# Patient Record
Sex: Male | Born: 1940 | Race: White | Hispanic: No | Marital: Married | Smoking: Former smoker
Health system: Southern US, Community
[De-identification: ages and names within clinical notes are randomized; demographics above are authoritative.]

## PROBLEM LIST (undated history)

## (undated) DIAGNOSIS — G20 Parkinson's disease: Secondary | ICD-10-CM

## (undated) DIAGNOSIS — F32A Depression, unspecified: Secondary | ICD-10-CM

## (undated) DIAGNOSIS — N179 Acute kidney failure, unspecified: Secondary | ICD-10-CM

## (undated) DIAGNOSIS — F17200 Nicotine dependence, unspecified, uncomplicated: Secondary | ICD-10-CM

## (undated) DIAGNOSIS — E1142 Type 2 diabetes mellitus with diabetic polyneuropathy: Secondary | ICD-10-CM

## (undated) DIAGNOSIS — I2510 Atherosclerotic heart disease of native coronary artery without angina pectoris: Secondary | ICD-10-CM

## (undated) DIAGNOSIS — K5100 Ulcerative (chronic) pancolitis without complications: Secondary | ICD-10-CM

## (undated) DIAGNOSIS — I255 Ischemic cardiomyopathy: Secondary | ICD-10-CM

## (undated) DIAGNOSIS — E559 Vitamin D deficiency, unspecified: Secondary | ICD-10-CM

## (undated) DIAGNOSIS — I10 Essential (primary) hypertension: Secondary | ICD-10-CM

## (undated) DIAGNOSIS — A770 Spotted fever due to Rickettsia rickettsii: Secondary | ICD-10-CM

## (undated) DIAGNOSIS — F0391 Unspecified dementia with behavioral disturbance: Secondary | ICD-10-CM

## (undated) DIAGNOSIS — M109 Gout, unspecified: Secondary | ICD-10-CM

## (undated) DIAGNOSIS — I441 Atrioventricular block, second degree: Secondary | ICD-10-CM

## (undated) DIAGNOSIS — N400 Enlarged prostate without lower urinary tract symptoms: Secondary | ICD-10-CM

## (undated) DIAGNOSIS — E669 Obesity, unspecified: Secondary | ICD-10-CM

## (undated) DIAGNOSIS — G4733 Obstructive sleep apnea (adult) (pediatric): Secondary | ICD-10-CM

## (undated) DIAGNOSIS — J9600 Acute respiratory failure, unspecified whether with hypoxia or hypercapnia: Secondary | ICD-10-CM

## (undated) DIAGNOSIS — R251 Tremor, unspecified: Secondary | ICD-10-CM

## (undated) DIAGNOSIS — E1151 Type 2 diabetes mellitus with diabetic peripheral angiopathy without gangrene: Secondary | ICD-10-CM

## (undated) DIAGNOSIS — F319 Bipolar disorder, unspecified: Secondary | ICD-10-CM

## (undated) DIAGNOSIS — E785 Hyperlipidemia, unspecified: Secondary | ICD-10-CM

## (undated) DIAGNOSIS — Z950 Presence of cardiac pacemaker: Secondary | ICD-10-CM

## (undated) DIAGNOSIS — I639 Cerebral infarction, unspecified: Secondary | ICD-10-CM

## (undated) DIAGNOSIS — I25810 Atherosclerosis of coronary artery bypass graft(s) without angina pectoris: Secondary | ICD-10-CM

## (undated) DIAGNOSIS — G8929 Other chronic pain: Secondary | ICD-10-CM

## (undated) DIAGNOSIS — G459 Transient cerebral ischemic attack, unspecified: Secondary | ICD-10-CM

## (undated) DIAGNOSIS — F1011 Alcohol abuse, in remission: Secondary | ICD-10-CM

## (undated) DIAGNOSIS — D126 Benign neoplasm of colon, unspecified: Secondary | ICD-10-CM

## (undated) DIAGNOSIS — I498 Other specified cardiac arrhythmias: Secondary | ICD-10-CM

## (undated) DIAGNOSIS — G47419 Narcolepsy without cataplexy: Secondary | ICD-10-CM

## (undated) DIAGNOSIS — I110 Hypertensive heart disease with heart failure: Secondary | ICD-10-CM

## (undated) DIAGNOSIS — F329 Major depressive disorder, single episode, unspecified: Secondary | ICD-10-CM

## (undated) DIAGNOSIS — I2119 ST elevation (STEMI) myocardial infarction involving other coronary artery of inferior wall: Secondary | ICD-10-CM

## (undated) DIAGNOSIS — G4700 Insomnia, unspecified: Secondary | ICD-10-CM

## (undated) HISTORY — DX: Ischemic cardiomyopathy: I25.5

## (undated) HISTORY — DX: Major depressive disorder, single episode, unspecified: F32.9

## (undated) HISTORY — DX: Parkinson's disease: G20

## (undated) HISTORY — DX: Vitamin D deficiency, unspecified: E55.9

## (undated) HISTORY — DX: Gout, unspecified: M10.9

## (undated) HISTORY — DX: Obesity, unspecified: E66.9

## (undated) HISTORY — DX: Ulcerative (chronic) pancolitis without complications: K51.00

## (undated) HISTORY — DX: Benign prostatic hyperplasia without lower urinary tract symptoms: N40.0

## (undated) HISTORY — DX: Acute kidney failure, unspecified: N17.9

## (undated) HISTORY — DX: Hyperlipidemia, unspecified: E78.5

## (undated) HISTORY — DX: Benign neoplasm of colon, unspecified: D12.6

## (undated) HISTORY — DX: Atherosclerosis of coronary artery bypass graft(s) without angina pectoris: I25.810

## (undated) HISTORY — DX: Insomnia, unspecified: G47.00

## (undated) HISTORY — PX: CARDIAC CATHETERIZATION: SHX172

## (undated) HISTORY — DX: Transient cerebral ischemic attack, unspecified: G45.9

## (undated) HISTORY — DX: Tremor, unspecified: R25.1

## (undated) HISTORY — DX: Obstructive sleep apnea (adult) (pediatric): G47.33

## (undated) HISTORY — DX: Alcohol abuse, in remission: F10.11

## (undated) HISTORY — DX: Other specified cardiac arrhythmias: I49.8

## (undated) HISTORY — DX: Nicotine dependence, unspecified, uncomplicated: F17.200

## (undated) HISTORY — DX: Bipolar disorder, unspecified: F31.9

## (undated) HISTORY — DX: Other chronic pain: G89.29

## (undated) HISTORY — DX: Atherosclerotic heart disease of native coronary artery without angina pectoris: I25.10

## (undated) HISTORY — DX: Type 2 diabetes mellitus with diabetic polyneuropathy: E11.42

## (undated) HISTORY — DX: Essential (primary) hypertension: I10

## (undated) HISTORY — DX: Cerebral infarction, unspecified: I63.9

## (undated) HISTORY — DX: Acute respiratory failure, unspecified whether with hypoxia or hypercapnia: J96.00

## (undated) HISTORY — DX: Type 2 diabetes mellitus with diabetic peripheral angiopathy without gangrene: E11.51

## (undated) HISTORY — DX: Narcolepsy without cataplexy: G47.419

## (undated) HISTORY — DX: Hypertensive heart disease with heart failure: I11.0

## (undated) HISTORY — DX: Presence of cardiac pacemaker: Z95.0

## (undated) HISTORY — PX: CORONARY ARTERY BYPASS GRAFT: SHX141

## (undated) HISTORY — DX: Depression, unspecified: F32.A

## (undated) HISTORY — DX: ST elevation (STEMI) myocardial infarction involving other coronary artery of inferior wall: I21.19

## (undated) HISTORY — DX: Unspecified dementia with behavioral disturbance: F03.91

---
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
## 2004-07-15 ENCOUNTER — Ambulatory Visit: Payer: Self-pay | Admitting: Family Medicine

## 2007-07-31 ENCOUNTER — Encounter: Payer: Self-pay | Admitting: Gastroenterology

## 2007-07-31 ENCOUNTER — Ambulatory Visit: Payer: Self-pay | Admitting: Gastroenterology

## 2007-07-31 DIAGNOSIS — K5120 Ulcerative (chronic) proctitis without complications: Secondary | ICD-10-CM

## 2007-07-31 DIAGNOSIS — F17200 Nicotine dependence, unspecified, uncomplicated: Secondary | ICD-10-CM

## 2007-07-31 HISTORY — DX: Nicotine dependence, unspecified, uncomplicated: F17.200

## 2008-07-03 ENCOUNTER — Emergency Department (HOSPITAL_COMMUNITY): Admission: EM | Admit: 2008-07-03 | Discharge: 2008-07-03 | Payer: Self-pay | Admitting: Emergency Medicine

## 2008-07-15 ENCOUNTER — Ambulatory Visit: Payer: Self-pay | Admitting: Family Medicine

## 2008-07-15 ENCOUNTER — Inpatient Hospital Stay (HOSPITAL_COMMUNITY): Admission: EM | Admit: 2008-07-15 | Discharge: 2008-07-17 | Payer: Self-pay | Admitting: Emergency Medicine

## 2008-07-29 ENCOUNTER — Encounter: Admission: RE | Admit: 2008-07-29 | Discharge: 2008-10-11 | Payer: Self-pay | Admitting: Family Medicine

## 2008-08-11 ENCOUNTER — Ambulatory Visit: Payer: Self-pay

## 2008-08-11 ENCOUNTER — Encounter: Payer: Self-pay | Admitting: Cardiovascular Disease

## 2008-08-11 ENCOUNTER — Encounter (INDEPENDENT_AMBULATORY_CARE_PROVIDER_SITE_OTHER): Payer: Self-pay | Admitting: Neurology

## 2008-08-14 ENCOUNTER — Telehealth: Payer: Self-pay | Admitting: Internal Medicine

## 2008-08-14 ENCOUNTER — Encounter: Payer: Self-pay | Admitting: Internal Medicine

## 2008-09-01 DIAGNOSIS — E669 Obesity, unspecified: Secondary | ICD-10-CM

## 2008-09-01 DIAGNOSIS — I110 Hypertensive heart disease with heart failure: Secondary | ICD-10-CM

## 2008-09-01 DIAGNOSIS — E1151 Type 2 diabetes mellitus with diabetic peripheral angiopathy without gangrene: Secondary | ICD-10-CM

## 2008-09-01 DIAGNOSIS — G4730 Sleep apnea, unspecified: Secondary | ICD-10-CM | POA: Insufficient documentation

## 2008-09-01 HISTORY — DX: Obesity, unspecified: E66.9

## 2008-09-01 HISTORY — DX: Type 2 diabetes mellitus with diabetic peripheral angiopathy without gangrene: E11.51

## 2008-09-01 HISTORY — DX: Hypertensive heart disease with heart failure: I11.0

## 2008-09-02 ENCOUNTER — Ambulatory Visit: Payer: Self-pay | Admitting: Internal Medicine

## 2008-09-02 DIAGNOSIS — I498 Other specified cardiac arrhythmias: Secondary | ICD-10-CM

## 2008-09-02 HISTORY — DX: Other specified cardiac arrhythmias: I49.8

## 2008-09-09 ENCOUNTER — Telehealth (INDEPENDENT_AMBULATORY_CARE_PROVIDER_SITE_OTHER): Payer: Self-pay

## 2008-09-10 ENCOUNTER — Ambulatory Visit: Payer: Self-pay

## 2008-09-10 ENCOUNTER — Encounter: Payer: Self-pay | Admitting: Cardiovascular Disease

## 2008-09-16 ENCOUNTER — Ambulatory Visit: Payer: Self-pay | Admitting: Pulmonary Disease

## 2008-09-16 DIAGNOSIS — G4733 Obstructive sleep apnea (adult) (pediatric): Secondary | ICD-10-CM

## 2008-09-16 HISTORY — DX: Obstructive sleep apnea (adult) (pediatric): G47.33

## 2008-09-18 ENCOUNTER — Telehealth: Payer: Self-pay | Admitting: Internal Medicine

## 2008-09-19 ENCOUNTER — Telehealth: Payer: Self-pay | Admitting: Cardiovascular Disease

## 2008-09-24 ENCOUNTER — Telehealth (INDEPENDENT_AMBULATORY_CARE_PROVIDER_SITE_OTHER): Payer: Self-pay | Admitting: *Deleted

## 2008-09-24 ENCOUNTER — Telehealth: Payer: Self-pay | Admitting: Internal Medicine

## 2008-09-30 ENCOUNTER — Telehealth (INDEPENDENT_AMBULATORY_CARE_PROVIDER_SITE_OTHER): Payer: Self-pay | Admitting: *Deleted

## 2008-09-30 DIAGNOSIS — I2119 ST elevation (STEMI) myocardial infarction involving other coronary artery of inferior wall: Secondary | ICD-10-CM

## 2008-09-30 HISTORY — DX: ST elevation (STEMI) myocardial infarction involving other coronary artery of inferior wall: I21.19

## 2008-10-01 ENCOUNTER — Encounter: Payer: Self-pay | Admitting: Pulmonary Disease

## 2008-10-01 ENCOUNTER — Ambulatory Visit (HOSPITAL_BASED_OUTPATIENT_CLINIC_OR_DEPARTMENT_OTHER): Admission: RE | Admit: 2008-10-01 | Discharge: 2008-10-01 | Payer: Self-pay | Admitting: Pulmonary Disease

## 2008-10-01 ENCOUNTER — Telehealth: Payer: Self-pay | Admitting: Internal Medicine

## 2008-10-07 ENCOUNTER — Inpatient Hospital Stay (HOSPITAL_COMMUNITY): Admission: EM | Admit: 2008-10-07 | Discharge: 2008-10-10 | Payer: Self-pay | Admitting: Emergency Medicine

## 2008-10-07 ENCOUNTER — Ambulatory Visit: Payer: Self-pay | Admitting: Cardiology

## 2008-10-09 ENCOUNTER — Encounter: Payer: Self-pay | Admitting: Cardiology

## 2008-10-10 ENCOUNTER — Encounter: Payer: Self-pay | Admitting: Surgery

## 2008-10-10 ENCOUNTER — Ambulatory Visit: Payer: Self-pay | Admitting: Surgery

## 2008-10-12 ENCOUNTER — Ambulatory Visit: Payer: Self-pay | Admitting: Pulmonary Disease

## 2008-10-13 ENCOUNTER — Telehealth (INDEPENDENT_AMBULATORY_CARE_PROVIDER_SITE_OTHER): Payer: Self-pay | Admitting: *Deleted

## 2008-10-15 ENCOUNTER — Ambulatory Visit: Payer: Self-pay | Admitting: Internal Medicine

## 2008-10-15 ENCOUNTER — Inpatient Hospital Stay (HOSPITAL_COMMUNITY): Admission: RE | Admit: 2008-10-15 | Discharge: 2008-10-21 | Payer: Self-pay | Admitting: Surgery

## 2008-10-28 ENCOUNTER — Encounter: Payer: Self-pay | Admitting: Cardiovascular Disease

## 2008-10-28 ENCOUNTER — Ambulatory Visit: Payer: Self-pay | Admitting: Surgery

## 2008-10-29 ENCOUNTER — Encounter: Payer: Self-pay | Admitting: Cardiovascular Disease

## 2008-11-11 ENCOUNTER — Encounter: Admission: RE | Admit: 2008-11-11 | Discharge: 2008-11-11 | Payer: Self-pay | Admitting: Surgery

## 2008-11-14 ENCOUNTER — Ambulatory Visit: Payer: Self-pay | Admitting: Surgery

## 2008-11-14 ENCOUNTER — Ambulatory Visit: Payer: Self-pay | Admitting: Pulmonary Disease

## 2008-11-17 ENCOUNTER — Encounter (INDEPENDENT_AMBULATORY_CARE_PROVIDER_SITE_OTHER): Payer: Self-pay | Admitting: *Deleted

## 2008-11-18 ENCOUNTER — Ambulatory Visit: Payer: Self-pay | Admitting: Cardiovascular Disease

## 2008-11-18 DIAGNOSIS — I25810 Atherosclerosis of coronary artery bypass graft(s) without angina pectoris: Secondary | ICD-10-CM

## 2008-11-18 DIAGNOSIS — I25709 Atherosclerosis of coronary artery bypass graft(s), unspecified, with unspecified angina pectoris: Secondary | ICD-10-CM | POA: Insufficient documentation

## 2008-11-18 HISTORY — DX: Atherosclerosis of coronary artery bypass graft(s) without angina pectoris: I25.810

## 2008-12-09 ENCOUNTER — Encounter: Payer: Self-pay | Admitting: Cardiovascular Disease

## 2009-03-16 ENCOUNTER — Inpatient Hospital Stay (HOSPITAL_COMMUNITY): Admission: EM | Admit: 2009-03-16 | Discharge: 2009-03-18 | Payer: Self-pay | Admitting: Emergency Medicine

## 2009-03-16 ENCOUNTER — Ambulatory Visit: Payer: Self-pay | Admitting: Cardiovascular Disease

## 2009-03-18 ENCOUNTER — Encounter (INDEPENDENT_AMBULATORY_CARE_PROVIDER_SITE_OTHER): Payer: Self-pay | Admitting: Internal Medicine

## 2009-04-09 ENCOUNTER — Ambulatory Visit (HOSPITAL_COMMUNITY): Admission: RE | Admit: 2009-04-09 | Discharge: 2009-04-09 | Payer: Self-pay | Admitting: Neurology

## 2009-04-09 ENCOUNTER — Ambulatory Visit: Payer: Self-pay | Admitting: Vascular Surgery

## 2009-04-09 ENCOUNTER — Encounter (INDEPENDENT_AMBULATORY_CARE_PROVIDER_SITE_OTHER): Payer: Self-pay | Admitting: Neurology

## 2009-04-13 ENCOUNTER — Encounter: Admission: RE | Admit: 2009-04-13 | Discharge: 2009-07-12 | Payer: Self-pay | Admitting: Family

## 2009-05-19 ENCOUNTER — Ambulatory Visit: Payer: Self-pay | Admitting: Cardiovascular Disease

## 2009-05-19 DIAGNOSIS — I2589 Other forms of chronic ischemic heart disease: Secondary | ICD-10-CM

## 2009-06-05 ENCOUNTER — Encounter: Payer: Self-pay | Admitting: Cardiovascular Disease

## 2009-06-05 ENCOUNTER — Ambulatory Visit: Payer: Self-pay | Admitting: Cardiology

## 2009-06-05 ENCOUNTER — Ambulatory Visit (HOSPITAL_COMMUNITY): Admission: RE | Admit: 2009-06-05 | Discharge: 2009-06-05 | Payer: Self-pay | Admitting: Cardiovascular Disease

## 2009-06-05 ENCOUNTER — Ambulatory Visit: Payer: Self-pay

## 2009-06-05 ENCOUNTER — Encounter (INDEPENDENT_AMBULATORY_CARE_PROVIDER_SITE_OTHER): Payer: Self-pay | Admitting: *Deleted

## 2009-08-02 ENCOUNTER — Inpatient Hospital Stay (HOSPITAL_COMMUNITY): Admission: EM | Admit: 2009-08-02 | Discharge: 2009-08-04 | Payer: Self-pay | Admitting: Emergency Medicine

## 2009-08-02 ENCOUNTER — Ambulatory Visit: Payer: Self-pay | Admitting: Cardiovascular Disease

## 2009-08-03 ENCOUNTER — Encounter (INDEPENDENT_AMBULATORY_CARE_PROVIDER_SITE_OTHER): Payer: Self-pay | Admitting: Internal Medicine

## 2009-08-17 ENCOUNTER — Telehealth: Payer: Self-pay | Admitting: Cardiovascular Disease

## 2009-08-18 ENCOUNTER — Encounter: Payer: Self-pay | Admitting: Cardiovascular Disease

## 2009-11-24 ENCOUNTER — Ambulatory Visit: Payer: Self-pay | Admitting: Cardiovascular Disease

## 2009-11-24 ENCOUNTER — Encounter: Payer: Self-pay | Admitting: Cardiovascular Disease

## 2009-11-25 ENCOUNTER — Ambulatory Visit: Payer: Self-pay | Admitting: Cardiovascular Disease

## 2009-11-25 DIAGNOSIS — R3919 Other difficulties with micturition: Secondary | ICD-10-CM | POA: Insufficient documentation

## 2009-11-25 LAB — CONVERTED CEMR LAB
ALT: 39 units/L (ref 0–53)
CO2: 24 meq/L (ref 19–32)
Calcium: 10.5 mg/dL (ref 8.4–10.5)
Direct LDL: 66.1 mg/dL
HDL: 35.5 mg/dL — ABNORMAL LOW (ref >39.00)
Hemoglobin, Urine: NEGATIVE
Nitrite: NEGATIVE
Potassium: 4.2 meq/L (ref 3.5–5.1)
Sodium: 139 meq/L (ref 135–145)
Specific Gravity, Urine: >=1.03 (ref 1.000–1.030)
Total Bilirubin: 0.7 mg/dL (ref 0.3–1.2)
Total Protein: 6.6 g/dL (ref 6.0–8.3)
Triglycerides: 408 mg/dL — ABNORMAL HIGH (ref 0.0–149.0)
VLDL: 81.6 mg/dL — ABNORMAL HIGH (ref 0.0–40.0)
pH: 5 (ref 5.0–8.0)

## 2009-12-29 ENCOUNTER — Emergency Department (HOSPITAL_COMMUNITY)
Admission: EM | Admit: 2009-12-29 | Discharge: 2009-12-29 | Payer: Self-pay | Source: Home / Self Care | Admitting: Emergency Medicine

## 2010-02-16 NOTE — Progress Notes (Signed)
Summary: Need laetter tp have teeth pulled and to stop Plavix   Phone Note Call from Patient Call back at Home Phone [phone]   Caller: Patient Summary of Call: Pt need a letter stating he can have teeth pulled and need to know anout stop his Plavix Initial call taken by: Judie Grieve,  August 17, 2009 10:06 AM  Follow-up for Phone Call        Spoke with pt's wife. [name] is going to [facility] to have 11+ teeth pulled.  It is a discount clinic.  They don't take appoinments, you get there at 6am and it's a first come first serve basis.  He is trying to go ASAP and needs a letter stating that he is able to have teeth extracted and if he can hold ASA.  He takes 81mg  ASA daily.  Dr Sandria Manly is addressing the Plavix.  Pt aware Dr Clifton James not in office until 08/18/09. Will foward to him for review.  Dennis Bast, RN, BSN  August 17, 2009 10:21 AM  Additional Follow-up for Phone Call Additional follow up Details #1::        Letter completed and in EMR. cdm Additional Follow-up by: Verne Carrow, MD,  August 18, 2009 8:38 AM    Additional Follow-up for Phone Call Additional follow up Details #2::    left message to call back. Dossie Arbour, RN, BSN  August 18, 2009 2:10 PM Wife notified that Dr.McAlhany has written a letter stating that pt may hold aspirin and plavix for dental procedure. She will come to office to pick up letter. Letter left at front desk Follow-up by: Dossie Arbour, RN, BSN,  August 18, 2009 3:43 PM

## 2010-02-16 NOTE — Assessment & Plan Note (Signed)
Summary: per check out/sf  Medications Added CARVEDILOL 3.125 MG TABS (CARVEDILOL) 1 tab two times a day CARVEDILOL 6.25 MG TABS (CARVEDILOL) Take one tablet by mouth twice a day BENICAR 20 MG TABS (OLMESARTAN MEDOXOMIL) 1 tab once daily TOPAMAX 100 MG TABS (TOPIRAMATE) 1 tab once daily      Allergies Added: NKDA  Visit Type:  Follow-up Referring Provider:  Avie Echevaria Primary Provider:  Feliciana Rossetti  CC:  dizziness ....RUQ CP.  History of Present Illness: 70 yo WM with history of CAD s/p 4V CABG 10/15/08, HTN, DM, BPH with  admission to [facility] 10/08/08 with chest pain.  He underwent 4 V CABG on 10/15/08. He had 1 single episode of chest pain in August 2010 that was mild.  He underwent cardiac catheterization, which showed occlusion of the proximal LAD with faint filling of distal vessel by left-to-left collaterals.  There was about 70% stenosis of the diagonal, 70%-80% left circumflex, and no significant stenosis in the right coronary artery, but it was somewhat ectatic and diffusely diseased.  Ejection fraction was 50% with mild anteroapical  hypokinesis.  He then underwent a cardiac MRI to assess viability of his anterior wall, which showed normal left ventricular size with mildly decreased systolic function and an ejection fraction of 53% with mild anteroseptal hypokinesis. 4V CABG went well.   He is here today for follow up. He has had occasional sharp mild  chest pains in the right chest. These are at rest. No severe chest pain. No change in in his breathing since last visit. Mild dizziness but no near  syncope or syncope. He has not been walking or exercising.   He has overall poor energy level. His main complaint is of pain in both feet secondary to neuropathy. He has had prior normal arterial dopplers in lower ext. He also has foul smelling urine. No dysuria.   Current Medications (verified): 1)  Flomax 0.4 Mg  Cp24 (Tamsulosin Hcl) .... Take 1 At Bedtime 2)  Glyburide 5 Mg  Tabs  (Glyburide) .[name] Kitchen.. 1 Tab By Mouth Three Times A Day 3)  Metformin Hcl 850 Mg Tabs (Metformin Hcl) .... Three Times A Day 4)  Cyanocobalamin 1000 Mcg/ml Soln (Cyanocobalamin) .[name] Kitchen.. 1cc Each Week 5)  Finasteride 5 Mg Tabs (Finasteride) .... Once Daily 6)  Plavix 75 Mg Tabs (Clopidogrel Bisulfate) .... Once Daily 7)  Carvedilol 3.125 Mg Tabs (Carvedilol) .[name] Kitchen.. 1 Tab Two Times A Day 8)  Aspirin Ec Low Dose 81 Mg Tbec (Aspirin) .... Take 1 Tablet By Mouth Once A Day 9)  Simvastatin 40 Mg Tabs (Simvastatin) .... Take 1 Tablet By Mouth Once A Day 10)  B 12 Shot .[name] Kitchen.. 1cc Wkly 11)  Benicar 20 Mg Tabs (Olmesartan Medoxomil) .[name] Kitchen.. 1 Tab Once Daily 12)  Topamax 100 Mg Tabs (Topiramate) .[name] Kitchen.. 1 Tab Once Daily  Allergies (verified): No Known Drug Allergies  Past History:  Past Medical History: CAD-s/p 4V CABG 10/15/08 HYPERTENSION (ICD-401.9) OBESITY (ICD-278.00) DM (ICD-250.00) TOBACCO ABUSE (ICD-305.1)  ULCERATIVE COLITIS (ICD-556.6) Obstructive sleep apnea Syncope BPH  Social History: Reviewed history from 11/18/2008 and no changes required. Married, 2 children Occupation: retired-ran Systems analyst Patient smoked 2ppd for 50 years but stopped in 2010 Alcohol Use - no Daily Caffeine Use 2 per day Illicit Drug Use - no  Review of Systems       The patient complains of chest pain and dizziness.  The patient denies fatigue, malaise, fever, weight gain/loss, vision loss, decreased hearing, hoarseness, palpitations, shortness of  breath, prolonged cough, wheezing, sleep apnea, coughing up blood, abdominal pain, blood in stool, nausea, vomiting, diarrhea, heartburn, incontinence, blood in urine, muscle weakness, joint pain, leg swelling, rash, skin lesions, headache, fainting, depression, anxiety, enlarged lymph nodes, easy bruising or bleeding, and environmental allergies.    Vital Signs:  Patient profile:   70 year old male Height:      77 inches Weight:      245 pounds BMI:     29.16 Pulse rate:    84 / minute Pulse rhythm:   irregular BP sitting:   110 / 76  (left arm) Cuff size:   large  Vitals Entered By: Danielle Rankin, CMA (November 24, 2009 11:43 AM)  Physical Exam  General:  General: Well developed, well nourished, NAD HEENT: OP clear, mucus membranes moist Musculoskeletal: Muscle strength 5/5 all ext Psychiatric: Mood and affect normal Neck: No JVD, no carotid bruits, no thyromegaly, no lymphadenopathy. Lungs:Clear bilaterally, no wheezes, rhonci, crackles Chest wall with well healing sternal wound CV: RRR no murmurs, gallops rubs Abdomen: soft, NT, ND, BS present Extremities:Trace to 1+  edema right leg, Trace left leg, pulses difficult to palpate. Decreased sensation both feet.      Echocardiogram  Procedure date:  06/05/2009  Findings:      Left ventricle: Technically very difficult study. There is       hypokinesis of the inferior wall. I believe the overall EF is in       the 40% range. The cavity size was normal. Wall thickness was       increased in a pattern of mild LVH. Doppler parameters are       consistent with abnormal left ventricular relaxation (grade 1       diastolic dysfunction).     - Right ventricle: The cavity size was normal. Systolic function was       normal.     - Right atrium: The atrium was mildly dilated.     - Tricuspid valve: No regurgitation. RV pressure can not be       estimated.  Impression & Recommendations:  Problem # 1:  CAD, ARTERY BYPASS GRAFT (ICD-414.04) Stable angina. Will increase Coreg to 6.25mg  by mouth two times a day. Continue ASA/Plavix, ARB and statin. Will check fasting lipids/BMET/LFTs and U/A in am.    The following medications were removed from the medication list:    Benazepril Hcl 10 Mg Tabs (Benazepril hcl) .[name] Kitchen... 1/2 tab by mouth two times a day His updated medication list for this problem includes:    Plavix 75 Mg Tabs (Clopidogrel bisulfate) ..... Once daily    Carvedilol 6.25 Mg Tabs (Carvedilol)  .[name] Kitchen... Take one tablet by mouth twice a day    Aspirin Ec Low Dose 81 Mg Tbec (Aspirin) .[name] Kitchen... Take 1 tablet by mouth once a day  Orders: EKG w/ Interpretation (93000)  Problem # 2:  CARDIOMYOPATHY, ISCHEMIC (ICD-414.8) Volume ok. Continue current meds.   The following medications were removed from the medication list:    Benazepril Hcl 10 Mg Tabs (Benazepril hcl) .[name] Kitchen... 1/2 tab by mouth two times a day His updated medication list for this problem includes:    Plavix 75 Mg Tabs (Clopidogrel bisulfate) ..... Once daily    Carvedilol 6.25 Mg Tabs (Carvedilol) .[name] Kitchen... Take one tablet by mouth twice a day    Aspirin Ec Low Dose 81 Mg Tbec (Aspirin) .[name] Kitchen... Take 1 tablet by mouth once a day    Benicar 20 Mg Tabs (  Olmesartan medoxomil) .[name] Kitchen... 1 tab once daily  The following medications were removed from the medication list:    Benazepril Hcl 10 Mg Tabs (Benazepril hcl) .[name] Kitchen... 1/2 tab by mouth two times a day His updated medication list for this problem includes:    Plavix 75 Mg Tabs (Clopidogrel bisulfate) ..... Once daily    Carvedilol 6.25 Mg Tabs (Carvedilol) .[name] Kitchen... Take one tablet by mouth twice a day    Aspirin Ec Low Dose 81 Mg Tbec (Aspirin) .[name] Kitchen... Take 1 tablet by mouth once a day    Benicar 20 Mg Tabs (Olmesartan medoxomil) .[name] Kitchen... 1 tab once daily  Problem # 3:  HYPERTENSION (ICD-401.9) BP well controlled. Continue curent meds.   The following medications were removed from the medication list:    Benazepril Hcl 10 Mg Tabs (Benazepril hcl) .[name] Kitchen... 1/2 tab by mouth two times a day His updated medication list for this problem includes:    Carvedilol 6.25 Mg Tabs (Carvedilol) .[name] Kitchen... Take one tablet by mouth twice a day    Aspirin Ec Low Dose 81 Mg Tbec (Aspirin) .[name] Kitchen... Take 1 tablet by mouth once a day    Benicar 20 Mg Tabs (Olmesartan medoxomil) .[name] Kitchen... 1 tab once daily  Patient Instructions: 1)  Your physician recommends that you schedule a follow-up appointment in 6 months. 2)  Your physician  has recommended you make the following change in your medication: INCREASE your Carvedilol to 6.25 mg by mouth two times a day. 3)  Your physician recommends that you return for a FASTING lipid profile, liver function test, BMP, and urinalysis.  Prescriptions: CARVEDILOL 6.25 MG TABS (CARVEDILOL) Take one tablet by mouth twice a day  #30 x 8   Entered by:   Whitney Maeola Sarah RN   Authorized by:   Verne Carrow, MD   Signed by:   Ellender Hose RN on 11/24/2009   Method used:   [facility]ronically to        [facility]       [address]       [place], [place]  [address]       Ph: [phone]       Fax: [phone]   RxID:   [id number]

## 2010-02-16 NOTE — Letter (Signed)
Summary: Outpatient Coinsurance Notice  Outpatient Coinsurance Notice   Imported By: Marylou Mccoy 06/08/2009 16:52:20  _____________________________________________________________________  External Attachment:    Type:   Image     Comment:   External Document

## 2010-02-16 NOTE — Assessment & Plan Note (Signed)
Summary: per chec out/sf  Medications Added CARVEDILOL 6.25 MG TABS (CARVEDILOL) Take one tablet by mouth twice a day TESTOSTERONE CYPIONATE 100 MG/ML OIL (TESTOSTERONE CYPIONATE) 2cc wkly * B 12 SHOT 1cc wkly      Allergies Added: NKDA  Visit Type:  Follow-up Referring Provider:  Avie Echevaria Primary Provider:  Feliciana Rossetti  CC:  Foot pain.  History of Present Illness: 70 yo WM with history of CAD s/p 4V CABG 10/15/08, HTN, DM, BPH with  admission to [facility] 10/08/08 with chest pain.  He underwent 4 V CABG on 10/15/08. He had 1 single episode of chest pain in August 2010 that was mild.  He underwent cardiac catheterization, which showed occlusion of the proximal LAD with faint filling of distal vessel by left-to-left collaterals.  There was about 70% stenosis of the diagonal, 70%-80% left circumflex, and no significant stenosis in the right coronary artery, but it was somewhat ectatic and diffusely diseased.  Ejection fraction was 50% with mild anteroapical  hypokinesis.  He then underwent a cardiac MRI to assess viability of his anterior wall, which showed normal left ventricular size with mildly decreased systolic function and an ejection fraction of 53% with mild anteroseptal hypokinesis. 4V CABG went well.   He is here today for follow up. He has had no chest pain, SOB or palpitations. He has overall poor energly level. His main complaint is of pain in both feet secondary to neuropathy. He has had prior normal arterial dopplers in lower ext. He was taken to the hospital 3 months ago with generalized weakness and slurred speech and was told that he had a TIA.   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 Take 1 At Bedtime 2)  Glyburide 5 Mg  Tabs (Glyburide) .[name] Kitchen.. 1 Tab By Mouth Three Times A Day 3)  Metformin Hcl 850 Mg Tabs (Metformin Hcl) .... Three Times A Day 4)  Cyanocobalamin 1000 Mcg/ml Soln (Cyanocobalamin) .[name] Kitchen.. 1cc Each Week 5)  Depo-Testosterone 100 Mg/ml Oil (Testosterone Cypionate) .... 2 Cc Weekly 6)  Finasteride 5 Mg Tabs (Finasteride) .... Once Daily 7)  Divalproex Sodium 125 Mg Tbec (Divalproex Sodium) .... Two Times A Day 8)  Plavix 75 Mg Tabs (Clopidogrel Bisulfate) .... Once Daily 9)  Coreg 3.125 Mg Tabs (Carvedilol) .... Take One Tablet Twice Daily. 10)  Benazepril Hcl 10 Mg Tabs (Benazepril Hcl) .... 1/2 Tab By Mouth Two Times A Day 11)  Aspirin Ec Low Dose 81 Mg Tbec (Aspirin) .... Take 1 Tablet By Mouth Once A Day 12)  Simvastatin 40 Mg Tabs (Simvastatin) .... Take 1 Tablet By Mouth Once A Day 13)  Testosterone Cypionate 100 Mg/ml Oil (Testosterone Cypionate) .... 2cc Wkly 14)  B 12 Shot .[name] Kitchen.. 1cc Wkly  Allergies (verified): No Known Drug Allergies  Past History:  Past Medical History: Reviewed history from 11/18/2008 and no changes required. CAD-s/p 4V CABG  HYPERTENSION (ICD-401.9) OBESITY (ICD-278.00) DM (ICD-250.00) TOBACCO ABUSE (ICD-305.1)  ULCERATIVE COLITIS (ICD-556.6) Obstructive sleep apnea  Syncope BPH  Social History: Reviewed history from 11/18/2008 and no changes required. 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  The patient denies malaise, fever, weight gain/loss, vision loss, decreased hearing,  hoarseness, chest pain, palpitations, shortness of breath, prolonged cough, wheezing, sleep apnea, coughing up blood, abdominal pain, blood in stool, nausea, vomiting, diarrhea, heartburn, blood in urine, muscle weakness, leg swelling, rash, skin lesions, headache, fainting, dizziness, depression, anxiety, enlarged lymph nodes, easy bruising or bleeding, and environmental allergies.    Vital Signs:  Patient profile:   70 year old male Height:      77 inches Weight:      252 pounds BMI:     29.99 Pulse rate:   81 / minute BP sitting:   155 / 96  (left arm) Cuff size:   large  Vitals Entered By: Oswald Hillock (May 19, 2009 8:48 AM)  Physical Exam  General:  General: Well developed, well nourished, NAD HEENT: OP clear, mucus membranes moist Musculoskeletal: Muscle strength 5/5 all ext Psychiatric: Mood and affect normal Neck: No JVD, no carotid bruits, no thyromegaly, no lymphadenopathy. Lungs:Clear bilaterally, no wheezes, rhonci, crackles Chest wall with well healing sternal wound CV: RRR no murmurs, gallops rubs Abdomen: soft, NT, ND, BS present Extremities:Trace to 1+  edema right leg, Trace left leg, pulses difficult to palpate. Decreased sensation both feet.    Impression & Recommendations:  Problem # 1:  CAD, ARTERY BYPASS GRAFT (ICD-414.04) Stable. Will increase Coreg to 6.25 mg by mouth two times a day. Continue ASA and Plavix for now.  Repeat echo in next several weeks.  His updated medication list for this problem includes:    Plavix 75 Mg Tabs (Clopidogrel bisulfate) ..... Once daily    Carvedilol 6.25 Mg Tabs (Carvedilol) .[name] Kitchen... Take one tablet by mouth twice a day    Benazepril Hcl 10 Mg Tabs (Benazepril hcl) .[name] Kitchen... 1/2 tab by mouth two times a day    Aspirin Ec Low Dose 81 Mg Tbec (Aspirin) .[name] Kitchen... Take 1 tablet by mouth once a day  Orders: Echocardiogram (Echo)  Problem # 2:  HYPERTENSION (ICD-401.9) Increase Coreg to 6.25 mg by mouth two times a day.   The  following medications were removed from the medication list:    Furosemide 40 Mg Tabs (Furosemide) .[name] Kitchen... 1 tab by mouth once daily His updated medication list for this problem includes:    Carvedilol 6.25 Mg Tabs (Carvedilol) .[name] Kitchen... Take one tablet by mouth twice a day    Benazepril Hcl 10 Mg Tabs (Benazepril hcl) .[name] Kitchen... 1/2 tab by mouth two times a day    Aspirin Ec Low Dose 81 Mg Tbec (Aspirin) .[name] Kitchen... Take 1 tablet by mouth once a day  Orders: Echocardiogram (Echo)  Problem # 3:  CARDIOMYOPATHY, ISCHEMIC (ICD-414.8) Volume status ok. On low dose Lasix. Continue Ace-inhibitor, beta blocker, statin, ASA.   The following medications were removed from the medication list:    Furosemide 40 Mg Tabs (Furosemide) .[name] Kitchen... 1 tab by mouth once daily His updated medication list for this problem includes:    Plavix 75 Mg Tabs (Clopidogrel bisulfate) ..... Once daily    Carvedilol 6.25 Mg Tabs (Carvedilol) .[name] Kitchen... Take one tablet by mouth twice a day  Benazepril Hcl 10 Mg Tabs (Benazepril hcl) .[name] Kitchen... 1/2 tab by mouth two times a day    Aspirin Ec Low Dose 81 Mg Tbec (Aspirin) .[name] Kitchen... Take 1 tablet by mouth once a day  Patient Instructions: 1)  Your physician recommends that you schedule a follow-up appointment in: 6 months 2)  Your physician has recommended you make the following change in your medication: Increase carvedilol to 6.25 mg by mouth two times a day 3)  Your physician has requested that you have an echocardiogram.  Echocardiography is a painless test that uses sound waves to create images of your heart. It provides your doctor with information about the size and shape of your heart and how well your heart's chambers and valves are working.  This procedure takes approximately one hour. There are no restrictions for this procedure. Prescriptions: CARVEDILOL 6.25 MG TABS (CARVEDILOL) Take one tablet by mouth twice a day  #60 x 11   Entered by:   Dossie Arbour, RN, BSN   Authorized by:    Verne Carrow, MD   Signed by:   Dossie Arbour, RN, BSN on 05/19/2009   Method used:   Electronically to        [facility] (retail)       [address]       [place], [place]  [address]       Ph: [phone]       Fax: [phone]   RxID:   [id number]

## 2010-02-16 NOTE — Letter (Signed)
Summary: Generic Letter  [facility], Main Office  [address]   [place], [place] [address]   Phone: [phone]  Fax: [phone]    08/18/2009  [facility] [address] [place], [place]  [address]  Dear Mr. Heberlein,   To whom it may concern,  Mr. Marcella Dubs may hold his Aspirin and Plavix for the planned dental procedure.    Sincerely,   Verne Carrow, MD

## 2010-04-03 LAB — BASIC METABOLIC PANEL
BUN: 17 mg/dL (ref 6–23)
CO2: 22 mEq/L (ref 19–32)
Calcium: 8 mg/dL — ABNORMAL LOW (ref 8.4–10.5)
Calcium: 9 mg/dL (ref 8.4–10.5)
Creatinine, Ser: 0.96 mg/dL (ref 0.4–1.5)
GFR calc Af Amer: >60 mL/min (ref >60)
Glucose, Bld: 251 mg/dL — ABNORMAL HIGH (ref 70–99)
Potassium: 3.2 mEq/L — ABNORMAL LOW (ref 3.5–5.1)
Sodium: 137 mEq/L (ref 135–145)

## 2010-04-03 LAB — URINE CULTURE: Colony Count: NO GROWTH

## 2010-04-03 LAB — GLUCOSE, CAPILLARY
Glucose-Capillary: 215 mg/dL — ABNORMAL HIGH (ref 70–99)
Glucose-Capillary: 243 mg/dL — ABNORMAL HIGH (ref 70–99)
Glucose-Capillary: 248 mg/dL — ABNORMAL HIGH (ref 70–99)
Glucose-Capillary: 304 mg/dL — ABNORMAL HIGH (ref 70–99)

## 2010-04-03 LAB — POCT CARDIAC MARKERS
CKMB, poc: 32.6 ng/mL (ref 1.0–8.0)
Troponin i, poc: <0.05 ng/mL (ref 0.00–0.09)

## 2010-04-03 LAB — CBC
HCT: 39.9 % (ref 39.0–52.0)
Hemoglobin: 13.8 g/dL (ref 13.0–17.0)
MCH: 32.6 pg (ref 26.0–34.0)
MCHC: 34.6 g/dL (ref 30.0–36.0)
MCHC: 34.7 g/dL (ref 30.0–36.0)
MCV: 94.7 fL (ref 78.0–100.0)
Platelets: 188 10*3/uL (ref 150–400)
RDW: 14.2 % (ref 11.5–15.5)
WBC: 8 10*3/uL (ref 4.0–10.5)

## 2010-04-03 LAB — COMPREHENSIVE METABOLIC PANEL
Albumin: 3.3 g/dL — ABNORMAL LOW (ref 3.5–5.2)
Alkaline Phosphatase: 37 U/L — ABNORMAL LOW (ref 39–117)
BUN: 18 mg/dL (ref 6–23)
Calcium: 8.9 mg/dL (ref 8.4–10.5)
Creatinine, Ser: 0.99 mg/dL (ref 0.4–1.5)
Potassium: 3.9 mEq/L (ref 3.5–5.1)
Total Protein: 6.5 g/dL (ref 6.0–8.3)

## 2010-04-03 LAB — POCT I-STAT 3, VENOUS BLOOD GAS (G3P V)
Acid-base deficit: 5 mmol/L — ABNORMAL HIGH (ref 0.0–2.0)
Bicarbonate: 19.2 mEq/L — ABNORMAL LOW (ref 20.0–24.0)
Patient temperature: 98.2

## 2010-04-03 LAB — URINALYSIS, ROUTINE W REFLEX MICROSCOPIC
Glucose, UA: 250 mg/dL — AB
Ketones, ur: 15 mg/dL — AB
Leukocytes, UA: NEGATIVE
pH: 6.5 (ref 5.0–8.0)

## 2010-04-03 LAB — LIPID PANEL
HDL: 43 mg/dL (ref >39)
LDL Cholesterol: 49 mg/dL (ref 0–99)
Triglycerides: 167 mg/dL — ABNORMAL HIGH (ref <150)
VLDL: 33 mg/dL (ref 0–40)

## 2010-04-03 LAB — DIFFERENTIAL
Basophils Relative: 1 % (ref 0–1)
Eosinophils Absolute: 0 10*3/uL (ref 0.0–0.7)
Monocytes Absolute: 0.9 10*3/uL (ref 0.1–1.0)
Monocytes Relative: 9 % (ref 3–12)

## 2010-04-03 LAB — RAPID URINE DRUG SCREEN, HOSP PERFORMED
Barbiturates: NOT DETECTED
Opiates: POSITIVE — AB
Tetrahydrocannabinol: NOT DETECTED

## 2010-04-03 LAB — CARDIAC PANEL(CRET KIN+CKTOT+MB+TROPI)
Total CK: 1033 U/L — ABNORMAL HIGH (ref 7–232)
Total CK: 2870 U/L — ABNORMAL HIGH (ref 7–232)
Troponin I: 0.01 ng/mL (ref 0.00–0.06)
Troponin I: 0.02 ng/mL (ref 0.00–0.06)
Troponin I: 0.03 ng/mL (ref 0.00–0.06)

## 2010-04-03 LAB — CK TOTAL AND CKMB (NOT AT ARMC)
CK, MB: 20.6 ng/mL (ref 0.3–4.0)
Relative Index: 1.3 (ref 0.0–2.5)

## 2010-04-03 LAB — TSH: TSH: 0.634 u[IU]/mL (ref 0.350–4.500)

## 2010-04-03 LAB — VALPROIC ACID LEVEL: Valproic Acid Lvl: <10 ug/mL — ABNORMAL LOW (ref 50.0–100.0)

## 2010-04-03 LAB — URIC ACID: Uric Acid, Serum: 6 mg/dL (ref 4.0–7.8)

## 2010-04-03 LAB — URINE MICROSCOPIC-ADD ON

## 2010-04-07 LAB — CBC
HCT: 41.3 % (ref 39.0–52.0)
Hemoglobin: 14.2 g/dL (ref 13.0–17.0)
RBC: 4.51 MIL/uL (ref 4.22–5.81)
RDW: 16.4 % — ABNORMAL HIGH (ref 11.5–15.5)

## 2010-04-07 LAB — DIFFERENTIAL
Basophils Absolute: 0 10*3/uL (ref 0.0–0.1)
Basophils Relative: 0 % (ref 0–1)
Eosinophils Absolute: 0.3 10*3/uL (ref 0.0–0.7)
Eosinophils Relative: 5 % (ref 0–5)
Monocytes Absolute: 0.6 10*3/uL (ref 0.1–1.0)
Monocytes Relative: 9 % (ref 3–12)

## 2010-04-07 LAB — GLUCOSE, CAPILLARY: Glucose-Capillary: 172 mg/dL — ABNORMAL HIGH (ref 70–99)

## 2010-04-07 LAB — URINALYSIS, ROUTINE W REFLEX MICROSCOPIC
Bilirubin Urine: NEGATIVE
Glucose, UA: 100 mg/dL — AB
Hgb urine dipstick: NEGATIVE
Specific Gravity, Urine: 1.026 (ref 1.005–1.030)
Urobilinogen, UA: 0.2 mg/dL (ref 0.0–1.0)
pH: 5.5 (ref 5.0–8.0)

## 2010-04-07 LAB — COMPREHENSIVE METABOLIC PANEL
ALT: 31 U/L (ref 0–53)
AST: 32 U/L (ref 0–37)
Albumin: 3.5 g/dL (ref 3.5–5.2)
Alkaline Phosphatase: 34 U/L — ABNORMAL LOW (ref 39–117)
BUN: 24 mg/dL — ABNORMAL HIGH (ref 6–23)
Chloride: 108 mEq/L (ref 96–112)
Potassium: 4.2 mEq/L (ref 3.5–5.1)
Sodium: 138 mEq/L (ref 135–145)
Total Bilirubin: 0.9 mg/dL (ref 0.3–1.2)
Total Protein: 6.9 g/dL (ref 6.0–8.3)

## 2010-04-07 LAB — CK TOTAL AND CKMB (NOT AT ARMC)
CK, MB: 6.7 ng/mL (ref 0.3–4.0)
CK, MB: 6.9 ng/mL (ref 0.3–4.0)
Relative Index: 2.8 — ABNORMAL HIGH (ref 0.0–2.5)
Relative Index: 3.4 — ABNORMAL HIGH (ref 0.0–2.5)

## 2010-04-07 LAB — URINE MICROSCOPIC-ADD ON

## 2010-04-07 LAB — APTT: aPTT: 25 seconds (ref 24–37)

## 2010-04-07 LAB — TSH: TSH: 1.886 u[IU]/mL (ref 0.350–4.500)

## 2010-04-12 LAB — COMPREHENSIVE METABOLIC PANEL
ALT: 31 U/L (ref 0–53)
BUN: 20 mg/dL (ref 6–23)
Calcium: 8.5 mg/dL (ref 8.4–10.5)
Creatinine, Ser: 0.97 mg/dL (ref 0.4–1.5)
Glucose, Bld: 136 mg/dL — ABNORMAL HIGH (ref 70–99)
Sodium: 134 mEq/L — ABNORMAL LOW (ref 135–145)
Total Protein: 6.4 g/dL (ref 6.0–8.3)

## 2010-04-12 LAB — GLUCOSE, CAPILLARY
Glucose-Capillary: 163 mg/dL — ABNORMAL HIGH (ref 70–99)
Glucose-Capillary: 208 mg/dL — ABNORMAL HIGH (ref 70–99)
Glucose-Capillary: 217 mg/dL — ABNORMAL HIGH (ref 70–99)
Glucose-Capillary: 258 mg/dL — ABNORMAL HIGH (ref 70–99)

## 2010-04-12 LAB — LIPID PANEL
LDL Cholesterol: 37 mg/dL (ref 0–99)
VLDL: 66 mg/dL — ABNORMAL HIGH (ref 0–40)

## 2010-04-12 LAB — CBC
Hemoglobin: 13.6 g/dL (ref 13.0–17.0)
MCHC: 34.8 g/dL (ref 30.0–36.0)
MCV: 91.1 fL (ref 78.0–100.0)
RDW: 15.4 % (ref 11.5–15.5)

## 2010-04-12 LAB — CK TOTAL AND CKMB (NOT AT ARMC)
CK, MB: 5.5 ng/mL — ABNORMAL HIGH (ref 0.3–4.0)
Total CK: 227 U/L (ref 7–232)
Total CK: 249 U/L — ABNORMAL HIGH (ref 7–232)

## 2010-04-22 LAB — GLUCOSE, CAPILLARY
Glucose-Capillary: 126 mg/dL — ABNORMAL HIGH (ref 70–99)
Glucose-Capillary: 135 mg/dL — ABNORMAL HIGH (ref 70–99)
Glucose-Capillary: 145 mg/dL — ABNORMAL HIGH (ref 70–99)
Glucose-Capillary: 149 mg/dL — ABNORMAL HIGH (ref 70–99)
Glucose-Capillary: 53 mg/dL — ABNORMAL LOW (ref 70–99)
Glucose-Capillary: 60 mg/dL — ABNORMAL LOW (ref 70–99)
Glucose-Capillary: 82 mg/dL (ref 70–99)
Glucose-Capillary: 94 mg/dL (ref 70–99)

## 2010-04-22 LAB — CBC
MCHC: 34.5 g/dL (ref 30.0–36.0)
MCV: 96 fL (ref 78.0–100.0)
Platelets: 206 10*3/uL (ref 150–400)

## 2010-04-22 LAB — BASIC METABOLIC PANEL
BUN: 12 mg/dL (ref 6–23)
CO2: 25 mEq/L (ref 19–32)
CO2: 26 mEq/L (ref 19–32)
Chloride: 102 mEq/L (ref 96–112)
Creatinine, Ser: 1.12 mg/dL (ref 0.4–1.5)
GFR calc non Af Amer: >60 mL/min (ref >60)
Glucose, Bld: 131 mg/dL — ABNORMAL HIGH (ref 70–99)
Glucose, Bld: 167 mg/dL — ABNORMAL HIGH (ref 70–99)
Potassium: 3.7 mEq/L (ref 3.5–5.1)
Sodium: 138 mEq/L (ref 135–145)

## 2010-04-23 LAB — POCT I-STAT 3, ART BLOOD GAS (G3+)
Acid-base deficit: 2 mmol/L (ref 0.0–2.0)
Acid-base deficit: 3 mmol/L — ABNORMAL HIGH (ref 0.0–2.0)
Bicarbonate: 24.9 mEq/L — ABNORMAL HIGH (ref 20.0–24.0)
Bicarbonate: 25.2 mEq/L — ABNORMAL HIGH (ref 20.0–24.0)
O2 Saturation: 100 %
O2 Saturation: 91 %
Patient temperature: 37.7
TCO2: 27 mmol/L (ref 0–100)
pCO2 arterial: 47.9 mmHg — ABNORMAL HIGH (ref 35.0–45.0)
pH, Arterial: 7.329 — ABNORMAL LOW (ref 7.350–7.450)
pO2, Arterial: 252 mmHg — ABNORMAL HIGH (ref 80.0–100.0)
pO2, Arterial: 64 mmHg — ABNORMAL LOW (ref 80.0–100.0)

## 2010-04-23 LAB — COMPREHENSIVE METABOLIC PANEL
AST: 19 U/L (ref 0–37)
Albumin: 3.3 g/dL — ABNORMAL LOW (ref 3.5–5.2)
Alkaline Phosphatase: 33 U/L — ABNORMAL LOW (ref 39–117)
BUN: 15 mg/dL (ref 6–23)
BUN: 19 mg/dL (ref 6–23)
Calcium: 9.9 mg/dL (ref 8.4–10.5)
Creatinine, Ser: 1.07 mg/dL (ref 0.4–1.5)
GFR calc Af Amer: >60 mL/min (ref >60)
Glucose, Bld: 188 mg/dL — ABNORMAL HIGH (ref 70–99)
Potassium: 3.5 mEq/L (ref 3.5–5.1)
Total Bilirubin: 0.6 mg/dL (ref 0.3–1.2)
Total Bilirubin: 0.7 mg/dL (ref 0.3–1.2)
Total Protein: 6.8 g/dL (ref 6.0–8.3)
Total Protein: 6.8 g/dL (ref 6.0–8.3)

## 2010-04-23 LAB — DIFFERENTIAL
Basophils Absolute: 0 10*3/uL (ref 0.0–0.1)
Basophils Relative: 1 % (ref 0–1)
Neutro Abs: 4.4 10*3/uL (ref 1.7–7.7)
Neutrophils Relative %: 60 % (ref 43–77)

## 2010-04-23 LAB — CBC
HCT: 26.4 % — ABNORMAL LOW (ref 39.0–52.0)
HCT: 27.3 % — ABNORMAL LOW (ref 39.0–52.0)
HCT: 35.8 % — ABNORMAL LOW (ref 39.0–52.0)
HCT: 36.1 % — ABNORMAL LOW (ref 39.0–52.0)
HCT: 37.5 % — ABNORMAL LOW (ref 39.0–52.0)
HCT: 38.4 % — ABNORMAL LOW (ref 39.0–52.0)
Hemoglobin: 12.5 g/dL — ABNORMAL LOW (ref 13.0–17.0)
Hemoglobin: 12.9 g/dL — ABNORMAL LOW (ref 13.0–17.0)
Hemoglobin: 9.4 g/dL — ABNORMAL LOW (ref 13.0–17.0)
MCHC: 34.4 g/dL (ref 30.0–36.0)
MCHC: 34.4 g/dL (ref 30.0–36.0)
MCHC: 34.6 g/dL (ref 30.0–36.0)
MCV: 94.8 fL (ref 78.0–100.0)
MCV: 95.3 fL (ref 78.0–100.0)
MCV: 95.4 fL (ref 78.0–100.0)
MCV: 95.4 fL (ref 78.0–100.0)
MCV: 95.4 fL (ref 78.0–100.0)
MCV: 95.9 fL (ref 78.0–100.0)
Platelets: 195 10*3/uL (ref 150–400)
Platelets: 198 10*3/uL (ref 150–400)
Platelets: 208 10*3/uL (ref 150–400)
Platelets: 305 10*3/uL (ref 150–400)
Platelets: 308 10*3/uL (ref 150–400)
RBC: 2.78 MIL/uL — ABNORMAL LOW (ref 4.22–5.81)
RBC: 3.75 MIL/uL — ABNORMAL LOW (ref 4.22–5.81)
RBC: 3.8 MIL/uL — ABNORMAL LOW (ref 4.22–5.81)
RDW: 14.1 % (ref 11.5–15.5)
RDW: 14.2 % (ref 11.5–15.5)
RDW: 14.2 % (ref 11.5–15.5)
RDW: 14.2 % (ref 11.5–15.5)
WBC: 8.1 10*3/uL (ref 4.0–10.5)
WBC: 8.2 10*3/uL (ref 4.0–10.5)
WBC: 8.4 10*3/uL (ref 4.0–10.5)
WBC: 8.9 10*3/uL (ref 4.0–10.5)
WBC: 8.9 10*3/uL (ref 4.0–10.5)
WBC: 9.2 10*3/uL (ref 4.0–10.5)

## 2010-04-23 LAB — PROTIME-INR
INR: 0.9 (ref 0.00–1.49)
Prothrombin Time: 12.1 seconds (ref 11.6–15.2)

## 2010-04-23 LAB — GLUCOSE, CAPILLARY
Glucose-Capillary: 109 mg/dL — ABNORMAL HIGH (ref 70–99)
Glucose-Capillary: 114 mg/dL — ABNORMAL HIGH (ref 70–99)
Glucose-Capillary: 122 mg/dL — ABNORMAL HIGH (ref 70–99)
Glucose-Capillary: 123 mg/dL — ABNORMAL HIGH (ref 70–99)
Glucose-Capillary: 130 mg/dL — ABNORMAL HIGH (ref 70–99)
Glucose-Capillary: 137 mg/dL — ABNORMAL HIGH (ref 70–99)
Glucose-Capillary: 151 mg/dL — ABNORMAL HIGH (ref 70–99)
Glucose-Capillary: 154 mg/dL — ABNORMAL HIGH (ref 70–99)
Glucose-Capillary: 155 mg/dL — ABNORMAL HIGH (ref 70–99)
Glucose-Capillary: 166 mg/dL — ABNORMAL HIGH (ref 70–99)
Glucose-Capillary: 167 mg/dL — ABNORMAL HIGH (ref 70–99)

## 2010-04-23 LAB — HEMOGLOBIN A1C
Hgb A1c MFr Bld: 7.1 % — ABNORMAL HIGH (ref 4.6–6.1)
Mean Plasma Glucose: 140 mg/dL
Mean Plasma Glucose: 157 mg/dL

## 2010-04-23 LAB — TYPE AND SCREEN
ABO/RH(D): A POS
Antibody Screen: NEGATIVE

## 2010-04-23 LAB — BLOOD GAS, ARTERIAL
Acid-Base Excess: 0.2 mmol/L (ref 0.0–2.0)
Drawn by: 181601
FIO2: 0.21 %
O2 Saturation: 93.1 %
pCO2 arterial: 42.1 mmHg (ref 35.0–45.0)

## 2010-04-23 LAB — POCT I-STAT 4, (NA,K, GLUC, HGB,HCT)
Glucose, Bld: 153 mg/dL — ABNORMAL HIGH (ref 70–99)
Glucose, Bld: 167 mg/dL — ABNORMAL HIGH (ref 70–99)
HCT: 27 % — ABNORMAL LOW (ref 39.0–52.0)
HCT: 32 % — ABNORMAL LOW (ref 39.0–52.0)
Hemoglobin: 10.9 g/dL — ABNORMAL LOW (ref 13.0–17.0)
Hemoglobin: 12.6 g/dL — ABNORMAL LOW (ref 13.0–17.0)
Hemoglobin: 8.8 g/dL — ABNORMAL LOW (ref 13.0–17.0)
Potassium: 3.5 mEq/L (ref 3.5–5.1)
Potassium: 4.2 mEq/L (ref 3.5–5.1)
Potassium: 5.1 mEq/L (ref 3.5–5.1)
Sodium: 134 mEq/L — ABNORMAL LOW (ref 135–145)
Sodium: 135 mEq/L (ref 135–145)
Sodium: 137 mEq/L (ref 135–145)

## 2010-04-23 LAB — HEPARIN LEVEL (UNFRACTIONATED)
Heparin Unfractionated: 0.37 IU/mL (ref 0.30–0.70)
Heparin Unfractionated: <0.1 IU/mL — ABNORMAL LOW (ref 0.30–0.70)
Heparin Unfractionated: <0.1 IU/mL — ABNORMAL LOW (ref 0.30–0.70)

## 2010-04-23 LAB — CARDIAC PANEL(CRET KIN+CKTOT+MB+TROPI)
CK, MB: 4.6 ng/mL — ABNORMAL HIGH (ref 0.3–4.0)
Relative Index: 3 — ABNORMAL HIGH (ref 0.0–2.5)
Relative Index: 3.3 — ABNORMAL HIGH (ref 0.0–2.5)
Total CK: 150 U/L (ref 7–232)
Total CK: 152 U/L (ref 7–232)
Troponin I: 0.01 ng/mL (ref 0.00–0.06)

## 2010-04-23 LAB — BASIC METABOLIC PANEL
BUN: 12 mg/dL (ref 6–23)
BUN: 12 mg/dL (ref 6–23)
CO2: 25 mEq/L (ref 19–32)
Chloride: 104 mEq/L (ref 96–112)
Chloride: 104 mEq/L (ref 96–112)
Chloride: 105 mEq/L (ref 96–112)
Creatinine, Ser: 0.87 mg/dL (ref 0.4–1.5)
GFR calc Af Amer: >60 mL/min (ref >60)
GFR calc non Af Amer: >60 mL/min (ref >60)
Glucose, Bld: 132 mg/dL — ABNORMAL HIGH (ref 70–99)
Glucose, Bld: 133 mg/dL — ABNORMAL HIGH (ref 70–99)
Potassium: 3.5 mEq/L (ref 3.5–5.1)
Potassium: 3.9 mEq/L (ref 3.5–5.1)
Sodium: 135 mEq/L (ref 135–145)

## 2010-04-23 LAB — POCT I-STAT GLUCOSE: Operator id: 3406

## 2010-04-23 LAB — POCT I-STAT, CHEM 8
Calcium, Ion: 1.13 mmol/L (ref 1.12–1.32)
HCT: 28 % — ABNORMAL LOW (ref 39.0–52.0)
TCO2: 21 mmol/L (ref 0–100)

## 2010-04-23 LAB — LIPID PANEL
Cholesterol: 159 mg/dL (ref 0–200)
Total CHOL/HDL Ratio: 4.2 RATIO

## 2010-04-23 LAB — POCT CARDIAC MARKERS
CKMB, poc: 3.7 ng/mL (ref 1.0–8.0)
Myoglobin, poc: 157 ng/mL (ref 12–200)

## 2010-04-23 LAB — ABO/RH: ABO/RH(D): A POS

## 2010-04-23 LAB — URINALYSIS, ROUTINE W REFLEX MICROSCOPIC
Glucose, UA: NEGATIVE mg/dL
Ketones, ur: NEGATIVE mg/dL
Leukocytes, UA: NEGATIVE
Protein, ur: 100 mg/dL — AB
Urobilinogen, UA: 0.2 mg/dL (ref 0.0–1.0)

## 2010-04-23 LAB — CK TOTAL AND CKMB (NOT AT ARMC)
CK, MB: 4.8 ng/mL — ABNORMAL HIGH (ref 0.3–4.0)
Relative Index: 3.1 — ABNORMAL HIGH (ref 0.0–2.5)

## 2010-04-23 LAB — HEMOGLOBIN AND HEMATOCRIT, BLOOD: HCT: 22.5 % — ABNORMAL LOW (ref 39.0–52.0)

## 2010-04-23 LAB — CREATININE, SERUM
GFR calc Af Amer: >60 mL/min (ref >60)
GFR calc non Af Amer: >60 mL/min (ref >60)

## 2010-04-23 LAB — MAGNESIUM: Magnesium: 2.2 mg/dL (ref 1.5–2.5)

## 2010-04-23 LAB — URINE MICROSCOPIC-ADD ON

## 2010-04-23 LAB — PLATELET COUNT: Platelets: 208 10*3/uL (ref 150–400)

## 2010-04-23 LAB — APTT: aPTT: 28 seconds (ref 24–37)

## 2010-04-25 LAB — GLUCOSE, CAPILLARY: Glucose-Capillary: 168 mg/dL — ABNORMAL HIGH (ref 70–99)

## 2010-04-26 LAB — URINE CULTURE
Colony Count: NO GROWTH
Culture: NO GROWTH

## 2010-04-26 LAB — POCT CARDIAC MARKERS
CKMB, poc: 5.3 ng/mL (ref 1.0–8.0)
CKMB, poc: 6.6 ng/mL (ref 1.0–8.0)
Myoglobin, poc: 244 ng/mL (ref 12–200)
Myoglobin, poc: 281 ng/mL (ref 12–200)
Myoglobin, poc: 449 ng/mL (ref 12–200)
Troponin i, poc: <0.05 ng/mL (ref 0.00–0.09)
Troponin i, poc: <0.05 ng/mL (ref 0.00–0.09)

## 2010-04-26 LAB — URINALYSIS, ROUTINE W REFLEX MICROSCOPIC
Bilirubin Urine: NEGATIVE
Leukocytes, UA: NEGATIVE
Leukocytes, UA: NEGATIVE
Nitrite: NEGATIVE
Nitrite: NEGATIVE
Specific Gravity, Urine: 1.016 (ref 1.005–1.030)
Specific Gravity, Urine: 1.026 (ref 1.005–1.030)
Urobilinogen, UA: 0.2 mg/dL (ref 0.0–1.0)
Urobilinogen, UA: 1 mg/dL (ref 0.0–1.0)
pH: 5.5 (ref 5.0–8.0)
pH: 6 (ref 5.0–8.0)

## 2010-04-26 LAB — CBC
HCT: 47.4 % (ref 39.0–52.0)
Hemoglobin: 16.4 g/dL (ref 13.0–17.0)
MCV: 94.2 fL (ref 78.0–100.0)
Platelets: 235 10*3/uL (ref 150–400)
Platelets: 261 10*3/uL (ref 150–400)
RBC: 5.03 MIL/uL (ref 4.22–5.81)
RDW: 15.1 % (ref 11.5–15.5)
WBC: 8.7 10*3/uL (ref 4.0–10.5)
WBC: 9.5 10*3/uL (ref 4.0–10.5)

## 2010-04-26 LAB — DIFFERENTIAL
Basophils Absolute: 0 10*3/uL (ref 0.0–0.1)
Eosinophils Absolute: 0.3 10*3/uL (ref 0.0–0.7)
Eosinophils Absolute: 0.3 10*3/uL (ref 0.0–0.7)
Eosinophils Relative: 3 % (ref 0–5)
Eosinophils Relative: 3 % (ref 0–5)
Lymphocytes Relative: 22 % (ref 12–46)
Lymphocytes Relative: 23 % (ref 12–46)
Lymphs Abs: 1.9 10*3/uL (ref 0.7–4.0)
Lymphs Abs: 2.1 10*3/uL (ref 0.7–4.0)
Monocytes Relative: 7 % (ref 3–12)
Neutrophils Relative %: 67 % (ref 43–77)

## 2010-04-26 LAB — POCT I-STAT, CHEM 8
Chloride: 104 mEq/L (ref 96–112)
Creatinine, Ser: 1 mg/dL (ref 0.4–1.5)
Glucose, Bld: 237 mg/dL — ABNORMAL HIGH (ref 70–99)
HCT: 49 % (ref 39.0–52.0)
Hemoglobin: 16.7 g/dL (ref 13.0–17.0)
Potassium: 3.7 mEq/L (ref 3.5–5.1)
Sodium: 138 mEq/L (ref 135–145)

## 2010-04-26 LAB — COMPREHENSIVE METABOLIC PANEL
ALT: 40 U/L (ref 0–53)
AST: 39 U/L — ABNORMAL HIGH (ref 0–37)
Alkaline Phosphatase: 38 U/L — ABNORMAL LOW (ref 39–117)
Calcium: 9.4 mg/dL (ref 8.4–10.5)
GFR calc Af Amer: >60 mL/min (ref >60)
Potassium: 3.8 mEq/L (ref 3.5–5.1)
Sodium: 138 mEq/L (ref 135–145)
Total Protein: 7 g/dL (ref 6.0–8.3)

## 2010-04-26 LAB — URINE MICROSCOPIC-ADD ON

## 2010-04-26 LAB — LIPID PANEL
Cholesterol: 167 mg/dL (ref 0–200)
HDL: 32 mg/dL — ABNORMAL LOW (ref >39)
LDL Cholesterol: 83 mg/dL (ref 0–99)
Total CHOL/HDL Ratio: 5.2 RATIO
Triglycerides: 258 mg/dL — ABNORMAL HIGH (ref <150)

## 2010-04-26 LAB — CK TOTAL AND CKMB (NOT AT ARMC)
CK, MB: 9 ng/mL — ABNORMAL HIGH (ref 0.3–4.0)
Total CK: 376 U/L — ABNORMAL HIGH (ref 7–232)

## 2010-04-26 LAB — PROTIME-INR: Prothrombin Time: 12.6 seconds (ref 11.6–15.2)

## 2010-04-26 LAB — BASIC METABOLIC PANEL
BUN: 18 mg/dL (ref 6–23)
CO2: 27 mEq/L (ref 19–32)
Chloride: 100 mEq/L (ref 96–112)
Creatinine, Ser: 0.96 mg/dL (ref 0.4–1.5)
Creatinine, Ser: 0.97 mg/dL (ref 0.4–1.5)
GFR calc Af Amer: >60 mL/min (ref >60)
GFR calc non Af Amer: >60 mL/min (ref >60)
Glucose, Bld: 188 mg/dL — ABNORMAL HIGH (ref 70–99)
Potassium: 3.4 mEq/L — ABNORMAL LOW (ref 3.5–5.1)
Sodium: 135 mEq/L (ref 135–145)

## 2010-04-26 LAB — GLUCOSE, CAPILLARY
Glucose-Capillary: 135 mg/dL — ABNORMAL HIGH (ref 70–99)
Glucose-Capillary: 140 mg/dL — ABNORMAL HIGH (ref 70–99)
Glucose-Capillary: 269 mg/dL — ABNORMAL HIGH (ref 70–99)

## 2010-04-26 LAB — RAPID URINE DRUG SCREEN, HOSP PERFORMED
Amphetamines: NOT DETECTED
Barbiturates: NOT DETECTED
Opiates: NOT DETECTED

## 2010-04-26 LAB — AMMONIA: Ammonia: 27 umol/L (ref 11–35)

## 2010-04-26 LAB — ETHANOL: Alcohol, Ethyl (B): <5 mg/dL (ref 0–10)

## 2010-05-15 ENCOUNTER — Encounter: Payer: Self-pay | Admitting: *Deleted

## 2010-05-15 ENCOUNTER — Encounter: Payer: Self-pay | Admitting: Cardiovascular Disease

## 2010-05-18 ENCOUNTER — Ambulatory Visit (INDEPENDENT_AMBULATORY_CARE_PROVIDER_SITE_OTHER): Payer: PRIVATE HEALTH INSURANCE | Admitting: Cardiovascular Disease

## 2010-05-18 ENCOUNTER — Encounter: Payer: Self-pay | Admitting: Cardiovascular Disease

## 2010-05-18 VITALS — BP 138/95 | HR 74 | Resp 18 | Ht 68.0 in | Wt 225.0 lb

## 2010-05-18 DIAGNOSIS — I10 Essential (primary) hypertension: Secondary | ICD-10-CM

## 2010-05-18 DIAGNOSIS — I2510 Atherosclerotic heart disease of native coronary artery without angina pectoris: Secondary | ICD-10-CM

## 2010-05-18 DIAGNOSIS — E785 Hyperlipidemia, unspecified: Secondary | ICD-10-CM

## 2010-05-18 NOTE — Assessment & Plan Note (Signed)
BP is borderline elevated. He has not been taking his Coreg. He will resume.

## 2010-05-18 NOTE — Patient Instructions (Signed)
Your physician recommends that you schedule a follow-up appointment in: 6 months with Dr. McAlhany.  

## 2010-05-18 NOTE — Progress Notes (Signed)
History of Present Illness:69 yo WM with history of CAD s/p 4V CABG 10/15/08, HTN, DM, BPH with  admission to [facility] 10/08/08 with chest pain.  He underwent 4 V CABG on 10/15/08. He had 1 single episode of chest pain in August 2010 that was mild.  He underwent cardiac catheterization, which showed occlusion of the proximal LAD with faint filling of distal vessel by left-to-left collaterals.  There was about 70% stenosis of the diagonal, 70%-80% left circumflex, and no significant stenosis in the right coronary artery, but it was somewhat ectatic and diffusely diseased.  Ejection fraction was 50% with mild anteroapical  hypokinesis.  He then underwent a cardiac MRI to assess viability of his anterior wall, which showed normal left ventricular size with mildly decreased systolic function and an ejection fraction of 53% with mild anteroseptal hypokinesis. 4V CABG went well.   He is here today for follow up. He tells me that he is feeling well. He has not stopped taking his Plavix, AM dose of Coreg and am dose of Metformin.   Past Medical History  Diagnosis Date  . CAD (coronary artery disease)     s/p 4V CABG 10/15/08  . HTN (hypertension)   . Obesity   . DM (diabetes mellitus)   . Ulcerative colitis   . Sleep apnea   . Syncope   . BPH (benign prostatic hypertrophy)     Past Surgical History  Procedure Date  . Coronary artery bypass graft     Median sternotomy, extracorporeal circulation,  coronary artery bypass graft surgery x4 using a sequential left internal   mammary artery graft to the mid and distal left anterior descending, a   saphenous vein graft to diagonal branch of the left anterior descending,   and a saphenous vein graft to the obtuse marginal branch of left   circumflex coronary artery.    . Cardiac catheterization     Triple-vessel coronary artery disease. Low-normal left ventricular systolic function with mild   anteroapical wall motion abnormality.     Current Outpatient  Prescriptions  Medication Sig Dispense Refill  . allopurinol (ZYLOPRIM) 300 MG tablet Take 300 mg by mouth daily.        [name] Kitchen aspirin 81 MG tablet Take 81 mg by mouth daily.        . carvedilol (COREG) 6.25 MG tablet Take 3.125 mg by mouth 2 (two) times daily with a meal.       . citalopram (CELEXA) 20 MG tablet Take 20 mg by mouth daily.        . clopidogrel (PLAVIX) 75 MG tablet Take 75 mg by mouth daily.        . Cyanocobalamin (VITAMIN B-12 IJ) Inject as directed once a week.        . finasteride (PROSCAR) 5 MG tablet Take 5 mg by mouth daily.        [name] Kitchen glyBURIDE (DIABETA) 5 MG tablet Take 5 mg by mouth daily with breakfast.        . HYDROcodone-acetaminophen (LORTAB) 10-500 MG per tablet Take 1 tablet by mouth every 6 (six) hours as needed.        . indomethacin (INDOCIN) 50 MG capsule Take 50 mg by mouth 2 (two) times daily with a meal.        . metFORMIN (GLUCOPHAGE) 850 MG tablet Take 850 mg by mouth 3 (three) times daily.        . simvastatin (ZOCOR) 40 MG tablet Take 40 mg by mouth  at bedtime.        . Tamsulosin HCl (FLOMAX) 0.4 MG CAPS Take by mouth.        . topiramate (TOPAMAX) 100 MG tablet Take 100 mg by mouth 2 (two) times daily.       [name] Kitchen DISCONTD: olmesartan (BENICAR) 20 MG tablet Take 20 mg by mouth daily.          Allergies not on file  History   Social History  . Marital Status: Married    Spouse Name: N/A    Number of Children: N/A  . Years of Education: N/A   Occupational History  . Not on file.   Social History Main Topics  . Smoking status: Not on file  . Smokeless tobacco: Not on file  . Alcohol Use: Not on file  . Drug Use: Not on file  . Sexually Active: Not on file   Other Topics Concern  . Not on file   Social History Narrative  . No narrative on file    Family History  Problem Relation Age of Onset  . Diabetes Father   . Heart disease Father   . Heart attack Mother   . Aortic aneurysm Mother     Review of Systems:  As stated in the HPI  and otherwise negative.   BP 138/95  Pulse 74  Resp 18  Ht 5\' 8"  (1.727 m)  Wt 225 lb (102.059 kg)  BMI 34.21 kg/m2  Physical Examination: General: Well developed, well nourished, NAD HEENT: OP clear, mucus membranes moist SKIN: warm, dry. No rashes. Neuro: No focal deficits Musculoskeletal: Muscle strength 5/5 all ext Psychiatric: Mood and affect normal Neck: No JVD, no carotid bruits, no thyromegaly, no lymphadenopathy. Lungs:Clear bilaterally, no wheezes, rhonci, crackles Cardiovascular: Regular rate and rhythm. No murmurs, gallops or rubs. Abdomen:Soft. Bowel sounds present. Non-tender.  Extremities: No lower extremity edema. Pulses are 2 + in the bilateral DP/PT.  EKG:NSR, rate 74bpm. LVH. Lateral TWI.

## 2010-05-18 NOTE — Assessment & Plan Note (Signed)
Well controlled.  -Continue statin

## 2010-05-18 NOTE — Assessment & Plan Note (Signed)
Stable. He is asked ot resume his Plavix and Coreg.

## 2010-06-01 NOTE — Discharge Summary (Signed)
NAME:  [name], [name] NO.:  [id number]   MEDICAL RECORD NO.:  [id number]          PATIENT TYPE:  INP   LOCATION:  3714                         FACILITY:  [facility]   PHYSICIAN:  Pearlean Brownie, M.D.DATE OF BIRTH:  Sep 29, 1940   DATE OF ADMISSION:  07/15/2008  DATE OF DISCHARGE:  07/17/2008                               DISCHARGE SUMMARY   PRIMARY CARE PHYSICIAN:  Quentin Mulling, MD, [place].   CONSULTANTS DURING HOSPITALIZATION:  Michael L. Thad Ranger, MD, Neurology.   PROCEDURE:  EEG, which showed mild diffuse slowing suggestive of diffuse  wide spread cerebral dysfunction and MRI, which was negative.   REASON FOR ADMISSION:  Altered mental status.   Admit laboratory values within normal limits.   PRIMARY DISCHARGE DIAGNOSES:  1. Altered mental status.  2. Type 2 diabetes.   SECONDARY DISCHARGE DIAGNOSES:  1. Narcolepsy.  2. Benign prostatic hypertrophy.   NEW MEDICATION ON DISCHARGE:  Depakote 125 mg p.o. b.i.d.   HOME MEDICATIONS CONTINUED ON DISCHARGE:  1. Finasteride 5 mg by mouth daily.  2. Flomax 0.4 mg by mouth daily.  3. Glyburide 5 mg by mouth 3 times daily.  4. Metformin 850 mg by mouth 3 times daily.  5. Phentermine 37.5 mg by mouth daily for narcolepsy.  6. Depakote 125 mg by mouth 2 times daily.  The Depakote is the only      new addition to this home med list.   BRIEF GENERAL HOSPITAL COURSE:  The patient is a 70 year old male with  the history of narcolepsy, has taken phentermine for many years.  He  presents to the emergency department with confusion that is intermittent  off and on during the past year.  Episodes have occurred more frequently  during the past few weeks.  During the episodes, the patient has  confusion, sometimes unsteady gait or leaning backwards.  Usually after  the episode, symptoms stop spontaneously and pt. returns to baseline  (per pt. and wife report.)  On July 03, 2008, he had a fall with an  episode of  confusion and unsteady gait.  There was no loss of  consciousness.  On day of arrival to emergency department, the patient  had had an episode of confusion while in the kitchen with his wife.  She  was concerned and felt like thus should be addressed emergently.  After  being evaluated in the emergency department, the patient was admitted to  the [facility].   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  These episodes have been going on for 1 year or greater.  Neurology was  consulted, they came and performed an EEG.  They also looked at the  findings of the MRI.  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  They recommended, that the patient have  further workup as an outpatient for these episodes.  The patient has an  appointment with Dr. Sandria Manly, neurologist on August 05, 2008.  The patient  was also given Depakote 125  mg by mouth 2 times a day to take at home.  This was given since wife reports the patient has agitation and mood  swings in conjunction with these episodes.   Conditions at discharge stable.  No pending test at this time.  The  patient discharged home with wife.   FOLLOWUP APPOINTMENT:  The patient has appointment with Dr. Quintella Reichert on  Saturday, appointment with Dr. Sandria Manly, neurologist on August 05, 2008.  PCP  to ensure the patient has scheduled an outpatient physical therapy  appointment and followup appointment with PCP or neurologist need to  reassess that Depakote 125 mg by mouth b.i.d. is helping mood swings and  agitation during the episode.  Neurology also suggested that the patient  have C-spine films to look for compression as an outpatient.  May also  want to consider starting Aricept if further workups continue to suggest  dementia.      Ellin Mayhew, MD  Electronically Signed      Pearlean Brownie, M.D.   Electronically Signed    DC/MEDQ  D:  07/17/2008  T:  07/18/2008  Job:  [id number]   cc:   Quentin Mulling, MD  Genene Churn. Love, M.D.

## 2010-06-01 NOTE — Consult Note (Signed)
NAME:  [name], [name]              ACCOUNT NO.:  [id number]   MEDICAL RECORD NO.:  [id number]          PATIENT TYPE:  INP   LOCATION:  3714                         FACILITY:  [facility]   PHYSICIAN:  Michael L. Reynolds, M.D.DATE OF BIRTH:  1940-11-09   DATE OF CONSULTATION:  07/16/2008  DATE OF DISCHARGE:                                 CONSULTATION   CHIEF COMPLAINT:  Decreased memory.   HISTORY OF PRESENT ILLNESS:  This is a pleasant 70 year old Caucasian  male with history of BPH and narcolepsy which has been controlled with  phentermine for multiple years.  According to wife and the patient, the  patient has noticed a declining memory for approximately 1 year.  The  wife first noticed his declining memory as forgetting dates,  appointments, and forgetting to take his medications on time.  This  escalated for few months.  Approximately 6 months ago, wife noted the  patient started to shuffle this feet; he has severe peripheral  neuropathy; difficulty getting up from a seated position without using  his upper extremities.  In addition, she also noticed a resting tremor  in his right hand greater than his left.  This January, the patient  became lost while he was driving home which may look like very nervous  and the patient stopped driving.  Over the last 2 months, the patient  has had two episodes where his wife has had noticed him standing up  subsequently and almost falling backwards.  Over the last week, the  patient has fallen secondary to loss of balance and has stated that he  has had multiple occurrences where his feet feel as if they are glued  to the floor.  Although, the patient has an appointment with Dr. Sandria Manly  in the office on August 05, 2008, wife was very concerned with his  frequent falls and declining memory and asked for a neurological  consult.  On exam, the patient is alert and oriented.  He scored 4/4 on  clock drawing.  He scored 26/30 on the mini-mental status  exam.   PAST MEDICAL HISTORY:  1. Type 2 diabetes.  2. Narcolepsy.  3. BPH.   MEDICATIONS:  1. Finasteride 5 mg daily.  2. Flomax 0.4 mg daily.  3. Glyburide 5 mg b.i.d.  4. Metformin 850 mg b.i.d.  5. Phentermine 31.5 mg daily.   ALLERGIES:  DEXEDRINE which caused the patient to become agitated.   FAMILY HISTORY:  Family history includes father with diabetes, now  deceased.  Mother CAD and abdominal aneurysm, now deceased.  Brother had  rheumatic fever as a child and now suffers from heart disease.  The  patient does not know exact heart disease at this time, however, brother  is still alive.   REVIEW OF SYSTEMS:  Negative with exception of above.   SOCIAL HISTORY:  The patient lives with his wife.  Has a remote history  of THC use.  Smoked 3 packs per day for 30 years, quit 2 months ago.  Denies alcohol use.   PHYSICAL EXAMINATION:  VITAL SIGNS:  Blood pressure is 128/78, pulse  68,  respiratory rate 18, and temperature 98.1.  PULMONARY:  Clear to auscultation bilaterally.  No rhonchi or wheezing.  CARDIOVASCULAR:  S1-S2 is audible.  EKG reads normal sinus rhythm.  No  murmurs were noted.  NECK:  Negative for bruits in subclavian and carotid regions and is  supple.  NEUROLOGIC:  Cranial nerves:  Pupils are equal, round, and react  accommodating to light.  Conjugate gaze.  Extraocular muscles are  intact.  Visual fields are grossly intact.  The patient does not have an  upward or vertical gaze palsy.  Face is symmetrical.  Tongue is midline.  Uvula is midline.  The patient does not have any dysarthria, aphasia, or  slurred speech.  Sensation V1-V3 is bilaterally intact.  Shoulder shrug  and head turn is grossly intact.  Coordination:  Finger-to-nose is  smooth.  Heel-to-shin is limited secondary to the patient's size but is  smooth with no dysmetria.  Fine motor movements are equal bilaterally.   Gait and motor:  The patient has decreased arm swing with short steps   which are shuffled.  Per motor other than his left shoulder which is  weak secondary to pain with possible rotator cuff tear, he has 5/5  strength throughout.  He is able to get up from a seated position  without using his upper extremities.  I did note some positive  cogwheeling in the left wrist.   Deep tendon reflexes are 2+ throughout with downgoing toes.  The patient  has negative drift upper and lower extremities.  Sensation:  The patient  is full to pinprick, light touch, vibration bilaterally in the upper  extremities, however, he has significant peripheral neuropathy  bilaterally in lower extremities from toes to just below the knee cap.  He does have intact proprioception throughout.   LABORATORY DATA:  CK is 376, CK-MB 9.0, and troponin 0.05.  B12 is  normal.  TSH is within normal limits.  RPR is negative.  Sodium 135,  potassium 3.4, chloride 100, CO2 is 27, BUN 16, creatinine 0.96, and  glucose 200.  White blood cell count is 9.5, platelets 261,  hemoglobin/hematocrit 16.7 and 49.0.  Triglycerides high at 258,  cholesterol 157, HDL 32, and LDL is 83.   IMAGING AND TESTS:  MRI shows small vessel changes in the brainstem  along with old left occipital infarct.  MRA shows no significant  occlusion.   ASSESSMENT:  This is a white 70 year old male with subacute gait  disorder along with memory issues, most likely represents a  neurodegenerative disease which can be worked up outpatient.  I will  discuss these findings with Dr. Thad Ranger.  As stated, the patient can  followup with his original appointment with Dr. Sandria Manly on August 05, 2008  and at that time if Dr. Sandria Manly feels medication should be initiated, it  can be done at that time.     ______________________________  Felicie Morn, PA-C      Marolyn Hammock. Thad Ranger, M.D.  Electronically Signed    DS/MEDQ  D:  07/16/2008  T:  07/17/2008  Job:  [id number]   cc:   Casimiro Needle L. Thad Ranger, M.D.

## 2010-06-01 NOTE — H&P (Signed)
NAME:  [name], [name] NO.:  [id number]   MEDICAL RECORD NO.:  [id number]          PATIENT TYPE:  INP   LOCATION:  3714                         FACILITY:  [facility]   PHYSICIAN:  Pearlean Brownie, M.D.DATE OF BIRTH:  Apr 16, 1940   DATE OF ADMISSION:  07/15/2008  DATE OF DISCHARGE:                              HISTORY & PHYSICAL   REASON FOR HOSPITALIZATION:  Altered mental status.   HISTORY OF PRESENT ILLNESS:  The patient is a 70 year old male with a  history of narcolepsy that has been controlled on phentermine for many  years who presents from home at the behest of his wife for progressive  confusion since June 28, 2008.  Prior to this the wife reports that the  patient has been completely at baseline.  The patient reports that his  confusion is intermittent and seems to return to baseline between  episodes.  The patient's wife describes initial episode is occurring  when the patient got out of the car and experienced acute onset of  confusion, slurred speech, and difficulty walking.  The patient's wife  describes the patient is having trouble walking with an unsteady gait  and leaning backwards.  The patient improved after this episode  spontaneously and was at baseline for several days.  On July 03, 2008,  the patient experienced unsteady gait again and had a fall in which he  struck his left shoulder.  The patient was denying any loss of  consciousness or syncope.  The patient was seen in the ED at that time.  His left shoulder films were negative for any fracture and he was sent  home.  Today, the patient's wife notes that he woke up and went to the  kitchen, turned on the stove, opened the microwave as well, poured a  bowl of soup without warming it and sat down to eat it.  The patient  also seemed very confused during this episode.  The patient seems to  minimize most of these symptoms that his wife reports but he does agree  that something is not right.   The patient reports intermittent  confusion over this time, especially since the fall on July 03, 2008.  She does report that he seems to return to baseline in between these  episodes.   ALLERGIES:  No known drug allergies.   MEDICATIONS:  1. Finasteride 5 mg p.o. daily.  2. Flomax 0.4 mg p.o. daily.  3. Glyburide 5 mg which the patient and wife reports that he takes 3      times daily.  4. Metformin 850 mg p.o. t.i.d.  5. Phentermine 37.5 mg daily for narcolepsy.   PAST MEDICAL HISTORY:  1. Narcolepsy.  2. Type 2 diabetes.  3. BPH.  The patient denies any history of UTIs.   SURGERIES:  None.   SOCIAL HISTORY:  The patient lives with his wife Linward Headland, phone number is  [phone].  The patient has a significant smoking history consisting of 3  packs per day for approximately 50 years.  The patient quit  approximately 2 months ago.  The patient reports remote and  occasional  use of marijuana.  He denies alcohol use.  He was a former user but  denies any alcohol dependence or abuse.   FAMILY HISTORY:  Diabetes in the father.   REVIEW OF SYSTEMS:  The patient does report occasional headache.  He  denies any vision changes.  He does report urinary frequency and  hesitancy consistent with his BPH.  The patient denies any chest pain or  shortness of breath.  He denies nausea, vomiting, or diarrhea.  The  patient denies any bright red blood per rectum or melanotic stools.  The  patient does endorse gait unsteadiness as per HPI.  He denies any  dizziness specifically.  Wife reports that the patient has somewhat  increased lability of mood over the last couple of weeks.   PHYSICAL EXAMINATION:  VITAL SIGNS:  Temperature 97.2, blood pressure  154/95, heart rate 100, respiratory rate 22, and O2 saturation 94% on  room air.  Orthostatic vital signs are as follows:  Supine blood  pressure 99/64 and heart rate 63.  Sitting blood pressure 131/94 and  heart rate 111.  Standing blood pressure  113/77 and heart rate 112.  GENERAL:  The patient is obese.  He is alert.  He is cooperative.  HEENT:  Extraocular muscles are intact.  Pupils are equal, round, and  reactive to light.  Oropharynx is pink and moist.  NECK:  There are no carotid bruits.  No JVD is observed.  CARDIOVASCULAR:  Regular rate and rhythm.  No murmurs.  LUNGS:  Work of breathing is unlabored.  Clear to auscultation  bilaterally.  The patient exhibits somewhat decreased breath sounds  throughout.  ABDOMEN:  Positive bowel sounds, obese, soft, nontender, and  nondistended.  EXTREMITIES:  2+ dorsalis pedis and radial pulses.  The patient has 1-2+  bilateral lower extremity pitting edema with chronic changes with stasis  dermatitis.  NEUROLOGIC:  The patient is alert and oriented to year.  It takes him a  while to get this correct.  He is oriented to day of the week but not  the month.  The patient reports the month is July.  Cranial nerves II-  XII are intact.  He has some mild dysmetria with finger-to-nose testing.  Speech is articulate.  There is no facial droop.  Immediate recall is  3/3 and delayed recall is 1/3.  Romberg is negative.  The patient does  have is unsteady when standing up on his feet and does require bracing  on the bed to maintain standing posture.   SIGNIFICANT LABORATORY DATA AND WORKUP:  1. Head CT shows no acute pathology.  There is evidence of a remote      infarct.  On July 03, 2008, he had a head CT which showed extensive      chronic ischemic changes.  2. Chest x-ray shows COPD but no acute process.  3. UDS and alcohol screen are negative.  Ammonia level is normal.  4. CBC shows white blood cell count 9.5, hemoglobin 16.4, platelet      count 261, and 67% neutrophils.  Coag studies were normal.  5. Point-of-care cardiac enzymes x2 were negative except for myoglobin      increased to 289.  6. Urinalysis shows specific gravity 1.026, glucose 500, greater than      300 protein,  otherwise normal.  7. I-STAT chemistries:  Sodium 138, potassium 3.7, chloride 104, CO2      28, BUN 22, creatinine 1.0, and glucose 237.  BNP is 40.   ASSESSMENT AND PLAN:  The patient is a 70 year old male with increasing  confusion.  1. Altered mental status, there is a broadly differential diagnosis      for this issue.  Given the abrupt onset, this is concerning for      vascular dementia, vertebrobasilar insufficiency, recurrent      transient ischemic attack, or seizure disorder.  We will check MRI      to evaluate this further.  We will obtain an EEG as well.  We will      consider neurology consultation possibly tomorrow.  We will check a      fasting lipid panel.  We will check EKG in the morning.  We will      also check an EKG now as well as it has not been done yet in the      emergency department.  The patient has a significant smoking      history and also diabetes.  Also, we will keep postural orthostatic      tachycardia syndrome in the differential diagnosis given the      patient's postural tachycardia on orthostatic vital signs      assessment.  2. History of narcolepsy.  We will continue the patient's phentermine      as this medication can cause withdrawal symptoms.  The patient      seems to be stable on this medication.  3. Diabetes.  We will change glyburide to daily as this is typically a      daily medicine.  We will continue his metformin at t.i.d. dosing      and follow his creatinine and blood sugars.  Sugar was in 200s on i-      STAT chemistry assessment.  4. Benign prostatic hypertrophy.  We will continue finasteride and      Flomax.  5. Fluids, electrolytes, nutrition, and gastrointestinal.      Carbohydrate modified diet and saline lock IV.  6. Prophylaxis.  Heparin t.i.d.  7. Disposition.  Pending MRI, we will consider neurology consultation.      We will check an EEG.  We will also obtain physical therapy and      occupational therapy for  evaluation and treatment of the patient's      gait abnormality and functional limitations.      Myrtie Soman, MD  Electronically Signed      Pearlean Brownie, M.D.  Electronically Signed    TE/MEDQ  D:  07/15/2008  T:  07/16/2008  Job:  [id number]

## 2010-06-01 NOTE — Assessment & Plan Note (Signed)
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  I had him come back a little  early because he was having some drainage from his sternal incision.  He  had small amount of drainage at the lower third of the incision prior to  discharge, but this had essentially stopped.  After discharge, the  patient's wife calls that he had some more drainage that appeared to be  clear fluid.  I started him on Keflex.  He also had some swelling in his  legs and we started him on Lasix 40 per day and potassium 20 per day.  The patient now says that his drainage has completely stopped.  His only  complaint is of some swelling in the right leg and pain around the knee  incision.   PHYSICAL EXAMINATION:  Vital Signs:  Today, his blood pressure is  110/70, pulse 59 and regular, respiratory rate 18 and unlabored.  Oxygen  saturation 92% on room air.  Temperature is 97.1.  Cardiac:  A regular  rate and rhythm with normal heart sounds.  Lung:  Clear.  Chest:  Chest  incision is healing well.  There is no erythema or tenderness.  There is  no drainage.  Extremities:  The right leg saphenous vein harvest  incision at the knee is intact.  There is very mild swelling underneath  this incision.  There is moderate edema in the right lower leg to the  knee level.  He has some chronic venous stasis changes in both lower  legs.  There is no significant edema in the left leg.   IMPRESSION:  The patient is making a fairly good recovery following his  surgery.  The drainage from his chest incision has ceased.  He does have  a small amount of swelling underneath his right leg vein harvest  incision which I think is probably a little bit of serous fluid or  hematoma.  There is no sign of active infection  at this time.  He will  continue his antibiotics which are due to be completed on Thursday of  this week.  His wife will continue to monitor the  incisions and will call me if there is any change.  Otherwise, I will  plan to see him back in about 2 weeks for follow up.   Evelene Croon, M.D.  Electronically Signed   BB/MEDQ  D:  10/28/2008  T:  10/29/2008  Job:  [id number]

## 2010-06-01 NOTE — Procedures (Signed)
EEG NUMBER:  [id number].   CLINICAL HISTORY:  A 70 year old man with memory problems.  EEG is for  evaluation.  The patient is described as awake and alert.  This is a  routine EEG done with photic stimulation, but not hyperventilation.   DESCRIPTION:  The dominant rhythm is tracing, is a moderate amplitude,  alpha rhythm of 8-9 Hz, which predominates posteriorly, appears without  abnormal asymmetry, and attenuates with eye opening and closing.  Low  amplitude fast activity is seen frontally and centrally and appears  without abnormal asymmetry.  No focal slowing is noted and no  epileptiform discharges are seen.  The patient seemed to remain in the  awake state throughout the recording.  Photic stimulation produced  symmetric driving responses best seen in 9 Hz.  Hyperventilation was not  performed.  Single channel devoted EKG revealed irregular rhythm  throughout, suggestive of atrial fibrillation, with ventricular response  rate of approximately 96 beats minute.   CONCLUSION:  Abnormal study due to the presence of mild diffuse slowing  in the background rhythms, findings suggestive of diffuse widespread  cerebral dysfunction and consistent with a drowsy or mildly  encephalopathic or demented state.  No focal slowing is noted and no  epileptiform discharges are seen.  Note is made of atrial fibrillation  in EKG tracing with appropriate ventricular response.      Michael L. Thad Ranger, M.D.  Electronically Signed     [id number]  D:  07/16/2008 19:54:11  T:  07/17/2008 07:29:12  Job #:  [id number]

## 2010-06-01 NOTE — Assessment & Plan Note (Signed)
OFFICE VISIT   [name], [name]  DOB:  [birth year]                                        November 14, 2008  CHART #:  [id number]   The patient returned office today for followup status post coronary  artery bypass graft surgery x4 on October 15, 2008.  He has been doing  well since discharge and walking short distances without chest pain or  shortness of breath.  He saw Dr. Shelle Iron in followup and is being set up  for CPAP for his sleep apnea.  He has continued to abstain from smoking.   PHYSICAL EXAMINATION:  Vital Signs:  Blood pressure 130/74, pulse is 50  and regular.  Respiratory rate is 18, unlabored.  Oxygen saturation on  room air is 95%.  General:  He looks well.  Cardiac:  A regular rate and  rhythm with normal heart sounds.  Lungs:  Clear.  Chest:  The chest  incision is healing well and the sternum is stable.  Extremities:  His  right leg incision is healing well and there is moderate chronic right  lower extremity edema with venous stasis changes present.  There is mild  left lower extremity edema with some venous stasis changes present.   A followup chest x-ray today shows improved aeration bilaterally.  There  is a very small residual left pleural effusion.   MEDICATIONS:  1. Coreg 3.125 mg b.i.d.  2. Aspirin 81 mg daily.  3. Benazepril 5 mg daily.  4. Divalproex 125 mg daily.  5. Finasteride 5 mg daily.  6. Flomax 0.4 mg at bedtime.  7. Metformin 850 mg t.i.d.  8. Glyburide 5 mg t.i.d.  9. Multivitamin daily.  10.Plavix 75 mg daily.  11.Zocor 40 mg at bedtime.  12.Ultram p.r.n. for pain.   IMPRESSION:  Overall, the patient is recovering well following his  surgery.  I encouraged him to continue walking as much as possible,  asked him not to lift anything heavier than 10 pounds for a total of 3  months from date of surgery.  He does not drive due to severe peripheral  neuropathy.  He has followup appointment with Dr. Clifton James on  November 18, 2008.  He can decide whether Mr. Reuel Boom should be placed on chronic  diuretic therapy with some lower extremity edema and chronic venous  stasis changes.  He will return to see me if he has any problems with  his incisions.   Evelene Croon, M.D.  Electronically Signed   BB/MEDQ  D:  11/14/2008  T:  11/15/2008  Job:  [id number]

## 2010-08-04 ENCOUNTER — Other Ambulatory Visit: Payer: Self-pay | Admitting: *Deleted

## 2010-08-04 MED ORDER — CARVEDILOL 6.25 MG PO TABS: 3.1250 mg | ORAL_TABLET | Freq: Two times a day (BID) | ORAL | Status: DC

## 2010-12-31 ENCOUNTER — Telehealth: Payer: Self-pay | Admitting: Cardiovascular Disease

## 2010-12-31 NOTE — Telephone Encounter (Signed)
Spoke with pt's wife. She reports pt was seen today by primary MD--Dr. Quintella Reichert in [place]. Pt was diagnosed with diabetes and started on insulin and told he should follow up with Dr. Clifton James for CHF.  Wife reports pt has had swelling of feet and ankles that has gradually gotten worse over last 2-3 weeks.  No shortness of breath or chest pain.  Appt made for pt to see Norma Fredrickson, NP on January 03, 2011 at 10:30. Wife aware that pt should go to ED to be evaluated if he has shortness of breath or chest pain prior to appt or symptoms worsen.

## 2010-12-31 NOTE — Telephone Encounter (Signed)
New Msg: pt wife calling stating that pt needs to see Dr. Clifton James ASAP. Pt was recently told he has CHF in the right side of his heart. Soonest appt w/ Dr. Clifton James isn't until Jan. 2013.  Please return pt call to discuss further.

## 2011-01-03 ENCOUNTER — Ambulatory Visit (INDEPENDENT_AMBULATORY_CARE_PROVIDER_SITE_OTHER): Payer: PRIVATE HEALTH INSURANCE | Admitting: Nurse Practitioner

## 2011-01-03 ENCOUNTER — Encounter: Payer: Self-pay | Admitting: Nurse Practitioner

## 2011-01-03 VITALS — BP 102/68 | HR 60 | Ht 68.5 in | Wt 243.0 lb

## 2011-01-03 DIAGNOSIS — E119 Type 2 diabetes mellitus without complications: Secondary | ICD-10-CM

## 2011-01-03 DIAGNOSIS — I2510 Atherosclerotic heart disease of native coronary artery without angina pectoris: Secondary | ICD-10-CM

## 2011-01-03 DIAGNOSIS — R609 Edema, unspecified: Secondary | ICD-10-CM

## 2011-01-03 LAB — BASIC METABOLIC PANEL
BUN: 40 mg/dL — ABNORMAL HIGH (ref 6–23)
CO2: 20 mEq/L (ref 19–32)
Calcium: 9.5 mg/dL (ref 8.4–10.5)
Chloride: 106 mEq/L (ref 96–112)
Creatinine, Ser: 1.2 mg/dL (ref 0.4–1.5)
GFR: 65.76 mL/min (ref >60.00)
Glucose, Bld: 237 mg/dL — ABNORMAL HIGH (ref 70–99)
Potassium: 3.9 mEq/L (ref 3.5–5.1)
Sodium: 137 mEq/L (ref 135–145)

## 2011-01-03 MED ORDER — FUROSEMIDE 40 MG PO TABS: 40.0000 mg | ORAL_TABLET | Freq: Every day | ORAL | Status: DC

## 2011-01-03 NOTE — Patient Instructions (Addendum)
You need to back off on the salt use.  We are going to get an ultrasound of your heart.  We are going to add Lasix 40 mg to your medicines. Take each morning.  We are going to check some labs today and again in one week.  I will see you in about 2 1/2 weeks   Call the [facility] office at [phone] if you have any questions, problems or concerns.

## 2011-01-03 NOTE — Assessment & Plan Note (Signed)
We will be checking labs today. We will update his echo. I suspect he has some degree of diastolic dysfunction. I have started him on Lasix 40 mg daily. Recheck BMET in one week. I will see him again in about 2 1/2 weeks. He is strongly encouraged to cut back the salt, but I am not sure he can do this. Patient is agreeable to this plan and will call if any problems develop in the interim.

## 2011-01-03 NOTE — Assessment & Plan Note (Signed)
He has had prior CABG in 2010. Currently with no chest pain.

## 2011-01-03 NOTE — Assessment & Plan Note (Signed)
He reports his sugars are running high. Has been recently started on insulin.

## 2011-01-03 NOTE — Progress Notes (Addendum)
[name] Date of Birth: September 29, 1940 Medical Record #[id number]  History of Present Illness: [name] is seen back today for a work in visit. He is seen for Dr. Clifton James. He has a known history of CAD with prior CABG in 2010, diabetes that is uncontrolled, HTN and obesity.  He is here with his daughter today. He is complaining of a one month history of swelling. Not getting any better. Not really elevating his legs. Sits most of the day. Has excessive salt use. Does have DOE that his daughter notes is getting worse. No chest pain. Blood sugars have been high. He has just started on insulin. No NSAID use recently.   Current Outpatient Prescriptions on File Prior to Visit  Medication Sig Dispense Refill  . allopurinol (ZYLOPRIM) 300 MG tablet Take 300 mg by mouth daily.        [name] Kitchen aspirin 81 MG tablet Take 81 mg by mouth daily.        . benazepril (LOTENSIN) 20 MG tablet Take 20 mg by mouth 2 (two) times daily.        . carvedilol (COREG) 6.25 MG tablet Take 0.5 tablets (3.125 mg total) by mouth 2 (two) times daily with a meal.  60 tablet  6  . citalopram (CELEXA) 20 MG tablet Take 20 mg by mouth daily.        . finasteride (PROSCAR) 5 MG tablet Take 5 mg by mouth daily.        [name] Kitchen glimepiride (AMARYL) 4 MG tablet Take 4 mg by mouth daily before breakfast.        . HYDROcodone-acetaminophen (LORTAB) 10-500 MG per tablet Take 1 tablet by mouth every 6 (six) hours as needed.        . indomethacin (INDOCIN) 50 MG capsule Take 50 mg by mouth as needed.       . insulin glargine (LANTUS) 100 UNIT/ML injection Inject 10 Units into the skin 2 (two) times daily.        . simvastatin (ZOCOR) 40 MG tablet Take 40 mg by mouth at bedtime.        . Tamsulosin HCl (FLOMAX) 0.4 MG CAPS Take by mouth.        . DISCONTD: topiramate (TOPAMAX) 100 MG tablet Take 100 mg by mouth 2 (two) times daily.         No Known Allergies  Past Medical History  Diagnosis Date  . CAD (coronary artery disease)    s/p 4V CABG 10/15/08; EF is 50%  . HTN (hypertension)   . Obesity   . DM (diabetes mellitus)   . Ulcerative colitis   . Sleep apnea   . Syncope   . BPH (benign prostatic hypertrophy)   . Edema     Past Surgical History  Procedure Date  . Coronary artery bypass graft     Median sternotomy, extracorporeal circulation,  coronary artery bypass graft surgery x4 using a sequential left internal   mammary artery graft to the mid and distal left anterior descending, a   saphenous vein graft to diagonal branch of the left anterior descending,   and a saphenous vein graft to the obtuse marginal branch of left   circumflex coronary artery.    . Cardiac catheterization     Triple-vessel coronary artery disease. Low-normal left ventricular systolic function with mild   anteroapical wall motion abnormality.     History  Smoking status  . Former Smoker  . Quit date: 01/18/2008  Smokeless tobacco  .  Not on file    History  Alcohol Use No    Family History  Problem Relation Age of Onset  . Diabetes Father   . Heart disease Father   . Heart attack Mother   . Aortic aneurysm Mother     Review of Systems: The review of systems is positive for edema. No chest pain.  Weight is up since his last visit. All other systems were reviewed and are negative.  Physical Exam: BP 102/68  Pulse 60  Ht 5' 8.5" (1.74 m)  Wt 243 lb (110.224 kg)  BMI 36.41 kg/m2 Patient is an obese white male and in no acute distress. Skin is warm and dry. Color is normal.  HEENT is unremarkable except for very poor dentition. Several broken teeth. Normocephalic/atraumatic. PERRL. Sclera are nonicteric. Neck is supple. No masses. No JVD. Lungs are clear. Cardiac exam shows a regular rate and rhythm. Has an occasional ectopic. Abdomen is obese but soft. Extremities are with 2 to 3+ edema up past his knees. Gait and ROM are intact. No gross neurologic deficits noted.   LABORATORY DATA:   Assessment / Plan:

## 2011-01-05 ENCOUNTER — Ambulatory Visit (HOSPITAL_COMMUNITY): Payer: PRIVATE HEALTH INSURANCE | Attending: Nurse Practitioner | Admitting: Radiology

## 2011-01-05 DIAGNOSIS — I2510 Atherosclerotic heart disease of native coronary artery without angina pectoris: Secondary | ICD-10-CM

## 2011-01-05 DIAGNOSIS — I10 Essential (primary) hypertension: Secondary | ICD-10-CM | POA: Insufficient documentation

## 2011-01-05 DIAGNOSIS — R609 Edema, unspecified: Secondary | ICD-10-CM | POA: Insufficient documentation

## 2011-01-05 DIAGNOSIS — Z951 Presence of aortocoronary bypass graft: Secondary | ICD-10-CM | POA: Insufficient documentation

## 2011-01-05 DIAGNOSIS — E119 Type 2 diabetes mellitus without complications: Secondary | ICD-10-CM | POA: Insufficient documentation

## 2011-01-10 ENCOUNTER — Other Ambulatory Visit (INDEPENDENT_AMBULATORY_CARE_PROVIDER_SITE_OTHER): Payer: PRIVATE HEALTH INSURANCE | Admitting: *Deleted

## 2011-01-10 DIAGNOSIS — R609 Edema, unspecified: Secondary | ICD-10-CM

## 2011-01-10 LAB — BASIC METABOLIC PANEL
BUN: 45 mg/dL — ABNORMAL HIGH (ref 6–23)
CO2: 25 mEq/L (ref 19–32)
Calcium: 9.4 mg/dL (ref 8.4–10.5)
Chloride: 101 mEq/L (ref 96–112)
Creatinine, Ser: 1.3 mg/dL (ref 0.4–1.5)
GFR: 57.15 mL/min — ABNORMAL LOW (ref >60.00)
Glucose, Bld: 405 mg/dL — ABNORMAL HIGH (ref 70–99)
Potassium: 3.7 mEq/L (ref 3.5–5.1)
Sodium: 135 mEq/L (ref 135–145)

## 2011-01-26 ENCOUNTER — Ambulatory Visit (INDEPENDENT_AMBULATORY_CARE_PROVIDER_SITE_OTHER): Payer: PRIVATE HEALTH INSURANCE | Admitting: Nurse Practitioner

## 2011-01-26 ENCOUNTER — Encounter: Payer: Self-pay | Admitting: Nurse Practitioner

## 2011-01-26 VITALS — BP 100/62 | HR 72 | Ht 68.5 in | Wt 238.0 lb

## 2011-01-26 DIAGNOSIS — R609 Edema, unspecified: Secondary | ICD-10-CM

## 2011-01-26 DIAGNOSIS — I2510 Atherosclerotic heart disease of native coronary artery without angina pectoris: Secondary | ICD-10-CM

## 2011-01-26 DIAGNOSIS — E119 Type 2 diabetes mellitus without complications: Secondary | ICD-10-CM

## 2011-01-26 MED ORDER — FUROSEMIDE 40 MG PO TABS: 40.0000 mg | ORAL_TABLET | Freq: Every day | ORAL | Status: DC | PRN

## 2011-01-26 NOTE — Assessment & Plan Note (Signed)
Sugars have been running high at home. The patient's wife has just looked on his insulin prescription. She has only been giving 10 u BID and not 40 u BID by mistake. She will start titrating up based on blood sugars and continue to see his diabetic doctor.

## 2011-01-26 NOTE — Assessment & Plan Note (Signed)
He is doing better. We will use the Lasix only as needed. EF is 40%. He is on ACE and beta blocker. I do not think his blood pressure will support more medicine. He is cutting back on the salt. We will plan on seeing him back in about 3 months. Patient is agreeable to this plan and will call if any problems develop in the interim.

## 2011-01-26 NOTE — Progress Notes (Signed)
[name] Date of Birth: 02/05/1940 Medical Record #[id number]  History of Present Illness: [name] is seen back today for a 3 week check. He is seen for Dr. Clifton James. At his last visit, he was having significant edema. Lasix was started. He has considerable salt use. Echo was updated. EF is still in the 40% range.  He comes in today. He is doing better. Denies chest pain or shortness of breath. Swelling has resolved. Actually has his boots on today. Only took the Lasix for a few days. Now not taking any. He is cutting back on salt use. He is happy with how he feels.   Current Outpatient Prescriptions on File Prior to Visit  Medication Sig Dispense Refill  . allopurinol (ZYLOPRIM) 300 MG tablet Take 300 mg by mouth daily.        [name] Kitchen aspirin 81 MG tablet Take 81 mg by mouth daily.        . benazepril (LOTENSIN) 20 MG tablet Take 20 mg by mouth 2 (two) times daily.        . carvedilol (COREG) 6.25 MG tablet Take 0.5 tablets (3.125 mg total) by mouth 2 (two) times daily with a meal.  60 tablet  6  . citalopram (CELEXA) 20 MG tablet Take 20 mg by mouth daily.        . finasteride (PROSCAR) 5 MG tablet Take 5 mg by mouth daily.        [name] Kitchen glimepiride (AMARYL) 4 MG tablet Take 4 mg by mouth daily before breakfast.        . HYDROcodone-acetaminophen (LORTAB) 10-500 MG per tablet Take 1 tablet by mouth every 6 (six) hours as needed.        . indomethacin (INDOCIN) 50 MG capsule Take 50 mg by mouth as needed.       . simvastatin (ZOCOR) 40 MG tablet Take 40 mg by mouth at bedtime.        . Tamsulosin HCl (FLOMAX) 0.4 MG CAPS Take by mouth.        . topiramate (TOPAMAX) 200 MG tablet Take 200 mg by mouth 2 (two) times daily.        . insulin detemir (LEVEMIR) 100 UNIT/ML injection Inject into the skin 2 (two) times daily.        No Known Allergies  Past Medical History  Diagnosis Date  . CAD (coronary artery disease)     s/p 4V CABG 10/15/08; EF is 50%  . HTN (hypertension)   .  Obesity   . DM (diabetes mellitus)   . Ulcerative colitis   . Sleep apnea   . Syncope   . BPH (benign prostatic hypertrophy)   . Edema   . Left ventricular systolic dysfunction     EF is 40% per echo December 2012    Past Surgical History  Procedure Date  . Coronary artery bypass graft     Median sternotomy, extracorporeal circulation,  coronary artery bypass graft surgery x4 using a sequential left internal   mammary artery graft to the mid and distal left anterior descending, a   saphenous vein graft to diagonal branch of the left anterior descending,   and a saphenous vein graft to the obtuse marginal branch of left   circumflex coronary artery.    . Cardiac catheterization     Triple-vessel coronary artery disease. Low-normal left ventricular systolic function with mild   anteroapical wall motion abnormality.     History  Smoking status  .  Former Smoker  . Quit date: 01/18/2008  Smokeless tobacco  . Not on file    History  Alcohol Use No    Family History  Problem Relation Age of Onset  . Diabetes Father   . Heart disease Father   . Heart attack Mother   . Aortic aneurysm Mother     Review of Systems: The review of systems is per the HPI.  All other systems were reviewed and are negative.  Physical Exam: BP 100/62  Pulse 72  Ht 5' 8.5" (1.74 m)  Wt 238 lb (107.956 kg)  BMI 35.66 kg/m2 Patient is very pleasant and in no acute distress. He is obese. Skin is warm and dry. Color is normal.  HEENT is unremarkable except for very poor dentition. Normocephalic/atraumatic. PERRL. Sclera are nonicteric. Neck is supple. No masses. No JVD. Lungs are clear. Cardiac exam shows a regular rate and rhythm. Abdomen is obese and soft. Extremities are without significant edema. He does have his cowboy boots on. Gait and ROM are intact. No gross neurologic deficits noted.   LABORATORY DATA:   Assessment / Plan:

## 2011-01-26 NOTE — Patient Instructions (Addendum)
I think you are doing better.   Stay on your current medicines.  Stay away from salt.  Use the fluid pill as needed.   Call the [facility] office at [phone] if you have any questions, problems or concerns.   We will see you in 3 months.

## 2011-01-26 NOTE — Assessment & Plan Note (Signed)
Prior CABG in 2010. No symptoms at the present time.

## 2011-05-05 ENCOUNTER — Encounter: Payer: Self-pay | Admitting: Cardiovascular Disease

## 2011-05-05 ENCOUNTER — Ambulatory Visit (INDEPENDENT_AMBULATORY_CARE_PROVIDER_SITE_OTHER): Payer: PRIVATE HEALTH INSURANCE | Admitting: Cardiovascular Disease

## 2011-05-05 VITALS — BP 114/66 | HR 90 | Ht 68.0 in | Wt 236.0 lb

## 2011-05-05 DIAGNOSIS — I5022 Chronic systolic (congestive) heart failure: Secondary | ICD-10-CM | POA: Insufficient documentation

## 2011-05-05 DIAGNOSIS — E785 Hyperlipidemia, unspecified: Secondary | ICD-10-CM

## 2011-05-05 DIAGNOSIS — I2589 Other forms of chronic ischemic heart disease: Secondary | ICD-10-CM

## 2011-05-05 DIAGNOSIS — I2510 Atherosclerotic heart disease of native coronary artery without angina pectoris: Secondary | ICD-10-CM

## 2011-05-05 DIAGNOSIS — I255 Ischemic cardiomyopathy: Secondary | ICD-10-CM

## 2011-05-05 DIAGNOSIS — I10 Essential (primary) hypertension: Secondary | ICD-10-CM

## 2011-05-05 NOTE — Assessment & Plan Note (Signed)
His LV function is mildly reduced. Continue medical therapy.

## 2011-05-05 NOTE — Assessment & Plan Note (Signed)
He is on a statin. Needs lipids updated. Will try to enroll in ACCELERATE trial. Last LDL was 49. HDL 35

## 2011-05-05 NOTE — Progress Notes (Signed)
History of Present Illness: 71 yo WM with history of CAD s/p 4V CABG 10/15/08, HTN, DM, BPH with admission to [facility] 10/08/08 with chest pain. He underwent 4 V CABG on 10/15/08. He had 1 single episode of chest pain in August 2010 that was mild. He underwent cardiac catheterization, which showed occlusion of the proximal LAD with faint filling of distal vessel by left-to-left collaterals. There was about 70% stenosis of the diagonal, 70%-80% left circumflex, and no significant stenosis in the right coronary artery, but it was somewhat ectatic and diffusely diseased. Ejection fraction was 50% with mild anteroapical hypokinesis. He then underwent a cardiac MRI to assess viability of his anterior wall, which showed normal left ventricular size with mildly decreased systolic function and an ejection fraction of 53% with mild anteroseptal hypokinesis. 4V CABG went well. He has done well over the last 2 years. He was seen several times by Norma Fredrickson, NP for lower extremity swelling in January 2013 and was started on Lasix 40 mg po Qdaily. Echo showed LVEF of 40%.   He is here today for follow up. He tells me that he is feeling well. His lower ext swelling has mostly resolved. He has no chest pain or SOB. He does feel fatigued all day long. He has 22 broken teeth at the gumline. He is asking if he can see someone to address this. He has no dental insurance. He also notes issues with his prostate with inability to control his flow of urine. He has been seen by a urologist.   Primary Care Physician: Feliciana Rossetti  Last Lipid Profile: July 2011 Total chol:  125  HDL:  43   LDL:  49  Past Medical History  Diagnosis Date  . CAD (coronary artery disease)     s/p 4V CABG 10/15/08; EF is 50%  . HTN (hypertension)   . Obesity   . DM (diabetes mellitus)   . Ulcerative colitis   . Sleep apnea   . Syncope   . BPH (benign prostatic hypertrophy)   . Edema   . Left ventricular systolic dysfunction     EF is 40% per  echo December 2012    Past Surgical History  Procedure Date  . Coronary artery bypass graft     Median sternotomy, extracorporeal circulation,  coronary artery bypass graft surgery x4 using a sequential left internal   mammary artery graft to the mid and distal left anterior descending, a   saphenous vein graft to diagonal branch of the left anterior descending,   and a saphenous vein graft to the obtuse marginal branch of left   circumflex coronary artery.    . Cardiac catheterization     Triple-vessel coronary artery disease. Low-normal left ventricular systolic function with mild   anteroapical wall motion abnormality.     Current Outpatient Prescriptions  Medication Sig Dispense Refill  . allopurinol (ZYLOPRIM) 300 MG tablet Take 300 mg by mouth as needed.       [name] Kitchen aspirin 81 MG tablet Take 81 mg by mouth daily.        . benazepril (LOTENSIN) 20 MG tablet Take 20 mg by mouth 2 (two) times daily.        . carvedilol (COREG) 6.25 MG tablet Take 0.5 tablets (3.125 mg total) by mouth 2 (two) times daily with a meal.  60 tablet  6  . citalopram (CELEXA) 20 MG tablet Take 20 mg by mouth daily.        [name] Kitchen  finasteride (PROSCAR) 5 MG tablet Take 5 mg by mouth daily.        . furosemide (LASIX) 40 MG tablet Take 1 tablet (40 mg total) by mouth daily as needed.  30 tablet  11  . glimepiride (AMARYL) 4 MG tablet Take 4 mg by mouth daily before breakfast.        . HYDROcodone-acetaminophen (LORTAB) 10-500 MG per tablet Take 1 tablet by mouth every 6 (six) hours as needed.        . indomethacin (INDOCIN) 50 MG capsule Take 50 mg by mouth as needed.       . insulin detemir (LEVEMIR) 100 UNIT/ML injection Inject into the skin 2 (two) times daily.      . simvastatin (ZOCOR) 40 MG tablet Take 40 mg by mouth at bedtime.        . Tamsulosin HCl (FLOMAX) 0.4 MG CAPS Take by mouth.        . topiramate (TOPAMAX) 200 MG tablet Take 200 mg by mouth 2 (two) times daily.          No Known Allergies  History    Social History  . Marital Status: Married    Spouse Name: N/A    Number of Children: N/A  . Years of Education: N/A   Occupational History  . Not on file.   Social History Main Topics  . Smoking status: Former Smoker    Quit date: 01/18/2008  . Smokeless tobacco: Not on file  . Alcohol Use: No  . Drug Use: No  . Sexually Active: No   Other Topics Concern  . Not on file   Social History Narrative  . No narrative on file    Family History  Problem Relation Age of Onset  . Diabetes Father   . Heart disease Father   . Heart attack Mother   . Aortic aneurysm Mother     Review of Systems:  As stated in the HPI and otherwise negative.   BP 114/66  Pulse 90  Ht 5\' 8"  (1.727 m)  Wt 236 lb (107.049 kg)  BMI 35.88 kg/m2  Physical Examination: General: Well developed, well nourished, NAD HEENT: OP clear, mucus membranes moist SKIN: warm, dry. No rashes. Neuro: No focal deficits Musculoskeletal: Muscle strength 5/5 all ext Psychiatric: Mood and affect normal Neck: No JVD, no carotid bruits, no thyromegaly, no lymphadenopathy. Lungs:Clear bilaterally, no wheezes, rhonci, crackles Cardiovascular: Regular rate and rhythm. No murmurs, gallops or rubs. Abdomen:Soft. Bowel sounds present. Non-tender.  Extremities: Chronic venous stasis changes. Trace bilateral lower extremity edema. Pulses are difficult to palpate in the bilateral DP/PT.

## 2011-05-05 NOTE — Assessment & Plan Note (Signed)
BP well controlled.

## 2011-05-05 NOTE — Patient Instructions (Signed)
Your physician wants you to follow-up in: 6 months with Dr. McAlhany.  You will receive a reminder letter in the mail two months in advance. If you don't receive a letter, please call our office to schedule the follow-up appointment.  

## 2011-05-05 NOTE — Assessment & Plan Note (Signed)
Stable. He is on good medical therapy. NO changes today.

## 2011-09-14 ENCOUNTER — Other Ambulatory Visit: Payer: Self-pay | Admitting: Cardiovascular Disease

## 2011-09-15 NOTE — Telephone Encounter (Signed)
Refilled carvedilol.

## 2011-09-23 ENCOUNTER — Encounter: Payer: Self-pay | Admitting: Internal Medicine

## 2011-09-23 DIAGNOSIS — G459 Transient cerebral ischemic attack, unspecified: Secondary | ICD-10-CM | POA: Insufficient documentation

## 2011-09-23 DIAGNOSIS — G8929 Other chronic pain: Secondary | ICD-10-CM | POA: Insufficient documentation

## 2011-09-23 DIAGNOSIS — Z66 Do not resuscitate: Secondary | ICD-10-CM | POA: Insufficient documentation

## 2011-09-23 DIAGNOSIS — M109 Gout, unspecified: Secondary | ICD-10-CM | POA: Insufficient documentation

## 2011-09-23 DIAGNOSIS — Z0000 Encounter for general adult medical examination without abnormal findings: Secondary | ICD-10-CM | POA: Insufficient documentation

## 2011-09-23 DIAGNOSIS — I639 Cerebral infarction, unspecified: Secondary | ICD-10-CM | POA: Insufficient documentation

## 2011-09-23 DIAGNOSIS — N400 Enlarged prostate without lower urinary tract symptoms: Secondary | ICD-10-CM | POA: Insufficient documentation

## 2011-09-23 DIAGNOSIS — G47419 Narcolepsy without cataplexy: Secondary | ICD-10-CM | POA: Insufficient documentation

## 2011-09-23 DIAGNOSIS — I519 Heart disease, unspecified: Secondary | ICD-10-CM | POA: Insufficient documentation

## 2011-09-23 DIAGNOSIS — F319 Bipolar disorder, unspecified: Secondary | ICD-10-CM | POA: Insufficient documentation

## 2011-09-23 DIAGNOSIS — F1011 Alcohol abuse, in remission: Secondary | ICD-10-CM | POA: Insufficient documentation

## 2011-09-28 ENCOUNTER — Encounter: Payer: Self-pay | Admitting: Internal Medicine

## 2011-09-28 ENCOUNTER — Ambulatory Visit (INDEPENDENT_AMBULATORY_CARE_PROVIDER_SITE_OTHER): Payer: PRIVATE HEALTH INSURANCE | Admitting: Internal Medicine

## 2011-09-28 ENCOUNTER — Other Ambulatory Visit (INDEPENDENT_AMBULATORY_CARE_PROVIDER_SITE_OTHER): Payer: PRIVATE HEALTH INSURANCE

## 2011-09-28 VITALS — BP 102/70 | HR 75 | Temp 98.4°F | Ht 68.5 in | Wt 226.5 lb

## 2011-09-28 DIAGNOSIS — Z0000 Encounter for general adult medical examination without abnormal findings: Secondary | ICD-10-CM

## 2011-09-28 DIAGNOSIS — E119 Type 2 diabetes mellitus without complications: Secondary | ICD-10-CM

## 2011-09-28 DIAGNOSIS — N400 Enlarged prostate without lower urinary tract symptoms: Secondary | ICD-10-CM

## 2011-09-28 DIAGNOSIS — Z125 Encounter for screening for malignant neoplasm of prostate: Secondary | ICD-10-CM

## 2011-09-28 DIAGNOSIS — E1142 Type 2 diabetes mellitus with diabetic polyneuropathy: Secondary | ICD-10-CM

## 2011-09-28 DIAGNOSIS — R32 Unspecified urinary incontinence: Secondary | ICD-10-CM | POA: Insufficient documentation

## 2011-09-28 DIAGNOSIS — Z79899 Other long term (current) drug therapy: Secondary | ICD-10-CM

## 2011-09-28 DIAGNOSIS — I10 Essential (primary) hypertension: Secondary | ICD-10-CM

## 2011-09-28 HISTORY — DX: Type 2 diabetes mellitus with diabetic polyneuropathy: E11.42

## 2011-09-28 LAB — URINALYSIS, ROUTINE W REFLEX MICROSCOPIC
Hgb urine dipstick: NEGATIVE
Leukocytes, UA: NEGATIVE
Nitrite: NEGATIVE
Total Protein, Urine: NEGATIVE
pH: 6 (ref 5.0–8.0)

## 2011-09-28 LAB — CBC WITH DIFFERENTIAL/PLATELET
Basophils Absolute: 0.1 10*3/uL (ref 0.0–0.1)
Hemoglobin: 14.4 g/dL (ref 13.0–17.0)
Lymphocytes Relative: 30.9 % (ref 12.0–46.0)
Monocytes Relative: 7.1 % (ref 3.0–12.0)
Neutro Abs: 4.6 10*3/uL (ref 1.4–7.7)
RDW: 13 % (ref 11.5–14.6)
WBC: 8.1 10*3/uL (ref 4.5–10.5)

## 2011-09-28 LAB — TSH: TSH: 1.42 u[IU]/mL (ref 0.35–5.50)

## 2011-09-28 LAB — HEMOGLOBIN A1C: Hgb A1c MFr Bld: 15.5 % — ABNORMAL HIGH (ref 4.6–6.5)

## 2011-09-28 LAB — PSA: PSA: 1 ng/mL (ref 0.10–4.00)

## 2011-09-28 MED ORDER — INSULIN GLARGINE 100 UNIT/ML ~~LOC~~ SOLN: 40.0000 [IU] | Freq: Every day | SUBCUTANEOUS | Status: DC

## 2011-09-28 MED ORDER — FINASTERIDE 5 MG PO TABS: 5.0000 mg | ORAL_TABLET | Freq: Every day | ORAL | Status: DC

## 2011-09-28 MED ORDER — CITALOPRAM HYDROBROMIDE 20 MG PO TABS: 20.0000 mg | ORAL_TABLET | Freq: Every day | ORAL | Status: DC

## 2011-09-28 MED ORDER — GLIMEPIRIDE 4 MG PO TABS: 4.0000 mg | ORAL_TABLET | Freq: Every day | ORAL | Status: DC

## 2011-09-28 NOTE — Patient Instructions (Addendum)
Please return if you change your mind about having the flu shot Continue all other medications as before, including the lantus at 40 units Your medications were refilled today that you requested Please have the pharmacy call with any other refills you may need. Please go to LAB in the Basement for the blood and/or urine tests to be done today You will be contacted by phone if any changes need to be made immediately.  Otherwise, you will receive a letter about your results with an explanation. You will be contacted regarding the referral for: urology Please keep your appointments with your specialists as you have planned - Dr Shelle Iron, Dr Clifton James Please call if you wish to be referred back to Dr Love/neurology as well Please return in 3 mo with Lab testing done 3-5 days before

## 2011-09-28 NOTE — Progress Notes (Signed)
Subjective:    Patient ID: [name], male    DOB: [birth year], 71 y.o.   MRN: [id number]  HPI  Here for wellness and to etablish as new pt with wife present;  Overall doing ok;  Pt denies CP, worsening SOB, DOE, wheezing, orthopnea, PND, worsening LE edema, palpitations, dizziness or syncope.  Pt denies neurological change such as new Headache, facial or extremity weakness.  Pt denies polydipsia, polyuria, or low sugar symptoms. Pt states overall good compliance with treatment and medications, good tolerability, and trying to follow lower cholesterol diet.  Pt denies worsening depressive symptoms, suicidal ideation or panic. No fever, wt loss, night sweats, loss of appetite, or other constitutional symptoms.  Pt states good ability with ADL's, low fall risk, home safety reviewed and adequate, no significant changes in hearing or vision, no significant exercise, walks with cane.  Sees mult MD - Dr Oliver Hum, Dr McAlhany/card, and Dr Michael Boston several yrs ago.  Does have worsening incontinence.  Not taking his lantus but not clear why for several months.  Has long hx of bipolar disease, not on divalproax as in past. Past Medical History  Diagnosis Date  . CAD (coronary artery disease)     s/p 4V CABG 10/15/08; EF is 50%  . Obesity   . BPH (benign prostatic hypertrophy)   . Edema   . Left ventricular systolic dysfunction     EF is 40% per echo December 2012  . AMI, INFERIOR WALL 09/30/2008    Qualifier: Diagnosis of  By: Sherral Hammers, RN, BSN, Melanie    . CAD, ARTERY BYPASS GRAFT 11/18/2008    Qualifier: Diagnosis of  By: Clifton , MD, Cristal Deer    . 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 UNIVERSAL ULCERATIVE COLITIS 07/31/2007    Qualifier:  Diagnosis of  By: Arlyce Dice MD, Barbette Hair   . Ischemic cardiomyopathy 05/05/2011  . DM 09/01/2008    Qualifier: Diagnosis of  By: Kem Parkinson    . OBSTRUCTIVE SLEEP APNEA 09/16/2008    Qualifier: Diagnosis of  By: Shelle Iron MD, Maree Krabbe   . HYPERTENSION 09/01/2008    Qualifier: Diagnosis of  By: Kem Parkinson    . Hyperlipidemia 05/18/2010  . Gout 09/23/2011  . Bipolar affective disorder 09/23/2011  . TIA (transient ischemic attack) 09/23/2011  . Chronic pain 09/23/2011  . DNR (do not resuscitate) 09/23/2011    Per July 2011 [place] admission  . CVA (cerebral infarction) 09/23/2011    Small left occipital  . 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 History of alcohol abuse 09/23/2011  . Blood in stool   . Depression   . Urine incontinence    Past Surgical History  Procedure Date  . Coronary artery bypass graft     Median sternotomy, extracorporeal circulation,  coronary artery bypass graft surgery x4 using a sequential left internal   mammary artery graft to the mid and distal left anterior descending, a   saphenous vein graft to diagonal branch of the left anterior descending,   and a saphenous vein graft to the obtuse marginal branch of left   circumflex coronary artery.    . Cardiac catheterization     Triple-vessel coronary artery disease. Low-normal left ventricular systolic function with mild   anteroapical wall motion abnormality.   [name] Kitchen  Tonsilectomy, adenoidectomy, bilateral myringotomy and tubes 1946    reports that he quit smoking about 3 years ago. He has never used smokeless tobacco. He reports that he does not drink alcohol or use illicit drugs. family history includes Alcohol abuse in his others; Aortic aneurysm in his mother; Arthritis in his others; Diabetes in his father and others; Heart attack in his mother; Heart disease in his father and others; Hyperlipidemia in his other; Hypertension in his other; Mental illness in his others; and Stroke in his other. Allergies  Allergen Reactions  . Dexedrine  (Dextroamphetamine Sulfate Er)     agitation   Current Outpatient Prescriptions on File Prior to Visit  Medication Sig Dispense Refill  . allopurinol (ZYLOPRIM) 300 MG tablet Take 300 mg by mouth as needed.       [name] Kitchen aspirin 81 MG tablet Take 81 mg by mouth daily.        . benazepril (LOTENSIN) 20 MG tablet Take 20 mg by mouth 2 (two) times daily.        . carvedilol (COREG) 6.25 MG tablet TAKE ONE-HALF TABLET BY MOUTH TWICE DAILY WITH A MEAL  60 tablet  11  . citalopram (CELEXA) 20 MG tablet Take 1 tablet (20 mg total) by mouth daily.  90 tablet  3  . furosemide (LASIX) 40 MG tablet Take 1 tablet (40 mg total) by mouth daily as needed.  30 tablet  11  . HYDROcodone-acetaminophen (LORTAB) 10-500 MG per tablet Take 1 tablet by mouth every 6 (six) hours as needed.        . indomethacin (INDOCIN) 50 MG capsule Take 50 mg by mouth as needed.       . simvastatin (ZOCOR) 40 MG tablet Take 40 mg by mouth at bedtime.        . Tamsulosin HCl (FLOMAX) 0.4 MG CAPS Take by mouth.        . topiramate (TOPAMAX) 200 MG tablet Take 200 mg by mouth 2 (two) times daily.        . fenofibrate 160 MG tablet Take 1 tablet (160 mg total) by mouth daily.  90 tablet  3  . finasteride (PROSCAR) 5 MG tablet Take 1 tablet (5 mg total) by mouth daily.  90 tablet  3  . glimepiride (AMARYL) 4 MG tablet Take 1 tablet (4 mg total) by mouth daily before breakfast.  90 tablet  3   Review of Systems Review of Systems  Constitutional: Negative for diaphoresis, activity change, appetite change and unexpected weight change.  HENT: Negative for hearing loss, ear pain, facial swelling, mouth sores and neck stiffness.   Eyes: Negative for pain, redness and visual disturbance.  Respiratory: Negative for shortness of breath and wheezing.   Cardiovascular: Negative for chest pain and palpitations.  Gastrointestinal: Negative for diarrhea, blood in stool, abdominal distention and rectal pain.  Genitourinary: Negative for hematuria,  flank pain and decreased urine volume.  Musculoskeletal: Negative for myalgias and joint swelling.  Skin: Negative for color change and wound.  Neurological: Negative for syncope and numbness.  Hematological: Negative for adenopathy.  Psychiatric/Behavioral: Negative for hallucinations, self-injury, decreased concentration    Objective:   Physical Exam BP 102/70  Pulse 75  Temp 98.4 F (36.9 C) (Oral)  Ht 5' 8.5" (1.74 m)  Wt 226 lb 8 oz (102.74 kg)  BMI 33.94 kg/m2  SpO2 92% Physical Exam  VS noted, not acutely ill appearing Constitutional: Pt is oriented to person, place, and time. Appears well-developed and  well-nourished.  Head: Normocephalic and atraumatic.  Right Ear: External ear normal.  Left Ear: External ear normal.  Nose: Nose normal.  Mouth/Throat: Oropharynx is clear and moist.  Eyes: Conjunctivae and EOM are normal. Pupils are equal, round, and reactive to light.  Neck: Normal range of motion. Neck supple. No JVD present. No tracheal deviation present.  Cardiovascular: Normal rate, regular rhythm, normal heart sounds and intact distal pulses.   Pulmonary/Chest: Effort normal and breath sounds normal.  Abdominal: Soft. Bowel sounds are normal. There is no tenderness.  Musculoskeletal: Normal range of motion. Exhibits no edema.  Lymphadenopathy:  Has no cervical adenopathy.  Neurological: Pt is alert and oriented to person, place, and time. O/w not done in detail.  Skin: Skin is warm and dry. No rash noted.  Psychiatric:  Has  normal mood and affect. Behavior is normal.     Assessment & Plan:

## 2011-09-29 LAB — HEPATIC FUNCTION PANEL
Albumin: 4 g/dL (ref 3.5–5.2)
Alkaline Phosphatase: 51 U/L (ref 39–117)
Total Protein: 7.7 g/dL (ref 6.0–8.3)

## 2011-09-29 LAB — LIPID PANEL
Cholesterol: 260 mg/dL — ABNORMAL HIGH (ref 0–200)
Triglycerides: 927 mg/dL — ABNORMAL HIGH (ref 0.0–149.0)

## 2011-09-29 LAB — BASIC METABOLIC PANEL
BUN: 29 mg/dL — ABNORMAL HIGH (ref 6–23)
CO2: 23 mEq/L (ref 19–32)
Calcium: 10.1 mg/dL (ref 8.4–10.5)
Creatinine, Ser: 1 mg/dL (ref 0.4–1.5)
Glucose, Bld: 471 mg/dL — ABNORMAL HIGH (ref 70–99)
Sodium: 134 mEq/L — ABNORMAL LOW (ref 135–145)

## 2011-09-30 ENCOUNTER — Encounter: Payer: Self-pay | Admitting: Internal Medicine

## 2011-09-30 ENCOUNTER — Other Ambulatory Visit: Payer: Self-pay | Admitting: Internal Medicine

## 2011-09-30 LAB — LDL CHOLESTEROL, DIRECT: Direct LDL: 85.6 mg/dL

## 2011-09-30 MED ORDER — FENOFIBRATE 160 MG PO TABS: 160.0000 mg | ORAL_TABLET | Freq: Every day | ORAL | Status: DC

## 2011-10-01 ENCOUNTER — Encounter: Payer: Self-pay | Admitting: Internal Medicine

## 2011-10-01 NOTE — Assessment & Plan Note (Signed)

## 2011-10-01 NOTE — Assessment & Plan Note (Signed)
Likely poor control, d/w pt - advised needs to re-start lantus as oral med simply not able to control,  to f/u any worsening symptoms or concerns

## 2011-10-01 NOTE — Assessment & Plan Note (Signed)
Unclear etiology, for uA, for urology referral

## 2011-10-03 ENCOUNTER — Encounter: Payer: Self-pay | Admitting: Internal Medicine

## 2011-10-03 ENCOUNTER — Ambulatory Visit (INDEPENDENT_AMBULATORY_CARE_PROVIDER_SITE_OTHER): Payer: PRIVATE HEALTH INSURANCE | Admitting: Internal Medicine

## 2011-10-03 VITALS — BP 108/70 | HR 72 | Temp 98.5°F | Ht 68.5 in | Wt 229.0 lb

## 2011-10-03 DIAGNOSIS — E119 Type 2 diabetes mellitus without complications: Secondary | ICD-10-CM

## 2011-10-03 DIAGNOSIS — E785 Hyperlipidemia, unspecified: Secondary | ICD-10-CM

## 2011-10-03 DIAGNOSIS — L0291 Cutaneous abscess, unspecified: Secondary | ICD-10-CM

## 2011-10-03 MED ORDER — DOXYCYCLINE HYCLATE 100 MG PO TABS: 100.0000 mg | ORAL_TABLET | Freq: Two times a day (BID) | ORAL | Status: DC

## 2011-10-03 MED ORDER — INSULIN GLARGINE 100 UNIT/ML ~~LOC~~ SOLN: 50.0000 [IU] | Freq: Every day | SUBCUTANEOUS | Status: DC

## 2011-10-03 NOTE — Assessment & Plan Note (Signed)
Lab Results  Component Value Date   CHOL 260* 09/28/2011   HDL 43.20 09/28/2011   LDLCALC  Value: 49        Total Cholesterol/HDL:CHD Risk Coronary Heart Disease Risk Table                     Men   Women  1/2 Average Risk   3.4   3.3  Average Risk       5.0   4.4  2 X Average Risk   9.6   7.1  3 X Average Risk  23.4   11.0        Use the calculated Patient Ratio above and the CHD Risk Table to determine the patient's CHD Risk.        ATP III CLASSIFICATION (LDL):  <100     mg/dL   Optimal  409-811  mg/dL   Near or Above                    Optimal  130-159  mg/dL   Borderline  914-782  mg/dL   High  >956     mg/dL   Very High 03/02/863   LDLDIRECT 85.6 09/28/2011   TRIG 927.0* 09/28/2011   CHOLHDL 6 09/28/2011   To cont simvastatin, low chol diet,   to f/u any worsening symptoms or concerns

## 2011-10-03 NOTE — Assessment & Plan Note (Signed)
Left lower abd/pannus near left inguinal area, Mild to mod, for antibx course,  Refused gen surguryt at this time, to f/u any worsening symptoms or concerns or call in 1-2 days if not improving

## 2011-10-03 NOTE — Assessment & Plan Note (Addendum)
Remains severe uncontrolled,  D/w pt and wife regarding lantus titration,  to f/u any worsening symptoms or concerns Lab Results  Component Value Date   HGBA1C 15.5* 09/28/2011

## 2011-10-03 NOTE — Progress Notes (Signed)
Subjective:    Patient ID: [name], male    DOB: 01-17-1941, 71 y.o.   MRN: [id number]  HPI  Here with 4-5 days onset left lower abd/pannus red/tender/swelling area that drained pus 2 days ago with improved but persistent pain/red/swelling, has hx of remote abscess yrs ago but none recent;  Does have recent finding severe DM uncontrolled, re-started lantus but per wife though compliant with med, sugars still in the 400's so far;  Pt denies chest pain, increased sob or doe, wheezing, orthopnea, PND, increased LE swelling, palpitations, dizziness or syncope.   Pt denies polydipsia, polyuria, or low sugar symptoms such as weakness or confusion improved with po intake, but is drinking up to 1 gallon 2% milk per day, which he does not find unusual.Pt denies new neurological symptoms such as new headache, or facial or extremity weakness or numbness  Tolerating the statin as well Past Medical History  Diagnosis Date  . CAD (coronary artery disease)     s/p 4V CABG 10/15/08; EF is 50%  . Obesity   . BPH (benign prostatic hypertrophy)   . Edema   . Left ventricular systolic dysfunction     EF is 40% per echo December 2012  . AMI, INFERIOR WALL 09/30/2008    Qualifier: Diagnosis of  By: Sherral Hammers, RN, BSN, Melanie    . CAD, ARTERY BYPASS GRAFT 11/18/2008    Qualifier: Diagnosis of  By: Clifton , MD, Cristal Deer    . 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 UNIVERSAL ULCERATIVE COLITIS 07/31/2007    Qualifier: Diagnosis of  By: Arlyce Dice MD, Barbette Hair   . Ischemic cardiomyopathy 05/05/2011  . DM 09/01/2008    Qualifier: Diagnosis of  By: Kem Parkinson    . OBSTRUCTIVE SLEEP APNEA 09/16/2008    Qualifier: Diagnosis of  By: Shelle Iron MD, Maree Krabbe   .  HYPERTENSION 09/01/2008    Qualifier: Diagnosis of  By: Kem Parkinson    . Hyperlipidemia 05/18/2010  . Gout 09/23/2011  . Bipolar affective disorder 09/23/2011  . TIA (transient ischemic attack) 09/23/2011  . Chronic pain 09/23/2011  . DNR (do not resuscitate) 09/23/2011    Per July 2011 [place] admission  . CVA (cerebral infarction) 09/23/2011    Small left occipital  . 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 History of alcohol abuse 09/23/2011  . Blood in stool   . Depression   . Urine incontinence    Past Surgical History  Procedure Date  . Coronary artery bypass graft     Median sternotomy, extracorporeal circulation,  coronary artery bypass graft surgery x4 using a sequential left internal   mammary artery graft to the mid and distal left anterior descending, a   saphenous vein graft to diagonal branch of the left anterior descending,   and a saphenous vein graft to the obtuse marginal branch of left   circumflex coronary artery.    . Cardiac catheterization     Triple-vessel coronary artery disease. Low-normal left ventricular systolic function with mild   anteroapical wall motion abnormality.   . Tonsilectomy, adenoidectomy, bilateral myringotomy and tubes 1946    reports that he quit smoking about 3 years ago. He has never used smokeless tobacco. He reports that he does not drink alcohol or use illicit drugs. family  history includes Alcohol abuse in his others; Aortic aneurysm in his mother; Arthritis in his others; Diabetes in his father and others; Heart attack in his mother; Heart disease in his father and others; Hyperlipidemia in his other; Hypertension in his other; Mental illness in his others; and Stroke in his other. Allergies  Allergen Reactions  . Dexedrine (Dextroamphetamine Sulfate Er)     agitation   Current Outpatient Prescriptions on File Prior to Visit  Medication Sig Dispense Refill  . allopurinol (ZYLOPRIM) 300 MG tablet Take 300 mg by mouth as needed.       [name] Kitchen aspirin 81 MG tablet  Take 81 mg by mouth daily.        . benazepril (LOTENSIN) 20 MG tablet Take 20 mg by mouth 2 (two) times daily.        . carvedilol (COREG) 6.25 MG tablet TAKE ONE-HALF TABLET BY MOUTH TWICE DAILY WITH A MEAL  60 tablet  11  . citalopram (CELEXA) 20 MG tablet Take 1 tablet (20 mg total) by mouth daily.  90 tablet  3  . fenofibrate 160 MG tablet Take 1 tablet (160 mg total) by mouth daily.  90 tablet  3  . finasteride (PROSCAR) 5 MG tablet Take 1 tablet (5 mg total) by mouth daily.  90 tablet  3  . furosemide (LASIX) 40 MG tablet Take 1 tablet (40 mg total) by mouth daily as needed.  30 tablet  11  . glimepiride (AMARYL) 4 MG tablet Take 1 tablet (4 mg total) by mouth daily before breakfast.  90 tablet  3  . HYDROcodone-acetaminophen (LORTAB) 10-500 MG per tablet Take 1 tablet by mouth every 6 (six) hours as needed.        . indomethacin (INDOCIN) 50 MG capsule Take 50 mg by mouth as needed.       . simvastatin (ZOCOR) 40 MG tablet Take 40 mg by mouth at bedtime.        . Tamsulosin HCl (FLOMAX) 0.4 MG CAPS Take by mouth.        . topiramate (TOPAMAX) 200 MG tablet Take 200 mg by mouth 2 (two) times daily.         Review of Systems  Constitutional: Negative for diaphoresis and unexpected weight change.  HENT: Negative for tinnitus.   Eyes: Negative for photophobia and visual disturbance.  Respiratory: Negative for choking and stridor.   Gastrointestinal: Negative for vomiting and blood in stool.  Genitourinary: Negative for hematuria and decreased urine volume.  Musculoskeletal: Negative for gait problem.  Skin: Negative for color change and wound. except for the above Neurological: Negative for tremors and numbness.  Psychiatric/Behavioral: Negative for decreased concentration. The patient is not hyperactive.      Objective:   Physical Exam BP 108/70  Pulse 72  Temp 98.5 F (36.9 C) (Oral)  Ht 5' 8.5" (1.74 m)  Wt 229 lb (103.874 kg)  BMI 34.31 kg/m2  SpO2 94% Physical Exam  VS  noted, not ill appearing Constitutional: Pt appears well-developed and well-nourished.  HENT: Head: Normocephalic.  Right Ear: External ear normal.  Left Ear: External ear normal.  Eyes: Conjunctivae and EOM are normal. Pupils are equal, round, and reactive to light.  Neck: Normal range of motion. Neck supple.  Cardiovascular: Normal rate and regular rhythm.   Pulmonary/Chest: Effort normal and breath sounds normal.  Abd:  Soft, NT, non-distended, + BS, but with egg sized subq abscess with tender, nonfluctuant/nondraining at this time Neurological: Pt is alert. Not  confused  Skin: Skin is warm. No erythema.  Psychiatric: Pt behavior is normal. Thought content normal.     Assessment & Plan:

## 2011-10-03 NOTE — Patient Instructions (Addendum)
Take all new medications as prescribed  - the antibiotic Ok to increase the Lantus now to 50 units Then, increase the Lantus by 3 units every 3 days if the sugars are still over 175;  You will want to stop increasing the lantus when the sugar eventually get to about 150 Continue all other medications as before Please call in 2 days if not improved, for general surgury referral

## 2011-10-05 ENCOUNTER — Telehealth: Payer: Self-pay | Admitting: Internal Medicine

## 2011-10-05 DIAGNOSIS — L02211 Cutaneous abscess of abdominal wall: Secondary | ICD-10-CM

## 2011-10-05 NOTE — Telephone Encounter (Signed)
Called the patients wife informed of urgent referral

## 2011-10-05 NOTE — Telephone Encounter (Signed)
Order placed for urgent referral, should be contacted by [facility] soon (today or early tomorrow)

## 2011-10-05 NOTE — Telephone Encounter (Signed)
Per wife pt is not better and was told by Dr Jonny Ruiz  If not better call back and he would refer pt to a surgeon wife ph# [phone]

## 2011-10-05 NOTE — Telephone Encounter (Signed)
Caller: [name]/Patient; Phone: [phone]; Reason for Call: Wife calling to speak with Dr.  Jonny Ruiz regarding patients abscess lower stomach.  States not getting any better and wants to get a referral to see a Careers adviser.  Please call her back.  Thanks

## 2011-10-06 ENCOUNTER — Ambulatory Visit (INDEPENDENT_AMBULATORY_CARE_PROVIDER_SITE_OTHER): Payer: PRIVATE HEALTH INSURANCE | Admitting: General Surgery

## 2011-10-06 ENCOUNTER — Encounter (INDEPENDENT_AMBULATORY_CARE_PROVIDER_SITE_OTHER): Payer: Self-pay | Admitting: General Surgery

## 2011-10-06 VITALS — BP 120/72 | HR 64 | Temp 97.0°F | Resp 16 | Ht 68.5 in | Wt 225.6 lb

## 2011-10-06 DIAGNOSIS — L0291 Cutaneous abscess, unspecified: Secondary | ICD-10-CM

## 2011-10-06 DIAGNOSIS — L0390 Cellulitis, unspecified: Secondary | ICD-10-CM

## 2011-10-06 NOTE — Patient Instructions (Signed)
Use soap and water to clean area daily. Cover with dry gauze and change at least daily. Work on your blood sugar levels. I would recommend calling Dr Jonny Ruiz about your sugars. Call if you get a temperature above 101, area becomes more red. Expect some drainage

## 2011-10-07 NOTE — Progress Notes (Signed)
Patient ID: [name], male   DOB: 05/24/1940, 71 y.o.   MRN: [id number]  Chief Complaint  Patient presents with  . Pre-op Exam    eval abd wall abscess    HPI [name] is a 71 y.o. male.   HPI 71 year old morbidly obese Caucasian male referred by Dr. Jonny Ruiz for evaluation of a left pannus soft tissue infection. The patient states that it's been there almost about a week. It has spontaneously drained some over the past several days. He saw his primary care physician who placed on some antibiotics. He has had a prior infection in that area several years ago. He is a poorly uncontrolled diabetic. His blood sugars are still in the 300s to 400s. Several days ago there were as high as 500. He denies any fevers chills. He states the area is somewhat tender. Past Medical History  Diagnosis Date  . CAD (coronary artery disease)     s/p 4V CABG 10/15/08; EF is 50%  . Obesity   . BPH (benign prostatic hypertrophy)   . Edema   . Left ventricular systolic dysfunction     EF is 40% per echo December 2012  . AMI, INFERIOR WALL 09/30/2008    Qualifier: Diagnosis of  By: Sherral Hammers, RN, BSN, Melanie    . CAD, ARTERY BYPASS GRAFT 11/18/2008    Qualifier: Diagnosis of  By: Clifton James, MD, Cristal Deer    . 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 UNIVERSAL ULCERATIVE COLITIS 07/31/2007    Qualifier: Diagnosis of  By: Arlyce Dice MD, Barbette Hair   . Ischemic cardiomyopathy 05/05/2011  . DM 09/01/2008    Qualifier: Diagnosis of  By: Kem Parkinson    . OBSTRUCTIVE SLEEP APNEA 09/16/2008    Qualifier: Diagnosis of  By: Shelle Iron MD, Maree Krabbe   . HYPERTENSION 09/01/2008    Qualifier: Diagnosis of  By: Kem Parkinson    . Hyperlipidemia 05/18/2010  . Gout 09/23/2011    . Bipolar affective disorder 09/23/2011  . TIA (transient ischemic attack) 09/23/2011  . Chronic pain 09/23/2011  . DNR (do not resuscitate) 09/23/2011    Per July 2011 [place] admission  . CVA (cerebral infarction) 09/23/2011    Small left occipital  . 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 History of alcohol abuse 09/23/2011  . Blood in stool   . Depression   . Urine incontinence     Past Surgical History  Procedure Date  . Coronary artery bypass graft     Median sternotomy, extracorporeal circulation,  coronary artery bypass graft surgery x4 using a sequential left internal   mammary artery graft to the mid and distal left anterior descending, a   saphenous vein graft to diagonal branch of the left anterior descending,   and a saphenous vein graft to the obtuse marginal branch of left   circumflex coronary artery.    . Cardiac catheterization     Triple-vessel coronary artery disease. Low-normal left ventricular systolic function with mild   anteroapical wall motion abnormality.   . Tonsilectomy, adenoidectomy, bilateral myringotomy and tubes 1946    Family History  Problem Relation Age of Onset  . Diabetes Father   . Heart disease Father   . Heart attack Mother   . Aortic aneurysm Mother   .  Alcohol abuse Other   . Arthritis Other   . Hyperlipidemia Other   . Heart disease Other   . Stroke Other   . Hypertension Other   . Diabetes Other   . Mental illness Other   . Alcohol abuse Other   . Arthritis Other   . Heart disease Other   . Mental illness Other   . Diabetes Other     Social History History  Substance Use Topics  . Smoking status: Former Smoker    Quit date: 01/18/2008  . Smokeless tobacco: Never Used  . Alcohol Use: No    Allergies  Allergen Reactions  . Dexedrine (Dextroamphetamine Sulfate Er)     agitation    Current Outpatient Prescriptions  Medication Sig Dispense Refill  . allopurinol (ZYLOPRIM) 300 MG tablet Take 300 mg by mouth as needed.       [name] Kitchen aspirin 81  MG tablet Take 81 mg by mouth daily.        . benazepril (LOTENSIN) 20 MG tablet Take 20 mg by mouth 2 (two) times daily.        . carvedilol (COREG) 6.25 MG tablet TAKE ONE-HALF TABLET BY MOUTH TWICE DAILY WITH A MEAL  60 tablet  11  . citalopram (CELEXA) 20 MG tablet Take 1 tablet (20 mg total) by mouth daily.  90 tablet  3  . doxycycline (VIBRA-TABS) 100 MG tablet Take 1 tablet (100 mg total) by mouth 2 (two) times daily.  20 tablet  0  . fenofibrate 160 MG tablet Take 1 tablet (160 mg total) by mouth daily.  90 tablet  3  . finasteride (PROSCAR) 5 MG tablet Take 1 tablet (5 mg total) by mouth daily.  90 tablet  3  . furosemide (LASIX) 40 MG tablet Take 1 tablet (40 mg total) by mouth daily as needed.  30 tablet  11  . glimepiride (AMARYL) 4 MG tablet Take 1 tablet (4 mg total) by mouth daily before breakfast.  90 tablet  3  . HYDROcodone-acetaminophen (LORTAB) 10-500 MG per tablet Take 1 tablet by mouth every 6 (six) hours as needed.        . indomethacin (INDOCIN) 50 MG capsule Take 50 mg by mouth as needed.       . insulin glargine (LANTUS) 100 UNIT/ML injection Inject 50 Units into the skin daily.  10 mL  11  . simvastatin (ZOCOR) 40 MG tablet Take 40 mg by mouth at bedtime.        . Tamsulosin HCl (FLOMAX) 0.4 MG CAPS Take by mouth.        . topiramate (TOPAMAX) 200 MG tablet Take 200 mg by mouth 2 (two) times daily.          Review of Systems Review of Systems  Constitutional: Negative for fever, chills and unexpected weight change.  Respiratory: Negative for shortness of breath.   Gastrointestinal: Negative for vomiting and abdominal pain.  Genitourinary: Positive for frequency.  Skin: Positive for wound.  Neurological: Negative for seizures.    Blood pressure 120/72, pulse 64, temperature 97 F (36.1 C), temperature source Temporal, resp. rate 16, height 5' 8.5" (1.74 m), weight 225 lb 9.6 oz (102.331 kg).  Physical Exam Physical Exam  Vitals reviewed. Constitutional: He  is oriented to person, place, and time. He appears well-developed. No distress.       Obese WM  HENT:  Head: Normocephalic and atraumatic.  Right Ear: External ear normal.  Left Ear: External ear normal.  Eyes: Conjunctivae normal are normal. No scleral icterus.  Pulmonary/Chest: Effort normal. No stridor. No respiratory distress.  Abdominal: Soft. He exhibits no distension.       Obese with pannus; over left pubic area/pannus - there is 3 cm x 2 cm oblong wound with a scab. No surrounding cellulitis, induration, or fluctuance. Some TTP in area  Musculoskeletal:       Moves slowly around exam room  Neurological: He is alert and oriented to person, place, and time.  Skin: Skin is warm and dry. He is not diaphoretic.  Psychiatric: He has a normal mood and affect. His behavior is normal. Judgment and thought content normal.    Data Reviewed Dr Raphael Gibney note  Assessment    Suprapubic abscess    Plan    Fortunately there is no surrounding cellulitis or signs of extensive deep infection. I disrupted the skin scab with a cotton tip applicator and probed the cavity. It was about 2 cm deep and tracked 1-2 cm in all directions. A gauze was placed over the area. I instructed the patient and his wife on local wound care. I encouraged them to watch the area with soap and water on a daily basis and adequately dry the area. We discussed local wound care. He is instructed to finish his antibiotic course. I explained to them that as long as his blood sugars remained in the high 300s to 400s this would delay wound healing. I encouraged them to follow up with his primary care physician to further address his blood sugar control. Followup 2 weeks for recheck  Mary Sella. Andrey Campanile, MD, FACS General, Bariatric, & Minimally Invasive Surgery [facility], [place]        [facility] M 10/07/2011, 8:02 AM

## 2011-10-10 ENCOUNTER — Telehealth: Payer: Self-pay | Admitting: Internal Medicine

## 2011-10-10 NOTE — Telephone Encounter (Signed)
Caller: [name]/Spouse; Patient Name: [name]; PCP: Oliver Barre (Adults only); Best Callback Phone Number: [phone]. Call regarding a refill for Hydrocodone 10/500mg .  Pt has 2 Tabs left as of 10/10/11.  Per wife wound site is improving, drainage is minimal, and area is tender.   All emergent symptoms ruled out per Postoperative Wound Care with exception to 'All other situations'.  Home care. Pharmacy verified in Epic.  PLEASE FOLLOW UP WITH PT REGARDING SCRIPT.  Thank you!

## 2011-10-11 MED ORDER — HYDROCODONE-ACETAMINOPHEN 10-500 MG PO TABS: 1.0000 | ORAL_TABLET | Freq: Four times a day (QID) | ORAL | Status: DC | PRN

## 2011-10-11 NOTE — Telephone Encounter (Signed)
Faxed hardcopy to pharmacy and informed the wife.

## 2011-10-11 NOTE — Telephone Encounter (Signed)
Done hardcopy to robin  

## 2011-10-25 ENCOUNTER — Encounter (INDEPENDENT_AMBULATORY_CARE_PROVIDER_SITE_OTHER): Payer: Self-pay | Admitting: General Surgery

## 2011-10-25 ENCOUNTER — Ambulatory Visit (INDEPENDENT_AMBULATORY_CARE_PROVIDER_SITE_OTHER): Payer: PRIVATE HEALTH INSURANCE | Admitting: General Surgery

## 2011-10-25 VITALS — BP 136/76 | HR 62 | Temp 98.0°F | Resp 18 | Ht 68.0 in | Wt 227.0 lb

## 2011-10-25 DIAGNOSIS — L0291 Cutaneous abscess, unspecified: Secondary | ICD-10-CM

## 2011-10-25 NOTE — Progress Notes (Signed)
Subjective:     Patient ID: [name], male   DOB: December 25, 1940, 71 y.o.   MRN: [id number]  HPI 71 year old Caucasian male comes in for followup after being evaluated in the urgent office on September 19 for a left pannus/suprapubic soft tissue infection. At that time it was adequately drained. I continued him on his antibiotics. We encouraged him and his wife to get his blood sugars under better control. He comes in for followup. He states the area is completely healed. He denies any fevers or chills. He states that his sugars have improved. They have been running mainly between 200-300.  Review of Systems     Objective:   Physical Exam BP 136/76  Pulse 62  Temp 98 F (36.7 C) (Temporal)  Resp 18  Ht 5\' 8"  (1.727 m)  Wt 227 lb (102.967 kg)  BMI 34.52 kg/m2 Alert, no apparent distress Left suprapubic area demonstrates almost completely healed wound. There is no surrounding cellulitis, induration or fluctuance.    Assessment:     Resolved suprapubic soft tissue infection    Plan:     The area appears to be free of infection. I advised him and his wife try to keep his blood sugars in check. followup prn

## 2011-11-02 ENCOUNTER — Other Ambulatory Visit: Payer: Self-pay

## 2011-11-02 MED ORDER — INSULIN GLARGINE 100 UNIT/ML ~~LOC~~ SOLN: 80.0000 [IU] | Freq: Every day | SUBCUTANEOUS | Status: DC

## 2011-11-03 ENCOUNTER — Encounter: Payer: Self-pay | Admitting: Cardiovascular Disease

## 2011-11-03 ENCOUNTER — Ambulatory Visit (INDEPENDENT_AMBULATORY_CARE_PROVIDER_SITE_OTHER): Payer: PRIVATE HEALTH INSURANCE | Admitting: Cardiovascular Disease

## 2011-11-03 VITALS — BP 134/71 | HR 80 | Ht 68.0 in | Wt 226.0 lb

## 2011-11-03 DIAGNOSIS — E785 Hyperlipidemia, unspecified: Secondary | ICD-10-CM

## 2011-11-03 DIAGNOSIS — I25810 Atherosclerosis of coronary artery bypass graft(s) without angina pectoris: Secondary | ICD-10-CM

## 2011-11-03 DIAGNOSIS — I10 Essential (primary) hypertension: Secondary | ICD-10-CM

## 2011-11-03 NOTE — Progress Notes (Signed)
History of Present Illness: 71 yo WM with history of CAD s/p 4V CABG 10/15/08, HTN, DM, BPH with admission to [facility] 10/08/08 with chest pain. He underwent 4 V CABG on 10/15/08. He had 1 single episode of chest pain in August 2010 that was mild. He underwent cardiac catheterization, which showed occlusion of the proximal LAD with faint filling of distal vessel by left-to-left collaterals. There was about 70% stenosis of the diagonal, 70%-80% left circumflex, and no significant stenosis in the right coronary artery, but it was somewhat ectatic and diffusely diseased. Ejection fraction was 50% with mild anteroapical hypokinesis. He then underwent a cardiac MRI to assess viability of his anterior wall, which showed normal left ventricular size with mildly decreased systolic function and an ejection fraction of 53% with mild anteroseptal hypokinesis. 4V CABG went well. He has done well over the last 3 years. He was seen several times by Norma Fredrickson, NP for lower extremity swelling in January 2013 and was started on Lasix 40 mg po Qdaily. Echo showed LVEF of 40%.   He is here today for follow up. He tells me that he is feeling well. He has no chest pain or SOB.  Primary Care Physician: Oliver Barre  Last Lipid Profile:Lipid Panel     Component Value Date/Time   CHOL 260* 09/28/2011 1036   TRIG 927.0* 09/28/2011 1036   HDL 43.20 09/28/2011 1036   CHOLHDL 6 09/28/2011 1036   VLDL 185.4* 09/28/2011 1036   LDLCALC  Value: 49        Total Cholesterol/HDL:CHD Risk Coronary Heart Disease Risk Table                     Men   Women  1/2 Average Risk   3.4   3.3  Average Risk       5.0   4.4  2 X Average Risk   9.6   7.1  3 X Average Risk  23.4   11.0        Use the calculated Patient Ratio above and the CHD Risk Table to determine the patient's CHD Risk.        ATP III CLASSIFICATION (LDL):  <100     mg/dL   Optimal  161-096  mg/dL   Near or Above                    Optimal  130-159  mg/dL   Borderline  045-409  mg/dL    High  >811     mg/dL   Very High 10/01/7827 5621     Past Medical History  Diagnosis Date  . CAD (coronary artery disease)     s/p 4V CABG 10/15/08; EF is 50%  . Obesity   . BPH (benign prostatic hypertrophy)   . Edema   . Left ventricular systolic dysfunction     EF is 40% per echo December 2012  . AMI, INFERIOR WALL 09/30/2008    Qualifier: Diagnosis of  By: Sherral Hammers, RN, BSN, Melanie    . CAD, ARTERY BYPASS GRAFT 11/18/2008    Qualifier: Diagnosis of  By: Clifton James, MD, Cristal Deer    . 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 UNIVERSAL ULCERATIVE COLITIS 07/31/2007    Qualifier: Diagnosis  of  By: Arlyce Dice MD, Barbette Hair   . Ischemic cardiomyopathy 05/05/2011  . DM 09/01/2008    Qualifier: Diagnosis of  By: Kem Parkinson    . OBSTRUCTIVE SLEEP APNEA 09/16/2008    Qualifier: Diagnosis of  By: Shelle Iron MD, Maree Krabbe   . HYPERTENSION 09/01/2008    Qualifier: Diagnosis of  By: Kem Parkinson    . Hyperlipidemia 05/18/2010  . Gout 09/23/2011  . Bipolar affective disorder 09/23/2011  . TIA (transient ischemic attack) 09/23/2011  . Chronic pain 09/23/2011  . DNR (do not resuscitate) 09/23/2011    Per July 2011 [place] admission  . CVA (cerebral infarction) 09/23/2011    Small left occipital  . 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 History of alcohol abuse 09/23/2011  . Blood in stool   . Depression   . Urine incontinence     Past Surgical History  Procedure Date  . Coronary artery bypass graft     Median sternotomy, extracorporeal circulation,  coronary artery bypass graft surgery x4 using a sequential left internal   mammary artery graft to the mid and distal left anterior descending, a   saphenous vein graft to diagonal branch of the left anterior descending,    and a saphenous vein graft to the obtuse marginal branch of left   circumflex coronary artery.    . Cardiac catheterization     Triple-vessel coronary artery disease. Low-normal left ventricular systolic function with mild   anteroapical wall motion abnormality.   . Tonsilectomy, adenoidectomy, bilateral myringotomy and tubes 1946    Current Outpatient Prescriptions  Medication Sig Dispense Refill  . allopurinol (ZYLOPRIM) 300 MG tablet Take 300 mg by mouth as needed.       [name] Kitchen aspirin 81 MG tablet Take 81 mg by mouth daily.        . benazepril (LOTENSIN) 20 MG tablet Take 20 mg by mouth 2 (two) times daily.        . carvedilol (COREG) 6.25 MG tablet TAKE ONE-HALF TABLET BY MOUTH TWICE DAILY WITH A MEAL  60 tablet  11  . citalopram (CELEXA) 20 MG tablet Take 1 tablet (20 mg total) by mouth daily.  90 tablet  3  . doxycycline (VIBRA-TABS) 100 MG tablet Take 1 tablet (100 mg total) by mouth 2 (two) times daily.  20 tablet  0  . fenofibrate 160 MG tablet Take 1 tablet (160 mg total) by mouth daily.  90 tablet  3  . finasteride (PROSCAR) 5 MG tablet Take 1 tablet (5 mg total) by mouth daily.  90 tablet  3  . furosemide (LASIX) 40 MG tablet Take 1 tablet (40 mg total) by mouth daily as needed.  30 tablet  11  . glimepiride (AMARYL) 4 MG tablet Take 1 tablet (4 mg total) by mouth daily before breakfast.  90 tablet  3  . HYDROcodone-acetaminophen (LORTAB) 10-500 MG per tablet Take 1 tablet by mouth every 6 (six) hours as needed.  60 tablet  1  . indomethacin (INDOCIN) 50 MG capsule Take 50 mg by mouth as needed.       . insulin glargine (LANTUS) 100 UNIT/ML injection Inject 80 Units into the skin daily.  20 mL  10  . simvastatin (ZOCOR) 40 MG tablet Take 40 mg by mouth at bedtime.        . Tamsulosin HCl (FLOMAX) 0.4 MG CAPS Take by mouth.        . topiramate (TOPAMAX) 200 MG tablet Take 200 mg by mouth 2 (  two) times daily.          Allergies  Allergen Reactions  . Dexedrine (Dextroamphetamine  Sulfate Er)     agitation    History   Social History  . Marital Status: Married    Spouse Name: N/A    Number of Children: N/A  . Years of Education: 12   Occupational History  . retired    Social History Main Topics  . Smoking status: Former Smoker    Quit date: 01/18/2008  . Smokeless tobacco: Never Used  . Alcohol Use: No  . Drug Use: No  . Sexually Active: No   Other Topics Concern  . Not on file   Social History Narrative  . No narrative on file    Family History  Problem Relation Age of Onset  . Diabetes Father   . Heart disease Father   . Heart attack Mother   . Aortic aneurysm Mother   . Alcohol abuse Other   . Arthritis Other   . Hyperlipidemia Other   . Heart disease Other   . Stroke Other   . Hypertension Other   . Diabetes Other   . Mental illness Other   . Alcohol abuse Other   . Arthritis Other   . Heart disease Other   . Mental illness Other   . Diabetes Other     Review of Systems:  As stated in the HPI and otherwise negative.   BP 134/71  Pulse 80  Ht 5\' 8"  (1.727 m)  Wt 226 lb (102.513 kg)  BMI 34.36 kg/m2  Physical Examination: General: Well developed, well nourished, NAD HEENT: OP clear, mucus membranes moist SKIN: warm, dry. No rashes. Neuro: No focal deficits Musculoskeletal: Muscle strength 5/5 all ext Psychiatric: Mood and affect normal Neck: No JVD, no carotid bruits, no thyromegaly, no lymphadenopathy. Lungs:Clear bilaterally, no wheezes, rhonci, crackles Cardiovascular: Regular rate and rhythm. No murmurs, gallops or rubs. Abdomen:Soft. Bowel sounds present. Non-tender.  Extremities: No lower extremity edema. Pulses are 2 + in the bilateral DP/PT.  EKG: NSR, rate 82 bpm. LAFB. LVH.   Assessment and Plan:   1. CAD: He is on good medical therapy. No chest pain or SOB. No changes today.   2. HYPERTENSION: BP well controlled.   3. Ischemic cardiomyopathy: His LV function is mildly reduced. Continue medical  therapy.   4. Hyperlipidemia: He is on a statin. LDL 49.

## 2011-11-03 NOTE — Patient Instructions (Addendum)
Your physician wants you to follow-up in:  6 months. You will receive a reminder letter in the mail two months in advance. If you don't receive a letter, please call our office to schedule the follow-up appointment.   

## 2011-11-21 ENCOUNTER — Telehealth: Payer: Self-pay | Admitting: Cardiovascular Disease

## 2011-11-21 NOTE — Telephone Encounter (Signed)
Spoke to pts wife. I gave her the number for Dr. Lanier Ensign office to have evaluation for dental extraction. cdm

## 2011-11-23 ENCOUNTER — Telehealth: Payer: Self-pay | Admitting: Cardiovascular Disease

## 2011-11-23 NOTE — Telephone Encounter (Signed)
New Problem:    Patient's wife called in because Dr. Lanier Ensign office refused to schedule her husband or even speak with her until Dr. Clifton James called to speak with them.  Please call back.

## 2011-11-23 NOTE — Telephone Encounter (Addendum)
**Note De-Identified  Obfuscation** Pt's wife, [name], states that she was advised by Dr. Clifton James to contact Dr. Lanier Ensign office concerning evaluation for dental extraction for [name] (the pt.). When [name] called the office the receptionist told her that a MD has to call to refer pt. Please advise.

## 2011-11-24 NOTE — Telephone Encounter (Signed)
Victor Bible, Can you call and see if we can make referral? I spoke to Dr. Ileene Hutchinson in person and he told me to have them call the office. If I need to speak with someone I can. [name]

## 2011-11-24 NOTE — Telephone Encounter (Signed)
Spoke with Dr.Kalinski's office and referral made.  They will contact pt with appt time.

## 2011-11-25 ENCOUNTER — Encounter (HOSPITAL_COMMUNITY): Payer: Self-pay | Admitting: Dentistry

## 2011-11-25 ENCOUNTER — Encounter (HOSPITAL_COMMUNITY): Payer: Self-pay | Admitting: Respiratory Therapy

## 2011-11-25 ENCOUNTER — Ambulatory Visit (HOSPITAL_COMMUNITY): Payer: Self-pay | Admitting: Dentistry

## 2011-11-25 ENCOUNTER — Other Ambulatory Visit (INDEPENDENT_AMBULATORY_CARE_PROVIDER_SITE_OTHER): Payer: PRIVATE HEALTH INSURANCE

## 2011-11-25 ENCOUNTER — Telehealth: Payer: Self-pay | Admitting: *Deleted

## 2011-11-25 VITALS — BP 104/65 | HR 76 | Temp 98.3°F

## 2011-11-25 DIAGNOSIS — M264 Malocclusion, unspecified: Secondary | ICD-10-CM

## 2011-11-25 DIAGNOSIS — K08109 Complete loss of teeth, unspecified cause, unspecified class: Secondary | ICD-10-CM

## 2011-11-25 DIAGNOSIS — K0530 Chronic periodontitis, unspecified: Secondary | ICD-10-CM

## 2011-11-25 DIAGNOSIS — J392 Other diseases of pharynx: Secondary | ICD-10-CM

## 2011-11-25 DIAGNOSIS — K0889 Other specified disorders of teeth and supporting structures: Secondary | ICD-10-CM

## 2011-11-25 DIAGNOSIS — K036 Deposits [accretions] on teeth: Secondary | ICD-10-CM

## 2011-11-25 DIAGNOSIS — I2510 Atherosclerotic heart disease of native coronary artery without angina pectoris: Secondary | ICD-10-CM

## 2011-11-25 DIAGNOSIS — K029 Dental caries, unspecified: Secondary | ICD-10-CM

## 2011-11-25 DIAGNOSIS — K083 Retained dental root: Secondary | ICD-10-CM

## 2011-11-25 DIAGNOSIS — E119 Type 2 diabetes mellitus without complications: Secondary | ICD-10-CM

## 2011-11-25 DIAGNOSIS — M270 Developmental disorders of jaws: Secondary | ICD-10-CM

## 2011-11-25 DIAGNOSIS — K045 Chronic apical periodontitis: Secondary | ICD-10-CM | POA: Insufficient documentation

## 2011-11-25 LAB — LDL CHOLESTEROL, DIRECT: Direct LDL: 79 mg/dL

## 2011-11-25 LAB — LIPID PANEL
HDL: 39.7 mg/dL (ref >39.00)
Triglycerides: 218 mg/dL — ABNORMAL HIGH (ref 0.0–149.0)
VLDL: 43.6 mg/dL — ABNORMAL HIGH (ref 0.0–40.0)

## 2011-11-25 LAB — BASIC METABOLIC PANEL
Calcium: 9.5 mg/dL (ref 8.4–10.5)
GFR: 43.99 mL/min — ABNORMAL LOW (ref >60.00)
Glucose, Bld: 113 mg/dL — ABNORMAL HIGH (ref 70–99)
Potassium: 4.3 mEq/L (ref 3.5–5.1)
Sodium: 137 mEq/L (ref 135–145)

## 2011-11-25 NOTE — H&P (Signed)
11/25/2011  Patient:            [name] Date of Birth:  Apr 08, 1940 MRN:                [id number]  See Dr. Gibson Ramp note of 11/03/11 below to use as H and P for dental procedures. Dr. Kristin Bruins      History of Present Illness: 71 yo WM with history of CAD s/p 4V CABG 10/15/08, HTN, DM, BPH with admission to [facility] 10/08/08 with chest pain. He underwent 4 V CABG on 10/15/08. He had 1 single episode of chest pain in August 2010 that was mild. He underwent cardiac catheterization, which showed occlusion of the proximal LAD with faint filling of distal vessel by left-to-left collaterals. There was about 70% stenosis of the diagonal, 70%-80% left circumflex, and no significant stenosis in the right coronary artery, but it was somewhat ectatic and diffusely diseased. Ejection fraction was 50% with mild anteroapical hypokinesis. He then underwent a cardiac MRI to assess viability of his anterior wall, which showed normal left ventricular size with mildly decreased systolic function and an ejection fraction of 53% with mild anteroseptal hypokinesis. 4V CABG went well. He has done well over the last 3 years. He was seen several times by Norma Fredrickson, NP for lower extremity swelling in January 2013 and was started on Lasix 40 mg po Qdaily. Echo showed LVEF of 40%.    He is here today for follow up. He tells me that he is feeling well. He has no chest pain or SOB.   Primary Care Physician: Oliver Barre   Last Lipid Profile:Lipid Panel       Component  Value  Date/Time     CHOL  260*  09/28/2011 1036     TRIG  927.0*  09/28/2011 1036     HDL  43.20  09/28/2011 1036     CHOLHDL  6  09/28/2011 1036     VLDL  185.4*  09/28/2011 1036     LDLCALC   Value: 49        Total Cholesterol/HDL:CHD Risk Coronary Heart Disease Risk Table                     Men   Women  1/2 Average Risk   3.4   3.3  Average Risk       5.0   4.4  2 X Average Risk   9.6   7.1  3 X Average Risk  23.4   11.0        Use the calculated  Patient Ratio above and the CHD Risk Table to determine the patient's CHD Risk.        ATP III CLASSIFICATION (LDL):  <100     mg/dL  Optimal  308-657  mg/dL   Near or Above                    Optimal  130-159  mg/dL   Borderline  846-962  mg/dL   High  >952     mg/dL   Very High  8/41/3244 0350           Past Medical History   Diagnosis  Date   .  CAD (coronary artery disease)         s/p 4V CABG 10/15/08; EF is 50%   .  Obesity     .  BPH (benign prostatic hypertrophy)     .  Edema     .  Left ventricular systolic dysfunction         EF is 40% per echo December 2012   .  AMI, INFERIOR WALL  09/30/2008       Qualifier: Diagnosis of  By: Sherral Hammers, RN, BSN, Melanie     .  CAD, ARTERY BYPASS GRAFT  11/18/2008       Qualifier: Diagnosis of  By: Clifton James, MD, Cristal Deer     .  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  UNIVERSAL ULCERATIVE COLITIS  07/31/2007       Qualifier: Diagnosis of  By: Arlyce Dice MD, Barbette Hair    .  Ischemic cardiomyopathy  05/05/2011   .  DM  09/01/2008       Qualifier: Diagnosis of  By: Kem Parkinson     .  OBSTRUCTIVE SLEEP APNEA  09/16/2008       Qualifier: Diagnosis of  By: Shelle Iron MD, Maree Krabbe    .  HYPERTENSION  09/01/2008       Qualifier: Diagnosis of  By: Kem Parkinson     .  Hyperlipidemia  05/18/2010   .  Gout  09/23/2011   .  Bipolar affective disorder  09/23/2011   .  TIA (transient ischemic attack)  09/23/2011   .  Chronic pain  09/23/2011   .  DNR (do not resuscitate)  09/23/2011       Per July 2011 [place] admission   .  CVA (cerebral infarction)  09/23/2011       Small left occipital   .  Narcolepsy  09/23/2011   .  History of alcohol abuse  09/23/2011   .  Blood in stool     .  Depression     .  Urine  incontinence           Past Surgical History   Procedure  Date   .  Coronary artery bypass graft         Median sternotomy, extracorporeal circulation,  coronary artery bypass graft surgery x4 using a sequential left internal   mammary artery graft to the mid and distal left anterior descending, a   saphenous vein graft to diagonal branch of the left anterior descending,   and a saphenous vein graft to the obtuse marginal branch of left   circumflex coronary artery.     .  Cardiac catheterization         Triple-vessel coronary artery disease. Low-normal left ventricular systolic function with mild   anteroapical wall motion abnormality.    .  Tonsilectomy, adenoidectomy, bilateral myringotomy and tubes  1946         Current Outpatient Prescriptions   Medication  Sig  Dispense  Refill   .  allopurinol (ZYLOPRIM) 300 MG tablet  Take 300 mg by mouth as needed.          [name] Kitchen  aspirin 81 MG tablet  Take 81 mg by mouth daily.           .  benazepril (LOTENSIN) 20 MG tablet  Take 20 mg by mouth 2 (two) times daily.           .  carvedilol (COREG) 6.25 MG tablet  TAKE ONE-HALF TABLET BY MOUTH TWICE DAILY WITH  A MEAL   60 tablet   11   .  citalopram (CELEXA) 20 MG tablet  Take 1 tablet (20 mg total) by mouth daily.   90 tablet   3   .  doxycycline (VIBRA-TABS) 100 MG tablet  Take 1 tablet (100 mg total) by mouth 2 (two) times daily.   20 tablet   0   .  fenofibrate 160 MG tablet  Take 1 tablet (160 mg total) by mouth daily.   90 tablet   3   .  finasteride (PROSCAR) 5 MG tablet  Take 1 tablet (5 mg total) by mouth daily.   90 tablet   3   .  furosemide (LASIX) 40 MG tablet  Take 1 tablet (40 mg total) by mouth daily as needed.   30 tablet   11   .  glimepiride (AMARYL) 4 MG tablet  Take 1 tablet (4 mg total) by mouth daily before breakfast.   90 tablet   3   .  HYDROcodone-acetaminophen (LORTAB) 10-500 MG per tablet  Take 1 tablet by mouth every 6 (six) hours as needed.   60 tablet   1   .   indomethacin (INDOCIN) 50 MG capsule  Take 50 mg by mouth as needed.          .  insulin glargine (LANTUS) 100 UNIT/ML injection  Inject 80 Units into the skin daily.   20 mL   10   .  simvastatin (ZOCOR) 40 MG tablet  Take 40 mg by mouth at bedtime.           .  Tamsulosin HCl (FLOMAX) 0.4 MG CAPS  Take by mouth.           .  topiramate (TOPAMAX) 200 MG tablet  Take 200 mg by mouth 2 (two) times daily.                 Allergies   Allergen  Reactions   .  Dexedrine (Dextroamphetamine Sulfate Er)         agitation         History       Social History   .  Marital Status:  Married       Spouse Name:  N/A       Number of Children:  N/A   .  Years of Education:  12       Occupational History   .  retired         Social History Main Topics   .  Smoking status:  Former Smoker       Quit date:  01/18/2008   .  Smokeless tobacco:  Never Used   .  Alcohol Use:  No   .  Drug Use:  No   .  Sexually Active:  No       Other Topics  Concern   .  Not on file       Social History Narrative   .  No narrative on file         Family History   Problem  Relation  Age of Onset   .  Diabetes  Father     .  Heart disease  Father     .  Heart attack  Mother     .  Aortic aneurysm  Mother     .  Alcohol abuse  Other     .  Arthritis  Other     .  Hyperlipidemia  Other     .  Heart disease  Other     .  Stroke  Other     .  Hypertension  Other     .  Diabetes  Other     .  Mental illness  Other     .  Alcohol abuse  Other     .  Arthritis  Other     .  Heart disease  Other     .  Mental illness  Other     .  Diabetes  Other          Review of Systems:  As stated in the HPI and otherwise negative.    BP 134/71  Pulse 80  Ht 5\' 8"  (1.727 m)  Wt 226 lb (102.513 kg)  BMI 34.36 kg/m2   Physical Examination: General: Well developed, well nourished, NAD  HEENT: OP clear, mucus membranes moist  SKIN: warm, dry. No rashes. Neuro: No focal deficits    Musculoskeletal: Muscle strength 5/5 all ext  Psychiatric: Mood and affect normal  Neck: No JVD, no carotid bruits, no thyromegaly, no lymphadenopathy.  Lungs:Clear bilaterally, no wheezes, rhonci, crackles Cardiovascular: Regular rate and rhythm. No murmurs, gallops or rubs. Abdomen:Soft. Bowel sounds present. Non-tender.   Extremities: No lower extremity edema. Pulses are 2 + in the bilateral DP/PT.   EKG: NSR, rate 82 bpm. LAFB. LVH.    Assessment and Plan:    1. CAD: He is on good medical therapy. No chest pain or SOB. No changes today.    2. HYPERTENSION: BP well controlled.    3. Ischemic cardiomyopathy: His LV function is mildly reduced. Continue medical therapy.    4. Hyperlipidemia: He is on a statin. LDL 49.

## 2011-11-25 NOTE — Progress Notes (Signed)
DENTAL CONSULTATION  Date of Consultation:  11/25/2011 Patient Name:   [name] Date of Birth:   08-21-1940 Medical Record Number: [id number]  VITALS: BP 104/65  Pulse 76  Temp 98.3 F (36.8 C) (Oral)   CHIEF COMPLAINT: Patient referred for dental consultation to rule out dental infection that may affect the patient's systemic health.  HPI: [name] is a 71 -year-old male followed by Dr. Earney Hamburg.  Patient with history of coronary disease and is status post 4 vessel coronary artery bypass graft procedure.  Dental consultation requested to rule out dental infection that may affect the patient's systemic health.  Patient with a history of intermittent toothache symptoms. Patient describes the pain as being sharp and intermittent in nature. Patient does have a history of spontaneous pain. Patient indicates that multiple teeth have hurt over the past 6-12 months. Patient was last seen by dentist approximately 7 years ago to have a tooth pulled. Patient denies any complications from that dental extraction. Patient has no partial dentures. Patient is interested in having all remaining teeth extracted at this time    PMH: Past Medical History  Diagnosis Date  . CAD (coronary artery disease)     s/p 4V CABG 10/15/08; EF is 50%  . Obesity   . BPH (benign prostatic hypertrophy)   . Edema   . Left ventricular systolic dysfunction     EF is 40% per echo December 2012  . AMI, INFERIOR WALL 09/30/2008    Qualifier: Diagnosis of  By: Sherral Hammers, RN, BSN, Melanie    . CAD, ARTERY BYPASS GRAFT 11/18/2008    Qualifier: Diagnosis of  By: Clifton James, MD, Cristal Deer    . 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 UNIVERSAL ULCERATIVE COLITIS  07/31/2007    Qualifier: Diagnosis of  By: Arlyce Dice MD, Barbette Hair   . Ischemic cardiomyopathy 05/05/2011  . DM 09/01/2008    Qualifier: Diagnosis of  By: Kem Parkinson    . OBSTRUCTIVE SLEEP APNEA 09/16/2008    Qualifier: Diagnosis of  By: Shelle Iron MD, Maree Krabbe   . HYPERTENSION 09/01/2008    Qualifier: Diagnosis of  By: Kem Parkinson    . Hyperlipidemia 05/18/2010  . Gout 09/23/2011  . Bipolar affective disorder 09/23/2011  . TIA (transient ischemic attack) 09/23/2011  . Chronic pain 09/23/2011  . DNR (do not resuscitate) 09/23/2011    Per July 2011 [place] admission  . CVA (cerebral infarction) 09/23/2011    Small left occipital  . 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 History of alcohol abuse 09/23/2011  . Blood in stool   . Depression   . Urine incontinence     PSH: Past Surgical History  Procedure Date  . Coronary artery bypass graft     Median sternotomy, extracorporeal circulation,  coronary artery bypass graft surgery x4 using a sequential left internal   mammary artery graft to the mid and distal left anterior descending, a   saphenous vein graft to diagonal branch of the left anterior descending,   and a saphenous vein graft to the obtuse marginal branch of left   circumflex coronary artery.    . Cardiac catheterization     Triple-vessel coronary artery disease. Low-normal left ventricular systolic function with mild   anteroapical wall motion abnormality.   [name] Kitchen  Tonsilectomy, adenoidectomy, bilateral myringotomy and tubes 1946    ALLERGIES: Allergies  Allergen Reactions  . Dexedrine (Dextroamphetamine Sulfate Er)     agitation    MEDICATIONS: Current Outpatient Prescriptions  Medication Sig Dispense Refill  . allopurinol (ZYLOPRIM) 300 MG tablet Take 300 mg by mouth as needed.       [name] Kitchen aspirin 81 MG tablet Take 81 mg by mouth daily.        . benazepril (LOTENSIN) 20 MG tablet Take 20 mg by mouth 2 (two) times daily.        . carvedilol (COREG) 6.25 MG tablet TAKE ONE-HALF TABLET BY MOUTH TWICE  DAILY WITH A MEAL  60 tablet  11  . citalopram (CELEXA) 20 MG tablet Take 1 tablet (20 mg total) by mouth daily.  90 tablet  3  . doxycycline (VIBRA-TABS) 100 MG tablet Take 1 tablet (100 mg total) by mouth 2 (two) times daily.  20 tablet  0  . fenofibrate 160 MG tablet Take 1 tablet (160 mg total) by mouth daily.  90 tablet  3  . finasteride (PROSCAR) 5 MG tablet Take 1 tablet (5 mg total) by mouth daily.  90 tablet  3  . furosemide (LASIX) 40 MG tablet Take 1 tablet (40 mg total) by mouth daily as needed.  30 tablet  11  . glimepiride (AMARYL) 4 MG tablet Take 1 tablet (4 mg total) by mouth daily before breakfast.  90 tablet  3  . HYDROcodone-acetaminophen (LORTAB) 10-500 MG per tablet Take 1 tablet by mouth every 6 (six) hours as needed.  60 tablet  1  . indomethacin (INDOCIN) 50 MG capsule Take 50 mg by mouth as needed.       . insulin glargine (LANTUS) 100 UNIT/ML injection Inject 80 Units into the skin daily.  20 mL  10  . simvastatin (ZOCOR) 40 MG tablet Take 40 mg by mouth at bedtime.        . Tamsulosin HCl (FLOMAX) 0.4 MG CAPS Take by mouth.        . topiramate (TOPAMAX) 200 MG tablet Take 200 mg by mouth 2 (two) times daily.          LABS: Lab Results  Component Value Date   WBC 8.1 09/28/2011   HGB 14.4 09/28/2011   HCT 42.7 09/28/2011   MCV 94.8 09/28/2011   PLT 208.0 09/28/2011      Component Value Date/Time   NA 134* 09/28/2011 1036   K 4.6 09/28/2011 1036   CL 101 09/28/2011 1036   CO2 23 09/28/2011 1036   GLUCOSE 471* 09/28/2011 1036   BUN 29* 09/28/2011 1036   CREATININE 1.0 09/28/2011 1036   CALCIUM 10.1 09/28/2011 1036   GFRNONAA 68.69 11/25/2009 0811   GFRAA  Value: >60        The eGFR has been calculated using the MDRD equation. This calculation has not been validated in all clinical situations. eGFR's persistently <60 mL/min signify possible Chronic Kidney Disease. 08/04/2009 0350   Lab Results  Component Value Date   INR 0.99 08/02/2009   INR 0.93 03/16/2009   INR 1.2  10/15/2008   No results found for this basename: PTT    SOCIAL HISTORY: History   Social History  . Marital Status: Married    Spouse Name: N/A    Number of Children: N/A  . Years of Education: 12   Occupational History  . retired    Social History Main Topics  . Smoking status: Former  Smoker    Quit date: 01/18/2008  . Smokeless tobacco: Never Used  . Alcohol Use: No  . Drug Use: No  . Sexually Active: No   Other Topics Concern  . Not on file   Social History Narrative   Patient has been married for 51 years.Patient presents with his wife today. Patient has 2 grown children and lives in [place] and [place] areas respectively.Patient is a nonsmoker, nondrinker at this time.    FAMILY HISTORY: Family History  Problem Relation Age of Onset  . Diabetes Father   . Heart disease Father   . Heart attack Mother   . Aortic aneurysm Mother   . Alcohol abuse Other   . Arthritis Other   . Hyperlipidemia Other   . Heart disease Other   . Stroke Other   . Hypertension Other   . Diabetes Other   . Mental illness Other   . Alcohol abuse Other   . Arthritis Other   . Heart disease Other   . Mental illness Other   . Diabetes Other      REVIEW OF SYSTEMS: Reviewed with patient and included in dental chart .  DENTAL HISTORY: CHIEF COMPLAINT: Patient referred for dental consultation to rule out dental infection that may affect the patient's systemic health.  HPI: [name] is a 63 -year-old male followed by Dr. Earney Hamburg.  Patient with history of coronary disease and is status post 4 vessel coronary artery bypass graft procedure.  Dental consultation requested to rule out dental infection that may affect the patient's systemic health.  Patient with a history of intermittent toothache symptoms. Patient describes the pain as being sharp and intermittent in nature. Patient does have a history of spontaneous pain. Patient indicates that multiple teeth have hurt  over the past 6-12 months. Patient was last seen by dentist approximately 7 years ago to have a tooth pulled. Patient denies any complications from that dental extraction. Patient has no partial dentures. Patient is interested in having all remaining teeth extracted at this time    DENTAL EXAMINATION:  GENERAL:Patient is a well-developed, well-nourished male in no acute distress. HEAD AND NECK:There is no palpable submandibular lymphadenopathy. The patient denies acute TMJ symptoms.  INTRAORAL EXAM:The patient has normal saliva. I do not see any intraoral abscesses. The patient has a small palatal torus. Patient has bilateral mandibular lingual tori. The patient has a fissured tongue. Patient also has what appears to be geographic tongue and is consistent with moving around by patient report. DENTITION:Patient is missing tooth numbers 1, 2, 18, 19.  There are multiple retained root segments as per dental charting form. PERIODONTAL:Patient with chronic periodontitis with plaque and calculus accumulations, gingival recession, and tooth mobility. DENTAL CARIES/SUBOPTIMAL RESTORATIONS:There multiple dental caries noted to be affecting all remaining teeth and retained root segments. ENDODONTIC:Patient with a history of acute pulpitis symptoms. There are multiple areas of periapical pathology and radiolucency. CROWN AND BRIDGE:There are no crown restorations noted. PROSTHODONTIC:There are no partial dentures, but the patient is interested in complete dentures. Overall prognosis for successful denture wearing will be limited due to significant gag reflex. OCCLUSION:Patient with a poor occlusal scheme secondary to multiple missing teeth, multiple retained root segments, and lack of replacement of missing teeth with dental prostheses.  RADIOGRAPHIC INTERPRETATION: A panoramic radiograph was obtained. A full series of dental radiographs was attempted but unable to be obtained secondary to significant gag  reflex. There multiple missing teeth. There multiple retained root segments. There multiple  areas of periapical pathology and radiolucency. Dental caries are noted. Moderate to severe bone loss is noted.  ASSESSMENTS: 1. Chronic apical periodontitis 2. History of acute pulpitis 3. Multiple retained root segments 4. Bilateral mandibular lingual tori 5. Small palatal torus 6. Rampant dental caries 7. Chronic periodontitis of bone loss 8. Plaque and calculus accumulations 9. Gingival recession 10. Tooth mobility 11. Poor occlusal scheme and malocclusion 12. 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 Significant medical co-morbidities with risk for complications up to and including death and possible stroke.  PLAN/RECOMMENDATIONS: 1. I discussed the risks, benefits, and complications of various treatment options with the patient in relationship to his medical and dental conditions. We discussed various treatment options to include no treatment, multiple extractions with alveoloplasty, pre-prosthetic surgery as indicated, periodontal therapy, dental restorations, root canal therapy, crown and bridge therapy, implant therapy, and replacement of missing teeth as indicated. The patient currently wishes to proceed with the extraction of all remaining teeth with alveoloplasty and pre-prosthetic surgery as indicated in the operating room with general anesthesia. This has tentatively been scheduled for Thursday, November 14 at 7:30 AM at [facility] operating room. After adequate healing, the patient will then proceed with upper and lower complete denture fabrication by the dentist of his choice.   2. Discussion of findings with medical team and coordination of future medical and dental care as indicated.   Charlynne Pander, DDS

## 2011-11-25 NOTE — Telephone Encounter (Signed)
Received call from Dr.Kulinski. He saw pt today and called to discuss surgical clearance for pt to have teeth removed.  Surgery is being planned for December 01, 2011.  I told Dr. Kristin Bruins I would send message to Dr. Clifton James. Dr. Kristin Bruins also will send note to Dr. Clifton James.

## 2011-11-25 NOTE — Patient Instructions (Addendum)
Proceed with multiple extractions with alveoloplasty and pre-prosthetic surgery as indicated in the operating room after further discussion with Dr. Earney Hamburg (cardiologist) to  obtain cardiac clearance as indicated. Operating room procedure is tentatively scheduled for 12/01/2011 at 7:30 AM at [facility] Patient to then followup for dentist of his choice for fabrication of upper and lower complete dentures after adequate healing.  Dr. Kristin Bruins

## 2011-11-28 ENCOUNTER — Encounter: Payer: Self-pay | Admitting: Cardiovascular Disease

## 2011-11-28 NOTE — Telephone Encounter (Signed)
I will write a letter. cdm

## 2011-11-30 ENCOUNTER — Encounter (HOSPITAL_COMMUNITY)
Admission: RE | Admit: 2011-11-30 | Discharge: 2011-11-30 | Disposition: A | Discharge status: Home / Self Care | Payer: PRIVATE HEALTH INSURANCE | Source: Ambulatory Visit | Attending: Anesthesiology | Admitting: Anesthesiology

## 2011-11-30 ENCOUNTER — Encounter (HOSPITAL_COMMUNITY)
Admission: RE | Admit: 2011-11-30 | Discharge: 2011-11-30 | Disposition: A | Discharge status: Home / Self Care | Payer: PRIVATE HEALTH INSURANCE | Source: Ambulatory Visit | Attending: Dentistry | Admitting: Dentistry

## 2011-11-30 ENCOUNTER — Encounter (HOSPITAL_COMMUNITY): Payer: Self-pay

## 2011-11-30 HISTORY — DX: Spotted fever due to Rickettsia rickettsii: A77.0

## 2011-11-30 LAB — BASIC METABOLIC PANEL
BUN: 29 mg/dL — ABNORMAL HIGH (ref 6–23)
Calcium: 9.9 mg/dL (ref 8.4–10.5)
Creatinine, Ser: 1.13 mg/dL (ref 0.50–1.35)
GFR calc non Af Amer: 63 mL/min — ABNORMAL LOW (ref >90)
Glucose, Bld: 270 mg/dL — ABNORMAL HIGH (ref 70–99)
Sodium: 138 mEq/L (ref 135–145)

## 2011-11-30 LAB — SURGICAL PCR SCREEN: MRSA, PCR: NEGATIVE

## 2011-11-30 LAB — CBC
MCH: 32.3 pg (ref 26.0–34.0)
MCHC: 34.5 g/dL (ref 30.0–36.0)
Platelets: 250 10*3/uL (ref 150–400)

## 2011-11-30 MED ORDER — CEFAZOLIN SODIUM-DEXTROSE 2-3 GM-% IV SOLR
2.0000 g | Freq: Once | INTRAVENOUS | Status: AC
  Administered 2011-12-01: 2 g via INTRAVENOUS
  Filled 2011-11-30: qty 50

## 2011-11-30 NOTE — Pre-Procedure Instructions (Signed)
[address] Tevita Gomer [name]  11/30/2011   Your procedure is scheduled on:  Thursday December 11, 2011  Report to [facility] Stay Center at 5:30 AM.  Call this number if you have problems the morning of surgery: [phone]   Remember:   Do not eat food or drink:After Midnight.      Take these medicines the morning of surgery with A SIP OF WATER: allopurinol, carvedilol, celexa, finasteride, hydrocodone, flomax, topomax   Do not wear jewelry, make-up or nail polish.  Do not wear lotions, powders, or perfumes.  Do not shave 48 hours prior to surgery. Men may shave face and neck.  Do not bring valuables to the hospital.  Contacts, dentures or bridgework may not be worn into surgery.  Leave suitcase in the car. After surgery it may be brought to your room.  For patients admitted to the hospital, checkout time is 11:00 AM the day of discharge.   Patients discharged the day of surgery will not be allowed to drive home.  Name and phone number of your driver: family / friend  Special Instructions: Shower using CHG 2 nights before surgery and the night before surgery.  If you shower the day of surgery use CHG.  Use special wash - you have one bottle of CHG for all showers.  You should use approximately 1/3 of the bottle for each shower.   Please read over the following fact sheets that you were given: Pain Booklet, Coughing and Deep Breathing, MRSA Information and Surgical Site Infection Prevention

## 2011-11-30 NOTE — Progress Notes (Signed)
Contacted Dr. Gypsy Balsam regarding abnormal glucose level 270.  Order given by Dr. Gypsy Balsam to recheck blood sugar day of surgery.

## 2011-11-30 NOTE — Progress Notes (Signed)
11/30/11 1339  OBSTRUCTIVE SLEEP APNEA  Score 4 or greater  Results sent to PCP

## 2011-11-30 NOTE — Consult Note (Signed)
Anesthesia chart review: Patient is a 71 year old male scheduled for multiple teeth extraction with alveoloplasty by Dr. Kristin Bruins on 12/01/2011. History reviewed and includes CAD/MI s/p CABG X 05/15/08 (sequential LIMA to mid and distal LAD, SVG to DIAG, SVG to OM), ischemic cardiomyopathy with EF 40% 12/2010, hypertension, CVA, DM2, OSA (Dr. Shelle Iron) without CPAP use (due to anxiety), former smoker, obesity.  He has also done chelation therapy in the past  PCP is Dr. Oliver Barre. Cardiologist is Dr. Clifton James.  Dr. Clifton James was notified of the above procedure and did not feel that further cardiac testing was needed pre-operatively.  (See letter under Letters tab.)  EKG at [facility] on 11/03/11 showed NSR, LAFB, LVH with QRS widening and repolarization abnormality, possible lateral infarct (age undetermined).  Echo on 01/05/11 showed: - Left ventricle: Technically limited study. Septal dyssynergy. There is hypokinesis of the inferior wall. The EF is in the 40% range. The cavity size was normal. Wall thickness was increased in a pattern of moderate LVH. - Aortic valve: Sclerosis without stenosis. - Left atrium: The atrium was moderately dilated. - Right ventricle: The cavity size was mildly dilated. Systolic function was mildly reduced.  His last cardiac cath and cardiac MRI were done in September 2010 prior to his CABG.  Last stress test was in August 2010.  Chest x-ray on 11/30/2011 showed stable cardiomegaly. No active lung disease.   Labs noted.  K 5.0.  Cr 1.13.  Glucose 270.  CBC WNL.  CBG on arrival.  Anticipate he can proceed if no significant change in his status.  Shonna Chock, PA-C

## 2011-11-30 NOTE — Progress Notes (Signed)
Discussed cardiac history with Revonda Standard, clearance not in epic from Dr. Clifton James.  Ok to proceed with surgery.

## 2011-12-01 ENCOUNTER — Encounter (HOSPITAL_COMMUNITY): Payer: Self-pay | Admitting: Vascular Surgery

## 2011-12-01 ENCOUNTER — Encounter (HOSPITAL_COMMUNITY): Payer: Self-pay | Admitting: *Deleted

## 2011-12-01 ENCOUNTER — Other Ambulatory Visit: Payer: Self-pay | Admitting: Internal Medicine

## 2011-12-01 ENCOUNTER — Encounter (HOSPITAL_COMMUNITY)
Admission: RE | Disposition: A | Discharge status: Home / Self Care | Payer: Self-pay | Source: Ambulatory Visit | Attending: Dentistry

## 2011-12-01 ENCOUNTER — Ambulatory Visit (HOSPITAL_COMMUNITY)
Admission: RE | Admit: 2011-12-01 | Discharge: 2011-12-01 | Disposition: A | Discharge status: Home / Self Care | Payer: PRIVATE HEALTH INSURANCE | Source: Ambulatory Visit | Attending: Dentistry | Admitting: Dentistry

## 2011-12-01 ENCOUNTER — Ambulatory Visit (HOSPITAL_COMMUNITY): Payer: PRIVATE HEALTH INSURANCE | Admitting: Vascular Surgery

## 2011-12-01 DIAGNOSIS — K029 Dental caries, unspecified: Secondary | ICD-10-CM | POA: Diagnosis present

## 2011-12-01 DIAGNOSIS — I252 Old myocardial infarction: Secondary | ICD-10-CM | POA: Insufficient documentation

## 2011-12-01 DIAGNOSIS — K083 Retained dental root: Secondary | ICD-10-CM | POA: Insufficient documentation

## 2011-12-01 DIAGNOSIS — I519 Heart disease, unspecified: Secondary | ICD-10-CM

## 2011-12-01 DIAGNOSIS — Z01812 Encounter for preprocedural laboratory examination: Secondary | ICD-10-CM | POA: Insufficient documentation

## 2011-12-01 DIAGNOSIS — K0530 Chronic periodontitis, unspecified: Secondary | ICD-10-CM | POA: Diagnosis present

## 2011-12-01 DIAGNOSIS — K045 Chronic apical periodontitis: Secondary | ICD-10-CM | POA: Insufficient documentation

## 2011-12-01 DIAGNOSIS — Z951 Presence of aortocoronary bypass graft: Secondary | ICD-10-CM | POA: Insufficient documentation

## 2011-12-01 DIAGNOSIS — Z01818 Encounter for other preprocedural examination: Secondary | ICD-10-CM | POA: Insufficient documentation

## 2011-12-01 DIAGNOSIS — K044 Acute apical periodontitis of pulpal origin: Secondary | ICD-10-CM

## 2011-12-01 DIAGNOSIS — G479 Sleep disorder, unspecified: Secondary | ICD-10-CM

## 2011-12-01 DIAGNOSIS — M278 Other specified diseases of jaws: Secondary | ICD-10-CM

## 2011-12-01 DIAGNOSIS — I10 Essential (primary) hypertension: Secondary | ICD-10-CM | POA: Insufficient documentation

## 2011-12-01 DIAGNOSIS — I2510 Atherosclerotic heart disease of native coronary artery without angina pectoris: Secondary | ICD-10-CM | POA: Insufficient documentation

## 2011-12-01 DIAGNOSIS — Z794 Long term (current) use of insulin: Secondary | ICD-10-CM | POA: Insufficient documentation

## 2011-12-01 DIAGNOSIS — E119 Type 2 diabetes mellitus without complications: Secondary | ICD-10-CM | POA: Insufficient documentation

## 2011-12-01 HISTORY — PX: MULTIPLE EXTRACTIONS WITH ALVEOLOPLASTY: SHX5342

## 2011-12-01 LAB — GLUCOSE, CAPILLARY: Glucose-Capillary: 160 mg/dL — ABNORMAL HIGH (ref 70–99)

## 2011-12-01 SURGERY — MULTIPLE EXTRACTION WITH ALVEOLOPLASTY
Anesthesia: General | Site: Mouth | Wound class: Clean Contaminated

## 2011-12-01 MED ORDER — BUPIVACAINE-EPINEPHRINE 0.5% -1:200000 IJ SOLN
INTRAMUSCULAR | Status: DC | PRN
  Administered 2011-12-01: 3.6 mL

## 2011-12-01 MED ORDER — BUPIVACAINE-EPINEPHRINE (PF) 0.5% -1:200000 IJ SOLN
INTRAMUSCULAR | Status: AC
  Filled 2011-12-01: qty 10.8

## 2011-12-01 MED ORDER — MIDAZOLAM HCL 5 MG/5ML IJ SOLN
INTRAMUSCULAR | Status: DC | PRN
  Administered 2011-12-01: 2 mg via INTRAVENOUS

## 2011-12-01 MED ORDER — OXYMETAZOLINE HCL 0.05 % NA SOLN
NASAL | Status: DC | PRN
  Administered 2011-12-01: 1 via NASAL

## 2011-12-01 MED ORDER — ONDANSETRON HCL 4 MG/2ML IJ SOLN
INTRAMUSCULAR | Status: DC | PRN
  Administered 2011-12-01: 4 mg via INTRAVENOUS

## 2011-12-01 MED ORDER — OXYCODONE HCL 5 MG/5ML PO SOLN: 5.0000 mg | Freq: Once | ORAL | Status: DC | PRN

## 2011-12-01 MED ORDER — ONDANSETRON HCL 4 MG/2ML IJ SOLN: 4.0000 mg | Freq: Four times a day (QID) | INTRAMUSCULAR | Status: DC | PRN

## 2011-12-01 MED ORDER — NEOSTIGMINE METHYLSULFATE 1 MG/ML IJ SOLN
INTRAMUSCULAR | Status: DC | PRN
  Administered 2011-12-01: 4 mg via INTRAVENOUS

## 2011-12-01 MED ORDER — CARVEDILOL 3.125 MG PO TABS
ORAL_TABLET | ORAL | Status: AC
  Administered 2011-12-01: 6.25 mg
  Filled 2011-12-01: qty 2

## 2011-12-01 MED ORDER — LIDOCAINE HCL (CARDIAC) 20 MG/ML IV SOLN
INTRAVENOUS | Status: DC | PRN
  Administered 2011-12-01: 80 mg via INTRAVENOUS

## 2011-12-01 MED ORDER — GLYCOPYRROLATE 0.2 MG/ML IJ SOLN
INTRAMUSCULAR | Status: DC | PRN
  Administered 2011-12-01: .6 mg via INTRAVENOUS

## 2011-12-01 MED ORDER — PHENYLEPHRINE HCL 10 MG/ML IJ SOLN
10.0000 mg | INTRAVENOUS | Status: DC | PRN
  Administered 2011-12-01: 20 ug/min via INTRAVENOUS

## 2011-12-01 MED ORDER — CARVEDILOL 6.25 MG PO TABS
6.2500 mg | ORAL_TABLET | Freq: Two times a day (BID) | ORAL | Status: DC
  Filled 2011-12-01 (×2): qty 1

## 2011-12-01 MED ORDER — OXYCODONE HCL 5 MG PO TABS: 5.0000 mg | ORAL_TABLET | Freq: Once | ORAL | Status: DC | PRN

## 2011-12-01 MED ORDER — FENTANYL CITRATE 0.05 MG/ML IJ SOLN: 25.0000 ug | INTRAMUSCULAR | Status: DC | PRN

## 2011-12-01 MED ORDER — FENTANYL CITRATE 0.05 MG/ML IJ SOLN
INTRAMUSCULAR | Status: DC | PRN
  Administered 2011-12-01: 100 ug via INTRAVENOUS
  Administered 2011-12-01: 50 ug via INTRAVENOUS

## 2011-12-01 MED ORDER — LIDOCAINE-EPINEPHRINE 2 %-1:100000 IJ SOLN
INTRAMUSCULAR | Status: AC
  Filled 2011-12-01: qty 13.6

## 2011-12-01 MED ORDER — OXYMETAZOLINE HCL 0.05 % NA SOLN
NASAL | Status: AC
  Filled 2011-12-01: qty 15

## 2011-12-01 MED ORDER — LIDOCAINE-EPINEPHRINE 2 %-1:100000 IJ SOLN
INTRAMUSCULAR | Status: DC | PRN
  Administered 2011-12-01: 10.2 mL

## 2011-12-01 MED ORDER — ROCURONIUM BROMIDE 100 MG/10ML IV SOLN
INTRAVENOUS | Status: DC | PRN
  Administered 2011-12-01: 50 mg via INTRAVENOUS

## 2011-12-01 MED ORDER — PROPOFOL 10 MG/ML IV BOLUS
INTRAVENOUS | Status: DC | PRN
  Administered 2011-12-01: 200 mg via INTRAVENOUS
  Administered 2011-12-01: 50 mg via INTRAVENOUS

## 2011-12-01 MED ORDER — LACTATED RINGERS IV SOLN
INTRAVENOUS | Status: DC | PRN
  Administered 2011-12-01 (×2): via INTRAVENOUS

## 2011-12-01 MED ORDER — OXYCODONE-ACETAMINOPHEN 5-325 MG PO TABS: ORAL_TABLET | ORAL | Status: DC

## 2011-12-01 MED ORDER — OXYMETAZOLINE HCL 0.05 % NA SOLN
NASAL | Status: DC | PRN
  Administered 2011-12-01: 3 via NASAL
  Administered 2011-12-01: 2 via NASAL

## 2011-12-01 SURGICAL SUPPLY — 34 items
ALCOHOL 70% 16 OZ (MISCELLANEOUS) ×2 IMPLANT
ATTRACTOMAT 16X20 MAGNETIC DRP (DRAPES) ×2 IMPLANT
BLADE SURG 15 STRL LF DISP TIS (BLADE) ×2 IMPLANT
BLADE SURG 15 STRL SS (BLADE) ×2
CLOTH BEACON ORANGE TIMEOUT ST (SAFETY) ×2 IMPLANT
COVER SURGICAL LIGHT HANDLE (MISCELLANEOUS) ×2 IMPLANT
CRADLE DONUT ADULT HEAD (MISCELLANEOUS) ×2 IMPLANT
GAUZE PACKING FOLDED 2  STR (GAUZE/BANDAGES/DRESSINGS) ×1
GAUZE PACKING FOLDED 2 STR (GAUZE/BANDAGES/DRESSINGS) ×1 IMPLANT
GAUZE SPONGE 4X4 16PLY XRAY LF (GAUZE/BANDAGES/DRESSINGS) ×2 IMPLANT
GLOVE SURG ORTHO 8.0 STRL STRW (GLOVE) ×2 IMPLANT
GLOVE SURG SS PI 6.5 STRL IVOR (GLOVE) ×2 IMPLANT
GOWN STRL REIN 3XL LVL4 (GOWN DISPOSABLE) ×2 IMPLANT
HEMOSTAT SURGICEL .5X2 ABSORB (HEMOSTASIS) IMPLANT
KIT BASIN OR (CUSTOM PROCEDURE TRAY) ×2 IMPLANT
KIT ROOM TURNOVER OR (KITS) ×2 IMPLANT
MANIFOLD NEPTUNE WASTE (CANNULA) ×2 IMPLANT
NEEDLE BLUNT 16X1.5 OR ONLY (NEEDLE) ×2 IMPLANT
NEEDLE DENTAL 27 LONG (NEEDLE) IMPLANT
NS IRRIG 1000ML POUR BTL (IV SOLUTION) ×2 IMPLANT
PACK EENT II TURBAN DRAPE (CUSTOM PROCEDURE TRAY) ×2 IMPLANT
PAD ARMBOARD 7.5X6 YLW CONV (MISCELLANEOUS) ×4 IMPLANT
SPONGE SURGIFOAM ABS GEL 100 (HEMOSTASIS) IMPLANT
SPONGE SURGIFOAM ABS GEL 12-7 (HEMOSTASIS) IMPLANT
SPONGE SURGIFOAM ABS GEL SZ50 (HEMOSTASIS) IMPLANT
SUCTION FRAZIER TIP 10 FR DISP (SUCTIONS) ×2 IMPLANT
SUT CHROMIC 3 0 PS 2 (SUTURE) ×4 IMPLANT
SUT CHROMIC 4 0 P 3 18 (SUTURE) IMPLANT
SYR 50ML SLIP (SYRINGE) ×2 IMPLANT
TOWEL OR 17X24 6PK STRL BLUE (TOWEL DISPOSABLE) ×2 IMPLANT
TOWEL OR 17X26 10 PK STRL BLUE (TOWEL DISPOSABLE) ×2 IMPLANT
TUBE CONNECTING 12X1/4 (SUCTIONS) ×2 IMPLANT
WATER STERILE IRR 1000ML POUR (IV SOLUTION) ×2 IMPLANT
YANKAUER SUCT BULB TIP NO VENT (SUCTIONS) ×2 IMPLANT

## 2011-12-01 NOTE — Anesthesia Preprocedure Evaluation (Addendum)
Anesthesia Evaluation  Patient identified by MRN, date of birth, ID band Patient awake    Reviewed: Allergy & Precautions, H&P , NPO status , Patient's Chart, lab work & pertinent test results  History of Anesthesia Complications Negative for: history of anesthetic complications  Airway Mallampati: II TM Distance: >3 FB Neck ROM: full    Dental  (+) Poor Dentition, Dental Advisory Given, Missing and Loose   Pulmonary sleep apnea ,          Cardiovascular hypertension, + CAD, + Past MI and + CABG + dysrhythmias     Neuro/Psych PSYCHIATRIC DISORDERS Depression TIA Neuromuscular disease CVA, No Residual Symptoms    GI/Hepatic PUD,   Endo/Other  diabetes, Type 2, Insulin Dependent and Oral Hypoglycemic Agents  Renal/GU      Musculoskeletal   Abdominal   Peds  Hematology   Anesthesia Other Findings   Reproductive/Obstetrics                         Anesthesia Physical Anesthesia Plan  ASA: III  Anesthesia Plan: General   Post-op Pain Management:    Induction: Intravenous  Airway Management Planned: Oral ETT  Additional Equipment:   Intra-op Plan:   Post-operative Plan: Extubation in OR  Informed Consent: I have reviewed the patients History and Physical, chart, labs and discussed the procedure including the risks, benefits and alternatives for the proposed anesthesia with the patient or authorized representative who has indicated his/her understanding and acceptance.     Plan Discussed with: CRNA and Surgeon  Anesthesia Plan Comments:         Anesthesia Quick Evaluation

## 2011-12-01 NOTE — Telephone Encounter (Signed)
Ok to refer to pulm for ? osa after recent hosp screening

## 2011-12-01 NOTE — Anesthesia Postprocedure Evaluation (Signed)
  Anesthesia Post-op Note  Patient: [name]  Procedure(s) Performed: Procedure(s) (LRB) with comments: MULTIPLE EXTRACION WITH ALVEOLOPLASTY (N/A) - Extraction of tooth #'s 3,4,5,6,7,8,9,10,11,12,13,14,15,16,17,20,21,22,23,24,25,26,27,28,29,30,31,32 with alveoloplasty and bilateral mandibular lingual tori  Patient Location: PACU and Short Stay  Anesthesia Type:General  Level of Consciousness: awake, oriented, sedated and patient cooperative  Airway and Oxygen Therapy: Patient Spontanous Breathing  Post-op Pain: mild  Post-op Assessment: Post-op Vital signs reviewed, Patient's Cardiovascular Status Stable, Respiratory Function Stable, Patent Airway, No signs of Nausea or vomiting and Pain level controlled  Post-op Vital Signs: stable  Complications: No apparent anesthesia complications

## 2011-12-01 NOTE — Preoperative (Signed)
Beta Blockers   Reason not to administer Beta Blockers:Not Applicable 

## 2011-12-01 NOTE — Transfer of Care (Signed)
Immediate Anesthesia Transfer of Care Note  Patient: [name]  Procedure(s) Performed: Procedure(s) (LRB) with comments: MULTIPLE EXTRACION WITH ALVEOLOPLASTY (N/A) - Extraction of tooth #'s 3,4,5,6,7,8,9,10,11,12,13,14,15,16,17,20,21,22,23,24,25,26,27,28,29,30,31,32 with alveoloplasty and bilateral mandibular lingual tori  Patient Location: PACU  Anesthesia Type:General  Level of Consciousness: awake, alert  and oriented  Airway & Oxygen Therapy: Patient Spontanous Breathing and Patient connected to face mask oxygen  Post-op Assessment: Report given to PACU RN and Post -op Vital signs reviewed and stable  Post vital signs: Reviewed and stable  Complications: No apparent anesthesia complications

## 2011-12-01 NOTE — Progress Notes (Signed)
PRE-OPERATIVE NOTE:  12/01/2011 [name] [id number]  VITALS: BP 131/80  Pulse 69  Temp 97.9 F (36.6 C) (Oral)  Resp 18  SpO2 94%  Lab Results  Component Value Date   WBC 8.0 11/30/2011   HGB 14.3 11/30/2011   HCT 41.5 11/30/2011   MCV 93.7 11/30/2011   PLT 250 11/30/2011   BMET    Component Value Date/Time   NA 138 11/30/2011 1319   K 5.0 11/30/2011 1319   CL 104 11/30/2011 1319   CO2 22 11/30/2011 1319   GLUCOSE 270* 11/30/2011 1319   BUN 29* 11/30/2011 1319   CREATININE 1.13 11/30/2011 1319   CALCIUM 9.9 11/30/2011 1319   GFRNONAA 63* 11/30/2011 1319   GFRAA 73* 11/30/2011 1319    Lab Results  Component Value Date   INR 0.99 08/02/2009   INR 0.93 03/16/2009   INR 1.2 10/15/2008   No results found for this basename: PTT     [name]  presents for dental procedures in the operating room.   SUBJECTIVE: The patient denies any acute medical or dental changes and agrees to proceed with treatment as planned.  EXAM: No sign of acute dental changes.  ASSESSMENT: Patient is affected by multiple retained root segments, apical periodontitis, chronic periodontitis, mandibular tori, and dental caries.  PLAN: Patient agrees to proceed with treatment as planned in the operating room as previously discussed and accepts the risks, benefits, complications of the proposed treatment.   Charlynne Pander, DDS

## 2011-12-01 NOTE — Op Note (Signed)
Patient:            [name] Date of Birth:  1940-11-17 MRN:                [id number]   DATE OF PROCEDURE:  12/01/2011               OPERATIVE REPORT   PREOPERATIVE DIAGNOSES: 1. Coronary artery disease-status post coronary artery bypass graft procedure 2. Chronic apical periodontitis 3. Multiple retained root segments 4. Rampant dental caries 5. Chronic periodontitis 6. Bilateral mandibular lingual tori  POSTOPERATIVE DIAGNOSES: 1. Coronary artery disease-status post coronary artery bypass graft procedure 2. Chronic apical periodontitis 3. Multiple retained root segments 4. Rampant dental caries 5. Chronic periodontitis 6. Bilateral mandibular lingual tori  OPERATIONS: 1. Multiple extraction of tooth numbers 3, 4, 5, 6, 7, 8, 9, 10, 11, 12, 13, 14, 15, 16, 17, 20, 21, 22, 23, 24, 25, 26, 27, 20, 29, 30, 31, and 32. 2. 4 Quadrants of alveoloplasty 3. Bilateral mandibular lingual tori reductions   SURGEON: Charlynne Pander, DDS  ASSISTANT: Zettie Pho, (dental assistant)  ANESTHESIA: General anesthesia via nasoendotracheal tube.  MEDICATIONS: 1. Ancef 2 g IV prior to invasive dental procedures. 2. Local anesthesia with a total utilization of 6 carpules each containing 34 mg of lidocaine with 0.017 mg of epinephrine as well as 2 carpules each containing 9 mg of bupivacaine with 0.009 mg of epinephrine.  SPECIMENS: There are 28 teeth that were discarded.  DRAINS: None  CULTURES: None  COMPLICATIONS: None   ESTIMATED BLOOD LOSS: 100 mLs.  INTRAVENOUS FLUIDS: 1400 mLs of Lactated ringers solution.  INDICATIONS: The patient was previously diagnosed with coronary artery disease and is status post coronary artery bypass procedure.  A dental consultation was then requested to rule out dental infection that may affect the patient's systemic health and overall cardiovascular health.  The patient was examined and treatment planned for extraction of  remaining teeth with alveoloplasty and pre-prosthetic surgery as indicated.  OPERATIVE FINDINGS: Patient was examined operating room number 7.  The teeth were identified for extraction. The patient was noted be affected by chronic periodontitis, chronic apical periodontitis, multiple retained root segments, rampant dental caries, and bilateral mandibular lingual tori.   DESCRIPTION OF PROCEDURE: Patient was brought to the main operating room number 7. Patient was then placed in the supine position on the operating table. General Anesthesia was then induced per the anesthesia team. The patient was then prepped and draped in the usual manner for dental medicine procedure. A timeout was performed. The patient was identified and procedures were verified. A throat pack was placed at this time. The oral cavity was then thoroughly examined with the findings noted above. The patient was then ready for dental medicine procedure as follows:  Local anesthesia was then administered sequentially with a total utilization of 6 carpules each containing 34 mg of lidocaine with 0.017 mg of epinephrine as well as 2 carpules  each containing 9 mg bupivacaine with 0.009 mg of epinephrine.  The Maxillary left and right quadrants first approached. Anesthesia was then delivered utilizing infiltration with lidocaine with epinephrine. A #15 blade incision was then made from the distal of #2 and extended to the maxillary left tuberosity.  A  surgical flap was then carefully reflected. Appropriate amounts of buccal and interseptal bone were then removed utilizing a surgical handpiece and bur and copious amounts of sterile water.  The teeth were then subluxated with a series of straight elevators.  Tooth numbers 3, 4, 5, 6, 7, 8, 9, 10, 11, 12, 13, 14, 15, and 16 were then removed with a 150 forceps without complications. Alveoloplasty was then performed utilizing a ronguers and bone file. The surgical site was then irrigated with  copious amounts of sterile saline. The tissues were approximated and trimmed appropriately. The surgical site was then closed from the maxillary left tuberosity and extended the mesial #9 utilizing 3-0 chromic gut suture in a continuous interrupted suture technique x1. The maxillary right surgical site was then closed from the distal of #2 and extended to the mesial of #8 utilizing 3-0 chromic gut suture in a continuous interrupted suture technique x1.  At this point time, the mandibular quadrants were approached. The patient was given bilateral inferior alveolar nerve blocks and long buccal nerve blocks utilizing the bupivacaine with epinephrine. Further infiltration was then achieved utilizing the lidocaine with epinephrine. A 15 blade incision was then made from the distal of number of #17 and extended to the distal of #32 .  A surgical flap was then carefully reflected. Appropriate amounts of buccal and interseptal bone were then removed appropriately. Tooth numbers 17, 20, 21, 22, 23, 24, 25, 26, 27, 28, 29, 30, 31, and 32 were then removed utilizing a 151 forceps. Alveoloplasty was then performed utilizing a rongeurs and bone file. At this point time, the flaps were reflected to expose the bilateral mandibular lingual tori. The mandibular left and mandibular right tori were then reduced utilizing a surgical handpiece and bur and copious amounts sterile water. Further alveoloplasty was performed with a rongeur and bone file as indicated. The tissues were approximated and trimmed appropriately. The surgical sites were then irrigated with copious amounts of sterile saline x4. The mandibular left surgical site was then closed from the distal of 17 and extended the mesial #24 utilizing 3-0 chromic gut suture in a continuous interrupted suture technique x1. The mandibular right surgical site was then closed from the distal of #32 and extended to the mesial of #25 utilizing 3-0 chromic gut suture in a continuous  interrupted suture technique x1.   At this point time, the entire mouth was irrigated with copious amounts of sterile saline. The patient was exam for complications, seeing none, the dental medicine procedure was deemed to be complete. The throat pack was removed at this time. A series of 4 x 4 gauze were placed in the mouth to aid hemostasis. The patient was then handed over to the anesthesia team for final disposition. After an appropriate amount of time, the patient was extubated and taken to the postanesthsia care unit with stable vital signs and a good condition. All counts were correct for the dental medicine procedure. The patient is planning on being discharged to home. If postoperative complications occur, the patient will be admitted by [facility] cardiology team. Patient was given a prescription for the Percocet 5/325. Patient is to take one to 2 tablets every 6 hours as needed for pain. Patient is not to take the Percocet at the same time as the Lortab or hydrocodone with acetaminophen that he has at home. Patient is to return to clinic in 7-10 days for evaluation for suture removal. Dentures to be fabricated by the dentist of his choice after adequate healing.   Charlynne Pander, DDS.

## 2011-12-02 ENCOUNTER — Encounter (HOSPITAL_COMMUNITY): Payer: Self-pay | Admitting: Dentistry

## 2011-12-05 ENCOUNTER — Encounter: Payer: Self-pay | Admitting: Internal Medicine

## 2011-12-05 ENCOUNTER — Ambulatory Visit (INDEPENDENT_AMBULATORY_CARE_PROVIDER_SITE_OTHER): Payer: PRIVATE HEALTH INSURANCE | Admitting: Internal Medicine

## 2011-12-05 VITALS — BP 102/52 | HR 67 | Temp 98.4°F | Ht 68.5 in | Wt 230.5 lb

## 2011-12-05 DIAGNOSIS — I10 Essential (primary) hypertension: Secondary | ICD-10-CM

## 2011-12-05 DIAGNOSIS — G8929 Other chronic pain: Secondary | ICD-10-CM

## 2011-12-05 DIAGNOSIS — E119 Type 2 diabetes mellitus without complications: Secondary | ICD-10-CM

## 2011-12-05 MED ORDER — HYDROCODONE-ACETAMINOPHEN 10-500 MG PO TABS: 1.0000 | ORAL_TABLET | Freq: Four times a day (QID) | ORAL | Status: DC | PRN

## 2011-12-05 NOTE — Assessment & Plan Note (Signed)
stable overall by hx and exam, most recent data reviewed with pt, and pt to continue medical treatment as before BP Readings from Last 3 Encounters:  12/05/11 102/52  12/01/11 158/91  12/01/11 158/91

## 2011-12-05 NOTE — Assessment & Plan Note (Signed)
At max dose lantus for one injection, to change to lantus 60am/50 pm bid dosing, still may need uptitration of am sugars, to continue to monitor closely for low sugars

## 2011-12-05 NOTE — Patient Instructions (Addendum)
Please increase the lantus to 60 units in the AM, and 50 units in the PM Continue all other medications as before Thank you for enrolling in MyChart. Please follow the instructions below to securely access your online medical record. MyChart allows you to send messages to your doctor, view your test results, renew your prescriptions, schedule appointments, and more. To Log into MyChart, please go to [email or web address], and your Username is: [email or web address] Your pain medication is refilled today hardcopy Please return in 3 months, or sooner if needed

## 2011-12-05 NOTE — Assessment & Plan Note (Signed)
stable overall by hx and exam, most recent data reviewed with pt, and pt to continue medical treatment as before . For pain med refil

## 2011-12-05 NOTE — Progress Notes (Signed)
Subjective:    Patient ID: [name], male    DOB: [birth year], 71 y.o.   MRN: [id number]  HPI  Here to f/u; overall pain ok but needs pain med refill.  Wife remains supportive, has been uptitrating lantus with some improvement in recent a1c from 15 to most recently approx 10% and lantus now at 100 units per day with cbg's still at 250-307.  Pt denies chest pain, increased sob or doe, wheezing, orthopnea, PND, increased LE swelling, palpitations, dizziness or syncope.  Pt denies new neurological symptoms such as new headache, or facial or extremity weakness or numbness   Pt denies polydipsia, polyuria, or low sugar symptoms such as weakness or confusion improved with po intake.  Pt states overall good compliance with meds, trying to follow diabetic diet, wt overall stable.    Recent percodet made him "loopy" so did not take Past Medical History  Diagnosis Date  . CAD (coronary artery disease)     s/p 4V CABG 10/15/08; EF is 50%  . Obesity   . BPH (benign prostatic hypertrophy)   . Edema   . Left ventricular systolic dysfunction     EF is 40% per echo December 2012  . AMI, INFERIOR WALL 09/30/2008    Qualifier: Diagnosis of  By: Sherral Hammers, RN, BSN, Melanie    . CAD, ARTERY BYPASS GRAFT 11/18/2008    Qualifier: Diagnosis of  By: Clifton James, MD, Cristal Deer    . 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 UNIVERSAL ULCERATIVE COLITIS 07/31/2007    Qualifier: Diagnosis of  By: Arlyce Dice MD, Barbette Hair   . Ischemic cardiomyopathy 05/05/2011  . DM 09/01/2008    Qualifier: Diagnosis of  By: Kem Parkinson    . OBSTRUCTIVE SLEEP APNEA 09/16/2008    Qualifier: Diagnosis of  By: Shelle Iron MD, Maree Krabbe   . HYPERTENSION 09/01/2008    Qualifier: Diagnosis of  By:  Kem Parkinson    . Hyperlipidemia 05/18/2010  . Gout 09/23/2011  . Bipolar affective disorder 09/23/2011  . TIA (transient ischemic attack) 09/23/2011  . Chronic pain 09/23/2011  . DNR (do not resuscitate) 09/23/2011    Per July 2011 [facility]  . CVA (cerebral infarction) 09/23/2011    Small left occipital  . 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 History of alcohol abuse 09/23/2011  . Blood in stool   . Depression   . Urine incontinence   . Stroke     "mini strokes"  . Incontinence of urine   . Neuromuscular disorder     Diabetic neuropathy  . [place] spotted fever    Past Surgical History  Procedure Date  . Coronary artery bypass graft     Median sternotomy, extracorporeal circulation,  coronary artery bypass graft surgery x4 using a sequential left internal   mammary artery graft to the mid and distal left anterior descending, a   saphenous vein graft to diagonal branch of the left anterior descending,   and a saphenous vein graft to the obtuse marginal branch of left   circumflex coronary artery.    . Cardiac catheterization     Triple-vessel coronary artery disease. Low-normal left ventricular systolic function with mild   anteroapical wall motion abnormality.   . Tonsilectomy, adenoidectomy, bilateral myringotomy and tubes 1946  .  Multiple extractions with alveoloplasty 12/01/2011    Procedure: MULTIPLE EXTRACION WITH ALVEOLOPLASTY;  Surgeon: Charlynne Pander, DDS;  Location: [facility] OR;  Service: Oral Surgery;  Laterality: N/A;  Extraction of tooth #'s 3,4,5,6,7,8,9,10,11,12,13,14,15,16,17,20,21,22,23,24,25,26,27,28,29,30,31,32 with alveoloplasty and bilateral mandibular lingual tori    reports that he quit smoking about 3 years ago. He has never used smokeless tobacco. He reports that he does not drink alcohol or use illicit drugs. family history includes Alcohol abuse in his others; Aortic aneurysm in his mother; Arthritis in his others; Diabetes in his father and others; Heart attack in  his mother; Heart disease in his father and others; Hyperlipidemia in his other; Hypertension in his other; Mental illness in his others; and Stroke in his other. Allergies  Allergen Reactions  . Dexedrine (Dextroamphetamine Sulfate Er)     agitation  . Oxycodone     nausea   Current Outpatient Prescriptions on File Prior to Visit  Medication Sig Dispense Refill  . allopurinol (ZYLOPRIM) 300 MG tablet Take 300 mg by mouth daily.       [name] Kitchen aspirin 81 MG tablet Take 81 mg by mouth daily.        . carvedilol (COREG) 6.25 MG tablet Take 6.25 mg by mouth 2 (two) times daily with a meal.      . Cholecalciferol (VITAMIN D3) 2000 UNITS TABS Take 2,000 Units by mouth daily.      . citalopram (CELEXA) 20 MG tablet Take 1 tablet (20 mg total) by mouth daily.  90 tablet  3  . fenofibrate 160 MG tablet Take 1 tablet (160 mg total) by mouth daily.  90 tablet  3  . finasteride (PROSCAR) 5 MG tablet Take 1 tablet (5 mg total) by mouth daily.  90 tablet  3  . furosemide (LASIX) 40 MG tablet Take 1 tablet (40 mg total) by mouth daily as needed.  30 tablet  11  . glimepiride (AMARYL) 4 MG tablet Take 1 tablet (4 mg total) by mouth daily before breakfast.  90 tablet  3  . insulin glargine (LANTUS) 100 UNIT/ML injection Inject 100 Units into the skin at bedtime.      . simvastatin (ZOCOR) 40 MG tablet Take 40 mg by mouth at bedtime.        . solifenacin (VESICARE) 10 MG tablet Take 10 mg by mouth daily.      . Tamsulosin HCl (FLOMAX) 0.4 MG CAPS Take 0.4 mg by mouth daily after supper.       . topiramate (TOPAMAX) 200 MG tablet Take 200 mg by mouth 2 (two) times daily.        . vitamin B-12 (CYANOCOBALAMIN) 1000 MCG tablet Take 1,000 mcg by mouth daily.      . [DISCONTINUED] HYDROcodone-acetaminophen (LORTAB) 10-500 MG per tablet Take 1 tablet by mouth every 6 (six) hours as needed.  60 tablet  1   Review of Systems  Constitutional: Negative for diaphoresis and unexpected weight change.  HENT: Negative for  tinnitus.   Eyes: Negative for photophobia and visual disturbance.  Respiratory: Negative for choking and stridor.   Gastrointestinal: Negative for vomiting and blood in stool.  Genitourinary: Negative for hematuria and decreased urine volume.  Musculoskeletal: Negative for acute joint swelling Skin: Negative for color change and wound.  Neurological: Negative for tremors and numbness.  Psychiatric/Behavioral: Negative for decreased concentration. The patient is not hyperactive.       Objective:   Physical Exam BP 102/52  Pulse 67  Temp 98.4 F (  36.9 C) (Oral)  Ht 5' 8.5" (1.74 m)  Wt 230 lb 8 oz (104.554 kg)  BMI 34.54 kg/m2  SpO2 94% Physical Exam  VS noted, not ill appearing Constitutional: Pt appears well-developed and well-nourished.  HENT: Head: Normocephalic.  Right Ear: External ear normal.  Left Ear: External ear normal.  Eyes: Conjunctivae and EOM are normal. Pupils are equal, round, and reactive to light.  Neck: Normal range of motion. Neck supple.  Cardiovascular: Normal rate and regular rhythm.   Pulmonary/Chest: Effort normal and breath sounds normal.  Neurological: Pt is alert Skin: Skin is warm. No erythema.  Psychiatric: Pt behavior is normal.    Assessment & Plan:

## 2011-12-12 ENCOUNTER — Ambulatory Visit (HOSPITAL_COMMUNITY): Payer: Self-pay | Admitting: Dentistry

## 2011-12-12 ENCOUNTER — Encounter (HOSPITAL_COMMUNITY): Payer: Self-pay | Admitting: Dentistry

## 2011-12-12 ENCOUNTER — Other Ambulatory Visit: Payer: Self-pay

## 2011-12-12 VITALS — BP 157/83 | HR 66 | Temp 98.1°F

## 2011-12-12 DIAGNOSIS — K08199 Complete loss of teeth due to other specified cause, unspecified class: Secondary | ICD-10-CM

## 2011-12-12 DIAGNOSIS — K08109 Complete loss of teeth, unspecified cause, unspecified class: Secondary | ICD-10-CM

## 2011-12-12 DIAGNOSIS — K0820 Unspecified atrophy of edentulous alveolar ridge: Secondary | ICD-10-CM

## 2011-12-12 MED ORDER — ALLOPURINOL 300 MG PO TABS: 300.0000 mg | ORAL_TABLET | Freq: Every day | ORAL | Status: DC

## 2011-12-12 NOTE — Patient Instructions (Signed)
PLAN: 1. Continue salt water rinses as needed to aid healing. 2. After a thorough discussion of the risks, benefits, costs, and complications of proceeding with upper and lower complete denture fabrication, the patient wished to proceed with denture fabrication here in dental medicine. Patient is to return to clinic in approximately 3 weeks or the initial impressions. Final impressions will be made in January of 2014 after adequate healing. 3. Patient to call problems arise with healing before then.   Charlynne Pander, DDS

## 2011-12-12 NOTE — Progress Notes (Addendum)
POST OPERATIVE NOTE:  12/12/2011 [name] [id number]  VITALS: BP 157/83  Pulse 66  Temp 98.1 F (36.7 C) (Oral)  [name] is status post multiple extractions with alveoloplasty and pre-prosthetic surgery as indicated in the operating room on 12/01/2011.  SUBJECTIVE: Patient denies any active pain from that dental extraction sites. Some stitches remain by report. He did have some problems with the oxycodone which caused him some nausea and vomiting. The patient was able take hydrocodone with acetaminophen without problems.  EXAM: No sign of infection, heme, or ooze. Sutures are loosely intact. Patient is now edentulous. There is atrophy of the edentulous alveolar ridges. Procedure:  30 second chlorhexidine rinse. Sutures were removed without complications.  ASSESSMENT: Post operative course is consistent with dental procedures performed in the operating room.  PLAN: 1. Continue salt water rinses as needed to aid healing. 2. After a thorough discussion of the risks, benefits, costs, and complications of proceeding with upper and lower complete denture fabrication, the patient wished to proceed with denture fabrication here in dental medicine. Patient is to return to clinic in approximately 3 weeks or the initial impressions. Final impressions will be made in January of 2014 after adequate healing. 3. Patient to call problems arise with healing before then.   Charlynne Pander, DDS

## 2011-12-22 ENCOUNTER — Institutional Professional Consult (permissible substitution): Payer: Self-pay | Admitting: Pulmonary Disease

## 2011-12-28 ENCOUNTER — Ambulatory Visit: Payer: PRIVATE HEALTH INSURANCE | Admitting: Internal Medicine

## 2012-01-02 ENCOUNTER — Ambulatory Visit (HOSPITAL_COMMUNITY): Payer: Self-pay | Admitting: Dentistry

## 2012-01-02 ENCOUNTER — Encounter (HOSPITAL_COMMUNITY): Payer: Self-pay | Admitting: Dentistry

## 2012-01-02 VITALS — BP 139/83 | HR 73 | Temp 98.1°F

## 2012-01-02 DIAGNOSIS — K0820 Unspecified atrophy of edentulous alveolar ridge: Secondary | ICD-10-CM

## 2012-01-02 DIAGNOSIS — J392 Other diseases of pharynx: Secondary | ICD-10-CM

## 2012-01-02 DIAGNOSIS — K08109 Complete loss of teeth, unspecified cause, unspecified class: Secondary | ICD-10-CM

## 2012-01-02 DIAGNOSIS — R292 Abnormal reflex: Secondary | ICD-10-CM

## 2012-01-02 NOTE — Patient Instructions (Addendum)
Return to clinic as scheduled for continued upper and lower complete denture fabrication. Dr. Rayli Wiederhold 

## 2012-01-02 NOTE — Progress Notes (Addendum)
01/02/2012  Patient:            [name] Date of Birth:  05-28-1940 MRN:                [id number]  BP 139/83  Pulse 73  Temp 98.1 F (36.7 C)   [name] presents for start of upper and lower denture fabrication. Exam: Patient is edentulous. Patient has atrophy of the edentulous alveolar ridges.  Patient has significant gag reflex. Discussed procedures involved in upper and lower denture fabrication and the relatively poor prognosis for successful ability to wear dentures. We again discussed ideal use of implants to increase retention of lower denture. Patient refuses implant therapy at this time. Price for dentures confirmed.  Patient agrees to proceed with upper and lower denture fabrication. Procedure:  Upper and lower denture primary impressions in alginate. Difficult secondary to excessive gag reflex. Lab pour. To [facility] for upper and lower denture custom tray fabrication. Return to clinic for upper and lower denture final impressions.   Charlynne Pander, DDS

## 2012-01-15 ENCOUNTER — Other Ambulatory Visit: Payer: Self-pay

## 2012-01-15 ENCOUNTER — Inpatient Hospital Stay (HOSPITAL_COMMUNITY)
Admission: EM | Admit: 2012-01-15 | Discharge: 2012-01-19 | DRG: 690 | Disposition: A | Discharge status: Skilled Nursing Facility | Payer: PRIVATE HEALTH INSURANCE | Attending: Family Medicine | Admitting: Family Medicine

## 2012-01-15 ENCOUNTER — Encounter (HOSPITAL_COMMUNITY): Payer: Self-pay | Admitting: Emergency Medicine

## 2012-01-15 DIAGNOSIS — E1149 Type 2 diabetes mellitus with other diabetic neurological complication: Secondary | ICD-10-CM | POA: Diagnosis present

## 2012-01-15 DIAGNOSIS — I2510 Atherosclerotic heart disease of native coronary artery without angina pectoris: Secondary | ICD-10-CM

## 2012-01-15 DIAGNOSIS — F1011 Alcohol abuse, in remission: Secondary | ICD-10-CM

## 2012-01-15 DIAGNOSIS — I255 Ischemic cardiomyopathy: Secondary | ICD-10-CM

## 2012-01-15 DIAGNOSIS — G629 Polyneuropathy, unspecified: Secondary | ICD-10-CM

## 2012-01-15 DIAGNOSIS — K7689 Other specified diseases of liver: Secondary | ICD-10-CM | POA: Diagnosis present

## 2012-01-15 DIAGNOSIS — G4733 Obstructive sleep apnea (adult) (pediatric): Secondary | ICD-10-CM

## 2012-01-15 DIAGNOSIS — N400 Enlarged prostate without lower urinary tract symptoms: Secondary | ICD-10-CM

## 2012-01-15 DIAGNOSIS — Z7982 Long term (current) use of aspirin: Secondary | ICD-10-CM

## 2012-01-15 DIAGNOSIS — I10 Essential (primary) hypertension: Secondary | ICD-10-CM

## 2012-01-15 DIAGNOSIS — I252 Old myocardial infarction: Secondary | ICD-10-CM

## 2012-01-15 DIAGNOSIS — Z87891 Personal history of nicotine dependence: Secondary | ICD-10-CM

## 2012-01-15 DIAGNOSIS — I959 Hypotension, unspecified: Secondary | ICD-10-CM | POA: Diagnosis present

## 2012-01-15 DIAGNOSIS — E860 Dehydration: Secondary | ICD-10-CM

## 2012-01-15 DIAGNOSIS — Z9089 Acquired absence of other organs: Secondary | ICD-10-CM

## 2012-01-15 DIAGNOSIS — I2589 Other forms of chronic ischemic heart disease: Secondary | ICD-10-CM

## 2012-01-15 DIAGNOSIS — F17200 Nicotine dependence, unspecified, uncomplicated: Secondary | ICD-10-CM

## 2012-01-15 DIAGNOSIS — R609 Edema, unspecified: Secondary | ICD-10-CM

## 2012-01-15 DIAGNOSIS — I519 Heart disease, unspecified: Secondary | ICD-10-CM

## 2012-01-15 DIAGNOSIS — E785 Hyperlipidemia, unspecified: Secondary | ICD-10-CM

## 2012-01-15 DIAGNOSIS — G459 Transient cerebral ischemic attack, unspecified: Secondary | ICD-10-CM

## 2012-01-15 DIAGNOSIS — Z0000 Encounter for general adult medical examination without abnormal findings: Secondary | ICD-10-CM

## 2012-01-15 DIAGNOSIS — Z8249 Family history of ischemic heart disease and other diseases of the circulatory system: Secondary | ICD-10-CM

## 2012-01-15 DIAGNOSIS — I639 Cerebral infarction, unspecified: Secondary | ICD-10-CM

## 2012-01-15 DIAGNOSIS — Z79899 Other long term (current) drug therapy: Secondary | ICD-10-CM

## 2012-01-15 DIAGNOSIS — I2119 ST elevation (STEMI) myocardial infarction involving other coronary artery of inferior wall: Secondary | ICD-10-CM

## 2012-01-15 DIAGNOSIS — M109 Gout, unspecified: Secondary | ICD-10-CM

## 2012-01-15 DIAGNOSIS — K5100 Ulcerative (chronic) pancolitis without complications: Secondary | ICD-10-CM

## 2012-01-15 DIAGNOSIS — R32 Unspecified urinary incontinence: Secondary | ICD-10-CM

## 2012-01-15 DIAGNOSIS — E669 Obesity, unspecified: Secondary | ICD-10-CM

## 2012-01-15 DIAGNOSIS — N390 Urinary tract infection, site not specified: Secondary | ICD-10-CM

## 2012-01-15 DIAGNOSIS — I25810 Atherosclerosis of coronary artery bypass graft(s) without angina pectoris: Secondary | ICD-10-CM

## 2012-01-15 DIAGNOSIS — F319 Bipolar disorder, unspecified: Secondary | ICD-10-CM

## 2012-01-15 DIAGNOSIS — E876 Hypokalemia: Secondary | ICD-10-CM

## 2012-01-15 DIAGNOSIS — Z951 Presence of aortocoronary bypass graft: Secondary | ICD-10-CM

## 2012-01-15 DIAGNOSIS — A4901 Methicillin susceptible Staphylococcus aureus infection, unspecified site: Secondary | ICD-10-CM | POA: Diagnosis present

## 2012-01-15 DIAGNOSIS — R197 Diarrhea, unspecified: Secondary | ICD-10-CM

## 2012-01-15 DIAGNOSIS — Z888 Allergy status to other drugs, medicaments and biological substances status: Secondary | ICD-10-CM

## 2012-01-15 DIAGNOSIS — F3130 Bipolar disorder, current episode depressed, mild or moderate severity, unspecified: Secondary | ICD-10-CM | POA: Diagnosis present

## 2012-01-15 DIAGNOSIS — I498 Other specified cardiac arrhythmias: Secondary | ICD-10-CM

## 2012-01-15 DIAGNOSIS — E119 Type 2 diabetes mellitus without complications: Secondary | ICD-10-CM

## 2012-01-15 DIAGNOSIS — K5289 Other specified noninfective gastroenteritis and colitis: Secondary | ICD-10-CM | POA: Diagnosis present

## 2012-01-15 DIAGNOSIS — R4182 Altered mental status, unspecified: Secondary | ICD-10-CM | POA: Diagnosis present

## 2012-01-15 DIAGNOSIS — N12 Tubulo-interstitial nephritis, not specified as acute or chronic: Principal | ICD-10-CM

## 2012-01-15 DIAGNOSIS — Z66 Do not resuscitate: Secondary | ICD-10-CM

## 2012-01-15 DIAGNOSIS — E1142 Type 2 diabetes mellitus with diabetic polyneuropathy: Secondary | ICD-10-CM | POA: Diagnosis present

## 2012-01-15 DIAGNOSIS — Z885 Allergy status to narcotic agent status: Secondary | ICD-10-CM

## 2012-01-15 DIAGNOSIS — G47419 Narcolepsy without cataplexy: Secondary | ICD-10-CM

## 2012-01-15 DIAGNOSIS — Z6834 Body mass index (BMI) 34.0-34.9, adult: Secondary | ICD-10-CM

## 2012-01-15 DIAGNOSIS — Z794 Long term (current) use of insulin: Secondary | ICD-10-CM

## 2012-01-15 DIAGNOSIS — Z8673 Personal history of transient ischemic attack (TIA), and cerebral infarction without residual deficits: Secondary | ICD-10-CM

## 2012-01-15 DIAGNOSIS — G8929 Other chronic pain: Secondary | ICD-10-CM

## 2012-01-15 DIAGNOSIS — K529 Noninfective gastroenteritis and colitis, unspecified: Secondary | ICD-10-CM

## 2012-01-15 DIAGNOSIS — I5189 Other ill-defined heart diseases: Secondary | ICD-10-CM

## 2012-01-15 DIAGNOSIS — R3919 Other difficulties with micturition: Secondary | ICD-10-CM

## 2012-01-15 DIAGNOSIS — K5130 Ulcerative (chronic) rectosigmoiditis without complications: Secondary | ICD-10-CM

## 2012-01-15 LAB — COMPREHENSIVE METABOLIC PANEL
Alkaline Phosphatase: 38 U/L — ABNORMAL LOW (ref 39–117)
BUN: 29 mg/dL — ABNORMAL HIGH (ref 6–23)
GFR calc Af Amer: 83 mL/min — ABNORMAL LOW (ref >90)
Glucose, Bld: 279 mg/dL — ABNORMAL HIGH (ref 70–99)
Potassium: 3 mEq/L — ABNORMAL LOW (ref 3.5–5.1)
Total Bilirubin: 0.5 mg/dL (ref 0.3–1.2)
Total Protein: 6.8 g/dL (ref 6.0–8.3)

## 2012-01-15 LAB — CBC
HCT: 39.6 % (ref 39.0–52.0)
Hemoglobin: 13.8 g/dL (ref 13.0–17.0)
MCH: 31.7 pg (ref 26.0–34.0)
MCHC: 34.8 g/dL (ref 30.0–36.0)
MCV: 91 fL (ref 78.0–100.0)

## 2012-01-15 LAB — LACTIC ACID, PLASMA: Lactic Acid, Venous: 2.4 mmol/L — ABNORMAL HIGH (ref 0.5–2.2)

## 2012-01-15 LAB — URINALYSIS, ROUTINE W REFLEX MICROSCOPIC
Ketones, ur: NEGATIVE mg/dL
Nitrite: NEGATIVE
Specific Gravity, Urine: 1.026 (ref 1.005–1.030)
Urobilinogen, UA: 0.2 mg/dL (ref 0.0–1.0)

## 2012-01-15 LAB — LIPASE, BLOOD: Lipase: 21 U/L (ref 11–59)

## 2012-01-15 LAB — URINE MICROSCOPIC-ADD ON

## 2012-01-15 LAB — GLUCOSE, CAPILLARY: Glucose-Capillary: 259 mg/dL — ABNORMAL HIGH (ref 70–99)

## 2012-01-15 MED ORDER — INSULIN ASPART 100 UNIT/ML ~~LOC~~ SOLN
0.0000 [IU] | Freq: Three times a day (TID) | SUBCUTANEOUS | Status: DC
  Administered 2012-01-16: 2 [IU] via SUBCUTANEOUS
  Administered 2012-01-16: 3 [IU] via SUBCUTANEOUS

## 2012-01-15 MED ORDER — SIMVASTATIN 40 MG PO TABS
40.0000 mg | ORAL_TABLET | Freq: Every day | ORAL | Status: DC
  Administered 2012-01-15 – 2012-01-18 (×4): 40 mg via ORAL
  Filled 2012-01-15 (×5): qty 1

## 2012-01-15 MED ORDER — DARIFENACIN HYDROBROMIDE ER 7.5 MG PO TB24
7.5000 mg | ORAL_TABLET | Freq: Every day | ORAL | Status: DC
  Administered 2012-01-16 – 2012-01-19 (×4): 7.5 mg via ORAL
  Filled 2012-01-15 (×4): qty 1

## 2012-01-15 MED ORDER — ZOLPIDEM TARTRATE 5 MG PO TABS
5.0000 mg | ORAL_TABLET | Freq: Every evening | ORAL | Status: DC | PRN
  Administered 2012-01-17 – 2012-01-18 (×3): 5 mg via ORAL
  Filled 2012-01-15 (×3): qty 1

## 2012-01-15 MED ORDER — FENOFIBRATE 160 MG PO TABS
160.0000 mg | ORAL_TABLET | Freq: Every day | ORAL | Status: DC
  Administered 2012-01-16 – 2012-01-19 (×4): 160 mg via ORAL
  Filled 2012-01-15 (×4): qty 1

## 2012-01-15 MED ORDER — SODIUM CHLORIDE 0.9 % IV BOLUS (SEPSIS)
1000.0000 mL | Freq: Once | INTRAVENOUS | Status: AC
  Administered 2012-01-15: 1000 mL via INTRAVENOUS

## 2012-01-15 MED ORDER — ENOXAPARIN SODIUM 40 MG/0.4ML ~~LOC~~ SOLN
40.0000 mg | Freq: Every day | SUBCUTANEOUS | Status: DC
  Administered 2012-01-15 – 2012-01-17 (×3): 40 mg via SUBCUTANEOUS
  Filled 2012-01-15 (×4): qty 0.4

## 2012-01-15 MED ORDER — MORPHINE SULFATE 2 MG/ML IJ SOLN: 1.0000 mg | INTRAMUSCULAR | Status: DC | PRN

## 2012-01-15 MED ORDER — ACETAMINOPHEN 325 MG PO TABS
650.0000 mg | ORAL_TABLET | Freq: Four times a day (QID) | ORAL | Status: DC | PRN
  Administered 2012-01-19: 650 mg via ORAL
  Filled 2012-01-15: qty 2

## 2012-01-15 MED ORDER — TOPIRAMATE 100 MG PO TABS
200.0000 mg | ORAL_TABLET | Freq: Two times a day (BID) | ORAL | Status: DC
  Administered 2012-01-15 – 2012-01-19 (×8): 200 mg via ORAL
  Filled 2012-01-15 (×5): qty 2
  Filled 2012-01-15: qty 8
  Filled 2012-01-15: qty 4
  Filled 2012-01-15 (×3): qty 2

## 2012-01-15 MED ORDER — POTASSIUM CHLORIDE CRYS ER 20 MEQ PO TBCR
40.0000 meq | EXTENDED_RELEASE_TABLET | Freq: Once | ORAL | Status: AC
  Administered 2012-01-15: 40 meq via ORAL
  Filled 2012-01-15: qty 2

## 2012-01-15 MED ORDER — FINASTERIDE 5 MG PO TABS
5.0000 mg | ORAL_TABLET | Freq: Every morning | ORAL | Status: DC
  Administered 2012-01-16 – 2012-01-19 (×4): 5 mg via ORAL
  Filled 2012-01-15 (×4): qty 1

## 2012-01-15 MED ORDER — POTASSIUM CHLORIDE IN NACL 20-0.9 MEQ/L-% IV SOLN
INTRAVENOUS | Status: DC
  Administered 2012-01-15: 23:00:00 via INTRAVENOUS
  Administered 2012-01-16: 100 mL/h via INTRAVENOUS
  Administered 2012-01-17 – 2012-01-18 (×2): via INTRAVENOUS
  Filled 2012-01-15 (×8): qty 1000

## 2012-01-15 MED ORDER — PANTOPRAZOLE SODIUM 40 MG IV SOLR
40.0000 mg | Freq: Two times a day (BID) | INTRAVENOUS | Status: DC
  Administered 2012-01-15 – 2012-01-19 (×8): 40 mg via INTRAVENOUS
  Filled 2012-01-15 (×9): qty 40

## 2012-01-15 MED ORDER — HYDROCODONE-ACETAMINOPHEN 5-325 MG PO TABS
1.0000 | ORAL_TABLET | ORAL | Status: DC | PRN
  Administered 2012-01-18: 1 via ORAL
  Filled 2012-01-15: qty 1

## 2012-01-15 MED ORDER — ALLOPURINOL 300 MG PO TABS
300.0000 mg | ORAL_TABLET | Freq: Every day | ORAL | Status: DC
  Administered 2012-01-16 – 2012-01-19 (×4): 300 mg via ORAL
  Filled 2012-01-15 (×4): qty 1

## 2012-01-15 MED ORDER — ONDANSETRON HCL 4 MG PO TABS: 4.0000 mg | ORAL_TABLET | Freq: Four times a day (QID) | ORAL | Status: DC | PRN

## 2012-01-15 MED ORDER — ACETAMINOPHEN 650 MG RE SUPP: 650.0000 mg | Freq: Four times a day (QID) | RECTAL | Status: DC | PRN

## 2012-01-15 MED ORDER — SODIUM CHLORIDE 0.9 % IV BOLUS (SEPSIS)
500.0000 mL | Freq: Four times a day (QID) | INTRAVENOUS | Status: DC | PRN
  Administered 2012-01-15: 500 mL via INTRAVENOUS

## 2012-01-15 MED ORDER — CIPROFLOXACIN IN D5W 400 MG/200ML IV SOLN
400.0000 mg | Freq: Once | INTRAVENOUS | Status: AC
Start: 2012-01-15 — End: 2012-01-15
  Administered 2012-01-15: 400 mg via INTRAVENOUS
  Filled 2012-01-15: qty 200

## 2012-01-15 MED ORDER — TAMSULOSIN HCL 0.4 MG PO CAPS
0.4000 mg | ORAL_CAPSULE | Freq: Every day | ORAL | Status: DC
  Administered 2012-01-16 – 2012-01-18 (×3): 0.4 mg via ORAL
  Filled 2012-01-15 (×4): qty 1

## 2012-01-15 MED ORDER — METRONIDAZOLE IN NACL 5-0.79 MG/ML-% IV SOLN
500.0000 mg | Freq: Three times a day (TID) | INTRAVENOUS | Status: DC
  Administered 2012-01-15 – 2012-01-19 (×12): 500 mg via INTRAVENOUS
  Filled 2012-01-15 (×13): qty 100

## 2012-01-15 MED ORDER — ACETAMINOPHEN 325 MG PO TABS
650.0000 mg | ORAL_TABLET | Freq: Once | ORAL | Status: AC
  Administered 2012-01-15: 650 mg via ORAL
  Filled 2012-01-15: qty 2

## 2012-01-15 MED ORDER — DEXTROSE 5 % IV SOLN
1.0000 g | Freq: Once | INTRAVENOUS | Status: AC
  Administered 2012-01-15: 1 g via INTRAVENOUS
  Filled 2012-01-15: qty 10

## 2012-01-15 MED ORDER — ASPIRIN EC 81 MG PO TBEC
81.0000 mg | DELAYED_RELEASE_TABLET | Freq: Every day | ORAL | Status: DC
  Administered 2012-01-16 – 2012-01-17 (×2): 81 mg via ORAL
  Filled 2012-01-15 (×3): qty 1

## 2012-01-15 MED ORDER — ONDANSETRON HCL 4 MG/2ML IJ SOLN: 4.0000 mg | Freq: Four times a day (QID) | INTRAMUSCULAR | Status: DC | PRN

## 2012-01-15 NOTE — ED Notes (Addendum)
Pt from home.  Began having n/v/d last night.  Per wife pt threw up 3x last night, and has had constant diarrhea.  Pt took imodium like medication at 1pm today.  Pt denies having difficulty swallowing.  Pt also has fever of 101.7 F.  Per EMS pt has hx of dementia, but is able answer questions.

## 2012-01-15 NOTE — ED Notes (Signed)
Antonietta Jewel- wife- [phone] (h)   [phone]-

## 2012-01-15 NOTE — H&P (Addendum)
[facility]                                                                                    Patient Demographics  [name], is a 71 y.o. male  CSN: [id number]  MRN: [id number]  DOB - 1940-03-02  Admit Date - 01/15/2012  Outpatient Primary MD for the patient is Oliver Barre, MD   With History of -  Past Medical History  Diagnosis Date  . CAD (coronary artery disease)     s/p 4V CABG 10/15/08; EF is 50%  . Obesity   . BPH (benign prostatic hypertrophy)   . Edema   . Left ventricular systolic dysfunction     EF is 40% per echo December 2012  . AMI, INFERIOR WALL 09/30/2008    Qualifier: Diagnosis of  By: Sherral Hammers, RN, BSN, Melanie    . CAD, ARTERY BYPASS GRAFT 11/18/2008    Qualifier: Diagnosis of  By: Clifton James, MD, Cristal Deer    . 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 UNIVERSAL ULCERATIVE COLITIS 07/31/2007    Qualifier: Diagnosis of  By: Arlyce Dice MD, Barbette Hair   . Ischemic cardiomyopathy 05/05/2011  . DM 09/01/2008    Qualifier: Diagnosis of  By: Kem Parkinson    . OBSTRUCTIVE SLEEP APNEA 09/16/2008    Qualifier: Diagnosis of  By: Shelle Iron MD, Maree Krabbe   . HYPERTENSION 09/01/2008    Qualifier: Diagnosis of  By: Kem Parkinson    . Hyperlipidemia 05/18/2010  . Gout 09/23/2011  . Bipolar affective disorder 09/23/2011  . TIA (transient ischemic attack) 09/23/2011  . Chronic pain 09/23/2011  . DNR (do not resuscitate) 09/23/2011    Per July 2011 [place] admission  . CVA (cerebral infarction) 09/23/2011    Small left occipital  . 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 History of alcohol abuse 09/23/2011  . Blood in stool   . Depression   . Urine incontinence   . Stroke     "mini strokes"  . Incontinence of urine   .  Neuromuscular disorder     Diabetic neuropathy  . [place] spotted fever       Past Surgical History  Procedure Date  . Coronary artery bypass graft     Median sternotomy, extracorporeal circulation,  coronary artery bypass graft surgery x4 using a sequential left internal   mammary artery graft to the mid and distal left anterior descending, a   saphenous vein graft to diagonal branch of the left anterior descending,   and a saphenous vein graft to the obtuse marginal branch of left   circumflex coronary artery.    . Cardiac catheterization     Triple-vessel coronary artery disease. Low-normal left ventricular systolic function with mild   anteroapical wall motion abnormality.   . Tonsilectomy, adenoidectomy, bilateral myringotomy and tubes 1946  . Multiple extractions with alveoloplasty 12/01/2011    Procedure: MULTIPLE EXTRACION WITH ALVEOLOPLASTY;  Surgeon: Charlynne Pander, DDS;  Location: [facility] OR;  Service: Oral Surgery;  Laterality: N/A;  Extraction of tooth #'s 3,4,5,6,7,8,9,10,11,12,13,14,15,16,17,20,21,22,23,24,25,26,27,28,29,30,31,32 with alveoloplasty and bilateral mandibular lingual tori    in for   Chief Complaint  Patient presents with  . Emesis  . Diarrhea  . Fatigue     HPI  [name]  is a 71 y.o. male, with past medical history significant for coronary artery disease and diabetes mellitus presenting today with 2 days history of nausea vomiting and diarrhea. The stool  was described as yellow, and the patient had more than 8 episodes since yesterday, no nausea and vomiting were basically clear. The patient received antibiotics for a short period of time around 6 weeks ago for teeth extraction. Today the family noted altered mental status with fever.    Review of Systems    In addition to the HPI above, positive for Fever-chills, No Headache, No changes with Vision or hearing, No problems swallowing food or Liquids, No Chest pain, Cough or Shortness of  Breath, No Abdominal pain, positive for Nausea or Vommitting, and diarrhea No Blood in stool or Urine, No dysuria, No new skin rashes or bruises, No new joints pains-aches,  No new weakness, tingling, numbness in any extremity, No recent weight gain or loss, No polyuria, polydypsia or polyphagia, No significant Mental Stressors.  A full 10 point Review of Systems was done, except as stated above, all other Review of Systems were negative.   Social History History  Substance Use Topics  . Smoking status: Former Smoker    Quit date: 01/18/2008  . Smokeless tobacco: Never Used  . Alcohol Use: No     Family History Family History  Problem Relation Age of Onset  . Diabetes Father   . Heart disease Father   . Heart attack Mother   . Aortic aneurysm Mother   . Alcohol abuse Other   . Arthritis Other   . Hyperlipidemia Other   . Heart disease Other   . Stroke Other   . Hypertension Other   . Diabetes Other   . Mental illness Other   . Alcohol abuse Other   . Arthritis Other   . Heart disease Other   . Mental illness Other   . Diabetes Other      Prior to Admission medications   Medication Sig Start Date End Date Taking? Authorizing Provider  allopurinol (ZYLOPRIM) 300 MG tablet Take 1 tablet (300 mg total) by mouth daily. 12/12/11  Yes Corwin Levins, MD  aspirin 81 MG tablet Take 81 mg by mouth daily.     Yes Historical Provider, MD  carvedilol (COREG) 6.25 MG tablet Take 6.25 mg by mouth 2 (two) times daily with a meal.   Yes Historical Provider, MD  citalopram (CELEXA) 20 MG tablet Take 20 mg by mouth every morning. 09/28/11  Yes Corwin Levins, MD  fenofibrate 160 MG tablet Take 1 tablet (160 mg total) by mouth daily. 09/30/11 09/29/12 Yes Corwin Levins, MD  finasteride (PROSCAR) 5 MG tablet Take 5 mg by mouth every morning. 09/28/11  Yes Corwin Levins, MD  glimepiride (AMARYL) 4 MG tablet Take 1 tablet (4 mg total) by mouth daily before breakfast. 09/28/11  Yes Corwin Levins,  MD  HYDROcodone-acetaminophen (LORTAB) 10-500 MG per tablet Take 1 tablet by mouth every 6 (six) hours as needed. pain 12/05/11  Yes Corwin Levins, MD  insulin glargine (LANTUS) 100 UNIT/ML injection Inject 50-60 Units into the skin 2 (two) times daily. Takes  60 units in the morning and 50 units at night   Yes Historical Provider, MD  simvastatin (ZOCOR) 40 MG tablet Take 40 mg by mouth at bedtime.     Yes Historical Provider, MD  solifenacin (VESICARE) 10 MG tablet Take 10 mg by mouth daily.   Yes Historical Provider, MD  Tamsulosin HCl (FLOMAX) 0.4 MG CAPS Take 0.4 mg by mouth daily after supper.    Yes Historical Provider, MD  topiramate (TOPAMAX) 200 MG tablet Take 200 mg by mouth 2 (two) times daily.     Yes Historical Provider, MD    Allergies  Allergen Reactions  . Dexedrine (Dextroamphetamine Sulfate Er)     agitation  . Oxycodone     nausea    Physical Exam  Vitals  Blood pressure 118/63, pulse 92, temperature 98.4 F (36.9 C), temperature source Oral, resp. rate 23, SpO2 93.00%.   1. General elderly white gentleman very pleasant looks tired.  2. Normal affect and insight, Not Suicidal or Homicidal, Awake Alert, Oriented X 3.  3. No F.N deficits, ALL C.Nerves Intact, Strength 5/5 all 4 extremities, Sensation intact all 4 extremities, Plantars down going.  4. Ears and Eyes appear Normal, Conjunctivae clear, PERRLA. Moist Oral Mucosa, geographic tongue.  5. Supple Neck, No JVD, No cervical lymphadenopathy appriciated, No Carotid Bruits.  6. Symmetrical Chest wall movement, Good air movement bilaterally, CTAB.  7. RRR, No Gallops, Rubs or Murmurs, No Parasternal Heave.  8. Positive Bowel Sounds, Abdomen Soft, Non tender, No organomegaly appriciated,No rebound -guarding or rigidity.  9.  No Cyanosis, Normal Skin Turgor, No Skin Rash or Bruise.  10. Good muscle tone,  joints appear normal , no effusions, Normal ROM.  11. No Palpable Lymph Nodes in Neck or  Axillae    Data Review  CBC  Lab 01/15/12 1636  WBC 10.4  HGB 13.8  HCT 39.6  PLT 201  MCV 91.0  MCH 31.7  MCHC 34.8  RDW 13.7  LYMPHSABS --  MONOABS --  EOSABS --  BASOSABS --  BANDABS --   ------------------------------------------------------------------------------------------------------------------  Chemistries   Lab 01/15/12 1636  NA 137  K 3.0*  CL 105  CO2 17*  GLUCOSE 279*  BUN 29*  CREATININE 1.01  CALCIUM 8.4  MG --  AST 19  ALT 25  ALKPHOS 38*  BILITOT 0.5   ------------------------------------------------------------------------------------------------------------------ -------------------------------------------------------------------------------------------------------------------    ---------------------------------------------------------------------------------------------------------------  Urinalysis    Component Value Date/Time   COLORURINE YELLOW 01/15/2012 1846   APPEARANCEUR TURBID* 01/15/2012 1846   LABSPEC 1.026 01/15/2012 1846   PHURINE 5.0 01/15/2012 1846   GLUCOSEU 500* 01/15/2012 1846   HGBUR MODERATE* 01/15/2012 1846   BILIRUBINUR SMALL* 01/15/2012 1846   KETONESUR NEGATIVE 01/15/2012 1846   PROTEINUR 30* 01/15/2012 1846   UROBILINOGEN 0.2 01/15/2012 1846   NITRITE NEGATIVE 01/15/2012 1846   LEUKOCYTESUR LARGE* 01/15/2012 1846    ----------------------------------------------------------------------------------------------------------------  ABG     Imaging results:   No results found.      Assessment & Plan  1. Pyelonephritis 2. Nausea vomiting diarrhea with dehydration, C. difficile is a consideration. 3. Diabetes mellitus with hyperglycemia 4. History of coronary artery disease stable with history of ischemic cardiomyopathy ejection fraction 40% reported from this admission 5. History of hypertension among the patient was hypotensive in the emergency room 6. History of CVA 7. History of  obstructive sleep apnea 8. History of BPH on Flomax, Foley placed in the emergency room  Plan  IV fluids IV Cipro/Flagyl Urine and stool cultures  with stool for C. Difficile Insulin sliding scale   DVT Prophylaxis Lovenox  AM Labs Ordered, also please review Full Orders  Family Communication: Admission, patients condition and plan of care including tests being ordered have been discussed with the patient and wife who indicate understanding and agree with the plan and Code Status.  Code Status full  Disposition Plan: Admit to MedSurg and discharged home in stable  Time spent in minutes : 35  Condition fair   Please follow patient clinically because of history of congestive heart failure. Will change his admission to telemetry.

## 2012-01-15 NOTE — ED Notes (Signed)
AVW:[id number]<XB> Expected date:01/15/12<BR> Expected time: 2:59 PM<BR> Means of arrival:Ambulance<BR> Comments:<BR> Diarrhea

## 2012-01-15 NOTE — ED Provider Notes (Signed)
History     CSN: [id number]  Arrival date & time 01/15/12  1505   First MD Initiated Contact with Patient 01/15/12 1522      Chief Complaint  Patient presents with  . Emesis  . Diarrhea  . Fatigue    (Consider location/radiation/quality/duration/timing/severity/associated sxs/prior treatment) HPI Pt presents with c/o nausea, vomiting and diarrhea.  Per wife he had 3 episodes of emesis last night- nonbloody/nonbiliuous, starting last night has had copious watery diarrhea.  No abdominal pain.  Fever noted via EMS.  Has felt very weak and light headed.  Was on antibiotics recently approx 1 month ago after having teeth pulled- he is unsure which antibiotics.  No recent hospitalizations.  Denies chest pain or shortness of breath.  Pt has dementia but per family he is at his baseline- but weaker than usual.  There are no other associated systemic symptoms, there are no other alleviating or modifying factors.   Past Medical History  Diagnosis Date  . CAD (coronary artery disease)     s/p 4V CABG 10/15/08; EF is 50%  . Obesity   . BPH (benign prostatic hypertrophy)   . Edema   . Left ventricular systolic dysfunction     EF is 40% per echo December 2012  . AMI, INFERIOR WALL 09/30/2008    Qualifier: Diagnosis of  By: Sherral Hammers, RN, BSN, Melanie    . CAD, ARTERY BYPASS GRAFT 11/18/2008    Qualifier: Diagnosis of  By: Clifton James, MD, Cristal Deer    . 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 UNIVERSAL ULCERATIVE COLITIS 07/31/2007    Qualifier: Diagnosis of  By: Arlyce Dice MD, Barbette Hair   . Ischemic cardiomyopathy 05/05/2011  . DM 09/01/2008    Qualifier: Diagnosis of  By: Kem Parkinson    . OBSTRUCTIVE SLEEP APNEA 09/16/2008    Qualifier: Diagnosis  of  By: Shelle Iron MD, Maree Krabbe   . HYPERTENSION 09/01/2008    Qualifier: Diagnosis of  By: Kem Parkinson    . Hyperlipidemia 05/18/2010  . Gout 09/23/2011  . Bipolar affective disorder 09/23/2011  . TIA (transient ischemic attack) 09/23/2011  . Chronic pain 09/23/2011  . DNR (do not resuscitate) 09/23/2011    Per July 2011 [place] admission  . CVA (cerebral infarction) 09/23/2011    Small left occipital  . 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 History of alcohol abuse 09/23/2011  . Blood in stool   . Depression   . Urine incontinence   . Stroke     "mini strokes"  . Incontinence of urine   . Neuromuscular disorder     Diabetic neuropathy  . [place] spotted fever     Past Surgical History  Procedure Date  . Coronary artery bypass graft     Median sternotomy, extracorporeal circulation,  coronary artery bypass graft surgery x4 using a sequential left internal   mammary artery graft to the mid and distal left anterior descending, a   saphenous vein graft to diagonal branch of the left anterior descending,   and a saphenous vein graft to the obtuse marginal branch of left   circumflex coronary artery.    . Cardiac catheterization     Triple-vessel coronary artery disease. Low-normal left ventricular systolic function with mild   anteroapical wall motion abnormality.   [name] Kitchen  Tonsilectomy, adenoidectomy, bilateral myringotomy and tubes 1946  . Multiple extractions with alveoloplasty 12/01/2011    Procedure: MULTIPLE EXTRACION WITH ALVEOLOPLASTY;  Surgeon: Charlynne Pander, DDS;  Location: [facility] OR;  Service: Oral Surgery;  Laterality: N/A;  Extraction of tooth #'s 3,4,5,6,7,8,9,10,11,12,13,14,15,16,17,20,21,22,23,24,25,26,27,28,29,30,31,32 with alveoloplasty and bilateral mandibular lingual tori    Family History  Problem Relation Age of Onset  . Diabetes Father   . Heart disease Father   . Heart attack Mother   . Aortic aneurysm Mother   . Alcohol abuse Other   . Arthritis Other   . Hyperlipidemia Other     . Heart disease Other   . Stroke Other   . Hypertension Other   . Diabetes Other   . Mental illness Other   . Alcohol abuse Other   . Arthritis Other   . Heart disease Other   . Mental illness Other   . Diabetes Other     History  Substance Use Topics  . Smoking status: Former Smoker    Quit date: 01/18/2008  . Smokeless tobacco: Never Used  . Alcohol Use: No      Review of Systems ROS reviewed and all otherwise negative except for mentioned in HPI  Allergies  Dexedrine and Oxycodone  Home Medications   Current Outpatient Rx  Name  Route  Sig  Dispense  Refill  . ALLOPURINOL 300 MG PO TABS   Oral   Take 1 tablet (300 mg total) by mouth daily.   30 tablet   11   . ASPIRIN 81 MG PO TABS   Oral   Take 81 mg by mouth daily.           [name] Kitchen CARVEDILOL 6.25 MG PO TABS   Oral   Take 6.25 mg by mouth 2 (two) times daily with a meal.         . CITALOPRAM HYDROBROMIDE 20 MG PO TABS   Oral   Take 20 mg by mouth every morning.         . FENOFIBRATE 160 MG PO TABS   Oral   Take 1 tablet (160 mg total) by mouth daily.   90 tablet   3   . FINASTERIDE 5 MG PO TABS   Oral   Take 5 mg by mouth every morning.         [name] Kitchen GLIMEPIRIDE 4 MG PO TABS   Oral   Take 1 tablet (4 mg total) by mouth daily before breakfast.   90 tablet   3   . HYDROCODONE-ACETAMINOPHEN 10-500 MG PO TABS   Oral   Take 1 tablet by mouth every 6 (six) hours as needed. pain         . INSULIN GLARGINE 100 UNIT/ML [place] SOLN   Subcutaneous   Inject 50-60 Units into the skin 2 (two) times daily. Takes 60 units in the morning and 50 units at night         . SIMVASTATIN 40 MG PO TABS   Oral   Take 40 mg by mouth at bedtime.           . SOLIFENACIN SUCCINATE 10 MG PO TABS   Oral   Take 10 mg by mouth daily.         [name] Kitchen TAMSULOSIN HCL 0.4 MG PO CAPS   Oral   Take 0.4 mg by mouth daily after supper.          . TOPIRAMATE 200 MG PO TABS   Oral  Take 200 mg by mouth 2 (two) times  daily.             BP 118/63  Pulse 92  Temp 98.4 F (36.9 C) (Oral)  Resp 23  SpO2 93% Vitals reviewed Physical Exam Physical Examination: General appearance - alert, ill appearing, and in no distress Mental status - alert, oriented to person, place, and time Eyes - pupils equal and reactive, no conjunctival injection, no scleral icterus Mouth - mucous membranes dry, OP without lesions Chest - clear to auscultation, no wheezes, rales or rhonchi, symmetric air entry Heart - normal rate, regular rhythm, normal S1, S2, no murmurs, rubs, clicks or gallops Abdomen - soft, nontender, nondistended, no masses or organomegaly, nabs Extremities - peripheral pulses normal, no pedal edema, no clubbing or cyanosis Skin - normal coloration and turgor, no rashes, cap refill approx 3 seconds  ED Course  Procedures (including critical care time)   Date: 01/15/2012  Rate: 112  Rhythm: sinus tachycardia  QRS Axis: left  Intervals: IVCD  ST/T Wave abnormalities: nonspecific ST/T changes  Conduction Disutrbances:nonspecific intraventricular conduction delay  Narrative Interpretation:   Old EKG Reviewed: morphology without significant changes since 03/17/09, but rate has increased  8:35 PM  D/w [facility] for admission, he will see patient in the ED.  Also requests cipro instead of rocephin due to crosscover of possible GI source.  cdif negative.      Labs Reviewed  COMPREHENSIVE METABOLIC PANEL - Abnormal; Notable for the following:    Potassium 3.0 (*)     CO2 17 (*)     Glucose, Bld 279 (*)     BUN 29 (*)     Albumin 3.2 (*)     Alkaline Phosphatase 38 (*)     GFR calc non Af Amer 72 (*)     GFR calc Af Amer 83 (*)     All other components within normal limits  URINALYSIS, ROUTINE W REFLEX MICROSCOPIC - Abnormal; Notable for the following:    APPearance TURBID (*)     Glucose, UA 500 (*)     Hgb urine dipstick MODERATE (*)     Bilirubin Urine SMALL (*)     Protein, ur 30 (*)      Leukocytes, UA LARGE (*)     All other components within normal limits  LACTIC ACID, PLASMA - Abnormal; Notable for the following:    Lactic Acid, Venous 2.4 (*)     All other components within normal limits  URINE MICROSCOPIC-ADD ON - Abnormal; Notable for the following:    Squamous Epithelial / LPF FEW (*)     Bacteria, UA FEW (*)     All other components within normal limits  GLUCOSE, CAPILLARY - Abnormal; Notable for the following:    Glucose-Capillary 259 (*)     All other components within normal limits  CBC  LIPASE, BLOOD  CLOSTRIDIUM DIFFICILE BY PCR  STOOL CULTURE  CULTURE, BLOOD (ROUTINE X 2)  CULTURE, BLOOD (ROUTINE X 2)  URINE CULTURE  CBC  CREATININE, SERUM  CLOSTRIDIUM DIFFICILE CULTURE-FECAL   No results found.   1. Gastroenteritis   2. UTI (lower urinary tract infection)   3. Hypokalemia   4. Dehydration   5. BPH (benign prostatic hypertrophy)   6. Pyelonephritis       MDM  Pt presenting with c/o vomiting and diarrhea associated with generalized fatigue and weakness.  No abdominal pain.  Pt appear dehydrated on exam, treated with IV hydration and antiemetics.  He has mild hypokalemia, and is found to have UTI.  Pt started on antibiotics.  Pt admitted to [facility] for further management.          Ethelda Chick, MD 01/15/12 [phone]

## 2012-01-15 NOTE — ED Notes (Addendum)
MD at bedside. Family at bedside with MD

## 2012-01-16 ENCOUNTER — Encounter (HOSPITAL_COMMUNITY): Payer: Self-pay | Admitting: *Deleted

## 2012-01-16 DIAGNOSIS — N400 Enlarged prostate without lower urinary tract symptoms: Secondary | ICD-10-CM

## 2012-01-16 LAB — BASIC METABOLIC PANEL
BUN: 18 mg/dL (ref 6–23)
Calcium: 8.1 mg/dL — ABNORMAL LOW (ref 8.4–10.5)
Creatinine, Ser: 0.94 mg/dL (ref 0.50–1.35)
GFR calc Af Amer: >90 mL/min (ref >90)
GFR calc non Af Amer: 81 mL/min — ABNORMAL LOW (ref >90)
Glucose, Bld: 209 mg/dL — ABNORMAL HIGH (ref 70–99)
Potassium: 3.4 mEq/L — ABNORMAL LOW (ref 3.5–5.1)

## 2012-01-16 LAB — GLUCOSE, CAPILLARY
Glucose-Capillary: 185 mg/dL — ABNORMAL HIGH (ref 70–99)
Glucose-Capillary: 189 mg/dL — ABNORMAL HIGH (ref 70–99)

## 2012-01-16 LAB — HEMOGLOBIN A1C: Hgb A1c MFr Bld: 7.9 % — ABNORMAL HIGH (ref <5.7)

## 2012-01-16 LAB — OCCULT BLOOD X 1 CARD TO LAB, STOOL: Fecal Occult Bld: POSITIVE — AB

## 2012-01-16 MED ORDER — INSULIN ASPART 100 UNIT/ML ~~LOC~~ SOLN
0.0000 [IU] | Freq: Every day | SUBCUTANEOUS | Status: DC
  Administered 2012-01-18: 3 [IU] via SUBCUTANEOUS

## 2012-01-16 MED ORDER — CIPROFLOXACIN IN D5W 400 MG/200ML IV SOLN
400.0000 mg | Freq: Two times a day (BID) | INTRAVENOUS | Status: DC
  Administered 2012-01-16 – 2012-01-19 (×7): 400 mg via INTRAVENOUS
  Filled 2012-01-16 (×7): qty 200

## 2012-01-16 MED ORDER — POTASSIUM CHLORIDE CRYS ER 20 MEQ PO TBCR
40.0000 meq | EXTENDED_RELEASE_TABLET | Freq: Two times a day (BID) | ORAL | Status: AC
  Administered 2012-01-16 (×2): 40 meq via ORAL
  Filled 2012-01-16 (×2): qty 2

## 2012-01-16 MED ORDER — INSULIN ASPART 100 UNIT/ML ~~LOC~~ SOLN
0.0000 [IU] | Freq: Three times a day (TID) | SUBCUTANEOUS | Status: DC
  Administered 2012-01-16 – 2012-01-17 (×2): 3 [IU] via SUBCUTANEOUS
  Administered 2012-01-17: 5 [IU] via SUBCUTANEOUS
  Administered 2012-01-17 – 2012-01-18 (×2): 3 [IU] via SUBCUTANEOUS
  Administered 2012-01-18 – 2012-01-19 (×4): 5 [IU] via SUBCUTANEOUS

## 2012-01-16 MED ORDER — MAGNESIUM SULFATE 40 MG/ML IJ SOLN
2.0000 g | Freq: Once | INTRAMUSCULAR | Status: AC
  Administered 2012-01-16: 2 g via INTRAVENOUS
  Filled 2012-01-16: qty 50

## 2012-01-16 NOTE — Evaluation (Signed)
Physical Therapy Evaluation Patient Details Name: [name] MRN: [id number] DOB: [birth year] Today's Date: 01/16/2012 Time: 4098-1191    PT Assessment / Plan / Recommendation Clinical Impression  pt adm with N/V/D; now with generalized weakness,  fatigues quickly with mobility and  will benefit from PT to maximize independence for return home with family support    PT Assessment  Patient needs continued PT services    Follow Up Recommendations  Home health PT    Does the patient have the potential to tolerate intense rehabilitation      Barriers to Discharge None      Equipment Recommendations  None recommended by PT    Recommendations for Other Services     Frequency Min 3X/week    Precautions / Restrictions Precautions Precautions: Other (comment) (stool incontinence/urgency) Restrictions Weight Bearing Restrictions: No   Pertinent Vitals/Pain Denies pain      Mobility  Bed Mobility Bed Mobility: Not assessed Details for Bed Mobility Assistance: getting up with nsg upon PT arival; stool on floor, pt trying to get to BR Transfers Transfers: Sit to Stand;Stand to Sit Sit to Stand: 4: Min guard;From toilet Stand to Sit: 4: Min guard;To chair/3-in-1 Details for Transfer Assistance: cues for hand placement Ambulation/Gait Ambulation/Gait Assistance: 4: Min guard Ambulation Distance (Feet): 120 Feet Assistive device: Rolling walker Ambulation/Gait Assistance Details: cues for RW safety; pt fatigued with amb distance, min DOE Gait Pattern: Step-to pattern;Step-through pattern;Trunk flexed    Shoulder Instructions     Exercises     PT Diagnosis: Difficulty walking;Generalized weakness  PT Problem List: Decreased activity tolerance;Decreased balance;Decreased mobility PT Treatment Interventions: DME instruction;Gait training;Stair training;Functional mobility training;Therapeutic activities;Therapeutic exercise;Patient/family education   PT Goals Acute  Rehab PT Goals PT Goal Formulation: With patient Time For Goal Achievement: 01/30/12 Potential to Achieve Goals: Good Pt will go Supine/Side to Sit: with modified independence PT Goal: Supine/Side to Sit - Progress: Goal set today Pt will go Sit to Stand: with modified independence PT Goal: Sit to Stand - Progress: Goal set today Pt will go Stand to Sit: with modified independence PT Goal: Stand to Sit - Progress: Goal set today Pt will Ambulate: with supervision;51 - 150 feet;with least restrictive assistive device PT Goal: Ambulate - Progress: Goal set today Pt will Go Up / Down Stairs: 3-5 stairs;with min assist;with least restrictive assistive device PT Goal: Up/Down Stairs - Progress: Goal set today  Visit Information  Last PT Received On: 01/16/12 Assistance Needed: +1    Subjective Data  Subjective: this is not contagious Patient Stated Goal: home, does not want to go to SNF   Prior Functioning  Home Living Lives With: Spouse;Daughter Available Help at Discharge: Family;Available PRN/intermittently Type of Home: House Home Access: Stairs to enter Entrance Stairs-Number of Steps: 3 Home Layout: One level;Laundry or work area in [employer] Equipment: Straight cane;Walker - rolling;Bedside commode/3-in-1 Additional Comments: uses cane occasionally Prior Function Level of Independence: Independent;Independent with assistive device(s) Able to Take Stairs?: Yes Vocation: Retired Musician: No difficulties    Cognition  Overall Cognitive Status: Appears within functional limits for tasks assessed/performed Arousal/Alertness: Awake/alert Orientation Level: Appears intact for tasks assessed Behavior During Session: [facility] for tasks performed    Extremity/Trunk Assessment Right Upper Extremity Assessment RUE ROM/Strength/Tone: [facility] for tasks assessed Left Upper Extremity Assessment LUE ROM/Strength/Tone: [facility] for tasks assessed Right Lower  Extremity Assessment RLE ROM/Strength/Tone: [facility] for tasks assessed Left Lower Extremity Assessment LLE ROM/Strength/Tone: [facility] for tasks assessed   Balance Static  Standing Balance Static Standing - Balance Support: During functional activity;Left upper extremity supported Static Standing - Level of Assistance: 5: Stand by assistance  End of Session PT - End of Session Equipment Utilized During Treatment: Gait belt Activity Tolerance: Patient tolerated treatment well Patient left: in chair;with call bell/phone within reach Nurse Communication: Mobility status  GP     [facility] 01/16/2012, 1:01 PM

## 2012-01-16 NOTE — Progress Notes (Signed)
Clinical Social Work Department BRIEF PSYCHOSOCIAL ASSESSMENT 01/16/2012  Patient:  [name], [name]     Account Number:  [id number]     Admit date:  01/15/2012  Clinical Social Worker:  Orpah Greek  Date/Time:  01/16/2012 11:20 AM  Referred by:  Physician  Date Referred:  01/16/2012 Referred for  SNF Placement   Other Referral:   Interview type:  Family Other interview type:   patient's wife    PSYCHOSOCIAL DATA Living Status:  WIFE Admitted from facility:   Level of care:   Primary support name:  [name] (wife) h#: [phone] c#: [id number] Primary support relationship to patient:  SPOUSE Degree of support available:   good    CURRENT CONCERNS Current Concerns  Post-Acute Placement   Other Concerns:    SOCIAL WORK ASSESSMENT / PLAN CSW received referral from RN, that patient's wife states that she feels she can no longer care for him at home. CSW spoke with patient's wife and agrees with plan for SNF.   Assessment/plan status:  Information/Referral to [facility] Other assessment/ plan:   Information/referral to community resources:   CSW completed FL2 and faxed information out to [facility] - will provide bed offers when available.    PATIENT'S/FAMILY'S RESPONSE TO PLAN OF CARE: Patient's wife expressed interest in [facility]. CSW encouraged wife to call or tour facility.        Unice Bailey, LCSW [facility] Clinical Social Worker cell #: [phone]

## 2012-01-16 NOTE — Progress Notes (Signed)
Clinical Social Work Department CLINICAL SOCIAL WORK PLACEMENT NOTE 01/16/2012  Patient:  [name], [name]  Account Number:  [id number] Admit date:  01/15/2012  Clinical Social Worker:  Orpah Greek  Date/time:  01/16/2012 11:23 AM  Clinical Social Work is seeking post-discharge placement for this patient at the following level of care:   SKILLED NURSING   (*CSW will update this form in Epic as items are completed)   01/16/2012  Patient/family provided with [facility] Department of Clinical Social Work's list of facilities offering this level of care within the geographic area requested by the patient (or if unable, by the patient's family).  01/16/2012  Patient/family informed of their freedom to choose among providers that offer the needed level of care, that participate in Medicare, Medicaid or managed care program needed by the patient, have an available bed and are willing to accept the patient.  01/16/2012  Patient/family informed of [facility]' ownership interest in [facility], as well as of the fact that they are under no obligation to receive care at this facility.  PASARR submitted to EDS on 01/16/2012 PASARR number received from EDS on 01/16/2012  FL2 transmitted to all facilities in geographic area requested by pt/family on  01/16/2012 FL2 transmitted to all facilities within larger geographic area on   Patient informed that his/her managed care company has contracts with or will negotiate with  certain facilities, including the following:     Patient/family informed of bed offers received:   Patient chooses bed at  Physician recommends and patient chooses bed at    Patient to be transferred to  on   Patient to be transferred to facility by   The following physician request were entered in Epic:   Additional Comments:  Unice Bailey, LCSW [facility] Clinical Social Worker cell #: [phone]

## 2012-01-16 NOTE — Progress Notes (Signed)
[facility] PROGRESS NOTE  [name] [name] UXL:[id number] DOB: 1940-04-05 DOA: 01/15/2012 PCP: Oliver Barre, MD  Assessment/Plan: 1. Pyelonephritis : on IV  Cipro. Cultures are pending.  2. Nausea vomiting diarrhea with dehydration, C. difficile pcr is negative.  Probably gastroenteritis. 3. Diabetes mellitus with hyperglycemia : on SSI 4. History of coronary artery disease stable with history of ischemic cardiomyopathy ejection fraction 40% reported from this admission : GENTLE Hydration.  5. History of hypertension among the patient was hypotensive in the emergency room  6. History of CVA  7. History of obstructive sleep apnea  8. History of BPH on Flomax, Foley placed in the emergency room   Code Status: full code Family Communication: wife at bedside and very anxious Disposition Plan: possibly in 1 to 2 days.    Consultants: None.   Antibiotics:  cipro 12/29  Flagyl 12/29  Rocephin one dose 12/29  HPI/Subjective: Comfortable. Wants to go home as soon as possible.   Objective: Filed Vitals:   01/15/12 1953 01/15/12 2322 01/16/12 0009 01/16/12 0406  BP: 118/63 138/72 106/59 116/85  Pulse: 92 82 81 70  Temp: 98.4 F (36.9 C)  97.7 F (36.5 C) 99.1 F (37.3 C)  TempSrc: Oral  Oral Oral  Resp: 23 17  18   Height:   5\' 8"  (1.727 m)   Weight:   104 kg (229 lb 4.5 oz)   SpO2: 93% 94% 93% 94%    Intake/Output Summary (Last 24 hours) at 01/16/12 1356 Last data filed at 01/16/12 0646  Gross per 24 hour  Intake    990 ml  Output    101 ml  Net    889 ml   Filed Weights   01/16/12 0009  Weight: 104 kg (229 lb 4.5 oz)    Exam:   General:  Alert afebrile comfortable  Cardiovascular: s1s2 RRR  Respiratory: CTAB, no wheezing or rhonchi.  Abdomen: soft NT ND BS+  Data Reviewed: Basic Metabolic Panel:  Lab 01/16/12 5366 01/15/12 1636  NA 140 137  K 3.4* 3.0*  CL 110 105  CO2 19 17*  GLUCOSE 209* 279*  BUN 18 29*  CREATININE 0.94 1.01  CALCIUM  8.1* 8.4  MG 1.3* --  PHOS -- --   Liver Function Tests:  Lab 01/15/12 1636  AST 19  ALT 25  ALKPHOS 38*  BILITOT 0.5  PROT 6.8  ALBUMIN 3.2*    Lab 01/15/12 1636  LIPASE 21  AMYLASE --   No results found for this basename: AMMONIA:5 in the last 168 hours CBC:  Lab 01/15/12 1636  WBC 10.4  NEUTROABS --  HGB 13.8  HCT 39.6  MCV 91.0  PLT 201   Cardiac Enzymes: No results found for this basename: CKTOTAL:5,CKMB:5,CKMBINDEX:5,TROPONINI:5 in the last 168 hours BNP (last 3 results) No results found for this basename: PROBNP:3 in the last 8760 hours CBG:  Lab 01/15/12 2024  GLUCAP 259*    Recent Results (from the past 240 hour(s))  CULTURE, BLOOD (ROUTINE X 2)     Status: Normal (Preliminary result)   Collection Time   01/15/12  4:40 PM      Component Value Range Status Comment   Specimen Description BLOOD RIGHT ARM   Final    Special Requests BOTTLES DRAWN AEROBIC AND ANAEROBIC 5CC   Final    Culture  Setup Time 01/15/2012 23:00   Final    Culture     Final    Value:  BLOOD CULTURE RECEIVED NO GROWTH TO DATE CULTURE WILL BE HELD FOR 5 DAYS BEFORE ISSUING A FINAL NEGATIVE REPORT   Report Status PENDING   Incomplete   CULTURE, BLOOD (ROUTINE X 2)     Status: Normal (Preliminary result)   Collection Time   01/15/12  4:50 PM      Component Value Range Status Comment   Specimen Description BLOOD RIGHT ARM   Final    Special Requests BOTTLES DRAWN AEROBIC AND ANAEROBIC 9CC   Final    Culture  Setup Time 01/15/2012 23:00   Final    Culture     Final    Value:        BLOOD CULTURE RECEIVED NO GROWTH TO DATE CULTURE WILL BE HELD FOR 5 DAYS BEFORE ISSUING A FINAL NEGATIVE REPORT   Report Status PENDING   Incomplete   CLOSTRIDIUM DIFFICILE BY PCR     Status: Normal   Collection Time   01/15/12  5:29 PM      Component Value Range Status Comment   C difficile by pcr NEGATIVE  NEGATIVE Final      Studies: No results found.  Scheduled Meds:   . allopurinol   300 mg Oral Daily  . aspirin EC  81 mg Oral Daily  . ciprofloxacin  400 mg Intravenous Q12H  . darifenacin  7.5 mg Oral Daily  . enoxaparin (LOVENOX) injection  40 mg Subcutaneous QHS  . fenofibrate  160 mg Oral Daily  . finasteride  5 mg Oral q morning - 10a  . insulin aspart  0-15 Units Subcutaneous TID WC  . insulin aspart  0-5 Units Subcutaneous QHS  . magnesium sulfate 1 - 4 g bolus IVPB  2 g Intravenous Once  . metronidazole  500 mg Intravenous Q8H  . pantoprazole (PROTONIX) IV  40 mg Intravenous Q12H  . potassium chloride  40 mEq Oral BID  . simvastatin  40 mg Oral QHS  . Tamsulosin HCl  0.4 mg Oral QPC supper  . topiramate  200 mg Oral BID   Continuous Infusions:   . 0.9 % NaCl with KCl 20 mEq / L 100 mL/hr (01/16/12 1331)    Principal Problem:  *Pyelonephritis Active Problems:  Gastroenteritis        [name]  [facility] Pager [phone] If 8PM-8AM, please contact night-coverage at [email or web address], password [facility] 01/16/2012, 1:57 PM  LOS: 1 day

## 2012-01-16 NOTE — Progress Notes (Signed)
   CARE MANAGEMENT NOTE 01/16/2012  Patient:  [name], [name]   Account Number:  [id number]  Date Initiated:  01/16/2012  Documentation initiated by:  Jiles Crocker  Subjective/Objective Assessment:   ADMITTED WITH NAUSEA/ VOMITING, DIARRHEA, PYELONEPHRITIS     Action/Plan:   PCP IS DR Normajean Baxter  LIVES AT HOME WITH SPOUSE; POSSIBLY NEED HHC AT DISCHARGE; CM WILL CONTINUE TO FOLLOW FOR DCP   Anticipated DC Date:  01/23/2012   Anticipated DC Plan:  HOME/SELF CARE      DC Planning Services  CM consult           Status of service:  In process, will continue to follow Medicare Important Message given?  NA - LOS <3 / Initial given by admissions (If response is "NO", the following Medicare IM given date fields will be blank)  Per UR Regulation:  Reviewed for med. necessity/level of care/duration of stay  Comments:  01/16/2012- B Kosisochukwu Goldberg RN,BSN, MHA

## 2012-01-16 NOTE — Progress Notes (Signed)
Admitted with pyelonephritis.  PCP is Dr. Oliver Barre.    A1c currently in process.  Awaiting results.  Per records, patient takes large doses of Lantus at home: Lantus 60 units in the AM and 50 units in the PM along with Amaryl 4 mg daily.  Noted Novolog Moderate SSI started today.  If patient's CBGs become elevated, may want to initiate a portion of patient's home Lantus dose.  Would recommend only starting with 1/3 his home dose with titration as needed (Lantus 20 units in the AM and 16 units in the PM).  Will follow while inpatient. Ambrose Finland RN, MSN, CDE Diabetes Coordinator Inpatient Diabetes Program [phone]

## 2012-01-17 ENCOUNTER — Encounter (HOSPITAL_COMMUNITY)
Admission: EM | Disposition: A | Discharge status: Skilled Nursing Facility | Payer: Self-pay | Source: Home / Self Care | Attending: Internal Medicine

## 2012-01-17 ENCOUNTER — Encounter (HOSPITAL_COMMUNITY): Payer: Self-pay | Admitting: *Deleted

## 2012-01-17 DIAGNOSIS — K5289 Other specified noninfective gastroenteritis and colitis: Secondary | ICD-10-CM

## 2012-01-17 DIAGNOSIS — R197 Diarrhea, unspecified: Secondary | ICD-10-CM

## 2012-01-17 DIAGNOSIS — E860 Dehydration: Secondary | ICD-10-CM

## 2012-01-17 DIAGNOSIS — K5130 Ulcerative (chronic) rectosigmoiditis without complications: Secondary | ICD-10-CM

## 2012-01-17 DIAGNOSIS — N390 Urinary tract infection, site not specified: Secondary | ICD-10-CM

## 2012-01-17 DIAGNOSIS — F319 Bipolar disorder, unspecified: Secondary | ICD-10-CM

## 2012-01-17 HISTORY — PX: FLEXIBLE SIGMOIDOSCOPY: SHX5431

## 2012-01-17 LAB — BASIC METABOLIC PANEL
CO2: 17 mEq/L — ABNORMAL LOW (ref 19–32)
Calcium: 8.2 mg/dL — ABNORMAL LOW (ref 8.4–10.5)
Chloride: 111 mEq/L (ref 96–112)
Creatinine, Ser: 0.82 mg/dL (ref 0.50–1.35)
Glucose, Bld: 167 mg/dL — ABNORMAL HIGH (ref 70–99)

## 2012-01-17 LAB — GLUCOSE, CAPILLARY: Glucose-Capillary: 199 mg/dL — ABNORMAL HIGH (ref 70–99)

## 2012-01-17 LAB — CBC
HCT: 37.5 % — ABNORMAL LOW (ref 39.0–52.0)
Platelets: 212 10*3/uL (ref 150–400)
RBC: 4.08 MIL/uL — ABNORMAL LOW (ref 4.22–5.81)
RDW: 14.3 % (ref 11.5–15.5)
WBC: 9.2 10*3/uL (ref 4.0–10.5)

## 2012-01-17 SURGERY — SIGMOIDOSCOPY, FLEXIBLE
Anesthesia: Moderate Sedation

## 2012-01-17 MED ORDER — POTASSIUM CHLORIDE CRYS ER 20 MEQ PO TBCR
40.0000 meq | EXTENDED_RELEASE_TABLET | Freq: Two times a day (BID) | ORAL | Status: AC
  Administered 2012-01-17 (×2): 40 meq via ORAL
  Filled 2012-01-17 (×2): qty 2

## 2012-01-17 MED ORDER — VANCOMYCIN HCL IN DEXTROSE 1-5 GM/200ML-% IV SOLN
1000.0000 mg | Freq: Two times a day (BID) | INTRAVENOUS | Status: DC
  Administered 2012-01-18 – 2012-01-19 (×3): 1000 mg via INTRAVENOUS
  Filled 2012-01-17 (×5): qty 200

## 2012-01-17 MED ORDER — FENTANYL CITRATE 0.05 MG/ML IJ SOLN
INTRAMUSCULAR | Status: AC
  Filled 2012-01-17: qty 2

## 2012-01-17 MED ORDER — MIDAZOLAM HCL 10 MG/2ML IJ SOLN
INTRAMUSCULAR | Status: AC
  Filled 2012-01-17: qty 2

## 2012-01-17 MED ORDER — VANCOMYCIN HCL 10 G IV SOLR
2000.0000 mg | Freq: Once | INTRAVENOUS | Status: AC
  Administered 2012-01-17: 2000 mg via INTRAVENOUS
  Filled 2012-01-17: qty 2000

## 2012-01-17 MED ORDER — CITALOPRAM HYDROBROMIDE 20 MG PO TABS
20.0000 mg | ORAL_TABLET | Freq: Every morning | ORAL | Status: DC
  Administered 2012-01-18 – 2012-01-19 (×2): 20 mg via ORAL
  Filled 2012-01-17 (×2): qty 1

## 2012-01-17 MED ORDER — MIDAZOLAM HCL 10 MG/2ML IJ SOLN
INTRAMUSCULAR | Status: DC | PRN
  Administered 2012-01-17 (×2): 2.5 mg via INTRAVENOUS

## 2012-01-17 MED ORDER — FENTANYL CITRATE 0.05 MG/ML IJ SOLN
INTRAMUSCULAR | Status: DC | PRN
  Administered 2012-01-17 (×2): 25 ug via INTRAVENOUS

## 2012-01-17 MED ORDER — HYDROCORTISONE 100 MG/60ML RE ENEM
100.0000 mg | ENEMA | Freq: Every day | RECTAL | Status: DC
  Administered 2012-01-17 – 2012-01-18 (×2): 100 mg via RECTAL
  Filled 2012-01-17 (×4): qty 1

## 2012-01-17 MED ORDER — MESALAMINE 1.2 G PO TBEC
1.2000 g | DELAYED_RELEASE_TABLET | Freq: Three times a day (TID) | ORAL | Status: DC
  Administered 2012-01-17 – 2012-01-19 (×5): 1.2 g via ORAL
  Filled 2012-01-17 (×8): qty 1

## 2012-01-17 MED ORDER — SODIUM CHLORIDE 0.9 % IV SOLN: INTRAVENOUS | Status: DC

## 2012-01-17 MED ORDER — LOPERAMIDE HCL 2 MG PO CAPS
2.0000 mg | ORAL_CAPSULE | ORAL | Status: DC | PRN
  Administered 2012-01-17: 2 mg via ORAL
  Filled 2012-01-17: qty 1

## 2012-01-17 MED ORDER — MESALAMINE 1000 MG RE SUPP
1000.0000 mg | Freq: Every morning | RECTAL | Status: DC
  Administered 2012-01-18 – 2012-01-19 (×2): 1000 mg via RECTAL
  Filled 2012-01-17 (×2): qty 1

## 2012-01-17 NOTE — Consult Note (Signed)
Referring Provider: No ref. provider found Primary Care Physician:  Oliver Barre, MD Primary Gastroenterologist:  Dr. Victorino Dike  Reason for Consultation:  Rectal bleeding; history of UC  HPI: [name] is a 71 y.o. male with past medical history significant for coronary artery disease and diabetes mellitus presenting today with 2 days history of nausea, vomiting, and diarrhea.  He presented to the ED due to worsening of the diarrhea on the day of the visit (has been going every one to one and a half hours).  He has been passing blood in his stool as well.  Says that he has been seeing blood for at least the last two weeks.  The patient received antibiotics for a short period of time around 6 weeks ago for teeth extraction.  On the day of admission the family noted altered mental status with fever.   He actually had staph aureus grow in his urine and is being treated for pyelonephritis.  He continued passing blood in his stool.  Hgb is stable in the 13 gram range.  He says that he has a history of UC and was followed by Dr. Victorino Dike, but has not been seen or treated in several years.  Was initially diagnosed 28 years ago at which time he was hospitalized and on steroids for "the worst case of ulcerative colitis that Dr. Corinda Gubler had seen".  He had "flares" of his disease with some diarrhea and blood over the years but did fairly well without treatment.  He smoked on and off, which helped his disease as well; has not smoke for 3 years now.  No repeat colonoscopy since 1985.  Has some lower abdominal "soreness".  Past Medical History  Diagnosis Date  . CAD (coronary artery disease)     s/p 4V CABG 10/15/08; EF is 50%  . Obesity   . BPH (benign prostatic hypertrophy)   . Edema   . Left ventricular systolic dysfunction     EF is 40% per echo December 2012  . AMI, INFERIOR WALL 09/30/2008    Qualifier: Diagnosis of  By: Sherral Hammers, RN, BSN, Melanie    . CAD, ARTERY BYPASS GRAFT 11/18/2008   Qualifier: Diagnosis of  By: Clifton James, MD, Cristal Deer    . 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 UNIVERSAL ULCERATIVE COLITIS 07/31/2007    Qualifier: Diagnosis of  By: Arlyce Dice MD, Barbette Hair   . Ischemic cardiomyopathy 05/05/2011  . DM 09/01/2008    Qualifier: Diagnosis of  By: Kem Parkinson    . OBSTRUCTIVE SLEEP APNEA 09/16/2008    Qualifier: Diagnosis of  By: Shelle Iron MD, Maree Krabbe   . HYPERTENSION 09/01/2008    Qualifier: Diagnosis of  By: Kem Parkinson    . Hyperlipidemia 05/18/2010  . Gout 09/23/2011  . Bipolar affective disorder 09/23/2011  . TIA (transient ischemic attack) 09/23/2011  . Chronic pain 09/23/2011  . DNR (do not resuscitate) 09/23/2011    Per July 2011 [facility] admission  . CVA (cerebral infarction) 09/23/2011    Small left occipital  . 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 History of alcohol abuse 09/23/2011  . Blood in stool   . Depression   . Urine incontinence   . Stroke     "mini strokes"  . Incontinence of urine   .  Neuromuscular disorder     Diabetic neuropathy  . [place] spotted fever     Past Surgical History  Procedure Date  . Coronary artery bypass graft     Median sternotomy, extracorporeal circulation,  coronary artery bypass graft surgery x4 using a sequential left internal   mammary artery graft to the mid and distal left anterior descending, a   saphenous vein graft to diagonal branch of the left anterior descending,   and a saphenous vein graft to the obtuse marginal branch of left   circumflex coronary artery.    . Cardiac catheterization     Triple-vessel coronary artery disease. Low-normal left ventricular systolic function with mild   anteroapical wall motion abnormality.   . Tonsilectomy, adenoidectomy,  bilateral myringotomy and tubes 1946  . Multiple extractions with alveoloplasty 12/01/2011    Procedure: MULTIPLE EXTRACION WITH ALVEOLOPLASTY;  Surgeon: Charlynne Pander, DDS;  Location: [facility] OR;  Service: Oral Surgery;  Laterality: N/A;  Extraction of tooth #'s 3,4,5,6,7,8,9,10,11,12,13,14,15,16,17,20,21,22,23,24,25,26,27,28,29,30,31,32 with alveoloplasty and bilateral mandibular lingual tori    Prior to Admission medications   Medication Sig Start Date End Date Taking? Authorizing Provider  allopurinol (ZYLOPRIM) 300 MG tablet Take 1 tablet (300 mg total) by mouth daily. 12/12/11  Yes Corwin Levins, MD  aspirin 81 MG tablet Take 81 mg by mouth daily.     Yes Historical Provider, MD  carvedilol (COREG) 6.25 MG tablet Take 6.25 mg by mouth 2 (two) times daily with a meal.   Yes Historical Provider, MD  citalopram (CELEXA) 20 MG tablet Take 20 mg by mouth every morning. 09/28/11  Yes Corwin Levins, MD  fenofibrate 160 MG tablet Take 1 tablet (160 mg total) by mouth daily. 09/30/11 09/29/12 Yes Corwin Levins, MD  finasteride (PROSCAR) 5 MG tablet Take 5 mg by mouth every morning. 09/28/11  Yes Corwin Levins, MD  glimepiride (AMARYL) 4 MG tablet Take 1 tablet (4 mg total) by mouth daily before breakfast. 09/28/11  Yes Corwin Levins, MD  HYDROcodone-acetaminophen (LORTAB) 10-500 MG per tablet Take 1 tablet by mouth every 6 (six) hours as needed. pain 12/05/11  Yes Corwin Levins, MD  insulin glargine (LANTUS) 100 UNIT/ML injection Inject 50-60 Units into the skin 2 (two) times daily. Takes 60 units in the morning and 50 units at night   Yes Historical Provider, MD  simvastatin (ZOCOR) 40 MG tablet Take 40 mg by mouth at bedtime.     Yes Historical Provider, MD  solifenacin (VESICARE) 10 MG tablet Take 10 mg by mouth daily.   Yes Historical Provider, MD  Tamsulosin HCl (FLOMAX) 0.4 MG CAPS Take 0.4 mg by mouth daily after supper.    Yes Historical Provider, MD  topiramate (TOPAMAX) 200 MG tablet Take 200 mg by  mouth 2 (two) times daily.     Yes Historical Provider, MD    Current Facility-Administered Medications  Medication Dose Route Frequency Provider Last Rate Last Dose  . 0.9 % NaCl with KCl 20 mEq/ L  infusion   Intravenous Continuous Carron Curie, MD 100 mL/hr at 01/17/12 0216    . acetaminophen (TYLENOL) tablet 650 mg  650 mg Oral Q6H PRN Carron Curie, MD       Or  . acetaminophen (TYLENOL) suppository 650 mg  650 mg Rectal Q6H PRN Carron Curie, MD      . allopurinol (ZYLOPRIM) tablet 300 mg  300 mg Oral Daily Carron Curie, MD   300 mg at 01/17/12 1029  .  aspirin EC tablet 81 mg  81 mg Oral Daily Carron Curie, MD   81 mg at 01/17/12 1029  . ciprofloxacin (CIPRO) IVPB 400 mg  400 mg Intravenous Q12H Kathlen Mody, MD   400 mg at 01/17/12 0216  . darifenacin (ENABLEX) 24 hr tablet 7.5 mg  7.5 mg Oral Daily Carron Curie, MD   7.5 mg at 01/17/12 1029  . enoxaparin (LOVENOX) injection 40 mg  40 mg Subcutaneous QHS Carron Curie, MD   40 mg at 01/16/12 2210  . fenofibrate tablet 160 mg  160 mg Oral Daily Carron Curie, MD   160 mg at 01/17/12 1029  . finasteride (PROSCAR) tablet 5 mg  5 mg Oral q morning - 10a Carron Curie, MD   5 mg at 01/17/12 1030  . HYDROcodone-acetaminophen (NORCO/VICODIN) 5-325 MG per tablet 1 tablet  1 tablet Oral Q4H PRN Carron Curie, MD      . insulin aspart (novoLOG) injection 0-15 Units  0-15 Units Subcutaneous TID WC Kathlen Mody, MD   3 Units at 01/17/12 0836  . insulin aspart (novoLOG) injection 0-5 Units  0-5 Units Subcutaneous QHS Kathlen Mody, MD      . loperamide (IMODIUM) capsule 2 mg  2 mg Oral PRN Kathlen Mody, MD      . metroNIDAZOLE (FLAGYL) IVPB 500 mg  500 mg Intravenous Q8H Carron Curie, MD 100 mL/hr at 01/17/12 0717 500 mg at 01/17/12 0717  . morphine 2 MG/ML injection 1 mg  1 mg Intravenous Q4H PRN Carron Curie, MD      . ondansetron [facility]) tablet 4 mg  4 mg Oral Q6H PRN Carron Curie, MD       Or  . ondansetron (ZOFRAN) injection 4 mg  4 mg Intravenous Q6H PRN Carron Curie, MD        . pantoprazole (PROTONIX) injection 40 mg  40 mg Intravenous Q12H Carron Curie, MD   40 mg at 01/17/12 1029  . potassium chloride SA (K-DUR,KLOR-CON) CR tablet 40 mEq  40 mEq Oral BID Kathlen Mody, MD      . simvastatin (ZOCOR) tablet 40 mg  40 mg Oral QHS Carron Curie, MD   40 mg at 01/16/12 2211  . sodium chloride 0.9 % bolus 500 mL  500 mL Intravenous Q6H PRN Carron Curie, MD   500 mL at 01/15/12 2210  . Tamsulosin HCl (FLOMAX) capsule 0.4 mg  0.4 mg Oral QPC supper Carron Curie, MD   0.4 mg at 01/16/12 1707  . topiramate (TOPAMAX) tablet 200 mg  200 mg Oral BID Carron Curie, MD   200 mg at 01/17/12 1029  . zolpidem (AMBIEN) tablet 5 mg  5 mg Oral QHS PRN Carron Curie, MD   5 mg at 01/17/12 0254    Allergies as of 01/15/2012 - Review Complete 01/15/2012  Allergen Reaction Noted  . Dexedrine (dextroamphetamine sulfate er)  09/23/2011  . Oxycodone  12/05/2011    Family History  Problem Relation Age of Onset  . Diabetes Father   . Heart disease Father   . Heart attack Mother   . Aortic aneurysm Mother   . Alcohol abuse Other   . Arthritis Other   . Hyperlipidemia Other   . Heart disease Other   . Stroke Other   . Hypertension Other   . Diabetes Other   . Mental illness Other   . Alcohol abuse Other   . Arthritis Other   . Heart disease Other   . Mental illness Other   .  Diabetes Other     History   Social History  . Marital Status: Married    Spouse Name: N/A    Number of Children: N/A  . Years of Education: 12   Occupational History  . retired    Social History Main Topics  . Smoking status: Former Smoker    Quit date: 01/18/2008  . Smokeless tobacco: Never Used  . Alcohol Use: No  . Drug Use: No  . Sexually Active: No   Other Topics Concern  . Not on file   Social History Narrative   Patient has been married for 51 years.Patient presents with his wife today. Patient has 2 grown children and lives in [place] and [place] areas respectively.Patient is a nonsmoker,  nondrinker at this time.    Review of Systems: Ten point ROS is O/W negative except as mentioned in HPI.    Physical Exam: Vital signs in last 24 hours: Temp:  [98 F (36.7 C)-98.6 F (37 C)] 98.6 F (37 C) (12/31 0517) Pulse Rate:  [75-86] 86  (12/31 0517) Resp:  [16-18] 18  (12/31 0517) BP: (121-151)/(70-80) 136/70 mmHg (12/31 0517) SpO2:  [95 %-96 %] 95 % (12/31 0517) Last BM Date: 01/16/12 General:   Alert, Well-developed, well-nourished, pleasant and cooperative in NAD Head:  Normocephalic and atraumatic. Eyes:  Sclera clear, no icterus.  Conjunctiva pink. Ears:  Normal auditory acuity. Mouth:  No deformity or lesions.   Lungs:  Clear throughout to auscultation.  No wheezes, crackles, or rhonchi.  Heart:  Regular rate and rhythm; no murmurs, clicks, rubs, or gallops. Abdomen:  Soft, obese, non-distended, BS active, minimal lower abdominal TTP. Rectal:  Deferred.  Will be performed at the time of flex sig.  Msk:  Symmetrical without gross deformities. Pulses:  Normal pulses noted. Extremities:  Without clubbing or edema. Neurologic:  Alert and  oriented x4;  grossly normal neurologically. Skin:  Intact without significant lesions or rashes. Psych:  Alert and cooperative. Normal mood and affect.  Intake/Output from previous day: 12/30 0701 - 12/31 0700 In: 3660 [P.O.:660; I.V.:2400; IV Piggyback:600] Out: 550 [Urine:550] Intake/Output this shift: Total I/O In: 100 [IV Piggyback:100] Out: -   Lab Results:  Basename 01/17/12 0451 01/15/12 1636  WBC 9.2 10.4  HGB 13.1 13.8  HCT 37.5* 39.6  PLT 212 201   BMET  Basename 01/17/12 0451 01/16/12 1308 01/15/12 1636  NA 138 140 137  K 3.2* 3.4* 3.0*  CL 111 110 105  CO2 17* 19 17*  GLUCOSE 167* 209* 279*  BUN 11 18 29*  CREATININE 0.82 0.94 1.01  CALCIUM 8.2* 8.1* 8.4   LFT  Basename 01/15/12 1636  PROT 6.8  ALBUMIN 3.2*  AST 19  ALT 25  ALKPHOS 38*  BILITOT 0.5  BILIDIR --  IBILI --   IMPRESSION:    -Diarrhea and rectal bleeding with history of UC that has been untreated for several years.  Suspect that he has a relapse of his disease. -Pyelonephritis:  On abx. -Hypokalemia:  Repletion per primary service. -Insulin dependent DM   PLAN: -Flex sig today.  If it shows evidence of active UC then he will obviously need to be started on treatment for that. -Will need eventual full colonoscopy as an outpatient.   ZEHR, JESSICA D.  01/17/2012, 12:27 PM  Pager number [phone]  I have reviewed the above note, examined the patient and agree with plan of treatment. Suspect exacerbation of IBD,, r/o infectious etiology, doubt ischemic. Will proceed with flex.  Sigmoidoscopy to assess IBD.  Willa [facility] Pager # [phone]

## 2012-01-17 NOTE — Progress Notes (Signed)
[facility] PROGRESS NOTE  [name] WUJ:[id number] DOB: November 27, 1940 DOA: 01/15/2012 PCP: Oliver Barre, MD  Assessment/Plan: 1. Pyelonephritis :Cultures show staph aureus coagulase negative. Will start patient on IV vancomycin.  2. Nausea vomiting diarrhea with dehydration, C. difficile pcr is negative.  Probably gastroenteritis. He is currently on IV cipro and IV flagyl. He has a h/o Ulcerative colitis and his stool for occult blood is positive. Requested GI consult for possible sigmoidoscopy. Plan today.  3. Diabetes mellitus with hyperglycemia : on SSI 4. History of coronary artery disease stable with history of ischemic cardiomyopathy ejection fraction 40% reported from this admission : GENTLE Hydration.  5. History of hypertension among the patient was hypotensive in the emergency room  6. History of CVA  7. History of obstructive sleep apnea  8. History of BPH on Flomax, Foley placed in the emergency room   Code Status: full code Family Communication: wife at bedside and very anxious Disposition Plan: possibly in 1 to 2 days.    Consultants: GI   Antibiotics:  cipro 12/29  Flagyl 12/29  Rocephin one dose 12/29  Vancomycin 12/31  HPI/Subjective: Comfortable. Wants to go home as soon as possible.   Objective: Filed Vitals:   01/16/12 0406 01/16/12 1458 01/16/12 2107 01/17/12 0517  BP: 116/85 121/80 151/78 136/70  Pulse: 70 85 75 86  Temp: 99.1 F (37.3 C) 98.3 F (36.8 C) 98 F (36.7 C) 98.6 F (37 C)  TempSrc: Oral Oral Oral Oral  Resp: 18 16 18 18   Height:      Weight:      SpO2: 94% 96% 95% 95%    Intake/Output Summary (Last 24 hours) at 01/17/12 1132 Last data filed at 01/17/12 0717  Gross per 24 hour  Intake   3520 ml  Output    550 ml  Net   2970 ml   Filed Weights   01/16/12 0009  Weight: 104 kg (229 lb 4.5 oz)    Exam:   General:  Alert afebrile comfortable  Cardiovascular: s1s2 RRR  Respiratory: CTAB, no wheezing or  rhonchi.  Abdomen: soft NT ND BS+  Data Reviewed: Basic Metabolic Panel:  Lab 01/17/12 9562 01/16/12 1308 01/15/12 1636  NA 138 140 137  K 3.2* 3.4* 3.0*  CL 111 110 105  CO2 17* 19 17*  GLUCOSE 167* 209* 279*  BUN 11 18 29*  CREATININE 0.82 0.94 1.01  CALCIUM 8.2* 8.1* 8.4  MG -- 1.3* --  PHOS -- -- --   Liver Function Tests:  Lab 01/15/12 1636  AST 19  ALT 25  ALKPHOS 38*  BILITOT 0.5  PROT 6.8  ALBUMIN 3.2*    Lab 01/15/12 1636  LIPASE 21  AMYLASE --   No results found for this basename: AMMONIA:5 in the last 168 hours CBC:  Lab 01/17/12 0451 01/15/12 1636  WBC 9.2 10.4  NEUTROABS -- --  HGB 13.1 13.8  HCT 37.5* 39.6  MCV 91.9 91.0  PLT 212 201   Cardiac Enzymes: No results found for this basename: CKTOTAL:5,CKMB:5,CKMBINDEX:5,TROPONINI:5 in the last 168 hours BNP (last 3 results) No results found for this basename: PROBNP:3 in the last 8760 hours CBG:  Lab 01/17/12 0733 01/16/12 2105 01/16/12 1744 01/16/12 1207 01/16/12 0750  GLUCAP 199* 142* 189* 185* 147*    Recent Results (from the past 240 hour(s))  CULTURE, BLOOD (ROUTINE X 2)     Status: Normal (Preliminary result)   Collection Time   01/15/12  4:40 PM  Component Value Range Status Comment   Specimen Description BLOOD RIGHT ARM   Final    Special Requests BOTTLES DRAWN AEROBIC AND ANAEROBIC 5CC   Final    Culture  Setup Time 01/15/2012 23:00   Final    Culture     Final    Value:        BLOOD CULTURE RECEIVED NO GROWTH TO DATE CULTURE WILL BE HELD FOR 5 DAYS BEFORE ISSUING A FINAL NEGATIVE REPORT   Report Status PENDING   Incomplete   CULTURE, BLOOD (ROUTINE X 2)     Status: Normal (Preliminary result)   Collection Time   01/15/12  4:50 PM      Component Value Range Status Comment   Specimen Description BLOOD RIGHT ARM   Final    Special Requests BOTTLES DRAWN AEROBIC AND ANAEROBIC 9CC   Final    Culture  Setup Time 01/15/2012 23:00   Final    Culture     Final    Value:         BLOOD CULTURE RECEIVED NO GROWTH TO DATE CULTURE WILL BE HELD FOR 5 DAYS BEFORE ISSUING A FINAL NEGATIVE REPORT   Report Status PENDING   Incomplete   CLOSTRIDIUM DIFFICILE BY PCR     Status: Normal   Collection Time   01/15/12  5:29 PM      Component Value Range Status Comment   C difficile by pcr NEGATIVE  NEGATIVE Final   URINE CULTURE     Status: Normal (Preliminary result)   Collection Time   01/15/12  6:46 PM      Component Value Range Status Comment   Specimen Description URINE, CLEAN CATCH   Final    Special Requests NONE   Final    Culture  Setup Time 01/16/2012 01:37   Final    Colony Count >=100,000 COLONIES/ML   Final    Culture     Final    Value: STAPHYLOCOCCUS SPECIES (COAGULASE NEGATIVE)     Note: RIFAMPIN AND GENTAMICIN SHOULD NOT BE USED AS SINGLE DRUGS FOR TREATMENT OF STAPH INFECTIONS.   Report Status PENDING   Incomplete      Studies: No results found.  Scheduled Meds:    . allopurinol  300 mg Oral Daily  . aspirin EC  81 mg Oral Daily  . ciprofloxacin  400 mg Intravenous Q12H  . darifenacin  7.5 mg Oral Daily  . enoxaparin (LOVENOX) injection  40 mg Subcutaneous QHS  . fenofibrate  160 mg Oral Daily  . finasteride  5 mg Oral q morning - 10a  . insulin aspart  0-15 Units Subcutaneous TID WC  . insulin aspart  0-5 Units Subcutaneous QHS  . metronidazole  500 mg Intravenous Q8H  . pantoprazole (PROTONIX) IV  40 mg Intravenous Q12H  . potassium chloride  40 mEq Oral BID  . simvastatin  40 mg Oral QHS  . Tamsulosin HCl  0.4 mg Oral QPC supper  . topiramate  200 mg Oral BID   Continuous Infusions:    . 0.9 % NaCl with KCl 20 mEq / L 100 mL/hr at 01/17/12 0216    Principal Problem:  *Pyelonephritis Active Problems:  Gastroenteritis        [name]  [facility] Pager [phone] If 8PM-8AM, please contact night-coverage at [email or web address], password [facility] 01/17/2012, 11:32 AM  LOS: 2 days

## 2012-01-17 NOTE — Interval H&P Note (Signed)
History and Physical Interval Note:  01/17/2012 4:30 PM  [name]  has presented today for surgery, with the diagnosis of Rectal bleeding; history of UC  The various methods of treatment have been discussed with the patient and family. After consideration of risks, benefits and other options for treatment, the patient has consented to  Procedure(s) (LRB) with comments: FLEXIBLE SIGMOIDOSCOPY (N/A) as a surgical intervention .  The patient's history has been reviewed, patient examined, no change in status, stable for surgery.  I have reviewed the patient's chart and labs.  Questions were answered to the patient's satisfaction.     Lina Sar

## 2012-01-17 NOTE — Progress Notes (Signed)
ANTIBIOTIC CONSULT NOTE - INITIAL  Pharmacy Consult for Vancomycin Indication: Coag negative Staph UTI  Allergies  Allergen Reactions  . Dexedrine (Dextroamphetamine Sulfate Er)     agitation  . Oxycodone     nausea    Patient Measurements: Height: 5\' 8"  (172.7 cm) Weight: 229 lb 4.5 oz (104 kg) IBW/kg (Calculated) : 68.4    Vital Signs: Temp: 98.7 F (37.1 C) (12/31 1451) Temp src: Oral (12/31 1451) BP: 106/76 mmHg (12/31 1715) Pulse Rate: 84  (12/31 1451) Intake/Output from previous day: 12/30 0701 - 12/31 0700 In: 3660 [P.O.:660; I.V.:2400; IV Piggyback:600] Out: 550 [Urine:550] Intake/Output from this shift: Total I/O In: 460 [P.O.:360; IV Piggyback:100] Out: -   Labs:  Basename 01/17/12 0451 01/16/12 1308 01/15/12 1636  WBC 9.2 -- 10.4  HGB 13.1 -- 13.8  PLT 212 -- 201  LABCREA -- -- --  CREATININE 0.82 0.94 1.01   Estimated Creatinine Clearance: 93.7 ml/min (by C-G formula based on Cr of 0.82). No results found for this basename: VANCOTROUGH:2,VANCOPEAK:2,VANCORANDOM:2,GENTTROUGH:2,GENTPEAK:2,GENTRANDOM:2,TOBRATROUGH:2,TOBRAPEAK:2,TOBRARND:2,AMIKACINPEAK:2,AMIKACINTROU:2,AMIKACIN:2, in the last 72 hours   Microbiology: Recent Results (from the past 720 hour(s))  CULTURE, BLOOD (ROUTINE X 2)     Status: Normal (Preliminary result)   Collection Time   01/15/12  4:40 PM      Component Value Range Status Comment   Specimen Description BLOOD RIGHT ARM   Final    Special Requests BOTTLES DRAWN AEROBIC AND ANAEROBIC 5CC   Final    Culture  Setup Time 01/15/2012 23:00   Final    Culture     Final    Value:        BLOOD CULTURE RECEIVED NO GROWTH TO DATE CULTURE WILL BE HELD FOR 5 DAYS BEFORE ISSUING A FINAL NEGATIVE REPORT   Report Status PENDING   Incomplete   CULTURE, BLOOD (ROUTINE X 2)     Status: Normal (Preliminary result)   Collection Time   01/15/12  4:50 PM      Component Value Range Status Comment   Specimen Description BLOOD RIGHT ARM   Final    Special Requests BOTTLES DRAWN AEROBIC AND ANAEROBIC 9CC   Final    Culture  Setup Time 01/15/2012 23:00   Final    Culture     Final    Value:        BLOOD CULTURE RECEIVED NO GROWTH TO DATE CULTURE WILL BE HELD FOR 5 DAYS BEFORE ISSUING A FINAL NEGATIVE REPORT   Report Status PENDING   Incomplete   STOOL CULTURE     Status: Normal (Preliminary result)   Collection Time   01/15/12  5:29 PM      Component Value Range Status Comment   Specimen Description STOOL   Final    Special Requests NONE   Final    Culture NO SUSPICIOUS COLONIES, CONTINUING TO HOLD   Final    Report Status PENDING   Incomplete   CLOSTRIDIUM DIFFICILE BY PCR     Status: Normal   Collection Time   01/15/12  5:29 PM      Component Value Range Status Comment   C difficile by pcr NEGATIVE  NEGATIVE Final   URINE CULTURE     Status: Normal (Preliminary result)   Collection Time   01/15/12  6:46 PM      Component Value Range Status Comment   Specimen Description URINE, CLEAN CATCH   Final    Special Requests NONE   Final    Culture  Setup Time  01/16/2012 01:37   Final    Colony Count >=100,000 COLONIES/ML   Final    Culture     Final    Value: STAPHYLOCOCCUS SPECIES (COAGULASE NEGATIVE)     Note: RIFAMPIN AND GENTAMICIN SHOULD NOT BE USED AS SINGLE DRUGS FOR TREATMENT OF STAPH INFECTIONS.   Report Status PENDING   Incomplete     Medical History: Past Medical History  Diagnosis Date  . CAD (coronary artery disease)     s/p 4V CABG 10/15/08; EF is 50%  . Obesity   . BPH (benign prostatic hypertrophy)   . Edema   . Left ventricular systolic dysfunction     EF is 40% per echo December 2012  . AMI, INFERIOR WALL 09/30/2008    Qualifier: Diagnosis of  By: Sherral Hammers, RN, BSN, Melanie    . CAD, ARTERY BYPASS GRAFT 11/18/2008    Qualifier: Diagnosis of  By: Clifton James, MD, Cristal Deer    . 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 UNIVERSAL ULCERATIVE COLITIS 07/31/2007    Qualifier: Diagnosis of  By: Arlyce Dice MD, Barbette Hair   . Ischemic cardiomyopathy 05/05/2011  . DM 09/01/2008    Qualifier: Diagnosis of  By: Kem Parkinson    . OBSTRUCTIVE SLEEP APNEA 09/16/2008    Qualifier: Diagnosis of  By: Shelle Iron MD, Maree Krabbe   . HYPERTENSION 09/01/2008    Qualifier: Diagnosis of  By: Kem Parkinson    . Hyperlipidemia 05/18/2010  . Gout 09/23/2011  . Bipolar affective disorder 09/23/2011  . TIA (transient ischemic attack) 09/23/2011  . Chronic pain 09/23/2011  . DNR (do not resuscitate) 09/23/2011    Per July 2011 [place] admission  . CVA (cerebral infarction) 09/23/2011    Small left occipital  . 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 History of alcohol abuse 09/23/2011  . Blood in stool   . Depression   . Urine incontinence   . Stroke     "mini strokes"  . Incontinence of urine   . Neuromuscular disorder     Diabetic neuropathy  . [place] spotted fever     Medications:  Scheduled:    . allopurinol  300 mg Oral Daily  . aspirin EC  81 mg Oral Daily  . ciprofloxacin  400 mg Intravenous Q12H  . darifenacin  7.5 mg Oral Daily  . enoxaparin (LOVENOX) injection  40 mg Subcutaneous QHS  . fenofibrate  160 mg Oral Daily  . finasteride  5 mg Oral q morning - 10a  . hydrocortisone  100 mg Rectal QHS  . insulin aspart  0-15 Units Subcutaneous TID WC  . insulin aspart  0-5 Units Subcutaneous QHS  . mesalamine  1,000 mg Rectal q morning - 10a  . mesalamine  1.2 g Oral TID  . metronidazole  500 mg Intravenous Q8H  . pantoprazole (PROTONIX) IV  40 mg Intravenous Q12H  . [COMPLETED] potassium chloride  40 mEq Oral BID  . potassium chloride  40 mEq Oral BID  . simvastatin  40 mg Oral QHS  . Tamsulosin HCl  0.4 mg Oral QPC supper  . topiramate  200 mg Oral BID   Infusions:     . sodium chloride    . 0.9 % NaCl with KCl 20 mEq / L 100 mL/hr at  01/17/12 0216   Assessment:  71 yr old male presented on 12/29 with complaint of nausea/vomiting/diarrhea. Noted history of ulcerative colitis.  Admitted with pyelonephritis and suspected gastroenteritis with Cipro + Flagyl started  GI consulted and patient s/p sigmoidscopy with biopsy 12/31  Urine culture = + Coag negative Staph  IV Vancomycin to begin  CrCl (n) = 81 ml/min. Weight = 104 kg  Goal of Therapy:  Vancomycin trough level 10-15 mcg/ml  Plan:  Measure antibiotic drug levels at steady state Follow up culture results Vancomycin 2000mg  IV x 1 load then 1000mg  IV q12h   [name], Joselyn Glassman, PharmD 01/17/2012,5:19 PM

## 2012-01-17 NOTE — Evaluation (Signed)
Occupational Therapy Evaluation Patient Details Name: [name] MRN: [id number] DOB: 1940-04-22 Today's Date: 01/17/2012 Time: 0865-7846 OT Time Calculation (min): 26 min  OT Assessment / Plan / Recommendation Clinical Impression  71 y.o. male with past medical history significant for CAD and DM presenting today with 2 days history of N/V/D. Skilled OT indicated to address the below mentioned deficits in prep for d/c to next venue of care.    OT Assessment  Patient needs continued OT Services    Follow Up Recommendations  SNF;Supervision/Assistance - 24 hour    Barriers to Discharge Decreased caregiver support Son states his mother can no longer care for pt due to his chronic diarrhea and frequent falls.  Equipment Recommendations  None recommended by OT    Recommendations for Other Services    Frequency  Min 2X/week    Precautions / Restrictions Precautions Precautions: Fall Precaution Comments: stool incontinence/urgency   Pertinent Vitals/Pain Denied pain    ADL  Grooming: Performed;Wash/dry hands;Min guard Where Assessed - Grooming: Supported standing Upper Body Bathing: Set up Where Assessed - Upper Body Bathing: Unsupported sitting Lower Body Bathing: Moderate assistance Where Assessed - Lower Body Bathing: Supported sit to stand Upper Body Dressing: Set up Where Assessed - Upper Body Dressing: Unsupported sitting Where Assessed - Lower Body Dressing: Supported sit to stand Toilet Transfer: Minimal assistance Toilet Transfer Method: Sit to Barista: Comfort height toilet;Grab bars Toileting - Clothing Manipulation and Hygiene: Moderate assistance (for thoroughness with hygeine.) Where Assessed - Toileting Clothing Manipulation and Hygiene: Sit to stand from 3-in-1 or toilet Equipment Used: Rolling walker Transfers/Ambulation Related to ADLs: Pt ambulated to the bathroom with minguard A and VCs for safe manipulation of RW. ADL  Comments: Fatigues quickly.    OT Diagnosis: Generalized weakness  OT Problem List: Decreased activity tolerance;Decreased safety awareness;Decreased knowledge of use of DME or AE OT Treatment Interventions: Self-care/ADL training;Therapeutic activities;DME and/or AE instruction;Patient/family education   OT Goals Acute Rehab OT Goals OT Goal Formulation: With patient/family Time For Goal Achievement: 01/31/12 Potential to Achieve Goals: Good ADL Goals Pt Will Perform Grooming: with supervision;Standing at sink ADL Goal: Grooming - Progress: Goal set today Pt Will Perform Lower Body Bathing: with min assist;Sit to stand from chair;Sit to stand from bed ADL Goal: Lower Body Bathing - Progress: Goal set today Pt Will Transfer to Toilet: with min assist;with DME;Ambulation ADL Goal: Toilet Transfer - Progress: Goal set today Pt Will Perform Toileting - Clothing Manipulation: with min assist;Sitting on 3-in-1 or toilet;Standing ADL Goal: Toileting - Clothing Manipulation - Progress: Goal set today Pt Will Perform Toileting - Hygiene: with min assist;Sit to stand from 3-in-1/toilet ADL Goal: Toileting - Hygiene - Progress: Goal set today  Visit Information  Last OT Received On: 01/17/12 Assistance Needed: +1    Subjective Data  Subjective: I thought I was going to [place]... Patient Stated Goal: I wouldn't mind getting stronger...   Prior Functioning     Home Living Lives With: Spouse;Family Available Help at Discharge: Family Type of Home: House Home Access: Stairs to enter [employer] of Steps: 3 Entrance Stairs-Rails: None Home Layout: One level;Laundry or work area in [employer] Shower/Tub: Engineer, manufacturing systems: Handicapped height Home Adaptive Equipment: Hand-held shower hose;Grab bars around toilet;Grab bars in shower;Bedside commode/3-in-1;Walker - rolling;Straight cane Prior Function Level of Independence: Needs assistance Needs  Assistance: Bathing;Dressing;Light Housekeeping;Meal Prep Bath: Moderate Dressing: Moderate Meal Prep: Total Light Housekeeping: Total Driving: No Vocation: Retired Musician: No difficulties  Dominant Hand: Right         Vision/Perception     Cognition  Overall Cognitive Status: Appears within functional limits for tasks assessed/performed Arousal/Alertness: Awake/alert Orientation Level: Appears intact for tasks assessed Behavior During Session: [facility] for tasks performed    Extremity/Trunk Assessment Right Upper Extremity Assessment RUE ROM/Strength/Tone: [facility] for tasks assessed Left Upper Extremity Assessment LUE ROM/Strength/Tone: WFL for tasks assessed     Mobility Transfers Sit to Stand: 4: Min guard;4: Min assist;With upper extremity assist;From chair/3-in-1;From toilet;With armrests Stand to Sit: 4: Min guard;4: Min assist;With upper extremity assist;To toilet Details for Transfer Assistance: Pt required more assist to attempt to stand from toilet than chair. Cues for safety and hand placement.     Shoulder Instructions     Exercise     Balance Static Standing Balance Static Standing - Balance Support: No upper extremity supported;During functional activity Static Standing - Level of Assistance: 5: Stand by assistance   End of Session OT - End of Session Activity Tolerance: Patient tolerated treatment well Patient left: Other (comment) (ambulating with PT.)  GO     Bethenny Losee A OTR/L [phone] 01/17/2012, 3:11 PM

## 2012-01-17 NOTE — Progress Notes (Signed)
Physical Therapy Treatment Patient Details Name: [name] MRN: [id number] DOB: 02-01-1940 Today's Date: 01/17/2012 Time: 9562-1308 PT Time Calculation (min): 11 min  PT Assessment / Plan / Recommendation Comments on Treatment Session  Pt ambulated in hallway and then IV team came so unable to perform more therapy.  Family and pt report d/c plan now for SNF.    Follow Up Recommendations  SNF     Does the patient have the potential to tolerate intense rehabilitation     Barriers to Discharge        Equipment Recommendations  None recommended by PT    Recommendations for Other Services    Frequency     Plan Discharge plan needs to be updated    Precautions / Restrictions Precautions Precautions: Fall Precaution Comments: stool incontinence/urgency   Pertinent Vitals/Pain No pain reported.    Mobility  Bed Mobility Bed Mobility: Sit to Supine Sit to Supine: 3: Mod assist;HOB flat Details for Bed Mobility Assistance: assist for LEs onto bed Transfers Transfers: Stand to Sit Sit to Stand: 4: Min guard;4: Min assist;With upper extremity assist;From chair/3-in-1;From toilet;With armrests Stand to Sit: 4: Min guard;With upper extremity assist;To bed Details for Transfer Assistance: Pt ambulating out of bathroom with OT on arrival.  verbal cues for safe technique upon sitting on bed Ambulation/Gait Ambulation/Gait Assistance: 4: Min guard Ambulation Distance (Feet): 140 Feet Ambulation/Gait Assistance Details: verbal cues for RW safety Gait Pattern: Step-through pattern;Decreased stride length;Trunk flexed;Step-to pattern General Gait Details: pt step to leading with left foot however once made aware he ambulated step through    Exercises     PT Diagnosis:    PT Problem List:   PT Treatment Interventions:     PT Goals Acute Rehab PT Goals PT Goal: Supine/Side to Sit - Progress: Progressing toward goal PT Goal: Sit to Stand - Progress: Progressing toward  goal PT Goal: Stand to Sit - Progress: Progressing toward goal PT Goal: Ambulate - Progress: Progressing toward goal  Visit Information  Last PT Received On: 01/17/12 Assistance Needed: +1    Subjective Data  Subjective: You all have been real nice.   Cognition  Overall Cognitive Status: Appears within functional limits for tasks assessed/performed Arousal/Alertness: Awake/alert Orientation Level: Appears intact for tasks assessed Behavior During Session: [facility] for tasks performed    Balance  Static Standing Balance Static Standing - Balance Support: No upper extremity supported;During functional activity Static Standing - Level of Assistance: 5: Stand by assistance  End of Session PT - End of Session Activity Tolerance: Patient tolerated treatment well Patient left: in bed;with call bell/phone within reach;Other (comment) (with IV team )   GP     [name],KATHrine E 01/17/2012, 3:29 PM Pager: [phone]

## 2012-01-17 NOTE — Op Note (Signed)
[facility] [address] [place], [address]   FLEX SIGMOIDOSCOPY PROCEDURE REPORT  PATIENT: [name], [name]  MR#: [id number] BIRTHDATE: August 27, 1940 , 71  yrs. old GENDER: Male ENDOSCOPIST: Hart Carwin, MD REFERRED BY:Dr Akula PROCEDURE DATE:  01/17/2012 PROCEDURE:   Sigmoidoscopy with biopsy INDICATIONS:unexplained diarrhea and hematochezia. N&V,fever 101F, hx of IBD 28 years ago, treated with steroids MEDICATIONS: These medications were titrated to patient response per physician's verbal order, Versed-Detailed 5 mg IV, and Fentanyl 50 mcg IV  DESCRIPTION OF PROCEDURE:    Physical exam was performed.  Informed consent was obtained from the patient after explaining the benefits, risks, and alternatives to procedure.  The patient was connected to monitor and placed in left lateral position. Continuous oxygen was provided by nasal cannula and IV medicine administered through an indwelling cannula.  After administration of sedation and rectal exam, the patients rectum was intubated and the [id number] ([id number])  colonoscope was advanced under direct visualization to the cecum.  The scope was removed slowly by carefully examining the color, texture, anatomy, and integrity mucosa on the way out.  The patient was recovered in endoscopy and discharged home in satisfactory condition.    Multiple pseudopolypa from rectum to 20cm, normal mucosa from 40-60cm, acute colitis from 0-20cm with fiable bleeding mucosa, exudate, contact bleeding, biopsies taken from 0-20cm, 20-40 cm and 40-60cm, few scattered diverticuli   ,  PREP QUALITY: good CECAL W/D TIME:  COMPLICATIONS: None  ENDOSCOPIC IMPRESSION:   Acute moderately severe proctitisi0-20 cm c/w IBD, biopsies taken Extensive pseudopolyps in the sigmoid colon without evidence of active colitis 20-40cm, normal appearing colon 40cm to splenic flexure, s/p biopsies minimal sigmoid  diverticulosis  RECOMMENDATIONS: await biopsy results Cort enemas hs Canasa supp 1000 mg am Mesalamine 400mg , 3 po tid CT scan of the abdomen to look for TI disease ( Crohn's?), r/o partial SBO as a cause of acute N&V bowl rest consider IV steroids pending results of above      _______________________________ eSigned:  Hart Carwin, MD 01/17/2012 5:08 PM     PATIENT NAME:  [name], [name] MR#: [id number]

## 2012-01-18 ENCOUNTER — Inpatient Hospital Stay (HOSPITAL_COMMUNITY): Payer: PRIVATE HEALTH INSURANCE

## 2012-01-18 ENCOUNTER — Other Ambulatory Visit (HOSPITAL_COMMUNITY): Payer: Self-pay

## 2012-01-18 LAB — URINE CULTURE: Colony Count: >=100000

## 2012-01-18 LAB — GLUCOSE, CAPILLARY
Glucose-Capillary: 186 mg/dL — ABNORMAL HIGH (ref 70–99)
Glucose-Capillary: 223 mg/dL — ABNORMAL HIGH (ref 70–99)

## 2012-01-18 LAB — CBC
HCT: 38.3 % — ABNORMAL LOW (ref 39.0–52.0)
Hemoglobin: 12.8 g/dL — ABNORMAL LOW (ref 13.0–17.0)
MCV: 90.5 fL (ref 78.0–100.0)
RBC: 4.23 MIL/uL (ref 4.22–5.81)
WBC: 6.6 10*3/uL (ref 4.0–10.5)

## 2012-01-18 LAB — BASIC METABOLIC PANEL
BUN: 9 mg/dL (ref 6–23)
Creatinine, Ser: 0.81 mg/dL (ref 0.50–1.35)
GFR calc Af Amer: >90 mL/min (ref >90)
GFR calc non Af Amer: 86 mL/min — ABNORMAL LOW (ref >90)

## 2012-01-18 MED ORDER — IOHEXOL 300 MG/ML  SOLN
100.0000 mL | Freq: Once | INTRAMUSCULAR | Status: AC | PRN
  Administered 2012-01-18: 100 mL via INTRAVENOUS

## 2012-01-18 MED ORDER — SODIUM CHLORIDE 0.45 % IV SOLN
INTRAVENOUS | Status: DC
  Administered 2012-01-18: 75 mL/h via INTRAVENOUS

## 2012-01-18 NOTE — Progress Notes (Signed)
Subjective: Hematochezia.  No abdominal pain.  Objective: Vital signs in last 24 hours: Temp:  [98.3 F (36.8 C)-98.8 F (37.1 C)] 98.8 F (37.1 C) (01/01 0604) Pulse Rate:  [74-93] 74  (01/01 0604) Resp:  [16-26] 18  (01/01 0604) BP: (104-149)/(68-94) 149/84 mmHg (01/01 0604) SpO2:  [90 %-99 %] 95 % (01/01 0604) Last BM Date: 01/17/12  Intake/Output from previous day: 12/31 0701 - 01/01 0700 In: 2826.7 [P.O.:360; I.V.:1666.7; IV Piggyback:800] Out: 400 [Urine:400] Intake/Output this shift:    General appearance: alert and no distress GI: soft, non-tender; bowel sounds normal; no masses,  no organomegaly  Lab Results:  [facility] 01/17/12 0451 01/15/12 1636  WBC 9.2 10.4  HGB 13.1 13.8  HCT 37.5* 39.6  PLT 212 201   BMET  Basename 01/18/12 0459 01/17/12 0451 01/16/12 1308  NA 136 138 140  K 3.6 3.2* 3.4*  CL 110 111 110  CO2 15* 17* 19  GLUCOSE 246* 167* 209*  BUN 9 11 18   CREATININE 0.81 0.82 0.94  CALCIUM 9.0 8.2* 8.1*   LFT  Basename 01/15/12 1636  PROT 6.8  ALBUMIN 3.2*  AST 19  ALT 25  ALKPHOS 38*  BILITOT 0.5  BILIDIR --  IBILI --   PT/INR No results found for this basename: LABPROT:2,INR:2 in the last 72 hours Hepatitis Panel No results found for this basename: HEPBSAG,HCVAB,HEPAIGM,HEPBIGM in the last 72 hours C-Diff No results found for this basename: CDIFFTOX:3 in the last 72 hours Fecal Lactopherrin No results found for this basename: FECLLACTOFRN in the last 72 hours  Studies/Results: No results found.  Medications:  Scheduled:   . allopurinol  300 mg Oral Daily  . aspirin EC  81 mg Oral Daily  . ciprofloxacin  400 mg Intravenous Q12H  . citalopram  20 mg Oral q morning - 10a  . darifenacin  7.5 mg Oral Daily  . enoxaparin (LOVENOX) injection  40 mg Subcutaneous QHS  . fenofibrate  160 mg Oral Daily  . finasteride  5 mg Oral q morning - 10a  . hydrocortisone  100 mg Rectal QHS  . insulin aspart  0-15 Units Subcutaneous TID WC    . insulin aspart  0-5 Units Subcutaneous QHS  . mesalamine  1,000 mg Rectal q morning - 10a  . mesalamine  1.2 g Oral TID  . metronidazole  500 mg Intravenous Q8H  . pantoprazole (PROTONIX) IV  40 mg Intravenous Q12H  . simvastatin  40 mg Oral QHS  . Tamsulosin HCl  0.4 mg Oral QPC supper  . topiramate  200 mg Oral BID  . vancomycin  1,000 mg Intravenous Q12H   Continuous:   . 0.9 % NaCl with KCl 20 mEq / L 100 mL/hr at 01/17/12 0216    Assessment/Plan: 1) Proctitis and colitis.   No significant change in his symptoms.  He reports having this issue in the distant past - 1985.  No further issues until this time.    Plan: 1) Continue with Canasa enemas. 2) CT scan of the ABM/Pelvis as ordered to Dr. Juanda Chance to evaluate for TI involvement.  LOS: 3 days   Berlie Persky D 01/18/2012, 8:21 AM

## 2012-01-18 NOTE — Progress Notes (Signed)
Patient incontinent of bowel this AM noted large bright red sool with clots on the pad and also when patient was helped to the [facility] more bloody stool and clots noted, Dr Elnoria Howard came in to assess patient was informd and also saw the bloody stool with clots ordered CBC, Dr. Sharl Ma notified as well of the bloody stool/clot, D/C's Asprin and Lovenox. Will continue to asses patient.

## 2012-01-18 NOTE — Progress Notes (Signed)
Patient still incontinent of bowel but stool is not bloody and no clot anymore but loose, less frequent BM compare to yesterday (01/17/12). Will continue to assess patient.

## 2012-01-18 NOTE — Progress Notes (Signed)
Subjective: Patient seen and examined. Denies any nausea vomiting today. He had a CT scan of the abdomen which showed mild pericholecystic fluid around the gallbladder.  Objective: Vital signs in last 24 hours: Temp:  [97.4 F (36.3 C)-98.8 F (37.1 C)] 97.4 F (36.3 C) (01/01 1358) Pulse Rate:  [74-93] 78  (01/01 1358) Resp:  [16-25] 18  (01/01 0604) BP: (104-149)/(68-86) 147/73 mmHg (01/01 1358) SpO2:  [90 %-97 %] 96 % (01/01 1358) Weight change:  Last BM Date: 01/18/12  Consults: GI  Procedures: None  Intake/Output from previous day: 12/31 0701 - 01/01 0700 In: 2926.7 [P.O.:360; I.V.:1766.7; IV Piggyback:800] Out: 400 [Urine:400] Total I/O In: 1001.7 [I.V.:701.7; IV Piggyback:300] Out: -    Physical Exam: Head: Normocephalic, atraumatic.  Eyes: No signs of jaundice, EOMI Nose: Mucous membranes dry.  Neck: supple,No deformities, masses, or tenderness noted. Lungs: Normal respiratory effort. B/L Clear to auscultation, no crackles or wheezes.  Heart: Regular RR. S1 and S2 normal  Abdomen: BS normoactive. Soft, Nondistended, non-tender.  Extremities: No pretibial edema, no erythema   Lab Results: Basic Metabolic Panel:  Basename 01/18/12 0459 01/17/12 0451 01/16/12 1308  NA 136 138 --  K 3.6 3.2* --  CL 110 111 --  CO2 15* 17* --  GLUCOSE 246* 167* --  BUN 9 11 --  CREATININE 0.81 0.82 --  CALCIUM 9.0 8.2* --  MG -- -- 1.3*  PHOS -- -- --   Liver Function Tests: No results found for this basename: AST:2,ALT:2,ALKPHOS:2,BILITOT:2,PROT:2,ALBUMIN:2 in the last 72 hours No results found for this basename: LIPASE:2,AMYLASE:2 in the last 72 hours No results found for this basename: AMMONIA:2 in the last 72 hours CBC:  Basename 01/18/12 0844 01/17/12 0451  WBC 6.6 9.2  NEUTROABS -- --  HGB 12.8* 13.1  HCT 38.3* 37.5*  MCV 90.5 91.9  PLT 209 212   Cardiac Enzymes: No results found for this basename: CKTOTAL:3,CKMB:3,CKMBINDEX:3,TROPONINI:3 in the last 72  hours BNP: No results found for this basename: PROBNP:3 in the last 72 hours D-Dimer: No results found for this basename: DDIMER:2 in the last 72 hours CBG:  Basename 01/18/12 1232 01/18/12 0802 01/17/12 2145 01/17/12 1744 01/17/12 1155 01/17/12 0733  GLUCAP 186* 234* 194* 176* 213* 199*   Hemoglobin A1C:  Basename 01/16/12 1308  HGBA1C 7.9*  Urine Drug Screen: Drugs of Abuse     Component Value Date/Time   LABOPIA POSITIVE* 08/03/2009 0019   COCAINSCRNUR NONE DETECTED 08/03/2009 0019   LABBENZ NONE DETECTED 08/03/2009 0019   AMPHETMU NONE DETECTED 08/03/2009 0019   THCU NONE DETECTED 08/03/2009 0019   LABBARB  Value: NONE DETECTED        DRUG SCREEN FOR MEDICAL PURPOSES ONLY.  IF CONFIRMATION IS NEEDED FOR ANY PURPOSE, NOTIFY LAB WITHIN 5 DAYS.        LOWEST DETECTABLE LIMITS FOR URINE DRUG SCREEN Drug Class       Cutoff (ng/mL) Amphetamine      1000 Barbiturate      200 Benzodiazepine   200 Tricyclics       300 Opiates          300 Cocaine          300 THC              50 08/03/2009 0019    Alcohol Level: No results found for this basename: ETH:2 in the last 72 hours Urinalysis:  Basename 01/15/12 1846  COLORURINE YELLOW  LABSPEC 1.026  PHURINE 5.0  GLUCOSEU 500*  HGBUR MODERATE*  BILIRUBINUR SMALL*  KETONESUR NEGATIVE  PROTEINUR 30*  UROBILINOGEN 0.2  NITRITE NEGATIVE  LEUKOCYTESUR LARGE*   Recent Results (from the past 240 hour(s))  CULTURE, BLOOD (ROUTINE X 2)     Status: Normal (Preliminary result)   Collection Time   01/15/12  4:40 PM      Component Value Range Status Comment   Specimen Description BLOOD RIGHT ARM   Final    Special Requests BOTTLES DRAWN AEROBIC AND ANAEROBIC 5CC   Final    Culture  Setup Time 01/15/2012 23:00   Final    Culture     Final    Value:        BLOOD CULTURE RECEIVED NO GROWTH TO DATE CULTURE WILL BE HELD FOR 5 DAYS BEFORE ISSUING A FINAL NEGATIVE REPORT   Report Status PENDING   Incomplete   CULTURE, BLOOD (ROUTINE X 2)     Status:  Normal (Preliminary result)   Collection Time   01/15/12  4:50 PM      Component Value Range Status Comment   Specimen Description BLOOD RIGHT ARM   Final    Special Requests BOTTLES DRAWN AEROBIC AND ANAEROBIC 9CC   Final    Culture  Setup Time 01/15/2012 23:00   Final    Culture     Final    Value:        BLOOD CULTURE RECEIVED NO GROWTH TO DATE CULTURE WILL BE HELD FOR 5 DAYS BEFORE ISSUING A FINAL NEGATIVE REPORT   Report Status PENDING   Incomplete   STOOL CULTURE     Status: Normal (Preliminary result)   Collection Time   01/15/12  5:29 PM      Component Value Range Status Comment   Specimen Description STOOL   Final    Special Requests NONE   Final    Culture NO SUSPICIOUS COLONIES, CONTINUING TO HOLD   Final    Report Status PENDING   Incomplete   CLOSTRIDIUM DIFFICILE BY PCR     Status: Normal   Collection Time   01/15/12  5:29 PM      Component Value Range Status Comment   C difficile by pcr NEGATIVE  NEGATIVE Final   URINE CULTURE     Status: Normal   Collection Time   01/15/12  6:46 PM      Component Value Range Status Comment   Specimen Description URINE, CLEAN CATCH   Final    Special Requests NONE   Final    Culture  Setup Time 01/16/2012 01:37   Final    Colony Count >=100,000 COLONIES/ML   Final    Culture     Final    Value: STAPHYLOCOCCUS SPECIES (COAGULASE NEGATIVE)     Note: RIFAMPIN AND GENTAMICIN SHOULD NOT BE USED AS SINGLE DRUGS FOR TREATMENT OF STAPH INFECTIONS.   Report Status 01/18/2012 FINAL   Final    Organism ID, Bacteria STAPHYLOCOCCUS SPECIES (COAGULASE NEGATIVE)   Final     Studies/Results: Ct Abdomen Pelvis W Contrast  01/18/2012  *RADIOLOGY REPORT*  Clinical Data: Severe rectal bleeding.  Nausea, vomiting, and diarrhea.  Status post sigmoidoscopy.  History of ulcerative colitis.  Rule out small bowel obstruction.  CT ABDOMEN AND PELVIS WITH CONTRAST  Technique:  Multidetector CT imaging of the abdomen and pelvis was performed following the  standard protocol during bolus administration of intravenous contrast.  Contrast: OMNIPAQUE IOHEXOL 300 MG/ML  SOLN  Comparison: None.  Findings: Lung bases:  Mild  motion degradation at the lung bases. Right base subsegmental atelectasis.  Cardiomegaly with prior median sternotomy.  Lipomatous hypertrophy of the interatrial septum.  Small right greater than left bilateral pleural effusions.  Abdomen/pelvis:  Moderate hepatic steatosis.  Hepatomegaly, 18.9 cm cranial caudal. No focal liver lesion.  Normal spleen, stomach. Descending duodenal diverticulum.  Normal pancreas.  The gallbladder is distended, at 12.4 cm.  No calcified stones.  Cannot exclude minimal pericholecystic edema.  No biliary ductal dilatation.  Normal adrenal glands with too small to characterize lesions bilateral kidneys.  Aortic and branch vessel atherosclerosis which is advanced.  Small retroperitoneal nodes, without adenopathy.  Wall thickening involves the rectum, including on image 79/series 2.  Minimal surrounding edema with small nodes in the mesorectum.  Scattered diverticula throughout remainder of the colon.  Motion degradation continuing into the abdomen. Normal terminal ileum and appendix.  Small bowel loops are normal in caliber, without evidence of small bowel obstruction. No pneumatosis or free intraperitoneal air.  No ascites.  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  1.9 cm on the right and 1.7 cm on the left. No pelvic adenopathy.  There is mild edema adjacent to and wall thickening of the left side of the urinary bladder, including image 78/series 2. No significant free fluid.  No acute osseous abnormality.  Bones/Musculoskeletal:  No acute osseous abnormality.  IMPRESSION:  1.  No evidence of bowel obstruction. 2.  Rectal wall thickening which is most consistent with proctitis. Presumably related to ulcerative colitis. 3.  Hepatomegaly and hepatic steatosis. 4.  Gallbladder distention.  Cannot exclude minimal  adjacent pericholecystic edema.  Correlate with right upper quadrant symptoms and consider ultrasound. 5.  Bilateral common iliac artery aneurysms. 6.  Suspicion of mild left sided bladder wall thickening and adjacent edema.  This could represent cystitis or a component of bladder outlet obstruction. 7.  Motion degradation. 8.  Small bilateral pleural effusions with mild right base atelectasis.   Original Report Authenticated By: Jeronimo Greaves, M.D.     Medications: Scheduled Meds:   . allopurinol  300 mg Oral Daily  . ciprofloxacin  400 mg Intravenous Q12H  . citalopram  20 mg Oral q morning - 10a  . darifenacin  7.5 mg Oral Daily  . fenofibrate  160 mg Oral Daily  . finasteride  5 mg Oral q morning - 10a  . hydrocortisone  100 mg Rectal QHS  . insulin aspart  0-15 Units Subcutaneous TID WC  . insulin aspart  0-5 Units Subcutaneous QHS  . mesalamine  1,000 mg Rectal q morning - 10a  . mesalamine  1.2 g Oral TID  . metronidazole  500 mg Intravenous Q8H  . pantoprazole (PROTONIX) IV  40 mg Intravenous Q12H  . simvastatin  40 mg Oral QHS  . Tamsulosin HCl  0.4 mg Oral QPC supper  . topiramate  200 mg Oral BID  . vancomycin  1,000 mg Intravenous Q12H   Continuous Infusions:   . 0.9 % NaCl with KCl 20 mEq / L 100 mL/hr at 01/18/12 1346   PRN Meds:.acetaminophen, acetaminophen, HYDROcodone-acetaminophen, loperamide, morphine injection, ondansetron (ZOFRAN) IV, ondansetron, sodium chloride, zolpidem    Principal Problem:  *Pyelonephritis Active Problems:  Gastroenteritis  Ulcerative (chronic) proctosigmoiditis  Diarrhea  1. Pyelonephritis :Cultures show staph aureus coagulase negative. Will start patient on IV vancomycin.  2. Nausea vomiting diarrhea with dehydration, C. difficile pcr is negative. Probably gastroenteritis. He is currently on IV cipro and IV flagyl. He has a h/o Ulcerative  colitis and his stool for occult blood is positive. Will get abdominal ultrasound. 3.Proctitis/  colitis: Canasa enemas 4. Diabetes mellitus with hyperglycemia : on SSI  5. History of coronary artery disease stable with history of ischemic cardiomyopathy ejection fraction 40% reported from this admission : GENTLE Hydration.  6. History of hypertension : stable 7. History of CVA  8. History of obstructive sleep apnea  9 History of BPH on Flomax, Foley placed in the emergency room    LOS: 3 days  Assessment/Plan: [facility] Pager: [phone] 01/18/2012, 4:54 PM

## 2012-01-19 ENCOUNTER — Inpatient Hospital Stay (HOSPITAL_COMMUNITY): Payer: PRIVATE HEALTH INSURANCE

## 2012-01-19 ENCOUNTER — Encounter (HOSPITAL_COMMUNITY): Payer: Self-pay | Admitting: Dentistry

## 2012-01-19 ENCOUNTER — Encounter: Payer: Self-pay | Admitting: Internal Medicine

## 2012-01-19 ENCOUNTER — Encounter (HOSPITAL_COMMUNITY): Payer: Self-pay | Admitting: Internal Medicine

## 2012-01-19 DIAGNOSIS — N12 Tubulo-interstitial nephritis, not specified as acute or chronic: Principal | ICD-10-CM

## 2012-01-19 DIAGNOSIS — I2510 Atherosclerotic heart disease of native coronary artery without angina pectoris: Secondary | ICD-10-CM

## 2012-01-19 LAB — CBC
HCT: 38.6 % — ABNORMAL LOW (ref 39.0–52.0)
Hemoglobin: 13.6 g/dL (ref 13.0–17.0)
MCH: 31.4 pg (ref 26.0–34.0)
MCHC: 35.2 g/dL (ref 30.0–36.0)
RBC: 4.33 MIL/uL (ref 4.22–5.81)

## 2012-01-19 LAB — BASIC METABOLIC PANEL
BUN: 7 mg/dL (ref 6–23)
CO2: 17 mEq/L — ABNORMAL LOW (ref 19–32)
Chloride: 106 mEq/L (ref 96–112)
Creatinine, Ser: 0.85 mg/dL (ref 0.50–1.35)
GFR calc Af Amer: >90 mL/min (ref >90)
Potassium: 3.1 mEq/L — ABNORMAL LOW (ref 3.5–5.1)

## 2012-01-19 LAB — GLUCOSE, CAPILLARY: Glucose-Capillary: 236 mg/dL — ABNORMAL HIGH (ref 70–99)

## 2012-01-19 MED ORDER — POTASSIUM CHLORIDE CRYS ER 20 MEQ PO TBCR
20.0000 meq | EXTENDED_RELEASE_TABLET | Freq: Two times a day (BID) | ORAL | Status: DC
  Administered 2012-01-19: 20 meq via ORAL
  Filled 2012-01-19 (×2): qty 1

## 2012-01-19 MED ORDER — HYDROCORTISONE 100 MG/60ML RE ENEM: 100.0000 mg | ENEMA | Freq: Every day | RECTAL | Status: DC

## 2012-01-19 MED ORDER — LOPERAMIDE HCL 2 MG PO CAPS: 2.0000 mg | ORAL_CAPSULE | ORAL | Status: DC | PRN

## 2012-01-19 MED ORDER — HYDROCODONE-ACETAMINOPHEN 10-500 MG PO TABS: 1.0000 | ORAL_TABLET | Freq: Four times a day (QID) | ORAL | Status: DC | PRN

## 2012-01-19 MED ORDER — MESALAMINE 1.2 G PO TBEC: 1.2000 g | DELAYED_RELEASE_TABLET | Freq: Three times a day (TID) | ORAL | Status: DC

## 2012-01-19 MED ORDER — MESALAMINE 1000 MG RE SUPP: 1000.0000 mg | Freq: Every morning | RECTAL | Status: DC

## 2012-01-19 MED ORDER — DOXYCYCLINE HYCLATE 100 MG PO TABS: 100.0000 mg | ORAL_TABLET | Freq: Two times a day (BID) | ORAL | Status: AC

## 2012-01-19 MED ORDER — POTASSIUM CHLORIDE CRYS ER 20 MEQ PO TBCR: 20.0000 meq | EXTENDED_RELEASE_TABLET | Freq: Two times a day (BID) | ORAL | Status: DC

## 2012-01-19 NOTE — Progress Notes (Signed)
Patient is set to discharge to [facility] today. Patient & wife are aware & agreeable with plan. [facility] called for 3:30 pickup to transport to facility.  Clinical Social Work Department CLINICAL SOCIAL WORK PLACEMENT NOTE 01/19/2012  Patient:  [name], [name]  Account Number:  [id number] Admit date:  01/15/2012  Clinical Social Worker:  Orpah Greek  Date/time:  01/16/2012 11:23 AM  Clinical Social Work is seeking post-discharge placement for this patient at the following level of care:   SKILLED NURSING   (*CSW will update this form in Epic as items are completed)   01/16/2012  Patient/family provided with [facility] Department of Clinical Social Work's list of facilities offering this level of care within the geographic area requested by the patient (or if unable, by the patient's family).  01/16/2012  Patient/family informed of their freedom to choose among providers that offer the needed level of care, that participate in Medicare, Medicaid or managed care program needed by the patient, have an available bed and are willing to accept the patient.  01/16/2012  Patient/family informed of [facility]' ownership interest in [facility], as well as of the fact that they are under no obligation to receive care at this facility.  PASARR submitted to EDS on 01/16/2012 PASARR number received from [employer] on 01/16/2012  FL2 transmitted to all facilities in geographic area requested by pt/family on  01/16/2012 FL2 transmitted to all facilities within larger geographic area on   Patient informed that his/her managed care company has contracts with or will negotiate with  certain facilities, including the following:     Patient/family informed of bed offers received:  01/19/2012 Patient chooses bed at [facility] Physician recommends and patient chooses bed at    Patient to be transferred to [facility] on  01/19/2012 Patient to be transferred  to facility by [facility]  The following physician request were entered in Epic:   Additional Comments:  Unice Bailey, LCSW [facility] Clinical Social Worker cell #: [phone]

## 2012-01-19 NOTE — Progress Notes (Signed)
Inpatient Diabetes Program Recommendations  AACE/ADA: New Consensus Statement on Inpatient Glycemic Control (2013)  Target Ranges:  Prepandial:   less than 140 mg/dL      Peak postprandial:   less than 180 mg/dL (1-2 hours)      Critically ill patients:  140 - 180 mg/dL   Reason for Visit: CBGs 01/18/12  [phone] mg/dl        01/21/76  295-621 mg/dl  Inpatient Diabetes Program Recommendations Insulin - Basal: Start Lantus 20 units in the AM and 16 units in the evening.  This is about 1/3 the dosage taken at home.  Note: continue Novolog MODERATE correction scale AC & HS.

## 2012-01-19 NOTE — Discharge Summary (Addendum)
Physician Discharge Summary  [name] WUJ:[id number] DOB: [birth year] DOA: 01/15/2012  PCP: Oliver Barre, MD  Admit date: 01/15/2012 Discharge date: 01/19/2012  Time spent:  50  minutes  Recommendations for Outpatient Follow-up:  1. Will need to check BMP on Monday 01/24/12 2. Followup Amy [facility] on 01/30/2012 at 2:30 PM  [facility]  Discharge Diagnoses:  Principal Problem:  *Pyelonephritis Active Problems:  Gastroenteritis  Ulcerative (chronic) proctosigmoiditis  Diarrhea   Discharge Condition: Stable  Diet recommendation: Low residue diet  Filed Weights   01/16/12 0009  Weight: 104 kg (229 lb 4.5 oz)    History of present illness:  72 y.o. male with past medical history significant for coronary artery disease and diabetes mellitus presented with  2 days history of nausea, vomiting, and diarrhea. He presented to the ED due to worsening of the diarrhea on the day of the visit. He has been passing blood in his stool as well. Says that he has been seeing blood for at least the last two weeks. The patient received antibiotics for a short period of time around 6 weeks ago for teeth extraction. On the day of admission the family noted altered mental status with fever.  Patient was admitted for pyelonephritis and started on Vancomycin for coagulase negative staph aureus.   Hospital Course:   Altered mental status And patient was noted with pyelonephritis, and urine culture grew one is negative staph so he was started on vancomycin. Patient has received 5 days of antibiotics. At this time his mental status has cleared. The urine culture sensitivities shows coagulase-negative staph sensitive to tetracycline so he'll be sent to nursing facility with 7 more days of by mouth doxycycline 100 mg by mouth twice a day.  Hypokalemia The patient's potassium level is 3.1, he has been getting by mouth potassium. We'll discharge him on 20 mEq by mouth twice a day potassium 45 more days and he  will need a repeat basic metabolic panel done at the nursing facility on Monday 01/24/12  Rectal bleeding Patient was seen by  GI for the rectal bleeding, he underwent sigmoidoscopy which showed proctitis. Patient was started on corticosteroid enemas, Canasa suppositories Q. a.m. and Lialda 1.2 g 3 times a day. At this time the bleeding has stopped, he does not have any more diarrhea. He'll continue on these above medications and will follow Dr. Julio Alm in 2 weeks. Stool for C. difficile PCR was negative. Patient had a CT abdomen pelvis as well as abdominal ultrasound which were negative for any bowel pathology. Ultrasound abdomen was ordered as there was question pericholecystic fluid on the CT abdomen. But the ultrasound did not show any gallbladder pathology.  Diabetes mellitus Patient was started on sliding-scale insulin, will switch him back to Lantus and glimepride.  Nausea vomiting Patient was started on gentle hydration for possible gastroenteritis. Also was given Cipro and Flagyl which has been discontinued as per GI recommendation. Nausea vomiting has resolved at this time.  Procedures:  Sigmoidoscopy  Consultations:  GI  Discharge Exam: Filed Vitals:   01/18/12 1010 01/18/12 1358 01/18/12 2143 01/19/12 0623  BP: 131/86 147/73 152/77 138/69  Pulse:  78 88 84  Temp:  97.4 F (36.3 C) 98.3 F (36.8 C) 98 F (36.7 C)  TempSrc:  Oral Oral Oral  Resp:   20 20  Height:      Weight:      SpO2:  96% 93% 96%    General: Appearing no acute distress Cardiovascular: S1-S2  regular Respiratory: Clear bilaterally Extremities: No edema Abdomen: Soft nontender no organomegaly  Discharge Instructions  Discharge Orders    Future Appointments: Provider: Department: Dept Phone: Center:   01/30/2012 2:30 PM Amy Oswald Hillock, PA [facility] [phone] [facility]   03/07/2012 1:30 PM Corwin Levins, MD [facility] [phone] [facility]      Future Orders Please Complete By Expires   Diet - low sodium heart healthy      Increase activity slowly      Discharge instructions      Comments:   Will need to check potassium level on Monday 02/23/11       Medication List     As of 01/19/2012  1:46 PM    TAKE these medications         allopurinol 300 MG tablet   Commonly known as: ZYLOPRIM   Take 1 tablet (300 mg total) by mouth daily.      aspirin 81 MG tablet   Take 81 mg by mouth daily.      carvedilol 6.25 MG tablet   Commonly known as: COREG   Take 6.25 mg by mouth 2 (two) times daily with a meal.      citalopram 20 MG tablet   Commonly known as: CELEXA   Take 20 mg by mouth every morning.      doxycycline 100 MG tablet   Commonly known as: VIBRA-TABS   Take 1 tablet (100 mg total) by mouth 2 (two) times daily.      fenofibrate 160 MG tablet   Take 1 tablet (160 mg total) by mouth daily.      finasteride 5 MG tablet   Commonly known as: PROSCAR   Take 5 mg by mouth every morning.      FLOMAX 0.4 MG Caps   Generic drug: Tamsulosin HCl   Take 0.4 mg by mouth daily after supper.      glimepiride 4 MG tablet   Commonly known as: AMARYL   Take 1 tablet (4 mg total) by mouth daily before breakfast.      HYDROcodone-acetaminophen 10-500 MG per tablet   Commonly known as: LORTAB   Take 1 tablet by mouth every 6 (six) hours as needed. pain      hydrocortisone 100 MG/60ML enema   Commonly known as: CORTENEMA   Place 1 enema (100 mg total) rectally at bedtime.      insulin glargine 100 UNIT/ML injection   Commonly known as: LANTUS   Inject 50-60 Units into the skin 2 (two) times daily. Takes 60 units in the morning and 50 units at night      loperamide 2 MG capsule   Commonly known as: IMODIUM   Take 1 capsule (2 mg total) by mouth as needed for diarrhea or loose stools.      mesalamine 1000 MG suppository   Commonly known as: CANASA   Place 1 suppository (1,000 mg total) rectally every morning.        mesalamine 1.2 G EC tablet   Commonly known as: LIALDA   Take 1 tablet (1.2 g total) by mouth 3 (three) times daily.      potassium chloride SA 20 MEQ tablet   Commonly known as: K-DUR,KLOR-CON   Take 1 tablet (20 mEq total) by mouth 2 (two) times daily.      simvastatin 40 MG tablet   Commonly known as: ZOCOR   Take 40 mg by mouth at bedtime.  solifenacin 10 MG tablet   Commonly known as: VESICARE   Take 10 mg by mouth daily.      topiramate 200 MG tablet   Commonly known as: TOPAMAX   Take 200 mg by mouth 2 (two) times daily.           Follow-up Information    Follow up with Mike Gip, PA. On 01/30/2012. (at 2:30pm)    Contact information:   [address] [place] [address] [phone]           The results of significant diagnostics from this hospitalization (including imaging, microbiology, ancillary and laboratory) are listed below for reference.    Significant Diagnostic Studies: US Abdomen Complete  01/19/2012  *RADIOLOGY REPORT*  Clinical Data:  Abdominal pain.  Nausea and vomiting.  Distended gallbladder seen on recent CT peri  ABDOMINAL ULTRASOUND COMPLETE  Comparison:  CT on 01/18/2012  Findings: Technically difficult scan due to large patient habitus.  Gallbladder:  No gallstones, gallbladder wall thickening, or pericholecystic fluid.  Common Bile Duct:  Within normal limits in caliber. Measures 5 mm in diameter.  Liver: Diffusely increased parenchymal echogenicity, consistent with hepatic steatosis.  No focal liver mass identified.  IVC:  Appears normal.  Pancreas:  No abnormality identified.  Spleen:  Within normal limits in size and echotexture.  Right kidney:  Normal in size and parenchymal echogenicity.  No evidence of mass or hydronephrosis.  Left kidney:  Normal in size and parenchymal echogenicity.  No evidence of mass or hydronephrosis.  Abdominal Aorta:  No aneurysm identified.  IMPRESSION:  1.  No evidence of  gallstones, biliary dilatation, or other acute findings. 2.  Hepatic steatosis.   Original Report Authenticated By: Myles Rosenthal, M.D.    Ct Abdomen Pelvis W Contrast  01/18/2012  *RADIOLOGY REPORT*  Clinical Data: Severe rectal bleeding.  Nausea, vomiting, and diarrhea.  Status post sigmoidoscopy.  History of ulcerative colitis.  Rule out small bowel obstruction.  CT ABDOMEN AND PELVIS WITH CONTRAST  Technique:  Multidetector CT imaging of the abdomen and pelvis was performed following the standard protocol during bolus administration of intravenous contrast.  Contrast: OMNIPAQUE IOHEXOL 300 MG/ML  SOLN  Comparison: None.  Findings: Lung bases:  Mild motion degradation at the lung bases. Right base subsegmental atelectasis.  Cardiomegaly with prior median sternotomy.  Lipomatous hypertrophy of the interatrial septum.  Small right greater than left bilateral pleural effusions.  Abdomen/pelvis:  Moderate hepatic steatosis.  Hepatomegaly, 18.9 cm cranial caudal. No focal liver lesion.  Normal spleen, stomach. Descending duodenal diverticulum.  Normal pancreas.  The gallbladder is distended, at 12.4 cm.  No calcified stones.  Cannot exclude minimal pericholecystic edema.  No biliary ductal dilatation.  Normal adrenal glands with too small to characterize lesions bilateral kidneys.  Aortic and branch vessel atherosclerosis which is advanced.  Small retroperitoneal nodes, without adenopathy.  Wall thickening involves the rectum, including on image 79/series 2.  Minimal surrounding edema with small nodes in the mesorectum.  Scattered diverticula throughout remainder of the colon.  Motion degradation continuing into the abdomen. Normal terminal ileum and appendix.  Small bowel loops are normal in caliber, without evidence of small bowel obstruction. No pneumatosis or free intraperitoneal air.  No ascites.  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  1.9 cm on the right and 1.7 cm on the left. No pelvic  adenopathy.  There is mild edema adjacent to and wall thickening of the left side of  the urinary bladder, including image 78/series 2. No significant free fluid.  No acute osseous abnormality.  Bones/Musculoskeletal:  No acute osseous abnormality.  IMPRESSION:  1.  No evidence of bowel obstruction. 2.  Rectal wall thickening which is most consistent with proctitis. Presumably related to ulcerative colitis. 3.  Hepatomegaly and hepatic steatosis. 4.  Gallbladder distention.  Cannot exclude minimal adjacent pericholecystic edema.  Correlate with right upper quadrant symptoms and consider ultrasound. 5.  Bilateral common iliac artery aneurysms. 6.  Suspicion of mild left sided bladder wall thickening and adjacent edema.  This could represent cystitis or a component of bladder outlet obstruction. 7.  Motion degradation. 8.  Small bilateral pleural effusions with mild right base atelectasis.   Original Report Authenticated By: Jeronimo Greaves, M.D.     Microbiology: Recent Results (from the past 240 hour(s))  CULTURE, BLOOD (ROUTINE X 2)     Status: Normal (Preliminary result)   Collection Time   01/15/12  4:40 PM      Component Value Range Status Comment   Specimen Description BLOOD RIGHT ARM   Final    Special Requests BOTTLES DRAWN AEROBIC AND ANAEROBIC 5CC   Final    Culture  Setup Time 01/15/2012 23:00   Final    Culture     Final    Value:        BLOOD CULTURE RECEIVED NO GROWTH TO DATE CULTURE WILL BE HELD FOR 5 DAYS BEFORE ISSUING A FINAL NEGATIVE REPORT   Report Status PENDING   Incomplete   CULTURE, BLOOD (ROUTINE X 2)     Status: Normal (Preliminary result)   Collection Time   01/15/12  4:50 PM      Component Value Range Status Comment   Specimen Description BLOOD RIGHT ARM   Final    Special Requests BOTTLES DRAWN AEROBIC AND ANAEROBIC 9CC   Final    Culture  Setup Time 01/15/2012 23:00   Final    Culture     Final    Value:        BLOOD CULTURE RECEIVED NO GROWTH TO DATE CULTURE WILL BE  HELD FOR 5 DAYS BEFORE ISSUING A FINAL NEGATIVE REPORT   Report Status PENDING   Incomplete   STOOL CULTURE     Status: Normal   Collection Time   01/15/12  5:29 PM      Component Value Range Status Comment   Specimen Description STOOL   Final    Special Requests NONE   Final    Culture     Final    Value: NO SALMONELLA, SHIGELLA, CAMPYLOBACTER, YERSINIA, OR E.COLI 0157:H7 ISOLATED   Report Status 01/19/2012 FINAL   Final   CLOSTRIDIUM DIFFICILE BY PCR     Status: Normal   Collection Time   01/15/12  5:29 PM      Component Value Range Status Comment   C difficile by pcr NEGATIVE  NEGATIVE Final   URINE CULTURE     Status: Normal   Collection Time   01/15/12  6:46 PM      Component Value Range Status Comment   Specimen Description URINE, CLEAN CATCH   Final    Special Requests NONE   Final    Culture  Setup Time 01/16/2012 01:37   Final    Colony Count >=100,000 COLONIES/ML   Final    Culture     Final    Value: STAPHYLOCOCCUS SPECIES (COAGULASE NEGATIVE)     Note: RIFAMPIN AND GENTAMICIN SHOULD NOT  BE USED AS SINGLE DRUGS FOR TREATMENT OF STAPH INFECTIONS.   Report Status 01/18/2012 FINAL   Final    Organism ID, Bacteria STAPHYLOCOCCUS SPECIES (COAGULASE NEGATIVE)   Final      Labs: Basic Metabolic Panel:  Lab 01/19/12 1191 01/18/12 0459 01/17/12 0451 01/16/12 1308 01/15/12 1636  NA 135 136 138 140 137  K 3.1* 3.6 3.2* 3.4* 3.0*  CL 106 110 111 110 105  CO2 17* 15* 17* 19 17*  GLUCOSE 219* 246* 167* 209* 279*  BUN 7 9 11 18  29*  CREATININE 0.85 0.81 0.82 0.94 1.01  CALCIUM 9.1 9.0 8.2* 8.1* 8.4  MG -- -- -- 1.3* --  PHOS -- -- -- -- --   Liver Function Tests:  Lab 01/15/12 1636  AST 19  ALT 25  ALKPHOS 38*  BILITOT 0.5  PROT 6.8  ALBUMIN 3.2*    Lab 01/15/12 1636  LIPASE 21  AMYLASE --   No results found for this basename: AMMONIA:5 in the last 168 hours CBC:  Lab 01/19/12 0448 01/18/12 0844 01/17/12 0451 01/15/12 1636  WBC 8.0 6.6 9.2 10.4  NEUTROABS  -- -- -- --  HGB 13.6 12.8* 13.1 13.8  HCT 38.6* 38.3* 37.5* 39.6  MCV 89.1 90.5 91.9 91.0  PLT 230 209 212 201   Cardiac Enzymes: No results found for this basename: CKTOTAL:5,CKMB:5,CKMBINDEX:5,TROPONINI:5 in the last 168 hours BNP: BNP (last 3 results) No results found for this basename: PROBNP:3 in the last 8760 hours CBG:  Lab 01/19/12 1223 01/19/12 0743 01/18/12 2145 01/18/12 1710 01/18/12 1232  GLUCAP 230* 236* 270* 223* 186*       Signed:  Skarlette Lattner S  [facility] 01/19/2012, 1:46 PM

## 2012-01-19 NOTE — Progress Notes (Signed)
Physical Therapy Treatment Patient Details Name: [name] MRN: [id number] DOB: 09-21-1940 Today's Date: 01/19/2012 Time: 5638-7564 PT Time Calculation (min): 24 min  PT Assessment / Plan / Recommendation Comments on Treatment Session  Pt progressing well and plans to D/C to SNF for ST Rehab.    Follow Up Recommendations  SNF     Does the patient have the potential to tolerate intense rehabilitation     Barriers to Discharge        Equipment Recommendations  None recommended by PT    Recommendations for Other Services    Frequency Min 3X/week   Plan Discharge plan remains appropriate    Precautions / Restrictions Precautions Precautions: Fall Restrictions Weight Bearing Restrictions: No   Pertinent Vitals/Pain No c/o pain    Mobility  Bed Mobility Bed Mobility: Not assessed Details for Bed Mobility Assistance: Pt OOB in recliner Transfers Transfers: Sit to Stand;Stand to Sit Sit to Stand: 5: Supervision;4: Min guard;From chair/3-in-1 Stand to Sit: 5: Supervision;4: Min guard Details for Transfer Assistance: <25% VC's on safety with turns and hand placement esp with stand to sit. Ambulation/Gait Ambulation/Gait Assistance: 4: Min guard Ambulation Distance (Feet): 160 Feet (80'x2) Assistive device: Rolling walker Ambulation/Gait Assistance Details: <25% Vc's on proper walker to self distance and safety with turns Gait Pattern: Step-to pattern;Step-through pattern;Trunk flexed Gait velocity: decreased     PT Goals                                                       progressing    Visit Information  Last PT Received On: 01/19/12 Assistance Needed: +1    Subjective Data      Cognition       Balance     End of Session PT - End of Session Equipment Utilized During Treatment: Gait belt Activity Tolerance: Patient tolerated treatment well Patient left: in chair;with call bell/phone within reach   Felecia Shelling  PTA [facility]  Acute  Rehab Pager      [phone]

## 2012-01-19 NOTE — Progress Notes (Signed)
[facility] Progress Note  SUBJECTIVE: Hungry.  OBJECTIVE:  Vital signs in last 24 hours: Temp:  [97.4 F (36.3 C)-98.3 F (36.8 C)] 98 F (36.7 C) (01/02 0623) Pulse Rate:  [78-88] 84  (01/02 0623) Resp:  [20] 20  (01/02 0623) BP: (138-152)/(69-77) 138/69 mmHg (01/02 0623) SpO2:  [93 %-96 %] 96 % (01/02 0623) Last BM Date: 01/18/12 General:    white male in NAD Abdomen:  Soft, obese, mild diffuse lower tenderness. Normal bowel sounds. Neurologic:  Alert and oriented,  grossly normal neurologically. Psych:  Cooperative. Normal mood and affect.   Lab Results:  Basename 01/19/12 0448 01/18/12 0844 01/17/12 0451  WBC 8.0 6.6 9.2  HGB 13.6 12.8* 13.1  HCT 38.6* 38.3* 37.5*  PLT 230 209 212   BMET  Basename 01/19/12 0448 01/18/12 0459 01/17/12 0451  NA 135 136 138  K 3.1* 3.6 3.2*  CL 106 110 111  CO2 17* 15* 17*  GLUCOSE 219* 246* 167*  BUN 7 9 11   CREATININE 0.85 0.81 0.82  CALCIUM 9.1 9.0 8.2*   Studies/Results: US Abdomen Complete  01/19/2012  *RADIOLOGY REPORT*  Clinical Data:  Abdominal pain.  Nausea and vomiting.  Distended gallbladder seen on recent CT peri  ABDOMINAL ULTRASOUND COMPLETE  Comparison:  CT on 01/18/2012  Findings: Technically difficult scan due to large patient habitus.  Gallbladder:  No gallstones, gallbladder wall thickening, or pericholecystic fluid.  Common Bile Duct:  Within normal limits in caliber. Measures 5 mm in diameter.  Liver: Diffusely increased parenchymal echogenicity, consistent with hepatic steatosis.  No focal liver mass identified.  IVC:  Appears normal.  Pancreas:  No abnormality identified.  Spleen:  Within normal limits in size and echotexture.  Right kidney:  Normal in size and parenchymal echogenicity.  No evidence of mass or hydronephrosis.  Left kidney:  Normal in size and parenchymal echogenicity.  No evidence of mass or hydronephrosis.  Abdominal Aorta:  No aneurysm identified.  IMPRESSION:  1.  No evidence of  gallstones, biliary dilatation, or other acute findings. 2.  Hepatic steatosis.   Original Report Authenticated By: Myles Rosenthal, M.D.    Ct Abdomen Pelvis W Contrast  01/18/2012  *RADIOLOGY REPORT*  Clinical Data: Severe rectal bleeding.  Nausea, vomiting, and diarrhea.  Status post sigmoidoscopy.  History of ulcerative colitis.  Rule out small bowel obstruction.  CT ABDOMEN AND PELVIS WITH CONTRAST  Technique:  Multidetector CT imaging of the abdomen and pelvis was performed following the standard protocol during bolus administration of intravenous contrast.  Contrast: OMNIPAQUE IOHEXOL 300 MG/ML  SOLN  Comparison: None.  Findings: Lung bases:  Mild motion degradation at the lung bases. Right base subsegmental atelectasis.  Cardiomegaly with prior median sternotomy.  Lipomatous hypertrophy of the interatrial septum.  Small right greater than left bilateral pleural effusions.  Abdomen/pelvis:  Moderate hepatic steatosis.  Hepatomegaly, 18.9 cm cranial caudal. No focal liver lesion.  Normal spleen, stomach. Descending duodenal diverticulum.  Normal pancreas.  The gallbladder is distended, at 12.4 cm.  No calcified stones.  Cannot exclude minimal pericholecystic edema.  No biliary ductal dilatation.  Normal adrenal glands with too small to characterize lesions bilateral kidneys.  Aortic and branch vessel atherosclerosis which is advanced.  Small retroperitoneal nodes, without adenopathy.  Wall thickening involves the rectum, including on image 79/series 2.  Minimal surrounding edema with small nodes in the mesorectum.  Scattered diverticula throughout remainder of the colon.  Motion degradation continuing into the abdomen. Normal terminal ileum and  appendix.  Small bowel loops are normal in caliber, without evidence of small bowel obstruction. No pneumatosis or free intraperitoneal air.  No ascites.  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  1.9 cm on the right and 1.7 cm on the left. No pelvic  adenopathy.  There is mild edema adjacent to and wall thickening of the left side of the urinary bladder, including image 78/series 2. No significant free fluid.  No acute osseous abnormality.  Bones/Musculoskeletal:  No acute osseous abnormality.  IMPRESSION:  1.  No evidence of bowel obstruction. 2.  Rectal wall thickening which is most consistent with proctitis. Presumably related to ulcerative colitis. 3.  Hepatomegaly and hepatic steatosis. 4.  Gallbladder distention.  Cannot exclude minimal adjacent pericholecystic edema.  Correlate with right upper quadrant symptoms and consider ultrasound. 5.  Bilateral common iliac artery aneurysms. 6.  Suspicion of mild left sided bladder wall thickening and adjacent edema.  This could represent cystitis or a component of bladder outlet obstruction. 7.  Motion degradation. 8.  Small bilateral pleural effusions with mild right base atelectasis.   Original Report Authenticated By: Jeronimo Greaves, M.D.      ASSESSMENT / PLAN:  1. Proctitis, moderately severe. Biopsies pending. CTscan with contrast biopsy results. CTscan with contrast c/w proctitis, no TI involvement, no bowel obstruction. Hematochezia resolved. Until outpatient follow up appointment 01/30/12 patient should continue Canasa suppositories Qam, Cortenemas QHS, and Lialda 1.2 gms TID.    2. Steatosis, hepatomegaly. Probably fatty liver disease. LFTs ar normal, no evidence for cholestasis.     LOS: 4 days   Willette Cluster  01/19/2012, 11:40 AM I have reviewed the above note, examined the patient and agree with plan of treatment.May be discharged today. No diarrhea since admission, no vomiting. Will go to Rehab. Continue Cort enema qhs x 2 weeks then 2x/week Continue Canasa supp 1000mg  q am Mesalamine ( Lialda 1.2 gm) 3 po qd. Follow up un the office  [facility] Pager # [phone]

## 2012-01-19 NOTE — Progress Notes (Signed)
Report called to RN at [facility].

## 2012-01-21 LAB — CULTURE, BLOOD (ROUTINE X 2): Culture: NO GROWTH

## 2012-01-30 ENCOUNTER — Encounter: Payer: Self-pay | Admitting: *Deleted

## 2012-01-30 ENCOUNTER — Ambulatory Visit (INDEPENDENT_AMBULATORY_CARE_PROVIDER_SITE_OTHER): Payer: PRIVATE HEALTH INSURANCE | Admitting: Physician Assistant

## 2012-01-30 VITALS — BP 102/80 | HR 76 | Ht 68.0 in | Wt 228.0 lb

## 2012-01-30 DIAGNOSIS — K5120 Ulcerative (chronic) proctitis without complications: Secondary | ICD-10-CM

## 2012-01-30 NOTE — Patient Instructions (Addendum)
Stay on Lialda 1.2, take 2 tablets twice daily. Follow up with Dr. Juanda Chance in 1 year or as needed.

## 2012-01-31 ENCOUNTER — Encounter: Payer: Self-pay | Admitting: Physician Assistant

## 2012-01-31 NOTE — Progress Notes (Signed)
Subjective:    Patient ID: [name], male    DOB: [birth year], 72 y.o.   MRN: [id number]  HPI [name] is a very nice 72 year old white male known to Dr. Lina Sar who had a recent hospital admission 01/15/2012 with symptoms of a gastroenteritis with 2 day history of nausea vomiting and diarrhea. This was followed by onset of rectal bleeding area In retrospect patient had been seen smaller amounts of blood for a couple of weeks prior to that which then worsened. Patient was found to have pyelonephritis, was treated with vancomycin and then also had GI workup during his admission or diarrhea and rectal bleeding. He was found on flexible sigmoidoscopy to have acute moderately to severe proctitis to 20 cm. The remainder of the colon appeared normal. Biopsies were taken and showed a chronic active colitis from 0-20 cm;the remaining  colon showed normal mucosa. He was treated with Lialda  ,Cortenemas and Canasa suppositories . He was discharged to an extended care facility for rehabilitation/[facility] and comes back in today for followup. Patient states he has not been receiving the Canasa suppositories or the cortenemas but nevertheless is feeling much better. He says he has not had any rectal bleeding over the past week and in fact has been having very normal formed stools with one bowel movement per day. He denies any abdominal pain or cramping. He is in good spirits and says his appetite is good as well On further questioning and review of the orders, it sounds like the suppositories and enemas were ordered but the patient declined them because he is been feeling well. He does have a very remote history of ulcerative colitis which had been quite acid for many years    Review of Systems  HENT: Negative.   Eyes: Negative.   Respiratory: Negative.   Cardiovascular: Negative.   Gastrointestinal: Negative.   Genitourinary: Negative.   Musculoskeletal: Positive for gait problem.  Neurological:  Positive for weakness.  Hematological: Negative.   Psychiatric/Behavioral: Negative.    Outpatient Encounter Prescriptions as of 01/30/2012  Medication Sig Dispense Refill  . allopurinol (ZYLOPRIM) 300 MG tablet Take 1 tablet (300 mg total) by mouth daily.  30 tablet  11  . aspirin 81 MG tablet Take 81 mg by mouth daily.        . carvedilol (COREG) 6.25 MG tablet Take 6.25 mg by mouth 2 (two) times daily with a meal.      . citalopram (CELEXA) 20 MG tablet Take 20 mg by mouth every morning.      [name] Kitchen doxycycline (VIBRAMYCIN) 100 MG capsule Take 100 mg by mouth 2 (two) times daily.      . fenofibrate 160 MG tablet Take 1 tablet (160 mg total) by mouth daily.  90 tablet  3  . finasteride (PROSCAR) 5 MG tablet Take 5 mg by mouth every morning.      [name] Kitchen glimepiride (AMARYL) 4 MG tablet Take 1 tablet (4 mg total) by mouth daily before breakfast.  90 tablet  3  . HYDROcodone-acetaminophen (LORTAB) 10-500 MG per tablet Take 1 tablet by mouth every 6 (six) hours as needed. pain  30 tablet  0  . hydrocortisone (CORTENEMA) 100 MG/60ML enema Place 1 enema (100 mg total) rectally at bedtime.  60 mL  3  . insulin glargine (LANTUS) 100 UNIT/ML injection Inject 50-60 Units into the skin 2 (two) times daily. Takes 60 units in the morning and 50 units at night      .  loperamide (IMODIUM) 2 MG capsule Take 1 capsule (2 mg total) by mouth as needed for diarrhea or loose stools.  30 capsule  0  . mesalamine (CANASA) 1000 MG suppository Place 1 suppository (1,000 mg total) rectally every morning.  30 suppository  2  . mesalamine (LIALDA) 1.2 G EC tablet Take 1 tablet (1.2 g total) by mouth 3 (three) times daily.  90 tablet  2  . simvastatin (ZOCOR) 40 MG tablet Take 40 mg by mouth at bedtime.        . solifenacin (VESICARE) 10 MG tablet Take 10 mg by mouth daily.      . Tamsulosin HCl (FLOMAX) 0.4 MG CAPS Take 0.4 mg by mouth daily after supper.       . topiramate (TOPAMAX) 200 MG tablet Take 200 mg by mouth 2 (two)  times daily.        . potassium chloride SA (K-DUR,KLOR-CON) 20 MEQ tablet Take 1 tablet (20 mEq total) by mouth 2 (two) times daily.  10 tablet  0   Allergies  Allergen Reactions  . Dexedrine (Dextroamphetamine Sulfate Er)     agitation  . Oxycodone     nausea   Patient Active Problem List  Diagnosis  . DM  . OBESITY  . TOBACCO ABUSE  . OBSTRUCTIVE SLEEP APNEA  . HYPERTENSION  . AMI, INFERIOR WALL  . CAD, ARTERY BYPASS GRAFT  . 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 SINUS TACHYCARDIA  . Chronic ulcerative proctitis  . Other abnormality of urination  . CAD (coronary artery disease)  . Hyperlipidemia  . Edema  . Ischemic cardiomyopathy  . Preventative health care  . BPH (benign prostatic hypertrophy)  . Left ventricular systolic dysfunction  . Gout  . Bipolar affective disorder  . TIA (transient ischemic attack)  . Chronic pain  . DNR (do not resuscitate)  . CVA (cerebral infarction)  . Narcolepsy  . History of alcohol abuse  . Peripheral neuropathy  . Urinary incontinence  . Pyelonephritis  . Gastroenteritis  . Ulcerative (chronic) proctosigmoiditis  . Diarrhea   History  Substance Use Topics  . Smoking status: Former Smoker    Quit date: 01/18/2008  . Smokeless tobacco: Never Used  . Alcohol Use: No       Objective:   Physical Exam well-developed obese older white male in no acute distress, accompanied by his wife. Blood pressure 102/80 pulse 76 height 5 foot 8 weight 228. ENT; nontraumatic normocephalic EOMI PERRLA sclera anicteric,Neck; Supple no JVD, Cardiovascular; regular rate and rhythm with S1-S2 no murmur or gallop he does have a midline incisional scar, Pulmonary; clear bilaterally,  Abdomen; obese soft, nontender nondistended no palpable mass or hepatosplenomegaly bowel sounds are active, Rectal; exam not done, Extremities; no clubbing cyanosis or edema skin warm and dry, Psych; mood and affect normal and appropriate          Assessment & Plan:  #83  72 year old white male with history of ulcerative colitis in remission for many years now with active proctitis. Symptomatically he is much improved on Lialda.  Plan; Will plan to continue Lialda 1.2 g-2 tablets by mouth twice a day as maintenance therapy. Stop Cortenemas and Rowasa suppositories or now as he is not using them anyway and will reserve for exacerbation of symptoms. Followup with Dr. Lina Sar in one year or sooner when necessary

## 2012-01-31 NOTE — Progress Notes (Signed)
Reviewed, I would lean toward him using a maintenance Hydrocortisone supp 25 mh, use 2x/week. He has chronic low grade pproctitis

## 2012-02-10 ENCOUNTER — Other Ambulatory Visit: Payer: Self-pay

## 2012-02-10 MED ORDER — TOPIRAMATE 200 MG PO TABS: 200.0000 mg | ORAL_TABLET | Freq: Two times a day (BID) | ORAL | Status: DC

## 2012-02-15 ENCOUNTER — Other Ambulatory Visit: Payer: Self-pay

## 2012-02-15 ENCOUNTER — Other Ambulatory Visit: Payer: Self-pay | Admitting: *Deleted

## 2012-02-15 MED ORDER — SIMVASTATIN 40 MG PO TABS: 40.0000 mg | ORAL_TABLET | Freq: Every day | ORAL | Status: DC

## 2012-02-15 MED ORDER — MESALAMINE 1.2 G PO TBEC: 1.2000 g | DELAYED_RELEASE_TABLET | Freq: Three times a day (TID) | ORAL | Status: DC

## 2012-02-15 MED ORDER — POTASSIUM CHLORIDE CRYS ER 20 MEQ PO TBCR: 40.0000 meq | EXTENDED_RELEASE_TABLET | Freq: Two times a day (BID) | ORAL | Status: DC

## 2012-02-15 NOTE — Telephone Encounter (Signed)
Pt needs refills of Potassium and Lialda sent to [facility] pharmacy, rx sent, pt's wife informed.

## 2012-02-20 ENCOUNTER — Encounter (HOSPITAL_COMMUNITY): Payer: Self-pay | Admitting: Dentistry

## 2012-02-29 ENCOUNTER — Telehealth: Payer: Self-pay

## 2012-02-29 ENCOUNTER — Encounter (HOSPITAL_COMMUNITY): Payer: Self-pay | Admitting: Dentistry

## 2012-02-29 MED ORDER — "INSULIN SYRINGE 30G X 1/2"" 0.5 ML MISC": Status: DC

## 2012-02-29 NOTE — Telephone Encounter (Signed)
refill 

## 2012-03-03 ENCOUNTER — Other Ambulatory Visit: Payer: Self-pay

## 2012-03-05 ENCOUNTER — Ambulatory Visit (HOSPITAL_COMMUNITY): Payer: Self-pay | Admitting: Dentistry

## 2012-03-05 ENCOUNTER — Encounter (HOSPITAL_COMMUNITY): Payer: Self-pay | Admitting: Dentistry

## 2012-03-05 VITALS — BP 159/92 | HR 67 | Temp 99.2°F

## 2012-03-05 DIAGNOSIS — J392 Other diseases of pharynx: Secondary | ICD-10-CM

## 2012-03-05 DIAGNOSIS — K08109 Complete loss of teeth, unspecified cause, unspecified class: Secondary | ICD-10-CM

## 2012-03-05 DIAGNOSIS — K0820 Unspecified atrophy of edentulous alveolar ridge: Secondary | ICD-10-CM

## 2012-03-05 DIAGNOSIS — Z449 Encounter for fitting and adjustment of unspecified external prosthetic device: Secondary | ICD-10-CM

## 2012-03-05 NOTE — Patient Instructions (Signed)
Return to clinic for continued denture fabrication as scheduled. 

## 2012-03-05 NOTE — Progress Notes (Signed)
03/05/2012  Patient:            [name] Date of Birth:  [birth year] MRN:                [id number]  BP 159/92  Pulse 67  Temp(Src) 99.2 F (37.3 C) (Oral)  [name] presents for continued upper and lower complete denture fabrication. Patient had been previously hospitalized for severe colitis and underwent rehabilitation to help with his deconditioning. Patient now presents for continued denture fabrication of upper lower complete dentures.  Procedure: Upper and lower border molding and final impressions in [place]. Difficult secondary to poor patient cooperation and gag reflex. Patient tolerated procedure well however. To [employer] for custom baseplates with rims. Return to clinic for upper and lower complete denture jaw relations.  Charlynne Pander, DDS

## 2012-03-07 ENCOUNTER — Encounter: Payer: Self-pay | Admitting: Internal Medicine

## 2012-03-07 ENCOUNTER — Ambulatory Visit (INDEPENDENT_AMBULATORY_CARE_PROVIDER_SITE_OTHER): Payer: PRIVATE HEALTH INSURANCE | Admitting: Internal Medicine

## 2012-03-07 VITALS — BP 102/62 | HR 73 | Temp 97.1°F | Ht 68.5 in | Wt 229.5 lb

## 2012-03-07 DIAGNOSIS — I10 Essential (primary) hypertension: Secondary | ICD-10-CM

## 2012-03-07 DIAGNOSIS — R5383 Other fatigue: Secondary | ICD-10-CM

## 2012-03-07 DIAGNOSIS — R1031 Right lower quadrant pain: Secondary | ICD-10-CM

## 2012-03-07 DIAGNOSIS — E119 Type 2 diabetes mellitus without complications: Secondary | ICD-10-CM

## 2012-03-07 DIAGNOSIS — R531 Weakness: Secondary | ICD-10-CM

## 2012-03-07 MED ORDER — HYDROCODONE-ACETAMINOPHEN 10-325 MG PO TABS: 1.0000 | ORAL_TABLET | Freq: Four times a day (QID) | ORAL | Status: DC | PRN

## 2012-03-07 NOTE — Progress Notes (Signed)
  Subjective:    Patient ID: [name], male    DOB: 01-14-1941, 72 y.o.   MRN: [id number]  HPI  Here with mild to mod intermittent right groin area pain, sometimes severe where it is very difficult to even flex the hip a small amount while sitting. Walks with cane either hand, did have an episode of feet tangled up last night but did not fall in the kitchen.  No radiation. Lying down to "strectch out" seems to help, but not especially worse to stand or walk.  No swelling, lumps, fever. Not worse to lie on right side at night. No new GU or GI problems today.  Has known right iliac anuerysm 2 cm. Hydrocodone at 10 mg seems to help.  Pt denies chest pain, increased sob or doe, wheezing, orthopnea, PND, increased LE swelling, palpitations, dizziness or syncope, despite BP on lower side today. Wt overall stable from nov 2013.  Was hospd with pyelonephritis dec 2013, has f/U with Dr Manning/urology planned.  Did have PT at [facility] rehab after his hospn, home now since jan 22, lives with wife. No recent lower back pain. Has gen'd weakness and requires assist to get up on exam table today Review of Systems  Constitutional: Negative for unexpected weight change, or unusual diaphoresis  HENT: Negative for tinnitus.   Eyes: Negative for photophobia and visual disturbance.  Respiratory: Negative for choking and stridor.   Gastrointestinal: Negative for vomiting and blood in stool.  Genitourinary: Negative for hematuria and decreased urine volume.  Musculoskeletal: Negative for acute joint swelling Skin: Negative for color change and wound.  Neurological: Negative for tremors and numbness other than noted  Psychiatric/Behavioral: Negative for decreased concentration or  hyperactivity.       Objective:   Physical Exam BP 102/62  Pulse 73  Temp(Src) 97.1 F (36.2 C) (Oral)  Ht 5' 8.5" (1.74 m)  Wt 229 lb 8 oz (104.101 kg)  BMI 34.38 kg/m2  SpO2 94% VS noted,  Constitutional: Pt appears  well-developed and well-nourished.  HENT: Head: NCAT.  Right Ear: External ear normal.  Left Ear: External ear normal.  Eyes: Conjunctivae and EOM are normal. Pupils are equal, round, and reactive to light.  Neck: Normal range of motion. Neck supple.  Cardiovascular: Normal rate and regular rhythm.   Pulmonary/Chest: Effort normal and breath sounds normal.  Abd:  Soft, NT, non-distended, + BS GU: no hernia., d/c, scrotal tender Neurological: Pt is alert. Not confused  Skin: Skin is warm. No erythema.  Psychiatric: Pt behavior is normal. Thought content normal.  While lying, passive ROM of right hip elicits no pain    Assessment & Plan:

## 2012-03-07 NOTE — Patient Instructions (Addendum)
Please continue all other medications as before, and refills have been done if requested. Please be more active with doing your Physical Therapy excercises, as if you get weaker, you will fall and possible have to go back to the hospital Your pain medication is refilled today Thank you for enrolling in MyChart. Please follow the instructions below to securely access your online medical record. MyChart allows you to send messages to your doctor, view your test results, renew your prescriptions, schedule appointments, and more. To Log into MyChart, please go to [email or web address], and your Username is: [email or web address] Please return in 3 months, or sooner if needed

## 2012-03-07 NOTE — Assessment & Plan Note (Signed)
Recent a1c much improved at 7.9 in dec 2013, cont current med, diet and wt loss efforts

## 2012-03-07 NOTE — Assessment & Plan Note (Signed)
Exam benign, ok for pain med for prob soft tissue issue, hold on further labs or imaging at this time

## 2012-03-07 NOTE — Assessment & Plan Note (Signed)
stable overall by history and exam, recent data reviewed with pt, and pt to continue medical treatment as before,  to f/u any worsening symptoms or concerns BP Readings from Last 3 Encounters:  03/07/12 102/62  03/05/12 159/92  01/30/12 102/80

## 2012-03-07 NOTE — Assessment & Plan Note (Signed)
Has not been doing his home PT exercises, needs to start doing this

## 2012-03-14 ENCOUNTER — Ambulatory Visit (HOSPITAL_COMMUNITY): Payer: Self-pay | Admitting: Dentistry

## 2012-03-14 ENCOUNTER — Encounter (HOSPITAL_COMMUNITY): Payer: Self-pay | Admitting: Dentistry

## 2012-03-14 VITALS — BP 132/84 | HR 74 | Temp 98.9°F

## 2012-03-14 DIAGNOSIS — Z449 Encounter for fitting and adjustment of unspecified external prosthetic device: Secondary | ICD-10-CM

## 2012-03-14 DIAGNOSIS — K0820 Unspecified atrophy of edentulous alveolar ridge: Secondary | ICD-10-CM

## 2012-03-14 NOTE — Progress Notes (Signed)
03/14/2012  Patient:            [name] Date of Birth:  1940-04-30 MRN:                [id number]  BP 132/84  Pulse 74  Temp(Src) 98.9 F (37.2 C) (Oral)  [name] presents for continued denture fabrication. Procedure:  Upper and lower denture Jaw relations with aluwax bite registration. Patient agrees to tooth selection of 21X, P, 10 degree posteriors to match with Portrait A2 shade. Patient tolerated procedure well. RTC for denture wax try in.   Charlynne Pander, DDS

## 2012-03-14 NOTE — Patient Instructions (Signed)
Return to clinic as scheduled for continued upper and lower complete denture fabrication. Dr. Javan Gonzaga 

## 2012-03-22 ENCOUNTER — Encounter (HOSPITAL_COMMUNITY): Payer: Self-pay | Admitting: Dentistry

## 2012-03-22 ENCOUNTER — Ambulatory Visit (HOSPITAL_COMMUNITY): Payer: Self-pay | Admitting: Dentistry

## 2012-03-22 VITALS — BP 149/81 | HR 71 | Temp 98.9°F

## 2012-03-22 DIAGNOSIS — Z449 Encounter for fitting and adjustment of unspecified external prosthetic device: Secondary | ICD-10-CM

## 2012-03-22 NOTE — Patient Instructions (Signed)
Return to clinic for continued denture fabrication as scheduled. Charlynne Pander, DDS

## 2012-03-22 NOTE — Progress Notes (Signed)
03/22/2012  Patient:            [name] Date of Birth:  05-Sep-1940 MRN:                [id number]  BP 149/81  Pulse 71  Temp(Src) 98.9 F (37.2 C) (Oral)  Reuel Boom [name] presents for continued upper and lower denture fabrication. Procedure:  Upper and lower denture wax tryin. Posterior crossbite was not necessary, however, a class II malocclusion in bite registration was noted. Lower premolar was removed from mandibular set up and explained to the patient. Patient accepts esthetics, phonetics, fit and function. Patient agrees to process "as is" in [place] [address]. Patient to RTC for  upper and lower denture insertion.  Charlynne Pander, DDS

## 2012-04-04 ENCOUNTER — Encounter (HOSPITAL_COMMUNITY): Payer: Self-pay | Admitting: Dentistry

## 2012-04-09 ENCOUNTER — Encounter (HOSPITAL_COMMUNITY): Payer: Self-pay | Admitting: Dentistry

## 2012-04-09 ENCOUNTER — Ambulatory Visit (HOSPITAL_COMMUNITY): Payer: Self-pay | Admitting: Dentistry

## 2012-04-09 VITALS — BP 123/83 | HR 76 | Temp 98.4°F

## 2012-04-09 DIAGNOSIS — K0820 Unspecified atrophy of edentulous alveolar ridge: Secondary | ICD-10-CM

## 2012-04-09 DIAGNOSIS — K08109 Complete loss of teeth, unspecified cause, unspecified class: Secondary | ICD-10-CM

## 2012-04-09 DIAGNOSIS — Z463 Encounter for fitting and adjustment of dental prosthetic device: Secondary | ICD-10-CM

## 2012-04-09 DIAGNOSIS — Z449 Encounter for fitting and adjustment of unspecified external prosthetic device: Secondary | ICD-10-CM

## 2012-04-09 NOTE — Progress Notes (Signed)
04/09/2012  Patient:            [name] Date of Birth:  [birth year] MRN:                [id number]  BP 123/83  Pulse 76  Temp(Src) 98.4 F (36.9 C) (Oral)  [name] presents for insertion of upper and lower complete dentures. Procedure: Pressure indicating paste applied to dentures. Adjustments made as needed. [place]. Occlusion evaluated and adjustments made as needed for Centric Relation and protrusive strokes. Patient with tendency to protrude to end to end position. Patient provided instructions on finding centric relation/maximum intercuspation position for chewing. Good esthetics, phonetics, fit, and function noted. Patient accepts results. Post op instructions provided in written and verbal formats on use and care of dentures. Gave patient denture brush and cup. Patient to keep dentures out if sore spots develop. Use salt water rinses as needed to aid healing. Return to clinic as scheduled for denture adjustment.  Call if problems arise before then. Patient dismissed in stable condition.   Charlynne Pander, DDS

## 2012-04-09 NOTE — Patient Instructions (Signed)
Instructions for Denture Use and Care  Congratulations, you are on the way to oral rehabilitation!  You have just received a new set of complete or partial dentures.  These prostheses will help to improve both your appearance and chewing ability.  These instructions will help you get adjusted to your dentures as well as care for them properly.  Please read these instructions carefully and completely as soon as you get home.  If you or your caregiver have any questions please notify the [facility] at [phone].  HOW YOUR DENTURES LOOK AND FEEL Soon after you begin wearing your dentures, you may feel that your dentures are too large or even loose.  As our mouth and facial muscles become accustomed to the dentures, these feelings will go away.  You also may feel that you are salivating more than you normally do.  This feeling should go away as you get used to having the dentures in your mouth.  You may bite your cheek or your tongue; this will eventually resolve itself as you wear your dentures.  Some soreness is to be expected, but you should not hurt.  If your mouth hurts, call your dentist.  A denture adhesive may occasionally be necessary to hold your dentures in place more securely.  The dentist will let you know when one is recommended for you.  SPEAKING Wearing dentures will change the sound of your voice initially.  This will be noticed by you more than anyone else.  Bite and swallow before you speak, in order to place your dentures in position so that you may speak more clearly.  Practice speaking by reading aloud or counting from 1 to 100 very slowly and distinctly.  After some practice your mouth will become accustomed to your dentures and you will speak more clearly.  EATING Chewing will definitely be different after you receive your dentures.  With a little practice and patience you should be able to eat just about any kind of food.  Begin by eating small quantities of food  that are cut into small pieces.  Star with soft foods such as eggs, cooked vegetables, or puddings.  As you gain confidence advance  Your diet to whatever texture foods you can tolerate.  DENTURE CARE Dentures can collect plaque and calculus much the same as natural teeth can.  If not removed on a regular basis, your dentures will not look or feel clean, and you will experience denture odor.  It is very important that you remove your dentures at bedtime and clean them thoroughly.  You should: 1. Clean your dentures over a sink full of water so if dropped, breakage will be prevented. 2. Rinse your dentures with cool water to remove any large food particles. 3. Use soap and water or a denture cleanser or paste to clean the dentures.  Do not use regular toothpaste as it may abrade the denture base or teeth. 4. Use a moistened denture brush to clean all surfaces (inside and outside). 5. Rinse thoroughly to remove any remaining soap or denture cleanser. 6. Use a soft bristle toothbrush to gently brush any natural teeth, gums, tongue, and palate at bedtime and before reinserting your dentures. 7. Do not sleep with your dentures in your mouth at night.  Remove your dentures and soak them overnight in a denture cup filled with water or denture solution as recommended by your dentist.  This routine will become second nature and will increase the life and comfort   of your dentures.  Please do not try to adjust these dentures yourself; you could damage them.  FOLLOW-UP You should call or make an appointment with your dentist.  Your dentist would like to see you at least once a year for a check-up and examination. 

## 2012-04-16 ENCOUNTER — Ambulatory Visit (HOSPITAL_COMMUNITY): Payer: Self-pay | Admitting: Dentistry

## 2012-04-16 ENCOUNTER — Encounter (HOSPITAL_COMMUNITY): Payer: Self-pay | Admitting: Dentistry

## 2012-04-16 VITALS — BP 141/94 | HR 81 | Temp 98.6°F

## 2012-04-16 DIAGNOSIS — Z449 Encounter for fitting and adjustment of unspecified external prosthetic device: Secondary | ICD-10-CM

## 2012-04-16 DIAGNOSIS — Z972 Presence of dental prosthetic device (complete) (partial): Secondary | ICD-10-CM

## 2012-04-16 DIAGNOSIS — K062 Gingival and edentulous alveolar ridge lesions associated with trauma: Secondary | ICD-10-CM

## 2012-04-16 NOTE — Progress Notes (Signed)
04/16/2012  Patient:            [name] Date of Birth:  [birth year] MRN:                [id number]  BP 141/94  Pulse 81  Temp(Src) 98.6 F (37 C) (Oral)  Kristof Nadeem [name] presents for evaluation of recently inserted upper and lower complete dentures. SUBJECTIVE: Patient is complaining of lower left and lower right denture irritation. OBJECTIVE: There is no sign of denture ulceration. There is some slight erythema to the lower left and lower right buccal extensions. Procedure: Pressure indicating paste applied to dentures. Adjustments made as needed. [place]. Occlusion evaluated and adjustments made as needed for Centric Relation and protrusive strokes. Patient accepts results. Patient to keep dentures out if sore spots remain. Use salt water rinses as needed to aid healing. Return to clinic as scheduled for denture adjustment.  Call if problems arise before then. Patient dismissed in stable condition.   Charlynne Pander, DDS

## 2012-04-16 NOTE — Patient Instructions (Signed)
Patient to keep dentures out if sore spots remain. Use salt water rinses as needed to aid healing. Return to clinic as scheduled for denture adjustment.  Call if problems arise before then. Dr. Kristin Bruins

## 2012-04-19 ENCOUNTER — Encounter: Payer: Self-pay | Admitting: Internal Medicine

## 2012-04-19 ENCOUNTER — Ambulatory Visit (INDEPENDENT_AMBULATORY_CARE_PROVIDER_SITE_OTHER): Payer: PRIVATE HEALTH INSURANCE | Admitting: Internal Medicine

## 2012-04-19 VITALS — BP 120/72 | HR 80 | Temp 97.0°F | Ht 68.0 in | Wt 234.2 lb

## 2012-04-19 DIAGNOSIS — E119 Type 2 diabetes mellitus without complications: Secondary | ICD-10-CM

## 2012-04-19 DIAGNOSIS — E785 Hyperlipidemia, unspecified: Secondary | ICD-10-CM

## 2012-04-19 DIAGNOSIS — I10 Essential (primary) hypertension: Secondary | ICD-10-CM

## 2012-04-19 DIAGNOSIS — G4700 Insomnia, unspecified: Secondary | ICD-10-CM

## 2012-04-19 HISTORY — DX: Insomnia, unspecified: G47.00

## 2012-04-19 MED ORDER — CLONAZEPAM 1 MG PO TABS: 1.0000 mg | ORAL_TABLET | Freq: Two times a day (BID) | ORAL | Status: DC | PRN

## 2012-04-19 NOTE — Assessment & Plan Note (Signed)
For ambien prn, hopefully to get more sleep at night to help re-start better daytime med compliance

## 2012-04-19 NOTE — Assessment & Plan Note (Signed)
stable overall by history and exam, recent data reviewed with pt, and pt to continue medical treatment as before,  to f/u any worsening symptoms or concerns Lab Results  Component Value Date   [facility]  Value: 24        Total Cholesterol/HDL:CHD Risk Coronary Heart Disease Risk Table                     Men   Women  1/2 Average Risk   3.4   3.3  Average Risk       5.0   4.4  2 X Average Risk   9.6   7.1  3 X Average Risk  23.4   11.0        Use the calculated Patient Ratio above and the CHD Risk Table to determine the patient's CHD Risk.        ATP III CLASSIFICATION (LDL):  <100     mg/dL   Optimal  161-096  mg/dL   Near or Above                    Optimal  130-159  mg/dL   Borderline  045-409  mg/dL   High  >811     mg/dL   Very High 10/01/7827

## 2012-04-19 NOTE — Assessment & Plan Note (Signed)
Control unclear but likely less than optimal with recent missed med dosing, to re-start insulin as prescribed, cont diet, wt loss efforts

## 2012-04-19 NOTE — Assessment & Plan Note (Signed)
stable overall by history and exam, recent data reviewed with pt, and pt to continue medical treatment as before,  to f/u any worsening symptoms or concerns BP Readings from Last 3 Encounters:  04/19/12 120/72  04/16/12 141/94  04/09/12 123/83

## 2012-04-19 NOTE — Patient Instructions (Addendum)
Please take all new medication as prescribed - the klonopin for sleep at bedtime Please continue all other medications as before, with all medications at the right time every day Please keep your appointments with your specialists as you have planned Thank you for enrolling in MyChart. Please follow the instructions below to securely access your online medical record. MyChart allows you to send messages to your doctor, view your test results, renew your prescriptions, schedule appointments, and more. To Log into My Chart online, please go by [employer] or [employer] to [email or web address], or download the MyChart App from the [employer] .  Your Username is: [name] (pass savanna) Please return in 3 months, after taking your medication more regularly, to check the sugar again

## 2012-04-19 NOTE — Progress Notes (Signed)
Subjective:    Patient ID: [name], male    DOB: 1940-05-29, 72 y.o.   MRN: [id number]  HPI  Here with wife, pt admits to not sleeping as well recently, often staying up late, then sleeping in late to noon the next day, often missing AM insulin doseing and sometimes oral meds as well. Pt denies chest pain, increased sob or doe, wheezing, orthopnea, PND, increased LE swelling, palpitations, dizziness or syncope.  Pt denies polydipsia, polyuria, or low sugar symptoms such as weakness or confusion improved with po intake.  Pt denies new neurological symptoms such as new headache, or facial or extremity weakness or numbness, has not really been been trying to follow lower cholesterol, diabetic diet, but wt overall stable,  but little exercise however.  Pt denies fever, wt loss, night sweats, loss of appetite, or other constitutional symptoms Past Medical History  Diagnosis Date  . CAD (coronary artery disease)     s/p 4V CABG 10/15/08; EF is 50%  . Obesity   . BPH (benign prostatic hypertrophy)   . Edema   . Left ventricular systolic dysfunction     EF is 40% per echo December 2012  . AMI, INFERIOR WALL 09/30/2008    Qualifier: Diagnosis of  By: Sherral Hammers, RN, BSN, Melanie    . CAD, ARTERY BYPASS GRAFT 11/18/2008    Qualifier: Diagnosis of  By: Clifton James, MD, Cristal Deer    . 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 UNIVERSAL ULCERATIVE COLITIS 07/31/2007    Qualifier: Diagnosis of  By: Arlyce Dice MD, Barbette Hair   . Ischemic cardiomyopathy 05/05/2011  . DM 09/01/2008    Qualifier: Diagnosis of  By: Kem Parkinson    . OBSTRUCTIVE SLEEP APNEA 09/16/2008    Qualifier: Diagnosis of  By: Shelle Iron MD, Maree Krabbe   . HYPERTENSION 09/01/2008    Qualifier:  Diagnosis of  By: Kem Parkinson    . Hyperlipidemia 05/18/2010  . Gout 09/23/2011  . Bipolar affective disorder 09/23/2011  . TIA (transient ischemic attack) 09/23/2011  . Chronic pain 09/23/2011  . DNR (do not resuscitate) 09/23/2011    Per July 2011 [place] admission  . CVA (cerebral infarction) 09/23/2011    Small left occipital  . 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 History of alcohol abuse 09/23/2011  . Blood in stool   . Depression   . Urine incontinence   . Stroke     "mini strokes"  . Incontinence of urine   . Neuromuscular disorder     Diabetic neuropathy  . [facility] spotted fever   . Benign neoplasm of colon 03/2011    Adenomatous polyps   Past Surgical History  Procedure Laterality Date  . Coronary artery bypass graft      Median sternotomy, extracorporeal circulation,  coronary artery bypass graft surgery x4 using a sequential left internal   mammary artery graft to the mid and distal left anterior descending, a   saphenous vein graft to diagonal branch of the left anterior descending,   and a saphenous vein graft to the obtuse marginal branch of left   circumflex coronary artery.    . Cardiac catheterization      Triple-vessel coronary artery disease. Low-normal left ventricular systolic function with mild   anteroapical wall motion abnormality.   [name] Kitchen  Tonsilectomy, adenoidectomy, bilateral myringotomy and tubes  1946  . Multiple extractions with alveoloplasty  12/01/2011    Procedure: MULTIPLE EXTRACION WITH ALVEOLOPLASTY;  Surgeon: Charlynne Pander, DDS;  Location: [facility] OR;  Service: Oral Surgery;  Laterality: N/A;  Extraction of tooth #'s 3,4,5,6,7,8,9,10,11,12,13,14,15,16,17,20,21,22,23,24,25,26,27,28,29,30,31,32 with alveoloplasty and bilateral mandibular lingual tori  . Flexible sigmoidoscopy  01/17/2012    Procedure: FLEXIBLE SIGMOIDOSCOPY;  Surgeon: Hart Carwin, MD;  Location: [facility];  Service: Endoscopy;  Laterality: N/A;    reports that he quit smoking about 4 years  ago. He has never used smokeless tobacco. He reports that he does not drink alcohol or use illicit drugs. family history includes Alcohol abuse in his others; Aortic aneurysm in his mother; Arthritis in his others; Diabetes in his father and others; Heart attack in his mother; Heart disease in his father and others; Hyperlipidemia in his other; Hypertension in his other; Mental illness in his others; and Stroke in his other. Allergies  Allergen Reactions  . Dexedrine (Dextroamphetamine Sulfate Er)     agitation  . Oxycodone     nausea   Current Outpatient Prescriptions on File Prior to Visit  Medication Sig Dispense Refill  . allopurinol (ZYLOPRIM) 300 MG tablet Take 1 tablet (300 mg total) by mouth daily.  30 tablet  11  . aspirin 81 MG tablet Take 81 mg by mouth daily.        . carvedilol (COREG) 6.25 MG tablet Take 6.25 mg by mouth 2 (two) times daily with a meal.      . citalopram (CELEXA) 20 MG tablet Take 20 mg by mouth every morning.      . fenofibrate 160 MG tablet Take 1 tablet (160 mg total) by mouth daily.  90 tablet  3  . finasteride (PROSCAR) 5 MG tablet Take 5 mg by mouth every morning.      [name] Kitchen glimepiride (AMARYL) 4 MG tablet Take 1 tablet (4 mg total) by mouth daily before breakfast.  90 tablet  3  . HYDROcodone-acetaminophen (NORCO) 10-325 MG per tablet Take 1 tablet by mouth every 6 (six) hours as needed for pain.  120 tablet  2  . insulin glargine (LANTUS) 100 UNIT/ML injection Inject 50-60 Units into the skin 2 (two) times daily. Takes 60 units in the morning and 50 units at night      . Insulin Syringe-Needle U-100 (INSULIN SYRINGE .5CC/30GX1/2") 30G X 1/2" 0.5 ML MISC Use as directed for insulin.  Diagnosis code 250.00  100 each  11  . loperamide (IMODIUM) 2 MG capsule Take 1 capsule (2 mg total) by mouth as needed for diarrhea or loose stools.  30 capsule  0  . mesalamine (LIALDA) 1.2 G EC tablet Take 1 tablet (1.2 g total) by mouth 3 (three) times daily.  90 tablet  3  .  simvastatin (ZOCOR) 40 MG tablet Take 1 tablet (40 mg total) by mouth at bedtime.  30 tablet  11  . solifenacin (VESICARE) 10 MG tablet Take 10 mg by mouth daily.      . Tamsulosin HCl (FLOMAX) 0.4 MG CAPS Take 0.4 mg by mouth daily after supper.       . topiramate (TOPAMAX) 200 MG tablet Take 1 tablet (200 mg total) by mouth 2 (two) times daily.  60 tablet  6  . potassium chloride SA (K-DUR,KLOR-CON) 20 MEQ tablet Take 2 tablets (40 mEq total) by mouth 2 (two) times daily.  120 tablet  3   No current facility-administered  medications on file prior to visit.   Review of Systems  Constitutional: Negative for unexpected weight change, or unusual diaphoresis  HENT: Negative for tinnitus.   Eyes: Negative for photophobia and visual disturbance.  Respiratory: Negative for choking and stridor.   Gastrointestinal: Negative for vomiting and blood in stool.  Genitourinary: Negative for hematuria and decreased urine volume.  Musculoskeletal: Negative for acute joint swelling Skin: Negative for color change and wound.  Neurological: Negative for tremors and numbness Psychiatric/Behavioral: Negative for decreased concentration or  hyperactivity.       Objective:   Physical Exam BP 120/72  Pulse 80  Temp(Src) 97 F (36.1 C) (Oral)  Ht 5\' 8"  (1.727 m)  Wt 234 lb 4 oz (106.255 kg)  BMI 35.63 kg/m2  SpO2 93% VS noted,  Constitutional: Pt appears well-developed and well-nourished/obese.  HENT: Head: NCAT.  Right Ear: External ear normal.  Left Ear: External ear normal.  Eyes: Conjunctivae and EOM are normal. Pupils are equal, round, and reactive to light.  Neck: Normal range of motion. Neck supple.  Cardiovascular: Normal rate and regular rhythm.   Pulmonary/Chest: Effort normal and breath sounds normal.  Abd:  Soft, NT, non-distended, + BS Neurological: Pt is alert. Not confused  Skin: Skin is warm. No erythema. trace edema bilat Psychiatric: Pt behavior is normal. Thought content  normal.1+ nervous     Assessment & Plan:

## 2012-05-04 ENCOUNTER — Emergency Department (HOSPITAL_COMMUNITY)
Admission: EM | Admit: 2012-05-04 | Discharge: 2012-05-04 | Disposition: A | Discharge status: Home / Self Care | Payer: PRIVATE HEALTH INSURANCE | Attending: Emergency Medicine | Admitting: Emergency Medicine

## 2012-05-04 ENCOUNTER — Other Ambulatory Visit: Payer: Self-pay | Admitting: Internal Medicine

## 2012-05-04 DIAGNOSIS — R300 Dysuria: Secondary | ICD-10-CM | POA: Insufficient documentation

## 2012-05-04 DIAGNOSIS — Z8679 Personal history of other diseases of the circulatory system: Secondary | ICD-10-CM | POA: Insufficient documentation

## 2012-05-04 DIAGNOSIS — Z7982 Long term (current) use of aspirin: Secondary | ICD-10-CM | POA: Insufficient documentation

## 2012-05-04 DIAGNOSIS — Z860100 Personal history of colon polyps, unspecified: Secondary | ICD-10-CM | POA: Insufficient documentation

## 2012-05-04 DIAGNOSIS — E1169 Type 2 diabetes mellitus with other specified complication: Secondary | ICD-10-CM | POA: Insufficient documentation

## 2012-05-04 DIAGNOSIS — N390 Urinary tract infection, site not specified: Secondary | ICD-10-CM

## 2012-05-04 DIAGNOSIS — E669 Obesity, unspecified: Secondary | ICD-10-CM | POA: Insufficient documentation

## 2012-05-04 DIAGNOSIS — R350 Frequency of micturition: Secondary | ICD-10-CM | POA: Insufficient documentation

## 2012-05-04 DIAGNOSIS — Z79899 Other long term (current) drug therapy: Secondary | ICD-10-CM | POA: Insufficient documentation

## 2012-05-04 DIAGNOSIS — F319 Bipolar disorder, unspecified: Secondary | ICD-10-CM | POA: Insufficient documentation

## 2012-05-04 DIAGNOSIS — R739 Hyperglycemia, unspecified: Secondary | ICD-10-CM

## 2012-05-04 DIAGNOSIS — R21 Rash and other nonspecific skin eruption: Secondary | ICD-10-CM | POA: Insufficient documentation

## 2012-05-04 DIAGNOSIS — Z794 Long term (current) use of insulin: Secondary | ICD-10-CM | POA: Insufficient documentation

## 2012-05-04 DIAGNOSIS — I252 Old myocardial infarction: Secondary | ICD-10-CM | POA: Insufficient documentation

## 2012-05-04 DIAGNOSIS — I10 Essential (primary) hypertension: Secondary | ICD-10-CM | POA: Insufficient documentation

## 2012-05-04 DIAGNOSIS — Z8669 Personal history of other diseases of the nervous system and sense organs: Secondary | ICD-10-CM | POA: Insufficient documentation

## 2012-05-04 DIAGNOSIS — I2510 Atherosclerotic heart disease of native coronary artery without angina pectoris: Secondary | ICD-10-CM | POA: Insufficient documentation

## 2012-05-04 DIAGNOSIS — Z8673 Personal history of transient ischemic attack (TIA), and cerebral infarction without residual deficits: Secondary | ICD-10-CM | POA: Insufficient documentation

## 2012-05-04 DIAGNOSIS — E785 Hyperlipidemia, unspecified: Secondary | ICD-10-CM | POA: Insufficient documentation

## 2012-05-04 DIAGNOSIS — B372 Candidiasis of skin and nail: Secondary | ICD-10-CM | POA: Insufficient documentation

## 2012-05-04 DIAGNOSIS — G8929 Other chronic pain: Secondary | ICD-10-CM | POA: Insufficient documentation

## 2012-05-04 DIAGNOSIS — Z8619 Personal history of other infectious and parasitic diseases: Secondary | ICD-10-CM | POA: Insufficient documentation

## 2012-05-04 DIAGNOSIS — N400 Enlarged prostate without lower urinary tract symptoms: Secondary | ICD-10-CM | POA: Insufficient documentation

## 2012-05-04 DIAGNOSIS — Z87891 Personal history of nicotine dependence: Secondary | ICD-10-CM | POA: Insufficient documentation

## 2012-05-04 DIAGNOSIS — M109 Gout, unspecified: Secondary | ICD-10-CM | POA: Insufficient documentation

## 2012-05-04 DIAGNOSIS — B379 Candidiasis, unspecified: Secondary | ICD-10-CM

## 2012-05-04 DIAGNOSIS — Z8719 Personal history of other diseases of the digestive system: Secondary | ICD-10-CM | POA: Insufficient documentation

## 2012-05-04 DIAGNOSIS — Z951 Presence of aortocoronary bypass graft: Secondary | ICD-10-CM | POA: Insufficient documentation

## 2012-05-04 LAB — URINALYSIS, ROUTINE W REFLEX MICROSCOPIC
Bilirubin Urine: NEGATIVE
Protein, ur: 30 mg/dL — AB
Urobilinogen, UA: 0.2 mg/dL (ref 0.0–1.0)

## 2012-05-04 LAB — URINE MICROSCOPIC-ADD ON

## 2012-05-04 MED ORDER — CEPHALEXIN 500 MG PO CAPS: 500.0000 mg | ORAL_CAPSULE | Freq: Four times a day (QID) | ORAL | Status: DC

## 2012-05-04 MED ORDER — NYSTATIN 100000 UNIT/GM EX CREA: TOPICAL_CREAM | CUTANEOUS | Status: DC

## 2012-05-04 MED ORDER — CEPHALEXIN 250 MG PO CAPS
500.0000 mg | ORAL_CAPSULE | Freq: Once | ORAL | Status: AC
  Administered 2012-05-04: 500 mg via ORAL
  Filled 2012-05-04: qty 2

## 2012-05-04 NOTE — ED Notes (Signed)
Pt st's he check his blood sugar this am and it read high over 600.  After insulin only came down to 563.  Pt also st's he has felt short of breath today.

## 2012-05-04 NOTE — Telephone Encounter (Signed)
Called the patients wife informed of next available refill date.

## 2012-05-04 NOTE — Telephone Encounter (Signed)
Too soon on the hydrocodone refills, should be ok to about may 17

## 2012-05-04 NOTE — ED Provider Notes (Signed)
History     CSN: [id number]  Arrival date & time 05/04/12  1548   First MD Initiated Contact with Patient 05/04/12 1708      Chief Complaint  Patient presents with  . Hyperglycemia    (Consider location/radiation/quality/duration/timing/severity/associated sxs/prior treatment) HPI Comments: 72 year old male with history of diabetes who presents with hyperglycemia. The patient states that his blood sugar was unmeasurable he high according to his home monitor, this was treated with Lantus insulin at home which has gradually improved his blood sugar but because it was so high then decided to come get evaluated. The patient denies any chest pain or shortness of breath, denies blurred vision or headache and denies generalized weakness. He has been having dysuria for the last 3-4 days which is persistent every time he urinates he has pain at the beginning of urination and then is able to finish urinating. He has also had frequent urination.  Blood sugar elevation is persistent, gradually improving, associated with chest pain fevers nausea or vomiting  The history is provided by the patient and the spouse.    Past Medical History  Diagnosis Date  . CAD (coronary artery disease)     s/p 4V CABG 10/15/08; EF is 50%  . Obesity   . BPH (benign prostatic hypertrophy)   . Edema   . Left ventricular systolic dysfunction     EF is 40% per echo December 2012  . AMI, INFERIOR WALL 09/30/2008    Qualifier: Diagnosis of  By: Sherral Hammers, RN, BSN, Melanie    . CAD, ARTERY BYPASS GRAFT 11/18/2008    Qualifier: Diagnosis of  By: Clifton James, MD, Cristal Deer    . 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 UNIVERSAL ULCERATIVE COLITIS 07/31/2007    Qualifier: Diagnosis  of  By: Arlyce Dice MD, Barbette Hair   . Ischemic cardiomyopathy 05/05/2011  . DM 09/01/2008    Qualifier: Diagnosis of  By: Kem Parkinson    . OBSTRUCTIVE SLEEP APNEA 09/16/2008    Qualifier: Diagnosis of  By: Shelle Iron MD, Maree Krabbe   . HYPERTENSION 09/01/2008    Qualifier: Diagnosis of  By: Kem Parkinson    . Hyperlipidemia 05/18/2010  . Gout 09/23/2011  . Bipolar affective disorder 09/23/2011  . TIA (transient ischemic attack) 09/23/2011  . Chronic pain 09/23/2011  . DNR (do not resuscitate) 09/23/2011    Per July 2011 [place] admission  . CVA (cerebral infarction) 09/23/2011    Small left occipital  . 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 History of alcohol abuse 09/23/2011  . Blood in stool   . Depression   . Urine incontinence   . Stroke     "mini strokes"  . Incontinence of urine   . Neuromuscular disorder     Diabetic neuropathy  . [facility] spotted fever   . Benign neoplasm of colon 03/2011    Adenomatous polyps    Past Surgical History  Procedure Laterality Date  . Coronary artery bypass graft      Median sternotomy, extracorporeal circulation,  coronary artery bypass graft surgery x4 using a sequential left internal   mammary artery graft to the mid and distal left anterior descending, a   saphenous vein graft to diagonal branch of the left anterior descending,   and a saphenous vein graft  to the obtuse marginal branch of left   circumflex coronary artery.    . Cardiac catheterization      Triple-vessel coronary artery disease. Low-normal left ventricular systolic function with mild   anteroapical wall motion abnormality.   . Tonsilectomy, adenoidectomy, bilateral myringotomy and tubes  1946  . Multiple extractions with alveoloplasty  12/01/2011    Procedure: MULTIPLE EXTRACION WITH ALVEOLOPLASTY;  Surgeon: Charlynne Pander, DDS;  Location: [facility] OR;  Service: Oral Surgery;  Laterality: N/A;  Extraction of tooth #'s 3,4,5,6,7,8,9,10,11,12,13,14,15,16,17,20,21,22,23,24,25,26,27,28,29,30,31,32 with  alveoloplasty and bilateral mandibular lingual tori  . Flexible sigmoidoscopy  01/17/2012    Procedure: FLEXIBLE SIGMOIDOSCOPY;  Surgeon: Hart Carwin, MD;  Location: [facility];  Service: Endoscopy;  Laterality: N/A;    Family History  Problem Relation Age of Onset  . Diabetes Father   . Heart disease Father   . Heart attack Mother   . Aortic aneurysm Mother   . Alcohol abuse Other   . Arthritis Other   . Hyperlipidemia Other   . Heart disease Other   . Stroke Other   . Hypertension Other   . Diabetes Other   . Mental illness Other   . Alcohol abuse Other   . Arthritis Other   . Heart disease Other   . Mental illness Other   . Diabetes Other     History  Substance Use Topics  . Smoking status: Former Smoker    Quit date: 01/18/2008  . Smokeless tobacco: Never Used  . Alcohol Use: No      Review of Systems  All other systems reviewed and are negative.    Allergies  Dexedrine and Oxycodone  Home Medications   Current Outpatient Rx  Name  Route  Sig  Dispense  Refill  . allopurinol (ZYLOPRIM) 300 MG tablet   Oral   Take 1 tablet (300 mg total) by mouth daily.   30 tablet   11   . aspirin 81 MG tablet   Oral   Take 81 mg by mouth daily.           . carvedilol (COREG) 6.25 MG tablet   Oral   Take 6.25 mg by mouth 2 (two) times daily with a meal.         . citalopram (CELEXA) 20 MG tablet   Oral   Take 20 mg by mouth every morning.         . clonazePAM (KLONOPIN) 1 MG tablet   Oral   Take 1 mg by mouth 2 (two) times daily as needed for anxiety.         . fenofibrate 160 MG tablet   Oral   Take 1 tablet (160 mg total) by mouth daily.   90 tablet   3   . finasteride (PROSCAR) 5 MG tablet   Oral   Take 5 mg by mouth every morning.         [name] Kitchen glimepiride (AMARYL) 4 MG tablet   Oral   Take 1 tablet (4 mg total) by mouth daily before breakfast.   90 tablet   3   . HYDROcodone-acetaminophen (NORCO) 10-325 MG per tablet   Oral    Take 1 tablet by mouth every 6 (six) hours as needed for pain.         [name] Kitchen insulin glargine (LANTUS) 100 UNIT/ML injection   Subcutaneous   Inject 50-60 Units into the skin 2 (two) times daily. Takes 60 units in the morning and 50 units at  night         . loperamide (IMODIUM) 2 MG capsule   Oral   Take 2 mg by mouth as needed for diarrhea or loose stools.         . mesalamine (LIALDA) 1.2 G EC tablet   Oral   Take 1.2 g by mouth 3 (three) times daily.         . potassium chloride SA (K-DUR,KLOR-CON) 20 MEQ tablet   Oral   Take 40 mEq by mouth 2 (two) times daily.         . simvastatin (ZOCOR) 40 MG tablet   Oral   Take 40 mg by mouth at bedtime.         . solifenacin (VESICARE) 10 MG tablet   Oral   Take 10 mg by mouth daily.         . Tamsulosin HCl (FLOMAX) 0.4 MG CAPS   Oral   Take 0.4 mg by mouth daily after supper.          . topiramate (TOPAMAX) 200 MG tablet   Oral   Take 200 mg by mouth 2 (two) times daily.         . cephALEXin (KEFLEX) 500 MG capsule   Oral   Take 1 capsule (500 mg total) by mouth 4 (four) times daily.   28 capsule   0   . nystatin cream (MYCOSTATIN)      Apply to affected area 2 times daily   30 g   0     BP 144/80  Pulse 81  Temp(Src) 97.7 F (36.5 C) (Oral)  Resp 20  SpO2 94%  Physical Exam  Nursing note and vitals reviewed. Constitutional: He appears well-developed and well-nourished. No distress.  HENT:  Head: Normocephalic and atraumatic.  Mouth/Throat: Oropharynx is clear and moist. No oropharyngeal exudate.  Eyes: Conjunctivae and EOM are normal. Pupils are equal, round, and reactive to light. Right eye exhibits no discharge. Left eye exhibits no discharge. No scleral icterus.  Neck: Normal range of motion. Neck supple. No JVD present. No thyromegaly present.  Cardiovascular: Normal rate, regular rhythm, normal heart sounds and intact distal pulses.  Exam reveals no gallop and no friction rub.   No  murmur heard. Pulmonary/Chest: Effort normal and breath sounds normal. No respiratory distress. He has no wheezes. He has no rales.  Abdominal: Soft. Bowel sounds are normal. He exhibits no distension and no mass. There is no tenderness.  Genitourinary:  Perineum has erythematous skin, very moist, satellite lesions, no tenderness or open wounds. The normal-appearing penis and scrotum  Musculoskeletal: Normal range of motion. He exhibits no edema and no tenderness.  Lymphadenopathy:    He has no cervical adenopathy.  Neurological: He is alert. Coordination normal.  Skin: Skin is warm and dry. Rash ( 2 perineum) noted. No erythema.  Psychiatric: He has a normal mood and affect. His behavior is normal.    ED Course  Procedures (including critical care time)  Labs Reviewed  URINALYSIS, ROUTINE W REFLEX MICROSCOPIC - Abnormal; Notable for the following:    APPearance CLOUDY (*)    Glucose, UA >1000 (*)    Hgb urine dipstick SMALL (*)    Protein, ur 30 (*)    Leukocytes, UA LARGE (*)    All other components within normal limits  GLUCOSE, CAPILLARY - Abnormal; Notable for the following:    Glucose-Capillary 281 (*)    All other components within normal limits  URINE MICROSCOPIC-ADD ON -  Abnormal; Notable for the following:    Bacteria, UA FEW (*)    All other components within normal limits  URINE CULTURE   No results found.   1. Hyperglycemia   2. UTI (lower urinary tract infection)   3. Yeast infection       MDM  We did check a urinalysis to rule out urinary infection as source of hyperglycemia, patient has a yeast infection of the skin which will require topical applications, blood sugar has improved to less than 300 spontaneously after home medications, no indication for further insulin treatment at this time. Otherwise the patient appears well, vital signs are unremarkable and he does not appear in any acute distress.  Urinalysis reveals urinary tract infection with too  numerous to count white blood cells and the presence of bacteria. Keflex given, nystatin cream for yeast infection on skin, vital signs remain unremarkable the patient is stable for discharge   Discussed findings with patient and family member, agreeable to treatment plan, stable for discharge  Meds given in ED:  Medications  cephALEXin (KEFLEX) capsule 500 mg (not administered)    New Prescriptions   CEPHALEXIN (KEFLEX) 500 MG CAPSULE    Take 1 capsule (500 mg total) by mouth 4 (four) times daily.   NYSTATIN CREAM (MYCOSTATIN)    Apply to affected area 2 times daily      Vida Roller, MD 05/04/12 [phone]

## 2012-05-06 LAB — URINE CULTURE

## 2012-05-07 ENCOUNTER — Telehealth: Payer: Self-pay | Admitting: Internal Medicine

## 2012-05-07 ENCOUNTER — Ambulatory Visit (HOSPITAL_COMMUNITY): Payer: Self-pay | Admitting: Dentistry

## 2012-05-07 ENCOUNTER — Encounter (HOSPITAL_COMMUNITY): Payer: Self-pay | Admitting: Dentistry

## 2012-05-07 VITALS — BP 146/85 | HR 75 | Temp 98.9°F

## 2012-05-07 DIAGNOSIS — Z449 Encounter for fitting and adjustment of unspecified external prosthetic device: Secondary | ICD-10-CM

## 2012-05-07 DIAGNOSIS — K08109 Complete loss of teeth, unspecified cause, unspecified class: Secondary | ICD-10-CM

## 2012-05-07 DIAGNOSIS — K0820 Unspecified atrophy of edentulous alveolar ridge: Secondary | ICD-10-CM

## 2012-05-07 DIAGNOSIS — Z972 Presence of dental prosthetic device (complete) (partial): Secondary | ICD-10-CM

## 2012-05-07 LAB — GLUCOSE, CAPILLARY: Glucose-Capillary: 254 mg/dL — ABNORMAL HIGH (ref 70–99)

## 2012-05-07 NOTE — Patient Instructions (Signed)
Patient to keep dentures out if sore spots arise. Use salt water rinses as needed to aid healing. Return to clinic as scheduled for denture adjustment.  Call if problems arise before then. Dr. Kristin Bruins

## 2012-05-07 NOTE — Telephone Encounter (Signed)
[facility] Triage Call Report Triage Record Num: [id number] Operator: Laren Boom Patient Name: Landan Fedie Call Date & Time: 05/04/2012 2:26:55PM Patient Phone: [phone] PCP: Oliver Barre Patient Gender: Male PCP Fax : [phone] Patient DOB: 1940-03-14 Practice Name: [facility] Reason for Call: Caller: [name]/Spouse; PCP: Oliver Barre (Adults only); CB#: [phone]. 05/04/12 - Blood Sugar was taken at 12 pm (lunch) and it was 597 (after morning medication @ 11:30 am). It was taken again at 2:30 pm and it was 566. Patient states that he does not feel well. Afebrile. Emergent Sign/Symptom of "signs/symptoms of ketoacidosis AND blood sugar more than 300 mg/dl" per Diabetes: Control Problems Protocol (disposition-see in emergency room immediately). Wife Advised to take Patient to the ER for Evaluation. Wife Agreed - will take him to [facility]. Care Advice Given. Protocol(s) Used: Diabetes: Control Problems Recommended Outcome per Protocol: See ED Immediately Reason for Outcome: Signs and symptoms of ketoacidosis AND blood sugar more than 300 mg/dl Care Advice: ~ Another adult should drive. Dehydration can affect blood sugar levels. Drink water during transport and while waiting to see a provider. If vomiting, take sips of water or suck on ice chips. ~Write down provider's name. List or place the following in a bag for transport with the patient: current prescription and/or nonprescription medications; alternative treatments, therapies and medications; and street drugs.

## 2012-05-07 NOTE — Progress Notes (Signed)
05/07/2012  Patient:            [name] Date of Birth:  09/23/1940 MRN:                [id number]  BP 146/85  Pulse 75  Temp(Src) 98.9 F (37.2 C) (Oral)  [name] presents for evaluation of recently inserted upper and lower complete dentures. SUBJECTIVE: Patient is not complaining of any denture irritation. Patient is having difficulty in using the lower denture.   OBJECTIVE: There is no sign of denture ulceration or erythema noted. Lower denture with plaque accumulations and oral hygiene of the dentures was discussed thoroughly.  Procedure: Pressure indicating paste applied to dentures. Adjustments made as needed. [place]. Occlusion evaluated and adjustments made as needed for Centric Relation and protrusive strokes. Patient accepts results. Patient to keep dentures out if sore spots arise. Use salt water rinses as needed to aid healing. Return to clinic as scheduled for denture adjustment.  Call if problems arise before then. Patient dismissed in stable condition.   Charlynne Pander, DDS

## 2012-05-09 ENCOUNTER — Encounter: Payer: Self-pay | Admitting: Internal Medicine

## 2012-05-09 ENCOUNTER — Ambulatory Visit (INDEPENDENT_AMBULATORY_CARE_PROVIDER_SITE_OTHER): Payer: PRIVATE HEALTH INSURANCE | Admitting: Internal Medicine

## 2012-05-09 VITALS — BP 112/70 | HR 69 | Temp 97.0°F | Ht 68.5 in | Wt 237.1 lb

## 2012-05-09 DIAGNOSIS — I10 Essential (primary) hypertension: Secondary | ICD-10-CM

## 2012-05-09 DIAGNOSIS — G8929 Other chronic pain: Secondary | ICD-10-CM

## 2012-05-09 DIAGNOSIS — L304 Erythema intertrigo: Secondary | ICD-10-CM

## 2012-05-09 DIAGNOSIS — L538 Other specified erythematous conditions: Secondary | ICD-10-CM

## 2012-05-09 DIAGNOSIS — E119 Type 2 diabetes mellitus without complications: Secondary | ICD-10-CM

## 2012-05-09 DIAGNOSIS — N12 Tubulo-interstitial nephritis, not specified as acute or chronic: Secondary | ICD-10-CM

## 2012-05-09 MED ORDER — NYSTATIN 100000 UNIT/GM EX POWD: CUTANEOUS | Status: DC

## 2012-05-09 MED ORDER — HYDROCODONE-ACETAMINOPHEN 10-325 MG PO TABS: 1.0000 | ORAL_TABLET | Freq: Four times a day (QID) | ORAL | Status: DC | PRN

## 2012-05-09 NOTE — Progress Notes (Signed)
Subjective:    Patient ID: [name], male    DOB: 12-05-1940, 72 y.o.   MRN: [id number]  HPI  Here to f/u recent hospn with UTI finishing antibx, and Denies urinary symptoms such as dysuria, frequency, urgency, flank pain, hematuria or n/v, fever, chills.  Sugars have been elevated during hospn, at home wife reports often 300 or more, still on lantus 60 am/50pm.  Pt denies chest pain, increased sob or doe, wheezing, orthopnea, PND, increased LE swelling, palpitations, dizziness or syncope.   Pt denies polydipsia, polyuria, or low sugar symptoms such as weakness or confusion improved with po intake.  Pt states overall good compliance with meds post hospn.  Overall pain controlled, for refill today.  Has some ongoing intertrigo not well improving with antifungal cream.   Pt denies fever, wt loss, night sweats, loss of appetite, or other constitutional symptoms Past Medical History  Diagnosis Date  . CAD (coronary artery disease)     s/p 4V CABG 10/15/08; EF is 50%  . Obesity   . BPH (benign prostatic hypertrophy)   . Edema   . Left ventricular systolic dysfunction     EF is 40% per echo December 2012  . AMI, INFERIOR WALL 09/30/2008    Qualifier: Diagnosis of  By: Sherral Hammers, RN, BSN, Melanie    . CAD, ARTERY BYPASS GRAFT 11/18/2008    Qualifier: Diagnosis of  By: Clifton James, MD, Cristal Deer    . 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 UNIVERSAL ULCERATIVE COLITIS 07/31/2007    Qualifier: Diagnosis of  By: Arlyce Dice MD, Barbette Hair   . Ischemic cardiomyopathy 05/05/2011  . DM 09/01/2008    Qualifier: Diagnosis of  By: Kem Parkinson    . OBSTRUCTIVE SLEEP APNEA 09/16/2008    Qualifier: Diagnosis of  By: Shelle Iron MD, Maree Krabbe   . HYPERTENSION 09/01/2008     Qualifier: Diagnosis of  By: Kem Parkinson    . Hyperlipidemia 05/18/2010  . Gout 09/23/2011  . Bipolar affective disorder 09/23/2011  . TIA (transient ischemic attack) 09/23/2011  . Chronic pain 09/23/2011  . DNR (do not resuscitate) 09/23/2011    Per July 2011 [place] admission  . CVA (cerebral infarction) 09/23/2011    Small left occipital  . 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 History of alcohol abuse 09/23/2011  . Blood in stool   . Depression   . Urine incontinence   . Stroke     "mini strokes"  . Incontinence of urine   . Neuromuscular disorder     Diabetic neuropathy  . [facility] spotted fever   . Benign neoplasm of colon 03/2011    Adenomatous polyps   Past Surgical History  Procedure Laterality Date  . Coronary artery bypass graft      Median sternotomy, extracorporeal circulation,  coronary artery bypass graft surgery x4 using a sequential left internal   mammary artery graft to the mid and distal left anterior descending, a   saphenous vein graft to diagonal branch of the left anterior descending,   and a saphenous vein graft to the obtuse marginal branch of left   circumflex coronary artery.    . Cardiac catheterization      Triple-vessel coronary artery disease. Low-normal left ventricular systolic function with mild   anteroapical wall  motion abnormality.   . Tonsilectomy, adenoidectomy, bilateral myringotomy and tubes  1946  . Multiple extractions with alveoloplasty  12/01/2011    Procedure: MULTIPLE EXTRACION WITH ALVEOLOPLASTY;  Surgeon: Charlynne Pander, DDS;  Location: [facility] OR;  Service: Oral Surgery;  Laterality: N/A;  Extraction of tooth #'s 3,4,5,6,7,8,9,10,11,12,13,14,15,16,17,20,21,22,23,24,25,26,27,28,29,30,31,32 with alveoloplasty and bilateral mandibular lingual tori  . Flexible sigmoidoscopy  01/17/2012    Procedure: FLEXIBLE SIGMOIDOSCOPY;  Surgeon: Hart Carwin, MD;  Location: [facility]COPY;  Service: Endoscopy;  Laterality: N/A;    reports that he quit smoking  about 4 years ago. He has never used smokeless tobacco. He reports that he does not drink alcohol or use illicit drugs. family history includes Alcohol abuse in his others; Aortic aneurysm in his mother; Arthritis in his others; Diabetes in his father and others; Heart attack in his mother; Heart disease in his father and others; Hyperlipidemia in his other; Hypertension in his other; Mental illness in his others; and Stroke in his other. Allergies  Allergen Reactions  . Dexedrine (Dextroamphetamine Sulfate Er)     agitation  . Oxycodone     nausea   Current Outpatient Prescriptions on File Prior to Visit  Medication Sig Dispense Refill  . allopurinol (ZYLOPRIM) 300 MG tablet Take 1 tablet (300 mg total) by mouth daily.  30 tablet  11  . aspirin 81 MG tablet Take 81 mg by mouth daily.        . carvedilol (COREG) 6.25 MG tablet Take 6.25 mg by mouth 2 (two) times daily with a meal.      . cephALEXin (KEFLEX) 500 MG capsule Take 1 capsule (500 mg total) by mouth 4 (four) times daily.  28 capsule  0  . citalopram (CELEXA) 20 MG tablet Take 20 mg by mouth every morning.      . clonazePAM (KLONOPIN) 1 MG tablet Take 1 mg by mouth 2 (two) times daily as needed for anxiety.      . fenofibrate 160 MG tablet Take 1 tablet (160 mg total) by mouth daily.  90 tablet  3  . finasteride (PROSCAR) 5 MG tablet Take 5 mg by mouth every morning.      [name] Kitchen glimepiride (AMARYL) 4 MG tablet Take 1 tablet (4 mg total) by mouth daily before breakfast.  90 tablet  3  . insulin glargine (LANTUS) 100 UNIT/ML injection Inject 50-60 Units into the skin 2 (two) times daily. Takes 60 units in the morning and 50 units at night      . loperamide (IMODIUM) 2 MG capsule Take 2 mg by mouth as needed for diarrhea or loose stools.      . mesalamine (LIALDA) 1.2 G EC tablet Take 1.2 g by mouth 3 (three) times daily.      . potassium chloride SA (K-DUR,KLOR-CON) 20 MEQ tablet Take 40 mEq by mouth 2 (two) times daily.      .  simvastatin (ZOCOR) 40 MG tablet Take 40 mg by mouth at bedtime.      . solifenacin (VESICARE) 10 MG tablet Take 10 mg by mouth daily.      . Tamsulosin HCl (FLOMAX) 0.4 MG CAPS Take 0.4 mg by mouth daily after supper.       . topiramate (TOPAMAX) 200 MG tablet Take 200 mg by mouth 2 (two) times daily.       No current facility-administered medications on file prior to visit.   Review of Systems  Constitutional: Negative for unexpected weight change, or unusual diaphoresis  HENT: Negative for tinnitus.   Eyes: Negative for photophobia and visual disturbance.  Respiratory: Negative for choking and stridor.   Gastrointestinal: Negative for vomiting and blood in stool.  Genitourinary: Negative for hematuria and decreased urine volume.  Musculoskeletal: Negative for acute joint swelling Skin: Negative for color change and wound.  Neurological: Negative for tremors and numbness other than noted  Psychiatric/Behavioral: Negative for decreased concentration or  hyperactivity.       Objective:   Physical Exam BP 112/70  Pulse 69  Temp(Src) 97 F (36.1 C) (Oral)  Ht 5' 8.5" (1.74 m)  Wt 237 lb 2 oz (107.559 kg)  BMI 35.53 kg/m2  SpO2 94% VS noted, not ill appearing Constitutional: Pt appears well-developed and well-nourished./obese  HENT: Head: NCAT.  Right Ear: External ear normal.  Left Ear: External ear normal.  Eyes: Conjunctivae and EOM are normal. Pupils are equal, round, and reactive to light.  Neck: Normal range of motion. Neck supple.  Cardiovascular: Normal rate and regular rhythm.   Pulmonary/Chest: Effort normal and breath sounds normal.  Abd:  Soft, NT, non-distended, + BS, no flank tender Neurological: Pt is alert. Not confused  Skin: Skin is warm. Still some red rash noted bilat inguinal areas under pannus Psychiatric: Pt behavior is normal. Thought content normal.     Assessment & Plan:

## 2012-05-09 NOTE — Assessment & Plan Note (Signed)
Stable, for pain med refill,  to f/u any worsening symptoms or concerns  

## 2012-05-09 NOTE — Assessment & Plan Note (Signed)
With mild increase cbg due to illness, ok to increase the lantus to 70am, 60pm for 1 wk, then back to baseline, call for persisent cbg > 200

## 2012-05-09 NOTE — Patient Instructions (Signed)
Ok to increase the lantus to 70 units in the am and 60 units in the PM for 1 week, then resume the prior 60 AM, and 50 PM Please finish your antibiotic Please re-schedule your appointment with urology as you have planned Please continue all other medications as before, and refills have been done if requested - the pain medication Please take all new medication as prescribed - the nystatin powder instead of the cream

## 2012-05-09 NOTE — Assessment & Plan Note (Signed)
To change the cream to nystatin powder asd,  to f/u any worsening symptoms or concerns

## 2012-05-09 NOTE — Assessment & Plan Note (Signed)
stable overall by history and exam, recent data reviewed with pt, and pt to continue medical treatment as before,  to f/u any worsening symptoms or concerns BP Readings from Last 3 Encounters:  05/09/12 112/70  05/07/12 146/85  05/04/12 147/75

## 2012-05-09 NOTE — Assessment & Plan Note (Signed)
Improved, to finish antibx, f/u urology as planned

## 2012-05-24 ENCOUNTER — Telehealth: Payer: Self-pay | Admitting: *Deleted

## 2012-05-24 DIAGNOSIS — R300 Dysuria: Secondary | ICD-10-CM

## 2012-05-24 NOTE — Telephone Encounter (Signed)
Please come at your convenience asap for UA and culture - I will order

## 2012-05-24 NOTE — Telephone Encounter (Signed)
Pt is having UTI sxs again, requesting refill of Keflex.

## 2012-05-25 NOTE — Telephone Encounter (Signed)
Patient's wife informed

## 2012-06-05 ENCOUNTER — Other Ambulatory Visit: Payer: Self-pay | Admitting: Internal Medicine

## 2012-06-05 ENCOUNTER — Ambulatory Visit: Payer: Self-pay | Admitting: Internal Medicine

## 2012-06-05 NOTE — Telephone Encounter (Signed)
Done hardcopy to robin  

## 2012-06-05 NOTE — Telephone Encounter (Signed)
Faxed hardcopy to [facility] [place] 

## 2012-06-07 ENCOUNTER — Emergency Department (HOSPITAL_COMMUNITY): Payer: PRIVATE HEALTH INSURANCE

## 2012-06-07 ENCOUNTER — Telehealth: Payer: Self-pay

## 2012-06-07 ENCOUNTER — Emergency Department (HOSPITAL_COMMUNITY)
Admission: EM | Admit: 2012-06-07 | Discharge: 2012-06-07 | Disposition: A | Discharge status: Home / Self Care | Payer: PRIVATE HEALTH INSURANCE | Attending: Emergency Medicine | Admitting: Emergency Medicine

## 2012-06-07 DIAGNOSIS — Z794 Long term (current) use of insulin: Secondary | ICD-10-CM | POA: Insufficient documentation

## 2012-06-07 DIAGNOSIS — Z7982 Long term (current) use of aspirin: Secondary | ICD-10-CM | POA: Insufficient documentation

## 2012-06-07 DIAGNOSIS — E119 Type 2 diabetes mellitus without complications: Secondary | ICD-10-CM | POA: Insufficient documentation

## 2012-06-07 DIAGNOSIS — I2510 Atherosclerotic heart disease of native coronary artery without angina pectoris: Secondary | ICD-10-CM | POA: Insufficient documentation

## 2012-06-07 DIAGNOSIS — F1010 Alcohol abuse, uncomplicated: Secondary | ICD-10-CM | POA: Insufficient documentation

## 2012-06-07 DIAGNOSIS — F329 Major depressive disorder, single episode, unspecified: Secondary | ICD-10-CM | POA: Insufficient documentation

## 2012-06-07 DIAGNOSIS — R609 Edema, unspecified: Secondary | ICD-10-CM | POA: Insufficient documentation

## 2012-06-07 DIAGNOSIS — I252 Old myocardial infarction: Secondary | ICD-10-CM | POA: Insufficient documentation

## 2012-06-07 DIAGNOSIS — E785 Hyperlipidemia, unspecified: Secondary | ICD-10-CM | POA: Insufficient documentation

## 2012-06-07 DIAGNOSIS — G8929 Other chronic pain: Secondary | ICD-10-CM | POA: Insufficient documentation

## 2012-06-07 DIAGNOSIS — F3289 Other specified depressive episodes: Secondary | ICD-10-CM | POA: Insufficient documentation

## 2012-06-07 DIAGNOSIS — I10 Essential (primary) hypertension: Secondary | ICD-10-CM | POA: Insufficient documentation

## 2012-06-07 DIAGNOSIS — R739 Hyperglycemia, unspecified: Secondary | ICD-10-CM

## 2012-06-07 DIAGNOSIS — Z87891 Personal history of nicotine dependence: Secondary | ICD-10-CM | POA: Insufficient documentation

## 2012-06-07 DIAGNOSIS — Z8719 Personal history of other diseases of the digestive system: Secondary | ICD-10-CM | POA: Insufficient documentation

## 2012-06-07 DIAGNOSIS — Z951 Presence of aortocoronary bypass graft: Secondary | ICD-10-CM | POA: Insufficient documentation

## 2012-06-07 DIAGNOSIS — Z79899 Other long term (current) drug therapy: Secondary | ICD-10-CM | POA: Insufficient documentation

## 2012-06-07 DIAGNOSIS — Z8669 Personal history of other diseases of the nervous system and sense organs: Secondary | ICD-10-CM | POA: Insufficient documentation

## 2012-06-07 DIAGNOSIS — Z8679 Personal history of other diseases of the circulatory system: Secondary | ICD-10-CM | POA: Insufficient documentation

## 2012-06-07 DIAGNOSIS — Z8673 Personal history of transient ischemic attack (TIA), and cerebral infarction without residual deficits: Secondary | ICD-10-CM | POA: Insufficient documentation

## 2012-06-07 DIAGNOSIS — Z8639 Personal history of other endocrine, nutritional and metabolic disease: Secondary | ICD-10-CM | POA: Insufficient documentation

## 2012-06-07 DIAGNOSIS — Z862 Personal history of diseases of the blood and blood-forming organs and certain disorders involving the immune mechanism: Secondary | ICD-10-CM | POA: Insufficient documentation

## 2012-06-07 DIAGNOSIS — Z87448 Personal history of other diseases of urinary system: Secondary | ICD-10-CM | POA: Insufficient documentation

## 2012-06-07 LAB — URINE MICROSCOPIC-ADD ON

## 2012-06-07 LAB — CBC WITH DIFFERENTIAL/PLATELET
Basophils Relative: 0 % (ref 0–1)
Eosinophils Absolute: 0.3 10*3/uL (ref 0.0–0.7)
Eosinophils Relative: 4 % (ref 0–5)
Lymphs Abs: 1.9 10*3/uL (ref 0.7–4.0)
MCH: 32.5 pg (ref 26.0–34.0)
MCHC: 35 g/dL (ref 30.0–36.0)
MCV: 92.8 fL (ref 78.0–100.0)
Neutrophils Relative %: 62 % (ref 43–77)
Platelets: 175 10*3/uL (ref 150–400)

## 2012-06-07 LAB — URINALYSIS, ROUTINE W REFLEX MICROSCOPIC
Bilirubin Urine: NEGATIVE
Hgb urine dipstick: NEGATIVE
Nitrite: NEGATIVE
Protein, ur: NEGATIVE mg/dL
Specific Gravity, Urine: 1.025 (ref 1.005–1.030)
Urobilinogen, UA: 0.2 mg/dL (ref 0.0–1.0)

## 2012-06-07 LAB — BASIC METABOLIC PANEL
BUN: 22 mg/dL (ref 6–23)
Calcium: 9.3 mg/dL (ref 8.4–10.5)
GFR calc Af Amer: >90 mL/min (ref >90)
GFR calc non Af Amer: 83 mL/min — ABNORMAL LOW (ref >90)
Glucose, Bld: 406 mg/dL — ABNORMAL HIGH (ref 70–99)
Potassium: 3.8 mEq/L (ref 3.5–5.1)
Sodium: 135 mEq/L (ref 135–145)

## 2012-06-07 LAB — GLUCOSE, CAPILLARY

## 2012-06-07 LAB — POCT I-STAT TROPONIN I: Troponin i, poc: 0.07 ng/mL (ref 0.00–0.08)

## 2012-06-07 MED ORDER — SODIUM CHLORIDE 0.9 % IV BOLUS (SEPSIS)
1000.0000 mL | Freq: Once | INTRAVENOUS | Status: AC
Start: 2012-06-07 — End: 2012-06-07
  Administered 2012-06-07: 1000 mL via INTRAVENOUS

## 2012-06-07 MED ORDER — SODIUM CHLORIDE 0.9 % IV BOLUS (SEPSIS)
1000.0000 mL | Freq: Once | INTRAVENOUS | Status: AC
  Administered 2012-06-07: 1000 mL via INTRAVENOUS

## 2012-06-07 NOTE — ED Notes (Signed)
Fall bracelet placed on pt.  Pt also placed on 2L [place]

## 2012-06-07 NOTE — ED Notes (Signed)
CBG 289 

## 2012-06-07 NOTE — ED Notes (Signed)
Pt reports hyperglycemia.  Reports that his CBG read over 400.  Pt reports that he has had some trouble ambulating recently and things that he may have a UTI due to dysuria.
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
## 2012-06-07 NOTE — ED Provider Notes (Signed)
See prior note   Ward Givens, MD 06/07/12 2255

## 2012-06-07 NOTE — Telephone Encounter (Signed)
Patient's wife informed

## 2012-06-07 NOTE — ED Notes (Signed)
Pt dc to home.  Pt sts understanding to dc instructions.  Pt taken to car via w/c.

## 2012-06-07 NOTE — Telephone Encounter (Signed)
If unsafe to walk or be helped to the ER, he should go to ER by EMS, ok to call 911 due to elevated BS, possible dehydration, and high risk for fall and injury

## 2012-06-07 NOTE — Telephone Encounter (Signed)
The wife called needing advise on how to get the patient admitted into the hospital to get the patient placed in long term care.  States he is getting too much for her to care for, has a UTI, stumbling a lot and BS uncontrolled at no less than 350 for the past 2 to 3 weeks, is a risk for falling.  Last night was 595.  Please advise on what to do

## 2012-06-08 ENCOUNTER — Telehealth: Payer: Self-pay

## 2012-06-08 DIAGNOSIS — R269 Unspecified abnormalities of gait and mobility: Secondary | ICD-10-CM

## 2012-06-08 NOTE — Telephone Encounter (Signed)
Referral done

## 2012-06-08 NOTE — Telephone Encounter (Signed)
Patients wife informed.  She would like to know if you could refer the patient to rehab.  She stated she will not be able to openly discuss this situation at his appointment on Tuesday as the  Patient is not aware of the need for him to be in rehab.

## 2012-06-08 NOTE — Telephone Encounter (Signed)
Message copied by Pincus Sanes on Fri Jun 08, 2012  1:58 PM ------      Message from: Corwin Levins      Created: Fri Jun 08, 2012  1:05 PM       I think I can put in for a [employer] referral to start the process now, but she would have to understand this can take several wks or more...      ----- Message -----         From: Vladimir Crofts Ewing         Sent: 06/08/2012   9:34 AM           To: Corwin Levins, MD            The patients wife did take the patient to the hospital last night.  They did not keep him.  The patient does have a hospital followup on Tuesday and did request that it not be mentioned in front of the patient regarding him going into a nursing home as he is not aware.       ------

## 2012-06-08 NOTE — Addendum Note (Signed)
Addended by: Corwin Levins on: 06/08/2012 05:29 PM   Modules accepted: Orders

## 2012-06-12 ENCOUNTER — Encounter: Payer: Self-pay | Admitting: Internal Medicine

## 2012-06-12 ENCOUNTER — Ambulatory Visit (INDEPENDENT_AMBULATORY_CARE_PROVIDER_SITE_OTHER): Payer: PRIVATE HEALTH INSURANCE | Admitting: Internal Medicine

## 2012-06-12 VITALS — BP 118/80 | HR 87 | Temp 97.7°F | Wt 241.0 lb

## 2012-06-12 DIAGNOSIS — M545 Low back pain: Secondary | ICD-10-CM

## 2012-06-12 DIAGNOSIS — E119 Type 2 diabetes mellitus without complications: Secondary | ICD-10-CM

## 2012-06-12 DIAGNOSIS — M25551 Pain in right hip: Secondary | ICD-10-CM

## 2012-06-12 DIAGNOSIS — I10 Essential (primary) hypertension: Secondary | ICD-10-CM

## 2012-06-12 DIAGNOSIS — M25559 Pain in unspecified hip: Secondary | ICD-10-CM

## 2012-06-12 NOTE — Assessment & Plan Note (Signed)
For pelvic/hip films - r/o avn, djd, but no recent etoh or steroid use

## 2012-06-12 NOTE — Patient Instructions (Addendum)
Please continue all other medications as before, including the pain medication Please go to the XRAY Department in the Basement (go straight as you get off the elevator) for the x-ray testing Please go to the LAB in the Basement (turn left off the elevator) for the tests to be done today You will be contacted by phone if any changes need to be made immediately.  Otherwise, you will receive a letter about your results with an explanation, but please check with MyChart first.  Thank you for enrolling in MyChart. Please follow the instructions below to securely access your online medical record. MyChart allows you to send messages to your doctor, view your test results, renew your prescriptions, schedule appointments, and more.  Please return in 6 months, or sooner if needed

## 2012-06-12 NOTE — Progress Notes (Signed)
Subjective:    Patient ID: [name], male    DOB: 1940/08/20, 72 y.o.   MRN: [id number]  HPI  Here to f/u with wife, needs FL2 filled out for placement, now with c/o 2-3 mo increased right groin pain with radiation to the lower leg below the knee, has some achy low sacral pain as well but does not seem to be associated temporally.  Denies falls, but very unsteady, unable to get up on exam table due to pain and weakness today, walks with cane.  No fever, hx of hip DJD, but pain about 7/10 in spite of his current pain meds.  Denies urinary symptoms such as dysuria, frequency, urgency, flank pain, hematuria or n/v, fever, chills.Denies worsening reflux, abd pain, dysphagia, n/v, bowel change or blood.  Unfortunately CBG's have been labile recently as well, this am 452 at home, but more often in lower 100's.   Pt denies fever, wt loss, night sweats, loss of appetite. Overall good compliance with treatment, and good medicine tolerability. Pt denies chest pain, increased sob or doe, wheezing, orthopnea, PND, increased LE swelling, palpitations, dizziness or syncope. Pt denies new neurological symptoms such as new headache, or facial or extremity weakness or numbness  Past Medical History  Diagnosis Date  . CAD (coronary artery disease)     s/p 4V CABG 10/15/08; EF is 50%  . Obesity   . BPH (benign prostatic hypertrophy)   . Edema   . Left ventricular systolic dysfunction     EF is 40% per echo December 2012  . AMI, INFERIOR WALL 09/30/2008    Qualifier: Diagnosis of  By: Sherral Hammers, RN, BSN, Melanie    . CAD, ARTERY BYPASS GRAFT 11/18/2008    Qualifier: Diagnosis of  By: Clifton , MD, Cristal Deer    . 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 UNIVERSAL  ULCERATIVE COLITIS 07/31/2007    Qualifier: Diagnosis of  By: Arlyce Dice MD, Barbette Hair   . Ischemic cardiomyopathy 05/05/2011  . DM 09/01/2008    Qualifier: Diagnosis of  By: Kem Parkinson    . OBSTRUCTIVE SLEEP APNEA 09/16/2008    Qualifier: Diagnosis of  By: Shelle Iron MD, Maree Krabbe   . HYPERTENSION 09/01/2008    Qualifier: Diagnosis of  By: Kem Parkinson    . Hyperlipidemia 05/18/2010  . Gout 09/23/2011  . Bipolar affective disorder 09/23/2011  . TIA (transient ischemic attack) 09/23/2011  . Chronic pain 09/23/2011  . DNR (do not resuscitate) 09/23/2011    Per July 2011 [facility] admission  . CVA (cerebral infarction) 09/23/2011    Small left occipital  . 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 History of alcohol abuse 09/23/2011  . Blood in stool   . Depression   . Urine incontinence   . Stroke     "mini strokes"  . Incontinence of urine   . Neuromuscular disorder     Diabetic neuropathy  . [facility] spotted fever   . Benign neoplasm of colon 03/2011    Adenomatous polyps   Past Surgical History  Procedure Laterality Date  . Coronary artery bypass graft      Median sternotomy, extracorporeal circulation,  coronary artery bypass graft surgery x4 using a sequential left internal   mammary artery graft to the mid and distal left anterior  descending, a   saphenous vein graft to diagonal branch of the left anterior descending,   and a saphenous vein graft to the obtuse marginal branch of left   circumflex coronary artery.    . Cardiac catheterization      Triple-vessel coronary artery disease. Low-normal left ventricular systolic function with mild   anteroapical wall motion abnormality.   . Tonsilectomy, adenoidectomy, bilateral myringotomy and tubes  1946  . Multiple extractions with alveoloplasty  12/01/2011    Procedure: MULTIPLE EXTRACION WITH ALVEOLOPLASTY;  Surgeon: Charlynne Pander, DDS;  Location: [facility] OR;  Service: Oral Surgery;  Laterality: N/A;  Extraction of tooth #'s  3,4,5,6,7,8,9,10,11,12,13,14,15,16,17,20,21,22,23,24,25,26,27,28,29,30,31,32 with alveoloplasty and bilateral mandibular lingual tori  . Flexible sigmoidoscopy  01/17/2012    Procedure: FLEXIBLE SIGMOIDOSCOPY;  Surgeon: Hart Carwin, MD;  Location: [facility] ENDOSCOPY;  Service: Endoscopy;  Laterality: N/A;    reports that he quit smoking about 4 years ago. He has never used smokeless tobacco. He reports that he does not drink alcohol or use illicit drugs. family history includes Alcohol abuse in his others; Aortic aneurysm in his mother; Arthritis in his others; Diabetes in his father and others; Heart attack in his mother; Heart disease in his father and others; Hyperlipidemia in his other; Hypertension in his other; Mental illness in his others; and Stroke in his other. Allergies  Allergen Reactions  . Dexedrine (Dextroamphetamine Sulfate Er)     agitation  . Oxycodone     nausea   Current Outpatient Prescriptions on File Prior to Visit  Medication Sig Dispense Refill  . allopurinol (ZYLOPRIM) 300 MG tablet Take 1 tablet (300 mg total) by mouth daily.  30 tablet  11  . aspirin 81 MG tablet Take 81 mg by mouth daily.        . carvedilol (COREG) 6.25 MG tablet Take 6.25 mg by mouth 2 (two) times daily with a meal.      . citalopram (CELEXA) 20 MG tablet Take 20 mg by mouth every morning.      . clonazePAM (KLONOPIN) 1 MG tablet Take 1 mg by mouth 2 (two) times daily as needed for anxiety.      . fenofibrate 160 MG tablet Take 1 tablet (160 mg total) by mouth daily.  90 tablet  3  . finasteride (PROSCAR) 5 MG tablet Take 5 mg by mouth every morning.      [name] Kitchen glimepiride (AMARYL) 4 MG tablet Take 1 tablet (4 mg total) by mouth daily before breakfast.  90 tablet  3  . HYDROcodone-acetaminophen (NORCO) 10-325 MG per tablet Take 1 tablet by mouth every 6 (six) hours as needed for pain. To fill May 09, 2012  120 tablet  2  . insulin glargine (LANTUS) 100 UNIT/ML injection Inject 50-60 Units into the skin  2 (two) times daily. Takes 60 units in the morning and 50 units at night      . loperamide (IMODIUM) 2 MG capsule Take 2 mg by mouth as needed for diarrhea or loose stools.      . mesalamine (LIALDA) 1.2 G EC tablet Take 1.2 g by mouth 3 (three) times daily.      [name] Kitchen nystatin cream (MYCOSTATIN) Apply 1 application topically 2 (two) times daily as needed for dry skin.      . potassium chloride SA (K-DUR,KLOR-CON) 20 MEQ tablet Take 40 mEq by mouth 2 (two) times daily.      . simvastatin (ZOCOR) 40 MG tablet Take 40 mg by mouth at  bedtime.      . solifenacin (VESICARE) 10 MG tablet Take 10 mg by mouth daily.      . Tamsulosin HCl (FLOMAX) 0.4 MG CAPS Take 0.4 mg by mouth daily after supper.       . topiramate (TOPAMAX) 200 MG tablet Take 200 mg by mouth 2 (two) times daily.       No current facility-administered medications on file prior to visit.   Review of Systems Pt declines today    Objective:   Physical Exam BP 118/80  Pulse 87  Temp(Src) 97.7 F (36.5 C) (Oral)  Wt 241 lb (109.317 kg)  BMI 36.11 kg/m2  SpO2 93% VS noted,  Constitutional: Pt appears well-developed and well-nourished.  HENT: Head: NCAT.  Right Ear: External ear normal.  Left Ear: External ear normal.  Eyes: Conjunctivae and EOM are normal. Pupils are equal, round, and reactive to light.  Neck: Normal range of motion. Neck supple.  Cardiovascular: Normal rate and regular rhythm.   Pulmonary/Chest: Effort normal and breath sounds normal.  Abd:  Soft, NT, non-distended, + BS Spine NT Neurological: Pt is alert. Antalgic gait using cane favoring the RLE Skin: Skin is warm. No erythema Right groin NT, no mass or hernia but pain near inguinal ligament with active hip flexion while seated.  Psychiatric: Pt behavior is normal. Thought content normal. 1+ nervous, somewhat agitated    Assessment & Plan:

## 2012-06-12 NOTE — Assessment & Plan Note (Signed)
Unclear relation, consider MRI if hip/pelvis neg

## 2012-06-12 NOTE — Assessment & Plan Note (Signed)
stable overall by history and exam, recent data reviewed with pt, and pt to continue medical treatment as before,  to f/u any worsening symptoms or concerns Lab Results  Component Value Date   HGBA1C 7.9* 01/16/2012

## 2012-06-12 NOTE — Assessment & Plan Note (Signed)
stable overall by history and exam, recent data reviewed with pt, and pt to continue medical treatment as before,  to f/u any worsening symptoms or concerns BP Readings from Last 3 Encounters:  06/12/12 118/80  06/07/12 131/71  05/09/12 112/70

## 2012-06-13 ENCOUNTER — Ambulatory Visit (INDEPENDENT_AMBULATORY_CARE_PROVIDER_SITE_OTHER)
Admission: RE | Admit: 2012-06-13 | Discharge: 2012-06-13 | Disposition: A | Discharge status: Home / Self Care | Payer: PRIVATE HEALTH INSURANCE | Source: Ambulatory Visit | Attending: Internal Medicine | Admitting: Internal Medicine

## 2012-06-13 ENCOUNTER — Other Ambulatory Visit (INDEPENDENT_AMBULATORY_CARE_PROVIDER_SITE_OTHER): Payer: PRIVATE HEALTH INSURANCE

## 2012-06-13 ENCOUNTER — Telehealth: Payer: Self-pay

## 2012-06-13 ENCOUNTER — Other Ambulatory Visit: Payer: Self-pay | Admitting: Internal Medicine

## 2012-06-13 ENCOUNTER — Encounter (HOSPITAL_COMMUNITY): Payer: Self-pay | Admitting: Dentistry

## 2012-06-13 DIAGNOSIS — E119 Type 2 diabetes mellitus without complications: Secondary | ICD-10-CM

## 2012-06-13 DIAGNOSIS — M545 Low back pain, unspecified: Secondary | ICD-10-CM

## 2012-06-13 DIAGNOSIS — M25551 Pain in right hip: Secondary | ICD-10-CM

## 2012-06-13 DIAGNOSIS — M25559 Pain in unspecified hip: Secondary | ICD-10-CM

## 2012-06-13 LAB — BASIC METABOLIC PANEL
CO2: 22 mEq/L (ref 19–32)
Calcium: 9.6 mg/dL (ref 8.4–10.5)
Chloride: 104 mEq/L (ref 96–112)
Glucose, Bld: 434 mg/dL — ABNORMAL HIGH (ref 70–99)
Potassium: 3.7 mEq/L (ref 3.5–5.1)
Sodium: 134 mEq/L — ABNORMAL LOW (ref 135–145)

## 2012-06-13 LAB — HEPATIC FUNCTION PANEL
ALT: 49 U/L (ref 0–53)
AST: 30 U/L (ref 0–37)
Albumin: 3.5 g/dL (ref 3.5–5.2)
Alkaline Phosphatase: 49 U/L (ref 39–117)
Bilirubin, Direct: 0 mg/dL (ref 0.0–0.3)
Total Protein: 7.2 g/dL (ref 6.0–8.3)

## 2012-06-13 LAB — LIPID PANEL: Total CHOL/HDL Ratio: 7

## 2012-06-13 MED ORDER — FUROSEMIDE 40 MG PO TABS: 40.0000 mg | ORAL_TABLET | Freq: Every day | ORAL | Status: DC

## 2012-06-13 NOTE — Telephone Encounter (Signed)
The wife called to inform they did not received the RX for Diabetic shoes as requested yesterday at appt. And did not get a RX for lasix.  Also she stated on his notes it states he is ambulatory and the wife would like that removed as she states he is not.  She has to help him stand and walk.  Please advise as they are returning today to do xrays and labs from appt. And would like to pickup RX's

## 2012-06-13 NOTE — Telephone Encounter (Signed)
Lasix sent to [facility] 

## 2012-06-13 NOTE — Telephone Encounter (Signed)
Patients wife dropped form off by the office and MD did change to [facility] and did rx for diabetic shoes.  Please advise on lasix to sent in as do not see in notes or med. list

## 2012-06-14 ENCOUNTER — Encounter: Payer: Self-pay | Admitting: Internal Medicine

## 2012-06-18 ENCOUNTER — Ambulatory Visit: Payer: PRIVATE HEALTH INSURANCE | Admitting: Physical Therapy

## 2012-06-19 ENCOUNTER — Encounter: Payer: Self-pay | Admitting: Internal Medicine

## 2012-06-19 ENCOUNTER — Ambulatory Visit (INDEPENDENT_AMBULATORY_CARE_PROVIDER_SITE_OTHER): Payer: PRIVATE HEALTH INSURANCE | Admitting: Internal Medicine

## 2012-06-19 VITALS — BP 122/78 | HR 81 | Temp 97.6°F | Resp 12 | Ht 67.5 in | Wt 236.0 lb

## 2012-06-19 DIAGNOSIS — E1159 Type 2 diabetes mellitus with other circulatory complications: Secondary | ICD-10-CM

## 2012-06-19 MED ORDER — MAGIC MOUTHWASH: 10.0000 mL | Freq: Two times a day (BID) | ORAL | Status: DC

## 2012-06-19 MED ORDER — "SYRINGE/NEEDLE (DISP) 30G X 1/2"" 1 ML MISC": Status: DC

## 2012-06-19 MED ORDER — INSULIN NPH ISOPHANE & REGULAR (70-30) 100 UNIT/ML ~~LOC~~ SUSP: SUBCUTANEOUS | Status: DC

## 2012-06-19 NOTE — Addendum Note (Signed)
Addended by: Carlus Pavlov on: 06/19/2012 05:54 PM   Modules accepted: Orders

## 2012-06-19 NOTE — Progress Notes (Addendum)
Patient ID: [name], male   DOB: [birth year], 72 y.o.   MRN: [id number]  HPI: [name] is a 72 y.o.-year-old male, referred by his PCP, Dr.John, for management of DM2, insulin-dependent, uncontrolled, with complications (CAD, ICMP - s/p 4v CABG in 09/2008, EF 40% in 12/2010, h/o inf MI; h/o CVA). He is here with his wife, who offers most of the history.   Patient has been diagnosed with diabetes in 1998; he started insulin ~1 year ago. Last hemoglobin A1c was: Lab Results  Component Value Date   HGBA1C 11.2* 06/13/2012  Prev. 7.9% in 12/2011, prev. 10.3%  Pt is on a regimen of: - Lantus 60 in am and 50 in hsunits qhs (vial) - Amaryl 4 mg in am  - but he does not want to take him am meds (per wife) - pt does not comment on this His wife is giving him the injections.  Pt checks his sugars 2x a day (does not keep a log) and they are: - am: 400-480  - mid-afternoon: 370  He is not checking his sugars, his wife does. If wife not home, his daughter. No lows. Lowest sugar was 370 in the last 4 months; he was in rehab for 20 days up to 4 months ago, his sugars were in the 150s then; he has hypoglycemia awareness at 70. Highest sugar was >600 (HI).   Pt's meals are: - Breakfast: pancakes + sugar free syrup; eggs; egg sandwich - Lunch: soup or chilli beans, or sandwich or ;leftover - Dinner: spaghetti, fried meats with white potatoes or corn or beans; hamburgers - Snacks: grazes all day, per wife Drinks 1 gallon a milk a day! He sleeps all day and grazes at night. He gained 21 lbs in last 4 months.  Pt does not have chronic kidney disease, last BUN/creatinine was:  Lab Results  Component Value Date   BUN 21 06/13/2012   CREATININE 0.9 06/13/2012   Last set of lipids: Lab Results  Component Value Date   CHOL 230* 06/13/2012   HDL 33.50* 06/13/2012   LDLCALC  Value: 49       08/04/2009   LDLDIRECT 85.2 06/13/2012   TRIG 813.0 Triglyceride is over 400; calculations on Lipids are  invalid.* 06/13/2012   CHOLHDL 7 06/13/2012   Pt's last eye exam was in 2 years. No DR. Denies having feeling in his in his legs.  PMH: I reviewed his chart and he also has a history of HTN, HL, gout, UC, urine incontinence, BPH, OSA, narcolepsy, history of alcohol abuse, depression  Pt has FH of DM in father and PGM.  ROS: Constitutional: weight gain, increased appetite, no fatigue, + subjective hypothermia, + nocturia Eyes: no blurry vision, no xerophthalmia ENT: no sore throat, no nodules palpated in throat, no dysphagia/odynophagia, no hoarseness, decreased hearing Cardiovascular: no CP/SOB/palpitations/+ leg swelling Respiratory: no cough/SOB Gastrointestinal: no N/V/D/C Musculoskeletal: + muscle/+ joint aches Skin: no rashes Neurological: no tremors/numbness/tingling/dizziness Psychiatric: no depression/anxiety  Past Surgical History  Procedure Laterality Date  . Coronary artery bypass graft      Median sternotomy, extracorporeal circulation,  coronary artery bypass graft surgery x4 using a sequential left internal   mammary artery graft to the mid and distal left anterior descending, a   saphenous vein graft to diagonal branch of the left anterior descending,   and a saphenous vein graft to the obtuse marginal branch of left   circumflex coronary artery.    . Cardiac catheterization  Triple-vessel coronary artery disease. Low-normal left ventricular systolic function with mild   anteroapical wall motion abnormality.   . Tonsilectomy, adenoidectomy, bilateral myringotomy and tubes  1946  . Multiple extractions with alveoloplasty  12/01/2011    Procedure: MULTIPLE EXTRACION WITH ALVEOLOPLASTY;  Surgeon: Charlynne Pander, DDS;  Location: [facility] OR;  Service: Oral Surgery;  Laterality: N/A;  Extraction of tooth #'s 3,4,5,6,7,8,9,10,11,12,13,14,15,16,17,20,21,22,23,24,25,26,27,28,29,30,31,32 with alveoloplasty and bilateral mandibular lingual tori  . Flexible sigmoidoscopy  01/17/2012     Procedure: FLEXIBLE SIGMOIDOSCOPY;  Surgeon: Hart Carwin, MD;  Location: [facility];  Service: Endoscopy;  Laterality: N/A;    History   Social History  . Marital Status: Married    Spouse Name: N/A    Number of Children: 2  . Years of Education: 12   Occupational History  . retired    Social History Main Topics  . Smoking status: Former Smoker    Quit date: 01/18/2008  . Smokeless tobacco: Never Used  . Alcohol Use: No  . Drug Use: No  . Sexually Active: No   Other Topics Concern  . Not on file   Social History Narrative   Patient has been married for 51 years.   Patient presents with his wife today.    Patient has 2 grown children and lives in [place] and [place] areas respectively.   Patient is a nonsmoker, nondrinker at this time.   Current Outpatient Prescriptions on File Prior to Visit  Medication Sig Dispense Refill  . allopurinol (ZYLOPRIM) 300 MG tablet Take 1 tablet (300 mg total) by mouth daily.  30 tablet  11  . aspirin 81 MG tablet Take 81 mg by mouth daily.        . carvedilol (COREG) 6.25 MG tablet Take 6.25 mg by mouth 2 (two) times daily with a meal.      . citalopram (CELEXA) 20 MG tablet Take 20 mg by mouth every morning.      . clonazePAM (KLONOPIN) 1 MG tablet Take 1 mg by mouth 2 (two) times daily as needed for anxiety.      . fenofibrate 160 MG tablet Take 1 tablet (160 mg total) by mouth daily.  90 tablet  3  . finasteride (PROSCAR) 5 MG tablet Take 5 mg by mouth every morning.      . furosemide (LASIX) 40 MG tablet Take 1 tablet (40 mg total) by mouth daily.  90 tablet  3  . HYDROcodone-acetaminophen (NORCO) 10-325 MG per tablet Take 1 tablet by mouth every 6 (six) hours as needed for pain. To fill May 09, 2012  120 tablet  2  . loperamide (IMODIUM) 2 MG capsule Take 2 mg by mouth as needed for diarrhea or loose stools.      . mesalamine (LIALDA) 1.2 G EC tablet Take 1.2 g by mouth 3 (three) times daily.      [name] Kitchen nystatin cream  (MYCOSTATIN) Apply 1 application topically 2 (two) times daily as needed for dry skin.      . potassium chloride SA (K-DUR,KLOR-CON) 20 MEQ tablet Take 40 mEq by mouth 2 (two) times daily.      . simvastatin (ZOCOR) 40 MG tablet Take 40 mg by mouth at bedtime.      . solifenacin (VESICARE) 10 MG tablet Take 10 mg by mouth daily.      . Tamsulosin HCl (FLOMAX) 0.4 MG CAPS Take 0.4 mg by mouth daily after supper.       . topiramate (TOPAMAX) 200 MG  tablet Take 200 mg by mouth 2 (two) times daily.       No current facility-administered medications on file prior to visit.   Allergies  Allergen Reactions  . Dexedrine (Dextroamphetamine Sulfate Er)     agitation  . Oxycodone     nausea   Family History  Problem Relation Age of Onset  . Diabetes Father   . Heart disease Father   . Heart attack Mother   . Aortic aneurysm Mother   . Alcohol abuse Other   . Arthritis Other   . Hyperlipidemia Other   . Heart disease Other   . Stroke Other   . Hypertension Other   . Diabetes Other   . Mental illness Other   . Alcohol abuse Other   . Arthritis Other   . Heart disease Other   . Mental illness Other   . Diabetes Other    PE: BP 122/78  Pulse 81  Temp(Src) 97.6 F (36.4 C) (Oral)  Resp 12  Ht 5' 7.5" (1.715 m)  Wt 236 lb (107.049 kg)  BMI 36.4 kg/m2  SpO2 95% Wt Readings from Last 3 Encounters:  06/19/12 236 lb (107.049 kg)  06/12/12 241 lb (109.317 kg)  05/09/12 237 lb 2 oz (107.559 kg)   Constitutional: Obese, in wheelchair, falling asleep during the exam, otherwise in NAD; flat affect Eyes: PERRLA, EOMI, no exophthalmos ENT: moist mucous membranes, + thrush, no thyromegaly, no cervical lymphadenopathy Cardiovascular: RRR, No MRG; + lower extremity edema bilaterally Respiratory: CTA B Gastrointestinal: abdomen soft, NT, ND, BS+ Musculoskeletal: no deformities, strength intact in all 4 Skin: moist, warm, stasis dermatitis in bilateral legs Neurological: no tremor with  outstretched hands, DTR normal in all 4  ASSESSMENT: 1. DM2, insulin-dependent, uncontrolled, with complications - CAD, ICMP - s/p 4v CABG in 09/2008, EF 40% in 12/2010, h/o inf MI - cerebro-vascular ds. - h/o occipital CVA - diabetic neuropathy - poor understanding of disease concepts - medication noncompliance  2. Thrush - had this in the past, too  PLAN:  1. Patient with long-standing diabetes, on basal insulin only, with a very poor understanding of disease concepts and without the desire to change anything to improve it. We discussed about improving his diet, starting with eating less, and for example drinking one glass of milk a day rather than 1 gallon (which has ~2000 cal). He has absolutely no desire to change this as he says that he likes milk and he has been drinking it his entire life. He also grazes the entire day, and has large portions of food. He does not give himself the injections and also does not check his sugars (this is all done by wife). Unfortunately, I do not foresee great progress in his diabetes care he due to his negative attitude towards suggestions for improvement. - since having sugars in the 400s or 500s is immediately detrimental for him, I suggesting switching to Humulin insulin 70/30, so he can get meal coverage from the 2 injections that he gets in the day. We'll start with 60 units twice a day. I suggested regular rather than rapid acting insulin on purpose, to hopefully better cover his grazing. - given sugar log and advised how to fill it and to bring it at next appt - check 3 times a day - given foot care handout and explained the principles - given instructions for hypoglycemia management "15-15 rule" - I will see him back in 3 weeks to adjust his insulin dose. We might  add metformin too, but not at this visit to keep his regimen more simple  2. Ginette Pitman - will send magic mouthwash to pharmacy

## 2012-06-19 NOTE — Patient Instructions (Addendum)
Please check sugars 3x a day. Stop Lantus and Amaryl. Start Insulin 70/30 60 units 30 minutes before breakfast and dinner. Please return in 3 weeks with your sugar log.

## 2012-07-12 ENCOUNTER — Ambulatory Visit (INDEPENDENT_AMBULATORY_CARE_PROVIDER_SITE_OTHER): Payer: PRIVATE HEALTH INSURANCE | Admitting: Internal Medicine

## 2012-07-12 ENCOUNTER — Encounter: Payer: Self-pay | Admitting: Internal Medicine

## 2012-07-12 VITALS — BP 122/74 | HR 90 | Temp 98.2°F | Resp 12 | Ht 67.5 in | Wt 235.0 lb

## 2012-07-12 DIAGNOSIS — E1159 Type 2 diabetes mellitus with other circulatory complications: Secondary | ICD-10-CM

## 2012-07-12 MED ORDER — INSULIN NPH ISOPHANE & REGULAR (70-30) 100 UNIT/ML ~~LOC~~ SUSP: SUBCUTANEOUS | Status: DC

## 2012-07-12 MED ORDER — GLUCOSE BLOOD VI STRP: ORAL_STRIP | Status: DC

## 2012-07-12 NOTE — Progress Notes (Signed)
Patient ID: [name], male   DOB: [birth year], 72 y.o.   MRN: [id number]  HPI: [name] is a 72 y.o.-year-old male, returning for f/u for DM2, dx 1998, insulin-dependent, uncontrolled, with complications (CAD, ICMP - s/p 4v CABG in 09/2008, EF 40% in 12/2010, h/o inf MI; h/o CVA). He is here with his wife, who offers most of the history. Last visit 3 weeks ago.  Last hemoglobin A1c was: Lab Results  Component Value Date   HGBA1C 11.2* 06/13/2012  Prev. 7.9% in 12/2011, prev. 10.3%.   Pt is on a regimen of: - Humulin 70/30 60 units bid we switched from Lantus at last visit due to cost - the 70/30 is acceptable for them in term of cost. His wife gives it to him after meals, instead of before! We stopped Amaryl 4 mg in am at last visit.  Pt checks his sugars 2x a day (does not keep a log) and they are: - am: 400-480 >> now 300-450s - mid-afternoon: 370 >> now 300-450s Sugar today in the office: 414 He is not checking his sugars, his wife does. If wife not home, his daughter. No lows. Lowest sugar was 287 in the last month; he has hypoglycemia awareness at 70. Highest sugar was >600 (HI).   His poor diet and total lack of activity are likely the culprits for his high sugars. His wife admits to cooking most food with butter... Of note, he was in rehab for 20 days up to 5 months ago, his sugars were in the 150s! Per wife, he grazes all day and drinks a little less than 1 gallon a milk a day! He adamantly refuses to change this habit. He mostly sleeps throughout the day.  Pt does not have chronic kidney disease, last BUN/creatinine was:  Lab Results  Component Value Date   BUN 21 06/13/2012   CREATININE 0.9 06/13/2012   Last set of lipids: Lab Results  Component Value Date   CHOL 230* 06/13/2012   HDL 33.50* 06/13/2012   LDLCALC  Value: 49       08/04/2009   LDLDIRECT 85.2 06/13/2012   TRIG 813.0  06/13/2012   CHOLHDL 7 06/13/2012   Pt's last eye exam was in 2 years. No DR.  Denies having feeling in his in his legs.  PMH: I reviewed his chart and he also has a history of HTN, HL, gout, UC, urine incontinence, BPH, OSA, narcolepsy, history of alcohol abuse, depression  I reviewed pt's medications, allergies, PMH, social hx, family hx and no changes required, except as mentioned above.  ROS: Constitutional: weight gain, increased appetite, + fatigue, + subjective hypothermia, + nocturia Eyes: + blurry vision, no xerophthalmia ENT: no sore throat, no nodules palpated in throat, no dysphagia/odynophagia, no hoarseness, decreased hearing Cardiovascular: no CP/SOB/palpitations/+ leg swelling Respiratory: no cough/SOB Gastrointestinal: no N/V/D/C Musculoskeletal: + muscle/+ joint aches Skin: no rashes Neurological: no tremors/numbness/tingling/dizziness Psychiatric: no depression/anxiety  PE: BP 122/74  Pulse 90  Temp(Src) 98.2 F (36.8 C) (Oral)  Resp 12  Ht 5' 7.5" (1.715 m)  Wt 235 lb (106.595 kg)  BMI 36.24 kg/m2  SpO2 95% Wt Readings from Last 3 Encounters:  07/12/12 235 lb (106.595 kg)  06/19/12 236 lb (107.049 kg)  06/12/12 241 lb (109.317 kg)   Constitutional: Obese, in NAD; flat affect Eyes: PERRLA, EOMI, no exophthalmos ENT: moist mucous membranes,  no thyromegaly, no cervical lymphadenopathy Cardiovascular: RRR, No MRG; + lower extremity edema bilaterally Respiratory: CTA B Gastrointestinal:  abdomen soft, NT, ND, BS+ Musculoskeletal: no deformities, strength intact in all 4 Skin: moist, warm, stasis dermatitis in bilateral legs Neurological: no tremor with outstretched hands, DTR normal in all 4  ASSESSMENT: 1. DM2, insulin-dependent, uncontrolled, with complications - CAD, ICMP - s/p 4v CABG in 09/2008, EF 40% in 12/2010, h/o inf MI - cerebro-vascular ds. - h/o occipital CVA - diabetic neuropathy - poor understanding of disease concepts - h/o medication noncompliance  PLAN:  1. Patient with long-standing diabetes, on basal  insulin only, with a very poor understanding of disease concepts and without the desire to change anything to improve it. We discussed about improving his diet, starting with eating less, and for example drinking one glass of milk a day rather than 1 gallon (which has ~2000 cal). He has absolutely no desire to change this as he says that he likes milk and he has been drinking it his entire life. He also grazes the entire day, and has large portions of food. He does not give himself the injections and also does not check his sugars (this is all done by wife).  - I will refer him to nutrition to hopefully help with his diet as a major culprit for his high sugars - i advised his wife to maybe not buy milk anymore or give him just a glass a day - I advised him to try to be a little more active - since sugars are still mostly in the 400s, will increase Humulin insulin 70/30 to 80 units bid.  - I advised his wife to give the insulin to him 30 min before meals rather than after meals. I suggested regular rather than rapid acting insulin on purpose, to hopefully better cover his grazing. - given new sugar logs and advised how to fill it and to bring it at next appt - check 3 times a day - I will see him back in 1 mo to adjust his insulin dose. We might add metformin too, but not at this visit to keep his regimen more simple

## 2012-07-12 NOTE — Patient Instructions (Addendum)
Please increase the 70/30 insulin 80 units 30 minutes before breakfast and dinner. Please try to walk a little more for exercise. Drink no more than 1 glass of milk a day.

## 2012-07-16 ENCOUNTER — Other Ambulatory Visit: Payer: Self-pay | Admitting: *Deleted

## 2012-07-16 ENCOUNTER — Ambulatory Visit: Payer: Self-pay | Admitting: Cardiovascular Disease

## 2012-07-16 MED ORDER — ONETOUCH ULTRA SYSTEM W/DEVICE KIT: 1.0000 | PACK | Freq: Once | Status: DC

## 2012-07-16 MED ORDER — GLUCOSE BLOOD VI STRP: ORAL_STRIP | Status: DC

## 2012-07-16 NOTE — Telephone Encounter (Signed)
Pt's wife called and stated pt needs a new meter and strips. Dr Elvera Lennox ok'd to order both.

## 2012-07-19 ENCOUNTER — Ambulatory Visit: Payer: Self-pay | Admitting: Internal Medicine

## 2012-07-22 ENCOUNTER — Emergency Department (HOSPITAL_COMMUNITY): Payer: PRIVATE HEALTH INSURANCE

## 2012-07-22 ENCOUNTER — Emergency Department (HOSPITAL_COMMUNITY)
Admission: EM | Admit: 2012-07-22 | Discharge: 2012-07-22 | Disposition: A | Discharge status: Home / Self Care | Payer: PRIVATE HEALTH INSURANCE | Attending: Emergency Medicine | Admitting: Emergency Medicine

## 2012-07-22 ENCOUNTER — Other Ambulatory Visit: Payer: Self-pay

## 2012-07-22 ENCOUNTER — Encounter (HOSPITAL_COMMUNITY): Payer: Self-pay | Admitting: *Deleted

## 2012-07-22 DIAGNOSIS — Z8669 Personal history of other diseases of the nervous system and sense organs: Secondary | ICD-10-CM | POA: Insufficient documentation

## 2012-07-22 DIAGNOSIS — R5381 Other malaise: Secondary | ICD-10-CM | POA: Insufficient documentation

## 2012-07-22 DIAGNOSIS — Z7982 Long term (current) use of aspirin: Secondary | ICD-10-CM | POA: Insufficient documentation

## 2012-07-22 DIAGNOSIS — Z87448 Personal history of other diseases of urinary system: Secondary | ICD-10-CM | POA: Insufficient documentation

## 2012-07-22 DIAGNOSIS — Z794 Long term (current) use of insulin: Secondary | ICD-10-CM | POA: Insufficient documentation

## 2012-07-22 DIAGNOSIS — Z8639 Personal history of other endocrine, nutritional and metabolic disease: Secondary | ICD-10-CM | POA: Insufficient documentation

## 2012-07-22 DIAGNOSIS — Z8673 Personal history of transient ischemic attack (TIA), and cerebral infarction without residual deficits: Secondary | ICD-10-CM | POA: Insufficient documentation

## 2012-07-22 DIAGNOSIS — Z79899 Other long term (current) drug therapy: Secondary | ICD-10-CM | POA: Insufficient documentation

## 2012-07-22 DIAGNOSIS — G8929 Other chronic pain: Secondary | ICD-10-CM | POA: Insufficient documentation

## 2012-07-22 DIAGNOSIS — I10 Essential (primary) hypertension: Secondary | ICD-10-CM | POA: Insufficient documentation

## 2012-07-22 DIAGNOSIS — Z951 Presence of aortocoronary bypass graft: Secondary | ICD-10-CM | POA: Insufficient documentation

## 2012-07-22 DIAGNOSIS — E669 Obesity, unspecified: Secondary | ICD-10-CM | POA: Insufficient documentation

## 2012-07-22 DIAGNOSIS — R531 Weakness: Secondary | ICD-10-CM

## 2012-07-22 DIAGNOSIS — I2510 Atherosclerotic heart disease of native coronary artery without angina pectoris: Secondary | ICD-10-CM | POA: Insufficient documentation

## 2012-07-22 DIAGNOSIS — Z862 Personal history of diseases of the blood and blood-forming organs and certain disorders involving the immune mechanism: Secondary | ICD-10-CM | POA: Insufficient documentation

## 2012-07-22 DIAGNOSIS — F3289 Other specified depressive episodes: Secondary | ICD-10-CM | POA: Insufficient documentation

## 2012-07-22 DIAGNOSIS — F329 Major depressive disorder, single episode, unspecified: Secondary | ICD-10-CM | POA: Insufficient documentation

## 2012-07-22 DIAGNOSIS — Z8719 Personal history of other diseases of the digestive system: Secondary | ICD-10-CM | POA: Insufficient documentation

## 2012-07-22 DIAGNOSIS — F319 Bipolar disorder, unspecified: Secondary | ICD-10-CM | POA: Insufficient documentation

## 2012-07-22 DIAGNOSIS — M109 Gout, unspecified: Secondary | ICD-10-CM | POA: Insufficient documentation

## 2012-07-22 DIAGNOSIS — Z8679 Personal history of other diseases of the circulatory system: Secondary | ICD-10-CM | POA: Insufficient documentation

## 2012-07-22 DIAGNOSIS — I252 Old myocardial infarction: Secondary | ICD-10-CM | POA: Insufficient documentation

## 2012-07-22 DIAGNOSIS — Z66 Do not resuscitate: Secondary | ICD-10-CM | POA: Insufficient documentation

## 2012-07-22 DIAGNOSIS — Z87891 Personal history of nicotine dependence: Secondary | ICD-10-CM | POA: Insufficient documentation

## 2012-07-22 DIAGNOSIS — Z9861 Coronary angioplasty status: Secondary | ICD-10-CM | POA: Insufficient documentation

## 2012-07-22 DIAGNOSIS — E119 Type 2 diabetes mellitus without complications: Secondary | ICD-10-CM | POA: Insufficient documentation

## 2012-07-22 LAB — URINE MICROSCOPIC-ADD ON

## 2012-07-22 LAB — CBC WITH DIFFERENTIAL/PLATELET
Basophils Absolute: 0 10*3/uL (ref 0.0–0.1)
Basophils Relative: 0 % (ref 0–1)
Eosinophils Absolute: 0.3 10*3/uL (ref 0.0–0.7)
Eosinophils Relative: 5 % (ref 0–5)
HCT: 38.1 % — ABNORMAL LOW (ref 39.0–52.0)
Hemoglobin: 13.2 g/dL (ref 13.0–17.0)
MCH: 31.7 pg (ref 26.0–34.0)
MCHC: 34.6 g/dL (ref 30.0–36.0)
MCV: 91.4 fL (ref 78.0–100.0)
Monocytes Absolute: 0.5 10*3/uL (ref 0.1–1.0)
Monocytes Relative: 7 % (ref 3–12)
Neutro Abs: 4.3 10*3/uL (ref 1.7–7.7)
RDW: 13.2 % (ref 11.5–15.5)

## 2012-07-22 LAB — COMPREHENSIVE METABOLIC PANEL
Albumin: 2.9 g/dL — ABNORMAL LOW (ref 3.5–5.2)
Alkaline Phosphatase: 45 U/L (ref 39–117)
BUN: 25 mg/dL — ABNORMAL HIGH (ref 6–23)
Creatinine, Ser: 0.85 mg/dL (ref 0.50–1.35)
GFR calc Af Amer: >90 mL/min (ref >90)
Glucose, Bld: 225 mg/dL — ABNORMAL HIGH (ref 70–99)
Potassium: 3.6 mEq/L (ref 3.5–5.1)
Total Protein: 6.3 g/dL (ref 6.0–8.3)

## 2012-07-22 LAB — URINALYSIS, ROUTINE W REFLEX MICROSCOPIC
Bilirubin Urine: NEGATIVE
Hgb urine dipstick: NEGATIVE
Ketones, ur: NEGATIVE mg/dL
Nitrite: NEGATIVE
Protein, ur: 100 mg/dL — AB
Specific Gravity, Urine: 1.024 (ref 1.005–1.030)
Urobilinogen, UA: 0.2 mg/dL (ref 0.0–1.0)

## 2012-07-22 LAB — TROPONIN I: Troponin I: <0.3 ng/mL (ref <0.30)

## 2012-07-22 MED ORDER — SODIUM CHLORIDE 0.9 % IV BOLUS (SEPSIS)
1000.0000 mL | Freq: Once | INTRAVENOUS | Status: AC
  Administered 2012-07-22: 1000 mL via INTRAVENOUS

## 2012-07-22 NOTE — ED Notes (Signed)
Pt is not c/o any pain at present.  Wife at the bedside

## 2012-07-22 NOTE — ED Provider Notes (Addendum)
History    CSN: [id number] Arrival date & time 07/22/12  1478  First MD Initiated Contact with Patient 07/22/12 0410     Chief Complaint  Patient presents with  . Weakness   (Consider location/radiation/quality/duration/timing/severity/associated sxs/prior Treatment) Patient is a 72 y.o. male presenting with weakness.  Weakness   Pt with numerous medical problems including CAD, previous stroke and long standing poorly controlled DM brought to the ED via EMS after he was unable to get up off the commode earlier this evening due to leg weakness. Wife states he has had some diarrhea recently. He got up to use the bathroom and was unable to get off the commode. She helped him back to his walker, but his legs gave out and so she helped him to the ground. He has not had any CP, SOB, N/V or dysuria. No fever. She reports he had a similar episode earlier in the week and at that time was slumped to the right. He has not had a residual neuro deficit per wife.   Past Medical History  Diagnosis Date  . CAD (coronary artery disease)     s/p 4V CABG 10/15/08; EF is 50%  . Obesity   . BPH (benign prostatic hypertrophy)   . Edema   . Left ventricular systolic dysfunction     EF is 40% per echo December 2012  . AMI, INFERIOR WALL 09/30/2008    Qualifier: Diagnosis of  By: Sherral Hammers, RN, BSN, Melanie    . CAD, ARTERY BYPASS GRAFT 11/18/2008    Qualifier: Diagnosis of  By: Clifton James, MD, Cristal Deer    . 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 UNIVERSAL ULCERATIVE COLITIS 07/31/2007    Qualifier: Diagnosis of  By: Arlyce Dice MD, Barbette Hair   . Ischemic cardiomyopathy 05/05/2011  . DM 09/01/2008    Qualifier: Diagnosis of  By: Kem Parkinson    . OBSTRUCTIVE  SLEEP APNEA 09/16/2008    Qualifier: Diagnosis of  By: Shelle Iron MD, Maree Krabbe   . HYPERTENSION 09/01/2008    Qualifier: Diagnosis of  By: Kem Parkinson    . Hyperlipidemia 05/18/2010  . Gout 09/23/2011  . Bipolar affective disorder 09/23/2011  . TIA (transient ischemic attack) 09/23/2011  . Chronic pain 09/23/2011  . DNR (do not resuscitate) 09/23/2011    Per July 2011 [facility] admission  . CVA (cerebral infarction) 09/23/2011    Small left occipital  . 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 History of alcohol abuse 09/23/2011  . Blood in stool   . Depression   . Urine incontinence   . Stroke     "mini strokes"  . Incontinence of urine   . Neuromuscular disorder     Diabetic neuropathy  . [facility] spotted fever   . Benign neoplasm of colon 03/2011    Adenomatous polyps   Past Surgical History  Procedure Laterality Date  . Coronary artery bypass graft      Median sternotomy, extracorporeal circulation,  coronary artery bypass graft surgery x4 using a sequential left internal   mammary artery graft to the mid and distal left anterior descending, a   saphenous vein graft to diagonal branch of the left anterior descending,   and a saphenous vein graft to the obtuse marginal branch of left  circumflex coronary artery.    . Cardiac catheterization      Triple-vessel coronary artery disease. Low-normal left ventricular systolic function with mild   anteroapical wall motion abnormality.   . Tonsilectomy, adenoidectomy, bilateral myringotomy and tubes  1946  . Multiple extractions with alveoloplasty  12/01/2011    Procedure: MULTIPLE EXTRACION WITH ALVEOLOPLASTY;  Surgeon: Charlynne Pander, DDS;  Location: [facility] OR;  Service: Oral Surgery;  Laterality: N/A;  Extraction of tooth #'s 3,4,5,6,7,8,9,10,11,12,13,14,15,16,17,20,21,22,23,24,25,26,27,28,29,30,31,32 with alveoloplasty and bilateral mandibular lingual tori  . Flexible sigmoidoscopy  01/17/2012    Procedure: FLEXIBLE SIGMOIDOSCOPY;  Surgeon: Hart Carwin,  MD;  Location: [facility] ENDOSCOPY;  Service: Endoscopy;  Laterality: N/A;   Family History  Problem Relation Age of Onset  . Diabetes Father   . Heart disease Father   . Heart attack Mother   . Aortic aneurysm Mother   . Alcohol abuse Other   . Arthritis Other   . Hyperlipidemia Other   . Heart disease Other   . Stroke Other   . Hypertension Other   . Diabetes Other   . Mental illness Other   . Alcohol abuse Other   . Arthritis Other   . Heart disease Other   . Mental illness Other   . Diabetes Other    History  Substance Use Topics  . Smoking status: Former Smoker    Quit date: 01/18/2008  . Smokeless tobacco: Never Used  . Alcohol Use: No    Review of Systems  Neurological: Positive for weakness.   All other systems reviewed and are negative except as noted in HPI.   Allergies  Dexedrine and Oxycodone  Home Medications   Current Outpatient Rx  Name  Route  Sig  Dispense  Refill  . allopurinol (ZYLOPRIM) 300 MG tablet   Oral   Take 1 tablet (300 mg total) by mouth daily.   30 tablet   11   . Alum & Mag Hydroxide-Simeth (MAGIC MOUTHWASH) SOLN   Oral   Take 10 mLs by mouth 2 (two) times daily.   150 mL   0   . aspirin 81 MG tablet   Oral   Take 81 mg by mouth daily.           . Blood Glucose Monitoring Suppl (ONE TOUCH ULTRA SYSTEM KIT) W/DEVICE KIT   Does not apply   1 kit by Does not apply route once.   1 each   0     ONE TOUCH VERIO IQ   . carvedilol (COREG) 6.25 MG tablet   Oral   Take 6.25 mg by mouth 2 (two) times daily with a meal.         . citalopram (CELEXA) 20 MG tablet   Oral   Take 20 mg by mouth every morning.         . clonazePAM (KLONOPIN) 1 MG tablet   Oral   Take 1 mg by mouth 2 (two) times daily as needed for anxiety.         . fenofibrate 160 MG tablet   Oral   Take 1 tablet (160 mg total) by mouth daily.   90 tablet   3   . finasteride (PROSCAR) 5 MG tablet   Oral   Take 5 mg by mouth every morning.          . furosemide (LASIX) 40 MG tablet   Oral   Take 1 tablet (40 mg total) by mouth daily.   90 tablet  3   . glucose blood test strip      Use as instructed   200 each   12     Dispense as written.   [name] Kitchen glucose blood test strip      Use as instructed   200 each   12     ONE TOUCH VERIO IQ TEST STRIPS   . HYDROcodone-acetaminophen (NORCO) 10-325 MG per tablet   Oral   Take 1 tablet by mouth every 6 (six) hours as needed for pain. To fill May 09, 2012   120 tablet   2   . insulin NPH-regular (HUMULIN 70/30) (70-30) 100 UNIT/ML injection      Inject 80 units 30 minutes before breakfast and dinner   30 mL   3   . Insulin Syringe-Needle U-100 (INSULIN SYRINGE .5CC/30GX5/16") 30G X 5/16" 0.5 ML MISC               . loperamide (IMODIUM) 2 MG capsule   Oral   Take 2 mg by mouth as needed for diarrhea or loose stools.         . mesalamine (LIALDA) 1.2 G EC tablet   Oral   Take 1.2 g by mouth 3 (three) times daily.         [name] Kitchen nystatin cream (MYCOSTATIN)   Topical   Apply 1 application topically 2 (two) times daily as needed for dry skin.         [name] Kitchen simvastatin (ZOCOR) 40 MG tablet   Oral   Take 40 mg by mouth at bedtime.         . solifenacin (VESICARE) 10 MG tablet   Oral   Take 10 mg by mouth daily.         . Syringe/Needle, Disp, (B-D ECLIPSE SYRINGE) 30G X 1/2" 1 ML MISC      Use twice a day as advised   100 each   3   . Tamsulosin HCl (FLOMAX) 0.4 MG CAPS   Oral   Take 0.4 mg by mouth daily after supper.          . topiramate (TOPAMAX) 200 MG tablet   Oral   Take 200 mg by mouth 2 (two) times daily.          BP 113/66  Pulse 76  Temp(Src) 97.8 F (36.6 C) (Oral)  Resp 20  SpO2 95% Physical Exam  Nursing note and vitals reviewed. Constitutional: He is oriented to person, place, and time. He appears well-developed and well-nourished.  HENT:  Head: Normocephalic and atraumatic.  Eyes: EOM are normal. Pupils are equal, round, and  reactive to light.  Neck: Normal range of motion. Neck supple.  Cardiovascular: Normal rate, normal heart sounds and intact distal pulses.   Pulmonary/Chest: Effort normal and breath sounds normal.  Abdominal: Bowel sounds are normal. He exhibits no distension. There is no tenderness.  Musculoskeletal: Normal range of motion. He exhibits edema. He exhibits no tenderness.  Neurological: He is alert and oriented to person, place, and time. He displays normal reflexes. No cranial nerve deficit or sensory deficit. Coordination normal.  Strength 4/5 in BUE, 5/5 in LLE and 3/5 in RLE  Skin: Skin is warm and dry. No rash noted.  Psychiatric: He has a normal mood and affect.    ED Course  Procedures (including critical care time) Labs Reviewed  CBC WITH DIFFERENTIAL - Abnormal; Notable for the following:    RBC 4.17 (*)    HCT 38.1 (*)  All other components within normal limits  URINALYSIS, ROUTINE W REFLEX MICROSCOPIC - Abnormal; Notable for the following:    Glucose, UA >1000 (*)    Protein, ur 100 (*)    All other components within normal limits  COMPREHENSIVE METABOLIC PANEL - Abnormal; Notable for the following:    Glucose, Bld 225 (*)    BUN 25 (*)    Albumin 2.9 (*)    GFR calc non Af Amer 84 (*)    All other components within normal limits  TROPONIN I  URINE MICROSCOPIC-ADD ON   Dg Chest 2 View  07/22/2012   *RADIOLOGY REPORT*  Clinical Data: Weakness.  Extensive cardiac history.  CHEST - 2 VIEW  Comparison: 06/07/2012  Findings: Stable postoperative changes in the mediastinum.  Cardiac enlargement with borderline pulmonary vascularity.  No focal consolidation or airspace disease in the lungs.  No blunting of costophrenic angles.  No pneumothorax.  Mediastinal contours appear intact.  Degenerative changes in the spine.  Calcified and tortuous aorta.  No significant change since previous study.  IMPRESSION: Cardiac enlargement.  No evidence of active pulmonary disease.   Original  Report Authenticated By: Burman Nieves, M.D.   Ct Head Wo Contrast  07/22/2012   *RADIOLOGY REPORT*  Clinical Data: Injury after fall.  Weakness in the legs.  Diarrhea and weakness for 2 weeks.  CT HEAD WITHOUT CONTRAST  Technique:  Contiguous axial images were obtained from the base of the skull through the vertex without contrast.  Comparison: CT head 08/02/2009.  MRI brain 08/03/2009  Findings: Diffuse cerebral atrophy.  Mild ventricular dilatation consistent with central atrophy.  Low attenuation changes in the deep white matter consistent with small vessel ischemic change. Low attenuation change consistent with small vessel ischemia also suggested in the pons.  Old area of encephalomalacia in the left posterior occipital region suggesting old infarct.  No significant change in the appearance of the intracranial contents since the previous study.  No mass effect or midline shift.  No abnormal extra-axial fluid collections.  Gray-white matter junctions are distinct.  Basal cisterns are not effaced.  No evidence of acute intracranial hemorrhage.  No depressed skull fractures.  Visualized paranasal sinuses and mastoid air cells are not opacified. Vascular calcifications.  IMPRESSION: No acute intracranial abnormalities.  Diffuse cerebral atrophy. Old encephalomalacia in the left occipital region.   Original Report Authenticated By: Burman Nieves, M.D.   1. Generalized weakness     MDM   Date: 07/22/2012  Rate: 77  Rhythm: ? atrial fibrillation vs sinus arhythmia, baseline wander and artifact obscure P-waves  QRS Axis: left  Intervals: normal  ST/T Wave abnormalities: nonspecific ST/T changes  Conduction Disutrbances:nonspecific intraventricular conduction delay  Narrative Interpretation:   Old EKG Reviewed: if afib this is new  Pt feeling better, ambulating around the nurses station with minimal assistance. Wife reports his R leg weakness has been ongoing for several months. He had previously  been at [facility] for rehab and doing well with better diet control, medication administration and PT, however since he's been home the last 6 months he has not been active, lying in bed most of the time, poor diet and poor glycemic control. The wife states he would not participate in PT if they came to the house and he is not interested in making any lifestyle changes. I suspect his episode of weakness tonight was related to deconditioning, doubt this is ACS/MI, he has been in NSR on monitor, ?afib seen on initial EKG likely  artifact instead. Doubt this is acute or subacute stroke given chronicity and waxing/waning symptoms. No evidence of UTI, elyte abnormality or anemia as a cause either. No signs of occult infection or sepsis. Wife is comfortable with the patient returning home. Patient wants to go home as well. PCP followup.     Charles B. Bernette Mayers, MD 07/22/12 [phone]

## 2012-07-22 NOTE — ED Notes (Signed)
To x-ray

## 2012-07-22 NOTE — ED Notes (Signed)
The pt arrived by ptar from home.  He fell on th way to the br.  His legs gave way.  No loc no new pain he has chronic pain.  Diarrhea and weakness for 2 weeks.  Alert wife at the bedside

## 2012-07-22 NOTE — ED Notes (Signed)
Pt is aware of urine specimen. Pt was advised to use the call light to call for tech, if able to use restroom for urine specimen. The tech reported to the nurse.

## 2012-07-22 NOTE — ED Notes (Signed)
The pt has been sleeping since he arrived.  He arrouses with no difficulty

## 2012-08-14 ENCOUNTER — Other Ambulatory Visit: Payer: Self-pay | Admitting: Internal Medicine

## 2012-08-15 NOTE — Telephone Encounter (Signed)
Done hardcopy to robin  

## 2012-08-15 NOTE — Telephone Encounter (Signed)
Faxed hardcopy to [facility] [place]  

## 2012-08-16 ENCOUNTER — Ambulatory Visit: Payer: Self-pay | Admitting: Internal Medicine

## 2012-08-17 ENCOUNTER — Ambulatory Visit: Payer: Self-pay | Admitting: Dietician

## 2012-08-22 ENCOUNTER — Other Ambulatory Visit: Payer: Self-pay

## 2012-09-20 ENCOUNTER — Ambulatory Visit (INDEPENDENT_AMBULATORY_CARE_PROVIDER_SITE_OTHER): Payer: PRIVATE HEALTH INSURANCE | Admitting: Cardiovascular Disease

## 2012-09-20 ENCOUNTER — Encounter: Payer: Self-pay | Admitting: Cardiovascular Disease

## 2012-09-20 VITALS — BP 125/79 | HR 78 | Ht 68.0 in | Wt 224.0 lb

## 2012-09-20 DIAGNOSIS — E785 Hyperlipidemia, unspecified: Secondary | ICD-10-CM

## 2012-09-20 DIAGNOSIS — I255 Ischemic cardiomyopathy: Secondary | ICD-10-CM

## 2012-09-20 DIAGNOSIS — I25810 Atherosclerosis of coronary artery bypass graft(s) without angina pectoris: Secondary | ICD-10-CM

## 2012-09-20 DIAGNOSIS — I2589 Other forms of chronic ischemic heart disease: Secondary | ICD-10-CM

## 2012-09-20 DIAGNOSIS — I10 Essential (primary) hypertension: Secondary | ICD-10-CM

## 2012-09-20 NOTE — Patient Instructions (Addendum)
Your physician wants you to follow-up in:  6 months. You will receive a reminder letter in the mail two months in advance. If you don't receive a letter, please call our office to schedule the follow-up appointment.   

## 2012-09-20 NOTE — Progress Notes (Signed)
History of Present Illness: 72 yo WM with history of CAD s/p 4V CABG 10/15/08, HTN, DM, BPH, Parkinsons disease, dementia with admission to [facility] 10/08/08 with chest pain. He underwent 4 V CABG on 10/15/08. He had 1 single episode of chest pain in August 2010 that was mild. He underwent cardiac catheterization, which showed occlusion of the proximal LAD with faint filling of distal vessel by left-to-left collaterals. There was about 70% stenosis of the diagonal, 70%-80% left circumflex, and no significant stenosis in the right coronary artery, but it was somewhat ectatic and diffusely diseased. Ejection fraction was 50% with mild anteroapical hypokinesis. He then underwent a cardiac MRI to assess viability of his anterior wall, which showed normal left ventricular size with mildly decreased systolic function and an ejection fraction of 53% with mild anteroseptal hypokinesis. 4V CABG went well. He has had some issues with LE edema over the years with good response to Lasix.  LVEF 40% by echo December 2012.   He is here today for follow up. He tells me that he is feeling well. He has no chest pain or SOB. LE edema resolved.   Primary Care Physician: Oliver Barre  Last Lipid Profile:Lipid Panel     Component Value Date/Time   CHOL 230* 06/13/2012 1121   TRIG 813.0 Triglyceride is over 400; calculations on Lipids are invalid.* 06/13/2012 1121   HDL 33.50* 06/13/2012 1121   CHOLHDL 7 06/13/2012 1121   VLDL 162.6* 06/13/2012 1121   LDLCALC  Value: 49        Total Cholesterol/HDL:CHD Risk Coronary Heart Disease Risk Table                     Men   Women  1/2 Average Risk   3.4   3.3  Average Risk       5.0   4.4  2 X Average Risk   9.6   7.1  3 X Average Risk  23.4   11.0        Use the calculated Patient Ratio above and the CHD Risk Table to determine the patient's CHD Risk.        ATP III CLASSIFICATION (LDL):  <100     mg/dL   Optimal  960-454  mg/dL   Near or Above                    Optimal  130-159  mg/dL    Borderline  098-119  mg/dL   High  >147     mg/dL   Very High 09/15/5619 3086     Past Medical History  Diagnosis Date  . CAD (coronary artery disease)     s/p 4V CABG 10/15/08; EF is 50%  . Obesity   . BPH (benign prostatic hypertrophy)   . Edema   . Left ventricular systolic dysfunction     EF is 40% per echo December 2012  . AMI, INFERIOR WALL 09/30/2008    Qualifier: Diagnosis of  By: Sherral Hammers, RN, BSN, Melanie    . CAD, ARTERY BYPASS GRAFT 11/18/2008    Qualifier: Diagnosis of  By: Clifton James, MD, Cristal Deer    . 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 UNIVERSAL ULCERATIVE COLITIS 07/31/2007    Qualifier:  Diagnosis of  By: Arlyce Dice MD, Barbette Hair   . Ischemic cardiomyopathy 05/05/2011  . DM 09/01/2008    Qualifier: Diagnosis of  By: Kem Parkinson    . OBSTRUCTIVE SLEEP APNEA 09/16/2008    Qualifier: Diagnosis of  By: Shelle Iron MD, Maree Krabbe   . HYPERTENSION 09/01/2008    Qualifier: Diagnosis of  By: Kem Parkinson    . Hyperlipidemia 05/18/2010  . Gout 09/23/2011  . Bipolar affective disorder 09/23/2011  . TIA (transient ischemic attack) 09/23/2011  . Chronic pain 09/23/2011  . DNR (do not resuscitate) 09/23/2011    Per July 2011  admission  . CVA (cerebral infarction) 09/23/2011    Small left occipital  . 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 History of alcohol abuse 09/23/2011  . Blood in stool   . Depression   . Urine incontinence   . Stroke     "mini strokes"  . Incontinence of urine   . Neuromuscular disorder     Diabetic neuropathy  . [facility] spotted fever   . Benign neoplasm of colon 03/2011    Adenomatous polyps    Past Surgical History  Procedure Laterality Date  . Coronary artery bypass graft      Median sternotomy,  extracorporeal circulation,  coronary artery bypass graft surgery x4 using a sequential left internal   mammary artery graft to the mid and distal left anterior descending, a   saphenous vein graft to diagonal branch of the left anterior descending,   and a saphenous vein graft to the obtuse marginal branch of left   circumflex coronary artery.    . Cardiac catheterization      Triple-vessel coronary artery disease. Low-normal left ventricular systolic function with mild   anteroapical wall motion abnormality.   . Tonsilectomy, adenoidectomy, bilateral myringotomy and tubes  1946  . Multiple extractions with alveoloplasty  12/01/2011    Procedure: MULTIPLE EXTRACION WITH ALVEOLOPLASTY;  Surgeon: Charlynne Pander, DDS;  Location: [facility] OR;  Service: Oral Surgery;  Laterality: N/A;  Extraction of tooth #'s 3,4,5,6,7,8,9,10,11,12,13,14,15,16,17,20,21,22,23,24,25,26,27,28,29,30,31,32 with alveoloplasty and bilateral mandibular lingual tori  . Flexible sigmoidoscopy  01/17/2012    Procedure: FLEXIBLE SIGMOIDOSCOPY;  Surgeon: Hart Carwin, MD;  Location: [facility];  Service: Endoscopy;  Laterality: N/A;    Current Outpatient Prescriptions  Medication Sig Dispense Refill  . allopurinol (ZYLOPRIM) 300 MG tablet Take 1 tablet (300 mg total) by mouth daily.  30 tablet  11  . Alum & Mag Hydroxide-Simeth (MAGIC MOUTHWASH) SOLN Take 10 mLs by mouth 2 (two) times daily.  150 mL  0  . aspirin 81 MG tablet Take 81 mg by mouth daily.        . Blood Glucose Monitoring Suppl (ONE TOUCH ULTRA SYSTEM KIT) W/DEVICE KIT 1 kit by Does not apply route once.  1 each  0  . carvedilol (COREG) 6.25 MG tablet Take 6.25 mg by mouth 2 (two) times daily with a meal.      . citalopram (CELEXA) 20 MG tablet Take 20 mg by mouth every morning.      . clonazePAM (KLONOPIN) 1 MG tablet Take 1 mg by mouth 2 (two) times daily as needed for anxiety.      . fenofibrate 160 MG tablet Take 1 tablet (160 mg total) by mouth daily.  90 tablet   3  . finasteride (PROSCAR) 5 MG tablet Take 5 mg by mouth every morning.      . furosemide (LASIX) 40 MG tablet Take 1 tablet (40  mg total) by mouth daily.  90 tablet  3  . glucose blood test strip Use as instructed  200 each  12  . glucose blood test strip Use as instructed  200 each  12  . HYDROcodone-acetaminophen (NORCO) 10-325 MG per tablet TAKE ONE TABLET BY MOUTH EVERY 6 HOURS AS NEEDED FOR PAIN  120 tablet  2  . insulin NPH-regular (HUMULIN 70/30) (70-30) 100 UNIT/ML injection Inject 80 units 30 minutes before breakfast and dinner  30 mL  3  . Insulin Syringe-Needle U-100 (INSULIN SYRINGE .5CC/30GX5/16") 30G X 5/16" 0.5 ML MISC       . loperamide (IMODIUM) 2 MG capsule Take 2 mg by mouth as needed for diarrhea or loose stools.      . mesalamine (LIALDA) 1.2 G EC tablet Take 1.2 g by mouth 3 (three) times daily.      [name] Kitchen nystatin cream (MYCOSTATIN) Apply 1 application topically 2 (two) times daily as needed for dry skin.      [name] Kitchen simvastatin (ZOCOR) 40 MG tablet Take 40 mg by mouth at bedtime.      . solifenacin (VESICARE) 10 MG tablet Take 10 mg by mouth daily.      . Syringe/Needle, Disp, (B-D ECLIPSE SYRINGE) 30G X 1/2" 1 ML MISC Use twice a day as advised  100 each  3  . tamsulosin (FLOMAX) 0.4 MG CAPS TAKE 1 CAPSULE BY MOUTH EVERY DAY  30 capsule  11  . topiramate (TOPAMAX) 200 MG tablet Take 200 mg by mouth 2 (two) times daily.       No current facility-administered medications for this visit.    Allergies  Allergen Reactions  . Dexedrine [Dextroamphetamine Sulfate Er]     agitation  . Oxycodone     nausea    History   Social History  . Marital Status: Married    Spouse Name: N/A    Number of Children: 2  . Years of Education: 12   Occupational History  . retired    Social History Main Topics  . Smoking status: Former Smoker    Quit date: 01/18/2008  . Smokeless tobacco: Never Used  . Alcohol Use: No  . Drug Use: No  . Sexual Activity: No   Other Topics  Concern  . Not on file   Social History Narrative   Patient has been married for 51 years.   Patient presents with his wife today.    Patient has 2 grown children and lives in [place] and [place] areas respectively.   Patient is a nonsmoker, nondrinker at this time.    Family History  Problem Relation Age of Onset  . Diabetes Father   . Heart disease Father   . Heart attack Mother   . Aortic aneurysm Mother   . Alcohol abuse Other   . Arthritis Other   . Hyperlipidemia Other   . Heart disease Other   . Stroke Other   . Hypertension Other   . Diabetes Other   . Mental illness Other   . Alcohol abuse Other   . Arthritis Other   . Heart disease Other   . Mental illness Other   . Diabetes Other     Review of Systems:  As stated in the HPI and otherwise negative.   BP 125/79  Pulse 78  Ht 5\' 8"  (1.727 m)  Wt 224 lb (101.606 kg)  BMI 34.07 kg/m2  Physical Examination: General: Well developed, well nourished, NAD HEENT: OP clear, mucus membranes  moist SKIN: warm, dry. No rashes. Neuro: No focal deficits Musculoskeletal: Muscle strength 5/5 all ext Psychiatric: Mood and affect normal Neck: No JVD, no carotid bruits, no thyromegaly, no lymphadenopathy. Lungs:Clear bilaterally, no wheezes, rhonci, crackles Cardiovascular: Regular rate and rhythm. No murmurs, gallops or rubs. Abdomen:Soft. Bowel sounds present. Non-tender.  Extremities: No lower extremity edema. Pulses are 2 + in the bilateral DP/PT.  Assessment and Plan:   1. CAD: He is on good medical therapy. No chest pain or SOB. No changes today.   2. HYPERTENSION: BP well controlled.   3. Ischemic cardiomyopathy: His LV function is mildly reduced. Continue medical therapy.   4. Hyperlipidemia: He is on a statin.

## 2012-11-22 ENCOUNTER — Other Ambulatory Visit: Payer: Self-pay

## 2013-04-15 ENCOUNTER — Ambulatory Visit: Payer: Self-pay | Admitting: Cardiovascular Disease

## 2013-05-17 ENCOUNTER — Encounter: Payer: Self-pay | Admitting: Cardiovascular Disease

## 2013-05-17 ENCOUNTER — Ambulatory Visit (INDEPENDENT_AMBULATORY_CARE_PROVIDER_SITE_OTHER): Payer: PRIVATE HEALTH INSURANCE | Admitting: Cardiovascular Disease

## 2013-05-17 VITALS — BP 102/55 | HR 77 | Ht 68.0 in | Wt 181.0 lb

## 2013-05-17 DIAGNOSIS — I25810 Atherosclerosis of coronary artery bypass graft(s) without angina pectoris: Secondary | ICD-10-CM

## 2013-05-17 DIAGNOSIS — E785 Hyperlipidemia, unspecified: Secondary | ICD-10-CM

## 2013-05-17 DIAGNOSIS — I2510 Atherosclerotic heart disease of native coronary artery without angina pectoris: Secondary | ICD-10-CM

## 2013-05-17 DIAGNOSIS — I10 Essential (primary) hypertension: Secondary | ICD-10-CM

## 2013-05-17 DIAGNOSIS — I2589 Other forms of chronic ischemic heart disease: Secondary | ICD-10-CM

## 2013-05-17 DIAGNOSIS — I255 Ischemic cardiomyopathy: Secondary | ICD-10-CM

## 2013-05-17 NOTE — Progress Notes (Signed)
   History of Present Illness: 72 yo WM with history of CAD s/p 4V CABG 10/15/08, HTN, DM, BPH, Parkinsons disease, dementia with admission to [facility] 10/08/08 with chest pain. He underwent 4 V CABG on 10/15/08. He had 1 single episode of chest pain in August 2010 that was mild. He underwent cardiac catheterization, which showed occlusion of the proximal LAD with faint filling of distal vessel by left-to-left collaterals. There was about 70% stenosis of the diagonal, 70%-80% left circumflex, and no significant stenosis in the right coronary artery, but it was somewhat ectatic and diffusely diseased. Ejection fraction was 50% with mild anteroapical hypokinesis. He then underwent a cardiac MRI to assess viability of his anterior wall, which showed normal left ventricular size with mildly decreased systolic function and an ejection fraction of 53% with mild anteroseptal hypokinesis. 4V CABG went well. He has had some issues with LE edema over the years with good response to Lasix.  LVEF 40% by echo December 2012. He had low blood pressure September 2014 and was admitted to [place].   He is here today for follow up. He tells me that he is feeling well. He has no chest pain or SOB. LE edema resolved. He is inactive and confined to a wheelchair most of the day. He is here today with his family who states his memory issues are more progressive.   Primary Care Physician: James John   Past Medical History  Diagnosis Date  . CAD (coronary artery disease)     s/p 4V CABG 10/15/08; EF is 50%  . Obesity   . BPH (benign prostatic hypertrophy)   . Edema   . Left ventricular systolic dysfunction     EF is 40% per echo December 2012  . AMI, INFERIOR WALL 09/30/2008    Qualifier: Diagnosis of  By: Robbins, RN, BSN, Melanie    . CAD, ARTERY BYPASS GRAFT 11/18/2008    Qualifier: Diagnosis of  By: , MD,     . Other specified forms of chronic ischemic heart disease 05/19/2009    Centricity Description:  CARDIOMYOPATHY, ISCHEMIC Qualifier: Diagnosis of  By: , MD,    Centricity Description: OTHER SPEC FORMS CHRONIC ISCHEMIC HEART DISEASE Qualifier: Diagnosis of  By: Graham, Lela    . UNIVERSAL ULCERATIVE COLITIS 07/31/2007    Qualifier: Diagnosis of  By: Kaplan MD, Robert D   . Ischemic cardiomyopathy 05/05/2011  . DM 09/01/2008    Qualifier: Diagnosis of  By: Barnes, Kimalexis    . OBSTRUCTIVE SLEEP APNEA 09/16/2008    Qualifier: Diagnosis of  By: Clance MD, Keith M   . HYPERTENSION 09/01/2008    Qualifier: Diagnosis of  By: Barnes, Kimalexis    . Hyperlipidemia 05/18/2010  . Gout 09/23/2011  . Bipolar affective disorder 09/23/2011  . TIA (transient ischemic attack) 09/23/2011  . Chronic pain 09/23/2011  . DNR (do not resuscitate) 09/23/2011    Per July 2011 [place] admission  . CVA (cerebral infarction) 09/23/2011    Small left occipital  . 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 History of alcohol abuse 09/23/2011  . Blood in stool   . Depression   . Urine incontinence   . Stroke     "mini strokes"  . Incontinence of urine   . Neuromuscular disorder     Diabetic neuropathy  . [place] spotted fever   . Benign neoplasm of colon 03/2011    Adenomatous polyps    Past Surgical History  Procedure Laterality Date  . Coronary artery   bypass graft      Median sternotomy, extracorporeal circulation,  coronary artery bypass graft surgery x4 using a sequential left internal   mammary artery graft to the mid and distal left anterior descending, a   saphenous vein graft to diagonal branch of the left anterior descending,   and a saphenous vein graft to the obtuse marginal branch of left   circumflex coronary artery.    . Cardiac catheterization      Triple-vessel coronary artery disease. Low-normal left ventricular systolic function with mild   anteroapical wall motion abnormality.   . Tonsilectomy, adenoidectomy, bilateral myringotomy and tubes  1946  . Multiple extractions with alveoloplasty   12/01/2011    Procedure: MULTIPLE EXTRACION WITH ALVEOLOPLASTY;  Surgeon: Lenn Cal, DDS;  Location: [place];  Service: Oral Surgery;  Laterality: N/A;  Extraction of tooth #'s 3,4,5,6,7,8,9,10,11,12,13,14,15,16,17,20,21,22,23,24,25,26,27,28,29,30,31,32 with alveoloplasty and bilateral mandibular lingual tori  . Flexible sigmoidoscopy  01/17/2012    Procedure: FLEXIBLE SIGMOIDOSCOPY;  Surgeon: Lafayette Dragon, MD;  Location: [facility];  Service: Endoscopy;  Laterality: N/A;    Current Outpatient Prescriptions  Medication Sig Dispense Refill  . allopurinol (ZYLOPRIM) 300 MG tablet Take 1 tablet (300 mg total) by mouth daily.  30 tablet  11  . aspirin 81 MG tablet Take 81 mg by mouth daily.        [name] Kitchen atorvastatin (LIPITOR) 20 MG tablet Take 20 mg by mouth daily.      . Blood Glucose Monitoring Suppl (ONE TOUCH ULTRA SYSTEM KIT) W/DEVICE KIT 1 kit by Does not apply route once.  1 each  0  . carvedilol (COREG) 3.125 MG tablet Take by mouth daily.      . citalopram (CELEXA) 20 MG tablet Take 20 mg by mouth every morning.      . clonazePAM (KLONOPIN) 1 MG tablet Take 1 mg by mouth 2 (two) times daily as needed for anxiety.      . fenofibrate 160 MG tablet Take 160 mg by mouth daily.      . finasteride (PROSCAR) 5 MG tablet Take 5 mg by mouth every morning.      . furosemide (LASIX) 20 MG tablet Take 20 mg by mouth.      [name] Kitchen glucose blood test strip Use as instructed  200 each  12  . glucose blood test strip Use as instructed  200 each  12  . Insulin NPH Isophane & Regular (HUMULIN 70/30 [place]) Inject into the skin. As directed      . LamoTRIgine (LAMICTAL PO) Take by mouth.      . loperamide (IMODIUM) 2 MG capsule Take 2 mg by mouth as needed for diarrhea or loose stools.      . mesalamine (LIALDA) 1.2 G EC tablet Take 1.2 g by mouth 3 (three) times daily.      . nitroGLYCERIN (NITROSTAT) 0.4 MG SL tablet Place 0.4 mg under the tongue every 5 (five) minutes as needed for chest pain.      [name] Kitchen  nystatin cream (MYCOSTATIN) Apply 1 application topically 2 (two) times daily as needed for dry skin.      [name] Kitchen oxybutynin (DITROPAN-XL) 10 MG 24 hr tablet Take 10 mg by mouth at bedtime.      [name] Kitchen oxycodone (OXY-IR) 5 MG capsule Take 5 mg by mouth every 4 (four) hours as needed.      . tamsulosin (FLOMAX) 0.4 MG CAPS TAKE 1 CAPSULE BY MOUTH EVERY DAY  30 capsule  11  .  topiramate (TOPAMAX) 200 MG tablet Take 200 mg by mouth 2 (two) times daily.      . furosemide (LASIX) 40 MG tablet Take 1 tablet (40 mg total) by mouth daily.  90 tablet  3   No current facility-administered medications for this visit.    Allergies  Allergen Reactions  . Dexedrine [Dextroamphetamine Sulfate Er]     agitation  . Oxycodone     nausea    History   Social History  . Marital Status: Married    Spouse Name: N/A    Number of Children: 2  . Years of Education: 12   Occupational History  . retired    Social History Main Topics  . Smoking status: Former Smoker    Quit date: 01/18/2008  . Smokeless tobacco: Never Used  . Alcohol Use: No  . Drug Use: No  . Sexual Activity: No   Other Topics Concern  . Not on file   Social History Narrative   Patient has been married for 51 years.   Patient presents with his wife today.    Patient has 2 grown children and lives in [place] and [place] areas respectively.   Patient is a nonsmoker, nondrinker at this time.    Family History  Problem Relation Age of Onset  . Diabetes Father   . Heart disease Father   . Heart attack Mother   . Aortic aneurysm Mother   . Alcohol abuse Other   . Arthritis Other   . Hyperlipidemia Other   . Heart disease Other   . Stroke Other   . Hypertension Other   . Diabetes Other   . Mental illness Other   . Alcohol abuse Other   . Arthritis Other   . Heart disease Other   . Mental illness Other   . Diabetes Other     Review of Systems:  As stated in the HPI and otherwise negative.   BP 102/55  Pulse 77  Ht 5' 8"  (1.727 m)  Wt 181 lb (82.101 kg)  BMI 27.53 kg/m2  Physical Examination: General: Well developed, well nourished, NAD HEENT: OP clear, mucus membranes moist SKIN: warm, dry. No rashes. Neuro: No focal deficits Musculoskeletal: Muscle strength 5/5 all ext Psychiatric: Mood and affect normal Neck: No JVD, no carotid bruits, no thyromegaly, no lymphadenopathy. Lungs:Clear bilaterally, no wheezes, rhonci, crackles Cardiovascular: Regular rate and rhythm. No murmurs, gallops or rubs. Abdomen:Soft. Bowel sounds present. Non-tender.  Extremities: No lower extremity edema. Pulses are 2 + in the bilateral DP/PT.  EKG: Sinus, rate 84 bpm. LAD, LVH. Old inferior infarct.   Assessment and Plan:   1. CAD: He is on good medical therapy. No chest pain or SOB. No changes today.   2. HYPERTENSION: BP well controlled.   3. Ischemic cardiomyopathy: His LV function is mildly reduced. Continue medical therapy.   4. Hyperlipidemia: He is on a statin.

## 2013-05-17 NOTE — Patient Instructions (Signed)
Your physician wants you to follow-up in:  12 months.  You will receive a reminder letter in the mail two months in advance. If you don't receive a letter, please call our office to schedule the follow-up appointment.   

## 2014-09-03 ENCOUNTER — Non-Acute Institutional Stay (SKILLED_NURSING_FACILITY): Payer: Medicare Other | Admitting: Internal Medicine

## 2014-09-03 ENCOUNTER — Encounter: Payer: Self-pay | Admitting: Internal Medicine

## 2014-09-03 DIAGNOSIS — I509 Heart failure, unspecified: Secondary | ICD-10-CM

## 2014-09-03 DIAGNOSIS — G629 Polyneuropathy, unspecified: Secondary | ICD-10-CM | POA: Diagnosis not present

## 2014-09-03 DIAGNOSIS — I255 Ischemic cardiomyopathy: Secondary | ICD-10-CM

## 2014-09-03 DIAGNOSIS — I110 Hypertensive heart disease with heart failure: Secondary | ICD-10-CM

## 2014-09-03 DIAGNOSIS — G8929 Other chronic pain: Secondary | ICD-10-CM

## 2014-09-03 DIAGNOSIS — F3130 Bipolar disorder, current episode depressed, mild or moderate severity, unspecified: Secondary | ICD-10-CM | POA: Diagnosis not present

## 2014-09-03 DIAGNOSIS — E1159 Type 2 diabetes mellitus with other circulatory complications: Secondary | ICD-10-CM | POA: Diagnosis not present

## 2014-09-03 DIAGNOSIS — I25708 Atherosclerosis of coronary artery bypass graft(s), unspecified, with other forms of angina pectoris: Secondary | ICD-10-CM | POA: Diagnosis not present

## 2014-09-03 DIAGNOSIS — E1151 Type 2 diabetes mellitus with diabetic peripheral angiopathy without gangrene: Secondary | ICD-10-CM

## 2014-09-03 NOTE — Progress Notes (Signed)
MRN: [id number] Name: [name]  Sex: male Age: 74 y.o. DOB: 1940-04-21  [place] #: [facility] Facility/Room:308 Level Of Care: SNF Provider: Inocencio Homes D Emergency Contacts: Extended Emergency Contact Information Primary Emergency Contact: Ramer,[name] Address: [address]          [place], [place] [address] [name] of [place] Phone: [phone] Mobile Phone: [phone] Relation: Spouse  Code Status:   Allergies: Dexedrine and Oxycodone  Chief Complaint  Patient presents with  . New Admit To SNF    HPI: Patient is 74 y.o. male with history of CAD s/p 4V CABG 10/15/08, HTN, DM, BPH, Parkinsons disease, dementia  who is admitted to SNF for residential care. Last hospitalization was in 09/2012 to [facility] for hypotension. Pt has no c/o today except he takes too many pills. While at SNF pt will be followed for bipolar affective dx tx with risperdal and trazodone, for CAD with ASA and prn NTG and DM2 with insulin.  Past Medical History  Diagnosis Date  . CAD (coronary artery disease)     s/p 4V CABG 10/15/08; EF is 50%  . Obesity   . BPH (benign prostatic hypertrophy)   . Edema   . Left ventricular systolic dysfunction     EF is 40% per echo December 2012  . AMI, INFERIOR WALL 09/30/2008    Qualifier: Diagnosis of  By: Unk Lightning, RN, BSN, Melanie    . CAD, ARTERY BYPASS GRAFT 11/18/2008    Qualifier: Diagnosis of  By: Angelena Form, MD, Harrell Gave    . Other specified forms of chronic ischemic heart disease 05/19/2009    Centricity Description: CARDIOMYOPATHY, ISCHEMIC Qualifier: Diagnosis of  By: Angelena Form, MD, Christopher   Centricity Description: OTHER SPEC FORMS CHRONIC ISCHEMIC HEART DISEASE Qualifier: Diagnosis of  By: Shelda Pal    . UNIVERSAL ULCERATIVE COLITIS 07/31/2007    Qualifier: Diagnosis of  By: [name] MD, [name]   . Ischemic cardiomyopathy 05/05/2011  . DM 09/01/2008    Qualifier: Diagnosis of  By: Burnett Kanaris    . OBSTRUCTIVE SLEEP  APNEA 09/16/2008    Qualifier: Diagnosis of  By: [name] MD, [name]   . HYPERTENSION 09/01/2008    Qualifier: Diagnosis of  By: Burnett Kanaris    . Hyperlipidemia 05/18/2010  . Gout 09/23/2011  . Bipolar affective disorder 09/23/2011  . TIA (transient ischemic attack) 09/23/2011  . Chronic pain 09/23/2011  . DNR (do not resuscitate) 09/23/2011    Per July 2011 [facility] admission  . CVA (cerebral infarction) 09/23/2011    Small left occipital  . 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 History of alcohol abuse 09/23/2011  . Blood in stool   . Depression   . Urine incontinence   . Stroke     "mini strokes"  . Incontinence of urine   . Neuromuscular disorder     Diabetic neuropathy  . [facility] spotted fever   . Benign neoplasm of colon 03/2011    Adenomatous polyps    Past Surgical History  Procedure Laterality Date  . Coronary artery bypass graft      Median sternotomy, extracorporeal circulation,  coronary artery bypass graft surgery x4 using a sequential left internal   mammary artery graft to the mid and distal left anterior descending, a   saphenous vein graft to diagonal branch of the left anterior descending,   and a saphenous vein graft to the obtuse marginal branch of left   circumflex coronary artery.    . Cardiac catheterization  Triple-vessel coronary artery disease. Low-normal left ventricular systolic function with mild   anteroapical wall motion abnormality.   . Tonsilectomy, adenoidectomy, bilateral myringotomy and tubes  1946  . Multiple extractions with alveoloplasty  12/01/2011    Procedure: MULTIPLE EXTRACION WITH ALVEOLOPLASTY;  Surgeon: Lenn Cal, DDS;  Location: [facility];  Service: Oral Surgery;  Laterality: N/A;  Extraction of tooth #'s 3,4,5,6,7,8,9,10,11,12,13,14,15,16,17,20,21,22,23,24,25,26,27,28,29,30,31,32 with alveoloplasty and bilateral mandibular lingual tori  . Flexible sigmoidoscopy  01/17/2012    Procedure: FLEXIBLE SIGMOIDOSCOPY;  Surgeon: [place] Dragon, MD;   Location: [facility];  Service: Endoscopy;  Laterality: N/A;      Medication List       This list is accurate as of: 09/03/14 11:59 PM.  Always use your most recent med list.               aspirin 81 MG tablet  Take 81 mg by mouth daily.     clonazePAM 0.5 MG tablet  Commonly known as:  KLONOPIN  Take 0.5 mg by mouth at bedtime.     doxazosin 1 MG tablet  Commonly known as:  CARDURA  Take 1 mg by mouth daily.     gabapentin 100 MG capsule  Commonly known as:  NEURONTIN  Take 100 mg by mouth at bedtime.     guaifenesin 100 MG/5ML syrup  Commonly known as:  ROBITUSSIN  Take 100 mg by mouth every 4 (four) hours as needed for cough.     HUMULIN N KWIKPEN 100 UNIT/ML Kiwkpen  Generic drug:  Insulin NPH (Human) (Isophane)  Inject 2-10 Units into the skin. SSI     insulin glargine 100 UNIT/ML injection  Commonly known as:  LANTUS  Inject 20 Units into the skin 2 (two) times daily.     loperamide 2 MG capsule  Commonly known as:  IMODIUM  Take 2 mg by mouth as needed for diarrhea or loose stools.     nitroGLYCERIN 0.4 MG SL tablet  Commonly known as:  NITROSTAT  Place 0.4 mg under the tongue every 5 (five) minutes as needed for chest pain.     oxycodone 5 MG capsule  Commonly known as:  OXY-IR  Take 5 mg by mouth every 8 (eight) hours.     risperiDONE 0.25 MG tablet  Commonly known as:  RISPERDAL  Take 0.125 mg by mouth 2 (two) times daily. 1/2 tablet BID     traMADol 50 MG tablet  Commonly known as:  ULTRAM  Take 50 mg by mouth every 12 (twelve) hours as needed.     traZODone 50 MG tablet  Commonly known as:  DESYREL  Take 50 mg by mouth at bedtime.        Meds ordered this encounter  Medications  . clonazePAM (KLONOPIN) 0.5 MG tablet    Sig: Take 0.5 mg by mouth at bedtime.  [name] Kitchen doxazosin (CARDURA) 1 MG tablet    Sig: Take 1 mg by mouth daily.  . risperiDONE (RISPERDAL) 0.25 MG tablet    Sig: Take 0.125 mg by mouth 2 (two) times daily. 1/2 tablet BID   . gabapentin (NEURONTIN) 100 MG capsule    Sig: Take 100 mg by mouth at bedtime.  . traZODone (DESYREL) 50 MG tablet    Sig: Take 50 mg by mouth at bedtime.  . insulin glargine (LANTUS) 100 UNIT/ML injection    Sig: Inject 20 Units into the skin 2 (two) times daily.  . Insulin NPH, Human,, Isophane, (HUMULIN N KWIKPEN) 100  UNIT/ML Kiwkpen    Sig: Inject 2-10 Units into the skin. SSI  . traMADol (ULTRAM) 50 MG tablet    Sig: Take 50 mg by mouth every 12 (twelve) hours as needed.  [name] Kitchen guaifenesin (ROBITUSSIN) 100 MG/5ML syrup    Sig: Take 100 mg by mouth every 4 (four) hours as needed for cough.     There is no immunization history on file for this patient.  Social History  Substance Use Topics  . Smoking status: Former Smoker    Quit date: 01/18/2008  . Smokeless tobacco: Never Used  . Alcohol Use: No    Family history is  +DM, MI, AAA  Review of Systems  DATA OBTAINED: from patient - I take too many pills GENERAL:  no fevers, fatigue, appetite changes SKIN: No itching, rash or wounds EYES: No eye pain, redness, discharge EARS: No earache, tinnitus, change in hearing NOSE: No congestion, drainage or bleeding  MOUTH/THROAT: No mouth or tooth pain, No sore throat RESPIRATORY: No cough, wheezing, SOB CARDIAC: No chest pain, palpitations, lower extremity edema  GI: No abdominal pain, No N/V/D or constipation, No heartburn or reflux  GU: No dysuria, frequency or urgency, or incontinence  MUSCULOSKELETAL: No unrelieved bone/joint pain NEUROLOGIC: No headache, dizziness or focal weakness PSYCHIATRIC: No c/o anxiety or sadness   Filed Vitals:   09/03/14 1923  BP: 124/73  Pulse: 70  Temp: 96.7 F (35.9 C)  Resp: 18    SpO2 Readings from Last 1 Encounters:  07/22/12 95%        Physical Exam  GENERAL APPEARANCE: Alert, conversant,  No acute distress.  SKIN: No diaphoresis rash HEAD: Normocephalic, atraumatic  EYES: Conjunctiva/lids clear. Pupils round, reactive.  EOMs intact.  EARS: External exam WNL, canals clear. Hearing grossly normal.  NOSE: No deformity or discharge.  MOUTH/THROAT: Lips w/o lesions  RESPIRATORY: Breathing is even, unlabored. Lung sounds are clear   CARDIOVASCULAR: Heart RRR no murmurs, rubs or gallops. No peripheral edema.   GASTROINTESTINAL: Abdomen is soft, non-tender, not distended w/ normal bowel sounds. GENITOURINARY: Bladder non tender, not distended  MUSCULOSKELETAL: No abnormal joints or musculature NEUROLOGIC:  Cranial nerves 2-12 grossly intact. Moves all extremities  PSYCHIATRIC: Mood and affect appropriate to situation with dementia, no behavioral issues  Patient Active Problem List   Diagnosis Date Noted  . Lower back pain 06/12/2012  . Right hip pain 06/12/2012  . Intertrigo 05/09/2012  . Insomnia 04/19/2012  . General weakness 03/07/2012  . 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 Diarrhea 01/17/2012  . Pyelonephritis 01/15/2012  . Peripheral neuropathy 09/28/2011  . Urinary incontinence 09/28/2011  . Preventative health care 09/23/2011  . Gout 09/23/2011  . Bipolar affective disorder 09/23/2011  . TIA (transient ischemic attack) 09/23/2011  . Chronic pain 09/23/2011  . DNR (do not resuscitate) 09/23/2011  . CVA (cerebral infarction) 09/23/2011  . 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 History of alcohol abuse 09/23/2011  . BPH (benign prostatic hypertrophy)   . Left ventricular systolic dysfunction   . Ischemic cardiomyopathy 05/05/2011  . Edema 01/03/2011  . CAD (coronary artery disease) 05/18/2010  . Hyperlipidemia 05/18/2010  . Other abnormality of urination(788.69) 11/25/2009  . Other specified forms of chronic ischemic heart disease 05/19/2009  . CAD, ARTERY BYPASS GRAFT 11/18/2008  . AMI, INFERIOR WALL 09/30/2008  . OBSTRUCTIVE SLEEP APNEA 09/16/2008  . SINUS TACHYCARDIA 09/02/2008  . Type II or unspecified type diabetes mellitus with peripheral circulatory disorders, uncontrolled(250.72)  09/01/2008  . OBESITY 09/01/2008  . HYPERTENSION 09/01/2008  .  TOBACCO ABUSE 07/31/2007  . 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    CBC    Component Value Date/Time   WBC 6.8 07/22/2012 0440   RBC 4.17* 07/22/2012 0440   HGB 13.2 07/22/2012 0440   HCT 38.1* 07/22/2012 0440   PLT 154 07/22/2012 0440   MCV 91.4 07/22/2012 0440   LYMPHSABS 1.7 07/22/2012 0440   MONOABS 0.5 07/22/2012 0440   EOSABS 0.3 07/22/2012 0440   BASOSABS 0.0 07/22/2012 0440    CMP     Component Value Date/Time   NA 136 07/22/2012 0440   K 3.6 07/22/2012 0440   CL 106 07/22/2012 0440   CO2 22 07/22/2012 0440   GLUCOSE 225* 07/22/2012 0440   BUN 25* 07/22/2012 0440   CREATININE 0.85 07/22/2012 0440   CALCIUM 9.1 07/22/2012 0440   PROT 6.3 07/22/2012 0440   ALBUMIN 2.9* 07/22/2012 0440   AST 17 07/22/2012 0440   ALT 26 07/22/2012 0440   ALKPHOS 45 07/22/2012 0440   BILITOT 0.3 07/22/2012 0440   GFRNONAA 84* 07/22/2012 0440   GFRAA >90 07/22/2012 0440    Lab Results  Component Value Date   HGBA1C 11.2* 06/13/2012     Dg Chest 2 View  07/22/2012   *RADIOLOGY REPORT*  Clinical Data: Weakness.  Extensive cardiac history.  CHEST - 2 VIEW  Comparison: 06/07/2012  Findings: Stable postoperative changes in the mediastinum.  Cardiac enlargement with borderline pulmonary vascularity.  No focal consolidation or airspace disease in the lungs.  No blunting of costophrenic angles.  No pneumothorax.  Mediastinal contours appear intact.  Degenerative changes in the spine.  Calcified and tortuous aorta.  No significant change since previous study.  IMPRESSION: Cardiac enlargement.  No evidence of active pulmonary disease.   Original Report Authenticated By: Lucienne Capers, M.D.   Ct Head Wo Contrast  07/22/2012   *RADIOLOGY REPORT*  Clinical Data: Injury after fall.  Weakness in the legs.  Diarrhea and weakness for 2 weeks.  CT HEAD WITHOUT CONTRAST  Technique:  Contiguous axial images were obtained from the base  of the skull through the vertex without contrast.  Comparison: CT head 08/02/2009.  MRI brain 08/03/2009  Findings: Diffuse cerebral atrophy.  Mild ventricular dilatation consistent with central atrophy.  Low attenuation changes in the deep white matter consistent with small vessel ischemic change. Low attenuation change consistent with small vessel ischemia also suggested in the pons.  Old area of encephalomalacia in the left posterior occipital region suggesting old infarct.  No significant change in the appearance of the intracranial contents since the previous study.  No mass effect or midline shift.  No abnormal extra-axial fluid collections.  Gray-white matter junctions are distinct.  Basal cisterns are not effaced.  No evidence of acute intracranial hemorrhage.  No depressed skull fractures.  Visualized paranasal sinuses and mastoid air cells are not opacified. Vascular calcifications.  IMPRESSION: No acute intracranial abnormalities.  Diffuse cerebral atrophy. Old encephalomalacia in the left occipital region.   Original Report Authenticated By: Lucienne Capers, M.D.    Not all labs, radiology exams or other studies done during hospitalization come through on my EPIC note; however they are reviewed by me.    Assessment and Plan  No problem-specific assessment & plan notes found for this encounter.   [name], MD

## 2014-09-05 NOTE — Assessment & Plan Note (Signed)
Controlled; Plan - cont cardura 1 mg daily

## 2014-09-05 NOTE — Assessment & Plan Note (Signed)
Appears stable ; Plan - cont risperdal,trazodone, klonopin

## 2014-09-05 NOTE — Assessment & Plan Note (Addendum)
Pt came in on oxycodone 5 mg q6 scheduled for neuropathic leg pain; Plan - I decreased this to oxycodone 5 mg q8; I would like to wean this off completely and inc neurontin but pt counts his pills so will have to go slowly; Pt has tramadol prn also

## 2014-09-05 NOTE — Assessment & Plan Note (Signed)
Plan - will order baseline labs, have one fro 3 years ag o , A1c of 7.9  and will cont current tx of Lantus 20 u BID with SSI coverage

## 2014-09-05 NOTE — Assessment & Plan Note (Signed)
Stable; on cardura for BP only and ASA with prn NTG SL; Plan -cont current meds

## 2014-09-05 NOTE — Assessment & Plan Note (Signed)
Last known EF - 40%, pt on no meds by choice; Plan - cont ASA

## 2014-09-05 NOTE — Assessment & Plan Note (Signed)
Stable;on no meds ex ASA for prophylaxis;Plan - cont ASA 81 mg daily

## 2014-09-05 NOTE — Assessment & Plan Note (Signed)
2/2 DM ;Plan - continue neurontin 100 mg q HS

## 2014-12-03 ENCOUNTER — Non-Acute Institutional Stay (SKILLED_NURSING_FACILITY): Payer: Medicare Other | Admitting: Internal Medicine

## 2014-12-03 ENCOUNTER — Encounter: Payer: Self-pay | Admitting: Internal Medicine

## 2014-12-03 DIAGNOSIS — R001 Bradycardia, unspecified: Secondary | ICD-10-CM | POA: Insufficient documentation

## 2014-12-03 NOTE — Progress Notes (Signed)
MRN: [id number] Name: [name]  Sex: male Age: 74 y.o. DOB: 09-26-1940  [facility] #: [facility] Facility/Room:308 Level Of Care: SNF Provider: Inocencio Homes D Emergency Contacts: Extended Emergency Contact Information Primary Emergency Contact: Olarte,[name] Address: [address]          [place], [place] [address] [name] of [place] Phone: [phone] Mobile Phone: [phone] Relation: Spouse Secondary Emergency Contact: Aleen Sells [place] of [place] Mobile Phone: [phone] Relation: Daughter  Code Status: DNR  Allergies: Dexedrine and Oxycodone  Chief Complaint  Patient presents with  . Acute Visit    HPI: Patient is 74 y.o. male with CAD, s/p CABG 2010. CHF , EF 53%, DM2, HLD who I am seeing acutely for possible heart block. i am told today that on 11/10 pt had an episode of SOB which improved with O2 and at that time pt had an ECG and labs were drawn. Today pt tells me he has been having these episodes for about 2 weeks, he gets a heavy sensation in his chest and gets SOB, asks for O2 and sx resolve and eventually he is able to get off O2. No radiation, nausea or vomiting. Pt has been awakened at night with this. Pt always sleeps with his head elevated, even prior.  Past Medical History  Diagnosis Date  . CAD (coronary artery disease)     s/p 4V CABG 10/15/08; EF is 50%  . Obesity   . BPH (benign prostatic hypertrophy)   . Edema   . Left ventricular systolic dysfunction     EF is 40% per echo December 2012  . AMI, INFERIOR WALL 09/30/2008    Qualifier: Diagnosis of  By: Unk Lightning, RN, BSN, Melanie    . CAD, ARTERY BYPASS GRAFT 11/18/2008    Qualifier: Diagnosis of  By: Angelena Form, MD, Harrell Gave    . Other specified forms of chronic ischemic heart disease 05/19/2009    Centricity Description: CARDIOMYOPATHY, ISCHEMIC Qualifier: Diagnosis of  By: Angelena Form, MD, Christopher   Centricity Description: OTHER SPEC FORMS CHRONIC ISCHEMIC HEART DISEASE  Qualifier: Diagnosis of  By: Shelda Pal    . UNIVERSAL ULCERATIVE COLITIS 07/31/2007    Qualifier: Diagnosis of  By: [name] MD, [name]   . Ischemic cardiomyopathy 05/05/2011  . DM 09/01/2008    Qualifier: Diagnosis of  By: Burnett Kanaris    . OBSTRUCTIVE SLEEP APNEA 09/16/2008    Qualifier: Diagnosis of  By: [name] MD, [name]   . HYPERTENSION 09/01/2008    Qualifier: Diagnosis of  By: Burnett Kanaris    . Hyperlipidemia 05/18/2010  . Gout 09/23/2011  . Bipolar affective disorder ([place]) 09/23/2011  . TIA (transient ischemic attack) 09/23/2011  . Chronic pain 09/23/2011  . DNR (do not resuscitate) 09/23/2011    Per July 2011 [place] admission  . CVA (cerebral infarction) 09/23/2011    Small left occipital  . 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 History of alcohol abuse 09/23/2011  . Blood in stool   . Depression   . Urine incontinence   . Stroke [facility])     "mini strokes"  . Incontinence of urine   . Neuromuscular disorder ([place])     Diabetic neuropathy  . [facility] spotted fever   . Benign neoplasm of colon 03/2011    Adenomatous polyps    Past Surgical History  Procedure Laterality Date  . Coronary artery bypass graft      Median sternotomy, extracorporeal circulation,  coronary artery bypass graft surgery x4 using a sequential  left internal   mammary artery graft to the mid and distal left anterior descending, a   saphenous vein graft to diagonal branch of the left anterior descending,   and a saphenous vein graft to the obtuse marginal branch of left   circumflex coronary artery.    . Cardiac catheterization      Triple-vessel coronary artery disease. Low-normal left ventricular systolic function with mild   anteroapical wall motion abnormality.   . Tonsilectomy, adenoidectomy, bilateral myringotomy and tubes  1946  . Multiple extractions with alveoloplasty  12/01/2011    Procedure: MULTIPLE EXTRACION WITH ALVEOLOPLASTY;  Surgeon: Lenn Cal, DDS;  Location: [facility];  Service: Oral  Surgery;  Laterality: N/A;  Extraction of tooth #'s 3,4,5,6,7,8,9,10,11,12,13,14,15,16,17,20,21,22,23,24,25,26,27,28,29,30,31,32 with alveoloplasty and bilateral mandibular lingual tori  . Flexible sigmoidoscopy  01/17/2012    Procedure: FLEXIBLE SIGMOIDOSCOPY;  Surgeon: Lafayette Dragon, MD;  Location: [facility];  Service: Endoscopy;  Laterality: N/A;      Medication List       This list is accurate as of: 12/03/14  8:31 PM.  Always use your most recent med list.               aspirin 81 MG tablet  Take 81 mg by mouth daily.     clonazePAM 0.5 MG tablet  Commonly known as:  KLONOPIN  Take 0.5 mg by mouth at bedtime.     doxazosin 1 MG tablet  Commonly known as:  CARDURA  Take 1 mg by mouth daily.     gabapentin 100 MG capsule  Commonly known as:  NEURONTIN  Take 100 mg by mouth at bedtime.     guaifenesin 100 MG/5ML syrup  Commonly known as:  ROBITUSSIN  Take 100 mg by mouth every 4 (four) hours as needed for cough.     HUMULIN N KWIKPEN 100 UNIT/ML Kiwkpen  Generic drug:  Insulin NPH (Human) (Isophane)  Inject 2-10 Units into the skin. SSI     insulin glargine 100 UNIT/ML injection  Commonly known as:  LANTUS  Inject 35 Units into the skin 2 (two) times daily.     lamoTRIgine 100 MG tablet  Commonly known as:  LAMICTAL  Take 50 mg by mouth daily.     loperamide 2 MG capsule  Commonly known as:  IMODIUM  Take 2 mg by mouth as needed for diarrhea or loose stools.     nitroGLYCERIN 0.4 MG SL tablet  Commonly known as:  NITROSTAT  Place 0.4 mg under the tongue every 5 (five) minutes as needed for chest pain.     oxycodone 5 MG capsule  Commonly known as:  OXY-IR  Take 5 mg by mouth every 8 (eight) hours.     traMADol 50 MG tablet  Commonly known as:  ULTRAM  Take 50 mg by mouth every 12 (twelve) hours as needed.     traZODone 50 MG tablet  Commonly known as:  DESYREL  Take 50 mg by mouth at bedtime.        Meds ordered this encounter  Medications   . lamoTRIgine (LAMICTAL) 100 MG tablet    Sig: Take 50 mg by mouth daily.     There is no immunization history on file for this patient.  Social History  Substance Use Topics  . Smoking status: Former Smoker    Quit date: 01/18/2008  . Smokeless tobacco: Never Used  . Alcohol Use: No    Review of Systems  DATA OBTAINED: from patient-  as in HPI GENERAL:  no fevers, fatigue, appetite changes SKIN: No itching, rash HEENT: No complaint RESPIRATORY: No cough, wheezing, SOB CARDIAC: No chest pain, palpitations, +lower extremity edema , not usual for him GI: No abdominal pain, No N/V/D or constipation, No heartburn or reflux  GU: No dysuria, frequency or urgency, or incontinence  MUSCULOSKELETAL: No unrelieved bone/joint pain NEUROLOGIC: No headache, dizziness  PSYCHIATRIC: No overt anxiety or sadness  Filed Vitals:   12/03/14 1242  BP: 132/60  Pulse: 42  Temp: 97.2 F (36.2 C)  Resp: 18    Physical Exam  GENERAL APPEARANCE: Alert, conversant, No acute distress  SKIN: No diaphoresis rash HEENT: Unremarkable RESPIRATORY: Breathing is even, unlabored. Lung sounds are clear   CARDIOVASCULAR: Heart RRR slow  no murmurs, rubs or gallops. 1+ peripheral edema  GASTROINTESTINAL: Abdomen is soft, non-tender, not distended w/ normal bowel sounds.  GENITOURINARY: Bladder non tender, not distended  MUSCULOSKELETAL: No abnormal joints or musculature NEUROLOGIC: Cranial nerves 2-12 grossly intact. Moves all extremities PSYCHIATRIC: Mood and affect appropriate to situation, no behavioral issues  Patient Active Problem List   Diagnosis Date Noted  . Symptomatic bradycardia 12/03/2014  . Lower back pain 06/12/2012  . Right hip pain 06/12/2012  . Intertrigo 05/09/2012  . Insomnia 04/19/2012  . General weakness 03/07/2012  . 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 Diarrhea 01/17/2012  . Pyelonephritis 01/15/2012  . Peripheral neuropathy ([place]) 09/28/2011  .  Urinary incontinence 09/28/2011  . Preventative health care 09/23/2011  . Gout 09/23/2011  . Bipolar affective disorder ([place]) 09/23/2011  . TIA (transient ischemic attack) 09/23/2011  . Chronic pain 09/23/2011  . DNR (do not resuscitate) 09/23/2011  . CVA (cerebral infarction) 09/23/2011  . 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 History of alcohol abuse 09/23/2011  . BPH (benign prostatic hypertrophy)   . Left ventricular systolic dysfunction   . Ischemic cardiomyopathy 05/05/2011  . Edema 01/03/2011  . CAD (coronary artery disease) 05/18/2010  . Hyperlipidemia 05/18/2010  . Other abnormality of urination(788.69) 11/25/2009  . Other specified forms of chronic ischemic heart disease 05/19/2009  . CAD, ARTERY BYPASS GRAFT 11/18/2008  . AMI, INFERIOR WALL 09/30/2008  . OBSTRUCTIVE SLEEP APNEA 09/16/2008  . SINUS TACHYCARDIA 09/02/2008  . DM (diabetes mellitus), type 2 with peripheral vascular complications ([place]) [id number]  . OBESITY 09/01/2008  . Hypertensive heart disease with heart failure ([place]) 09/01/2008  . TOBACCO ABUSE 07/31/2007  . 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    CBC    Component Value Date/Time   WBC 6.8 07/22/2012 0440   RBC 4.17* 07/22/2012 0440   HGB 13.2 07/22/2012 0440   HCT 38.1* 07/22/2012 0440   PLT 154 07/22/2012 0440   MCV 91.4 07/22/2012 0440   LYMPHSABS 1.7 07/22/2012 0440   MONOABS 0.5 07/22/2012 0440   EOSABS 0.3 07/22/2012 0440   BASOSABS 0.0 07/22/2012 0440    CMP     Component Value Date/Time   NA 136 07/22/2012 0440   K 3.6 07/22/2012 0440   CL 106 07/22/2012 0440   CO2 22 07/22/2012 0440   GLUCOSE 225* 07/22/2012 0440   BUN 25* 07/22/2012 0440   CREATININE 0.85 07/22/2012 0440   CALCIUM 9.1 07/22/2012 0440   PROT 6.3 07/22/2012 0440   ALBUMIN 2.9* 07/22/2012 0440   AST 17 07/22/2012 0440   ALT 26 07/22/2012 0440   ALKPHOS 45 07/22/2012 0440   BILITOT 0.3 07/22/2012 0440   GFRNONAA 84* 07/22/2012 0440   GFRAA >90 07/22/2012 0440  Assessment and Plan  Symptomatic bradycardia By hx onset 2 weeks ago; most recent onset this 4 am. Very poor quality ECG from 11/10 appears to have a 2:1 second degree block, VR 33. Labs from the next day with BUN 24, Cr 1.0, K+ 3.8  Bicarb 29, H/H  12.8/36.8  PLT 142. I have ordered an ECG and rhythm strip for now. [facility] nurse made appt with pt that is this Friday.  New ECG and rhythm - Ventricular rate 40, I think the P is buried in the T, still a 2:1 second degree block. There is some question as to whether POA, daughter would want a pacemaker so I think sending them to cardiology appt will work better than hospital today. So far sx are controlled with O2. Will cont to monitor   Time spent 35 min;> 50% of time with patient was spent reviewing records, labs, tests and studies, counseling and developing plan of care  [name], MD

## 2014-12-03 NOTE — Assessment & Plan Note (Addendum)
By hx onset 2 weeks ago; most recent onset this 4 am. Very poor quality ECG from 11/10 appears to have a 2:1 second degree block, VR 33. Labs from the next day with BUN 24, Cr 1.0, K+ 3.8  Bicarb 29, H/H  12.8/36.8  PLT 142. I have ordered an ECG and rhythm strip for now. [facility] nurse made appt with pt that is this Friday.  New ECG and rhythm - Ventricular rate 40, I think the P is buried in the T, still a 2:1 second degree block. There is some question as to whether POA, daughter would want a pacemaker so I think sending them to cardiology appt will work better than hospital today. So far sx are controlled with O2. Will cont to monitor

## 2014-12-05 ENCOUNTER — Encounter: Payer: Self-pay | Admitting: Cardiology

## 2014-12-05 ENCOUNTER — Ambulatory Visit (INDEPENDENT_AMBULATORY_CARE_PROVIDER_SITE_OTHER): Payer: Medicare Other | Admitting: Internal Medicine

## 2014-12-05 VITALS — BP 140/60 | HR 40 | Ht 68.0 in | Wt 235.0 lb

## 2014-12-05 DIAGNOSIS — I442 Atrioventricular block, complete: Secondary | ICD-10-CM

## 2014-12-05 LAB — CBC WITH DIFFERENTIAL/PLATELET
Basophils Absolute: 0.1 10*3/uL (ref 0.0–0.1)
Basophils Relative: 1 % (ref 0–1)
EOS ABS: 0.3 10*3/uL (ref 0.0–0.7)
EOS PCT: 5 % (ref 0–5)
HEMATOCRIT: 40.7 % (ref 39.0–52.0)
Hemoglobin: 13.5 g/dL (ref 13.0–17.0)
LYMPHS ABS: 1.8 10*3/uL (ref 0.7–4.0)
LYMPHS PCT: 27 % (ref 12–46)
MCH: 30.9 pg (ref 26.0–34.0)
MCHC: 33.2 g/dL (ref 30.0–36.0)
MCV: 93.1 fL (ref 78.0–100.0)
MONO ABS: 0.5 10*3/uL (ref 0.1–1.0)
MPV: 10.2 fL (ref 8.6–12.4)
Monocytes Relative: 8 % (ref 3–12)
Neutro Abs: 4 10*3/uL (ref 1.7–7.7)
Neutrophils Relative %: 59 % (ref 43–77)
PLATELETS: 136 10*3/uL — AB (ref 150–400)
RBC: 4.37 MIL/uL (ref 4.22–5.81)
RDW: 14.4 % (ref 11.5–15.5)
WBC: 6.8 10*3/uL (ref 4.0–10.5)

## 2014-12-05 LAB — BASIC METABOLIC PANEL
BUN: 27 mg/dL — AB (ref 7–25)
CO2: 24 mmol/L (ref 20–31)
CREATININE: 1.06 mg/dL (ref 0.70–1.18)
Calcium: 9.1 mg/dL (ref 8.6–10.3)
Chloride: 101 mmol/L (ref 98–110)
GLUCOSE: 242 mg/dL — AB (ref 65–99)
POTASSIUM: 4.1 mmol/L (ref 3.5–5.3)
Sodium: 136 mmol/L (ref 135–146)

## 2014-12-05 NOTE — Progress Notes (Signed)
12/05/2014 [name]   02-08-1940  [id number]  Primary Physician Cathlean Cower, MD Primary Cardiologist: Dr. Angelena Form   Reason for Visit/CC: Abnormal EKG and Bradycardia  HPI:  74 yo WM with history of CAD s/p 4V CABG 10/15/08, ischemic cardiomyopathy, HTN, DM, BPH, Parkinsons disease, and dementia. His last 2D echo in Dec 0000000 showed LV systolic dysfunction with an EF of 40%. He was last seen by Dr. Angelena Form 05/2013 and was noted to be stable from a cardiac standpoint. He is a resident at [facility].  He presents to clinic today as advised by the medical staff at [facility]. There were concerns for 2nd degree heart block and bradycardia with reported rates in the 30s. Per report from telephone encounter, this was seen on an EKG from 11/27/14. Labs were obtained at [facility] which showed BUN 24, Cr 1.0, K+ 3.8 Bicarb 29, H/H 12.8/36.8 PLT 142.  No mention of TSH. [facility] called to schedule an appointment and he presents to clinic today for f/u regarding this.   EKG today shows 2:1 2nd degree heart block. His only symptoms are fatigue and weakness. He denies syncope/ near syncope. He is not on any AVN blocking agents.     Current Outpatient Prescriptions  Medication Sig Dispense Refill  . aspirin 81 MG tablet Take 81 mg by mouth daily.      . clonazePAM (KLONOPIN) 0.5 MG tablet Take 0.5 mg by mouth at bedtime.    [name] Kitchen doxazosin (CARDURA) 1 MG tablet Take 1 mg by mouth daily.    [name] Kitchen gabapentin (NEURONTIN) 100 MG capsule Take 100 mg by mouth at bedtime.    [name] Kitchen guaifenesin (ROBITUSSIN) 100 MG/5ML syrup Take 100 mg by mouth every 4 (four) hours as needed for cough.    . insulin glargine (LANTUS) 100 UNIT/ML injection Inject 35 Units into the skin 2 (two) times daily.     . Insulin NPH, Human,, Isophane, (HUMULIN N KWIKPEN) 100 UNIT/ML Kiwkpen Inject 2-10 Units into the skin. SSI    . lamoTRIgine (LAMICTAL) 100 MG tablet Take 50 mg by mouth daily.    [name] Kitchen loperamide (IMODIUM) 2 MG capsule  Take 2 mg by mouth as needed for diarrhea or loose stools.    . nitroGLYCERIN (NITROSTAT) 0.4 MG SL tablet Place 0.4 mg under the tongue every 5 (five) minutes as needed for chest pain.    [name] Kitchen oxycodone (OXY-IR) 5 MG capsule Take 5 mg by mouth every 8 (eight) hours.     . traMADol (ULTRAM) 50 MG tablet Take 50 mg by mouth every 12 (twelve) hours as needed.    . traZODone (DESYREL) 50 MG tablet Take 50 mg by mouth at bedtime.     No current facility-administered medications for this visit.    Allergies  Allergen Reactions  . Dexedrine [Dextroamphetamine Sulfate Er]     agitation  . Oxycodone     nausea    Social History   Social History  . Marital Status: Married    Spouse Name: N/A  . Number of Children: 2  . Years of Education: 12   Occupational History  . retired    Social History Main Topics  . Smoking status: Former Smoker    Quit date: 01/18/2008  . Smokeless tobacco: Never Used  . Alcohol Use: No  . Drug Use: No  . Sexual Activity: No   Other Topics Concern  . Not on file   Social History Narrative   Patient has been married  for 51 years.   Patient presents with his wife today.    Patient has 2 grown children and lives in [place] and [place] areas respectively.   Patient is a nonsmoker, nondrinker at this time.     Review of Systems: General: negative for chills, fever, night sweats or weight changes.  Cardiovascular: negative for chest pain, dyspnea on exertion, edema, orthopnea, palpitations, paroxysmal nocturnal dyspnea or shortness of breath Dermatological: negative for rash Respiratory: negative for cough or wheezing Urologic: negative for hematuria Abdominal: negative for nausea, vomiting, diarrhea, bright red blood per rectum, melena, or hematemesis Neurologic: negative for visual changes, syncope, or dizziness All other systems reviewed and are otherwise negative except as noted above.    Blood pressure 140/60, pulse 40, height 5\' 8"  (1.727  m), weight 235 lb (106.595 kg).  General appearance: alert, cooperative, no distress and in a wheel chair Neck: no carotid bruit and no JVD Lungs: clear to auscultation bilaterally Heart: bradycardia Extremities: 1+ bilateral pretibial edema Pulses: 2+ and symmetric Skin: warm and dry Neurologic: Grossly normal  EKG 2:1 2nd degree HB. 40 bpm   ASSESSMENT AND PLAN:   1. 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 Lovena Le. He is not on any AVN blocking agents. He will require a PPM. He is stable. His only symptoms are mild weakness. We will plan for PPM by Dr. Lovena Le next week at [facility]. Patient advised to go to ER if he becomes unstable. He and his wife verbalized understanding.   2. CAD: s/p 4V CABG 10/15/08. Denies CP.   3. ICM: Last assessment of EF was in 2012 by 2D echo. EF was 40%. Mild 1+ pretibial edema on exam but otherwise stable.   PLAN  PPM scheduled for 12/12/14 with Dr. Lovena Le.   Lyda Jester PA-C 12/05/2014 2:36 PM  EP Attending  Patient seen and examined. I agree with the findings as noted about by Lyda Jester. He has a regular bradycardia on exam. Lungs are clear. He is not in overt heart failure. He does have symptomatic 2:1 AV block. I have discussed the risks/benefits/goals/expectations of PPM insertion with the patient and he wishes to proceed.   Mikle Bosworth.D.

## 2014-12-05 NOTE — Patient Instructions (Signed)
Medication Instructions:  Your physician recommends that you continue on your current medications as directed. Please refer to the Current Medication list given to you today.   Labwork: Your physician recommends that you have lab work today: bmet/cbc   Testing/Procedures: Your physician has recommended that you have a pacemaker inserted. A pacemaker is a small device that is placed under the skin of your chest or abdomen to help control abnormal heart rhythms. This device uses electrical pulses to prompt the heart to beat at a normal rate. Pacemakers are used to treat heart rhythms that are too slow. Wire (leads) are attached to the pacemaker that goes into the chambers of you heart. This is done in the hospital and usually requires and overnight stay. Please see the instruction sheet given to you today for more information.    Follow-Up: Your physician recommends that you schedule a follow-up appointment in: 10 - 14 from 11/25 in device clinic and 3 months with Dr. Lovena Le   Any Other Special Instructions Will Be Listed Below (If Applicable). Nothing to eat or drink after midnight on night before procedure No medications the morning of procedure 1/2 dose of insulin the pm before procedure Please arrive at the [place] [facility] main entrance of [facility] at 9:30 am on 11/25    If you need a refill on your cardiac medications before your next appointment, please call your pharmacy.

## 2014-12-08 ENCOUNTER — Ambulatory Visit: Payer: Medicaid Other | Admitting: Cardiology

## 2014-12-12 ENCOUNTER — Ambulatory Visit (HOSPITAL_COMMUNITY)
Admission: RE | Admit: 2014-12-12 | Discharge: 2014-12-13 | Payer: Medicare Other | Source: Ambulatory Visit | Attending: Internal Medicine | Admitting: Internal Medicine

## 2014-12-12 ENCOUNTER — Encounter (HOSPITAL_COMMUNITY)
Admission: RE | Disposition: A | Discharge status: Other Acute Inpatient Hospital | Payer: Medicare Other | Source: Ambulatory Visit | Attending: Internal Medicine

## 2014-12-12 DIAGNOSIS — I255 Ischemic cardiomyopathy: Secondary | ICD-10-CM | POA: Insufficient documentation

## 2014-12-12 DIAGNOSIS — G20 Parkinson's disease: Secondary | ICD-10-CM | POA: Diagnosis not present

## 2014-12-12 DIAGNOSIS — G4733 Obstructive sleep apnea (adult) (pediatric): Secondary | ICD-10-CM | POA: Diagnosis not present

## 2014-12-12 DIAGNOSIS — R001 Bradycardia, unspecified: Secondary | ICD-10-CM | POA: Insufficient documentation

## 2014-12-12 DIAGNOSIS — Z87891 Personal history of nicotine dependence: Secondary | ICD-10-CM | POA: Insufficient documentation

## 2014-12-12 DIAGNOSIS — E119 Type 2 diabetes mellitus without complications: Secondary | ICD-10-CM | POA: Insufficient documentation

## 2014-12-12 DIAGNOSIS — Z7982 Long term (current) use of aspirin: Secondary | ICD-10-CM | POA: Insufficient documentation

## 2014-12-12 DIAGNOSIS — I110 Hypertensive heart disease with heart failure: Secondary | ICD-10-CM | POA: Diagnosis present

## 2014-12-12 DIAGNOSIS — Z950 Presence of cardiac pacemaker: Secondary | ICD-10-CM | POA: Diagnosis present

## 2014-12-12 DIAGNOSIS — Z951 Presence of aortocoronary bypass graft: Secondary | ICD-10-CM | POA: Insufficient documentation

## 2014-12-12 DIAGNOSIS — N400 Enlarged prostate without lower urinary tract symptoms: Secondary | ICD-10-CM | POA: Insufficient documentation

## 2014-12-12 DIAGNOSIS — E669 Obesity, unspecified: Secondary | ICD-10-CM | POA: Diagnosis not present

## 2014-12-12 DIAGNOSIS — I2510 Atherosclerotic heart disease of native coronary artery without angina pectoris: Secondary | ICD-10-CM | POA: Diagnosis present

## 2014-12-12 DIAGNOSIS — F0280 Dementia in other diseases classified elsewhere without behavioral disturbance: Secondary | ICD-10-CM | POA: Diagnosis not present

## 2014-12-12 DIAGNOSIS — I441 Atrioventricular block, second degree: Secondary | ICD-10-CM

## 2014-12-12 DIAGNOSIS — Z794 Long term (current) use of insulin: Secondary | ICD-10-CM | POA: Diagnosis not present

## 2014-12-12 DIAGNOSIS — I440 Atrioventricular block, first degree: Secondary | ICD-10-CM | POA: Diagnosis present

## 2014-12-12 DIAGNOSIS — I5022 Chronic systolic (congestive) heart failure: Secondary | ICD-10-CM | POA: Diagnosis present

## 2014-12-12 HISTORY — DX: Atrioventricular block, second degree: I44.1

## 2014-12-12 HISTORY — DX: Presence of cardiac pacemaker: Z95.0

## 2014-12-12 HISTORY — PX: EP IMPLANTABLE DEVICE: SHX172B

## 2014-12-12 LAB — SURGICAL PCR SCREEN
MRSA, PCR: NEGATIVE
STAPHYLOCOCCUS AUREUS: NEGATIVE

## 2014-12-12 LAB — GLUCOSE, CAPILLARY
GLUCOSE-CAPILLARY: 206 mg/dL — AB (ref 65–99)
Glucose-Capillary: 208 mg/dL — ABNORMAL HIGH (ref 65–99)
Glucose-Capillary: 179 mg/dL — ABNORMAL HIGH (ref 65–99)

## 2014-12-12 SURGERY — PACEMAKER IMPLANT

## 2014-12-12 MED ORDER — CEFAZOLIN SODIUM-DEXTROSE 2-3 GM-% IV SOLR
INTRAVENOUS | Status: AC
  Filled 2014-12-12: qty 50

## 2014-12-12 MED ORDER — CEFAZOLIN SODIUM-DEXTROSE 2-3 GM-% IV SOLR
INTRAVENOUS | Status: DC | PRN
  Administered 2014-12-12 (×2): 2 g via INTRAVENOUS

## 2014-12-12 MED ORDER — ASPIRIN 81 MG PO CHEW
81.0000 mg | CHEWABLE_TABLET | Freq: Every day | ORAL | Status: DC
  Filled 2014-12-12: qty 1

## 2014-12-12 MED ORDER — INSULIN LISPRO 100 UNIT/ML (KWIKPEN): 4.0000 [IU] | PEN_INJECTOR | Freq: Four times a day (QID) | SUBCUTANEOUS | Status: DC

## 2014-12-12 MED ORDER — CLONAZEPAM 0.5 MG PO TABS
0.5000 mg | ORAL_TABLET | Freq: Every day | ORAL | Status: DC
  Filled 2014-12-12: qty 1

## 2014-12-12 MED ORDER — CEFAZOLIN SODIUM 1-5 GM-% IV SOLN
1.0000 g | Freq: Four times a day (QID) | INTRAVENOUS | Status: AC
  Administered 2014-12-12 – 2014-12-13 (×3): 1 g via INTRAVENOUS
  Filled 2014-12-12 (×3): qty 50

## 2014-12-12 MED ORDER — SODIUM CHLORIDE 0.9 % IV SOLN
INTRAVENOUS | Status: DC
  Administered 2014-12-12: 11:00:00 via INTRAVENOUS

## 2014-12-12 MED ORDER — LIDOCAINE HCL (PF) 1 % IJ SOLN
INTRAMUSCULAR | Status: AC
  Filled 2014-12-12: qty 30

## 2014-12-12 MED ORDER — MUPIROCIN 2 % EX OINT
TOPICAL_OINTMENT | CUTANEOUS | Status: AC
  Administered 2014-12-12: 1 via NASAL
  Filled 2014-12-12: qty 22

## 2014-12-12 MED ORDER — ACETAMINOPHEN 325 MG PO TABS
325.0000 mg | ORAL_TABLET | ORAL | Status: DC | PRN
  Administered 2014-12-12: 650 mg via ORAL
  Filled 2014-12-12: qty 2

## 2014-12-12 MED ORDER — GENTAMICIN SULFATE 40 MG/ML IJ SOLN: 80.0000 mg | INTRAMUSCULAR | Status: DC

## 2014-12-12 MED ORDER — CHLORHEXIDINE GLUCONATE 4 % EX LIQD: 60.0000 mL | Freq: Once | CUTANEOUS | Status: DC

## 2014-12-12 MED ORDER — INSULIN GLARGINE 100 UNIT/ML ~~LOC~~ SOLN
35.0000 [IU] | Freq: Two times a day (BID) | SUBCUTANEOUS | Status: DC
  Administered 2014-12-12 – 2014-12-13 (×2): 35 [IU] via SUBCUTANEOUS
  Filled 2014-12-12 (×3): qty 0.35

## 2014-12-12 MED ORDER — FENTANYL CITRATE (PF) 100 MCG/2ML IJ SOLN
INTRAMUSCULAR | Status: AC
  Filled 2014-12-12: qty 2

## 2014-12-12 MED ORDER — MIDAZOLAM HCL 5 MG/5ML IJ SOLN
INTRAMUSCULAR | Status: DC | PRN
  Administered 2014-12-12 (×2): 1 mg via INTRAVENOUS

## 2014-12-12 MED ORDER — CEFAZOLIN SODIUM 1-5 GM-% IV SOLN
1.0000 g | Freq: Four times a day (QID) | INTRAVENOUS | Status: DC
  Filled 2014-12-12 (×3): qty 50

## 2014-12-12 MED ORDER — GABAPENTIN 100 MG PO CAPS
100.0000 mg | ORAL_CAPSULE | Freq: Every day | ORAL | Status: DC
  Administered 2014-12-12: 100 mg via ORAL
  Filled 2014-12-12: qty 1

## 2014-12-12 MED ORDER — LIVING WELL WITH DIABETES BOOK
Freq: Once | Status: AC
  Administered 2014-12-12: 20:00:00
  Filled 2014-12-12: qty 1

## 2014-12-12 MED ORDER — CLONAZEPAM 0.5 MG PO TABS
0.5000 mg | ORAL_TABLET | Freq: Three times a day (TID) | ORAL | Status: DC | PRN
  Administered 2014-12-12: 18:00:00 0.5 mg via ORAL

## 2014-12-12 MED ORDER — HEPARIN (PORCINE) IN NACL 2-0.9 UNIT/ML-% IJ SOLN
INTRAMUSCULAR | Status: AC
  Filled 2014-12-12: qty 1000

## 2014-12-12 MED ORDER — MUPIROCIN 2 % EX OINT
TOPICAL_OINTMENT | Freq: Once | CUTANEOUS | Status: AC
  Administered 2014-12-12: 1 via NASAL

## 2014-12-12 MED ORDER — LAMOTRIGINE 25 MG PO TABS
50.0000 mg | ORAL_TABLET | Freq: Every day | ORAL | Status: DC
  Administered 2014-12-12 – 2014-12-13 (×2): 50 mg via ORAL
  Filled 2014-12-12 (×3): qty 2

## 2014-12-12 MED ORDER — FENTANYL CITRATE (PF) 100 MCG/2ML IJ SOLN
INTRAMUSCULAR | Status: DC | PRN
  Administered 2014-12-12 (×2): 12.5 ug via INTRAVENOUS

## 2014-12-12 MED ORDER — TRAZODONE HCL 50 MG PO TABS
50.0000 mg | ORAL_TABLET | Freq: Every evening | ORAL | Status: DC | PRN
  Administered 2014-12-12: 50 mg via ORAL
  Filled 2014-12-12: qty 1

## 2014-12-12 MED ORDER — SODIUM CHLORIDE 0.9 % IR SOLN
Status: AC
  Filled 2014-12-12: qty 2

## 2014-12-12 MED ORDER — DOXAZOSIN MESYLATE 1 MG PO TABS
1.0000 mg | ORAL_TABLET | Freq: Every day | ORAL | Status: DC
  Administered 2014-12-12: 1 mg via ORAL
  Filled 2014-12-12 (×3): qty 1

## 2014-12-12 MED ORDER — GUAIFENESIN 100 MG/5ML PO SYRP: 100.0000 mg | ORAL_SOLUTION | Freq: Four times a day (QID) | ORAL | Status: DC | PRN

## 2014-12-12 MED ORDER — NITROGLYCERIN 0.4 MG SL SUBL: 0.4000 mg | SUBLINGUAL_TABLET | SUBLINGUAL | Status: DC | PRN

## 2014-12-12 MED ORDER — LIDOCAINE HCL (PF) 1 % IJ SOLN
INTRAMUSCULAR | Status: DC | PRN
  Administered 2014-12-12: 30 mL via SUBCUTANEOUS
  Administered 2014-12-12: 60 mL via SUBCUTANEOUS

## 2014-12-12 MED ORDER — CEFAZOLIN SODIUM-DEXTROSE 2-3 GM-% IV SOLR: 2.0000 g | INTRAVENOUS | Status: DC

## 2014-12-12 MED ORDER — LOPERAMIDE HCL 2 MG PO CAPS: 4.0000 mg | ORAL_CAPSULE | ORAL | Status: DC | PRN

## 2014-12-12 MED ORDER — YOU HAVE A PACEMAKER BOOK
Freq: Once | Status: AC
  Administered 2014-12-12: 20:00:00
  Filled 2014-12-12: qty 1

## 2014-12-12 MED ORDER — ONDANSETRON HCL 4 MG/2ML IJ SOLN: 4.0000 mg | Freq: Four times a day (QID) | INTRAMUSCULAR | Status: DC | PRN

## 2014-12-12 MED ORDER — ACETAMINOPHEN 325 MG PO TABS: 650.0000 mg | ORAL_TABLET | ORAL | Status: DC | PRN

## 2014-12-12 MED ORDER — MIDAZOLAM HCL 5 MG/5ML IJ SOLN
INTRAMUSCULAR | Status: AC
  Filled 2014-12-12: qty 5

## 2014-12-12 SURGICAL SUPPLY — 9 items
CABLE SURGICAL S-101-97-12 (CABLE) ×2 IMPLANT
HOVERMATT SINGLE USE (MISCELLANEOUS) ×2 IMPLANT
LEAD TENDRIL SDX 2088TC-52CM (Lead) ×2 IMPLANT
LEAD TENDRIL SDX 2088TC-58CM (Lead) ×2 IMPLANT
PAD DEFIB LIFELINK (PAD) ×2 IMPLANT
PPM ASSURITY DR PM2240 (Pacemaker) ×2 IMPLANT
SHEATH CLASSIC 7F (SHEATH) ×4 IMPLANT
SHEATH CLASSIC 9F (SHEATH) ×2 IMPLANT
TRAY PACEMAKER INSERTION (PACKS) ×2 IMPLANT

## 2014-12-12 NOTE — H&P (Signed)
12/05/2014 [name]  Jan 22, 1940  [id number]  Primary Physician Cathlean Cower, MD Primary Cardiologist: Dr. Angelena Form   Reason for Visit/CC: Abnormal EKG and Bradycardia  HPI: 74 yo WM with history of CAD s/p 4V CABG 10/15/08, ischemic cardiomyopathy, HTN, DM, BPH, Parkinsons disease, and dementia. His last 2D echo in Dec 0000000 showed LV systolic dysfunction with an EF of 40%. He was last seen by Dr. Angelena Form 05/2013 and was noted to be stable from a cardiac standpoint. He is a resident at [facility].  He presents to clinic today as advised by the medical staff at [facility]. There were concerns for 2nd degree heart block and bradycardia with reported rates in the 30s. Per report from telephone encounter, this was seen on an EKG from 11/27/14. Labs were obtained at [facility] which showed BUN 24, Cr 1.0, K+ 3.8 Bicarb 29, H/H 12.8/36.8 PLT 142. No mention of TSH. [name] called to schedule an appointment and he presents to clinic today for f/u regarding this.   EKG today shows 2:1 2nd degree heart block. His only symptoms are fatigue and weakness. He denies syncope/ near syncope. He is not on any AVN blocking agents.     Current Outpatient Prescriptions  Medication Sig Dispense Refill  . aspirin 81 MG tablet Take 81 mg by mouth daily.     . clonazePAM (KLONOPIN) 0.5 MG tablet Take 0.5 mg by mouth at bedtime.    [name] Kitchen doxazosin (CARDURA) 1 MG tablet Take 1 mg by mouth daily.    [name] Kitchen gabapentin (NEURONTIN) 100 MG capsule Take 100 mg by mouth at bedtime.    [name] Kitchen guaifenesin (ROBITUSSIN) 100 MG/5ML syrup Take 100 mg by mouth every 4 (four) hours as needed for cough.    . insulin glargine (LANTUS) 100 UNIT/ML injection Inject 35 Units into the skin 2 (two) times daily.     . Insulin NPH, Human,, Isophane, (HUMULIN N KWIKPEN) 100 UNIT/ML Kiwkpen Inject 2-10 Units into the skin. SSI    . lamoTRIgine (LAMICTAL) 100 MG tablet Take 50 mg by mouth  daily.    [name] Kitchen loperamide (IMODIUM) 2 MG capsule Take 2 mg by mouth as needed for diarrhea or loose stools.    . nitroGLYCERIN (NITROSTAT) 0.4 MG SL tablet Place 0.4 mg under the tongue every 5 (five) minutes as needed for chest pain.    [name] Kitchen oxycodone (OXY-IR) 5 MG capsule Take 5 mg by mouth every 8 (eight) hours.     . traMADol (ULTRAM) 50 MG tablet Take 50 mg by mouth every 12 (twelve) hours as needed.    . traZODone (DESYREL) 50 MG tablet Take 50 mg by mouth at bedtime.     No current facility-administered medications for this visit.    Allergies  Allergen Reactions  . Dexedrine [Dextroamphetamine Sulfate Er]     agitation  . Oxycodone     nausea    Social History   Social History  . Marital Status: Married    Spouse Name: N/A  . Number of Children: 2  . Years of Education: 12   Occupational History  . retired    Social History Main Topics  . Smoking status: Former Smoker    Quit date: 01/18/2008  . Smokeless tobacco: Never Used  . Alcohol Use: No  . Drug Use: No  . Sexual Activity: No   Other Topics Concern  . Not on file   Social History Narrative   Patient has been married for 51 years.  Patient presents with his wife today.    Patient has 2 grown children and lives in [place] and [place] areas respectively.   Patient is a nonsmoker, nondrinker at this time.     Review of Systems: General: negative for chills, fever, night sweats or weight changes.  Cardiovascular: negative for chest pain, dyspnea on exertion, edema, orthopnea, palpitations, paroxysmal nocturnal dyspnea or shortness of breath Dermatological: negative for rash Respiratory: negative for cough or wheezing Urologic: negative for hematuria Abdominal: negative for nausea, vomiting, diarrhea, bright red blood per rectum, melena, or hematemesis Neurologic: negative for visual changes,  syncope, or dizziness All other systems reviewed and are otherwise negative except as noted above.    Blood pressure 140/60, pulse 40, height 5\' 8"  (1.727 m), weight 235 lb (106.595 kg).  General appearance: alert, cooperative, no distress and in a wheel chair Neck: no carotid bruit and no JVD Lungs: clear to auscultation bilaterally Heart: bradycardia Extremities: 1+ bilateral pretibial edema Pulses: 2+ and symmetric Skin: warm and dry Neurologic: Grossly normal  EKG 2:1 2nd degree HB. 40 bpm   ASSESSMENT AND PLAN:   1. 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 Lovena Le. He is not on any AVN blocking agents. He will require a PPM. He is stable. His only symptoms are mild weakness. We will plan for PPM by Dr. Lovena Le next week at [facility]. Patient advised to go to ER if he becomes unstable. He and his wife verbalized understanding.   2. CAD: s/p 4V CABG 10/15/08. Denies CP.   3. ICM: Last assessment of EF was in 2012 by 2D echo. EF was 40%. Mild 1+ pretibial edema on exam but otherwise stable.   PLAN PPM scheduled for 12/12/14 with Dr. Lovena Le.   Lyda Jester PA-C 12/05/2014 2:36 PM  EP Attending  Patient seen and examined. I agree with the findings as noted about by Lyda Jester. He has a regular bradycardia on exam. Lungs are clear. He is not in overt heart failure. He does have symptomatic 2:1 AV block. I have discussed the risks/benefits/goals/expectations of PPM insertion with the patient and he wishes to proceed.   [name]       EP Attending  Patient seen and examined. Agree with the findings as noted above. Since I saw the patient last week, no change in the history, exam, assessment and plan. Will proceed with DDD PM insertion.  [name].D.

## 2014-12-12 NOTE — NC FL2 (Signed)
[place] LEVEL OF CARE SCREENING TOOL     IDENTIFICATION  Patient Name: [name] Birthdate: 1940/12/10 Sex: male Admission Date (Current Location): 12/12/2014  [place] and [place] Number: [name] [id number] [place] and Address:  [facility], [place] [address] [place], [place] [address]      Provider Number: [id number]  Attending Physician Name and Address:  [name], MD  Relative Name and Phone Number:  [name], wife.  [phone].    Current Level of Care: Hospital Recommended Level of Care: Nursing Facility Prior Approval Number:    Date Approved/Denied:   PASRR Number: [id number] (Effective 01/16/2012)  Discharge Plan: SNF    Current Diagnoses: Patient Active Problem List   Diagnosis Date Noted  . Mobitz type 2 second degree atrioventricular block 12/12/2014  . Pacemaker 12/12/2014  . Symptomatic bradycardia 12/03/2014  . Lower back pain 06/12/2012  . Right hip pain 06/12/2012  . Intertrigo 05/09/2012  . Insomnia 04/19/2012  . General weakness 03/07/2012  . Ulcerative (chronic) proctosigmoiditis ([place]) 01/17/2012  . Diarrhea 01/17/2012  . Pyelonephritis 01/15/2012  . Peripheral neuropathy ([place]) 09/28/2011  . Urinary incontinence 09/28/2011  . Preventative health care 09/23/2011  . Gout 09/23/2011  . Bipolar affective disorder ([place]) 09/23/2011  . TIA (transient ischemic attack) 09/23/2011  . Chronic pain 09/23/2011  . DNR (do not resuscitate) 09/23/2011  . CVA (cerebral infarction) 09/23/2011  . 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 History of alcohol abuse 09/23/2011  . BPH (benign prostatic hypertrophy)   . Left ventricular systolic dysfunction   . Ischemic cardiomyopathy 05/05/2011  . Edema 01/03/2011  . CAD (coronary artery disease) 05/18/2010  . Hyperlipidemia 05/18/2010  . Other abnormality of urination(788.69) 11/25/2009  . Other specified forms of chronic ischemic heart disease 05/19/2009  .  CAD, ARTERY BYPASS GRAFT 11/18/2008  . AMI, INFERIOR WALL 09/30/2008  . OBSTRUCTIVE SLEEP APNEA 09/16/2008  . SINUS TACHYCARDIA 09/02/2008  . DM (diabetes mellitus), type 2 with peripheral vascular complications ([place]) [id number]  . OBESITY 09/01/2008  . Hypertensive heart disease with heart failure ([place]) 09/01/2008  . TOBACCO ABUSE 07/31/2007  . 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 Normal          AMBULATORY STATUS RESPIRATION    Supervision limited Normal      Personal Care Assistance Level of Assistance  Bathing, Feeding, Dressing Bathing Assistance: Limited assistance Feeding assistance: Independent Dressing Assistance: Limited assistance      Functional Limitations Info                SPECIAL CARE FACTORS FREQUENCY                      Additional Factors Info  Code Status, Allergies Code Status Info: Full code. Allergies Info: Dexedrine Dextroamphetamine Sulfate Er, Oxycodone           Current Medications (12/12/2014): Current Facility-Administered Medications  Medication Dose Route Frequency Provider Last Rate Last Dose  . acetaminophen (TYLENOL) tablet 325-650 mg  325-650 mg Oral Q4H PRN [name], MD      . aspirin chewable tablet 81 mg  81 mg Oral QHS [name], MD      .  ceFAZolin (ANCEF) IVPB 1 g/50 mL premix  1 g Intravenous Q6H [name], MD      . clonazePAM [facility] [place]) tablet 0.5 mg  0.5 mg Oral QHS [name], MD      . clonazePAM [facility] [place]) tablet 0.5 mg  0.5 mg Oral TID PRN [name], MD      . doxazosin (CARDURA) tablet 1 mg  1 mg Oral QHS [name], MD      . gabapentin (NEURONTIN) capsule 100 mg  100 mg  Oral QHS [name], MD      . insulin glargine (LANTUS) injection 35 Units  35 Units Subcutaneous BID [name], MD      . lamoTRIgine (LAMICTAL) tablet 50 mg  50 mg Oral Daily [name], MD   50 mg at 12/12/14 1508  . nitroGLYCERIN (NITROSTAT) SL tablet 0.4 mg  0.4 mg Sublingual Q5 min PRN [name], MD      . ondansetron [facility]) injection 4 mg  4 mg Intravenous Q6H PRN [name], MD      . traZODone (DESYREL) tablet 50 mg  50 mg Oral QHS PRN [name], MD       Do not use this list as official medication orders. Please verify with discharge summary.  Discharge Medications:   Medication List    ASK your doctor about these medications        acetaminophen 325 MG tablet  Commonly known as:  TYLENOL  Take 650 mg by mouth every 4 (four) hours as needed (pain). Do not exceed 4 gm of acetaminophen in 24 hours     aspirin 81 MG chewable tablet  Chew 81 mg by mouth at bedtime.     clonazePAM 0.5 MG tablet  Commonly known as:  KLONOPIN  Take 0.5 mg by mouth See admin instructions. Take 1 tablet (0.5 mg) by mouth daily at bedtime, may also take 1 tablet (0.5 mg) 3 times daily as needed for anxiety     doxazosin 1 MG tablet  Commonly known as:  CARDURA  Take 1 mg by mouth at bedtime.     gabapentin 100 MG capsule  Commonly known as:  NEURONTIN  Take 100 mg by mouth at bedtime.     guaifenesin 100 MG/5ML syrup  Commonly known as:  ROBITUSSIN  Take 100 mg by mouth every 6 (six) hours as needed for cough.     HUMALOG KWIKPEN 100 UNIT/ML KiwkPen  Generic drug:  insulin lispro  Inject 4-10 Units into the skin 4 (four) times daily. Per sliding scale: CBG 201-25 4 units, 251-300 6 units, 301-350 8 units, 351-400 10 units, >400 12 units and call MD (at 6:30am, 11:30am, 4:30 pm, 9pm)     insulin glargine 100 UNIT/ML injection  Commonly known as:  LANTUS  Inject 35 Units into the skin 2 (two) times daily. 10am and 5pm     lamoTRIgine 100 MG tablet  Commonly  known as:  LAMICTAL  Take 50 mg by mouth daily. 10am     loperamide 2 MG capsule  Commonly known as:  IMODIUM  Take 4 mg by mouth as needed (after each loose stool).     nitroGLYCERIN 0.4 MG SL tablet  Commonly known as:  NITROSTAT  Place 0.4 mg under the tongue every 5 (five) minutes as needed for chest pain.     oxyCODONE 5 MG immediate release tablet  Commonly known as:  Oxy IR/ROXICODONE  Take 5 mg by mouth every 6 (six) hours. scheduled     traMADol 50 MG tablet  Commonly known as:  ULTRAM  Take 50 mg by mouth every 12 (twelve) hours as needed (pain).     traZODone 50 MG tablet  Commonly known as:  DESYREL  Take 50 mg by mouth at bedtime.        Relevant Imaging Results:  Relevant Lab Results:  Recent Labs    Additional Information    Sable Feil, LCSW

## 2014-12-12 NOTE — Clinical Social Work Note (Signed)
Clinical Social Work Assessment  Patient Details  Name: [name] MRN: [id number] Date of Birth: [birth year]  Date of referral:  12/12/14               Reason for consult:  Facility Placement                Permission sought to share information with:  Family Supports Permission granted to share information::  Yes, Verbal Permission Granted (Wife was in the room with patient.)  Name::     [place]::     Relationship::  Wife  Contact Information:  [phone]  Housing/Transportation Living arrangements for the past 2 months:  Single Family Home Source of Information:  Patient, Spouse Patient Interpreter Needed:  None Criminal Activity/Legal Involvement Pertinent to Current Situation/Hospitalization:  No - Comment as needed Significant Relationships:    Lives with:  Spouse Do you feel safe going back to the place where you live?  Yes (Patient feels safe at home, but is returning to [facility] to continue his rehab) Need for family participation in patient care:  Yes (Comment)  Care giving concerns:  None expressed by patient or his wife.   Social Worker assessment / plan:  CSW talked with patient and wife at the bedside regarding discharge plans. Spouse did most of the talking as Mr. Thien was eating.  Per Mrs. [name], patient will return to [facility] at discharge.   Call made to [facility], admissions director at [facility] regarding patient and informed her that patient will be ready for d/c over the weekend.   Employment status:  Retired Forensic scientist:  Medicaid In [place], [employer] PT Recommendations:  [place] / Referral to community resources:  [place] (None needed or requested at this time)  Patient/Family's Response to care:  No concerns expressed.  Patient/Family's Understanding of and Emotional Response to Diagnosis, Current Treatment, and Prognosis:  Not discussed.  Emotional  Assessment Appearance:  Appears stated age Attitude/Demeanor/Rapport:  Other (Appropriate) Affect (typically observed):  Appropriate Orientation:  Oriented to Self, Oriented to Place, Oriented to  Time, Oriented to Situation Alcohol / Substance use:  Tobacco Use (Patient quit smoking in 2010. Patient reports no alcohol or illicit drug use.) Psych involvement (Current and /or in the community):  No (Comment)  Discharge Needs  Concerns to be addressed:  Discharge Planning Concerns Readmission within the last 30 days:  No Current discharge risk:  None Barriers to Discharge:  No Barriers Identified   Sable Feil, LCSW 12/12/2014, 3:05 PM

## 2014-12-13 ENCOUNTER — Encounter (HOSPITAL_COMMUNITY): Payer: Self-pay | Admitting: Cardiology

## 2014-12-13 ENCOUNTER — Other Ambulatory Visit: Payer: Self-pay

## 2014-12-13 ENCOUNTER — Ambulatory Visit (HOSPITAL_COMMUNITY): Payer: Medicare Other

## 2014-12-13 DIAGNOSIS — I441 Atrioventricular block, second degree: Secondary | ICD-10-CM | POA: Diagnosis not present

## 2014-12-13 DIAGNOSIS — I255 Ischemic cardiomyopathy: Secondary | ICD-10-CM | POA: Diagnosis not present

## 2014-12-13 DIAGNOSIS — R001 Bradycardia, unspecified: Secondary | ICD-10-CM | POA: Diagnosis not present

## 2014-12-13 DIAGNOSIS — I110 Hypertensive heart disease with heart failure: Secondary | ICD-10-CM | POA: Diagnosis not present

## 2014-12-13 DIAGNOSIS — Z950 Presence of cardiac pacemaker: Secondary | ICD-10-CM | POA: Diagnosis not present

## 2014-12-13 LAB — GLUCOSE, CAPILLARY: Glucose-Capillary: 253 mg/dL — ABNORMAL HIGH (ref 65–99)

## 2014-12-13 MED ORDER — OXYMETAZOLINE HCL 0.05 % NA SOLN
1.0000 | Freq: Two times a day (BID) | NASAL | Status: DC
  Administered 2014-12-13: 1 via NASAL
  Filled 2014-12-13: qty 15

## 2014-12-13 MED ORDER — TRAMADOL HCL 50 MG PO TABS
50.0000 mg | ORAL_TABLET | Freq: Once | ORAL | Status: AC
  Administered 2014-12-13: 01:00:00 50 mg via ORAL
  Filled 2014-12-13: qty 1

## 2014-12-13 NOTE — Progress Notes (Signed)
Patient ID: [name], male   DOB: January 13, 1941, 74 y.o.   MRN: [id number]    SUBJECTIVE: The patient is doing well today.  At this time, he denies chest pain, shortness of breath, or any new concerns. Minimal incisional pain.   [name] Kitchen aspirin  81 mg Oral QHS  . clonazePAM  0.5 mg Oral QHS  . doxazosin  1 mg Oral QHS  . gabapentin  100 mg Oral QHS  . insulin glargine  35 Units Subcutaneous BID  . lamoTRIgine  50 mg Oral Daily  . oxymetazoline  1 spray Each Nare BID      OBJECTIVE: Physical Exam: Filed Vitals:   12/12/14 2000 12/12/14 2100 12/13/14 0237 12/13/14 0349  BP: 178/81 183/75 189/92 168/102  Pulse: 66 70 78   Temp:   98 F (36.7 C)   TempSrc:   Oral   Resp: 13 18 20 20   Height:      Weight:   235 lb 7.2 oz (106.8 kg)   SpO2: 95% 91% 95% 95%    Intake/Output Summary (Last 24 hours) at 12/13/14 [id number] Last data filed at 12/12/14 1758  Gross per 24 hour  Intake    320 ml  Output      0 ml  Net    320 ml    Telemetry reveals sinus rhythm  GEN- The patient is well appearing and alert   Head- normocephalic, atraumatic Eyes-  Sclera clear, conjunctiva pink Ears- hearing intact Oropharynx- clear Neck- supple, no JVP Lymph- no cervical lymphadenopathy Lungs- Clear to ausculation bilaterally, normal work of breathing, incision clean and dry. Heart- Regular rate and rhythm, no murmurs, rubs or gallops, PMI not laterally displaced GI- soft, NT, ND, + BS Extremities- no clubbing, cyanosis, or edema Skin- no rash or lesion Psych- euthymic mood, full affect Neuro- strength and sensation are intact  LABS: Basic Metabolic Panel: No results for input(s): NA, K, CL, CO2, GLUCOSE, BUN, CREATININE, CALCIUM, MG, PHOS in the last 72 hours. Liver Function Tests: No results for input(s): AST, ALT, ALKPHOS, BILITOT, PROT, ALBUMIN in the last 72 hours. No results for input(s): LIPASE, AMYLASE in the last 72 hours. CBC: No results for input(s): WBC, NEUTROABS, HGB, HCT, MCV,  PLT in the last 72 hours. Cardiac Enzymes: No results for input(s): CKTOTAL, CKMB, CKMBINDEX, TROPONINI in the last 72 hours. BNP: Invalid input(s): POCBNP D-Dimer: No results for input(s): DDIMER in the last 72 hours. Hemoglobin A1C: No results for input(s): HGBA1C in the last 72 hours. Fasting Lipid Panel: No results for input(s): CHOL, HDL, LDLCALC, TRIG, CHOLHDL, LDLDIRECT in the last 72 hours. Thyroid Function Tests: No results for input(s): TSH, T4TOTAL, T3FREE, THYROIDAB in the last 72 hours.  Invalid input(s): FREET3 Anemia Panel: No results for input(s): VITAMINB12, FOLATE, FERRITIN, TIBC, IRON, RETICCTPCT in the last 72 hours.  RADIOLOGY: No results found.  Tele - NSR with ventricular pacing  ASSESSMENT AND PLAN:  Active Problems:   Mobitz type 2 second degree atrioventricular block   Pacemaker He is doing well after PPM insertion. His PPM has been interrogated and is working normally. He is stable for discharge with usual follow-up.   [name], MD 12/13/2014 7:33 AM

## 2014-12-13 NOTE — Discharge Summary (Signed)
Discharge Summary   Patient ID: [name],  MRN: [id number], DOB/AGE: 76-Jul-1942 76 y.o.  Admit date: 12/12/2014 Discharge date: 12/13/2014  Primary Care Provider: Cathlean Cower Primary Cardiologist: Dr. Angelena Form Primary Electrophysiologist: Dr. Lovena Le  Discharge Diagnoses Principal Problem:   Mobitz type 2 second degree atrioventricular block Active Problems:   OBESITY   Obstructive sleep apnea   Hypertensive heart disease with heart failure (HCC)   CAD (coronary artery disease)   Ischemic cardiomyopathy   Pacemaker   Allergies Allergies  Allergen Reactions  . Dexedrine [Dextroamphetamine Sulfate Er] Other (See Comments)    agitation  . Oxycodone Nausea And Vomiting    Procedures  St Jude Dual Chamber PPM placement 12/12/2014    PREPROCEDURE DIAGNOSIS: Symptomatic Bradycardia due to 2:1 AV block   POSTPROCEDURE DIAGNOSIS: Same as preprocedure diagnosis   PROCEDURES:  1. Pacemaker implantation.   St. Jude (serial number [id number]) pacemaker St. Jude (serial number [id number]) right atrial lead  St. Jude (serial number [id number]) right ventricular lead   CONCLUSIONS:  1. Successful implantation of a St. Jude dual-chamber pacemaker for symptomatic bradycardia due to symptomatic 2:1 AV block (Mobitz 2 second degree AV block). 2. No early apparent complications.    CXR 12/13/2014 IMPRESSION: 1. Well-positioned two lead left subclavian pacemaker. No pneumothorax. 2. Stable mild cardiomegaly without pulmonary edema. No active pulmonary disease.    Hospital Course  The patient is a 74 year old male with past medical history of CAD s/p 4V CABG 10/15/08, ischemic cardiomyopathy, HTN, DM, BPH, Parkinsons disease, and dementia. His last 2D echo in Dec 0000000 showed LV systolic dysfunction with an EF of 40%. He was last seen by Dr. Angelena Form 05/2013 and was noted to be stable from a cardiac standpoint. He is a resident at [facility]. He has been having  second degree heart block and bradycardia with reported rate in the 30s noted by medical staff at [facility]. He was seen in the clinic, EKG showed 2:1 second-degree heart block. He is not on any AV nodal blocking agent. The EKG was reviewed by Dr. Lovena Le in the office, he will require a pacemaker for symptomatic 2:1 AVB.   Patient presented on 12/12/2014 for the scheduled procedure. He underwent a successful implantation of St. Jude dual-chamber pacemaker for symptomatic bradycardia. There was no early apparent competition. He was seen on the following day on 12/13/2014, at which time patient denies any significant discomfort. Chest x-ray showed well-positioned to lead left subclavian pacemaker, no pneumothorax. No active pulmonary disease and no pulmonary edema either. Patient is deemed stable for discharge from the electrophysiology perspective. Post PPM instruction has been given.    Discharge Vitals Blood pressure 166/89, pulse 76, temperature 98.1 F (36.7 C), temperature source Oral, resp. rate 20, height 5\' 8"  (1.727 m), weight 235 lb 7.2 oz (106.8 kg), SpO2 95 %.  Filed Weights   12/12/14 0932 12/13/14 0237  Weight: 235 lb (106.595 kg) 235 lb 7.2 oz (106.8 kg)    Labs   Disposition  Pt is being discharged home today in good condition.  Follow-up Plans & Appointments      Follow-up Information    Follow up with [facility] On 01/01/2015.   Specialty:  Cardiology   Why:  3:00pm. Wound check.   Contact information:   [address] [place] [phone]      Follow up with [name], MD On 03/16/2015.   Specialty:  Cardiology   Why:  3:00pm.  3 Month followup   Contact information:   [id number] [address] [place] [address] [phone]       Discharge Medications    Medication List    TAKE these medications        acetaminophen 325 MG tablet  Commonly known as:  TYLENOL  Take 650 mg by  mouth every 4 (four) hours as needed (pain). Do not exceed 4 gm of acetaminophen in 24 hours     aspirin 81 MG chewable tablet  Chew 81 mg by mouth at bedtime.     clonazePAM 0.5 MG tablet  Commonly known as:  KLONOPIN  Take 0.5 mg by mouth See admin instructions. Take 1 tablet (0.5 mg) by mouth daily at bedtime, may also take 1 tablet (0.5 mg) 3 times daily as needed for anxiety     doxazosin 1 MG tablet  Commonly known as:  CARDURA  Take 1 mg by mouth at bedtime.     gabapentin 100 MG capsule  Commonly known as:  NEURONTIN  Take 100 mg by mouth at bedtime.     guaifenesin 100 MG/5ML syrup  Commonly known as:  ROBITUSSIN  Take 100 mg by mouth every 6 (six) hours as needed for cough.     HUMALOG KWIKPEN 100 UNIT/ML KiwkPen  Generic drug:  insulin lispro  Inject 4-10 Units into the skin 4 (four) times daily. Per sliding scale: CBG 201-25 4 units, 251-300 6 units, 301-350 8 units, 351-400 10 units, >400 12 units and call MD (at 6:30am, 11:30am, 4:30 pm, 9pm)     insulin glargine 100 UNIT/ML injection  Commonly known as:  LANTUS  Inject 35 Units into the skin 2 (two) times daily. 10am and 5pm     lamoTRIgine 100 MG tablet  Commonly known as:  LAMICTAL  Take 50 mg by mouth daily. 10am     loperamide 2 MG capsule  Commonly known as:  IMODIUM  Take 4 mg by mouth as needed (after each loose stool).     nitroGLYCERIN 0.4 MG SL tablet  Commonly known as:  NITROSTAT  Place 0.4 mg under the tongue every 5 (five) minutes as needed for chest pain.     oxyCODONE 5 MG immediate release tablet  Commonly known as:  Oxy IR/ROXICODONE  Take 5 mg by mouth every 6 (six) hours. scheduled     traMADol 50 MG tablet  Commonly known as:  ULTRAM  Take 50 mg by mouth every 12 (twelve) hours as needed (pain).     traZODone 50 MG tablet  Commonly known as:  DESYREL  Take 50 mg by mouth at bedtime.        Duration of Discharge Encounter   Greater than 30 minutes including physician  time.  Hilbert Corrigan PA-C Pager: [phone] 12/13/2014, 9:19 AM   EP Attending  Patient seen and examined. Agree with above.  Mikle Bosworth.D.

## 2014-12-13 NOTE — Discharge Instructions (Signed)
° ° °  Supplemental Discharge Instructions for  Pacemaker/Defibrillator Patients  Activity No heavy lifting or vigorous activity with your left/right arm for 6 to 8 weeks.  Do not raise your left/right arm above your head for one week.  Gradually raise your affected arm as drawn below.           __11/29                      11/30                      12/1                       12/2  NO DRIVING for  48 hours   ; you may begin driving on    [id number] .  WOUND CARE - Keep the wound area clean and dry.  Do not get this area wet for one week. No showers for one week; you may shower on   12/20/2014  . - The tape/steri-strips on your wound will fall off; do not pull them off.  No bandage is needed on the site.  DO  NOT apply any creams, oils, or ointments to the wound area. - If you notice any drainage or discharge from the wound, any swelling or bruising at the site, or you develop a fever > 101? F after you are discharged home, call the office at once.  Special Instructions - You are still able to use cellular telephones; use the ear opposite the side where you have your pacemaker/defibrillator.  Avoid carrying your cellular phone near your device. - When traveling through airports, show security personnel your identification card to avoid being screened in the metal detectors.  Ask the security personnel to use the hand wand. - Avoid arc welding equipment, MRI testing (magnetic resonance imaging), TENS units (transcutaneous nerve stimulators).  Call the office for questions about other devices. - Avoid electrical appliances that are in poor condition or are not properly grounded. - Microwave ovens are safe to be near or to operate.  Additional information for defibrillator patients should your device go off: - If your device goes off ONCE and you feel fine afterward, notify the device clinic nurses. - If your device goes off ONCE and you do not feel well afterward, call 911. - If your device  goes off TWICE, call 911. - If your device goes off THREE times in one day, call 911.  DO NOT DRIVE YOURSELF OR A FAMILY MEMBER WITH A DEFIBRILLATOR TO THE HOSPITAL--CALL 911.

## 2014-12-15 ENCOUNTER — Encounter: Payer: Self-pay | Admitting: Internal Medicine

## 2014-12-15 ENCOUNTER — Encounter (HOSPITAL_COMMUNITY): Payer: Self-pay | Admitting: Internal Medicine

## 2014-12-15 MED FILL — Sodium Chloride Irrigation Soln 0.9%: Qty: 500 | Status: AC

## 2014-12-15 MED FILL — Gentamicin Sulfate Inj 40 MG/ML: INTRAMUSCULAR | Qty: 2 | Status: AC

## 2014-12-15 MED FILL — Heparin Sodium (Porcine) 2 Unit/ML in Sodium Chloride 0.9%: INTRAMUSCULAR | Qty: 500 | Status: AC

## 2014-12-16 ENCOUNTER — Telehealth: Payer: Self-pay | Admitting: *Deleted

## 2014-12-16 NOTE — Telephone Encounter (Signed)
Pt was on TCM list admitted for Mobitz type 2 second degree Atrioventricular block. Pt will be f/u ith cardiology 01/01/15. Pt hasn't seen Dr. Jenny Reichmann since 2014 No longer pcp.[name] KitchenJohny [name]

## 2015-01-01 ENCOUNTER — Ambulatory Visit (INDEPENDENT_AMBULATORY_CARE_PROVIDER_SITE_OTHER): Payer: Medicare Other | Admitting: *Deleted

## 2015-01-01 ENCOUNTER — Encounter: Payer: Self-pay | Admitting: Internal Medicine

## 2015-01-01 DIAGNOSIS — I441 Atrioventricular block, second degree: Secondary | ICD-10-CM

## 2015-01-01 DIAGNOSIS — Z950 Presence of cardiac pacemaker: Secondary | ICD-10-CM | POA: Diagnosis not present

## 2015-01-01 LAB — CUP PACEART INCLINIC DEVICE CHECK
Brady Statistic RA Percent Paced: 1.7 %
Date Time Interrogation Session: 20161215170803
Implantable Lead Location: 753860
Lead Channel Impedance Value: 437.5 Ohm
Lead Channel Pacing Threshold Amplitude: 0.5 V
Lead Channel Pacing Threshold Amplitude: 0.5 V
Lead Channel Pacing Threshold Pulse Width: 0.5 ms
Lead Channel Pacing Threshold Pulse Width: 0.5 ms
Lead Channel Setting Pacing Amplitude: 3.5 V
Lead Channel Setting Pacing Amplitude: 3.5 V
Lead Channel Setting Pacing Pulse Width: 0.5 ms
MDC IDC LEAD IMPLANT DT: 20161125
MDC IDC LEAD IMPLANT DT: 20161125
MDC IDC LEAD LOCATION: 753859
MDC IDC MSMT BATTERY REMAINING LONGEVITY: 69.6
MDC IDC MSMT BATTERY VOLTAGE: 3.02 V
MDC IDC MSMT LEADCHNL RA PACING THRESHOLD AMPLITUDE: 0.5 V
MDC IDC MSMT LEADCHNL RA PACING THRESHOLD PULSEWIDTH: 0.5 ms
MDC IDC MSMT LEADCHNL RA PACING THRESHOLD PULSEWIDTH: 0.5 ms
MDC IDC MSMT LEADCHNL RA SENSING INTR AMPL: 2.3 mV
MDC IDC MSMT LEADCHNL RV IMPEDANCE VALUE: 575 Ohm
MDC IDC MSMT LEADCHNL RV PACING THRESHOLD AMPLITUDE: 0.5 V
MDC IDC SET LEADCHNL RV SENSING SENSITIVITY: 4 mV
MDC IDC STAT BRADY RV PERCENT PACED: 99.97 %
Pulse Gen Serial Number: 7825467

## 2015-01-01 NOTE — Progress Notes (Signed)
Wound check appointment. Steri-strips removed prior to appt at [facility] facility. Wound without redness or edema. Incision edges approximated, wound well healed. Normal device function. Thresholds, sensing, and impedances consistent with implant measurements. Device programmed at 3.5V/auto capture programmed on for extra safety margin until 3 month visit. Histogram distribution appropriate for patient and level of activity. 9 mode switches- longest 1:14, pk A 155bpm. No high ventricular rates noted. 611 PMT episodes- no VA conduction noted, EGMs appear to be AT. Pt maintained a sinus rate of 90-120bpm throughout appt (wheelchair bound). Patient educated about wound care, arm mobility, lifting restrictions. Written instructions provided with patient for facility including patient's resting heart rate. ROV with GT 03/16/15.

## 2015-01-07 ENCOUNTER — Encounter: Payer: Self-pay | Admitting: Internal Medicine

## 2015-01-07 ENCOUNTER — Non-Acute Institutional Stay (SKILLED_NURSING_FACILITY): Payer: Medicare Other | Admitting: Internal Medicine

## 2015-01-07 DIAGNOSIS — E559 Vitamin D deficiency, unspecified: Secondary | ICD-10-CM

## 2015-01-07 DIAGNOSIS — I441 Atrioventricular block, second degree: Secondary | ICD-10-CM | POA: Diagnosis not present

## 2015-01-07 DIAGNOSIS — F3175 Bipolar disorder, in partial remission, most recent episode depressed: Secondary | ICD-10-CM | POA: Diagnosis not present

## 2015-01-07 HISTORY — DX: Vitamin D deficiency, unspecified: E55.9

## 2015-01-07 NOTE — Progress Notes (Signed)
MRN: [id number] Name: [name]  Sex: male Age: 74 y.o. DOB: 24-Sep-1940  [place] #: [facility] Facility/Room: Level Of Care: SNF Provider: Inocencio Homes D Emergency Contacts: Extended Emergency Contact Information Primary Emergency Contact: Rossi,[name] Address: [address]          [place], [place] [address] [name] of [place] Phone: [phone] Mobile Phone: [phone] Relation: Spouse Secondary Emergency Contact: [name], [name] of [place] Phone: [phone] Mobile Phone: [phone] Relation: Daughter  Code Status:   Allergies: Dexedrine and Oxycodone  Chief Complaint  Patient presents with  . Medical Management of Chronic Issues    HPI: Patient is 74 y.o. male with CAD, s/p CABG 2010. CHF , EF 53%, DM2, HLD who I am seeing today for routine issues of second degree AV block, Vit D deficiency and bipolar dx.   Past Medical History  Diagnosis Date  . CAD (coronary artery disease)     s/p 4V CABG 10/15/08; EF is 50%  . Obesity   . BPH (benign prostatic hypertrophy)   . Edema   . Left ventricular systolic dysfunction     EF is 40% per echo December 2012  . AMI, INFERIOR WALL 09/30/2008    Qualifier: Diagnosis of  By: Unk Lightning, RN, BSN, Melanie    . CAD, ARTERY BYPASS GRAFT 11/18/2008    Qualifier: Diagnosis of  By: Angelena Form, MD, Harrell Gave    . Other specified forms of chronic ischemic heart disease 05/19/2009    Centricity Description: CARDIOMYOPATHY, ISCHEMIC Qualifier: Diagnosis of  By: Angelena Form, MD, Christopher   Centricity Description: OTHER SPEC FORMS CHRONIC ISCHEMIC HEART DISEASE Qualifier: Diagnosis of  By: Shelda Pal    . UNIVERSAL ULCERATIVE COLITIS 07/31/2007    Qualifier: Diagnosis of  By: [name] MD, [name]   . Ischemic cardiomyopathy 05/05/2011  . DM 09/01/2008    Qualifier: Diagnosis of  By: Burnett Kanaris    . OBSTRUCTIVE SLEEP APNEA 09/16/2008    Qualifier: Diagnosis of  By: [name] MD,  [name]   . HYPERTENSION 09/01/2008    Qualifier: Diagnosis of  By: Burnett Kanaris    . Hyperlipidemia 05/18/2010  . Gout 09/23/2011  . Bipolar affective disorder ([place]) 09/23/2011  . TIA (transient ischemic attack) 09/23/2011  . Chronic pain 09/23/2011  . DNR (do not resuscitate) 09/23/2011    Per July 2011 [place] admission  . CVA (cerebral infarction) 09/23/2011    Small left occipital  . 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 History of alcohol abuse 09/23/2011  . Blood in stool   . Depression   . Urine incontinence   . Stroke [facility])     "mini strokes"  . Incontinence of urine   . Neuromuscular disorder ([place])     Diabetic neuropathy  . [facility] spotted fever   . Benign neoplasm of colon 03/2011    Adenomatous polyps  . 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 Lovena Le 12/12/2014    Past Surgical History  Procedure Laterality Date  . Coronary artery bypass graft      Median sternotomy, extracorporeal circulation,  coronary artery bypass graft surgery x4 using a sequential left internal   mammary artery graft to the mid and distal left anterior descending, a   saphenous vein graft to diagonal branch of the left anterior descending,   and a saphenous vein graft to  the obtuse marginal branch of left   circumflex coronary artery.    . Cardiac catheterization      Triple-vessel coronary artery disease. Low-normal left ventricular systolic function with mild   anteroapical wall motion abnormality.   . Tonsilectomy, adenoidectomy, bilateral myringotomy and tubes  1946  . Multiple extractions with alveoloplasty  12/01/2011    Procedure: MULTIPLE EXTRACION WITH ALVEOLOPLASTY;  Surgeon: Lenn Cal, DDS;  Location: [place];  Service: Oral Surgery;  Laterality: N/A;  Extraction of tooth #'s 3,4,5,6,7,8,9,10,11,12,13,14,15,16,17,20,21,22,23,24,25,26,27,28,29,30,31,32 with alveoloplasty and bilateral mandibular lingual tori  . Flexible  sigmoidoscopy  01/17/2012    Procedure: FLEXIBLE SIGMOIDOSCOPY;  Surgeon: Lafayette Dragon, MD;  [facility];  Service: Endoscopy;  Laterality: N/A;  . Ep implantable device N/A 12/12/2014    Procedure: Pacemaker Implant;  Surgeon: [name], MD;  Location: [place] CV LAB;  Service: Cardiovascular;  Laterality: N/A;      Medication List       This list is accurate as of: 01/07/15 11:59 PM.  Always use your most recent med list.               acetaminophen 325 MG tablet  Commonly known as:  TYLENOL  Take 650 mg by mouth every 4 (four) hours as needed (pain). Do not exceed 4 gm of acetaminophen in 24 hours     aspirin 81 MG chewable tablet  Chew 81 mg by mouth at bedtime.     clonazePAM 0.5 MG tablet  Commonly known as:  KLONOPIN  Take 0.5 mg by mouth See admin instructions. Take 1 tablet (0.5 mg) by mouth daily at bedtime, may also take 1 tablet (0.5 mg) 3 times daily as needed for anxiety     doxazosin 1 MG tablet  Commonly known as:  CARDURA  Take 1 mg by mouth at bedtime.     gabapentin 100 MG capsule  Commonly known as:  NEURONTIN  Take 100 mg by mouth at bedtime.     guaifenesin 100 MG/5ML syrup  Commonly known as:  ROBITUSSIN  Take 100 mg by mouth every 6 (six) hours as needed for cough.     HUMALOG KWIKPEN 100 UNIT/ML KiwkPen  Generic drug:  insulin lispro  Inject 4-10 Units into the skin 4 (four) times daily. Per sliding scale: CBG 201-25 4 units, 251-300 6 units, 301-350 8 units, 351-400 10 units, >400 12 units and call MD (at 6:30am, 11:30am, 4:30 pm, 9pm)     insulin glargine 100 UNIT/ML injection  Commonly known as:  LANTUS  Inject 35 Units into the skin 2 (two) times daily. 10am and 5pm     lamoTRIgine 100 MG tablet  Commonly known as:  LAMICTAL  Take 100 mg by mouth daily. 10am     loperamide 2 MG capsule  Commonly known as:  IMODIUM  Take 4 mg by mouth as needed (after each loose stool).     nitroGLYCERIN 0.4 MG SL tablet   Commonly known as:  NITROSTAT  Place 0.4 mg under the tongue every 5 (five) minutes as needed for chest pain.     oxyCODONE 5 MG immediate release tablet  Commonly known as:  Oxy IR/ROXICODONE  Take 5 mg by mouth every 6 (six) hours. scheduled     traMADol 50 MG tablet  Commonly known as:  ULTRAM  Take 50 mg by mouth every 12 (twelve) hours as needed (pain).     traZODone 50 MG tablet  Commonly known as:  DESYREL  Take 50 mg by mouth at bedtime.        No orders of the defined types were placed in this encounter.     There is no immunization history on file for this patient.  Social History  Substance Use Topics  . Smoking status: Former Smoker    Quit date: 01/18/2008  . Smokeless tobacco: Never Used  . Alcohol Use: No    Review of Systems  DATA OBTAINED: from patient, nurse GENERAL:  no fevers, fatigue, appetite changes SKIN: No itching, rash HEENT: No complaint RESPIRATORY: No cough, wheezing, SOB CARDIAC: No chest pain, palpitations, lower extremity edema  GI: No abdominal pain, No N/V/D or constipation, No heartburn or reflux  GU: No dysuria, frequency or urgency, or incontinence  MUSCULOSKELETAL: No unrelieved bone/joint pain NEUROLOGIC: No headache, dizziness  PSYCHIATRIC: No overt anxiety or sadness  Filed Vitals:   01/07/15 1533  BP: 147/89  Pulse: 76  Temp: 98.1 F (36.7 C)  Resp: 18    Physical Exam  GENERAL APPEARANCE: Alert, conversant, No acute distress  SKIN: No diaphoresis rash HEENT: Unremarkable RESPIRATORY: Breathing is even, unlabored. Lung sounds are clear   CARDIOVASCULAR: Heart RRR no murmurs, rubs or gallops. No peripheral edema  GASTROINTESTINAL: Abdomen is soft, non-tender, not distended w/ normal bowel sounds.  GENITOURINARY: Bladder non tender, not distended  MUSCULOSKELETAL: No abnormal joints or musculature NEUROLOGIC: Cranial nerves 2-12 grossly intact. Moves all extremities PSYCHIATRIC: Mood and affect appropriate to  situation, no behavioral issues  Patient Active Problem List   Diagnosis Date Noted  . Vitamin D deficiency 01/07/2015  . Mobitz type 2 second degree atrioventricular block 12/12/2014  . Pacemaker 12/12/2014  . Symptomatic bradycardia 12/03/2014  . Lower back pain 06/12/2012  . Right hip pain 06/12/2012  . Intertrigo 05/09/2012  . Insomnia 04/19/2012  . General weakness 03/07/2012  . 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 Diarrhea 01/17/2012  . Pyelonephritis 01/15/2012  . Peripheral neuropathy ([place]) 09/28/2011  . Urinary incontinence 09/28/2011  . Preventative health care 09/23/2011  . Gout 09/23/2011  . Bipolar affective disorder ([place]) 09/23/2011  . TIA (transient ischemic attack) 09/23/2011  . Chronic pain 09/23/2011  . DNR (do not resuscitate) 09/23/2011  . CVA (cerebral infarction) 09/23/2011  . 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 History of alcohol abuse 09/23/2011  . BPH (benign prostatic hypertrophy)   . Left ventricular systolic dysfunction   . Ischemic cardiomyopathy 05/05/2011  . Edema 01/03/2011  . CAD (coronary artery disease) 05/18/2010  . Hyperlipidemia 05/18/2010  . Other abnormality of urination(788.69) 11/25/2009  . Other specified forms of chronic ischemic heart disease 05/19/2009  . CAD, ARTERY BYPASS GRAFT 11/18/2008  . AMI, INFERIOR WALL 09/30/2008  . Obstructive sleep apnea 09/16/2008  . SINUS TACHYCARDIA 09/02/2008  . DM (diabetes mellitus), type 2 with peripheral vascular complications ([place]) XX123456  . OBESITY 09/01/2008  . Hypertensive heart disease with heart failure ([place]) 09/01/2008  . TOBACCO ABUSE 07/31/2007  . 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    CBC    Component Value Date/Time   WBC 6.8 12/05/2014 1536   RBC 4.37 12/05/2014 1536   HGB 13.5 12/05/2014 1536   HCT 40.7 12/05/2014 1536   PLT 136* 12/05/2014 1536   MCV 93.1 12/05/2014 1536   LYMPHSABS 1.8 12/05/2014 1536   MONOABS 0.5 12/05/2014 1536   EOSABS  0.3 12/05/2014 1536   BASOSABS 0.1 12/05/2014 1536    CMP     Component Value Date/Time   NA 136 12/05/2014  1536   K 4.1 12/05/2014 1536   CL 101 12/05/2014 1536   CO2 24 12/05/2014 1536   GLUCOSE 242* 12/05/2014 1536   BUN 27* 12/05/2014 1536   CREATININE 1.06 12/05/2014 1536   CREATININE 0.85 07/22/2012 0440   CALCIUM 9.1 12/05/2014 1536   PROT 6.3 07/22/2012 0440   ALBUMIN 2.9* 07/22/2012 0440   AST 17 07/22/2012 0440   ALT 26 07/22/2012 0440   ALKPHOS 45 07/22/2012 0440   BILITOT 0.3 07/22/2012 0440   GFRNONAA 84* 07/22/2012 0440   GFRAA >90 07/22/2012 0440    Assessment and Plan  Mobitz type 2 second degree atrioventricular block St Jude Dual Chamber PPM placement 12/12/2014 ; pt hashad no more episodes  Vitamin D deficiency Vitamin D 50,000 u weekly for 12 weeks then q month; recheck in 6 months  Bipolar affective disorder Pt no longer in risperdal, now lamixctal, which was just increased from 50 mg to 100 mg for better contril;will monitor     [name], MD

## 2015-01-07 NOTE — Assessment & Plan Note (Signed)
Vitamin D 50,000 u weekly for 12 weeks then q month; recheck in 6 months

## 2015-01-07 NOTE — Assessment & Plan Note (Signed)
Pt no longer in risperdal, now lamixctal, which was just increased from 50 mg to 100 mg for better contril;will monitor

## 2015-01-07 NOTE — Assessment & Plan Note (Addendum)
St Jude Dual Chamber PPM placement 12/12/2014 ; pt has had no more episodes

## 2015-01-20 ENCOUNTER — Non-Acute Institutional Stay (SKILLED_NURSING_FACILITY): Payer: Medicare Other | Admitting: Internal Medicine

## 2015-01-20 ENCOUNTER — Encounter: Payer: Self-pay | Admitting: Internal Medicine

## 2015-01-20 DIAGNOSIS — E1151 Type 2 diabetes mellitus with diabetic peripheral angiopathy without gangrene: Secondary | ICD-10-CM | POA: Diagnosis not present

## 2015-01-20 NOTE — Progress Notes (Signed)
MRN: [id number] Name: [name]  Sex: male Age: 75 y.o. DOB: 12-13-1940  [place] [facility] Facility/Room: Level Of Care: SNF Provider: Inocencio Homes D Emergency Contacts: Extended Emergency Contact Information Primary Emergency Contact: Huante,[name] Address: [address]          [place], [place] [address] [name] of [place] Phone: [phone] Mobile Phone: [phone] Relation: Spouse Secondary Emergency Contact: [name], [name] of [place] Phone: [phone] Mobile Phone: [phone] Relation: Daughter  Code Status:   Allergies: Dexedrine and Oxycodone  Chief Complaint  Patient presents with  . Acute Visit    HPI: Patient is 75 y.o. malewith  CAD, s/p CABG 2010. CHF , EF 53%, DM2, HLD who I am seeing today for elevated BS in the 400's.Per nursing hid BS is high every afternoon as long as nurse can remember.. He does have sliding scale to cover it. Pt denies that he feels bad at all. Pt admits that he eats what he wants to eat. Admits he does drink a lot of water, as well as tomato juice and diet Dr Malachi Bonds. No fever, no freq urination.  Past Medical History  Diagnosis Date  . CAD (coronary artery disease)     s/p 4V CABG 10/15/08; EF is 50%  . Obesity   . BPH (benign prostatic hypertrophy)   . Edema   . Left ventricular systolic dysfunction     EF is 40% per echo December 2012  . AMI, INFERIOR WALL 09/30/2008    Qualifier: Diagnosis of  By: Unk Lightning, RN, BSN, Melanie    . CAD, ARTERY BYPASS GRAFT 11/18/2008    Qualifier: Diagnosis of  By: Angelena Form, MD, Harrell Gave    . Other specified forms of chronic ischemic heart disease 05/19/2009    Centricity Description: CARDIOMYOPATHY, ISCHEMIC Qualifier: Diagnosis of  By: Angelena Form, MD, Christopher   Centricity Description: OTHER SPEC FORMS CHRONIC ISCHEMIC HEART DISEASE Qualifier: Diagnosis of  By: Shelda Pal    . UNIVERSAL ULCERATIVE COLITIS 07/31/2007    Qualifier:  Diagnosis of  By: [name] MD, [name]   . Ischemic cardiomyopathy 05/05/2011  . DM 09/01/2008    Qualifier: Diagnosis of  By: Burnett Kanaris    . OBSTRUCTIVE SLEEP APNEA 09/16/2008    Qualifier: Diagnosis of  By: [name] MD, [name]   . HYPERTENSION 09/01/2008    Qualifier: Diagnosis of  By: Burnett Kanaris    . Hyperlipidemia 05/18/2010  . Gout 09/23/2011  . Bipolar affective disorder ([place]) 09/23/2011  . TIA (transient ischemic attack) 09/23/2011  . Chronic pain 09/23/2011  . DNR (do not resuscitate) 09/23/2011    Per July 2011 [place] admission  . CVA (cerebral infarction) 09/23/2011    Small left occipital  . 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 History of alcohol abuse 09/23/2011  . Blood in stool   . Depression   . Urine incontinence   . Stroke [facility])     "mini strokes"  . Incontinence of urine   . Neuromuscular disorder ([place])     Diabetic neuropathy  . [facility] spotted fever   . Benign neoplasm of colon 03/2011    Adenomatous polyps  . 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 Lovena Le 12/12/2014    Past Surgical History  Procedure Laterality Date  . Coronary artery bypass graft      Median sternotomy,  extracorporeal circulation,  coronary artery bypass graft surgery x4 using a sequential left internal   mammary artery graft to the mid and distal left anterior descending, a   saphenous vein graft to diagonal branch of the left anterior descending,   and a saphenous vein graft to the obtuse marginal branch of left   circumflex coronary artery.    . Cardiac catheterization      Triple-vessel coronary artery disease. Low-normal left ventricular systolic function with mild   anteroapical wall motion abnormality.   . Tonsilectomy, adenoidectomy, bilateral myringotomy and tubes  1946  . Multiple extractions with alveoloplasty  12/01/2011    Procedure: MULTIPLE EXTRACION WITH ALVEOLOPLASTY;  Surgeon: Lenn Cal, DDS;  Location:  [place];  Service: Oral Surgery;  Laterality: N/A;  Extraction of tooth #'s 3,4,5,6,7,8,9,10,11,12,13,14,15,16,17,20,21,22,23,24,25,26,27,28,29,30,31,32 with alveoloplasty and bilateral mandibular lingual tori  . Flexible sigmoidoscopy  01/17/2012    Procedure: FLEXIBLE SIGMOIDOSCOPY;  Surgeon: Lafayette Dragon, MD;  [facility];  Service: Endoscopy;  Laterality: N/A;  . Ep implantable device N/A 12/12/2014    Procedure: Pacemaker Implant;  Surgeon: [name], MD;  Location: [place] CV LAB;  Service: Cardiovascular;  Laterality: N/A;      Medication List       This list is accurate as of: 01/20/15 11:59 PM.  Always use your most recent med list.               acetaminophen 325 MG tablet  Commonly known as:  TYLENOL  Take 650 mg by mouth every 4 (four) hours as needed (pain). Do not exceed 4 gm of acetaminophen in 24 hours     aspirin 81 MG chewable tablet  Chew 81 mg by mouth at bedtime.     clonazePAM 0.5 MG tablet  Commonly known as:  KLONOPIN  Take 0.5 mg by mouth See admin instructions. Take 1 tablet (0.5 mg) by mouth daily at bedtime, may also take 1 tablet (0.5 mg) 3 times daily as needed for anxiety     doxazosin 1 MG tablet  Commonly known as:  CARDURA  Take 1 mg by mouth at bedtime.     gabapentin 100 MG capsule  Commonly known as:  NEURONTIN  Take 100 mg by mouth at bedtime.     guaifenesin 100 MG/5ML syrup  Commonly known as:  ROBITUSSIN  Take 100 mg by mouth every 6 (six) hours as needed for cough.     HUMALOG KWIKPEN 100 UNIT/ML KiwkPen  Generic drug:  insulin lispro  Inject 4-10 Units into the skin 4 (four) times daily. Per sliding scale: CBG 201-25 4 units, 251-300 6 units, 301-350 8 units, 351-400 10 units, >400 12 units and call MD (at 6:30am, 11:30am, 4:30 pm, 9pm)     insulin glargine 100 UNIT/ML injection  Commonly known as:  LANTUS  Inject 35 Units into the skin 2 (two) times daily. 10am and 5pm     lamoTRIgine 100 MG tablet  Commonly  known as:  LAMICTAL  Take 100 mg by mouth daily. 10am     loperamide 2 MG capsule  Commonly known as:  IMODIUM  Take 4 mg by mouth as needed (after each loose stool).     nitroGLYCERIN 0.4 MG SL tablet  Commonly known as:  NITROSTAT  Place 0.4 mg under the tongue every 5 (five) minutes as needed for chest pain.     oxyCODONE 5 MG immediate release tablet  Commonly known as:  Oxy IR/ROXICODONE  Take  5 mg by mouth every 6 (six) hours. scheduled     traMADol 50 MG tablet  Commonly known as:  ULTRAM  Take 50 mg by mouth every 12 (twelve) hours as needed (pain).     traZODone 50 MG tablet  Commonly known as:  DESYREL  Take 50 mg by mouth at bedtime.        No orders of the defined types were placed in this encounter.     There is no immunization history on file for this patient.  Social History  Substance Use Topics  . Smoking status: Former Smoker    Quit date: 01/18/2008  . Smokeless tobacco: Never Used  . Alcohol Use: No    Review of Systems  DATA OBTAINED: from patient, nurse- as per HPI GENERAL:  no fevers, fatigue, appetite changes SKIN: No itching, rash HEENT: No complaint RESPIRATORY: No cough, wheezing, SOB CARDIAC: No chest pain, palpitations, lower extremity edema, no fainting episodes since pacer put in  GI: No abdominal pain, No N/V/D or constipation, No heartburn or reflux  GU: No dysuria, frequency or urgency, or incontinence  MUSCULOSKELETAL: No unrelieved bone/joint pain NEUROLOGIC: No headache, dizziness  PSYCHIATRIC: No overt anxiety or sadness  Filed Vitals:   01/20/15 2136  BP: 147/89  Pulse: 80  Temp: 97.9 F (36.6 C)  Resp: 18    Physical Exam  GENERAL APPEARANCE: Alert, conversant, WM in WC,No acute distress; room is full of crackers, frito chips and other food SKIN: No diaphoresis rash, or wounds HEENT: Unremarkable RESPIRATORY: Breathing is even, unlabored. Lung sounds are clear   CARDIOVASCULAR: Heart RRR no murmurs, rubs or  gallops. No peripheral edema  GASTROINTESTINAL: Abdomen is soft, non-tender, not distended w/ normal bowel sounds.  GENITOURINARY: Bladder non tender, not distended  MUSCULOSKELETAL: No abnormal joints or musculature NEUROLOGIC: Cranial nerves 2-12 grossly intact. Moves all extremities PSYCHIATRIC: Mood and affect appropriate to situation, no behavioral issues  Patient Active Problem List   Diagnosis Date Noted  . Vitamin D deficiency 01/07/2015  . Mobitz type 2 second degree atrioventricular block 12/12/2014  . Pacemaker 12/12/2014  . Symptomatic bradycardia 12/03/2014  . Lower back pain 06/12/2012  . Right hip pain 06/12/2012  . Intertrigo 05/09/2012  . Insomnia 04/19/2012  . General weakness 03/07/2012  . 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 Diarrhea 01/17/2012  . Pyelonephritis 01/15/2012  . Peripheral neuropathy ([place]) 09/28/2011  . Urinary incontinence 09/28/2011  . Preventative health care 09/23/2011  . Gout 09/23/2011  . Bipolar affective disorder ([place]) 09/23/2011  . TIA (transient ischemic attack) 09/23/2011  . Chronic pain 09/23/2011  . DNR (do not resuscitate) 09/23/2011  . CVA (cerebral infarction) 09/23/2011  . 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 History of alcohol abuse 09/23/2011  . BPH (benign prostatic hypertrophy)   . Left ventricular systolic dysfunction   . Ischemic cardiomyopathy 05/05/2011  . Edema 01/03/2011  . CAD (coronary artery disease) 05/18/2010  . Hyperlipidemia 05/18/2010  . Other abnormality of urination(788.69) 11/25/2009  . Other specified forms of chronic ischemic heart disease 05/19/2009  . CAD, ARTERY BYPASS GRAFT 11/18/2008  . AMI, INFERIOR WALL 09/30/2008  . Obstructive sleep apnea 09/16/2008  . SINUS TACHYCARDIA 09/02/2008  . DM (diabetes mellitus), type 2 with peripheral vascular complications ([place]) [id number]  . OBESITY 09/01/2008  . Hypertensive heart disease with heart failure ([place]) 09/01/2008  . TOBACCO ABUSE  07/31/2007  . 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    CBC    Component Value Date/Time   WBC 6.8  12/05/2014 1536   RBC 4.37 12/05/2014 1536   HGB 13.5 12/05/2014 1536   HCT 40.7 12/05/2014 1536   PLT 136* 12/05/2014 1536   MCV 93.1 12/05/2014 1536   LYMPHSABS 1.8 12/05/2014 1536   MONOABS 0.5 12/05/2014 1536   EOSABS 0.3 12/05/2014 1536   BASOSABS 0.1 12/05/2014 1536    CMP     Component Value Date/Time   NA 136 12/05/2014 1536   K 4.1 12/05/2014 1536   CL 101 12/05/2014 1536   CO2 24 12/05/2014 1536   GLUCOSE 242* 12/05/2014 1536   BUN 27* 12/05/2014 1536   CREATININE 1.06 12/05/2014 1536   CREATININE 0.85 07/22/2012 0440   CALCIUM 9.1 12/05/2014 1536   PROT 6.3 07/22/2012 0440   ALBUMIN 2.9* 07/22/2012 0440   AST 17 07/22/2012 0440   ALT 26 07/22/2012 0440   ALKPHOS 45 07/22/2012 0440   BILITOT 0.3 07/22/2012 0440   GFRNONAA 84* 07/22/2012 0440   GFRAA >90 07/22/2012 0440    Assessment and Plan  DM (diabetes mellitus), type 2 with peripheral vascular complications Last [id number] , early 11/2014 was 10.5. This appears to be a compliance issue. I discussed this with him at length. I suggested perhaps eating a good diet 5 days a week and 2 days a week he can eat what he wants. I think i made a small impact. Will plan to discuss this with him again.    Time spent > 25 min;> 50% of time with patient was spent reviewing records, labs, tests and studies, counseling and developing plan of care  [name], MD

## 2015-01-21 NOTE — Assessment & Plan Note (Signed)
Last A1c , early 11/2014 was 10.5. This appears to be a compliance issue. I discussed this with him at length. I suggested perhaps eating a good diet 5 days a week and 2 days a week he can eat what he wants. I think i made a small impact. Will plan to discuss this with him again.

## 2015-01-26 ENCOUNTER — Emergency Department (HOSPITAL_COMMUNITY): Payer: Medicare Other

## 2015-01-26 ENCOUNTER — Telehealth (HOSPITAL_BASED_OUTPATIENT_CLINIC_OR_DEPARTMENT_OTHER): Payer: Self-pay | Admitting: Emergency Medicine

## 2015-01-26 ENCOUNTER — Inpatient Hospital Stay (HOSPITAL_COMMUNITY): Payer: Medicare Other

## 2015-01-26 ENCOUNTER — Inpatient Hospital Stay (HOSPITAL_COMMUNITY)
Admission: EM | Admit: 2015-01-26 | Discharge: 2015-02-02 | DRG: 260 | Disposition: A | Discharge status: Skilled Nursing Facility | Payer: Medicare Other | Attending: Internal Medicine | Admitting: Internal Medicine

## 2015-01-26 ENCOUNTER — Encounter (HOSPITAL_COMMUNITY): Payer: Self-pay | Admitting: Emergency Medicine

## 2015-01-26 DIAGNOSIS — E785 Hyperlipidemia, unspecified: Secondary | ICD-10-CM | POA: Diagnosis present

## 2015-01-26 DIAGNOSIS — Z7982 Long term (current) use of aspirin: Secondary | ICD-10-CM | POA: Diagnosis not present

## 2015-01-26 DIAGNOSIS — Z8673 Personal history of transient ischemic attack (TIA), and cerebral infarction without residual deficits: Secondary | ICD-10-CM

## 2015-01-26 DIAGNOSIS — E876 Hypokalemia: Secondary | ICD-10-CM | POA: Diagnosis not present

## 2015-01-26 DIAGNOSIS — Z8249 Family history of ischemic heart disease and other diseases of the circulatory system: Secondary | ICD-10-CM | POA: Diagnosis not present

## 2015-01-26 DIAGNOSIS — J9600 Acute respiratory failure, unspecified whether with hypoxia or hypercapnia: Secondary | ICD-10-CM

## 2015-01-26 DIAGNOSIS — K5190 Ulcerative colitis, unspecified, without complications: Secondary | ICD-10-CM | POA: Diagnosis present

## 2015-01-26 DIAGNOSIS — R112 Nausea with vomiting, unspecified: Secondary | ICD-10-CM

## 2015-01-26 DIAGNOSIS — I252 Old myocardial infarction: Secondary | ICD-10-CM | POA: Diagnosis not present

## 2015-01-26 DIAGNOSIS — R7881 Bacteremia: Secondary | ICD-10-CM

## 2015-01-26 DIAGNOSIS — G894 Chronic pain syndrome: Secondary | ICD-10-CM | POA: Diagnosis present

## 2015-01-26 DIAGNOSIS — F3175 Bipolar disorder, in partial remission, most recent episode depressed: Secondary | ICD-10-CM | POA: Diagnosis not present

## 2015-01-26 DIAGNOSIS — E872 Acidosis: Secondary | ICD-10-CM | POA: Diagnosis present

## 2015-01-26 DIAGNOSIS — E1151 Type 2 diabetes mellitus with diabetic peripheral angiopathy without gangrene: Secondary | ICD-10-CM | POA: Diagnosis present

## 2015-01-26 DIAGNOSIS — G9340 Encephalopathy, unspecified: Secondary | ICD-10-CM

## 2015-01-26 DIAGNOSIS — A419 Sepsis, unspecified organism: Secondary | ICD-10-CM

## 2015-01-26 DIAGNOSIS — Z950 Presence of cardiac pacemaker: Secondary | ICD-10-CM

## 2015-01-26 DIAGNOSIS — J9601 Acute respiratory failure with hypoxia: Secondary | ICD-10-CM | POA: Diagnosis not present

## 2015-01-26 DIAGNOSIS — A4102 Sepsis due to Methicillin resistant Staphylococcus aureus: Secondary | ICD-10-CM | POA: Diagnosis present

## 2015-01-26 DIAGNOSIS — J9621 Acute and chronic respiratory failure with hypoxia: Secondary | ICD-10-CM

## 2015-01-26 DIAGNOSIS — I10 Essential (primary) hypertension: Secondary | ICD-10-CM | POA: Diagnosis present

## 2015-01-26 DIAGNOSIS — Z794 Long term (current) use of insulin: Secondary | ICD-10-CM | POA: Diagnosis not present

## 2015-01-26 DIAGNOSIS — Z9889 Other specified postprocedural states: Secondary | ICD-10-CM | POA: Diagnosis not present

## 2015-01-26 DIAGNOSIS — L89609 Pressure ulcer of unspecified heel, unspecified stage: Secondary | ICD-10-CM | POA: Diagnosis present

## 2015-01-26 DIAGNOSIS — Z959 Presence of cardiac and vascular implant and graft, unspecified: Secondary | ICD-10-CM

## 2015-01-26 DIAGNOSIS — A4902 Methicillin resistant Staphylococcus aureus infection, unspecified site: Secondary | ICD-10-CM | POA: Diagnosis not present

## 2015-01-26 DIAGNOSIS — B9561 Methicillin susceptible Staphylococcus aureus infection as the cause of diseases classified elsewhere: Secondary | ICD-10-CM | POA: Diagnosis not present

## 2015-01-26 DIAGNOSIS — T827XXA Infection and inflammatory reaction due to other cardiac and vascular devices, implants and grafts, initial encounter: Secondary | ICD-10-CM | POA: Diagnosis present

## 2015-01-26 DIAGNOSIS — Z22322 Carrier or suspected carrier of Methicillin resistant Staphylococcus aureus: Secondary | ICD-10-CM | POA: Diagnosis not present

## 2015-01-26 DIAGNOSIS — Z79899 Other long term (current) drug therapy: Secondary | ICD-10-CM | POA: Diagnosis not present

## 2015-01-26 DIAGNOSIS — E1140 Type 2 diabetes mellitus with diabetic neuropathy, unspecified: Secondary | ICD-10-CM | POA: Diagnosis present

## 2015-01-26 DIAGNOSIS — G4700 Insomnia, unspecified: Secondary | ICD-10-CM | POA: Diagnosis present

## 2015-01-26 DIAGNOSIS — F319 Bipolar disorder, unspecified: Secondary | ICD-10-CM | POA: Diagnosis present

## 2015-01-26 DIAGNOSIS — I255 Ischemic cardiomyopathy: Secondary | ICD-10-CM | POA: Diagnosis present

## 2015-01-26 DIAGNOSIS — Z66 Do not resuscitate: Secondary | ICD-10-CM | POA: Diagnosis present

## 2015-01-26 DIAGNOSIS — Z833 Family history of diabetes mellitus: Secondary | ICD-10-CM

## 2015-01-26 DIAGNOSIS — N179 Acute kidney failure, unspecified: Secondary | ICD-10-CM | POA: Diagnosis present

## 2015-01-26 DIAGNOSIS — Z885 Allergy status to narcotic agent status: Secondary | ICD-10-CM | POA: Diagnosis not present

## 2015-01-26 DIAGNOSIS — Z888 Allergy status to other drugs, medicaments and biological substances status: Secondary | ICD-10-CM | POA: Diagnosis not present

## 2015-01-26 DIAGNOSIS — R509 Fever, unspecified: Secondary | ICD-10-CM | POA: Diagnosis not present

## 2015-01-26 DIAGNOSIS — F0390 Unspecified dementia without behavioral disturbance: Secondary | ICD-10-CM | POA: Diagnosis present

## 2015-01-26 DIAGNOSIS — G4733 Obstructive sleep apnea (adult) (pediatric): Secondary | ICD-10-CM | POA: Diagnosis present

## 2015-01-26 DIAGNOSIS — N400 Enlarged prostate without lower urinary tract symptoms: Secondary | ICD-10-CM | POA: Diagnosis present

## 2015-01-26 DIAGNOSIS — R1310 Dysphagia, unspecified: Secondary | ICD-10-CM | POA: Diagnosis present

## 2015-01-26 DIAGNOSIS — I5022 Chronic systolic (congestive) heart failure: Secondary | ICD-10-CM | POA: Diagnosis present

## 2015-01-26 DIAGNOSIS — G47419 Narcolepsy without cataplexy: Secondary | ICD-10-CM | POA: Diagnosis present

## 2015-01-26 DIAGNOSIS — T827XXD Infection and inflammatory reaction due to other cardiac and vascular devices, implants and grafts, subsequent encounter: Secondary | ICD-10-CM | POA: Diagnosis not present

## 2015-01-26 DIAGNOSIS — R4182 Altered mental status, unspecified: Secondary | ICD-10-CM | POA: Insufficient documentation

## 2015-01-26 DIAGNOSIS — I639 Cerebral infarction, unspecified: Secondary | ICD-10-CM | POA: Diagnosis present

## 2015-01-26 DIAGNOSIS — Z951 Presence of aortocoronary bypass graft: Secondary | ICD-10-CM

## 2015-01-26 DIAGNOSIS — Z87891 Personal history of nicotine dependence: Secondary | ICD-10-CM

## 2015-01-26 DIAGNOSIS — A4101 Sepsis due to Methicillin susceptible Staphylococcus aureus: Secondary | ICD-10-CM

## 2015-01-26 DIAGNOSIS — R0902 Hypoxemia: Secondary | ICD-10-CM

## 2015-01-26 DIAGNOSIS — I25810 Atherosclerosis of coronary artery bypass graft(s) without angina pectoris: Secondary | ICD-10-CM | POA: Diagnosis not present

## 2015-01-26 DIAGNOSIS — M109 Gout, unspecified: Secondary | ICD-10-CM | POA: Diagnosis present

## 2015-01-26 DIAGNOSIS — I25709 Atherosclerosis of coronary artery bypass graft(s), unspecified, with unspecified angina pectoris: Secondary | ICD-10-CM | POA: Diagnosis present

## 2015-01-26 DIAGNOSIS — Y831 Surgical operation with implant of artificial internal device as the cause of abnormal reaction of the patient, or of later complication, without mention of misadventure at the time of the procedure: Secondary | ICD-10-CM | POA: Diagnosis present

## 2015-01-26 DIAGNOSIS — I2510 Atherosclerotic heart disease of native coronary artery without angina pectoris: Secondary | ICD-10-CM | POA: Diagnosis present

## 2015-01-26 HISTORY — DX: Acute respiratory failure, unspecified whether with hypoxia or hypercapnia: J96.00

## 2015-01-26 HISTORY — DX: Acute kidney failure, unspecified: N17.9

## 2015-01-26 LAB — I-STAT CG4 LACTIC ACID, ED
LACTIC ACID, VENOUS: 3 mmol/L — AB (ref 0.5–2.0)
LACTIC ACID, VENOUS: 1.89 mmol/L (ref 0.5–2.0)

## 2015-01-26 LAB — COMPREHENSIVE METABOLIC PANEL
ALBUMIN: 3.1 g/dL — AB (ref 3.5–5.0)
ALT: 33 U/L (ref 17–63)
ANION GAP: 14 (ref 5–15)
AST: 26 U/L (ref 15–41)
Alkaline Phosphatase: 44 U/L (ref 38–126)
BUN: 16 mg/dL (ref 6–20)
CHLORIDE: 103 mmol/L (ref 101–111)
CO2: 23 mmol/L (ref 22–32)
Calcium: 9 mg/dL (ref 8.9–10.3)
Creatinine, Ser: 1.26 mg/dL — ABNORMAL HIGH (ref 0.61–1.24)
GFR calc Af Amer: >60 mL/min (ref >60)
GFR calc non Af Amer: 54 mL/min — ABNORMAL LOW (ref >60)
GLUCOSE: 414 mg/dL — AB (ref 65–99)
POTASSIUM: 3.8 mmol/L (ref 3.5–5.1)
SODIUM: 140 mmol/L (ref 135–145)
Total Bilirubin: 1 mg/dL (ref 0.3–1.2)
Total Protein: 6.3 g/dL — ABNORMAL LOW (ref 6.5–8.1)

## 2015-01-26 LAB — CBC WITH DIFFERENTIAL/PLATELET
BASOS ABS: 0 10*3/uL (ref 0.0–0.1)
Basophils Relative: 0 %
EOS PCT: 0 %
Eosinophils Absolute: 0 10*3/uL (ref 0.0–0.7)
HEMATOCRIT: 44.8 % (ref 39.0–52.0)
Hemoglobin: 15 g/dL (ref 13.0–17.0)
LYMPHS ABS: 0.7 10*3/uL (ref 0.7–4.0)
LYMPHS PCT: 5 %
MCH: 31.1 pg (ref 26.0–34.0)
MCHC: 33.5 g/dL (ref 30.0–36.0)
MCV: 92.9 fL (ref 78.0–100.0)
MONO ABS: 0.9 10*3/uL (ref 0.1–1.0)
MONOS PCT: 7 %
NEUTROS ABS: 10.5 10*3/uL — AB (ref 1.7–7.7)
Neutrophils Relative %: 88 %
Platelets: 142 10*3/uL — ABNORMAL LOW (ref 150–400)
RBC: 4.82 MIL/uL (ref 4.22–5.81)
RDW: 13.7 % (ref 11.5–15.5)
WBC: 12.1 10*3/uL — ABNORMAL HIGH (ref 4.0–10.5)

## 2015-01-26 LAB — URINE MICROSCOPIC-ADD ON

## 2015-01-26 LAB — URINALYSIS, ROUTINE W REFLEX MICROSCOPIC
Bilirubin Urine: NEGATIVE
Ketones, ur: 15 mg/dL — AB
LEUKOCYTES UA: NEGATIVE
NITRITE: NEGATIVE
PH: 5.5 (ref 5.0–8.0)
Protein, ur: >300 mg/dL — AB
SPECIFIC GRAVITY, URINE: 1.038 — AB (ref 1.005–1.030)

## 2015-01-26 LAB — I-STAT TROPONIN, ED: Troponin i, poc: 0 ng/mL (ref 0.00–0.08)

## 2015-01-26 LAB — LIPASE, BLOOD: Lipase: 19 U/L (ref 11–51)

## 2015-01-26 LAB — PROCALCITONIN: Procalcitonin: 8.91 ng/mL

## 2015-01-26 LAB — LACTIC ACID, PLASMA: Lactic Acid, Venous: 2.8 mmol/L (ref 0.5–2.0)

## 2015-01-26 LAB — INFLUENZA PANEL BY PCR (TYPE A & B)
H1N1FLUPCR: NOT DETECTED
Influenza A By PCR: NEGATIVE
Influenza B By PCR: NEGATIVE

## 2015-01-26 LAB — GLUCOSE, CAPILLARY
GLUCOSE-CAPILLARY: 402 mg/dL — AB (ref 65–99)
Glucose-Capillary: 332 mg/dL — ABNORMAL HIGH (ref 65–99)

## 2015-01-26 LAB — CBG MONITORING, ED: GLUCOSE-CAPILLARY: 435 mg/dL — AB (ref 65–99)

## 2015-01-26 LAB — APTT: APTT: 29 s (ref 24–37)

## 2015-01-26 LAB — PROTIME-INR
INR: 1.14 (ref 0.00–1.49)
PROTHROMBIN TIME: 14.8 s (ref 11.6–15.2)

## 2015-01-26 MED ORDER — ACETAMINOPHEN 325 MG PO TABS
650.0000 mg | ORAL_TABLET | Freq: Four times a day (QID) | ORAL | Status: DC | PRN
  Administered 2015-02-02: 650 mg via ORAL
  Filled 2015-01-26: qty 2

## 2015-01-26 MED ORDER — SODIUM CHLORIDE 0.9 % IV BOLUS (SEPSIS): 1000.0000 mL | INTRAVENOUS | Status: AC

## 2015-01-26 MED ORDER — CLONAZEPAM 0.5 MG PO TABS: 0.5000 mg | ORAL_TABLET | ORAL | Status: DC

## 2015-01-26 MED ORDER — LAMOTRIGINE 100 MG PO TABS
100.0000 mg | ORAL_TABLET | Freq: Every day | ORAL | Status: DC
  Administered 2015-01-26 – 2015-02-02 (×8): 100 mg via ORAL
  Filled 2015-01-26 (×8): qty 1

## 2015-01-26 MED ORDER — ASPIRIN 81 MG PO CHEW
81.0000 mg | CHEWABLE_TABLET | Freq: Every day | ORAL | Status: DC
  Administered 2015-01-26 – 2015-02-01 (×7): 81 mg via ORAL
  Filled 2015-01-26 (×7): qty 1

## 2015-01-26 MED ORDER — ONDANSETRON HCL 4 MG PO TABS: 4.0000 mg | ORAL_TABLET | Freq: Four times a day (QID) | ORAL | Status: DC | PRN

## 2015-01-26 MED ORDER — GABAPENTIN 100 MG PO CAPS
100.0000 mg | ORAL_CAPSULE | Freq: Every day | ORAL | Status: DC
  Administered 2015-01-26 – 2015-02-01 (×7): 100 mg via ORAL
  Filled 2015-01-26 (×7): qty 1

## 2015-01-26 MED ORDER — SODIUM CHLORIDE 0.9 % IV SOLN
1250.0000 mg | INTRAVENOUS | Status: DC
Start: 2015-01-27 — End: 2015-01-27
  Administered 2015-01-27: 1250 mg via INTRAVENOUS
  Filled 2015-01-26 (×2): qty 1250

## 2015-01-26 MED ORDER — TRAMADOL HCL 50 MG PO TABS: 50.0000 mg | ORAL_TABLET | Freq: Two times a day (BID) | ORAL | Status: DC | PRN

## 2015-01-26 MED ORDER — SODIUM CHLORIDE 0.9 % IV SOLN
INTRAVENOUS | Status: DC
  Administered 2015-01-26 – 2015-01-28 (×3): via INTRAVENOUS
  Administered 2015-01-29: 75 mL/h via INTRAVENOUS
  Administered 2015-01-30: via INTRAVENOUS

## 2015-01-26 MED ORDER — DOXAZOSIN MESYLATE 1 MG PO TABS
1.0000 mg | ORAL_TABLET | Freq: Every day | ORAL | Status: DC
  Administered 2015-01-26 – 2015-02-01 (×7): 1 mg via ORAL
  Filled 2015-01-26 (×11): qty 1

## 2015-01-26 MED ORDER — CLONAZEPAM 0.5 MG PO TABS
0.5000 mg | ORAL_TABLET | Freq: Every day | ORAL | Status: DC
  Administered 2015-01-26 – 2015-02-01 (×7): 0.5 mg via ORAL
  Filled 2015-01-26 (×7): qty 1

## 2015-01-26 MED ORDER — INSULIN ASPART 100 UNIT/ML ~~LOC~~ SOLN
0.0000 [IU] | Freq: Every day | SUBCUTANEOUS | Status: DC
  Administered 2015-01-26: 4 [IU] via SUBCUTANEOUS
  Administered 2015-01-27: 3 [IU] via SUBCUTANEOUS
  Administered 2015-01-28 – 2015-02-01 (×5): 2 [IU] via SUBCUTANEOUS

## 2015-01-26 MED ORDER — INSULIN ASPART 100 UNIT/ML ~~LOC~~ SOLN
0.0000 [IU] | Freq: Three times a day (TID) | SUBCUTANEOUS | Status: DC
  Administered 2015-01-26: 15 [IU] via SUBCUTANEOUS
  Administered 2015-01-27: 11 [IU] via SUBCUTANEOUS
  Administered 2015-01-27: 15 [IU] via SUBCUTANEOUS
  Administered 2015-01-27: 11 [IU] via SUBCUTANEOUS
  Administered 2015-01-28: 8 [IU] via SUBCUTANEOUS
  Administered 2015-01-28 – 2015-01-30 (×7): 5 [IU] via SUBCUTANEOUS
  Administered 2015-01-31: 3 [IU] via SUBCUTANEOUS
  Administered 2015-01-31 – 2015-02-01 (×3): 5 [IU] via SUBCUTANEOUS
  Administered 2015-02-01: 8 [IU] via SUBCUTANEOUS
  Administered 2015-02-01 – 2015-02-02 (×2): 5 [IU] via SUBCUTANEOUS
  Administered 2015-02-02: 8 [IU] via SUBCUTANEOUS
  Administered 2015-02-02: 5 [IU] via SUBCUTANEOUS
  Filled 2015-01-26: qty 1

## 2015-01-26 MED ORDER — CETYLPYRIDINIUM CHLORIDE 0.05 % MT LIQD
7.0000 mL | Freq: Two times a day (BID) | OROMUCOSAL | Status: DC
  Administered 2015-01-26 – 2015-02-02 (×12): 7 mL via OROMUCOSAL

## 2015-01-26 MED ORDER — SODIUM CHLORIDE 0.9 % IV BOLUS (SEPSIS)
500.0000 mL | INTRAVENOUS | Status: AC
Start: 2015-01-26 — End: 2015-01-26

## 2015-01-26 MED ORDER — SODIUM CHLORIDE 0.9 % IJ SOLN
3.0000 mL | Freq: Two times a day (BID) | INTRAMUSCULAR | Status: DC
Start: 2015-01-26 — End: 2015-02-02
  Administered 2015-01-26 – 2015-02-01 (×10): 3 mL via INTRAVENOUS

## 2015-01-26 MED ORDER — CLONAZEPAM 0.5 MG PO TABS
0.5000 mg | ORAL_TABLET | Freq: Three times a day (TID) | ORAL | Status: DC | PRN
  Administered 2015-01-29: 0.5 mg via ORAL
  Filled 2015-01-26: qty 1

## 2015-01-26 MED ORDER — SODIUM CHLORIDE 0.9 % IV BOLUS (SEPSIS)
1000.0000 mL | INTRAVENOUS | Status: AC
  Administered 2015-01-26 (×3): 1000 mL via INTRAVENOUS

## 2015-01-26 MED ORDER — INSULIN GLARGINE 100 UNIT/ML ~~LOC~~ SOLN
18.0000 [IU] | Freq: Two times a day (BID) | SUBCUTANEOUS | Status: DC
  Administered 2015-01-27 (×2): 18 [IU] via SUBCUTANEOUS
  Filled 2015-01-26 (×5): qty 0.18

## 2015-01-26 MED ORDER — IOHEXOL 300 MG/ML  SOLN
100.0000 mL | Freq: Once | INTRAMUSCULAR | Status: AC | PRN
  Administered 2015-01-26: 100 mL via INTRAVENOUS

## 2015-01-26 MED ORDER — TRAZODONE HCL 50 MG PO TABS
50.0000 mg | ORAL_TABLET | Freq: Every day | ORAL | Status: DC
  Administered 2015-01-26 – 2015-02-01 (×7): 50 mg via ORAL
  Filled 2015-01-26 (×7): qty 1

## 2015-01-26 MED ORDER — ENOXAPARIN SODIUM 40 MG/0.4ML ~~LOC~~ SOLN
40.0000 mg | SUBCUTANEOUS | Status: DC
  Administered 2015-01-26 – 2015-01-27 (×2): 40 mg via SUBCUTANEOUS
  Filled 2015-01-26 (×3): qty 0.4

## 2015-01-26 MED ORDER — SODIUM CHLORIDE 0.9 % IV BOLUS (SEPSIS)
500.0000 mL | Freq: Once | INTRAVENOUS | Status: AC
  Administered 2015-01-26: 500 mL via INTRAVENOUS

## 2015-01-26 MED ORDER — VANCOMYCIN HCL IN DEXTROSE 1-5 GM/200ML-% IV SOLN
1000.0000 mg | Freq: Once | INTRAVENOUS | Status: AC
  Administered 2015-01-26: 1000 mg via INTRAVENOUS
  Filled 2015-01-26: qty 200

## 2015-01-26 MED ORDER — PIPERACILLIN-TAZOBACTAM 3.375 G IVPB
3.3750 g | Freq: Three times a day (TID) | INTRAVENOUS | Status: DC
  Administered 2015-01-27 – 2015-01-28 (×4): 3.375 g via INTRAVENOUS
  Filled 2015-01-26 (×7): qty 50

## 2015-01-26 MED ORDER — ACETAMINOPHEN 650 MG RE SUPP: 650.0000 mg | Freq: Four times a day (QID) | RECTAL | Status: DC | PRN

## 2015-01-26 MED ORDER — OXYCODONE HCL 5 MG PO TABS
5.0000 mg | ORAL_TABLET | Freq: Four times a day (QID) | ORAL | Status: DC
  Administered 2015-01-26 – 2015-01-29 (×10): 5 mg via ORAL
  Filled 2015-01-26 (×10): qty 1

## 2015-01-26 MED ORDER — ONDANSETRON HCL 4 MG/2ML IJ SOLN: 4.0000 mg | Freq: Four times a day (QID) | INTRAMUSCULAR | Status: DC | PRN

## 2015-01-26 MED ORDER — VANCOMYCIN HCL IN DEXTROSE 1-5 GM/200ML-% IV SOLN
1000.0000 mg | Freq: Once | INTRAVENOUS | Status: DC
Start: 2015-01-26 — End: 2015-01-28
  Filled 2015-01-26 (×2): qty 200

## 2015-01-26 MED ORDER — PIPERACILLIN-TAZOBACTAM 3.375 G IVPB 30 MIN
3.3750 g | Freq: Once | INTRAVENOUS | Status: AC
  Administered 2015-01-26: 3.375 g via INTRAVENOUS
  Filled 2015-01-26: qty 50

## 2015-01-26 NOTE — ED Notes (Addendum)
Per EMS: pt from SNF [facility] c/o fever, N/V, and AMS; pt given 650mg  tylenol by facility; pt given 4mg  zofran; 18g L AC; pt had fever 103.9 at SNF; pt with low O2 sats

## 2015-01-26 NOTE — Progress Notes (Signed)
Pharmacy Code Sepsis Protocol  Time of code sepsis page: 1129 Time of antibiotic delivery: 1154 (Abx ordered at 1140)  Were antibiotics ordered at the time of the code sepsis page? No   Was it required to contact the physician? [x]  Physician not contacted []  Physician contacted to order antibiotics for code sepsis []  Physician contacted to recommend changing antibiotics  Pharmacy consulted for: Zosyn and vancomycin  Anti-infectives    None      @infusions @   Nurse education provided: [x]  Minutes left to administer antibiotics to achieve 1 hour goal [x]  Correct order of antibiotic administration [x]  Antibiotic Y-site compatibilities     Elenor Quinones, PharmD, BCPS Clinical Pharmacist Pager [phone] 01/26/2015 11:53 AM

## 2015-01-26 NOTE — ED Notes (Signed)
Admitting phy notified of CBG and told to give 15 units

## 2015-01-26 NOTE — ED Notes (Signed)
Patient transported to CT 

## 2015-01-26 NOTE — ED Provider Notes (Signed)
I saw and evaluated the patient, reviewed the resident's note and I agree with the findings and plan.  Pertinent History: The patient is a 75 year old male, he is from a nursing facility, nursing reported this morning that the patient had altered mental status and a fever. Paramedics or the primary historians as the patient is unable to give any other information, level V caveat applies. They did report having hypoxia on room air. Pertinent Exam findings: On exam the patient has subtle rales at the bases but does not appear to be in respiratory distress. There is minimal bilateral symmetrical pitting edema, no JVD, oropharynx appears to have some dry mucous membranes with some white streaking on the tongue, abdomen possibly tender as the patient does have a mild grimace with examination. Fevers present, no tachycardia, he is hypoxic to 89% on room air requiring supplemental oxygen. Anticipate sepsis workup, had influenza testing, anticipate admission. The patient does appear critically ill with fever hypoxia and altered mental status. Based on Q SOFA - Septic.  Abx   EKG Interpretation  Date/Time:  Monday January 26 2015 11:21:55 EST Ventricular Rate:  70 PR Interval:    QRS Duration: 153 QT Interval:  439 QTC Calculation: 474 R Axis:   -169 Text Interpretation:  Complete AV block with wide QRS complex ATRIAL PACED RHYTHM Abnormal ekg Since last tracing axis different Confirmed by Shatona Andujar  MD, Mithcell Schumpert ([id number]) on 01/26/2015 11:28:16 AM      CRITICAL CARE Performed by: Noemi Chapel D Total critical care time: 35 minutes Critical care time was exclusive of separately billable procedures and treating other patients. Critical care was necessary to treat or prevent imminent or life-threatening deterioration. Critical care was time spent personally by me on the following activities: development of treatment plan with patient and/or surrogate as well as nursing, discussions with consultants, evaluation of  patient's response to treatment, examination of patient, obtaining history from patient or surrogate, ordering and performing treatments and interventions, ordering and review of laboratory studies, ordering and review of radiographic studies, pulse oximetry and re-evaluation of patient's condition.   I personally interpreted the EKG as well as the resident and agree with the interpretation on the resident's chart.  Final diagnoses:  Fever, unspecified fever cause  Hypoxia  Altered mental status, unspecified altered mental status type  Nausea & vomiting  Sepsis (HCC)      Noemi Chapel, MD 01/27/15 1151
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
## 2015-01-26 NOTE — ED Provider Notes (Signed)
CSN: [id number]     Arrival date & time 01/26/15  1121 History   First MD Initiated Contact with Patient 01/26/15 1115     Chief Complaint  Patient presents with  . Fever  . 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 Nurses there reported when the checked on him this AM he was pale, diaphoretic, febrile to 103.9, and tachycardic. EMS arrived and pt vomited NBNB twice. Pt was altered with them and could not contribute to history. They reported hypoxia in upper 80s with RA. Placed on [place] with sats in the low 90s in route.  Pt denies CP, SOB, fever, chills, N/V, diarrhea, constipation, hematemesis, dysuria, hematuria, sick contacts, or recent travel.   (Consider location/radiation/quality/duration/timing/severity/associated sxs/prior Treatment) Patient is a 75 y.o. male presenting with fever and vomiting.  Fever Max temp prior to arrival:  103.9 Onset quality:  Sudden Duration:  1 day Timing:  Constant Chronicity:  New Relieved by:  Nothing Worsened by:  Nothing tried Ineffective treatments:  None tried Associated symptoms: vomiting   Associated symptoms: no chest pain, no chills, no diarrhea, no dysuria, no headaches and no nausea   Risk factors: no recent travel   Emesis Associated symptoms: no abdominal pain, no chills, no diarrhea and no headaches     Past Medical History  Diagnosis Date  . CAD (coronary artery disease)     s/p 4V CABG 10/15/08; EF is 50%  . Obesity   . BPH (benign prostatic hypertrophy)   . Edema   . Left ventricular systolic dysfunction     EF is 40% per echo December 2012  . AMI, INFERIOR WALL 09/30/2008    Qualifier: Diagnosis of  By: Unk Lightning, RN, BSN, Melanie    . CAD, ARTERY BYPASS GRAFT 11/18/2008    Qualifier: Diagnosis of  By: Angelena Form, MD, Harrell Gave    . Other specified forms of chronic ischemic  heart disease 05/19/2009    Centricity Description: CARDIOMYOPATHY, ISCHEMIC Qualifier: Diagnosis of  By: Angelena Form, MD, Christopher   Centricity Description: OTHER SPEC FORMS CHRONIC ISCHEMIC HEART DISEASE Qualifier: Diagnosis of  By: Shelda Pal    . UNIVERSAL ULCERATIVE COLITIS 07/31/2007    Qualifier: Diagnosis of  By: [name] MD, [name]   . Ischemic cardiomyopathy 05/05/2011  . DM 09/01/2008    Qualifier: Diagnosis of  By: Burnett Kanaris    . OBSTRUCTIVE SLEEP APNEA 09/16/2008    Qualifier: Diagnosis of  By: [name] MD, [name]   . HYPERTENSION 09/01/2008    Qualifier: Diagnosis of  By: Burnett Kanaris    . Hyperlipidemia 05/18/2010  . Gout 09/23/2011  . Bipolar affective disorder ([place]) 09/23/2011  . TIA (transient ischemic attack) 09/23/2011  . Chronic pain 09/23/2011  . DNR (do not resuscitate) 09/23/2011    Per July 2011 [place] admission  . CVA (cerebral infarction) 09/23/2011    Small left occipital  . 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 History of alcohol abuse 09/23/2011  . Blood in stool   . Depression   . Urine incontinence   . Stroke [facility])     "mini strokes"  . Incontinence of urine   . Neuromuscular disorder ([place])     Diabetic neuropathy  . [facility] spotted fever   . Benign neoplasm of colon 03/2011    Adenomatous polyps  .  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 Lovena Le 12/12/2014   Past Surgical History  Procedure Laterality Date  . Coronary artery bypass graft      Median sternotomy, extracorporeal circulation,  coronary artery bypass graft surgery x4 using a sequential left internal   mammary artery graft to the mid and distal left anterior descending, a   saphenous vein graft to diagonal branch of the left anterior descending,   and a saphenous vein graft to the obtuse marginal branch of left   circumflex coronary artery.    . Cardiac catheterization      Triple-vessel coronary artery disease. Low-normal left  ventricular systolic function with mild   anteroapical wall motion abnormality.   . Tonsilectomy, adenoidectomy, bilateral myringotomy and tubes  1946  . Multiple extractions with alveoloplasty  12/01/2011    Procedure: MULTIPLE EXTRACION WITH ALVEOLOPLASTY;  Surgeon: Lenn Cal, DDS;  Location: [place];  Service: Oral Surgery;  Laterality: N/A;  Extraction of tooth #'s 3,4,5,6,7,8,9,10,11,12,13,14,15,16,17,20,21,22,23,24,25,26,27,28,29,30,31,32 with alveoloplasty and bilateral mandibular lingual tori  . Flexible sigmoidoscopy  01/17/2012    Procedure: FLEXIBLE SIGMOIDOSCOPY;  Surgeon: Lafayette Dragon, MD;  Location: [facility];  Service: Endoscopy;  Laterality: N/A;  . Ep implantable device N/A 12/12/2014    Procedure: Pacemaker Implant;  Surgeon: [name], MD;  Location: [place] [facility];  Service: Cardiovascular;  Laterality: N/A;   Family History  Problem Relation Age of Onset  . Diabetes Father   . Heart disease Father   . Heart attack Mother   . Aortic aneurysm Mother   . Alcohol abuse Other   . Arthritis Other   . Hyperlipidemia Other   . Heart disease Other   . Stroke Other   . Hypertension Other   . Diabetes Other   . Mental illness Other   . Alcohol abuse Other   . Arthritis Other   . Heart disease Other   . Mental illness Other   . Diabetes Other    Social History  Substance Use Topics  . Smoking status: Former Smoker    Quit date: 01/18/2008  . Smokeless tobacco: Never Used  . Alcohol Use: No    Review of Systems  Unable to perform ROS: Mental status change  Constitutional: Negative for fever and chills.  Respiratory: Negative for shortness of breath.   Cardiovascular: Negative for chest pain, palpitations and leg swelling.  Gastrointestinal: Positive for vomiting. Negative for nausea, abdominal pain, diarrhea, constipation and abdominal distention.  Genitourinary: Negative for dysuria, frequency, flank pain and decreased urine volume.   Neurological: Negative for dizziness, speech difficulty, light-headedness and headaches.  All other systems reviewed and are negative.     Allergies  Dexedrine and Oxycodone  Home Medications   Prior to Admission medications   Medication Sig Start Date End Date Taking? Authorizing Provider  acetaminophen (TYLENOL) 325 MG tablet Take 650 mg by mouth every 4 (four) hours as needed (pain). Do not exceed 4 gm of acetaminophen in 24 hours   Yes Historical Provider, MD  aspirin 81 MG chewable tablet Chew 81 mg by mouth at bedtime.   Yes Historical Provider, MD  clonazePAM (KLONOPIN) 0.5 MG tablet Take 0.5 mg by mouth See admin instructions. Take 1 tablet (0.5 mg) by mouth daily at bedtime, may also take 1 tablet (0.5 mg) 3 times daily as needed for anxiety   Yes Historical Provider, MD  doxazosin (  CARDURA) 1 MG tablet Take 1 mg by mouth at bedtime.    Yes Historical Provider, MD  gabapentin (NEURONTIN) 100 MG capsule Take 100 mg by mouth at bedtime.   Yes Historical Provider, MD  guaifenesin (ROBITUSSIN) 100 MG/5ML syrup Take 100 mg by mouth every 6 (six) hours as needed for cough.    Yes Historical Provider, MD  insulin glargine (LANTUS) 100 UNIT/ML injection Inject 35 Units into the skin 2 (two) times daily. 10am and 5pm   Yes Historical Provider, MD  insulin lispro (HUMALOG KWIKPEN) 100 UNIT/ML KiwkPen Inject 4-10 Units into the skin 4 (four) times daily. Per sliding scale: CBG 201-25 4 units, 251-300 6 units, 301-350 8 units, 351-400 10 units, >400 12 units and call MD (at 6:30am, 11:30am, 4:30 pm, 9pm)   Yes Historical Provider, MD  lamoTRIgine (LAMICTAL) 100 MG tablet Take 100 mg by mouth daily. 10am   Yes Historical Provider, MD  loperamide (IMODIUM) 2 MG capsule Take 4 mg by mouth as needed (after each loose stool).  01/19/12  Yes Oswald Hillock, MD  nitroGLYCERIN (NITROSTAT) 0.4 MG SL tablet Place 0.4 mg under the tongue every 5 (five) minutes as needed for chest pain.   Yes Historical  Provider, MD  oxyCODONE (OXY IR/ROXICODONE) 5 MG immediate release tablet Take 5 mg by mouth every 6 (six) hours.  12/03/14  Yes Historical Provider, MD  oxyCODONE (OXY IR/ROXICODONE) 5 MG immediate release tablet Take 5 mg by mouth 2 (two) times daily as needed for severe pain (pain).   Yes Historical Provider, MD  traMADol (ULTRAM) 50 MG tablet Take 50 mg by mouth every 12 (twelve) hours as needed (pain).    Yes Historical Provider, MD  traZODone (DESYREL) 50 MG tablet Take 50 mg by mouth at bedtime.   Yes Historical Provider, MD  Vitamin D, Ergocalciferol, (DRISDOL) 50000 units CAPS capsule Take 50,000 Units by mouth every 7 (seven) days.   Yes Historical Provider, MD   BP 115/70 mmHg  Pulse 88  Temp(Src) 98.6 F (37 C) (Oral)  Resp 15  Wt 106.595 kg  SpO2 96% Physical Exam  Constitutional: He appears well-developed and well-nourished. No distress.  HENT:  Head: Normocephalic and atraumatic.  Tongue dry: has a geographic tongue and aphthous ulcerating appearance to it.   Eyes: Pupils are equal, round, and reactive to light.  Neck: Normal range of motion.  Cardiovascular: Normal rate, regular rhythm, normal heart sounds and intact distal pulses.  Exam reveals no gallop and no friction rub.   No murmur heard. Pulmonary/Chest: Effort normal and breath sounds normal. No respiratory distress. He has no wheezes. He has no rales. He exhibits no tenderness.  Abdominal: Soft. Bowel sounds are normal. He exhibits no distension and no mass. There is tenderness. There is no rebound and no guarding.  generalized  Musculoskeletal: Normal range of motion.  Lymphadenopathy:    He has no cervical adenopathy.  Neurological: He is alert. No cranial nerve deficit. Coordination normal.  Oriented to person and place, confused about time.  Skin: Skin is warm and dry. He is not diaphoretic.  Nursing note and vitals reviewed.   ED Course  Procedures (including critical care time) Labs Review Labs  Reviewed  COMPREHENSIVE METABOLIC PANEL - Abnormal; Notable for the following:    Glucose, Bld 414 (*)    Creatinine, Ser 1.26 (*)    Total Protein 6.3 (*)    Albumin 3.1 (*)    GFR calc non Af Amer 54 (*)  All other components within normal limits  CBC WITH DIFFERENTIAL/PLATELET - Abnormal; Notable for the following:    WBC 12.1 (*)    Platelets 142 (*)    Neutro Abs 10.5 (*)    All other components within normal limits  URINALYSIS, ROUTINE W REFLEX MICROSCOPIC (NOT AT [facility]) - Abnormal; Notable for the following:    Specific Gravity, Urine 1.038 (*)    Glucose, UA >1000 (*)    Hgb urine dipstick MODERATE (*)    Ketones, ur 15 (*)    Protein, ur >300 (*)    All other components within normal limits  URINE MICROSCOPIC-ADD ON - Abnormal; Notable for the following:    Squamous Epithelial / LPF 0-5 (*)    Bacteria, UA RARE (*)    Crystals URIC ACID CRYSTALS (*)    All other components within normal limits  I-STAT CG4 LACTIC ACID, ED - Abnormal; Notable for the following:    Lactic Acid, Venous 3.00 (*)    All other components within normal limits  CULTURE, BLOOD (ROUTINE X 2)  CULTURE, BLOOD (ROUTINE X 2)  URINE CULTURE  LIPASE, BLOOD  INFLUENZA PANEL BY PCR (TYPE A & B, H1N1)  I-STAT TROPOININ, ED  I-STAT CG4 LACTIC ACID, ED    Imaging Review Dg Chest Port 1 View  01/26/2015  CLINICAL DATA:  Fever EXAM: PORTABLE CHEST 1 VIEW COMPARISON:  12/13/2014 FINDINGS: Cardiomegaly again noted. Study is limited by patient's large body habitus. Status post CABG. Dual lead cardiac pacemaker is unchanged in position. No pulmonary edema. Bilateral basilar atelectasis. No segmental infiltrates. Lung bases are partially obscured by patient's upper abdomen. IMPRESSION: Limited exam by patient's large body habitus. No gross infiltrate or pulmonary edema. Bilateral basilar atelectasis. Dual lead cardiac pacemaker is unchanged in position. Electronically Signed   By: Lahoma Crocker M.D.   On: 01/26/2015  11:39   I have personally reviewed and evaluated these images and lab results as part of my medical decision-making.   EKG Interpretation   Date/Time:  Monday January 26 2015 11:21:55 EST Ventricular Rate:  70 PR Interval:    QRS Duration: 153 QT Interval:  439 QTC Calculation: 474 R Axis:   -169 Text Interpretation:  Complete AV block with wide QRS complex ATRIAL PACED  RHYTHM Abnormal ekg Since last tracing axis different Confirmed by MILLER   MD, BRIAN ([id number]) on 01/26/2015 11:28:16 AM      MDM   Final diagnoses:  Fever, unspecified fever cause  Hypoxia  Altered mental status, unspecified altered mental status type   75 yo M w/febrile illness and AMS. See HPI for details. On exam, NAD. Febrile at 102.8. Intermittently paced at 70 with runs of tachycardia in the 120s. Concern for sepsis, SIRS + and qSOFA +. Unclear source. Labs sent. Broad spectrum Abx given. Pt has generalized abd pain: with N/V, will obtain belly labs and ct abd/pelvis.  Lactate 3-->30 cc/kg NS resuscitation. Lactate normalized.  Urine w/no obvious UTI: has WBCs but very few bacteria.   Viral? Abd etiology? No GI symptoms (N/V/D), no sign of SBO, cholecystitis, appendicitis, pyelo. Awaiting CT abdomen. Admit to hospital for con't workup and treatment.   Pt was seen under the supervision of Dr. Sabra Heck.     Sherian Maroon, MD 01/26/15 1537  Noemi Chapel, MD 01/27/15 [phone]

## 2015-01-26 NOTE — ED Notes (Signed)
Report called to rn on 2c rm 14

## 2015-01-26 NOTE — Plan of Care (Signed)
Problem: Fluid Volume: Goal: Hemodynamic stability will improve Outcome: Progressing IVF Boluses given in ER department

## 2015-01-26 NOTE — Progress Notes (Signed)
ANTIBIOTIC CONSULT NOTE - INITIAL  Pharmacy Consult for Zosyn and vancomyicn Indication: sepsis  Allergies  Allergen Reactions  . Dexedrine [Dextroamphetamine Sulfate Er] Other (See Comments)    agitation  . Oxycodone Nausea And Vomiting    Patient Measurements: TBW 107 kg  Vital Signs: Temp: 102.8 F (39.3 C) (01/09 1139) Temp Source: Rectal (01/09 1139) BP: 143/82 mmHg (01/09 1139) Pulse Rate: 76 (01/09 1139) Intake/Output from previous day:   Intake/Output from this shift:    Labs: No results for input(s): WBC, HGB, PLT, LABCREA, CREATININE in the last 72 hours. CrCl cannot be calculated (Unknown ideal weight.). No results for input(s): VANCOTROUGH, VANCOPEAK, VANCORANDOM, GENTTROUGH, GENTPEAK, GENTRANDOM, TOBRATROUGH, TOBRAPEAK, TOBRARND, AMIKACINPEAK, AMIKACINTROU, AMIKACIN in the last 72 hours.   Microbiology: No results found for this or any previous visit (from the past 720 hour(s)).  Medical History: Past Medical History  Diagnosis Date  . CAD (coronary artery disease)     s/p 4V CABG 10/15/08; EF is 50%  . Obesity   . BPH (benign prostatic hypertrophy)   . Edema   . Left ventricular systolic dysfunction     EF is 40% per echo December 2012  . AMI, INFERIOR WALL 09/30/2008    Qualifier: Diagnosis of  By: Unk Lightning, RN, BSN, Melanie    . CAD, ARTERY BYPASS GRAFT 11/18/2008    Qualifier: Diagnosis of  By: Angelena Form, MD, Harrell Gave    . Other specified forms of chronic ischemic heart disease 05/19/2009    Centricity Description: CARDIOMYOPATHY, ISCHEMIC Qualifier: Diagnosis of  By: Angelena Form, MD, Christopher   Centricity Description: OTHER SPEC FORMS CHRONIC ISCHEMIC HEART DISEASE Qualifier: Diagnosis of  By: Shelda Pal    . UNIVERSAL ULCERATIVE COLITIS 07/31/2007    Qualifier: Diagnosis of  By: [name] MD, [name]   . Ischemic cardiomyopathy 05/05/2011  . DM 09/01/2008    Qualifier: Diagnosis of  By: Burnett Kanaris    . OBSTRUCTIVE SLEEP APNEA 09/16/2008   Qualifier: Diagnosis of  By: [name] MD, [name]   . HYPERTENSION 09/01/2008    Qualifier: Diagnosis of  By: Burnett Kanaris    . Hyperlipidemia 05/18/2010  . Gout 09/23/2011  . Bipolar affective disorder ([place]) 09/23/2011  . TIA (transient ischemic attack) 09/23/2011  . Chronic pain 09/23/2011  . DNR (do not resuscitate) 09/23/2011    Per July 2011 [place] admission  . CVA (cerebral infarction) 09/23/2011    Small left occipital  . 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 History of alcohol abuse 09/23/2011  . Blood in stool   . Depression   . Urine incontinence   . Stroke [facility])     "mini strokes"  . Incontinence of urine   . Neuromuscular disorder ([place])     Diabetic neuropathy  . [facility] spotted fever   . Benign neoplasm of colon 03/2011    Adenomatous polyps  . 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 Lovena Le 12/12/2014    Assessment: 75 yo M presents on 1/9 from [facility] with fever, N/V, and AMS. Pharmacy to dose Zosyn and vancomycin for sepsis. Tmax is 102.8. WBC elevated at 12.1. Other labs drawn. Zosyn and vanc 1g ordered x 1 in the ED. SCr slightly elevated at 1.26, normalized CrCl ~52ml/min.  Goal of Therapy:  Vancomycin trough level 15-20 mcg/ml  Resolution of infection  Plan:  Give another vancomycin 1g to complete load dose Continue Zosyn 3.375 gm IV  q8h (4 hour infusion) Start vancomycin 1,250mg  IV Q24 tomorrow Monitor clinical picture, renal function, VT at Css F/U C&S, abx deescalation / LOT  Elenor Quinones, PharmD, BCPS Clinical Pharmacist Pager [phone] 01/26/2015 11:57 AM

## 2015-01-26 NOTE — ED Notes (Signed)
Pt refused in and out cath x 2; pt unable to void in urinal at present

## 2015-01-27 ENCOUNTER — Inpatient Hospital Stay (HOSPITAL_COMMUNITY): Payer: Medicare Other

## 2015-01-27 LAB — GLUCOSE, CAPILLARY
Glucose-Capillary: 310 mg/dL — ABNORMAL HIGH (ref 65–99)
Glucose-Capillary: 359 mg/dL — ABNORMAL HIGH (ref 65–99)
Glucose-Capillary: 313 mg/dL — ABNORMAL HIGH (ref 65–99)
Glucose-Capillary: 254 mg/dL — ABNORMAL HIGH (ref 65–99)

## 2015-01-27 LAB — CBC
HCT: 37.8 % — ABNORMAL LOW (ref 39.0–52.0)
Hemoglobin: 12.1 g/dL — ABNORMAL LOW (ref 13.0–17.0)
MCH: 30.5 pg (ref 26.0–34.0)
MCHC: 32 g/dL (ref 30.0–36.0)
MCV: 95.2 fL (ref 78.0–100.0)
PLATELETS: 127 10*3/uL — AB (ref 150–400)
RBC: 3.97 MIL/uL — ABNORMAL LOW (ref 4.22–5.81)
RDW: 14.1 % (ref 11.5–15.5)
WBC: 9 10*3/uL (ref 4.0–10.5)

## 2015-01-27 LAB — URINE CULTURE: Culture: NO GROWTH

## 2015-01-27 LAB — BASIC METABOLIC PANEL
Anion gap: 9 (ref 5–15)
BUN: 17 mg/dL (ref 6–20)
CALCIUM: 8 mg/dL — AB (ref 8.9–10.3)
CO2: 24 mmol/L (ref 22–32)
CREATININE: 1.17 mg/dL (ref 0.61–1.24)
Chloride: 106 mmol/L (ref 101–111)
GFR calc Af Amer: >60 mL/min (ref >60)
GFR, EST NON AFRICAN AMERICAN: 59 mL/min — AB (ref >60)
Glucose, Bld: 345 mg/dL — ABNORMAL HIGH (ref 65–99)
Potassium: 3.9 mmol/L (ref 3.5–5.1)
Sodium: 139 mmol/L (ref 135–145)

## 2015-01-27 LAB — LACTIC ACID, PLASMA: Lactic Acid, Venous: 1.4 mmol/L (ref 0.5–2.0)

## 2015-01-27 LAB — MRSA PCR SCREENING: MRSA by PCR: POSITIVE — AB

## 2015-01-27 MED ORDER — VANCOMYCIN HCL IN DEXTROSE 1-5 GM/200ML-% IV SOLN
1000.0000 mg | Freq: Two times a day (BID) | INTRAVENOUS | Status: DC
  Administered 2015-01-27: 1000 mg via INTRAVENOUS
  Filled 2015-01-27 (×3): qty 200

## 2015-01-27 MED ORDER — INSULIN GLARGINE 100 UNIT/ML ~~LOC~~ SOLN
35.0000 [IU] | Freq: Two times a day (BID) | SUBCUTANEOUS | Status: DC
  Administered 2015-01-27 – 2015-01-28 (×2): 35 [IU] via SUBCUTANEOUS
  Filled 2015-01-27 (×3): qty 0.35

## 2015-01-27 MED ORDER — MUPIROCIN 2 % EX OINT
1.0000 "application " | TOPICAL_OINTMENT | Freq: Two times a day (BID) | CUTANEOUS | Status: DC
  Administered 2015-01-27 – 2015-01-31 (×9): 1 via NASAL
  Filled 2015-01-27 (×3): qty 22

## 2015-01-27 MED ORDER — CHLORHEXIDINE GLUCONATE CLOTH 2 % EX PADS
6.0000 | MEDICATED_PAD | Freq: Every day | CUTANEOUS | Status: AC
  Administered 2015-01-27 – 2015-01-31 (×5): 6 via TOPICAL

## 2015-01-27 NOTE — Progress Notes (Addendum)
[facility] PROGRESS NOTE  Ulis Streets [name] [id number] DOB: 18-Feb-1940 DOA: 01/26/2015 PCP: Cathlean Cower, MD  Assessment/Plan: 1. Sepsis/? Possibly HCAP vs Aspiration pneumonia -CXR with mild basilar atx, admitted with fever/hypoxia, wife reports cough and congestion for 1 week -UA/CT Abd/FLu PCR negative -continue current Abx, lactic acidosis resolved, cut down IVF -de-escalate Abx based on cultures tomorrow -continue IVF, cut down rate to 75cc/hr -Speech eval  2. Dementia -stable at baseline -lamictal, klonopin PRN  3. Heel pressure ulcer -continue wound care, no s/s of surrounding infection  4. AKi -resolved  5. DM -CBGs in 300s, will go back to full home dose of lantus, 35units BID  6. Chronic pain/neuropathy -continue gabapentin/oxycodone  7. H/o CVA/CAD -continue ASA  DVt proph: Hep SQ  Code Status: DNR Family Communication: none at bedside, called and d/w wife [name] Disposition Plan: Tx to floor      HPI/Subjective: No events overnight  Objective: Filed Vitals:   01/27/15 0300 01/27/15 0400  BP: 115/59 135/66  Pulse: 84 91  Temp:  99.1 F (37.3 C)  Resp: 21 26    Intake/Output Summary (Last 24 hours) at 01/27/15 0751 Last data filed at 01/27/15 0400  Gross per 24 hour  Intake 6006.67 ml  Output      0 ml  Net 6006.67 ml   Filed Weights   01/26/15 1214 01/26/15 2030  Weight: 106.595 kg (235 lb) 108.6 kg (239 lb 6.7 oz)    Exam:   General:  Somnolent, arousable, answers questions, oriented to self, place only  Cardiovascular: S1S2/RRR  Respiratory: CTAB  Abdomen: soft, NT, BS present  Musculoskeletal:no edema , small pressure sores noted  Data Reviewed: Basic Metabolic Panel:  Recent Labs Lab 01/26/15 1200 01/27/15 0227  NA 140 139  K 3.8 3.9  CL 103 106  CO2 23 24  GLUCOSE 414* 345*  BUN 16 17  CREATININE 1.26* 1.17  CALCIUM 9.0 8.0*   Liver Function Tests:  Recent Labs Lab 01/26/15 1200  AST 26  ALT 33   ALKPHOS 44  BILITOT 1.0  PROT 6.3*  ALBUMIN 3.1*    Recent Labs Lab 01/26/15 1200  LIPASE 19   No results for input(s): AMMONIA in the last 168 hours. CBC:  Recent Labs Lab 01/26/15 1200 01/27/15 0227  WBC 12.1* 9.0  NEUTROABS 10.5*  --   HGB 15.0 12.1*  HCT 44.8 37.8*  MCV 92.9 95.2  PLT 142* 127*   Cardiac Enzymes: No results for input(s): CKTOTAL, CKMB, CKMBINDEX, TROPONINI in the last 168 hours. BNP (last 3 results) No results for input(s): BNP in the last 8760 hours.  ProBNP (last 3 results) No results for input(s): PROBNP in the last 8760 hours.  CBG:  Recent Labs Lab 01/26/15 1803 01/26/15 2028 01/26/15 2223  GLUCAP 435* 402* 332*    Recent Results (from the past 240 hour(s))  Blood Culture (routine x 2)     Status: None (Preliminary result)   Collection Time: 01/26/15 11:16 AM  Result Value Ref Range Status   Specimen Description BLOOD LEFT HAND  Final   Special Requests BOTTLES DRAWN AEROBIC AND ANAEROBIC 4CC  Final   Culture  Setup Time   Final    GRAM POSITIVE COCCI IN CLUSTERS IN BOTH AEROBIC AND ANAEROBIC BOTTLES CRITICAL RESULT CALLED TO, READ BACK BY AND VERIFIED WITH: T NEILSON @ [id number] 01/27/15 MKELLYM    Culture CULTURE REINCUBATED FOR BETTER GROWTH  Final   Report Status PENDING  Incomplete  Blood Culture (  routine x 2)     Status: None (Preliminary result)   Collection Time: 01/26/15 11:55 AM  Result Value Ref Range Status   Specimen Description BLOOD LEFT ANTECUBITAL  Final   Special Requests BOTTLES DRAWN AEROBIC AND ANAEROBIC 5CC  Final   Culture  Setup Time   Final    GRAM POSITIVE COCCI IN CLUSTERS IN BOTH AEROBIC AND ANAEROBIC BOTTLES CRITICAL RESULT CALLED TO, READ BACK BY AND VERIFIED WITH: Candiss Norse [id number] 01/27/15 MKELLY    Culture CULTURE REINCUBATED FOR BETTER GROWTH  Final   Report Status PENDING  Incomplete  MRSA PCR Screening     Status: Abnormal   Collection Time: 01/26/15 10:20 PM  Result Value Ref Range Status    MRSA by PCR POSITIVE (A) NEGATIVE Final    Comment:        The GeneXpert MRSA Assay (FDA approved for NASAL specimens only), is one component of a comprehensive MRSA colonization surveillance program. It is not intended to diagnose MRSA infection nor to guide or monitor treatment for MRSA infections. RESULT CALLED TO, READ BACK BY AND VERIFIED WITH: T WILSON,RN @0134  01/27/15 MKELLY      Studies: Dg Abd 1 View  01/26/2015  CLINICAL DATA:  Fever, nausea, vomiting, altered mental status EXAM: ABDOMEN - 1 VIEW COMPARISON:  CT abdomen pelvis 01/26/2015, abdominal radiograph 10/07/2012 FINDINGS: Bones demineralized. Excreted contrast material within the renal collecting systems bilaterally, RIGHT ureter, and urinary bladder. RIGHT ureter appears mild dilated. Nonobstructive bowel gas pattern with scattered stool throughout colon. No evidence of bowel obstruction or wall thickening. IMPRESSION: Question mild dilatation of the RIGHT ureter though no obstructing stone is identified by prior CT, question due to mild distention of urinary bladder. Nonobstructive bowel gas pattern. Electronically Signed   By: Lavonia Dana M.D.   On: 01/26/2015 17:30   Ct Head Wo Contrast  01/26/2015  CLINICAL DATA:  75 year old with altered mental status. EXAM: CT HEAD WITHOUT CONTRAST TECHNIQUE: Contiguous axial images were obtained from the base of the skull through the vertex without intravenous contrast. COMPARISON:  Head CT 10/07/2012 and 07/22/2012. FINDINGS: There is no evidence of acute intracranial hemorrhage, mass lesion, brain edema or extra-axial fluid collection. Mild generalized atrophy appears unchanged. There is patchy and confluent low-density in the periventricular white matter which appears mildly progressive. 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 Subcortical low-density posteriorly in the right frontal lobe appears progressive (image 24). No evidence of cortical based infarct.  Diffuse intracranial vascular calcifications are noted. The visualized paranasal sinuses, mastoid air cells and middle ears are clear. The calvarium is intact. IMPRESSION: Mildly progressive chronic small vessel ischemic changes compared with prior CT from 2014. No acute intracranial findings. Electronically Signed   By: Richardean Sale M.D.   On: 01/26/2015 16:07   Ct Abdomen Pelvis W Contrast  01/26/2015  CLINICAL DATA:  Fever with nausea and vomiting. Altered mental status. History of BPH and atherosclerosis. EXAM: CT ABDOMEN AND PELVIS WITH CONTRAST TECHNIQUE: Multidetector CT imaging of the abdomen and pelvis was performed using the standard protocol following bolus administration of intravenous contrast. CONTRAST:  154mL OMNIPAQUE IOHEXOL 300 MG/ML  SOLN COMPARISON:  CT 01/18/2012. FINDINGS: Lower chest: Atelectasis at both lung bases is similar to the prior examination. There is mild cardiomegaly status post median sternotomy and CABG. Cardiac pacemaker noted. There is no pleural or pericardial effusion. Hepatobiliary: The hepatic density is diffusely decreased, consistent with steatosis. No focal abnormality observed. No evidence of gallstones, gallbladder wall  thickening or biliary dilatation. Pancreas: Unremarkable. No pancreatic ductal dilatation or surrounding inflammatory changes. Spleen: Normal in size without focal abnormality. Adrenals/Urinary Tract: Both adrenal glands appear normal. There are low-density renal cysts bilaterally. No suspicious renal findings are demonstrated. There is high density within the renal collecting systems bilaterally on the early images, appearing to reflect early contrast excretion. Renal calculi are difficult to exclude. There is no evidence of ureteral or bladder calculus. There is no hydronephrosis. The bladder appears stable with mild chronic wall thickening. Stomach/Bowel: No evidence of bowel wall thickening, distention or surrounding inflammatory change. There  is mild distal colonic diverticulosis. The appendix appears normal. Vascular/Lymphatic: There are no enlarged abdominal or pelvic lymph nodes. There is diffuse atherosclerosis of the aorta, its branches and the iliac arteries. Reproductive: The prostate gland appears unchanged. Other: No evidence of abdominal wall mass or hernia. Musculoskeletal: No acute or significant osseous findings. IMPRESSION: 1. No acute findings or explanation for the patient's symptoms. No evidence of bowel obstruction or abscess. 2. Hepatic steatosis. 3. Extensive atherosclerosis. 4. Chronic bibasilar atelectasis and cardiomegaly. 5. Grossly stable bilateral renal cysts. Renal calculi difficult to exclude. No evidence of ureteral calculus or hydronephrosis. Electronically Signed   By: Richardean Sale M.D.   On: 01/26/2015 16:46   Dg Chest Port 1 View  01/27/2015  CLINICAL DATA:  Sepsis, acute respiratory failure, history of previous CVA. EXAM: PORTABLE CHEST 1 VIEW COMPARISON:  Portable chest x-ray of October 27, 2015 FINDINGS: Patient is is mildly rotated to the left on today's study. The lungs are adequately inflated. There is persistent increased density at the left lung base with partial obscuration of the hemidiaphragm. The cardiac silhouette remains enlarged. The pulmonary vascularity is not engorged. The permanent pacemaker is in reasonable position. There are 7 intact sternal wires from previous CABG. IMPRESSION: Stable appearance of the chest since yesterday's study. There is mild bibasilar subsegmental atelectasis greatest on the left. Electronically Signed   By: David  Martinique M.D.   On: 01/27/2015 07:19   Dg Chest Port 1 View  01/26/2015  CLINICAL DATA:  Fever EXAM: PORTABLE CHEST 1 VIEW COMPARISON:  12/13/2014 FINDINGS: Cardiomegaly again noted. Study is limited by patient's large body habitus. Status post CABG. Dual lead cardiac pacemaker is unchanged in position. No pulmonary edema. Bilateral basilar atelectasis. No  segmental infiltrates. Lung bases are partially obscured by patient's upper abdomen. IMPRESSION: Limited exam by patient's large body habitus. No gross infiltrate or pulmonary edema. Bilateral basilar atelectasis. Dual lead cardiac pacemaker is unchanged in position. Electronically Signed   By: Lahoma Crocker M.D.   On: 01/26/2015 11:39    Scheduled Meds: . antiseptic oral rinse  7 mL Mouth Rinse BID  . aspirin  81 mg Oral QHS  . Chlorhexidine Gluconate Cloth  6 each Topical Q0600  . clonazePAM  0.5 mg Oral QHS  . doxazosin  1 mg Oral QHS  . enoxaparin (LOVENOX) injection  40 mg Subcutaneous Q24H  . gabapentin  100 mg Oral QHS  . insulin aspart  0-15 Units Subcutaneous TID WC  . insulin aspart  0-5 Units Subcutaneous QHS  . insulin glargine  18 Units Subcutaneous BID  . lamoTRIgine  100 mg Oral Daily  . mupirocin ointment  1 application Nasal BID  . oxyCODONE  5 mg Oral Q6H  . piperacillin-tazobactam (ZOSYN)  IV  3.375 g Intravenous 3 times per day  . sodium chloride  3 mL Intravenous Q12H  . traZODone  50 mg Oral QHS  .  vancomycin  1,250 mg Intravenous Q24H  . vancomycin  1,000 mg Intravenous Once   Continuous Infusions: . sodium chloride 100 mL/hr at 01/27/15 0533   Antibiotics Given (last 72 hours)    Date/Time Action Medication Dose Rate   01/27/15 0121 Given   piperacillin-tazobactam (ZOSYN) IVPB 3.375 g 3.375 g 12.5 mL/hr      Active Problems:   DM (diabetes mellitus), type 2 with peripheral vascular complications (HCC)   CAD, ARTERY BYPASS GRAFT   BPH (benign prostatic hypertrophy)   Bipolar affective disorder (HCC)   CVA (cerebral infarction)   Fever   Sepsis ([place])   Acute respiratory failure ([place])   AKI (acute kidney injury) ([place])   Acute encephalopathy    Time spent: 54min    [name]  [facility] Pager [phone]. If 7PM-7AM, please contact night-coverage at [email or web address], password [facility] 01/27/2015, 7:51 AM  LOS: 1 day

## 2015-01-27 NOTE — Progress Notes (Signed)
ANTIBIOTIC CONSULT NOTE -follow up Pharmacy Consult for Zosyn and vancomyicn Indication: sepsis  Allergies  Allergen Reactions  . Dexedrine [Dextroamphetamine Sulfate Er] Other (See Comments)    agitation  . Oxycodone Nausea And Vomiting    Patient Measurements: TBW 107 kg  Vital Signs: Temp: 100.3 F (37.9 C) (01/10 1148) Temp Source: Oral (01/10 1148) BP: 132/71 mmHg (01/10 1148) Pulse Rate: 97 (01/10 1148) Intake/Output from previous day: 01/09 0701 - 01/10 0700 In: 6106.7 [P.O.:480; I.V.:2076.7; IV Piggyback:3550] Out: -  Intake/Output from this shift: Total I/O In: 100 [I.V.:100] Out: -   Labs:  Recent Labs  01/26/15 1200 01/27/15 0227  WBC 12.1* 9.0  HGB 15.0 12.1*  PLT 142* 127*  CREATININE 1.26* 1.17   Estimated Creatinine Clearance: 64.2 mL/min (by C-G formula based on Cr of 1.17). No results for input(s): VANCOTROUGH, VANCOPEAK, VANCORANDOM, GENTTROUGH, GENTPEAK, GENTRANDOM, TOBRATROUGH, TOBRAPEAK, TOBRARND, AMIKACINPEAK, AMIKACINTROU, AMIKACIN in the last 72 hours.   Microbiology: Recent Results (from the past 720 hour(s))  Blood Culture (routine x 2)     Status: None (Preliminary result)   Collection Time: 01/26/15 11:16 AM  Result Value Ref Range Status   Specimen Description BLOOD LEFT HAND  Final   Special Requests BOTTLES DRAWN AEROBIC AND ANAEROBIC 4CC  Final   Culture  Setup Time   Final    GRAM POSITIVE COCCI IN CLUSTERS IN BOTH AEROBIC AND ANAEROBIC BOTTLES CRITICAL RESULT CALLED TO, READ BACK BY AND VERIFIED WITH: T NEILSON @ 0332 01/27/15 MKELLYM    Culture CULTURE REINCUBATED FOR BETTER GROWTH  Final   Report Status PENDING  Incomplete  Blood Culture (routine x 2)     Status: None (Preliminary result)   Collection Time: 01/26/15 11:55 AM  Result Value Ref Range Status   Specimen Description BLOOD LEFT ANTECUBITAL  Final   Special Requests BOTTLES DRAWN AEROBIC AND ANAEROBIC 5CC  Final   Culture  Setup Time   Final    GRAM POSITIVE  COCCI IN CLUSTERS IN BOTH AEROBIC AND ANAEROBIC BOTTLES CRITICAL RESULT CALLED TO, READ BACK BY AND VERIFIED WITH: Candiss Norse [id number] 01/27/15 MKELLY    Culture CULTURE REINCUBATED FOR BETTER GROWTH  Final   Report Status PENDING  Incomplete  Urine culture     Status: None (Preliminary result)   Collection Time: 01/26/15  2:47 PM  Result Value Ref Range Status   Specimen Description URINE, RANDOM  Final   Special Requests NONE  Final   Culture NO GROWTH < 24 HOURS  Final   Report Status PENDING  Incomplete  MRSA PCR Screening     Status: Abnormal   Collection Time: 01/26/15 10:20 PM  Result Value Ref Range Status   MRSA by PCR POSITIVE (A) NEGATIVE Final    Comment:        The GeneXpert MRSA Assay (FDA approved for NASAL specimens only), is one component of a comprehensive MRSA colonization surveillance program. It is not intended to diagnose MRSA infection nor to guide or monitor treatment for MRSA infections. RESULT CALLED TO, READ BACK BY AND VERIFIED WITH: Mariana Single @0134  01/27/15 MKELLY      Assessment: 75 yo M presented on 1/9 from [facility] with fever, N/V, and AMS. Pharmacy to dose Zosyn and vancomycin for sepsis.  WBC 12.1>9, creat 1.26>1.17, normalized creat cl 55; t max 100.3;  He got vancomycin 1 gm 1/9 at 1234, he did not get the 2nd dose of vancomycin 1 gm as ordered to completed 2 gm loading  dose.   2/2 BC + for GPC in clusters - prob staph bacteremia.   Vanc 1/9>> Zosyn 1/9>>  1/9 MRSA PCR + Flu neg 1/9 Ucx>> 1/9 BC x2>> 2/2 gm positive cocci in clusters    Goal of Therapy:  Vancomycin trough level 15-20 mcg/ml  Resolution of infection  Plan:  Give vancomycin 1250 mg IV x 1 dose now, then vancomycin 1 gm IV q12h Continue zosyn 3.375 q8h but this can probably stopped F/u BC sensitivities, renal fxn Levels as needed  Eudelia Bunch, Pharm.D. BP:[id number] 01/27/2015 1:45 PM

## 2015-01-27 NOTE — Evaluation (Signed)
Clinical/Bedside Swallow Evaluation Patient Details  Name: [name] MRN: [id number] Date of Birth: 1940-07-06  Today's Date: 01/27/2015 Time: SLP Start Time (ACUTE ONLY): 0846 SLP Stop Time (ACUTE ONLY): 0916 SLP Time Calculation (min) (ACUTE ONLY): 30 min  Past Medical History:  Past Medical History  Diagnosis Date  . CAD (coronary artery disease)     s/p 4V CABG 10/15/08; EF is 50%  . Obesity   . BPH (benign prostatic hypertrophy)   . Edema   . Left ventricular systolic dysfunction     EF is 40% per echo December 2012  . AMI, INFERIOR WALL 09/30/2008    Qualifier: Diagnosis of  By: Unk Lightning, RN, BSN, Melanie    . CAD, ARTERY BYPASS GRAFT 11/18/2008    Qualifier: Diagnosis of  By: Angelena Form, MD, Harrell Gave    . Other specified forms of chronic ischemic heart disease 05/19/2009    Centricity Description: CARDIOMYOPATHY, ISCHEMIC Qualifier: Diagnosis of  By: Angelena Form, MD, Christopher   Centricity Description: OTHER SPEC FORMS CHRONIC ISCHEMIC HEART DISEASE Qualifier: Diagnosis of  By: Shelda Pal    . UNIVERSAL ULCERATIVE COLITIS 07/31/2007    Qualifier: Diagnosis of  By: [name] MD, [name]   . Ischemic cardiomyopathy 05/05/2011  . DM 09/01/2008    Qualifier: Diagnosis of  By: Burnett Kanaris    . OBSTRUCTIVE SLEEP APNEA 09/16/2008    Qualifier: Diagnosis of  By: [name] MD, [name]   . HYPERTENSION 09/01/2008    Qualifier: Diagnosis of  By: Burnett Kanaris    . Hyperlipidemia 05/18/2010  . Gout 09/23/2011  . Bipolar affective disorder ([place]) 09/23/2011  . TIA (transient ischemic attack) 09/23/2011  . Chronic pain 09/23/2011  . DNR (do not resuscitate) 09/23/2011    Per July 2011 [place] admission  . CVA (cerebral infarction) 09/23/2011    Small left occipital  . 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 History of alcohol abuse 09/23/2011  . Blood in stool   . Depression   . Urine incontinence   . Stroke [facility])     "mini strokes"  . Incontinence of urine   . Neuromuscular disorder ([place])      Diabetic neuropathy  . [facility] spotted fever   . Benign neoplasm of colon 03/2011    Adenomatous polyps  . 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 Lovena Le 12/12/2014   Past Surgical History:  Past Surgical History  Procedure Laterality Date  . Coronary artery bypass graft      Median sternotomy, extracorporeal circulation,  coronary artery bypass graft surgery x4 using a sequential left internal   mammary artery graft to the mid and distal left anterior descending, a   saphenous vein graft to diagonal branch of the left anterior descending,   and a saphenous vein graft to the obtuse marginal branch of left   circumflex coronary artery.    . Cardiac catheterization      Triple-vessel coronary artery disease. Low-normal left ventricular systolic function with mild   anteroapical wall motion abnormality.   . Tonsilectomy, adenoidectomy, bilateral myringotomy and tubes  1946  . Multiple extractions with alveoloplasty  12/01/2011    Procedure: MULTIPLE EXTRACION WITH ALVEOLOPLASTY;  Surgeon: Lenn Cal, DDS;  Location: [place];  Service: Oral Surgery;  Laterality: N/A;  Extraction of tooth #'s 3,4,5,6,7,8,9,10,11,12,13,14,15,16,17,20,21,22,23,24,25,26,27,28,29,30,31,32 with alveoloplasty and bilateral mandibular lingual tori  . Flexible sigmoidoscopy  01/17/2012    Procedure: FLEXIBLE SIGMOIDOSCOPY;  Surgeon: Lafayette Dragon, MD;  Location: [facility];  Service: Endoscopy;  Laterality: N/A;  . Ep implantable device N/A 12/12/2014    Procedure: Pacemaker Implant;  Surgeon: [name], MD;  Location: [place] [facility];  Service: Cardiovascular;  Laterality: N/A;   HPI:  [name] is a 76 y.o. male who lives in assisted living admitted with altered mental state, diaphoretic and baseline dementia. Per note pt had 2 episodes of nonbloody nonbilious emesis with aspiration of vomit was noted by EMS. PMH: DM, CVA (2013), CABG.  CXR 1/9 no gross infiltrate or pulmonary edema. Bilateral basilar atelectasis. CT no acute intracranial findings. Found to be septic of unknown source, suspected aspiration and UTI   Assessment / Plan / Recommendation Clinical Impression  Pt required max verbal and tactile stimulation due to lethargy at initial portion of assessment. Maintained awake state keeping eyes closed. Pt is endentulous, however does not use dentures when eating. Prolonged oral mastication and transit with solid texture. No indications of aspiration with cup or straw sips thin or solid. Suspect pharyngeal residue marked by multiple swallows and pt report of globus sensation. SLP educated pt re: swallow precautions (2 swallows, small sips). Will need continued education due to lethargy today. Recommend Dys 3 texture and thin liquids, straws allowed, pills whole in applesauce and full assist and supervision with meals. ST to continue treatment.      Aspiration Risk  Moderate aspiration risk    Diet Recommendation Dysphagia 3 (Mech soft);Thin liquid   Liquid Administration via: Cup;Straw Medication Administration: Whole meds with puree Supervision: Staff to assist with self feeding;Full supervision/cueing for compensatory strategies (have pt initiate feeding with assist if able) Compensations: Slow rate;Small sips/bites;Lingual sweep for clearance of pocketing Postural Changes: Seated upright at 90 degrees    Other  Recommendations Oral Care Recommendations: Oral care QID   Follow up Recommendations   (TBD)    Frequency and Duration min 2x/week  2 weeks       Prognosis Prognosis for Safe Diet Advancement: Good      Swallow Study   General Date of Onset: 01/26/15 HPI: [name] is a 75 y.o. male who lives in assisted living admitted with altered mental state, diaphoretic and baseline dementia. Per note pt had 2 episodes of nonbloody nonbilious emesis with aspiration of vomit was noted by EMS. PMH: DM, CVA (2013),  CABG. CXR 1/9 no gross infiltrate or pulmonary edema. Bilateral basilar atelectasis. CT no acute intracranial findings. Found to be septic of unknown source, suspected aspiration and UTI Type of Study: Bedside Swallow Evaluation Previous Swallow Assessment:  (none found) Diet Prior to this Study: Regular;Thin liquids Temperature Spikes Noted: Yes Respiratory Status: Nasal cannula History of Recent Intubation: No Behavior/Cognition: Lethargic/Drowsy;Cooperative;Requires cueing Oral Cavity Assessment: Other (comment) (appears to have thrush) Oral Care Completed by SLP: Yes Oral Cavity - Dentition: Edentulous;Dentures, not available (pt does not wear when eating) Vision: Functional for self-feeding Self-Feeding Abilities: Needs assist;Needs set up Patient Positioning: Upright in bed Baseline Vocal Quality: Normal Volitional Cough: Strong Volitional Swallow: Able to elicit    Oral/Motor/Sensory Function Overall Oral Motor/Sensory Function: Generalized oral weakness (due to lethargy) Facial ROM: Within Functional Limits Facial Symmetry: Within Functional Limits Facial Strength:  (generalized weakness) Lingual ROM:  (generalized weakness/lethargic) Lingual Symmetry: Within Functional Limits   Ice Chips Ice chips: Not tested   Thin Liquid Thin Liquid: Impaired Presentation: Cup;Straw Oral Phase Impairments: Reduced lingual movement/coordination Oral Phase Functional Implications: Oral holding (hesitant) Pharyngeal  Phase Impairments: Multiple  swallows    Nectar Thick Nectar Thick Liquid: Not tested   Honey Thick Honey Thick Liquid: Not tested   Puree Puree: Impaired Presentation: Spoon Pharyngeal Phase Impairments: Multiple swallows   Solid   GO    Solid: Impaired Oral Phase Impairments: Reduced lingual movement/coordination;Impaired mastication Oral Phase Functional Implications: Prolonged oral transit;Impaired mastication Pharyngeal Phase Impairments: Multiple swallows        Houston Siren 01/27/2015,9:40 AM   Orbie Pyo Colvin Caroli.Ed [employer] [phone]

## 2015-01-27 NOTE — Progress Notes (Signed)
Utilization Review Completed.  

## 2015-01-28 ENCOUNTER — Encounter (HOSPITAL_COMMUNITY): Payer: Self-pay | Admitting: Nurse Practitioner

## 2015-01-28 ENCOUNTER — Encounter (HOSPITAL_COMMUNITY)
Admission: EM | Disposition: A | Discharge status: Skilled Nursing Facility | Payer: Self-pay | Source: Home / Self Care | Attending: Internal Medicine

## 2015-01-28 DIAGNOSIS — T827XXA Infection and inflammatory reaction due to other cardiac and vascular devices, implants and grafts, initial encounter: Principal | ICD-10-CM

## 2015-01-28 DIAGNOSIS — T827XXD Infection and inflammatory reaction due to other cardiac and vascular devices, implants and grafts, subsequent encounter: Secondary | ICD-10-CM

## 2015-01-28 DIAGNOSIS — B9561 Methicillin susceptible Staphylococcus aureus infection as the cause of diseases classified elsewhere: Secondary | ICD-10-CM

## 2015-01-28 DIAGNOSIS — A4101 Sepsis due to Methicillin susceptible Staphylococcus aureus: Secondary | ICD-10-CM

## 2015-01-28 HISTORY — PX: CARDIAC CATHETERIZATION: SHX172

## 2015-01-28 HISTORY — PX: EP IMPLANTABLE DEVICE: SHX172B

## 2015-01-28 LAB — BASIC METABOLIC PANEL
Anion gap: 9 (ref 5–15)
BUN: 11 mg/dL (ref 6–20)
CALCIUM: 8.1 mg/dL — AB (ref 8.9–10.3)
CO2: 25 mmol/L (ref 22–32)
CREATININE: 1.05 mg/dL (ref 0.61–1.24)
Chloride: 106 mmol/L (ref 101–111)
Glucose, Bld: 253 mg/dL — ABNORMAL HIGH (ref 65–99)
Potassium: 3.6 mmol/L (ref 3.5–5.1)
SODIUM: 140 mmol/L (ref 135–145)

## 2015-01-28 LAB — GLUCOSE, CAPILLARY
GLUCOSE-CAPILLARY: 252 mg/dL — AB (ref 65–99)
Glucose-Capillary: 260 mg/dL — ABNORMAL HIGH (ref 65–99)
Glucose-Capillary: 191 mg/dL — ABNORMAL HIGH (ref 65–99)
Glucose-Capillary: 238 mg/dL — ABNORMAL HIGH (ref 65–99)

## 2015-01-28 LAB — CBC
HCT: 37.2 % — ABNORMAL LOW (ref 39.0–52.0)
Hemoglobin: 12.1 g/dL — ABNORMAL LOW (ref 13.0–17.0)
MCH: 30.6 pg (ref 26.0–34.0)
MCHC: 32.5 g/dL (ref 30.0–36.0)
MCV: 93.9 fL (ref 78.0–100.0)
PLATELETS: 120 10*3/uL — AB (ref 150–400)
RBC: 3.96 MIL/uL — AB (ref 4.22–5.81)
RDW: 13.7 % (ref 11.5–15.5)
WBC: 5.3 10*3/uL (ref 4.0–10.5)

## 2015-01-28 SURGERY — PPM/BIV PPM GENERATOR REMOVAL

## 2015-01-28 MED ORDER — ONDANSETRON HCL 4 MG/2ML IJ SOLN: 4.0000 mg | Freq: Four times a day (QID) | INTRAMUSCULAR | Status: DC | PRN

## 2015-01-28 MED ORDER — HEPARIN (PORCINE) IN NACL 2-0.9 UNIT/ML-% IJ SOLN
INTRAMUSCULAR | Status: DC | PRN
  Administered 2015-01-28: 17:00:00

## 2015-01-28 MED ORDER — ACETAMINOPHEN 325 MG PO TABS: 325.0000 mg | ORAL_TABLET | ORAL | Status: DC | PRN

## 2015-01-28 MED ORDER — VITAMIN D (ERGOCALCIFEROL) 1.25 MG (50000 UNIT) PO CAPS
50000.0000 [IU] | ORAL_CAPSULE | ORAL | Status: DC
  Administered 2015-01-30: 50000 [IU] via ORAL
  Filled 2015-01-28: qty 1

## 2015-01-28 MED ORDER — CHLORHEXIDINE GLUCONATE 4 % EX LIQD
60.0000 mL | Freq: Once | CUTANEOUS | Status: DC
  Filled 2015-01-28: qty 60

## 2015-01-28 MED ORDER — HEPARIN SODIUM (PORCINE) 5000 UNIT/ML IJ SOLN
5000.0000 [IU] | Freq: Three times a day (TID) | INTRAMUSCULAR | Status: DC
  Administered 2015-01-28 – 2015-02-02 (×15): 5000 [IU] via SUBCUTANEOUS
  Filled 2015-01-28 (×15): qty 1

## 2015-01-28 MED ORDER — INSULIN GLARGINE 100 UNIT/ML ~~LOC~~ SOLN
40.0000 [IU] | Freq: Two times a day (BID) | SUBCUTANEOUS | Status: DC
  Administered 2015-01-28 – 2015-02-02 (×10): 40 [IU] via SUBCUTANEOUS
  Filled 2015-01-28 (×12): qty 0.4

## 2015-01-28 MED ORDER — SODIUM CHLORIDE 0.9 % IR SOLN
80.0000 mg | Status: AC
  Administered 2015-01-28: 80 mg
  Filled 2015-01-28: qty 2

## 2015-01-28 MED ORDER — OXYCODONE HCL 5 MG PO TABS: 5.0000 mg | ORAL_TABLET | Freq: Two times a day (BID) | ORAL | Status: DC | PRN

## 2015-01-28 MED ORDER — LIDOCAINE HCL (PF) 1 % IJ SOLN
INTRAMUSCULAR | Status: DC | PRN
  Administered 2015-01-28: 31 mL via INTRADERMAL

## 2015-01-28 MED ORDER — LOPERAMIDE HCL 2 MG PO CAPS: 4.0000 mg | ORAL_CAPSULE | ORAL | Status: DC | PRN

## 2015-01-28 MED ORDER — INSULIN LISPRO 100 UNIT/ML (KWIKPEN): 4.0000 [IU] | PEN_INJECTOR | Freq: Four times a day (QID) | SUBCUTANEOUS | Status: DC

## 2015-01-28 MED ORDER — FENTANYL CITRATE (PF) 100 MCG/2ML IJ SOLN
INTRAMUSCULAR | Status: AC
  Filled 2015-01-28: qty 2

## 2015-01-28 MED ORDER — VANCOMYCIN HCL IN DEXTROSE 1-5 GM/200ML-% IV SOLN
1000.0000 mg | Freq: Two times a day (BID) | INTRAVENOUS | Status: DC
  Administered 2015-01-29 – 2015-01-30 (×4): 1000 mg via INTRAVENOUS
  Filled 2015-01-28 (×5): qty 200

## 2015-01-28 MED ORDER — GUAIFENESIN 100 MG/5ML PO SYRP
100.0000 mg | ORAL_SOLUTION | Freq: Four times a day (QID) | ORAL | Status: DC | PRN
  Filled 2015-01-28: qty 5

## 2015-01-28 MED ORDER — NITROGLYCERIN 0.4 MG SL SUBL: 0.4000 mg | SUBLINGUAL_TABLET | SUBLINGUAL | Status: DC | PRN

## 2015-01-28 MED ORDER — ACETAMINOPHEN 325 MG PO TABS: 650.0000 mg | ORAL_TABLET | ORAL | Status: DC | PRN

## 2015-01-28 MED ORDER — SODIUM CHLORIDE 0.9 % IR SOLN
Status: AC
  Filled 2015-01-28: qty 2

## 2015-01-28 MED ORDER — HEPARIN (PORCINE) IN NACL 2-0.9 UNIT/ML-% IJ SOLN
INTRAMUSCULAR | Status: AC
  Filled 2015-01-28: qty 500

## 2015-01-28 MED ORDER — MIDAZOLAM HCL 5 MG/5ML IJ SOLN
INTRAMUSCULAR | Status: AC
  Filled 2015-01-28: qty 5

## 2015-01-28 MED ORDER — FENTANYL CITRATE (PF) 100 MCG/2ML IJ SOLN
INTRAMUSCULAR | Status: DC | PRN
  Administered 2015-01-28 (×4): 12.5 ug via INTRAVENOUS

## 2015-01-28 MED ORDER — MIDAZOLAM HCL 5 MG/5ML IJ SOLN
INTRAMUSCULAR | Status: DC | PRN
  Administered 2015-01-28 (×5): 1 mg via INTRAVENOUS

## 2015-01-28 MED ORDER — CHLORHEXIDINE GLUCONATE 4 % EX LIQD
60.0000 mL | Freq: Once | CUTANEOUS | Status: AC
  Administered 2015-01-28: 4 via TOPICAL
  Filled 2015-01-28: qty 60

## 2015-01-28 MED ORDER — CEFAZOLIN SODIUM-DEXTROSE 2-3 GM-% IV SOLR
2.0000 g | Freq: Three times a day (TID) | INTRAVENOUS | Status: DC
  Administered 2015-01-29: 2 g via INTRAVENOUS
  Filled 2015-01-28 (×7): qty 50

## 2015-01-28 MED ORDER — LIDOCAINE HCL (PF) 1 % IJ SOLN
INTRAMUSCULAR | Status: AC
  Filled 2015-01-28: qty 60

## 2015-01-28 MED ORDER — VANCOMYCIN HCL 10 G IV SOLR
1500.0000 mg | INTRAVENOUS | Status: AC
  Administered 2015-01-28: 1500 mg via INTRAVENOUS
  Filled 2015-01-28 (×2): qty 1500

## 2015-01-28 MED ORDER — VANCOMYCIN HCL 10 G IV SOLR: 1500.0000 mg | Freq: Two times a day (BID) | INTRAVENOUS | Status: DC

## 2015-01-28 SURGICAL SUPPLY — 6 items
CABLE SURGICAL S-101-97-12 (CABLE) ×3 IMPLANT
HOVERMATT SINGLE USE (MISCELLANEOUS) ×3 IMPLANT
LEAD TENDRIL SDX 2088TC-58CM (Lead) ×3 IMPLANT
PAD DEFIB LIFELINK (PAD) ×3 IMPLANT
SHEATH CLASSIC 7F (SHEATH) ×3 IMPLANT
TRAY PACEMAKER INSERTION (PACKS) ×3 IMPLANT

## 2015-01-28 NOTE — Evaluation (Signed)
Physical Therapy Evaluation Patient Details Name: [name] MRN: [id number] DOB: 05/19/1940 Today's Date: 01/28/2015   History of Present Illness  [place] is a 75 y.o. male who lives in assisted living admitted with altered mental state, diaphoretic and baseline dementia. Per note pt had 2 episodes of nonbloody nonbilious emesis with aspiration of vomit was noted by EMS. PMH: DM, CVA (2013), CABG. CXR 1/9 no gross infiltrate or pulmonary edema. Bilateral basilar atelectasis. CT no acute intracranial findings. Found to be septic of unknown source, suspected aspiration and UTI .  Also demonstrating cloudy drainage from pacemaker pocket.  Clinical Impression  Patient presents with significant dependencies for mobility due to deficits listed in PT problem list.  He will benefit from skilled PT in the acute setting to allow return to SNF for continued rehab with decreased burden of care.      Follow Up Recommendations SNF    Equipment Recommendations  None recommended by PT    Recommendations for Other Services       Precautions / Restrictions Precautions Precautions: Fall      Mobility  Bed Mobility Overal bed mobility: Needs Assistance;+2 for physical assistance Bed Mobility: Rolling;Sidelying to Sit Rolling: Max assist Sidelying to sit: Max assist;+2 for physical assistance       General bed mobility comments: cues and assist to reach rail to roll R, assist to bring hips over, feet off bed and to lift trunk upright  Transfers Overall transfer level: Needs assistance   Transfers: Sit to/from Stand Sit to Stand: Max assist;+2 physical assistance         General transfer comment: sit<>squat without walker at pt's request; bilateral assist at shoulders for anterior weight shift, lifting assist from pad with bed height elevated, stood in squat position with posterior bias few seconds prior to returning to sitting on bed; deferred OOB to chair at RN request due to pt going  for pacemaker removal this PM  Ambulation/Gait                Stairs            Wheelchair Mobility    Modified Rankin (Stroke Patients Only)       Balance Overall balance assessment: Needs assistance Sitting-balance support: Feet supported;Bilateral upper extremity supported Sitting balance-Leahy Scale: Poor Sitting balance - Comments: mod A at least to maintain sitting initially, pt reaching forward for arm of recliner to keep from falling back and to L; after sit<>squat trials demonstrated sitting with supervision x about 1-2 minutes Postural control: Posterior lean;Left lateral lean   Standing balance-Leahy Scale: Zero Standing balance comment: unable to stand upright or maintain upright for more than few seconds with max +2 A                             Pertinent Vitals/Pain Faces Pain Scale: Hurts even more Pain Location: L hand with movement, pressure Pain Descriptors / Indicators: Sore Pain Intervention(s): Monitored during session;Repositioned    Home Living Family/patient expects to be discharged to:: Skilled nursing facility                      Prior Function Level of Independence: Needs assistance   Gait / Transfers Assistance Needed: reports hasn't walked in a while, needs help with all mobility           Hand Dominance   Dominant Hand: Right    Extremity/Trunk Assessment  Upper Extremity Assessment: RUE deficits/detail;LUE deficits/detail RUE Deficits / Details: AAROM limited to about 80 degrees shoulder flexion , elbow WFL, wrist and hand WFL, strength elbow flexion 3+/5, shoulder flexion 2-/5, edema throughout wrist, hand and forearm     LUE Deficits / Details: AAROM limited to about 80 degrees shoulder flexion , elbow WFL, wrist and hand WFL, strength elbow flexion 3+/5, shoulder flexion 2-/5, edema throughout wrist, hand and forearm   Lower Extremity Assessment: RLE deficits/detail;LLE deficits/detail RLE  Deficits / Details: AAROM ankle DF approx -20, hip and knee grossly WFL, strenth grossly 3-/5 LLE Deficits / Details: AAROM ankle DF approx -20, hip and knee grossly WFL, strenth grossly 3-/5  Cervical / Trunk Assessment: Kyphotic;Other exceptions  Communication   Communication: No difficulties  Cognition Arousal/Alertness: Lethargic Behavior During Therapy: Flat affect Overall Cognitive Status: No family/caregiver present to determine baseline cognitive functioning                      General Comments      Exercises General Exercises - Upper Extremity Shoulder Flexion: AAROM;Both;5 reps;Supine General Exercises - Lower Extremity Heel Slides: AAROM;Both;5 reps;Supine      Assessment/Plan    PT Assessment Patient needs continued PT services  PT Diagnosis Generalized weakness   PT Problem List Decreased strength;Decreased range of motion;Decreased activity tolerance;Decreased balance;Decreased mobility;Decreased knowledge of use of DME;Decreased knowledge of precautions  PT Treatment Interventions DME instruction;Balance training;Neuromuscular re-education;Functional mobility training;Patient/family education;Therapeutic activities;Therapeutic exercise;Wheelchair mobility training   PT Goals (Current goals can be found in the Care Plan section) Acute Rehab PT Goals Patient Stated Goal: None stated PT Goal Formulation: Patient unable to participate in goal setting Time For Goal Achievement: 02/11/15 Potential to Achieve Goals: Fair    Frequency Min 2X/week   Barriers to discharge        Co-evaluation               End of Session   Activity Tolerance: Patient limited by fatigue Patient left: in bed;with call bell/phone within reach;with nursing/sitter in room           Time: 0915-0940 PT Time Calculation (min) (ACUTE ONLY): 25 min   Charges:   PT Evaluation $PT Eval High Complexity: 1 Procedure PT Treatments $Therapeutic Activity: 8-22 mins    PT G CodesReginia Naas 02-04-15, 10:25 AM  Magda Kiel, [place] 04-Feb-2015

## 2015-01-28 NOTE — H&P (View-Only) (Signed)
ELECTROPHYSIOLOGY CONSULT NOTE    Patient ID: [name] MRN: [id number], DOB/AGE: 10-14-1940 75 y.o.  Admit date: 01/26/2015 Date of Consult: 01/28/2015  Primary Physician: Cathlean Cower, MD Primary Cardiologist: Julianne Handler Electrophysiologist: Lovena Le Referring Physician: Broadus John  Reason for Consultation: bacteremia and purulent drainage from PPM pocket  HPI:  [name] is a 75 y.o. 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  He has dementia and resides at a SNF.  History is obtained from patient and chart review due to dementia.  He was admitted 01/26/15 after the nursing home staff noticed that he was confused more so than normal. He was hypoxic and tachycardic. EMS was called and he was transferred to [facility] for further evaluation.  He was found to be bacteremic with gram positive cocci in pairs in blood cultures. He has been placed on antibiotics.  He has purulent drainage from his pacemaker pocket and EP has been asked to evaluate.  He states that he is hungry this morning. He denies chest pain, shortness of breath, recent fevers, chills, nausea or vomiting (although with dementia, ROS is difficult to be sure is accurate).   Last echo 12/2010 demonstrated EF 40%, moderate LVH, LA 53.   Past Medical History  Diagnosis Date  . CAD (coronary artery disease)     a. s/p CABG  . Obesity   . BPH (benign prostatic hypertrophy)   . UNIVERSAL ULCERATIVE COLITIS         . Ischemic cardiomyopathy    . DM         . OBSTRUCTIVE SLEEP APNEA         . HYPERTENSION         . Hyperlipidemia    . Gout    . Bipolar affective disorder ([place])    . TIA (transient ischemic attack)    . Chronic pain    . CVA (cerebral infarction)      Small left occipital  . Narcolepsy    . History of alcohol abuse    . Depression    . [facility] spotted fever   . Benign neoplasm of  colon      Adenomatous polyps  . 2Nd degree AV block     a. s/p STJ dual chamber pacemaker 11/2014     Surgical History:  Past Surgical History  Procedure Laterality Date  . Coronary artery bypass graft      Median sternotomy, extracorporeal circulation,  coronary artery bypass graft surgery x4 using a sequential left internal   mammary artery graft to the mid and distal left anterior descending, a   saphenous vein graft to diagonal branch of the left anterior descending,   and a saphenous vein graft to the obtuse marginal branch of left   circumflex coronary artery.    . Cardiac catheterization      Triple-vessel coronary artery disease. Low-normal left ventricular systolic function with mild   anteroapical wall motion abnormality.   . Tonsilectomy, adenoidectomy, bilateral myringotomy and tubes  1946  . Multiple extractions with alveoloplasty  12/01/2011    Procedure: MULTIPLE EXTRACION WITH ALVEOLOPLASTY;  Surgeon: Lenn Cal, DDS;  Location: [facility];  Service: Oral Surgery;  Laterality: N/A;  Extraction of tooth #'s 3,4,5,6,7,8,9,10,11,12,13,14,15,16,17,20,21,22,23,24,25,26,27,28,29,30,31,32 with alveoloplasty and bilateral mandibular lingual tori  . Flexible sigmoidoscopy  01/17/2012    Procedure: FLEXIBLE SIGMOIDOSCOPY;  Surgeon: Lafayette Dragon, MD;  Location: [facility];  Service: Endoscopy;  Laterality: N/A;  . Ep implantable device N/A 12/12/2014    Procedure: Pacemaker Implant;  Surgeon: [name], MD;  Location: [facility];  Service: Cardiovascular;  Laterality: N/A;     Prescriptions prior to admission  Medication Sig Dispense Refill Last Dose  . acetaminophen (TYLENOL) 325 MG tablet Take 650 mg by mouth every 4 (four) hours as needed (pain). Do not exceed 4 gm of acetaminophen in 24 hours   01/26/2015 at 0830  . aspirin 81 MG chewable tablet Chew 81 mg by mouth at bedtime.   01/25/2015 at Unknown time  . clonazePAM (KLONOPIN) 0.5 MG tablet Take 0.5 mg by mouth  See admin instructions. Take 1 tablet (0.5 mg) by mouth daily at bedtime, may also take 1 tablet (0.5 mg) 3 times daily as needed for anxiety   01/25/2015 at Unknown time  . doxazosin (CARDURA) 1 MG tablet Take 1 mg by mouth at bedtime.    01/25/2015 at Unknown time  . gabapentin (NEURONTIN) 100 MG capsule Take 100 mg by mouth at bedtime.   01/25/2015 at Unknown time  . guaifenesin (ROBITUSSIN) 100 MG/5ML syrup Take 100 mg by mouth every 6 (six) hours as needed for cough.    unk  . insulin glargine (LANTUS) 100 UNIT/ML injection Inject 35 Units into the skin 2 (two) times daily. 10am and 5pm   01/26/2015 at 0900  . insulin lispro (HUMALOG KWIKPEN) 100 UNIT/ML KiwkPen Inject 4-10 Units into the skin 4 (four) times daily. Per sliding scale: CBG 201-25 4 units, 251-300 6 units, 301-350 8 units, 351-400 10 units, >400 12 units and call MD (at 6:30am, 11:30am, 4:30 pm, 9pm)   01/26/2015 at Unknown time  . lamoTRIgine (LAMICTAL) 100 MG tablet Take 100 mg by mouth daily. 10am   01/25/2015 at Unknown time  . loperamide (IMODIUM) 2 MG capsule Take 4 mg by mouth as needed (after each loose stool).    unk  . nitroGLYCERIN (NITROSTAT) 0.4 MG SL tablet Place 0.4 mg under the tongue every 5 (five) minutes as needed for chest pain.   unk  . oxyCODONE (OXY IR/ROXICODONE) 5 MG immediate release tablet Take 5 mg by mouth every 6 (six) hours.    01/26/2015 at 0600  . oxyCODONE (OXY IR/ROXICODONE) 5 MG immediate release tablet Take 5 mg by mouth 2 (two) times daily as needed for severe pain (pain).   01/26/2015 at 0900  . traMADol (ULTRAM) 50 MG tablet Take 50 mg by mouth every 12 (twelve) hours as needed (pain).    unk  . traZODone (DESYREL) 50 MG tablet Take 50 mg by mouth at bedtime.   01/25/2015 at Unknown time  . Vitamin D, Ergocalciferol, (DRISDOL) 50000 units CAPS capsule Take 50,000 Units by mouth every 7 (seven) days.   01/23/2015    Inpatient Medications:  . antiseptic oral rinse  7 mL Mouth Rinse BID  . aspirin  81 mg Oral QHS   . Chlorhexidine Gluconate Cloth  6 each Topical Q0600  . clonazePAM  0.5 mg Oral QHS  . doxazosin  1 mg Oral QHS  . enoxaparin (LOVENOX) injection  40 mg Subcutaneous Q24H  . gabapentin  100 mg Oral QHS  . insulin aspart  0-15 Units Subcutaneous TID WC  . insulin aspart  0-5 Units Subcutaneous QHS  . insulin glargine  35 Units Subcutaneous BID  . lamoTRIgine  100 mg Oral Daily  . mupirocin ointment  1 application Nasal  BID  . oxyCODONE  5 mg Oral Q6H  . piperacillin-tazobactam (ZOSYN)  IV  3.375 g Intravenous 3 times per day  . sodium chloride  3 mL Intravenous Q12H  . traZODone  50 mg Oral QHS  . vancomycin  1,000 mg Intravenous Once  . vancomycin  1,000 mg Intravenous Q12H    Allergies:  Allergies  Allergen Reactions  . Dexedrine [Dextroamphetamine Sulfate Er] Other (See Comments)    agitation  . Oxycodone Nausea And Vomiting    Social History   Social History  . Marital Status: Married    Spouse Name: N/A  . Number of Children: 2  . Years of Education: 12   Occupational History  . retired    Social History Main Topics  . Smoking status: Former Smoker    Quit date: 01/18/2008  . Smokeless tobacco: Former Systems developer  . Alcohol Use: No  . Drug Use: No  . Sexual Activity: No   Other Topics Concern  . Not on file   Social History Narrative   Patient has been married for 51 years.   Patient presents with his wife today.    Patient has 2 grown children and lives in [place] areas [place].   Patient is a nonsmoker, nondrinker at this time.     Family History  Problem Relation Age of Onset  . Diabetes Father   . Heart disease Father   . Heart attack Mother   . Aortic aneurysm Mother   . Alcohol abuse Other   . Arthritis Other   . Hyperlipidemia Other   . Heart disease Other   . Stroke Other   . Hypertension Other   . Diabetes Other   . Mental illness Other   . Alcohol abuse Other   . Arthritis Other   . Heart disease Other   . Mental  illness Other   . Diabetes Other      Review of Systems: All other systems reviewed and are otherwise negative except as noted above.  Physical Exam: Filed Vitals:   01/27/15 1944 01/27/15 2346 01/28/15 0315 01/28/15 0804  BP: 154/90 99/60 126/81 142/76  Pulse: 90 83 114 47  Temp: 99.9 F (37.7 C) 98.1 F (36.7 C) 98.7 F (37.1 C) 99.2 F (37.3 C)  TempSrc: Oral Axillary Axillary Oral  Resp: 19 22 20 19   Height:      Weight:      SpO2: 93% 94% 95% 94%    GEN- The patient is elderly and chronically ill appearing, alert and oriented x 2 (self, place) today HEENT: normocephalic, atraumatic; sclera clear, conjunctiva pink; hearing intact; oropharynx clear; neck supple, + long beard Lungs- Clear to ausculation bilaterally, normal work of breathing.  No wheezes, rales, rhonchi Heart- Regular rate and rhythm  GI- soft, non-tender, non-distended, bowel sounds present Extremities- no clubbing, cyanosis, left hand edema MS- no significant deformity or atrophy Skin- warm and dry, no rash or lesion, purulent drainage from lateral aspect of pacemaker pocket incision Psych- euthymic mood, full affect Neuro- strength and sensation are intact  Labs:   Lab Results  Component Value Date   WBC 5.3 01/28/2015   HGB 12.1* 01/28/2015   HCT 37.2* 01/28/2015   MCV 93.9 01/28/2015   PLT 120* 01/28/2015    Recent Labs Lab 01/26/15 1200  01/28/15 0540  NA 140  < > 140  K 3.8  < > 3.6  CL 103  < > 106  CO2 23  < > 25  BUN 16  < > 11  CREATININE 1.26*  < > 1.05  CALCIUM 9.0  < > 8.1*  PROT 6.3*  --   --   BILITOT 1.0  --   --   ALKPHOS 44  --   --   ALT 33  --   --   AST 26  --   --   GLUCOSE 414*  < > 253*  < > = values in this interval not displayed.    Radiology/Studies: Dg Abd 1 View 01/26/2015  CLINICAL DATA:  Fever, nausea, vomiting, altered mental status EXAM: ABDOMEN - 1 VIEW COMPARISON:  CT abdomen pelvis 01/26/2015, abdominal radiograph 10/07/2012 FINDINGS: Bones  demineralized. Excreted contrast material within the renal collecting systems bilaterally, RIGHT ureter, and urinary bladder. RIGHT ureter appears mild dilated. Nonobstructive bowel gas pattern with scattered stool throughout colon. No evidence of bowel obstruction or wall thickening. IMPRESSION: Question mild dilatation of the RIGHT ureter though no obstructing stone is identified by prior CT, question due to mild distention of urinary bladder. Nonobstructive bowel gas pattern. Electronically Signed   By: Lavonia Dana M.D.   On: 01/26/2015 17:30   Ct Head Wo Contrast 01/26/2015  CLINICAL DATA:  75 year old with altered mental status. EXAM: CT HEAD WITHOUT CONTRAST TECHNIQUE: Contiguous axial images were obtained from the base of the skull through the vertex without intravenous contrast. COMPARISON:  Head CT 10/07/2012 and 07/22/2012. FINDINGS: There is no evidence of acute intracranial hemorrhage, mass lesion, brain edema or extra-axial fluid collection. Mild generalized atrophy appears unchanged. There is patchy and confluent low-density in the periventricular white matter which appears mildly progressive. 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 Subcortical low-density posteriorly in the right frontal lobe appears progressive (image 24). No evidence of cortical based infarct. Diffuse intracranial vascular calcifications are noted. The visualized paranasal sinuses, mastoid air cells and middle ears are clear. The calvarium is intact. IMPRESSION: Mildly progressive chronic small vessel ischemic changes compared with prior CT from 2014. No acute intracranial findings. Electronically Signed   By: Richardean Sale M.D.   On: 01/26/2015 16:07   Ct Abdomen Pelvis W Contrast 01/26/2015  CLINICAL DATA:  Fever with nausea and vomiting. Altered mental status. History of BPH and atherosclerosis. EXAM: CT ABDOMEN AND PELVIS WITH CONTRAST TECHNIQUE: Multidetector CT imaging of the abdomen and pelvis was  performed using the standard protocol following bolus administration of intravenous contrast. CONTRAST:  132mL OMNIPAQUE IOHEXOL 300 MG/ML  SOLN COMPARISON:  CT 01/18/2012. FINDINGS: Lower chest: Atelectasis at both lung bases is similar to the prior examination. There is mild cardiomegaly status post median sternotomy and CABG. Cardiac pacemaker noted. There is no pleural or pericardial effusion. Hepatobiliary: The hepatic density is diffusely decreased, consistent with steatosis. No focal abnormality observed. No evidence of gallstones, gallbladder wall thickening or biliary dilatation. Pancreas: Unremarkable. No pancreatic ductal dilatation or surrounding inflammatory changes. Spleen: Normal in size without focal abnormality. Adrenals/Urinary Tract: Both adrenal glands appear normal. There are low-density renal cysts bilaterally. No suspicious renal findings are demonstrated. There is high density within the renal collecting systems bilaterally on the early images, appearing to reflect early contrast excretion. Renal calculi are difficult to exclude. There is no evidence of ureteral or bladder calculus. There is no hydronephrosis. The bladder appears stable with mild chronic wall thickening. Stomach/Bowel: No evidence of bowel wall thickening, distention or surrounding inflammatory change. There is mild distal colonic diverticulosis. The appendix appears normal. Vascular/Lymphatic: There are no enlarged abdominal or pelvic  lymph nodes. There is diffuse atherosclerosis of the aorta, its branches and the iliac arteries. Reproductive: The prostate gland appears unchanged. Other: No evidence of abdominal wall mass or hernia. Musculoskeletal: No acute or significant osseous findings. IMPRESSION: 1. No acute findings or explanation for the patient's symptoms. No evidence of bowel obstruction or abscess. 2. Hepatic steatosis. 3. Extensive atherosclerosis. 4. Chronic bibasilar atelectasis and cardiomegaly. 5. Grossly  stable bilateral renal cysts. Renal calculi difficult to exclude. No evidence of ureteral calculus or hydronephrosis. Electronically Signed   By: Richardean Sale M.D.   On: 01/26/2015 16:46   Dg Chest Port 1 View 01/27/2015  CLINICAL DATA:  Sepsis, acute respiratory failure, history of previous CVA. EXAM: PORTABLE CHEST 1 VIEW COMPARISON:  Portable chest x-ray of October 27, 2015 FINDINGS: Patient is is mildly rotated to the left on today's study. The lungs are adequately inflated. There is persistent increased density at the left lung base with partial obscuration of the hemidiaphragm. The cardiac silhouette remains enlarged. The pulmonary vascularity is not engorged. The permanent pacemaker is in reasonable position. There are 7 intact sternal wires from previous CABG. IMPRESSION: Stable appearance of the chest since yesterday's study. There is mild bibasilar subsegmental atelectasis greatest on the left. Electronically Signed   By: David  Martinique M.D.   On: 01/27/2015 07:19   EKG: sinus rhythm, v pacing at 70  TELEMETRY: sinus rhythm, atrial tach with V pacing  DEVICE HISTORY: STJ dual chamber PPM implanted 11/2014 for Mobitz II heart block  Assessment/Plan: 1.  Bacteremia with infected pacemaker pocket The patient presented with sepsis and blood cultures have grown gram positive cocci in pairs. He has purulent drainage from his pacemaker pocket and will require extraction. Will have device interrogated today to see how much he has been pacing. I think he is potentially pacemaker dependent at this point (at wound check, he had no underlying ventricular escape when paced at 30bpm) and will require temp-perm pacemaker placement while infection clears.  Would recommend shaving beard to help keep temp-perm pacemaker site clean  2.  Mobitz II AV block S/p pacemaker implantation now with bacteremia as above Will have device interrogated today, but anticipate that patient will need temp perm pacemaker  while infection clears  3.  CAD s/p CABG The patient denies recent ischemic symptoms Continue current meds  I spoke with the patient's wife about the above plan who is agreeable. She is coming to the hospital today around lunch. Tentatively placed on the board for pacemaker system extraction this afternoon - Dr Lovena Le to see later this morning.   Signed, Chanetta Marshall, NP 01/28/2015 9:00 AM  EP Attending Patient seen and examined. The patient has improved with IV anti-biotics. His exam reveals that His pocket is open, though no longer draining. CV -RRR with no obvious murmur. Lungs are clear. He needs removal of his device and insertion of a temp-perm PM via the left IJ. This will take place later this afternoon. We will have his wife provide consent.   [name]

## 2015-01-28 NOTE — Consult Note (Signed)
ELECTROPHYSIOLOGY CONSULT NOTE    Patient ID: [name] MRN: [id number], DOB/AGE: 1941-01-03 75 y.o.  Admit date: 01/26/2015 Date of Consult: 01/28/2015  Primary Physician: Cathlean Cower, MD Primary Cardiologist: Julianne Handler Electrophysiologist: Lovena Le Referring Physician: Broadus John  Reason for Consultation: bacteremia and purulent drainage from PPM pocket  HPI:  [name] is a 75 y.o. 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  He has dementia and resides at a SNF.  History is obtained from patient and chart review due to dementia.  He was admitted 01/26/15 after the nursing home staff noticed that he was confused more so than normal. He was hypoxic and tachycardic. EMS was called and he was transferred to [facility] for further evaluation.  He was found to be bacteremic with gram positive cocci in pairs in blood cultures. He has been placed on antibiotics.  He has purulent drainage from his pacemaker pocket and EP has been asked to evaluate.  He states that he is hungry this morning. He denies chest pain, shortness of breath, recent fevers, chills, nausea or vomiting (although with dementia, ROS is difficult to be sure is accurate).   Last echo 12/2010 demonstrated EF 40%, moderate LVH, LA 53.   Past Medical History  Diagnosis Date  . CAD (coronary artery disease)     a. s/p CABG  . Obesity   . BPH (benign prostatic hypertrophy)   . UNIVERSAL ULCERATIVE COLITIS         . Ischemic cardiomyopathy    . DM         . OBSTRUCTIVE SLEEP APNEA         . HYPERTENSION         . Hyperlipidemia    . Gout    . Bipolar affective disorder ([place])    . TIA (transient ischemic attack)    . Chronic pain    . CVA (cerebral infarction)      Small left occipital  . Narcolepsy    . History of alcohol abuse    . Depression    . [facility] spotted fever   . Benign neoplasm of  colon      Adenomatous polyps  . 2Nd degree AV block     a. s/p STJ dual chamber pacemaker 11/2014     Surgical History:  Past Surgical History  Procedure Laterality Date  . Coronary artery bypass graft      Median sternotomy, extracorporeal circulation,  coronary artery bypass graft surgery x4 using a sequential left internal   mammary artery graft to the mid and distal left anterior descending, a   saphenous vein graft to diagonal branch of the left anterior descending,   and a saphenous vein graft to the obtuse marginal branch of left   circumflex coronary artery.    . Cardiac catheterization      Triple-vessel coronary artery disease. Low-normal left ventricular systolic function with mild   anteroapical wall motion abnormality.   . Tonsilectomy, adenoidectomy, bilateral myringotomy and tubes  1946  . Multiple extractions with alveoloplasty  12/01/2011    Procedure: MULTIPLE EXTRACION WITH ALVEOLOPLASTY;  Surgeon: Lenn Cal, DDS;  Location: [facility];  Service: Oral Surgery;  Laterality: N/A;  Extraction of tooth #'s 3,4,5,6,7,8,9,10,11,12,13,14,15,16,17,20,21,22,23,24,25,26,27,28,29,30,31,32 with alveoloplasty and bilateral mandibular lingual tori  . Flexible sigmoidoscopy  01/17/2012    Procedure: FLEXIBLE SIGMOIDOSCOPY;  Surgeon: Lafayette Dragon, MD;  Location: [facility];  Service: Endoscopy;  Laterality: N/A;  . Ep implantable device N/A 12/12/2014    Procedure: Pacemaker Implant;  Surgeon: [name], MD;  Location: [facility];  Service: Cardiovascular;  Laterality: N/A;     Prescriptions prior to admission  Medication Sig Dispense Refill Last Dose  . acetaminophen (TYLENOL) 325 MG tablet Take 650 mg by mouth every 4 (four) hours as needed (pain). Do not exceed 4 gm of acetaminophen in 24 hours   01/26/2015 at 0830  . aspirin 81 MG chewable tablet Chew 81 mg by mouth at bedtime.   01/25/2015 at Unknown time  . clonazePAM (KLONOPIN) 0.5 MG tablet Take 0.5 mg by mouth  See admin instructions. Take 1 tablet (0.5 mg) by mouth daily at bedtime, may also take 1 tablet (0.5 mg) 3 times daily as needed for anxiety   01/25/2015 at Unknown time  . doxazosin (CARDURA) 1 MG tablet Take 1 mg by mouth at bedtime.    01/25/2015 at Unknown time  . gabapentin (NEURONTIN) 100 MG capsule Take 100 mg by mouth at bedtime.   01/25/2015 at Unknown time  . guaifenesin (ROBITUSSIN) 100 MG/5ML syrup Take 100 mg by mouth every 6 (six) hours as needed for cough.    unk  . insulin glargine (LANTUS) 100 UNIT/ML injection Inject 35 Units into the skin 2 (two) times daily. 10am and 5pm   01/26/2015 at 0900  . insulin lispro (HUMALOG KWIKPEN) 100 UNIT/ML KiwkPen Inject 4-10 Units into the skin 4 (four) times daily. Per sliding scale: CBG 201-25 4 units, 251-300 6 units, 301-350 8 units, 351-400 10 units, >400 12 units and call MD (at 6:30am, 11:30am, 4:30 pm, 9pm)   01/26/2015 at Unknown time  . lamoTRIgine (LAMICTAL) 100 MG tablet Take 100 mg by mouth daily. 10am   01/25/2015 at Unknown time  . loperamide (IMODIUM) 2 MG capsule Take 4 mg by mouth as needed (after each loose stool).    unk  . nitroGLYCERIN (NITROSTAT) 0.4 MG SL tablet Place 0.4 mg under the tongue every 5 (five) minutes as needed for chest pain.   unk  . oxyCODONE (OXY IR/ROXICODONE) 5 MG immediate release tablet Take 5 mg by mouth every 6 (six) hours.    01/26/2015 at 0600  . oxyCODONE (OXY IR/ROXICODONE) 5 MG immediate release tablet Take 5 mg by mouth 2 (two) times daily as needed for severe pain (pain).   01/26/2015 at 0900  . traMADol (ULTRAM) 50 MG tablet Take 50 mg by mouth every 12 (twelve) hours as needed (pain).    unk  . traZODone (DESYREL) 50 MG tablet Take 50 mg by mouth at bedtime.   01/25/2015 at Unknown time  . Vitamin D, Ergocalciferol, (DRISDOL) 50000 units CAPS capsule Take 50,000 Units by mouth every 7 (seven) days.   01/23/2015    Inpatient Medications:  . antiseptic oral rinse  7 mL Mouth Rinse BID  . aspirin  81 mg Oral QHS   . Chlorhexidine Gluconate Cloth  6 each Topical Q0600  . clonazePAM  0.5 mg Oral QHS  . doxazosin  1 mg Oral QHS  . enoxaparin (LOVENOX) injection  40 mg Subcutaneous Q24H  . gabapentin  100 mg Oral QHS  . insulin aspart  0-15 Units Subcutaneous TID WC  . insulin aspart  0-5 Units Subcutaneous QHS  . insulin glargine  35 Units Subcutaneous BID  . lamoTRIgine  100 mg Oral Daily  . mupirocin ointment  1 application Nasal  BID  . oxyCODONE  5 mg Oral Q6H  . piperacillin-tazobactam (ZOSYN)  IV  3.375 g Intravenous 3 times per day  . sodium chloride  3 mL Intravenous Q12H  . traZODone  50 mg Oral QHS  . vancomycin  1,000 mg Intravenous Once  . vancomycin  1,000 mg Intravenous Q12H    Allergies:  Allergies  Allergen Reactions  . Dexedrine [Dextroamphetamine Sulfate Er] Other (See Comments)    agitation  . Oxycodone Nausea And Vomiting    Social History   Social History  . Marital Status: Married    Spouse Name: N/A  . Number of Children: 2  . Years of Education: 12   Occupational History  . retired    Social History Main Topics  . Smoking status: Former Smoker    Quit date: 01/18/2008  . Smokeless tobacco: Former Systems developer  . Alcohol Use: No  . Drug Use: No  . Sexual Activity: No   Other Topics Concern  . Not on file   Social History Narrative   Patient has been married for 51 years.   Patient presents with his wife today.    Patient has 2 grown children and lives in [place] and [place] areas respectively.   Patient is a nonsmoker, nondrinker at this time.     Family History  Problem Relation Age of Onset  . Diabetes Father   . Heart disease Father   . Heart attack Mother   . Aortic aneurysm Mother   . Alcohol abuse Other   . Arthritis Other   . Hyperlipidemia Other   . Heart disease Other   . Stroke Other   . Hypertension Other   . Diabetes Other   . Mental illness Other   . Alcohol abuse Other   . Arthritis Other   . Heart disease Other   . Mental  illness Other   . Diabetes Other      Review of Systems: All other systems reviewed and are otherwise negative except as noted above.  Physical Exam: Filed Vitals:   01/27/15 1944 01/27/15 2346 01/28/15 0315 01/28/15 0804  BP: 154/90 99/60 126/81 142/76  Pulse: 90 83 114 47  Temp: 99.9 F (37.7 C) 98.1 F (36.7 C) 98.7 F (37.1 C) 99.2 F (37.3 C)  TempSrc: Oral Axillary Axillary Oral  Resp: 19 22 20 19   Height:      Weight:      SpO2: 93% 94% 95% 94%    GEN- The patient is elderly and chronically ill appearing, alert and oriented x 2 (self, place) today HEENT: normocephalic, atraumatic; sclera clear, conjunctiva pink; hearing intact; oropharynx clear; neck supple, + long beard Lungs- Clear to ausculation bilaterally, normal work of breathing.  No wheezes, rales, rhonchi Heart- Regular rate and rhythm  GI- soft, non-tender, non-distended, bowel sounds present Extremities- no clubbing, cyanosis, left hand edema MS- no significant deformity or atrophy Skin- warm and dry, no rash or lesion, purulent drainage from lateral aspect of pacemaker pocket incision Psych- euthymic mood, full affect Neuro- strength and sensation are intact  Labs:   Lab Results  Component Value Date   WBC 5.3 01/28/2015   HGB 12.1* 01/28/2015   HCT 37.2* 01/28/2015   MCV 93.9 01/28/2015   PLT 120* 01/28/2015    Recent Labs Lab 01/26/15 1200  01/28/15 0540  NA 140  < > 140  K 3.8  < > 3.6  CL 103  < > 106  CO2 23  < > 25  BUN 16  < > 11  CREATININE 1.26*  < > 1.05  CALCIUM 9.0  < > 8.1*  PROT 6.3*  --   --   BILITOT 1.0  --   --   ALKPHOS 44  --   --   ALT 33  --   --   AST 26  --   --   GLUCOSE 414*  < > 253*  < > = values in this interval not displayed.    Radiology/Studies: Dg Abd 1 View 01/26/2015  CLINICAL DATA:  Fever, nausea, vomiting, altered mental status EXAM: ABDOMEN - 1 VIEW COMPARISON:  CT abdomen pelvis 01/26/2015, abdominal radiograph 10/07/2012 FINDINGS: Bones  demineralized. Excreted contrast material within the renal collecting systems bilaterally, RIGHT ureter, and urinary bladder. RIGHT ureter appears mild dilated. Nonobstructive bowel gas pattern with scattered stool throughout colon. No evidence of bowel obstruction or wall thickening. IMPRESSION: Question mild dilatation of the RIGHT ureter though no obstructing stone is identified by prior CT, question due to mild distention of urinary bladder. Nonobstructive bowel gas pattern. Electronically Signed   By: Lavonia Dana M.D.   On: 01/26/2015 17:30   Ct Head Wo Contrast 01/26/2015  CLINICAL DATA:  75 year old with altered mental status. EXAM: CT HEAD WITHOUT CONTRAST TECHNIQUE: Contiguous axial images were obtained from the base of the skull through the vertex without intravenous contrast. COMPARISON:  Head CT 10/07/2012 and 07/22/2012. FINDINGS: There is no evidence of acute intracranial hemorrhage, mass lesion, brain edema or extra-axial fluid collection. Mild generalized atrophy appears unchanged. There is patchy and confluent low-density in the periventricular white matter which appears mildly progressive. 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 Subcortical low-density posteriorly in the right frontal lobe appears progressive (image 24). No evidence of cortical based infarct. Diffuse intracranial vascular calcifications are noted. The visualized paranasal sinuses, mastoid air cells and middle ears are clear. The calvarium is intact. IMPRESSION: Mildly progressive chronic small vessel ischemic changes compared with prior CT from 2014. No acute intracranial findings. Electronically Signed   By: Richardean Sale M.D.   On: 01/26/2015 16:07   Ct Abdomen Pelvis W Contrast 01/26/2015  CLINICAL DATA:  Fever with nausea and vomiting. Altered mental status. History of BPH and atherosclerosis. EXAM: CT ABDOMEN AND PELVIS WITH CONTRAST TECHNIQUE: Multidetector CT imaging of the abdomen and pelvis was  performed using the standard protocol following bolus administration of intravenous contrast. CONTRAST:  133mL OMNIPAQUE IOHEXOL 300 MG/ML  SOLN COMPARISON:  CT 01/18/2012. FINDINGS: Lower chest: Atelectasis at both lung bases is similar to the prior examination. There is mild cardiomegaly status post median sternotomy and CABG. Cardiac pacemaker noted. There is no pleural or pericardial effusion. Hepatobiliary: The hepatic density is diffusely decreased, consistent with steatosis. No focal abnormality observed. No evidence of gallstones, gallbladder wall thickening or biliary dilatation. Pancreas: Unremarkable. No pancreatic ductal dilatation or surrounding inflammatory changes. Spleen: Normal in size without focal abnormality. Adrenals/Urinary Tract: Both adrenal glands appear normal. There are low-density renal cysts bilaterally. No suspicious renal findings are demonstrated. There is high density within the renal collecting systems bilaterally on the early images, appearing to reflect early contrast excretion. Renal calculi are difficult to exclude. There is no evidence of ureteral or bladder calculus. There is no hydronephrosis. The bladder appears stable with mild chronic wall thickening. Stomach/Bowel: No evidence of bowel wall thickening, distention or surrounding inflammatory change. There is mild distal colonic diverticulosis. The appendix appears normal. Vascular/Lymphatic: There are no enlarged abdominal or pelvic  lymph nodes. There is diffuse atherosclerosis of the aorta, its branches and the iliac arteries. Reproductive: The prostate gland appears unchanged. Other: No evidence of abdominal wall mass or hernia. Musculoskeletal: No acute or significant osseous findings. IMPRESSION: 1. No acute findings or explanation for the patient's symptoms. No evidence of bowel obstruction or abscess. 2. Hepatic steatosis. 3. Extensive atherosclerosis. 4. Chronic bibasilar atelectasis and cardiomegaly. 5. Grossly  stable bilateral renal cysts. Renal calculi difficult to exclude. No evidence of ureteral calculus or hydronephrosis. Electronically Signed   By: Richardean Sale M.D.   On: 01/26/2015 16:46   Dg Chest Port 1 View 01/27/2015  CLINICAL DATA:  Sepsis, acute respiratory failure, history of previous CVA. EXAM: PORTABLE CHEST 1 VIEW COMPARISON:  Portable chest x-ray of October 27, 2015 FINDINGS: Patient is is mildly rotated to the left on today's study. The lungs are adequately inflated. There is persistent increased density at the left lung base with partial obscuration of the hemidiaphragm. The cardiac silhouette remains enlarged. The pulmonary vascularity is not engorged. The permanent pacemaker is in reasonable position. There are 7 intact sternal wires from previous CABG. IMPRESSION: Stable appearance of the chest since yesterday's study. There is mild bibasilar subsegmental atelectasis greatest on the left. Electronically Signed   By: David  Martinique M.D.   On: 01/27/2015 07:19   EKG: sinus rhythm, v pacing at 70  TELEMETRY: sinus rhythm, atrial tach with V pacing  DEVICE HISTORY: STJ dual chamber PPM implanted 11/2014 for Mobitz II heart block  Assessment/Plan: 1.  Bacteremia with infected pacemaker pocket The patient presented with sepsis and blood cultures have grown gram positive cocci in pairs. He has purulent drainage from his pacemaker pocket and will require extraction. Will have device interrogated today to see how much he has been pacing. I think he is potentially pacemaker dependent at this point (at wound check, he had no underlying ventricular escape when paced at 30bpm) and will require temp-perm pacemaker placement while infection clears.  Would recommend shaving beard to help keep temp-perm pacemaker site clean  2.  Mobitz II AV block S/p pacemaker implantation now with bacteremia as above Will have device interrogated today, but anticipate that patient will need temp perm pacemaker  while infection clears  3.  CAD s/p CABG The patient denies recent ischemic symptoms Continue current meds  I spoke with the patient's wife about the above plan who is agreeable. She is coming to the hospital today around lunch. Tentatively placed on the board for pacemaker system extraction this afternoon - Dr Lovena Le to see later this morning.   Signed, Chanetta Marshall, NP 01/28/2015 9:00 AM  EP Attending Patient seen and examined. The patient has improved with IV anti-biotics. His exam reveals that His pocket is open, though no longer draining. CV -RRR with no obvious murmur. Lungs are clear. He needs removal of his device and insertion of a temp-perm PM via the left IJ. This will take place later this afternoon. We will have his wife provide consent.   [name]

## 2015-01-28 NOTE — Consult Note (Signed)
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 antiseptic oral rinse  7 mL Mouth Rinse BID  . aspirin  81 mg Oral QHS  .  ceFAZolin (ANCEF) IV  2 g Intravenous 3 times per day  . Chlorhexidine Gluconate Cloth  6 each Topical Q0600  . clonazePAM  0.5 mg Oral QHS  . doxazosin  1 mg Oral QHS  . gabapentin  100 mg Oral QHS  . insulin aspart  0-15 Units Subcutaneous TID WC  . insulin aspart  0-5 Units Subcutaneous QHS  . insulin glargine  35 Units Subcutaneous BID  . lamoTRIgine  100 mg Oral Daily  . mupirocin ointment  1 application Nasal BID  . oxyCODONE  5 mg Oral Q6H  . sodium chloride  3 mL Intravenous Q12H  . traZODone  50 mg Oral QHS  . vancomycin  1,000 mg Intravenous Once  . vancomycin  1,000 mg Intravenous Q12H    Recommendations: 1. Continue vancomycin and cefazolin pending antibiotic susceptibility results 2. Await results of repeat blood cultures 3. Agree with pacemaker removal 4. Consider TEE   Assessment: [name] has staph aureus bacteremia complicating a pacemaker pocket infection. Repeat blood cultures have been drawn and are pending. I will treat with vancomycin and cefazolin pending antibiotic susceptibility results for optimal coverage of both MRSA and MSSA. I agree with pacemaker removal. It appears that he is pacer dependent. Current guidelines suggest that he should not  have a new device placed until it has been at least 72 hours after his first negative blood cultures. He will need 4 weeks of IV antibiotic therapy regardless of whether or not he has evidence of endocarditis by TEE. Given his age and underlying dementia I'm not sure that TEE will alter his management or outcome.   HPI: [name] is a 75 y.o. male nursing home resident with dementia who has ischemic cardiomyopathy and heart block. He had a permanent pacemaker placed in November of last year. He was admitted 2 days ago with fever and worsening confusion. He was found to have drainage from his left anterior chest pacemaker pocket. Both admission blood cultures are growing staph aureus.  Review of Systems: Review of Systems  Unable to perform ROS: mental acuity    Past Medical History  Diagnosis Date  . CAD (coronary artery disease)     a. s/p CABG  . Obesity   . BPH (benign prostatic hypertrophy)   . UNIVERSAL ULCERATIVE COLITIS         . Ischemic cardiomyopathy    . DM         . OBSTRUCTIVE SLEEP APNEA         . HYPERTENSION         . Hyperlipidemia    . Gout    .  Bipolar affective disorder ([place])    . TIA (transient ischemic attack)    . Chronic pain    . CVA (cerebral infarction)      Small left occipital  . Narcolepsy    . History of alcohol abuse    . Depression    . [facility] spotted fever   . Benign neoplasm of colon      Adenomatous polyps  . 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 Smoking status: Former Smoker    Quit date: 01/18/2008  . Smokeless tobacco: Former Systems developer  . Alcohol Use: No    Family History  Problem Relation Age of Onset  . Diabetes Father   . Heart disease Father   . Heart attack Mother   . Aortic aneurysm Mother   . Alcohol abuse Other   . Arthritis Other   . Hyperlipidemia Other   . Heart disease Other   . Stroke Other   . Hypertension Other   . Diabetes  Other   . Mental illness Other   . Alcohol abuse Other   . Arthritis Other   . Heart disease Other   . Mental illness Other   . Diabetes Other    Allergies  Allergen Reactions  . Dexedrine [Dextroamphetamine Sulfate Er] Other (See Comments)    agitation  . Oxycodone Nausea And Vomiting    OBJECTIVE: Blood pressure 142/76, pulse 47, temperature 99.2 F (37.3 C), temperature source Oral, resp. rate 19, height 5\' 8"  (1.727 m), weight 239 lb 6.7 oz (108.6 kg), SpO2 94 %.  Physical Exam  Constitutional: No distress.  He is alert and confused. He is currently being shaved.  Cardiovascular: Normal rate and regular rhythm.   No murmur heard. Pulmonary/Chest: Breath sounds normal.  There is an open sinus tract at the lateral edge of his pacemaker pocket incision that is draining purulent fluid.    Lab Results Lab Results  Component Value Date   WBC 5.3 01/28/2015   HGB 12.1* 01/28/2015   HCT 37.2* 01/28/2015   MCV 93.9 01/28/2015   PLT 120* 01/28/2015    Lab Results  Component Value Date   CREATININE 1.05 01/28/2015   BUN 11 01/28/2015   NA 140 01/28/2015   K 3.6 01/28/2015   CL 106 01/28/2015   CO2 25 01/28/2015    Lab Results  Component Value Date   ALT 33 01/26/2015   AST 26 01/26/2015   ALKPHOS 44 01/26/2015   BILITOT 1.0 01/26/2015     Microbiology: Recent Results (from the past 240 hour(s))  Blood Culture (routine x 2)     Status: None (Preliminary result)   Collection Time: 01/26/15 11:16 AM  Result Value Ref Range Status   Specimen Description BLOOD LEFT HAND  Final   Special Requests BOTTLES DRAWN AEROBIC AND ANAEROBIC 4CC  Final   Culture  Setup Time   Final    GRAM POSITIVE COCCI IN CLUSTERS IN BOTH AEROBIC AND ANAEROBIC BOTTLES CRITICAL RESULT CALLED TO, READ BACK BY AND VERIFIED WITH: T NEILSON @ [id number] 01/27/15 MKELLYM    Culture   Final    STAPHYLOCOCCUS AUREUS SUSCEPTIBILITIES TO FOLLOW    Report Status PENDING  Incomplete  Blood Culture  (routine x 2)     Status: None (Preliminary result)   Collection Time: 01/26/15 11:55 AM  Result Value Ref Range Status   Specimen Description BLOOD  LEFT ANTECUBITAL  Final   Special Requests BOTTLES DRAWN AEROBIC AND ANAEROBIC 5CC  Final   Culture  Setup Time   Final    GRAM POSITIVE COCCI IN CLUSTERS IN BOTH AEROBIC AND ANAEROBIC BOTTLES CRITICAL RESULT CALLED TO, READ BACK BY AND VERIFIED WITH: Candiss Norse [id number] 01/27/15 MKELLY    Culture STAPHYLOCOCCUS AUREUS  Final   Report Status PENDING  Incomplete  Urine culture     Status: None   Collection Time: 01/26/15  2:47 PM  Result Value Ref Range Status   Specimen Description URINE, RANDOM  Final   Special Requests NONE  Final   Culture NO GROWTH 1 DAY  Final   Report Status 01/27/2015 FINAL  Final  MRSA PCR Screening     Status: Abnormal   Collection Time: 01/26/15 10:20 PM  Result Value Ref Range Status   MRSA by PCR POSITIVE (A) NEGATIVE Final    Comment:        The GeneXpert MRSA Assay (FDA approved for NASAL specimens only), is one component of a comprehensive MRSA colonization surveillance program. It is not intended to diagnose MRSA infection nor to guide or monitor treatment for MRSA infections. RESULT CALLED TO, READ BACK BY AND VERIFIED WITH: T WILSON,RN @0134  01/27/15 MKELLY   Culture, blood (routine x 2)     Status: None (Preliminary result)   Collection Time: 01/27/15  4:42 PM  Result Value Ref Range Status   Specimen Description BLOOD RIGHT HAND  Final   Special Requests IN PEDIATRIC BOTTLE 3CC  Final   Culture PENDING  Incomplete   Report Status PENDING  Incomplete  Culture, blood (routine x 2)     Status: None (Preliminary result)   Collection Time: 01/27/15  4:44 PM  Result Value Ref Range Status   Specimen Description BLOOD LEFT HAND  Final   Special Requests IN PEDIATRIC BOTTLE [place]  Final   Culture PENDING  Incomplete   Report Status PENDING  Incomplete    Michel Bickers, MD [facility]  [facility] [place] [facility] [phone] pager   [phone] cell 01/28/2015, 11:16 AM

## 2015-01-28 NOTE — Progress Notes (Addendum)
[facility] PROGRESS NOTE  [name] [id number] DOB: July 22, 1940 DOA: 01/26/2015 PCP: Cathlean Cower, MD  Assessment/Plan: Sepsis/GPC Bacteremia -I am concerned abt pacemaker pocket being a potential source, purulence noted from sinus tract -CXR with mild basilar atx, UA/CT Abd/FLu PCR negative - will ask Cards and ID for Input, repeat Blood cx ordered -check ECHO -Stop Zosyn, continue Vancomycin, lactic acidosis resolved,  -continue IVF, cut down rate to 75cc/hr  2. Dementia -stable at baseline -lamictal, klonopin PRN  3. Heel pressure ulcer -continue wound care, no s/s of surrounding infection  4. AKi -resolved  5. DM -CBGs in 200s, will increase lantus to 40units BID  6. Chronic pain/neuropathy -continue gabapentin/oxycodone  7. H/o CAD/ICM EF of  -continue ASA  8. 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 Dysphagia -on D3 diet now  DVt proph: Hep SQ  Code Status: DNR Family Communication: none at bedside, called and d/w wife [name] Disposition Plan: Tx to floor  HPI/Subjective: No events overnight, no complaints, feels ok  Objective: Filed Vitals:   01/27/15 2346 01/28/15 0315  BP: 99/60 126/81  Pulse: 83 114  Temp: 98.1 F (36.7 C) 98.7 F (37.1 C)  Resp: 22 20    Intake/Output Summary (Last 24 hours) at 01/28/15 0806 Last data filed at 01/28/15 0600  Gross per 24 hour  Intake 2492.5 ml  Output      0 ml  Net 2492.5 ml   Filed Weights   01/26/15 1214 01/26/15 2030  Weight: 106.595 kg (235 lb) 108.6 kg (239 lb 6.7 oz)    Exam:   General:  Somnolent, arousable, answers questions, oriented to self, place only             Chest: sinus at lateral edge of pacer pocket with purulent discharge  Cardiovascular: S1S2/RRR  Respiratory: CTAB  Abdomen: soft, NT, BS present  Musculoskeletal:no edema , small pressure sores noted  Data Reviewed: Basic Metabolic Panel:  Recent Labs Lab 01/26/15 1200  01/27/15 0227 01/28/15 0540  NA 140 139 140  K 3.8 3.9 3.6  CL 103 106 106  CO2 23 24 25   GLUCOSE 414* 345* 253*  BUN 16 17 11   CREATININE 1.26* 1.17 1.05  CALCIUM 9.0 8.0* 8.1*   Liver Function Tests:  Recent Labs Lab 01/26/15 1200  AST 26  ALT 33  ALKPHOS 44  BILITOT 1.0  PROT 6.3*  ALBUMIN 3.1*    Recent Labs Lab 01/26/15 1200  LIPASE 19   No results for input(s): AMMONIA in the last 168 hours. CBC:  Recent Labs Lab 01/26/15 1200 01/27/15 0227 01/28/15 0540  WBC 12.1* 9.0 5.3  NEUTROABS 10.5*  --   --   HGB 15.0 12.1* 12.1*  HCT 44.8 37.8* 37.2*  MCV 92.9 95.2 93.9  PLT 142* 127* 120*   Cardiac Enzymes: No results for input(s): CKTOTAL, CKMB, CKMBINDEX, TROPONINI in the last 168 hours. BNP (last 3 results) No results for input(s): BNP in the last 8760 hours.  ProBNP (last 3 results) No results for input(s): PROBNP in the last 8760 hours.  CBG:  Recent Labs Lab 01/26/15 2223 01/27/15 1039 01/27/15 1145 01/27/15 1601 01/27/15 2210  GLUCAP 332* 310* 359* 313* 254*    Recent Results (from the past 240 hour(s))  Blood Culture (routine x 2)     Status: None (Preliminary result)   Collection Time: 01/26/15 11:16 AM  Result Value Ref Range Status   Specimen Description BLOOD LEFT HAND  Final   Special Requests BOTTLES DRAWN AEROBIC AND ANAEROBIC 4CC  Final   Culture  Setup Time   Final    GRAM POSITIVE COCCI IN CLUSTERS IN BOTH AEROBIC AND ANAEROBIC BOTTLES CRITICAL RESULT CALLED TO, READ BACK BY AND VERIFIED WITH: T NEILSON @ 0332 01/27/15 MKELLYM    Culture CULTURE REINCUBATED FOR BETTER GROWTH  Final   Report Status PENDING  Incomplete  Blood Culture (routine x 2)     Status: None (Preliminary result)   Collection Time: 01/26/15 11:55 AM  Result Value Ref Range Status   Specimen Description BLOOD LEFT ANTECUBITAL  Final   Special Requests BOTTLES DRAWN AEROBIC AND ANAEROBIC 5CC  Final   Culture  Setup Time   Final    GRAM POSITIVE  COCCI IN CLUSTERS IN BOTH AEROBIC AND ANAEROBIC BOTTLES CRITICAL RESULT CALLED TO, READ BACK BY AND VERIFIED WITH: Candiss Norse [id number] 01/27/15 MKELLY    Culture CULTURE REINCUBATED FOR BETTER GROWTH  Final   Report Status PENDING  Incomplete  Urine culture     Status: None   Collection Time: 01/26/15  2:47 PM  Result Value Ref Range Status   Specimen Description URINE, RANDOM  Final   Special Requests NONE  Final   Culture NO GROWTH 1 DAY  Final   Report Status 01/27/2015 FINAL  Final  MRSA PCR Screening     Status: Abnormal   Collection Time: 01/26/15 10:20 PM  Result Value Ref Range Status   MRSA by PCR POSITIVE (A) NEGATIVE Final    Comment:        The GeneXpert MRSA Assay (FDA approved for NASAL specimens only), is one component of a comprehensive MRSA colonization surveillance program. It is not intended to diagnose MRSA infection nor to guide or monitor treatment for MRSA infections. RESULT CALLED TO, READ BACK BY AND VERIFIED WITH: T WILSON,RN @0134  01/27/15 MKELLY   Culture, blood (routine x 2)     Status: None (Preliminary result)   Collection Time: 01/27/15  4:42 PM  Result Value Ref Range Status   Specimen Description BLOOD RIGHT HAND  Final   Special Requests IN PEDIATRIC BOTTLE 3CC  Final   Culture PENDING  Incomplete   Report Status PENDING  Incomplete  Culture, blood (routine x 2)     Status: None (Preliminary result)   Collection Time: 01/27/15  4:44 PM  Result Value Ref Range Status   Specimen Description BLOOD LEFT HAND  Final   Special Requests IN PEDIATRIC BOTTLE 2CC  Final   Culture PENDING  Incomplete   Report Status PENDING  Incomplete     Studies: Dg Abd 1 View  01/26/2015  CLINICAL DATA:  Fever, nausea, vomiting, altered mental status EXAM: ABDOMEN - 1 VIEW COMPARISON:  CT abdomen pelvis 01/26/2015, abdominal radiograph 10/07/2012 FINDINGS: Bones demineralized. Excreted contrast material within the renal collecting systems bilaterally, RIGHT ureter,  and urinary bladder. RIGHT ureter appears mild dilated. Nonobstructive bowel gas pattern with scattered stool throughout colon. No evidence of bowel obstruction or wall thickening. IMPRESSION: Question mild dilatation of the RIGHT ureter though no obstructing stone is identified by prior CT, question due to mild distention of urinary bladder. Nonobstructive bowel gas pattern. Electronically Signed   By: Lavonia Dana M.D.   On: 01/26/2015 17:30   Ct Head Wo Contrast  01/26/2015  CLINICAL DATA:  75 year old with altered mental status. EXAM: CT HEAD WITHOUT CONTRAST TECHNIQUE: Contiguous axial images were obtained from the base of the skull through the vertex  without intravenous contrast. COMPARISON:  Head CT 10/07/2012 and 07/22/2012. FINDINGS: There is no evidence of acute intracranial hemorrhage, mass lesion, brain edema or extra-axial fluid collection. Mild generalized atrophy appears unchanged. There is patchy and confluent low-density in the periventricular white matter which appears mildly progressive. 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 Subcortical low-density posteriorly in the right frontal lobe appears progressive (image 24). No evidence of cortical based infarct. Diffuse intracranial vascular calcifications are noted. The visualized paranasal sinuses, mastoid air cells and middle ears are clear. The calvarium is intact. IMPRESSION: Mildly progressive chronic small vessel ischemic changes compared with prior CT from 2014. No acute intracranial findings. Electronically Signed   By: Richardean Sale M.D.   On: 01/26/2015 16:07   Ct Abdomen Pelvis W Contrast  01/26/2015  CLINICAL DATA:  Fever with nausea and vomiting. Altered mental status. History of BPH and atherosclerosis. EXAM: CT ABDOMEN AND PELVIS WITH CONTRAST TECHNIQUE: Multidetector CT imaging of the abdomen and pelvis was performed using the standard protocol following bolus administration of intravenous contrast.  CONTRAST:  172mL OMNIPAQUE IOHEXOL 300 MG/ML  SOLN COMPARISON:  CT 01/18/2012. FINDINGS: Lower chest: Atelectasis at both lung bases is similar to the prior examination. There is mild cardiomegaly status post median sternotomy and CABG. Cardiac pacemaker noted. There is no pleural or pericardial effusion. Hepatobiliary: The hepatic density is diffusely decreased, consistent with steatosis. No focal abnormality observed. No evidence of gallstones, gallbladder wall thickening or biliary dilatation. Pancreas: Unremarkable. No pancreatic ductal dilatation or surrounding inflammatory changes. Spleen: Normal in size without focal abnormality. Adrenals/Urinary Tract: Both adrenal glands appear normal. There are low-density renal cysts bilaterally. No suspicious renal findings are demonstrated. There is high density within the renal collecting systems bilaterally on the early images, appearing to reflect early contrast excretion. Renal calculi are difficult to exclude. There is no evidence of ureteral or bladder calculus. There is no hydronephrosis. The bladder appears stable with mild chronic wall thickening. Stomach/Bowel: No evidence of bowel wall thickening, distention or surrounding inflammatory change. There is mild distal colonic diverticulosis. The appendix appears normal. Vascular/Lymphatic: There are no enlarged abdominal or pelvic lymph nodes. There is diffuse atherosclerosis of the aorta, its branches and the iliac arteries. Reproductive: The prostate gland appears unchanged. Other: No evidence of abdominal wall mass or hernia. Musculoskeletal: No acute or significant osseous findings. IMPRESSION: 1. No acute findings or explanation for the patient's symptoms. No evidence of bowel obstruction or abscess. 2. Hepatic steatosis. 3. Extensive atherosclerosis. 4. Chronic bibasilar atelectasis and cardiomegaly. 5. Grossly stable bilateral renal cysts. Renal calculi difficult to exclude. No evidence of ureteral  calculus or hydronephrosis. Electronically Signed   By: Richardean Sale M.D.   On: 01/26/2015 16:46   Dg Chest Port 1 View  01/27/2015  CLINICAL DATA:  Sepsis, acute respiratory failure, history of previous CVA. EXAM: PORTABLE CHEST 1 VIEW COMPARISON:  Portable chest x-ray of October 27, 2015 FINDINGS: Patient is is mildly rotated to the left on today's study. The lungs are adequately inflated. There is persistent increased density at the left lung base with partial obscuration of the hemidiaphragm. The cardiac silhouette remains enlarged. The pulmonary vascularity is not engorged. The permanent pacemaker is in reasonable position. There are 7 intact sternal wires from previous CABG. IMPRESSION: Stable appearance of the chest since yesterday's study. There is mild bibasilar subsegmental atelectasis greatest on the left. Electronically Signed   By: David  Martinique M.D.   On: 01/27/2015 07:19   Dg Chest [facility]  1 View  01/26/2015  CLINICAL DATA:  Fever EXAM: PORTABLE CHEST 1 VIEW COMPARISON:  12/13/2014 FINDINGS: Cardiomegaly again noted. Study is limited by patient's large body habitus. Status post CABG. Dual lead cardiac pacemaker is unchanged in position. No pulmonary edema. Bilateral basilar atelectasis. No segmental infiltrates. Lung bases are partially obscured by patient's upper abdomen. IMPRESSION: Limited exam by patient's large body habitus. No gross infiltrate or pulmonary edema. Bilateral basilar atelectasis. Dual lead cardiac pacemaker is unchanged in position. Electronically Signed   By: Lahoma Crocker M.D.   On: 01/26/2015 11:39    Scheduled Meds: . antiseptic oral rinse  7 mL Mouth Rinse BID  . aspirin  81 mg Oral QHS  . Chlorhexidine Gluconate Cloth  6 each Topical Q0600  . clonazePAM  0.5 mg Oral QHS  . doxazosin  1 mg Oral QHS  . enoxaparin (LOVENOX) injection  40 mg Subcutaneous Q24H  . gabapentin  100 mg Oral QHS  . insulin aspart  0-15 Units Subcutaneous TID WC  . insulin aspart  0-5  Units Subcutaneous QHS  . insulin glargine  35 Units Subcutaneous BID  . lamoTRIgine  100 mg Oral Daily  . mupirocin ointment  1 application Nasal BID  . oxyCODONE  5 mg Oral Q6H  . piperacillin-tazobactam (ZOSYN)  IV  3.375 g Intravenous 3 times per day  . sodium chloride  3 mL Intravenous Q12H  . traZODone  50 mg Oral QHS  . vancomycin  1,000 mg Intravenous Once  . vancomycin  1,000 mg Intravenous Q12H   Continuous Infusions: . sodium chloride 75 mL/hr at 01/28/15 0535   Antibiotics Given (last 72 hours)    Date/Time Action Medication Dose Rate   01/27/15 0121 Given   piperacillin-tazobactam (ZOSYN) IVPB 3.375 g 3.375 g 12.5 mL/hr   01/27/15 1328 Given  [med not available to give as scheduled]   piperacillin-tazobactam (ZOSYN) IVPB 3.375 g 3.375 g 12.5 mL/hr   01/27/15 1331 Given   vancomycin (VANCOCIN) 1,250 mg in sodium chloride 0.9 % 250 mL IVPB 1,250 mg 166.7 mL/hr   01/27/15 1714 Given   piperacillin-tazobactam (ZOSYN) IVPB 3.375 g 3.375 g 12.5 mL/hr   01/27/15 2347 Given   vancomycin (VANCOCIN) IVPB 1000 mg/200 mL premix 1,000 mg 200 mL/hr   01/28/15 0314 Given   piperacillin-tazobactam (ZOSYN) IVPB 3.375 g 3.375 g 12.5 mL/hr      Active Problems:   DM (diabetes mellitus), type 2 with peripheral vascular complications (HCC)   CAD, ARTERY BYPASS GRAFT   BPH (benign prostatic hypertrophy)   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 If 7PM-7AM, please contact night-coverage at [email or web address], password [facility] 01/28/2015, 8:06 AM  LOS: 2 days

## 2015-01-28 NOTE — Evaluation (Signed)
Occupational Therapy Evaluation Patient Details Name: ASHAAN PALMESE MRN: [id number] DOB: Dec 22, 1940 Today's Date: 01/28/2015    History of Present Illness Shoaib is a 75 y.o. male who lives in assisted living admitted with altered mental state, diaphoretic and baseline dementia. Per note pt had 2 episodes of nonbloody nonbilious emesis with aspiration of vomit was noted by EMS. PMH: DM, CVA (2013), CABG. CXR 1/9 no gross infiltrate or pulmonary edema. Bilateral basilar atelectasis. CT no acute intracranial findings. Found to be septic of unknown source, suspected aspiration and UTI .  Also demonstrating cloudy drainage from pacemaker pocket.   Clinical Impression   This 75 yo male admitted with above presents to acute OT with deficits below as well as needing a lot of A pta per his wife. He will benefit from a trial of acute OT with follow up at SNF as they deem feasible to work on increasing independence and decreasing burden of care of on caregivers.    Follow Up Recommendations  SNF    Equipment Recommendations  None recommended by OT       Precautions / Restrictions Precautions Precautions: Fall Restrictions Weight Bearing Restrictions: No      Mobility Bed Mobility Overal bed mobility: Needs Assistance;+2 for physical assistance Bed Mobility: Rolling;Sidelying to Sit Rolling: Max assist          ADL Overall ADL's : Needs assistance/impaired                                       General ADL Comments: total A at this time due to decreased use/strength of arms     Vision Additional Comments: pt tending to keep his eyes shut          Pertinent Vitals/Pain Pain Assessment: Faces Faces Pain Scale: Hurts even more Pain Location: left and right shoulders with movement AAROM and left hand with movement PROM at digits Pain Descriptors / Indicators: Grimacing Pain Intervention(s): Monitored during session;Repositioned (propped Bil UEs up on pillows  due to edema (LUE worse than RUE))     Hand Dominance Right   Extremity/Trunk Assessment Upper Extremity Assessment Upper Extremity Assessment: RUE deficits/detail;LUE deficits/detail RUE Deficits / Details: AAROM limited to about 80 degrees shoulder flexion , AROM elbow limited only due to mild edema, AROM wrist and hand limited only due to mild edema, supination limited to neutral; strength elbow flexion 3+/5, shoulder flexion 2-/5, edema throughout wrist, hand and forearm RUE Coordination: decreased fine motor;decreased gross motor LUE Deficits / Details: All AROM/AAROM limited by edema; shoulder AAROM to about 80 degrees, elbow AROM limited by edema, wrist AROM limited by edema, supination not quite to neutral, not really able to move fingers. LUE Coordination: decreased fine motor;decreased gross motor     Communication Communication Communication: No difficulties   Cognition Arousal/Alertness: Lethargic Behavior During Therapy: Flat affect Overall Cognitive Status: No family/caregiver present to determine baseline cognitive functioning                                Home Living Family/patient expects to be discharged to:: Skilled nursing facility                                        Prior Functioning/Environment Level of Independence:  Needs assistance  Gait / Transfers Assistance Needed: Wife in room states pt has not walked in 4 years--he was at home with he for about a year post heart sx, then was at [facility] for 2 years and now is at [facility] and rehab rooming with his brother. She reports at [facility] they lifted him to a W/C and that if he was motivated to do so he would/could get himself around in the W/C          OT Diagnosis: Generalized weakness;Acute pain   OT Problem List: Decreased strength;Decreased range of motion;Decreased activity tolerance;Impaired UE functional use;Pain;Obesity   OT  Treatment/Interventions: Self-care/ADL training;Patient/family education;Balance training;Therapeutic activities;Therapeutic exercise    OT Goals(Current goals can be found in the care plan section) Acute Rehab OT Goals Patient Stated Goal: wife: for pt to go back to [facility] OT Goal Formulation: With family Time For Goal Achievement: 02/11/15 Potential to Achieve Goals: Fair  OT Frequency: Min 1X/week              End of Session    Activity Tolerance: Patient limited by fatigue;Patient limited by lethargy Patient left: in bed;with call bell/phone within reach;with bed alarm set;with family/visitor present   Time: [id number] OT Time Calculation (min): 14 min Charges:  OT General Charges $OT Visit: 1 Procedure OT Evaluation $OT Eval Moderate Complexity: 1 Procedure  Almon Register [id number] 01/28/2015, 12:28 PM

## 2015-01-28 NOTE — Progress Notes (Signed)
Blood culture result came back with positive gram cocci and clusters on both sets.

## 2015-01-28 NOTE — Interval H&P Note (Signed)
History and Physical Interval Note:  01/28/2015 3:34 PM  [name]  has presented today for surgery, with the diagnosis of bachterina  The various methods of treatment have been discussed with the patient and family. After consideration of risks, benefits and other options for treatment, the patient has consented to  Procedure(s): PPM Generator Removal (N/A) Temporary Pacemaker (N/A) as a surgical intervention .  The patient's history has been reviewed, patient examined, no change in status, stable for surgery.  I have reviewed the patient's chart and labs.  Questions were answered to the patient's satisfaction.     [name]

## 2015-01-29 ENCOUNTER — Inpatient Hospital Stay (HOSPITAL_COMMUNITY): Payer: Medicare Other

## 2015-01-29 ENCOUNTER — Encounter (HOSPITAL_COMMUNITY): Payer: Self-pay | Admitting: Internal Medicine

## 2015-01-29 ENCOUNTER — Ambulatory Visit (HOSPITAL_COMMUNITY): Payer: Medicare Other

## 2015-01-29 DIAGNOSIS — A4101 Sepsis due to Methicillin susceptible Staphylococcus aureus: Secondary | ICD-10-CM

## 2015-01-29 DIAGNOSIS — T827XXD Infection and inflammatory reaction due to other cardiac and vascular devices, implants and grafts, subsequent encounter: Secondary | ICD-10-CM

## 2015-01-29 DIAGNOSIS — R509 Fever, unspecified: Secondary | ICD-10-CM

## 2015-01-29 DIAGNOSIS — A4902 Methicillin resistant Staphylococcus aureus infection, unspecified site: Secondary | ICD-10-CM

## 2015-01-29 LAB — CULTURE, BLOOD (ROUTINE X 2)

## 2015-01-29 LAB — GLUCOSE, CAPILLARY
GLUCOSE-CAPILLARY: 204 mg/dL — AB (ref 65–99)
GLUCOSE-CAPILLARY: 253 mg/dL — AB (ref 65–99)
GLUCOSE-CAPILLARY: 262 mg/dL — AB (ref 65–99)
Glucose-Capillary: 250 mg/dL — ABNORMAL HIGH (ref 65–99)

## 2015-01-29 MED ORDER — HYDROCODONE-ACETAMINOPHEN 5-325 MG PO TABS
1.0000 | ORAL_TABLET | ORAL | Status: DC | PRN
  Administered 2015-01-29 – 2015-02-01 (×5): 1 via ORAL
  Filled 2015-01-29 (×5): qty 1

## 2015-01-29 NOTE — Care Management Important Message (Signed)
Important Message  Patient Details  Name: [name] MRN: [id number] Date of Birth: [birth year]   Medicare Important Message Given:  Yes    Lacretia Leigh, RN 01/29/2015, 9:23 AM

## 2015-01-29 NOTE — Progress Notes (Signed)
CSW engaged with [facility] via T/C. Per Lexine Baton, Patient can return when medically cleared. Patient currently awaiting negative blood cultures and PICC line. CSW will continue to follow for disposition.    [name], [place] Clinical Social Worker [phone]

## 2015-01-29 NOTE — Progress Notes (Signed)
[facility] PROGRESS NOTE  [name] [id number] DOB: [birth year] DOA: 01/26/2015 PCP: Cathlean Cower, MD  Assessment/Plan: Sepsis/Staphylococcus aureus Bacteremia with pacemaker pocket infection Repeat blood cultures pending, pacemaker has been removed, currently has a temporary pacemaker -CXR with mild basilar atx, UA/CT Abd/FLu PCR negative -New device can be placed 72 hours after his first negative blood culture  ECHO pending Patient currently on vancomycin and cefazolin pending antibiotic sensitivity for optimal coverage of both MRSA and MSSA Patient would need 4 weeks of IV antibiotics regardless of evidence of endocarditis by TEE -continue IVF, cut down rate to 75cc/hr Cardiology recommends PICC line placement in the left arm to preserve his right side for PPM insertion in approx 6 weeks   2. Dementia -stable at baseline -lamictal, klonopin PRN  3. Heel pressure ulcer -continue wound care, no s/s of surrounding infection  4. AKi Repeat renal function  5. DM -CBGs in 200s, will increase lantus to 40units BID  6. Chronic pain/neuropathy -continue gabapentin/oxycodone  7. H/o CAD/ICM EF of  -continue ASA  8. 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 Dysphagia -on D3 diet now  DVt proph: Hep SQ  Code Status: DNR Family Communication: none at bedside, called and d/w wife [name] Disposition Plan: Tx to telemetry   HPI/Subjective: Requesting mouth, unhappy about the delay, denies any pain at the pacemaker site  Objective: Filed Vitals:   01/29/15 0700 01/29/15 0757  BP: 118/75   Pulse: 69   Temp:  98 F (36.7 C)  Resp: 17     Intake/Output Summary (Last 24 hours) at 01/29/15 1018 Last data filed at 01/29/15 0800  Gross per 24 hour  Intake 2362.5 ml  Output      0 ml  Net 2362.5 ml   Filed Weights   01/26/15 1214 01/26/15 2030 01/28/15 1800  Weight: 106.595 kg (235 lb) 108.6 kg (239 lb 6.7 oz) 110.3 kg (243  lb 2.7 oz)    Exam:   General:  Somnolent, arousable, answers questions, oriented to self, place only             Chest: sinus at lateral edge of pacer pocket with purulent discharge  Cardiovascular: S1S2/RRR  Respiratory: CTAB  Abdomen: soft, NT, BS present  Musculoskeletal:no edema , small pressure sores noted  Data Reviewed: Basic Metabolic Panel:  Recent Labs Lab 01/26/15 1200 01/27/15 0227 01/28/15 0540  NA 140 139 140  K 3.8 3.9 3.6  CL 103 106 106  CO2 23 24 25   GLUCOSE 414* 345* 253*  BUN 16 17 11   CREATININE 1.26* 1.17 1.05  CALCIUM 9.0 8.0* 8.1*   Liver Function Tests:  Recent Labs Lab 01/26/15 1200  AST 26  ALT 33  ALKPHOS 44  BILITOT 1.0  PROT 6.3*  ALBUMIN 3.1*    Recent Labs Lab 01/26/15 1200  LIPASE 19   No results for input(s): AMMONIA in the last 168 hours. CBC:  Recent Labs Lab 01/26/15 1200 01/27/15 0227 01/28/15 0540  WBC 12.1* 9.0 5.3  NEUTROABS 10.5*  --   --   HGB 15.0 12.1* 12.1*  HCT 44.8 37.8* 37.2*  MCV 92.9 95.2 93.9  PLT 142* 127* 120*   Cardiac Enzymes: No results for input(s): CKTOTAL, CKMB, CKMBINDEX, TROPONINI in the last 168 hours. BNP (last 3 results) No results for input(s): BNP in the last 8760 hours.  ProBNP (last 3 results) No results for input(s): PROBNP in the last 8760 hours.  CBG:  Recent Labs Lab 01/28/15 0803 01/28/15 1214 01/28/15 1858 01/28/15 2137 01/29/15 0756  GLUCAP 252* 260* 191* 238* 204*    Recent Results (from the past 240 hour(s))  Blood Culture (routine x 2)     Status: None   Collection Time: 01/26/15 11:16 AM  Result Value Ref Range Status   Specimen Description BLOOD LEFT HAND  Final   Special Requests BOTTLES DRAWN AEROBIC AND ANAEROBIC 4CC  Final   Culture  Setup Time   Final    GRAM POSITIVE COCCI IN CLUSTERS IN BOTH AEROBIC AND ANAEROBIC BOTTLES CRITICAL RESULT CALLED TO, READ BACK BY AND VERIFIED WITH: T NEILSON @ [id number] 01/27/15 MKELLYM    Culture  METHICILLIN RESISTANT STAPHYLOCOCCUS AUREUS  Final   Report Status 01/29/2015 FINAL  Final   Organism ID, Bacteria METHICILLIN RESISTANT STAPHYLOCOCCUS AUREUS  Final      Susceptibility   Methicillin resistant staphylococcus aureus - MIC*    CIPROFLOXACIN >=8 RESISTANT Resistant     ERYTHROMYCIN >=8 RESISTANT Resistant     GENTAMICIN <=0.5 SENSITIVE Sensitive     OXACILLIN >=4 RESISTANT Resistant     TETRACYCLINE <=1 SENSITIVE Sensitive     VANCOMYCIN 1 SENSITIVE Sensitive     TRIMETH/SULFA <=10 SENSITIVE Sensitive     CLINDAMYCIN >=8 RESISTANT Resistant     RIFAMPIN <=0.5 SENSITIVE Sensitive     Inducible Clindamycin NEGATIVE Sensitive     * METHICILLIN RESISTANT STAPHYLOCOCCUS AUREUS  Blood Culture (routine x 2)     Status: None   Collection Time: 01/26/15 11:55 AM  Result Value Ref Range Status   Specimen Description BLOOD LEFT ANTECUBITAL  Final   Special Requests BOTTLES DRAWN AEROBIC AND ANAEROBIC 5CC  Final   Culture  Setup Time   Final    GRAM POSITIVE COCCI IN CLUSTERS IN BOTH AEROBIC AND ANAEROBIC BOTTLES CRITICAL RESULT CALLED TO, READ BACK BY AND VERIFIED WITH: T NEILSON 0332 01/27/15 MKELLY    Culture   Final    STAPHYLOCOCCUS AUREUS SUSCEPTIBILITIES PERFORMED ON PREVIOUS CULTURE WITHIN THE LAST 5 DAYS.    Report Status 01/29/2015 FINAL  Final  Urine culture     Status: None   Collection Time: 01/26/15  2:47 PM  Result Value Ref Range Status   Specimen Description URINE, RANDOM  Final   Special Requests NONE  Final   Culture NO GROWTH 1 DAY  Final   Report Status 01/27/2015 FINAL  Final  MRSA PCR Screening     Status: Abnormal   Collection Time: 01/26/15 10:20 PM  Result Value Ref Range Status   MRSA by PCR POSITIVE (A) NEGATIVE Final    Comment:        The GeneXpert MRSA Assay (FDA approved for NASAL specimens only), is one component of a comprehensive MRSA colonization surveillance program. It is not intended to diagnose MRSA infection nor to guide  or monitor treatment for MRSA infections. RESULT CALLED TO, READ BACK BY AND VERIFIED WITH: T WILSON,RN @0134  01/27/15 MKELLY   Culture, blood (routine x 2)     Status: None   Collection Time: 01/27/15  4:42 PM  Result Value Ref Range Status   Specimen Description BLOOD RIGHT HAND  Final   Special Requests IN PEDIATRIC BOTTLE 3CC  Final   Culture  Setup Time   Final    GRAM POSITIVE COCCI IN CLUSTERS PEDIATRIC BOTTLE CRITICAL RESULT CALLED TO, READ BACK BY AND VERIFIED WITH: A AGUIRRE,RN AT 1136 01/28/15 BY L BENFIELD    Culture  Final    STAPHYLOCOCCUS AUREUS SUSCEPTIBILITIES PERFORMED ON PREVIOUS CULTURE WITHIN THE LAST 5 DAYS.    Report Status 01/29/2015 FINAL  Final  Culture, blood (routine x 2)     Status: None   Collection Time: 01/27/15  4:44 PM  Result Value Ref Range Status   Specimen Description BLOOD LEFT HAND  Final   Special Requests IN PEDIATRIC BOTTLE 2CC  Final   Culture  Setup Time   Final    GRAM POSITIVE COCCI IN CLUSTERS PEDIATRIC BOTTLE CRITICAL RESULT CALLED TO, READ BACK BY AND VERIFIED WITH: A AGUIRRE,RN AT 1136 01/28/15 BY L BENFIELD    Culture   Final    STAPHYLOCOCCUS AUREUS SUSCEPTIBILITIES PERFORMED ON PREVIOUS CULTURE WITHIN THE LAST 5 DAYS.    Report Status 01/29/2015 FINAL  Final     Studies: Dg Chest Port 1 View  01/29/2015  CLINICAL DATA:  Check pacer placement EXAM: PORTABLE CHEST 1 VIEW COMPARISON:  01/27/2015 FINDINGS: There is a left chest wall pacer device with lead in the projection of the right ventricle. Heart size is enlarged. Previous median sternotomy and CABG procedure. Mild pulmonary edema is noted. IMPRESSION: 1. The left chest wall pacer device is noted with lead the right ventricle. 2. Mild CHF. Electronically Signed   By: Kerby Moors M.D.   On: 01/29/2015 08:46    Scheduled Meds: . antiseptic oral rinse  7 mL Mouth Rinse BID  . aspirin  81 mg Oral QHS  . Chlorhexidine Gluconate Cloth  6 each Topical Q0600  .  clonazePAM  0.5 mg Oral QHS  . doxazosin  1 mg Oral QHS  . gabapentin  100 mg Oral QHS  . heparin subcutaneous  5,000 Units Subcutaneous 3 times per day  . insulin aspart  0-15 Units Subcutaneous TID WC  . insulin aspart  0-5 Units Subcutaneous QHS  . insulin glargine  40 Units Subcutaneous BID  . lamoTRIgine  100 mg Oral Daily  . mupirocin ointment  1 application Nasal BID  . oxyCODONE  5 mg Oral Q6H  . sodium chloride  3 mL Intravenous Q12H  . traZODone  50 mg Oral QHS  . vancomycin  1,000 mg Intravenous Q12H  . [START ON 01/30/2015] Vitamin D (Ergocalciferol)  50,000 Units Oral Q Fri   Continuous Infusions: . sodium chloride 75 mL/hr at 01/29/15 0800   Antibiotics Given (last 72 hours)    Date/Time Action Medication Dose Rate   01/27/15 0121 Given   piperacillin-tazobactam (ZOSYN) IVPB 3.375 g 3.375 g 12.5 mL/hr   01/27/15 1328 Given  [med not available to give as scheduled]   piperacillin-tazobactam (ZOSYN) IVPB 3.375 g 3.375 g 12.5 mL/hr   01/27/15 1331 Given   vancomycin (VANCOCIN) 1,250 mg in sodium chloride 0.9 % 250 mL IVPB 1,250 mg 166.7 mL/hr   01/27/15 1714 Given   piperacillin-tazobactam (ZOSYN) IVPB 3.375 g 3.375 g 12.5 mL/hr   01/27/15 2347 Given   vancomycin (VANCOCIN) IVPB 1000 mg/200 mL premix 1,000 mg 200 mL/hr   01/28/15 0314 Given   piperacillin-tazobactam (ZOSYN) IVPB 3.375 g 3.375 g 12.5 mL/hr   01/28/15 1637 Given   vancomycin (VANCOCIN) 1,500 mg in sodium chloride 0.9 % 500 mL IVPB 1,500 mg 250 mL/hr   01/28/15 1723 Given   gentamicin (GARAMYCIN) 80 mg in sodium chloride irrigation 0.9 % 500 mL irrigation 80 mg    01/29/15 0013 Given   ceFAZolin (ANCEF) IVPB 2 g/50 mL premix 2 g 100 mL/hr   01/29/15 0406  Given   vancomycin (VANCOCIN) IVPB 1000 mg/200 mL premix 1,000 mg 200 mL/hr      Principal Problem:   Staphylococcus aureus bacteremia with sepsis ([place]) Active Problems:   DM (diabetes mellitus), type 2 with peripheral vascular complications  (HCC)   CAD, ARTERY BYPASS GRAFT   BPH (benign prostatic hypertrophy)   Bipolar affective disorder (HCC)   CVA (cerebral infarction)   Acute respiratory failure ([place])   AKI (acute kidney injury) ([place])   Acute encephalopathy   Infection of pacemaker pocket [facility])    Time spent: 2min    Bayla Mcgovern  [facility] Pager [phone]. If 7PM-7AM, please contact night-coverage at [email or web address], password [facility] 01/29/2015, 10:18 AM  LOS: 3 days

## 2015-01-29 NOTE — Progress Notes (Signed)
Pt Left AC IV site infiltrated. IV removed. Arm elevated and cold compress applied. MD made aware. No new orders at this time. Will continue to monitor.

## 2015-01-29 NOTE — Progress Notes (Signed)
Patient ID: [name], male   DOB: [birth year], 75 y.o.   MRN: [id number]    Patient Name: [name] Date of Encounter: 01/29/2015     Principal Problem:   Staphylococcus aureus bacteremia with sepsis [facility]) Active Problems:   DM (diabetes mellitus), type 2 with peripheral vascular complications (HCC)   CAD, ARTERY BYPASS GRAFT   BPH (benign prostatic hypertrophy)   Bipolar affective disorder (HCC)   CVA (cerebral infarction)   Acute respiratory failure (HCC)   AKI (acute kidney injury) ([place])   Acute encephalopathy   Infection of pacemaker pocket ([place])    SUBJECTIVE  No chest pain or sob. Did not sleep well. Minimal incisional pain.  CURRENT MEDS . antiseptic oral rinse  7 mL Mouth Rinse BID  . aspirin  81 mg Oral QHS  .  ceFAZolin (ANCEF) IV  2 g Intravenous 3 times per day  . Chlorhexidine Gluconate Cloth  6 each Topical Q0600  . clonazePAM  0.5 mg Oral QHS  . doxazosin  1 mg Oral QHS  . gabapentin  100 mg Oral QHS  . heparin subcutaneous  5,000 Units Subcutaneous 3 times per day  . insulin aspart  0-15 Units Subcutaneous TID WC  . insulin aspart  0-5 Units Subcutaneous QHS  . insulin glargine  40 Units Subcutaneous BID  . lamoTRIgine  100 mg Oral Daily  . mupirocin ointment  1 application Nasal BID  . oxyCODONE  5 mg Oral Q6H  . sodium chloride  3 mL Intravenous Q12H  . traZODone  50 mg Oral QHS  . vancomycin  1,000 mg Intravenous Q12H  . [START ON 01/30/2015] Vitamin D (Ergocalciferol)  50,000 Units Oral Q Fri    OBJECTIVE  Filed Vitals:   01/29/15 0500 01/29/15 0600 01/29/15 0700 01/29/15 0757  BP: 111/62 104/65 118/75   Pulse: 69 70 69   Temp:    98 F (36.7 C)  TempSrc:    Oral  Resp: 16 19 17    Height:      Weight:      SpO2: 96% 98% 96%     Intake/Output Summary (Last 24 hours) at 01/29/15 0803 Last data filed at 01/29/15 0800  Gross per 24 hour  Intake 2752.5 ml  Output      0 ml  Net 2752.5 ml   Filed Weights   01/26/15 1214  01/26/15 2030 01/28/15 1800  Weight: 235 lb (106.595 kg) 239 lb 6.7 oz (108.6 kg) 243 lb 2.7 oz (110.3 kg)    PHYSICAL EXAM  General: Pleasant, NAD. Neuro: Alert and oriented X 3. Moves all extremities spontaneously. Psych: Normal affect. HEENT:  Normal  Neck: Supple without bruits or JVD. Lungs:  Resp regular and unlabored, CTA. Heart: RRR no s3, s4, or murmurs. Abdomen: Soft, non-tender, non-distended, BS + x 4.  Extremities: No clubbing, cyanosis or edema. DP/PT/Radials 2+ and equal bilaterally.  Accessory Clinical Findings  CBC  Recent Labs  01/26/15 1200 01/27/15 0227 01/28/15 0540  WBC 12.1* 9.0 5.3  NEUTROABS 10.5*  --   --   HGB 15.0 12.1* 12.1*  HCT 44.8 37.8* 37.2*  MCV 92.9 95.2 93.9  PLT 142* 127* [id number]*   Basic Metabolic Panel  Recent Labs  01/27/15 0227 01/28/15 0540  NA 139 140  K 3.9 3.6  CL 106 106  CO2 24 25  GLUCOSE 345* 253*  BUN 17 11  CREATININE 1.17 1.05  CALCIUM 8.0* 8.1*   Liver Function Tests  Recent Labs  01/26/15 1200  AST 26  ALT 33  ALKPHOS 44  BILITOT 1.0  PROT 6.3*  ALBUMIN 3.1*    Recent Labs  01/26/15 1200  LIPASE 19   Cardiac Enzymes No results for input(s): CKTOTAL, CKMB, CKMBINDEX, TROPONINI in the last 72 hours. BNP Invalid input(s): POCBNP D-Dimer No results for input(s): DDIMER in the last 72 hours. Hemoglobin A1C No results for input(s): HGBA1C in the last 72 hours. Fasting Lipid Panel No results for input(s): CHOL, HDL, LDLCALC, TRIG, CHOLHDL, LDLDIRECT in the last 72 hours. Thyroid Function Tests No results for input(s): TSH, T4TOTAL, T3FREE, THYROIDAB in the last 72 hours.  Invalid input(s): FREET3  TELE  nsr with asynchronous ventricular pacing.  Radiology/Studies  Dg Abd 1 View  01/26/2015  CLINICAL DATA:  Fever, nausea, vomiting, altered mental status EXAM: ABDOMEN - 1 VIEW COMPARISON:  CT abdomen pelvis 01/26/2015, abdominal radiograph 10/07/2012 FINDINGS: Bones demineralized. Excreted  contrast material within the renal collecting systems bilaterally, RIGHT ureter, and urinary bladder. RIGHT ureter appears mild dilated. Nonobstructive bowel gas pattern with scattered stool throughout colon. No evidence of bowel obstruction or wall thickening. IMPRESSION: Question mild dilatation of the RIGHT ureter though no obstructing stone is identified by prior CT, question due to mild distention of urinary bladder. Nonobstructive bowel gas pattern. Electronically Signed   By: Lavonia Dana M.D.   On: 01/26/2015 17:30   Ct Head Wo Contrast  01/26/2015  CLINICAL DATA:  75 year old with altered mental status. EXAM: CT HEAD WITHOUT CONTRAST TECHNIQUE: Contiguous axial images were obtained from the base of the skull through the vertex without intravenous contrast. COMPARISON:  Head CT 10/07/2012 and 07/22/2012. FINDINGS: There is no evidence of acute intracranial hemorrhage, mass lesion, brain edema or extra-axial fluid collection. Mild generalized atrophy appears unchanged. There is patchy and confluent low-density in the periventricular white matter which appears mildly progressive. 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 Subcortical low-density posteriorly in the right frontal lobe appears progressive (image 24). No evidence of cortical based infarct. Diffuse intracranial vascular calcifications are noted. The visualized paranasal sinuses, mastoid air cells and middle ears are clear. The calvarium is intact. IMPRESSION: Mildly progressive chronic small vessel ischemic changes compared with prior CT from 2014. No acute intracranial findings. Electronically Signed   By: Richardean Sale M.D.   On: 01/26/2015 16:07   Ct Abdomen Pelvis W Contrast  01/26/2015  CLINICAL DATA:  Fever with nausea and vomiting. Altered mental status. History of BPH and atherosclerosis. EXAM: CT ABDOMEN AND PELVIS WITH CONTRAST TECHNIQUE: Multidetector CT imaging of the abdomen and pelvis was performed using  the standard protocol following bolus administration of intravenous contrast. CONTRAST:  139mL OMNIPAQUE IOHEXOL 300 MG/ML  SOLN COMPARISON:  CT 01/18/2012. FINDINGS: Lower chest: Atelectasis at both lung bases is similar to the prior examination. There is mild cardiomegaly status post median sternotomy and CABG. Cardiac pacemaker noted. There is no pleural or pericardial effusion. Hepatobiliary: The hepatic density is diffusely decreased, consistent with steatosis. No focal abnormality observed. No evidence of gallstones, gallbladder wall thickening or biliary dilatation. Pancreas: Unremarkable. No pancreatic ductal dilatation or surrounding inflammatory changes. Spleen: Normal in size without focal abnormality. Adrenals/Urinary Tract: Both adrenal glands appear normal. There are low-density renal cysts bilaterally. No suspicious renal findings are demonstrated. There is high density within the renal collecting systems bilaterally on the early images, appearing to reflect early contrast excretion. Renal calculi are difficult to exclude. There is no evidence of ureteral or bladder calculus. There is  no hydronephrosis. The bladder appears stable with mild chronic wall thickening. Stomach/Bowel: No evidence of bowel wall thickening, distention or surrounding inflammatory change. There is mild distal colonic diverticulosis. The appendix appears normal. Vascular/Lymphatic: There are no enlarged abdominal or pelvic lymph nodes. There is diffuse atherosclerosis of the aorta, its branches and the iliac arteries. Reproductive: The prostate gland appears unchanged. Other: No evidence of abdominal wall mass or hernia. Musculoskeletal: No acute or significant osseous findings. IMPRESSION: 1. No acute findings or explanation for the patient's symptoms. No evidence of bowel obstruction or abscess. 2. Hepatic steatosis. 3. Extensive atherosclerosis. 4. Chronic bibasilar atelectasis and cardiomegaly. 5. Grossly stable bilateral  renal cysts. Renal calculi difficult to exclude. No evidence of ureteral calculus or hydronephrosis. Electronically Signed   By: Richardean Sale M.D.   On: 01/26/2015 16:46   Dg Chest Port 1 View  01/27/2015  CLINICAL DATA:  Sepsis, acute respiratory failure, history of previous CVA. EXAM: PORTABLE CHEST 1 VIEW COMPARISON:  Portable chest x-ray of October 27, 2015 FINDINGS: Patient is is mildly rotated to the left on today's study. The lungs are adequately inflated. There is persistent increased density at the left lung base with partial obscuration of the hemidiaphragm. The cardiac silhouette remains enlarged. The pulmonary vascularity is not engorged. The permanent pacemaker is in reasonable position. There are 7 intact sternal wires from previous CABG. IMPRESSION: Stable appearance of the chest since yesterday's study. There is mild bibasilar subsegmental atelectasis greatest on the left. Electronically Signed   By: David  Martinique M.D.   On: 01/27/2015 07:19   Dg Chest Port 1 View  01/26/2015  CLINICAL DATA:  Fever EXAM: PORTABLE CHEST 1 VIEW COMPARISON:  12/13/2014 FINDINGS: Cardiomegaly again noted. Study is limited by patient's large body habitus. Status post CABG. Dual lead cardiac pacemaker is unchanged in position. No pulmonary edema. Bilateral basilar atelectasis. No segmental infiltrates. Lung bases are partially obscured by patient's upper abdomen. IMPRESSION: Limited exam by patient's large body habitus. No gross infiltrate or pulmonary edema. Bilateral basilar atelectasis. Dual lead cardiac pacemaker is unchanged in position. Electronically Signed   By: Lahoma Crocker M.D.   On: 01/26/2015 11:39    ASSESSMENT AND PLAN  1. Complete heart block 2. 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 S/p extraction and insertion of a temp-perm PM 4. Dementia Rec: he appears stable from pacing perspective. We will remove packing material tomorrow. Continue IV anti-biotics. Would  Prefer picc line  placement on left arm side if possible to preserve his right side for PPM insertion in approx 6 weeks. He could be discharged a day after PICC line placement back to SNF. He will need surveillance blood cultures after completion of anti-biotic therapy prior to insertion of a new device.   Kayman Snuffer,M.D.  01/29/2015 8:03 AM

## 2015-01-29 NOTE — Progress Notes (Signed)
*  PRELIMINARY RESULTS* Echocardiogram 2D Echocardiogram has been performed.  [name] 01/29/2015, 10:26 AM

## 2015-01-29 NOTE — Progress Notes (Signed)
Speech Language Pathology Treatment: Dysphagia  Patient Details Name: [name] MRN: [id number] DOB: February 22, 1940 Today's Date: 01/29/2015 Time: 1643-5391 SLP Time Calculation (min) (ACUTE ONLY): 13 min  Assessment / Plan / Recommendation Clinical Impression  SLP provided skilled observation of thin liquids and solids. Swallow and mastication function were determined to be within functional limits. Oral phase appeared [facility]. Explosive cough was noted after continuous sips of thin liquids via straw indicative of likely penetration/aspiration. Pt was educated on compensatory strategies such as small sips/bites and no straws. Pt has met goals and will be discharged from SLP treatment.   HPI HPI: [name] is a 75 y.o. male who lives in assisted living admitted with altered mental state, diaphoretic and baseline dementia. Per note pt had 2 episodes of nonbloody nonbilious emesis with aspiration of vomit was noted by EMS. PMH: DM, CVA (2013), CABG. CXR 1/9 no gross infiltrate or pulmonary edema. Bilateral basilar atelectasis. CT no acute intracranial findings. Found to be septic of unknown source, suspected aspiration and UTI      SLP Plan  Continue with current plan of care     Recommendations  Diet recommendations: Regular;Thin liquid Liquids provided via: Cup;No straw Medication Administration: Whole meds with puree Supervision: Patient able to self feed Compensations: Slow rate;Small sips/bites Postural Changes and/or Swallow Maneuvers: Seated upright 90 degrees              Oral Care Recommendations: Oral care BID Follow up Recommendations:  (TBD) Plan: Continue with current plan of care   Terrance Lanahan 01/29/2015, 10:24 AM   Titus Mould, Student-SLP

## 2015-01-29 NOTE — Progress Notes (Addendum)
Patient ID: [name], male   DOB: 04/01/1940, 75 y.o.   MRN: [id number]         [place] for Infectious Disease    Date of Admission:  01/26/2015   Total days of antibiotics 4         Principal Problem:   Staphylococcus aureus bacteremia with sepsis ([place]) Active Problems:   Infection of pacemaker pocket ([place])   DM (diabetes mellitus), type 2 with peripheral vascular complications (HCC)   CAD, ARTERY BYPASS GRAFT   BPH (benign prostatic hypertrophy)   Bipolar affective disorder (HCC)   CVA (cerebral infarction)   Acute respiratory failure ([place])   AKI (acute kidney injury) ([place])   Acute encephalopathy   . antiseptic oral rinse  7 mL Mouth Rinse BID  . aspirin  81 mg Oral QHS  . Chlorhexidine Gluconate Cloth  6 each Topical Q0600  . clonazePAM  0.5 mg Oral QHS  . doxazosin  1 mg Oral QHS  . gabapentin  100 mg Oral QHS  . heparin subcutaneous  5,000 Units Subcutaneous 3 times per day  . insulin aspart  0-15 Units Subcutaneous TID WC  . insulin aspart  0-5 Units Subcutaneous QHS  . insulin glargine  40 Units Subcutaneous BID  . lamoTRIgine  100 mg Oral Daily  . mupirocin ointment  1 application Nasal BID  . oxyCODONE  5 mg Oral Q6H  . sodium chloride  3 mL Intravenous Q12H  . traZODone  50 mg Oral QHS  . vancomycin  1,000 mg Intravenous Q12H  . [START ON 01/30/2015] Vitamin D (Ergocalciferol)  50,000 Units Oral Q Fri    SUBJECTIVE: He is without complaints.  Review of Systems: Review of Systems  Unable to perform ROS: mental acuity    Past Medical History  Diagnosis Date  . CAD (coronary artery disease)     a. s/p CABG  . Obesity   . BPH (benign prostatic hypertrophy)   . UNIVERSAL ULCERATIVE COLITIS         . Ischemic cardiomyopathy    . DM         . OBSTRUCTIVE SLEEP APNEA         . HYPERTENSION         . Hyperlipidemia    . Gout    . Bipolar affective disorder ([place])    . TIA (transient ischemic attack)    . Chronic pain    . CVA  (cerebral infarction)      Small left occipital  . Narcolepsy    . History of alcohol abuse    . Depression    . [employer] spotted fever   . Benign neoplasm of colon      Adenomatous polyps  . 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 Smoking status: Former Smoker    Quit date: 01/18/2008  . Smokeless tobacco: Former Systems developer  . Alcohol Use: No    Family History  Problem Relation Age of Onset  . Diabetes Father   . Heart disease Father   . Heart attack Mother   . Aortic aneurysm Mother   . Alcohol abuse Other   . Arthritis Other   . Hyperlipidemia Other   . Heart disease Other   . Stroke Other   . Hypertension Other   . Diabetes Other   . Mental illness Other   . Alcohol abuse  Other   . Arthritis Other   . Heart disease Other   . Mental illness Other   . Diabetes Other    Allergies  Allergen Reactions  . Dexedrine [Dextroamphetamine Sulfate Er] Other (See Comments)    agitation  . Oxycodone Nausea And Vomiting    OBJECTIVE: Filed Vitals:   01/29/15 0500 01/29/15 0600 01/29/15 0700 01/29/15 0757  BP: 111/62 104/65 118/75   Pulse: 69 70 69   Temp:    98 F (36.7 C)  TempSrc:    Oral  Resp: 16 19 17    Height:      Weight:      SpO2: 96% 98% 96%    Body mass index is 36.43 kg/(m^2).  Physical Exam  Constitutional: No distress.  Cardiovascular: Normal rate and regular rhythm.   No murmur heard. Pulmonary/Chest: Breath sounds normal.  His pacemaker was extracted yesterday.    Lab Results Lab Results  Component Value Date   WBC 5.3 01/28/2015   HGB 12.1* 01/28/2015   HCT 37.2* 01/28/2015   MCV 93.9 01/28/2015   PLT 120* 01/28/2015    Lab Results  Component Value Date   CREATININE 1.05 01/28/2015   BUN 11 01/28/2015   NA 140 01/28/2015   K 3.6 01/28/2015   CL 106 01/28/2015   CO2 25 01/28/2015    Lab Results  Component Value Date   ALT 33 01/26/2015   AST 26  01/26/2015   ALKPHOS 44 01/26/2015   BILITOT 1.0 01/26/2015     Microbiology: Recent Results (from the past 240 hour(s))  Blood Culture (routine x 2)     Status: None   Collection Time: 01/26/15 11:16 AM  Result Value Ref Range Status   Specimen Description BLOOD LEFT HAND  Final   Special Requests BOTTLES DRAWN AEROBIC AND ANAEROBIC 4CC  Final   Culture  Setup Time   Final    GRAM POSITIVE COCCI IN CLUSTERS IN BOTH AEROBIC AND ANAEROBIC BOTTLES CRITICAL RESULT CALLED TO, READ BACK BY AND VERIFIED WITH: T NEILSON @ [id number] 01/27/15 MKELLYM    Culture METHICILLIN RESISTANT STAPHYLOCOCCUS AUREUS  Final   Report Status 01/29/2015 FINAL  Final   Organism ID, Bacteria METHICILLIN RESISTANT STAPHYLOCOCCUS AUREUS  Final      Susceptibility   Methicillin resistant staphylococcus aureus - MIC*    CIPROFLOXACIN >=8 RESISTANT Resistant     ERYTHROMYCIN >=8 RESISTANT Resistant     GENTAMICIN <=0.5 SENSITIVE Sensitive     OXACILLIN >=4 RESISTANT Resistant     TETRACYCLINE <=1 SENSITIVE Sensitive     VANCOMYCIN 1 SENSITIVE Sensitive     TRIMETH/SULFA <=10 SENSITIVE Sensitive     CLINDAMYCIN >=8 RESISTANT Resistant     RIFAMPIN <=0.5 SENSITIVE Sensitive     Inducible Clindamycin NEGATIVE Sensitive     * METHICILLIN RESISTANT STAPHYLOCOCCUS AUREUS  Blood Culture (routine x 2)     Status: None   Collection Time: 01/26/15 11:55 AM  Result Value Ref Range Status   Specimen Description BLOOD LEFT ANTECUBITAL  Final   Special Requests BOTTLES DRAWN AEROBIC AND ANAEROBIC 5CC  Final   Culture  Setup Time   Final    GRAM POSITIVE COCCI IN CLUSTERS IN BOTH AEROBIC AND ANAEROBIC BOTTLES CRITICAL RESULT CALLED TO, READ BACK BY AND VERIFIED WITH: T NEILSON 0332 01/27/15 MKELLY    Culture   Final    STAPHYLOCOCCUS AUREUS SUSCEPTIBILITIES PERFORMED ON PREVIOUS CULTURE WITHIN THE LAST 5 DAYS.    Report Status 01/29/2015 FINAL  Final  Urine culture     Status: None   Collection Time: 01/26/15  2:47 PM    Result Value Ref Range Status   Specimen Description URINE, RANDOM  Final   Special Requests NONE  Final   Culture NO GROWTH 1 DAY  Final   Report Status 01/27/2015 FINAL  Final  MRSA PCR Screening     Status: Abnormal   Collection Time: 01/26/15 10:20 PM  Result Value Ref Range Status   MRSA by PCR POSITIVE (A) NEGATIVE Final    Comment:        The GeneXpert MRSA Assay (FDA approved for NASAL specimens only), is one component of a comprehensive MRSA colonization surveillance program. It is not intended to diagnose MRSA infection nor to guide or monitor treatment for MRSA infections. RESULT CALLED TO, READ BACK BY AND VERIFIED WITH: T WILSON,RN @0134  01/27/15 MKELLY   Culture, blood (routine x 2)     Status: None   Collection Time: 01/27/15  4:42 PM  Result Value Ref Range Status   Specimen Description BLOOD RIGHT HAND  Final   Special Requests IN PEDIATRIC BOTTLE 3CC  Final   Culture  Setup Time   Final    GRAM POSITIVE COCCI IN CLUSTERS PEDIATRIC BOTTLE CRITICAL RESULT CALLED TO, READ BACK BY AND VERIFIED WITH: A AGUIRRE,RN AT 1136 01/28/15 BY L BENFIELD    Culture   Final    STAPHYLOCOCCUS AUREUS SUSCEPTIBILITIES PERFORMED ON PREVIOUS CULTURE WITHIN THE LAST 5 DAYS.    Report Status 01/29/2015 FINAL  Final  Culture, blood (routine x 2)     Status: None   Collection Time: 01/27/15  4:44 PM  Result Value Ref Range Status   Specimen Description BLOOD LEFT HAND  Final   Special Requests IN PEDIATRIC BOTTLE 2CC  Final   Culture  Setup Time   Final    GRAM POSITIVE COCCI IN CLUSTERS PEDIATRIC BOTTLE CRITICAL RESULT CALLED TO, READ BACK BY AND VERIFIED WITH: A AGUIRRE,RN AT 1136 01/28/15 BY L BENFIELD    Culture   Final    STAPHYLOCOCCUS AUREUS SUSCEPTIBILITIES PERFORMED ON PREVIOUS CULTURE WITHIN THE LAST 5 DAYS.    Report Status 01/29/2015 FINAL  Final     ASSESSMENT: Four of 4 admission blood cultures are growing MRSA. I ordered repeat blood cultures this  morning. Once blood cultures are negative he can have a PICC placed in his left arm in anticipation of 6 weeks of IV vancomycin.  PLAN: 1. Continue vancomycin and await results of repeat blood cultures   Michel Bickers, MD [facility] [place] [phone] pager   [phone] cell 01/29/2015, 10:36 AM

## 2015-01-30 DIAGNOSIS — Z9889 Other specified postprocedural states: Secondary | ICD-10-CM

## 2015-01-30 LAB — CBC
HEMATOCRIT: 37.3 % — AB (ref 39.0–52.0)
HEMOGLOBIN: 12.3 g/dL — AB (ref 13.0–17.0)
MCH: 30.1 pg (ref 26.0–34.0)
MCHC: 33 g/dL (ref 30.0–36.0)
MCV: 91.2 fL (ref 78.0–100.0)
Platelets: 133 10*3/uL — ABNORMAL LOW (ref 150–400)
RBC: 4.09 MIL/uL — AB (ref 4.22–5.81)
RDW: 13.4 % (ref 11.5–15.5)
WBC: 4.6 10*3/uL (ref 4.0–10.5)

## 2015-01-30 LAB — COMPREHENSIVE METABOLIC PANEL
ALBUMIN: 2 g/dL — AB (ref 3.5–5.0)
ALK PHOS: 49 U/L (ref 38–126)
ALT: 27 U/L (ref 17–63)
ANION GAP: 9 (ref 5–15)
AST: 24 U/L (ref 15–41)
BUN: 11 mg/dL (ref 6–20)
CALCIUM: 8.6 mg/dL — AB (ref 8.9–10.3)
CO2: 25 mmol/L (ref 22–32)
Chloride: 106 mmol/L (ref 101–111)
Creatinine, Ser: 0.88 mg/dL (ref 0.61–1.24)
GFR calc Af Amer: >60 mL/min (ref >60)
GFR calc non Af Amer: >60 mL/min (ref >60)
GLUCOSE: 231 mg/dL — AB (ref 65–99)
Potassium: 3.6 mmol/L (ref 3.5–5.1)
SODIUM: 140 mmol/L (ref 135–145)
Total Bilirubin: 0.5 mg/dL (ref 0.3–1.2)
Total Protein: 5 g/dL — ABNORMAL LOW (ref 6.5–8.1)

## 2015-01-30 LAB — GLUCOSE, CAPILLARY
GLUCOSE-CAPILLARY: 226 mg/dL — AB (ref 65–99)
GLUCOSE-CAPILLARY: 247 mg/dL — AB (ref 65–99)
GLUCOSE-CAPILLARY: 230 mg/dL — AB (ref 65–99)
Glucose-Capillary: 243 mg/dL — ABNORMAL HIGH (ref 65–99)

## 2015-01-30 MED ORDER — DOCUSATE SODIUM 100 MG PO CAPS
100.0000 mg | ORAL_CAPSULE | Freq: Two times a day (BID) | ORAL | Status: DC | PRN
  Administered 2015-01-30 – 2015-01-31 (×2): 100 mg via ORAL
  Filled 2015-01-30 (×2): qty 1

## 2015-01-30 NOTE — Progress Notes (Signed)
[facility] PROGRESS NOTE  [name] [name] DOB: 03/24/1940 DOA: 01/26/2015 PCP: Cathlean Cower, MD  Assessment/Plan: Sepsis/Staphylococcus aureus Bacteremia with pacemaker pocket infection Four of 4 admission blood cultures are growing MRSA.   repeat blood cultures ordered 1/12. Once blood cultures are negative he can have a PICC placed in his left arm in anticipation of 6 weeks of IV vancomycin.  pacemaker has been removed, currently has placement of a temp-perm single chamber PPM via the left IJ -CXR with mild basilar atx, UA/CT Abd/FLu PCR negative -New device can be placed 72 hours after his first negative blood culture  ECHO showed EF of 35-40%, diffuse hypokinesis, EF moderately decreased Patient currently on vancomycin   for optimal coverage of both MRSA   -Discontinue continue IVF, given low EF Cardiology recommends PICC line placement in the left arm to preserve his right side for PPM insertion in approx 6 weeks return to the office in 10 days to have his sutures removed.   2. Dementia -stable at baseline -lamictal, klonopin PRN  3. Heel pressure ulcer -continue wound care, no s/s of surrounding infection  4. AKi-resolved Repeat renal function DC fluids  5. DM -CBGs in 200s, will increase lantus to 40units BID  6. Chronic pain/neuropathy -continue gabapentin/oxycodone  7. H/o CAD/ICM EF of  -continue ASA  8. 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 Dysphagia -on D3 diet now  DVt proph: Hep SQ  Code Status: DNR Family Communication: none at bedside, called and d/w wife [name] Disposition Plan: Waiting on repeat blood cultures  HPI/Subjective: Patient denies any pain at the pacemaker site,  Objective: Filed Vitals:   01/30/15 0700 01/30/15 0747  BP: 156/69 145/66  Pulse: 69 70  Temp:  98.6 F (37 C)  Resp: 24 26    Intake/Output Summary (Last 24 hours) at 01/30/15 1017 Last data filed at 01/30/15  0700  Gross per 24 hour  Intake   2890 ml  Output    300 ml  Net   2590 ml   Filed Weights   01/26/15 1214 01/26/15 2030 01/28/15 1800  Weight: 106.595 kg (235 lb) 108.6 kg (239 lb 6.7 oz) 110.3 kg (243 lb 2.7 oz)    Exam:   General:  Somnolent, arousable, answers questions, oriented to self, place only             Chest: sinus at lateral edge of pacer pocket with purulent discharge  Cardiovascular: S1S2/RRR  Respiratory: CTAB  Abdomen: soft, NT, BS present  Musculoskeletal:no edema , small pressure sores noted  Data Reviewed: Basic Metabolic Panel:  Recent Labs Lab 01/26/15 1200 01/27/15 0227 01/28/15 0540 01/30/15 0252  NA 140 139 140 140  K 3.8 3.9 3.6 3.6  CL 103 106 106 106  CO2 23 24 25 25   GLUCOSE 414* 345* 253* 231*  BUN 16 17 11 11   CREATININE 1.26* 1.17 1.05 0.88  CALCIUM 9.0 8.0* 8.1* 8.6*   Liver Function Tests:  Recent Labs Lab 01/26/15 1200 01/30/15 0252  AST 26 24  ALT 33 27  ALKPHOS 44 49  BILITOT 1.0 0.5  PROT 6.3* 5.0*  ALBUMIN 3.1* 2.0*    Recent Labs Lab 01/26/15 1200  LIPASE 19   No results for input(s): AMMONIA in the last 168 hours. CBC:  Recent Labs Lab 01/26/15 1200 01/27/15 0227 01/28/15 0540 01/30/15 0252  WBC 12.1* 9.0 5.3 4.6  NEUTROABS 10.5*  --   --   --  HGB 15.0 12.1* 12.1* 12.3*  HCT 44.8 37.8* 37.2* 37.3*  MCV 92.9 95.2 93.9 91.2  PLT 142* 127* 120* 133*   Cardiac Enzymes: No results for input(s): CKTOTAL, CKMB, CKMBINDEX, TROPONINI in the last 168 hours. BNP (last 3 results) No results for input(s): BNP in the last 8760 hours.  ProBNP (last 3 results) No results for input(s): PROBNP in the last 8760 hours.  CBG:  Recent Labs Lab 01/29/15 0756 01/29/15 1115 01/29/15 1607 01/29/15 2311 01/30/15 0747  GLUCAP 204* 253* 250* 262* 243*    Recent Results (from the past 240 hour(s))  Blood Culture (routine x 2)     Status: None   Collection Time: 01/26/15 11:16 AM  Result Value Ref Range  Status   Specimen Description BLOOD LEFT HAND  Final   Special Requests BOTTLES DRAWN AEROBIC AND ANAEROBIC 4CC  Final   Culture  Setup Time   Final    GRAM POSITIVE COCCI IN CLUSTERS IN BOTH AEROBIC AND ANAEROBIC BOTTLES CRITICAL RESULT CALLED TO, READ BACK BY AND VERIFIED WITH: T NEILSON @ [id number] 01/27/15 MKELLYM    Culture METHICILLIN RESISTANT STAPHYLOCOCCUS AUREUS  Final   Report Status 01/29/2015 FINAL  Final   Organism ID, Bacteria METHICILLIN RESISTANT STAPHYLOCOCCUS AUREUS  Final      Susceptibility   Methicillin resistant staphylococcus aureus - MIC*    CIPROFLOXACIN >=8 RESISTANT Resistant     ERYTHROMYCIN >=8 RESISTANT Resistant     GENTAMICIN <=0.5 SENSITIVE Sensitive     OXACILLIN >=4 RESISTANT Resistant     TETRACYCLINE <=1 SENSITIVE Sensitive     VANCOMYCIN 1 SENSITIVE Sensitive     TRIMETH/SULFA <=10 SENSITIVE Sensitive     CLINDAMYCIN >=8 RESISTANT Resistant     RIFAMPIN <=0.5 SENSITIVE Sensitive     Inducible Clindamycin NEGATIVE Sensitive     * METHICILLIN RESISTANT STAPHYLOCOCCUS AUREUS  Blood Culture (routine x 2)     Status: None   Collection Time: 01/26/15 11:55 AM  Result Value Ref Range Status   Specimen Description BLOOD LEFT ANTECUBITAL  Final   Special Requests BOTTLES DRAWN AEROBIC AND ANAEROBIC 5CC  Final   Culture  Setup Time   Final    GRAM POSITIVE COCCI IN CLUSTERS IN BOTH AEROBIC AND ANAEROBIC BOTTLES CRITICAL RESULT CALLED TO, READ BACK BY AND VERIFIED WITH: T NEILSON 0332 01/27/15 MKELLY    Culture   Final    STAPHYLOCOCCUS AUREUS SUSCEPTIBILITIES PERFORMED ON PREVIOUS CULTURE WITHIN THE LAST 5 DAYS.    Report Status 01/29/2015 FINAL  Final  Urine culture     Status: None   Collection Time: 01/26/15  2:47 PM  Result Value Ref Range Status   Specimen Description URINE, RANDOM  Final   Special Requests NONE  Final   Culture NO GROWTH 1 DAY  Final   Report Status 01/27/2015 FINAL  Final  MRSA PCR Screening     Status: Abnormal    Collection Time: 01/26/15 10:20 PM  Result Value Ref Range Status   MRSA by PCR POSITIVE (A) NEGATIVE Final    Comment:        The GeneXpert MRSA Assay (FDA approved for NASAL specimens only), is one component of a comprehensive MRSA colonization surveillance program. It is not intended to diagnose MRSA infection nor to guide or monitor treatment for MRSA infections. RESULT CALLED TO, READ BACK BY AND VERIFIED WITH: T WILSON,RN @0134  01/27/15 MKELLY   Culture, blood (routine x 2)     Status: None   Collection  Time: 01/27/15  4:42 PM  Result Value Ref Range Status   Specimen Description BLOOD RIGHT HAND  Final   Special Requests IN PEDIATRIC BOTTLE 3CC  Final   Culture  Setup Time   Final    GRAM POSITIVE COCCI IN CLUSTERS PEDIATRIC BOTTLE CRITICAL RESULT CALLED TO, READ BACK BY AND VERIFIED WITH: A AGUIRRE,RN AT 1136 01/28/15 BY L BENFIELD    Culture   Final    STAPHYLOCOCCUS AUREUS SUSCEPTIBILITIES PERFORMED ON PREVIOUS CULTURE WITHIN THE LAST 5 DAYS.    Report Status 01/29/2015 FINAL  Final  Culture, blood (routine x 2)     Status: None   Collection Time: 01/27/15  4:44 PM  Result Value Ref Range Status   Specimen Description BLOOD LEFT HAND  Final   Special Requests IN PEDIATRIC BOTTLE 2CC  Final   Culture  Setup Time   Final    GRAM POSITIVE COCCI IN CLUSTERS PEDIATRIC BOTTLE CRITICAL RESULT CALLED TO, READ BACK BY AND VERIFIED WITH: A AGUIRRE,RN AT 1136 01/28/15 BY L BENFIELD    Culture   Final    STAPHYLOCOCCUS AUREUS SUSCEPTIBILITIES PERFORMED ON PREVIOUS CULTURE WITHIN THE LAST 5 DAYS.    Report Status 01/29/2015 FINAL  Final     Studies: Dg Chest Port 1 View  01/29/2015  CLINICAL DATA:  Check pacer placement EXAM: PORTABLE CHEST 1 VIEW COMPARISON:  01/27/2015 FINDINGS: There is a left chest wall pacer device with lead in the projection of the right ventricle. Heart size is enlarged. Previous median sternotomy and CABG procedure. Mild pulmonary edema is noted.  IMPRESSION: 1. The left chest wall pacer device is noted with lead the right ventricle. 2. Mild CHF. Electronically Signed   By: Kerby Moors M.D.   On: 01/29/2015 08:46    Scheduled Meds: . antiseptic oral rinse  7 mL Mouth Rinse BID  . aspirin  81 mg Oral QHS  . Chlorhexidine Gluconate Cloth  6 each Topical Q0600  . clonazePAM  0.5 mg Oral QHS  . doxazosin  1 mg Oral QHS  . gabapentin  100 mg Oral QHS  . heparin subcutaneous  5,000 Units Subcutaneous 3 times per day  . insulin aspart  0-15 Units Subcutaneous TID WC  . insulin aspart  0-5 Units Subcutaneous QHS  . insulin glargine  40 Units Subcutaneous BID  . lamoTRIgine  100 mg Oral Daily  . mupirocin ointment  1 application Nasal BID  . sodium chloride  3 mL Intravenous Q12H  . traZODone  50 mg Oral QHS  . vancomycin  1,000 mg Intravenous Q12H  . Vitamin D (Ergocalciferol)  50,000 Units Oral Q Fri   Continuous Infusions: . sodium chloride 75 mL/hr at 01/30/15 0700   Antibiotics Given (last 72 hours)    Date/Time Action Medication Dose Rate   01/27/15 1328 Given  [med not available to give as scheduled]   piperacillin-tazobactam (ZOSYN) IVPB 3.375 g 3.375 g 12.5 mL/hr   01/27/15 1331 Given   vancomycin (VANCOCIN) 1,250 mg in sodium chloride 0.9 % 250 mL IVPB 1,250 mg 166.7 mL/hr   01/27/15 1714 Given   piperacillin-tazobactam (ZOSYN) IVPB 3.375 g 3.375 g 12.5 mL/hr   01/27/15 2347 Given   vancomycin (VANCOCIN) IVPB 1000 mg/200 mL premix 1,000 mg 200 mL/hr   01/28/15 0314 Given   piperacillin-tazobactam (ZOSYN) IVPB 3.375 g 3.375 g 12.5 mL/hr   01/28/15 1637 Given   vancomycin (VANCOCIN) 1,500 mg in sodium chloride 0.9 % 500 mL IVPB 1,500 mg 250  mL/hr   01/28/15 1723 Given   gentamicin (GARAMYCIN) 80 mg in sodium chloride irrigation 0.9 % 500 mL irrigation 80 mg    01/29/15 0013 Given   ceFAZolin (ANCEF) IVPB 2 g/50 mL premix 2 g 100 mL/hr   01/29/15 0406 Given   vancomycin (VANCOCIN) IVPB 1000 mg/200 mL premix 1,000  mg 200 mL/hr   01/29/15 1646 Given   vancomycin (VANCOCIN) IVPB 1000 mg/200 mL premix 1,000 mg 200 mL/hr   01/30/15 0404 Given   vancomycin (VANCOCIN) IVPB 1000 mg/200 mL premix 1,000 mg 200 mL/hr      Principal Problem:   Staphylococcus aureus bacteremia with sepsis (HCC) Active Problems:   DM (diabetes mellitus), type 2 with peripheral vascular complications (HCC)   CAD, ARTERY BYPASS GRAFT   BPH (benign prostatic hypertrophy)   Bipolar affective disorder ([place])   CVA (cerebral infarction)   Acute respiratory failure ([place])   AKI (acute kidney injury) ([place])   Acute encephalopathy   Infection of pacemaker pocket [facility])    Time spent: 62min    Kalani Sthilaire  [facility] Pager [phone]. If 7PM-7AM, please contact night-coverage at [email or web address], password [facility] 01/30/2015, 10:17 AM  LOS: 4 days

## 2015-01-30 NOTE — Progress Notes (Signed)
CSW received phone call from [place] at [facility] who reports that Patient's wife called informing them that Patient was on an external pacemaker and if removed the patient would die immediately. Lexine Baton reports concern that they are not equipped for Patient if ready for placement over the weekend. CSW will continue to follow.   Holly Bodily, Latanya Presser [facility] ED Clinical Social Worker [phone]

## 2015-01-30 NOTE — Progress Notes (Signed)
Inpatient Diabetes Program Recommendations  AACE/ADA: New Consensus Statement on Inpatient Glycemic Control (2015)  Target Ranges:  Prepandial:   less than 140 mg/dL      Peak postprandial:   less than 180 mg/dL (1-2 hours)      Critically ill patients:  140 - 180 mg/dL   Review of Glycemic Control  Inpatient Diabetes Program Recommendations:  Correction (SSI): increase to resistant scale during steroid therapy Thank you  Raoul Pitch BSN, RN,CDE Inpatient Diabetes Coordinator [phone] (team pager)

## 2015-01-30 NOTE — Progress Notes (Signed)
Patient ID: [name], male   DOB: April 29, 1940, 75 y.o.   MRN: [id number]         [facility] for Infectious Disease    Date of Admission:  01/26/2015   Total days of antibiotics 5         Principal Problem:   Staphylococcus aureus bacteremia with sepsis ([place]) Active Problems:   Infection of pacemaker pocket ([place])   DM (diabetes mellitus), type 2 with peripheral vascular complications (HCC)   CAD, ARTERY BYPASS GRAFT   BPH (benign prostatic hypertrophy)   Bipolar affective disorder (HCC)   CVA (cerebral infarction)   Acute respiratory failure ([place])   AKI (acute kidney injury) ([place])   Acute encephalopathy   . antiseptic oral rinse  7 mL Mouth Rinse BID  . aspirin  81 mg Oral QHS  . Chlorhexidine Gluconate Cloth  6 each Topical Q0600  . clonazePAM  0.5 mg Oral QHS  . doxazosin  1 mg Oral QHS  . gabapentin  100 mg Oral QHS  . heparin subcutaneous  5,000 Units Subcutaneous 3 times per day  . insulin aspart  0-15 Units Subcutaneous TID WC  . insulin aspart  0-5 Units Subcutaneous QHS  . insulin glargine  40 Units Subcutaneous BID  . lamoTRIgine  100 mg Oral Daily  . mupirocin ointment  1 application Nasal BID  . sodium chloride  3 mL Intravenous Q12H  . traZODone  50 mg Oral QHS  . vancomycin  1,000 mg Intravenous Q12H  . Vitamin D (Ergocalciferol)  50,000 Units Oral Q Fri    SUBJECTIVE: He is feeling better. He tells me that he does not recall being admitted to the hospital.  Review of Systems: Review of Systems  Unable to perform ROS: mental acuity    Past Medical History  Diagnosis Date  . CAD (coronary artery disease)     a. s/p CABG  . Obesity   . BPH (benign prostatic hypertrophy)   . UNIVERSAL ULCERATIVE COLITIS         . Ischemic cardiomyopathy    . DM         . OBSTRUCTIVE SLEEP APNEA         . HYPERTENSION         . Hyperlipidemia    . Gout    . Bipolar affective disorder ([place])    . TIA (transient ischemic attack)    . Chronic pain      . CVA (cerebral infarction)      Small left occipital  . Narcolepsy    . History of alcohol abuse    . Depression    . [facility] spotted fever   . Benign neoplasm of colon      Adenomatous polyps  . 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 Smoking status: Former Smoker    Quit date: 01/18/2008  . Smokeless tobacco: Former Systems developer  . Alcohol Use: No    Family History  Problem Relation Age of Onset  . Diabetes Father   . Heart disease Father   . Heart attack Mother   . Aortic aneurysm Mother   . Alcohol abuse Other   . Arthritis Other   . Hyperlipidemia Other   . Heart disease Other   . Stroke Other   . Hypertension Other   . Diabetes Other   . Mental illness Other   .  Alcohol abuse Other   . Arthritis Other   . Heart disease Other   . Mental illness Other   . Diabetes Other    Allergies  Allergen Reactions  . Dexedrine [Dextroamphetamine Sulfate Er] Other (See Comments)    agitation  . Oxycodone Nausea And Vomiting    OBJECTIVE: Filed Vitals:   01/30/15 1100 01/30/15 1200 01/30/15 1227 01/30/15 1408  BP: 157/92 150/87 158/87 155/90  Pulse: 70 70 70 70  Temp:   98.3 F (36.8 C)   TempSrc:   Oral   Resp: 25 28 16    Height:      Weight:      SpO2: 93% 95% 94%    Body mass index is 36.43 kg/(m^2).  Physical Exam  Constitutional: No distress.  Cardiovascular: Normal rate and regular rhythm.   No murmur heard. Pulmonary/Chest: Breath sounds normal.  He has a clean dry dressing over his left anterior chest incision where his pacemaker was removed.    Lab Results Lab Results  Component Value Date   WBC 4.6 01/30/2015   HGB 12.3* 01/30/2015   HCT 37.3* 01/30/2015   MCV 91.2 01/30/2015   PLT 133* 01/30/2015    Lab Results  Component Value Date   CREATININE 0.88 01/30/2015   BUN 11 01/30/2015   NA 140 01/30/2015   K 3.6 01/30/2015   CL 106 01/30/2015   CO2 25 01/30/2015     Lab Results  Component Value Date   ALT 27 01/30/2015   AST 24 01/30/2015   ALKPHOS 49 01/30/2015   BILITOT 0.5 01/30/2015     Microbiology: Recent Results (from the past 240 hour(s))  Blood Culture (routine x 2)     Status: None   Collection Time: 01/26/15 11:16 AM  Result Value Ref Range Status   Specimen Description BLOOD LEFT HAND  Final   Special Requests BOTTLES DRAWN AEROBIC AND ANAEROBIC 4CC  Final   Culture  Setup Time   Final    GRAM POSITIVE COCCI IN CLUSTERS IN BOTH AEROBIC AND ANAEROBIC BOTTLES CRITICAL RESULT CALLED TO, READ BACK BY AND VERIFIED WITH: T NEILSON @ [id number] 01/27/15 MKELLYM    Culture METHICILLIN RESISTANT STAPHYLOCOCCUS AUREUS  Final   Report Status 01/29/2015 FINAL  Final   Organism ID, Bacteria METHICILLIN RESISTANT STAPHYLOCOCCUS AUREUS  Final      Susceptibility   Methicillin resistant staphylococcus aureus - MIC*    CIPROFLOXACIN >=8 RESISTANT Resistant     ERYTHROMYCIN >=8 RESISTANT Resistant     GENTAMICIN <=0.5 SENSITIVE Sensitive     OXACILLIN >=4 RESISTANT Resistant     TETRACYCLINE <=1 SENSITIVE Sensitive     VANCOMYCIN 1 SENSITIVE Sensitive     TRIMETH/SULFA <=10 SENSITIVE Sensitive     CLINDAMYCIN >=8 RESISTANT Resistant     RIFAMPIN <=0.5 SENSITIVE Sensitive     Inducible Clindamycin NEGATIVE Sensitive     * METHICILLIN RESISTANT STAPHYLOCOCCUS AUREUS  Blood Culture (routine x 2)     Status: None   Collection Time: 01/26/15 11:55 AM  Result Value Ref Range Status   Specimen Description BLOOD LEFT ANTECUBITAL  Final   Special Requests BOTTLES DRAWN AEROBIC AND ANAEROBIC 5CC  Final   Culture  Setup Time   Final    GRAM POSITIVE COCCI IN CLUSTERS IN BOTH AEROBIC AND ANAEROBIC BOTTLES CRITICAL RESULT CALLED TO, READ BACK BY AND VERIFIED WITH: Candiss Norse [id number] 01/27/15 MKELLY    Culture   Final    STAPHYLOCOCCUS AUREUS SUSCEPTIBILITIES PERFORMED ON PREVIOUS  CULTURE WITHIN THE LAST 5 DAYS.    Report Status 01/29/2015 FINAL  Final   Urine culture     Status: None   Collection Time: 01/26/15  2:47 PM  Result Value Ref Range Status   Specimen Description URINE, RANDOM  Final   Special Requests NONE  Final   Culture NO GROWTH 1 DAY  Final   Report Status 01/27/2015 FINAL  Final  MRSA PCR Screening     Status: Abnormal   Collection Time: 01/26/15 10:20 PM  Result Value Ref Range Status   MRSA by PCR POSITIVE (A) NEGATIVE Final    Comment:        The GeneXpert MRSA Assay (FDA approved for NASAL specimens only), is one component of a comprehensive MRSA colonization surveillance program. It is not intended to diagnose MRSA infection nor to guide or monitor treatment for MRSA infections. RESULT CALLED TO, READ BACK BY AND VERIFIED WITH: T WILSON,RN @0134  01/27/15 MKELLY   Culture, blood (routine x 2)     Status: None   Collection Time: 01/27/15  4:42 PM  Result Value Ref Range Status   Specimen Description BLOOD RIGHT HAND  Final   Special Requests IN PEDIATRIC BOTTLE 3CC  Final   Culture  Setup Time   Final    GRAM POSITIVE COCCI IN CLUSTERS PEDIATRIC BOTTLE CRITICAL RESULT CALLED TO, READ BACK BY AND VERIFIED WITH: A AGUIRRE,RN AT 1136 01/28/15 BY L BENFIELD    Culture   Final    STAPHYLOCOCCUS AUREUS SUSCEPTIBILITIES PERFORMED ON PREVIOUS CULTURE WITHIN THE LAST 5 DAYS.    Report Status 01/29/2015 FINAL  Final  Culture, blood (routine x 2)     Status: None   Collection Time: 01/27/15  4:44 PM  Result Value Ref Range Status   Specimen Description BLOOD LEFT HAND  Final   Special Requests IN PEDIATRIC BOTTLE 2CC  Final   Culture  Setup Time   Final    GRAM POSITIVE COCCI IN CLUSTERS PEDIATRIC BOTTLE CRITICAL RESULT CALLED TO, READ BACK BY AND VERIFIED WITH: A AGUIRRE,RN AT 1136 01/28/15 BY L BENFIELD    Culture   Final    STAPHYLOCOCCUS AUREUS SUSCEPTIBILITIES PERFORMED ON PREVIOUS CULTURE WITHIN THE LAST 5 DAYS.    Report Status 01/29/2015 FINAL  Final  Culture, blood (routine x 2)     Status:  None (Preliminary result)   Collection Time: 01/29/15 10:30 AM  Result Value Ref Range Status   Specimen Description BLOOD LEFT HAND  Final   Special Requests BOTTLES DRAWN AEROBIC ONLY 0.5CC  Final   Culture NO GROWTH 1 DAY  Final   Report Status PENDING  Incomplete  Culture, blood (routine x 2)     Status: None (Preliminary result)   Collection Time: 01/29/15 10:41 AM  Result Value Ref Range Status   Specimen Description BLOOD RIGHT HAND  Final   Special Requests BOTTLES DRAWN AEROBIC ONLY 0.5CC  Final   Culture NO GROWTH 1 DAY  Final   Report Status PENDING  Incomplete     ASSESSMENT: Four of 4 admission blood cultures are growing MRSA. Repeat blood cultures are negative at 24 hours. If they remain negative and the safe to have a left arm PICC placed on Sunday, 02/01/2015. I discussed the possible utility of TEE with him. I think it is best that we simply treat him for possible endocarditis and then have a new pacemaker replaced. I will plan on a total of 4 weeks of IV vancomycin therapy  through 02/22/2015.  PLAN: 1. Continue vancomycin through 02/22/2015 2. Place left arm PICC if blood cultures remain negative for next 48 hours 3. I will arrange follow-up in my clinic in early February 4. I will sign off now.   Michel Bickers, MD [facility] for [facility] [phone] pager   [phone] cell 01/30/2015, 4:11 PM

## 2015-01-30 NOTE — Progress Notes (Signed)
Transferred -in from [place] awake and alert, made comfortable on bed.

## 2015-01-30 NOTE — Progress Notes (Signed)
Patient ID: LEVELLE BALSAMO, male   DOB: 08-08-1940, 75 y.o.   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 Denies fever or chills.   CURRENT MEDS . antiseptic oral rinse  7 mL Mouth Rinse BID  . aspirin  81 mg Oral QHS  . Chlorhexidine Gluconate Cloth  6 each Topical Q0600  . clonazePAM  0.5 mg Oral QHS  . doxazosin  1 mg Oral QHS  . gabapentin  100 mg Oral QHS  . heparin subcutaneous  5,000 Units Subcutaneous 3 times per day  . insulin aspart  0-15 Units Subcutaneous TID WC  . insulin aspart  0-5 Units Subcutaneous QHS  . insulin glargine  40 Units Subcutaneous BID  . lamoTRIgine  100 mg Oral Daily  . mupirocin ointment  1 application Nasal BID  . sodium chloride  3 mL Intravenous Q12H  . traZODone  50 mg Oral QHS  . vancomycin  1,000 mg Intravenous Q12H  . Vitamin D (Ergocalciferol)  50,000 Units Oral Q Fri    OBJECTIVE  Filed Vitals:   01/30/15 0500 01/30/15 0600 01/30/15 0700 01/30/15 0747  BP: 169/78 160/96 156/69 145/66  Pulse: 69 70 69 70  Temp:    98.6 F (37 C)  TempSrc:    Oral  Resp: 17 17 24 26   Height:      Weight:      SpO2: 94% 97% 93% 97%    Intake/Output Summary (Last 24 hours) at 01/30/15 0810 Last data filed at 01/30/15 0700  Gross per 24 hour  Intake   3040 ml  Output    300 ml  Net   2740 ml   Filed Weights   01/26/15 1214 01/26/15 2030 01/28/15 1800  Weight: 235 lb (106.595 kg) 239 lb 6.7 oz (108.6 kg) 243 lb 2.7 oz (110.3 kg)     PHYSICAL EXAM  General: Pleasant, elderly, obese, NAD. Neuro: Alert and oriented X 3. Moves all extremities spontaneously. Psych: Normal affect. HEENT:  Normal  Neck: Supple without bruits or JVD. Lungs:  Resp regular and unlabored, CTA. Heart: RRR no s3, s4, or murmurs. Abdomen: Soft, non-tender, non-distended, BS + x 4.  Extremities: No clubbing, cyanosis or edema. DP/PT/Radials 2+ and equal bilaterally.  Accessory Clinical Findings  CBC  Recent Labs  01/28/15 0540 01/30/15 0252  WBC 5.3 4.6  HGB 12.1* 12.3*  HCT 37.2* 37.3*  MCV 93.9 91.2  PLT 120* Q000111Q*   Basic Metabolic Panel  Recent Labs  01/28/15 0540 01/30/15 0252  NA 140 140  K 3.6 3.6  CL 106 106  CO2 25 25  GLUCOSE 253* 231*  BUN 11 11  CREATININE 1.05 0.88  CALCIUM 8.1* 8.6*   Liver Function Tests  Recent Labs  01/30/15 0252  AST 24  ALT 27  ALKPHOS 49  BILITOT 0.5  PROT 5.0*  ALBUMIN 2.0*   No results for input(s): LIPASE, AMYLASE in the last 72 hours. Cardiac Enzymes No results for  input(s): CKTOTAL, CKMB, CKMBINDEX, TROPONINI in the last 72 hours. BNP Invalid input(s): POCBNP D-Dimer No results for input(s): DDIMER in the last 72 hours. Hemoglobin A1C No results for input(s): HGBA1C in the last 72 hours. Fasting Lipid Panel No results for input(s): CHOL, HDL, LDLCALC, TRIG, CHOLHDL, LDLDIRECT in the last 72 hours. Thyroid Function Tests No results for input(s): TSH, T4TOTAL, T3FREE, THYROIDAB in the last 72 hours.  Invalid input(s): FREET3  TELE  nsr with disynchronous ventricular pacing   Radiology/Studies  Dg Abd 1 View  01/26/2015  CLINICAL DATA:  Fever, nausea, vomiting, altered mental status EXAM: ABDOMEN - 1 VIEW COMPARISON:  CT abdomen pelvis 01/26/2015, abdominal radiograph 10/07/2012 FINDINGS: Bones demineralized. Excreted contrast material within the renal collecting systems bilaterally, RIGHT ureter, and urinary bladder. RIGHT ureter appears mild dilated.  Nonobstructive bowel gas pattern with scattered stool throughout colon. No evidence of bowel obstruction or wall thickening. IMPRESSION: Question mild dilatation of the RIGHT ureter though no obstructing stone is identified by prior CT, question due to mild distention of urinary bladder. Nonobstructive bowel gas pattern. Electronically Signed   By: Lavonia Dana M.D.   On: 01/26/2015 17:30   Ct Head Wo Contrast  01/26/2015  CLINICAL DATA:  75 year old with altered mental status. EXAM: CT HEAD WITHOUT CONTRAST TECHNIQUE: Contiguous axial images were obtained from the base of the skull through the vertex without intravenous contrast. COMPARISON:  Head CT 10/07/2012 and 07/22/2012. FINDINGS: There is no evidence of acute intracranial hemorrhage, mass lesion, brain edema or extra-axial fluid collection. Mild generalized atrophy appears unchanged. There is patchy and confluent low-density in the periventricular white matter which appears mildly progressive. 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 Subcortical low-density posteriorly in the right frontal lobe appears progressive (image 24). No evidence of cortical based infarct. Diffuse intracranial vascular calcifications are noted. The visualized paranasal sinuses, mastoid air cells and middle ears are clear. The calvarium is intact. IMPRESSION: Mildly progressive chronic small vessel ischemic changes compared with prior CT from 2014. No acute intracranial findings. Electronically Signed   By: Richardean Sale M.D.   On: 01/26/2015 16:07   Ct Abdomen Pelvis W Contrast  01/26/2015  CLINICAL DATA:  Fever with nausea and vomiting. Altered mental status. History of BPH and atherosclerosis. EXAM: CT ABDOMEN AND PELVIS WITH CONTRAST TECHNIQUE: Multidetector CT imaging of the abdomen and pelvis was performed using the standard protocol following bolus administration of intravenous contrast. CONTRAST:  158mL OMNIPAQUE IOHEXOL 300 MG/ML  SOLN  COMPARISON:  CT 01/18/2012. FINDINGS: Lower chest: Atelectasis at both lung bases is similar to the prior examination. There is mild cardiomegaly status post median sternotomy and CABG. Cardiac pacemaker noted. There is no pleural or pericardial effusion. Hepatobiliary: The hepatic density is diffusely decreased, consistent with steatosis. No focal abnormality observed. No evidence of gallstones, gallbladder wall thickening or biliary dilatation. Pancreas: Unremarkable. No pancreatic ductal dilatation or surrounding inflammatory changes. Spleen: Normal in size without focal abnormality. Adrenals/Urinary Tract: Both adrenal glands appear normal. There are low-density renal cysts bilaterally. No suspicious renal findings are demonstrated. There is high density within the renal collecting systems bilaterally on the early images, appearing to reflect early contrast excretion. Renal calculi are difficult to exclude. There is no evidence of ureteral or bladder calculus. There is no hydronephrosis. The bladder appears stable with mild chronic wall thickening. Stomach/Bowel: No evidence of bowel wall thickening, distention or surrounding inflammatory change. There is mild distal colonic diverticulosis. The appendix appears normal. Vascular/Lymphatic: There are no  enlarged abdominal or pelvic lymph nodes. There is diffuse atherosclerosis of the aorta, its branches and the iliac arteries. Reproductive: The prostate gland appears unchanged. Other: No evidence of abdominal wall mass or hernia. Musculoskeletal: No acute or significant osseous findings. IMPRESSION: 1. No acute findings or explanation for the patient's symptoms. No evidence of bowel obstruction or abscess. 2. Hepatic steatosis. 3. Extensive atherosclerosis. 4. Chronic bibasilar atelectasis and cardiomegaly. 5. Grossly stable bilateral renal cysts. Renal calculi difficult to exclude. No evidence of ureteral calculus or hydronephrosis. Electronically Signed   By:  Richardean Sale M.D.   On: 01/26/2015 16:46   Dg Chest Port 1 View  01/29/2015  CLINICAL DATA:  Check pacer placement EXAM: PORTABLE CHEST 1 VIEW COMPARISON:  01/27/2015 FINDINGS: There is a left chest wall pacer device with lead in the projection of the right ventricle. Heart size is enlarged. Previous median sternotomy and CABG procedure. Mild pulmonary edema is noted. IMPRESSION: 1. The left chest wall pacer device is noted with lead the right ventricle. 2. Mild CHF. Electronically Signed   By: Kerby Moors M.D.   On: 01/29/2015 08:46   Dg Chest Port 1 View  01/27/2015  CLINICAL DATA:  Sepsis, acute respiratory failure, history of previous CVA. EXAM: PORTABLE CHEST 1 VIEW COMPARISON:  Portable chest x-ray of October 27, 2015 FINDINGS: Patient is is mildly rotated to the left on today's study. The lungs are adequately inflated. There is persistent increased density at the left lung base with partial obscuration of the hemidiaphragm. The cardiac silhouette remains enlarged. The pulmonary vascularity is not engorged. The permanent pacemaker is in reasonable position. There are 7 intact sternal wires from previous CABG. IMPRESSION: Stable appearance of the chest since yesterday's study. There is mild bibasilar subsegmental atelectasis greatest on the left. Electronically Signed   By: David  Martinique M.D.   On: 01/27/2015 07:19   Dg Chest Port 1 View  01/26/2015  CLINICAL DATA:  Fever EXAM: PORTABLE CHEST 1 VIEW COMPARISON:  12/13/2014 FINDINGS: Cardiomegaly again noted. Study is limited by patient's large body habitus. Status post CABG. Dual lead cardiac pacemaker is unchanged in position. No pulmonary edema. Bilateral basilar atelectasis. No segmental infiltrates. Lung bases are partially obscured by patient's upper abdomen. IMPRESSION: Limited exam by patient's large body habitus. No gross infiltrate or pulmonary edema. Bilateral basilar atelectasis. Dual lead cardiac pacemaker is unchanged in position.  Electronically Signed   By: Lahoma Crocker M.D.   On: 01/26/2015 11:39    ASSESSMENT AND PLAN  1. Complete heart block, s/p PPM insertion 2. MRSA bacteremia/PM pocket infection 3. S/p removal of pacing system and placement of a temp-perm single chamber PPM via the left IJ Rec: he is stable, post op D#2. Note ID service Dr. Hale Bogus note regarding timing of PICC line placement. I changed his bandage today and removed iodoform packing materials. He will likely heal by secondary intention. We will arrange to have him return to the office in 10 days to have his sutures removed.  Sherby Moncayo,M.D.  01/30/2015 8:10 AM

## 2015-01-31 LAB — COMPREHENSIVE METABOLIC PANEL
ALK PHOS: 58 U/L (ref 38–126)
ALT: 30 U/L (ref 17–63)
AST: 29 U/L (ref 15–41)
Albumin: 2.3 g/dL — ABNORMAL LOW (ref 3.5–5.0)
Anion gap: 12 (ref 5–15)
BUN: 11 mg/dL (ref 6–20)
CALCIUM: 9.4 mg/dL (ref 8.9–10.3)
CHLORIDE: 103 mmol/L (ref 101–111)
CO2: 26 mmol/L (ref 22–32)
CREATININE: 0.93 mg/dL (ref 0.61–1.24)
GFR calc Af Amer: >60 mL/min (ref >60)
Glucose, Bld: 224 mg/dL — ABNORMAL HIGH (ref 65–99)
Potassium: 3.4 mmol/L — ABNORMAL LOW (ref 3.5–5.1)
Sodium: 141 mmol/L (ref 135–145)
Total Bilirubin: 0.7 mg/dL (ref 0.3–1.2)
Total Protein: 5.5 g/dL — ABNORMAL LOW (ref 6.5–8.1)

## 2015-01-31 LAB — CBC
HEMATOCRIT: 37.9 % — AB (ref 39.0–52.0)
HEMOGLOBIN: 13.2 g/dL (ref 13.0–17.0)
MCH: 31.3 pg (ref 26.0–34.0)
MCHC: 34.8 g/dL (ref 30.0–36.0)
MCV: 89.8 fL (ref 78.0–100.0)
PLATELETS: 128 10*3/uL — AB (ref 150–400)
RBC: 4.22 MIL/uL (ref 4.22–5.81)
RDW: 13.1 % (ref 11.5–15.5)
WBC: 5.2 10*3/uL (ref 4.0–10.5)

## 2015-01-31 LAB — VANCOMYCIN, TROUGH: VANCOMYCIN TR: 11 ug/mL (ref 10.0–20.0)

## 2015-01-31 LAB — GLUCOSE, CAPILLARY
GLUCOSE-CAPILLARY: 234 mg/dL — AB (ref 65–99)
GLUCOSE-CAPILLARY: 231 mg/dL — AB (ref 65–99)
GLUCOSE-CAPILLARY: 223 mg/dL — AB (ref 65–99)
Glucose-Capillary: 185 mg/dL — ABNORMAL HIGH (ref 65–99)

## 2015-01-31 MED ORDER — VANCOMYCIN HCL 10 G IV SOLR
1500.0000 mg | INTRAVENOUS | Status: AC
  Administered 2015-01-31: 1500 mg via INTRAVENOUS
  Filled 2015-01-31: qty 1500

## 2015-01-31 MED ORDER — VANCOMYCIN HCL 10 G IV SOLR
1500.0000 mg | Freq: Two times a day (BID) | INTRAVENOUS | Status: DC
  Administered 2015-01-31 – 2015-02-01 (×2): 1500 mg via INTRAVENOUS
  Filled 2015-01-31 (×3): qty 1500

## 2015-01-31 NOTE — Progress Notes (Signed)
Patient ID: MILEZ LAGUEUX, male   DOB: 06-Jul-1940, 75 y.o.   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  CURRENT MEDS . antiseptic oral rinse  7 mL Mouth Rinse BID  . aspirin  81 mg Oral QHS  . clonazePAM  0.5 mg Oral QHS  . doxazosin  1 mg Oral QHS  . gabapentin  100 mg Oral QHS  . heparin subcutaneous  5,000 Units Subcutaneous 3 times per day  . insulin aspart  0-15 Units Subcutaneous TID WC  . insulin aspart  0-5 Units Subcutaneous QHS  . insulin glargine  40 Units Subcutaneous BID  . lamoTRIgine  100 mg Oral Daily  . mupirocin ointment  1 application Nasal BID  . sodium chloride  3 mL Intravenous Q12H  . traZODone  50 mg Oral QHS  . vancomycin  1,500 mg Intravenous Q12H  . Vitamin D (Ergocalciferol)  50,000 Units Oral Q Fri    OBJECTIVE  Filed Vitals:   01/30/15 2304 01/31/15 0321 01/31/15 0400 01/31/15 0726  BP: 130/94 160/88 132/94 131/75  Pulse:      Temp: 98.3 F (36.8 C) 98.6 F (37 C)  98.4 F (36.9 C)  TempSrc: Oral Oral  Oral  Resp: 27 20  17   Height:      Weight:      SpO2: 97% 92% 94% 95%    Intake/Output Summary (Last 24 hours) at 01/31/15 0849 Last data filed at 01/31/15 0500  Gross per 24 hour  Intake   1270 ml  Output    350 ml  Net    920 ml   Filed Weights   01/26/15 1214 01/26/15 2030 01/28/15 1800  Weight: 235 lb (106.595 kg) 239 lb 6.7 oz (108.6 kg) 243 lb 2.7 oz (110.3 kg)    PHYSICAL EXAM  General: Pleasant, NAD. Neuro:  Alert and oriented X 3. Moves all extremities spontaneously. Psych: Normal affect. HEENT:  Normal  Neck: Supple without bruits or JVD. Lungs:  Resp regular and unlabored, CTA. Wound is clean and dry Heart: RRR no s3, s4, or murmurs. Abdomen: Soft, non-tender, non-distended, BS + x 4.  Extremities: No clubbing, cyanosis or edema. DP/PT/Radials 2+ and equal bilaterally.  Accessory Clinical Findings  CBC  Recent Labs  01/30/15 0252 01/31/15 0305  WBC 4.6 5.2  HGB 12.3* 13.2  HCT 37.3* 37.9*  MCV 91.2 89.8  PLT 133* 0000000*   Basic Metabolic Panel  Recent Labs  01/30/15 0252 01/31/15 0305  NA 140 141  K 3.6 3.4*  CL 106 103  CO2 25 26  GLUCOSE 231* 224*  BUN 11 11  CREATININE 0.88 0.93  CALCIUM 8.6* 9.4   Liver Function Tests  Recent Labs  01/30/15 0252 01/31/15 0305  AST 24 29  ALT 27 30  ALKPHOS 49 58  BILITOT 0.5 0.7  PROT 5.0* 5.5*  ALBUMIN 2.0* 2.3*   No results for input(s): LIPASE, AMYLASE in the last 72 hours. Cardiac Enzymes No results for  input(s): CKTOTAL, CKMB, CKMBINDEX, TROPONINI in the last 72 hours. BNP Invalid input(s): POCBNP D-Dimer No results for input(s): DDIMER in the last 72 hours. Hemoglobin A1C No results for input(s): HGBA1C in the last 72 hours. Fasting Lipid Panel No results for input(s): CHOL, HDL, LDLCALC, TRIG, CHOLHDL, LDLDIRECT in the last 72 hours. Thyroid Function Tests No results for input(s): TSH, T4TOTAL, T3FREE, THYROIDAB in the last 72 hours.  Invalid input(s): FREET3  TELE  nsr with dysynchronous ventricular pacing  Radiology/Studies  Dg Abd 1 View  01/26/2015  CLINICAL DATA:  Fever, nausea, vomiting, altered mental status EXAM: ABDOMEN - 1 VIEW COMPARISON:  CT abdomen pelvis 01/26/2015, abdominal radiograph 10/07/2012 FINDINGS: Bones demineralized. Excreted contrast material within the renal collecting systems bilaterally, RIGHT ureter, and urinary bladder. RIGHT ureter appears mild dilated. Nonobstructive  bowel gas pattern with scattered stool throughout colon. No evidence of bowel obstruction or wall thickening. IMPRESSION: Question mild dilatation of the RIGHT ureter though no obstructing stone is identified by prior CT, question due to mild distention of urinary bladder. Nonobstructive bowel gas pattern. Electronically Signed   By: Lavonia Dana M.D.   On: 01/26/2015 17:30   Ct Head Wo Contrast  01/26/2015  CLINICAL DATA:  75 year old with altered mental status. EXAM: CT HEAD WITHOUT CONTRAST TECHNIQUE: Contiguous axial images were obtained from the base of the skull through the vertex without intravenous contrast. COMPARISON:  Head CT 10/07/2012 and 07/22/2012. FINDINGS: There is no evidence of acute intracranial hemorrhage, mass lesion, brain edema or extra-axial fluid collection. Mild generalized atrophy appears unchanged. There is patchy and confluent low-density in the periventricular white matter which appears mildly progressive. 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 Subcortical low-density posteriorly in the right frontal lobe appears progressive (image 24). No evidence of cortical based infarct. Diffuse intracranial vascular calcifications are noted. The visualized paranasal sinuses, mastoid air cells and middle ears are clear. The calvarium is intact. IMPRESSION: Mildly progressive chronic small vessel ischemic changes compared with prior CT from 2014. No acute intracranial findings. Electronically Signed   By: Richardean Sale M.D.   On: 01/26/2015 16:07   Ct Abdomen Pelvis W Contrast  01/26/2015  CLINICAL DATA:  Fever with nausea and vomiting. Altered mental status. History of BPH and atherosclerosis. EXAM: CT ABDOMEN AND PELVIS WITH CONTRAST TECHNIQUE: Multidetector CT imaging of the abdomen and pelvis was performed using the standard protocol following bolus administration of intravenous contrast. CONTRAST:  141mL OMNIPAQUE IOHEXOL 300 MG/ML  SOLN COMPARISON:  CT  01/18/2012. FINDINGS: Lower chest: Atelectasis at both lung bases is similar to the prior examination. There is mild cardiomegaly status post median sternotomy and CABG. Cardiac pacemaker noted. There is no pleural or pericardial effusion. Hepatobiliary: The hepatic density is diffusely decreased, consistent with steatosis. No focal abnormality observed. No evidence of gallstones, gallbladder wall thickening or biliary dilatation. Pancreas: Unremarkable. No pancreatic ductal dilatation or surrounding inflammatory changes. Spleen: Normal in size without focal abnormality. Adrenals/Urinary Tract: Both adrenal glands appear normal. There are low-density renal cysts bilaterally. No suspicious renal findings are demonstrated. There is high density within the renal collecting systems bilaterally on the early images, appearing to reflect early contrast excretion. Renal calculi are difficult to exclude. There is no evidence of ureteral or bladder calculus. There is no hydronephrosis. The bladder appears stable with mild chronic wall thickening. Stomach/Bowel: No evidence of bowel wall thickening, distention or surrounding inflammatory change. There is mild distal colonic diverticulosis. The appendix appears normal. Vascular/Lymphatic: There are no enlarged  abdominal or pelvic lymph nodes. There is diffuse atherosclerosis of the aorta, its branches and the iliac arteries. Reproductive: The prostate gland appears unchanged. Other: No evidence of abdominal wall mass or hernia. Musculoskeletal: No acute or significant osseous findings. IMPRESSION: 1. No acute findings or explanation for the patient's symptoms. No evidence of bowel obstruction or abscess. 2. Hepatic steatosis. 3. Extensive atherosclerosis. 4. Chronic bibasilar atelectasis and cardiomegaly. 5. Grossly stable bilateral renal cysts. Renal calculi difficult to exclude. No evidence of ureteral calculus or hydronephrosis. Electronically Signed   By: Richardean Sale  M.D.   On: 01/26/2015 16:46   Dg Chest Port 1 View  01/29/2015  CLINICAL DATA:  Check pacer placement EXAM: PORTABLE CHEST 1 VIEW COMPARISON:  01/27/2015 FINDINGS: There is a left chest wall pacer device with lead in the projection of the right ventricle. Heart size is enlarged. Previous median sternotomy and CABG procedure. Mild pulmonary edema is noted. IMPRESSION: 1. The left chest wall pacer device is noted with lead the right ventricle. 2. Mild CHF. Electronically Signed   By: Kerby Moors M.D.   On: 01/29/2015 08:46   Dg Chest Port 1 View  01/27/2015  CLINICAL DATA:  Sepsis, acute respiratory failure, history of previous CVA. EXAM: PORTABLE CHEST 1 VIEW COMPARISON:  Portable chest x-ray of October 27, 2015 FINDINGS: Patient is is mildly rotated to the left on today's study. The lungs are adequately inflated. There is persistent increased density at the left lung base with partial obscuration of the hemidiaphragm. The cardiac silhouette remains enlarged. The pulmonary vascularity is not engorged. The permanent pacemaker is in reasonable position. There are 7 intact sternal wires from previous CABG. IMPRESSION: Stable appearance of the chest since yesterday's study. There is mild bibasilar subsegmental atelectasis greatest on the left. Electronically Signed   By: David  Martinique M.D.   On: 01/27/2015 07:19   Dg Chest Port 1 View  01/26/2015  CLINICAL DATA:  Fever EXAM: PORTABLE CHEST 1 VIEW COMPARISON:  12/13/2014 FINDINGS: Cardiomegaly again noted. Study is limited by patient's large body habitus. Status post CABG. Dual lead cardiac pacemaker is unchanged in position. No pulmonary edema. Bilateral basilar atelectasis. No segmental infiltrates. Lung bases are partially obscured by patient's upper abdomen. IMPRESSION: Limited exam by patient's large body habitus. No gross infiltrate or pulmonary edema. Bilateral basilar atelectasis. Dual lead cardiac pacemaker is unchanged in position. Electronically  Signed   By: Lahoma Crocker M.D.   On: 01/26/2015 11:39    ASSESSMENT AND PLAN  1. MRSA bacteremia 2. PM pocket infection 3. Complete heart block 4. S/p extraction Rec: note plans for PICC. Would anticipate he will be ready for PPM re-insertion in approx 2 months.   Simara Rhyner,M.D.  01/31/2015 8:49 AM

## 2015-01-31 NOTE — Progress Notes (Signed)
[facility] PROGRESS NOTE  [name] [id number] DOB: 09-29-1940 DOA: 01/26/2015 PCP: Cathlean Cower, MD  Assessment/Plan: Sepsis/Staphylococcus aureus Bacteremia with pacemaker pocket infection Four of 4 admission blood cultures are growing MRSA.   repeat blood cultures  on 1/12 negative so far. Once blood cultures are negative he can have a PICC placed in his left arm tomorrow in anticipation of 6 weeks of IV vancomycin through 02/22/15. Follow-up with Dr. Michel Bickers infectious disease. pacemaker has been removed, currently has placement of a temp-perm single chamber PPM via the left IJ -CXR with mild basilar atx, UA/CT Abd/FLu PCR negative  ECHO showed EF of 35-40%, diffuse hypokinesis, EF moderately decreased Patient currently on vancomycin   for optimal coverage of  MRSA   -Discontinue continue IVF, given low EF Cardiology recommends PICC line placement in the left arm to preserve his right side for PPM insertion in approx 6 weeks return to the cardiology office in 10 days to have his sutures removed.   2. Dementia -stable at baseline -lamictal, klonopin PRN  3. Heel pressure ulcer -continue wound care, no s/s of surrounding infection  4. AKi-resolved Repeat renal function DC fluids  5. DM -CBGs in 200s, will increase lantus to 40units BID  6. Chronic pain/neuropathy -continue gabapentin/oxycodone  7. H/o CAD/ICM EF of  -continue ASA  8. 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 Dysphagia -on D3 diet now  DVt proph: Hep SQ  Code Status: DNR Family Communication: none at bedside, called and d/w wife [name] Disposition Plan: Waiting on repeat blood cultures, SNF Monday  HPI/Subjective: Resting , no cp, no sob  Objective: Filed Vitals:   01/31/15 0400 01/31/15 0726  BP: 132/94 131/75  Pulse:    Temp:  98.4 F (36.9 C)  Resp:  17    Intake/Output Summary (Last 24 hours) at 01/31/15 0834 Last data filed at  01/31/15 0500  Gross per 24 hour  Intake   1270 ml  Output    350 ml  Net    920 ml   Filed Weights   01/26/15 1214 01/26/15 2030 01/28/15 1800  Weight: 106.595 kg (235 lb) 108.6 kg (239 lb 6.7 oz) 110.3 kg (243 lb 2.7 oz)    Exam:   General:  Somnolent, arousable, answers questions, oriented to self, place only             Chest: sinus at lateral edge of pacer pocket with purulent discharge  Cardiovascular: S1S2/RRR  Respiratory: CTAB  Abdomen: soft, NT, BS present  Musculoskeletal:no edema , small pressure sores noted  Data Reviewed: Basic Metabolic Panel:  Recent Labs Lab 01/26/15 1200 01/27/15 0227 01/28/15 0540 01/30/15 0252 01/31/15 0305  NA 140 139 140 140 141  K 3.8 3.9 3.6 3.6 3.4*  CL 103 106 106 106 103  CO2 23 24 25 25 26   GLUCOSE 414* 345* 253* 231* 224*  BUN 16 17 11 11 11   CREATININE 1.26* 1.17 1.05 0.88 0.93  CALCIUM 9.0 8.0* 8.1* 8.6* 9.4   Liver Function Tests:  Recent Labs Lab 01/26/15 1200 01/30/15 0252 01/31/15 0305  AST 26 24 29   ALT 33 27 30  ALKPHOS 44 49 58  BILITOT 1.0 0.5 0.7  PROT 6.3* 5.0* 5.5*  ALBUMIN 3.1* 2.0* 2.3*    Recent Labs Lab 01/26/15 1200  LIPASE 19   No results for input(s): AMMONIA in the last 168 hours. CBC:  Recent Labs Lab 01/26/15 1200 01/27/15 0227 01/28/15  0540 01/30/15 0252 01/31/15 0305  WBC 12.1* 9.0 5.3 4.6 5.2  NEUTROABS 10.5*  --   --   --   --   HGB 15.0 12.1* 12.1* 12.3* 13.2  HCT 44.8 37.8* 37.2* 37.3* 37.9*  MCV 92.9 95.2 93.9 91.2 89.8  PLT 142* 127* 120* 133* 128*   Cardiac Enzymes: No results for input(s): CKTOTAL, CKMB, CKMBINDEX, TROPONINI in the last 168 hours. BNP (last 3 results) No results for input(s): BNP in the last 8760 hours.  ProBNP (last 3 results) No results for input(s): PROBNP in the last 8760 hours.  CBG:  Recent Labs Lab 01/30/15 0747 01/30/15 1228 01/30/15 1712 01/30/15 2135 01/31/15 0730  GLUCAP 243* 226* 247* 230* 185*    Recent  Results (from the past 240 hour(s))  Blood Culture (routine x 2)     Status: None   Collection Time: 01/26/15 11:16 AM  Result Value Ref Range Status   Specimen Description BLOOD LEFT HAND  Final   Special Requests BOTTLES DRAWN AEROBIC AND ANAEROBIC 4CC  Final   Culture  Setup Time   Final    GRAM POSITIVE COCCI IN CLUSTERS IN BOTH AEROBIC AND ANAEROBIC BOTTLES CRITICAL RESULT CALLED TO, READ BACK BY AND VERIFIED WITH: T NEILSON @ [id number] 01/27/15 MKELLYM    Culture METHICILLIN RESISTANT STAPHYLOCOCCUS AUREUS  Final   Report Status 01/29/2015 FINAL  Final   Organism ID, Bacteria METHICILLIN RESISTANT STAPHYLOCOCCUS AUREUS  Final      Susceptibility   Methicillin resistant staphylococcus aureus - MIC*    CIPROFLOXACIN >=8 RESISTANT Resistant     ERYTHROMYCIN >=8 RESISTANT Resistant     GENTAMICIN <=0.5 SENSITIVE Sensitive     OXACILLIN >=4 RESISTANT Resistant     TETRACYCLINE <=1 SENSITIVE Sensitive     VANCOMYCIN 1 SENSITIVE Sensitive     TRIMETH/SULFA <=10 SENSITIVE Sensitive     CLINDAMYCIN >=8 RESISTANT Resistant     RIFAMPIN <=0.5 SENSITIVE Sensitive     Inducible Clindamycin NEGATIVE Sensitive     * METHICILLIN RESISTANT STAPHYLOCOCCUS AUREUS  Blood Culture (routine x 2)     Status: None   Collection Time: 01/26/15 11:55 AM  Result Value Ref Range Status   Specimen Description BLOOD LEFT ANTECUBITAL  Final   Special Requests BOTTLES DRAWN AEROBIC AND ANAEROBIC 5CC  Final   Culture  Setup Time   Final    GRAM POSITIVE COCCI IN CLUSTERS IN BOTH AEROBIC AND ANAEROBIC BOTTLES CRITICAL RESULT CALLED TO, READ BACK BY AND VERIFIED WITH: T NEILSON 0332 01/27/15 MKELLY    Culture   Final    STAPHYLOCOCCUS AUREUS SUSCEPTIBILITIES PERFORMED ON PREVIOUS CULTURE WITHIN THE LAST 5 DAYS.    Report Status 01/29/2015 FINAL  Final  Urine culture     Status: None   Collection Time: 01/26/15  2:47 PM  Result Value Ref Range Status   Specimen Description URINE, RANDOM  Final   Special  Requests NONE  Final   Culture NO GROWTH 1 DAY  Final   Report Status 01/27/2015 FINAL  Final  MRSA PCR Screening     Status: Abnormal   Collection Time: 01/26/15 10:20 PM  Result Value Ref Range Status   MRSA by PCR POSITIVE (A) NEGATIVE Final    Comment:        The GeneXpert MRSA Assay (FDA approved for NASAL specimens only), is one component of a comprehensive MRSA colonization surveillance program. It is not intended to diagnose MRSA infection nor to guide or monitor treatment for  MRSA infections. RESULT CALLED TO, READ BACK BY AND VERIFIED WITH: T WILSON,RN @0134  01/27/15 MKELLY   Culture, blood (routine x 2)     Status: None   Collection Time: 01/27/15  4:42 PM  Result Value Ref Range Status   Specimen Description BLOOD RIGHT HAND  Final   Special Requests IN PEDIATRIC BOTTLE 3CC  Final   Culture  Setup Time   Final    GRAM POSITIVE COCCI IN CLUSTERS PEDIATRIC BOTTLE CRITICAL RESULT CALLED TO, READ BACK BY AND VERIFIED WITH: A AGUIRRE,RN AT 1136 01/28/15 BY L BENFIELD    Culture   Final    STAPHYLOCOCCUS AUREUS SUSCEPTIBILITIES PERFORMED ON PREVIOUS CULTURE WITHIN THE LAST 5 DAYS.    Report Status 01/29/2015 FINAL  Final  Culture, blood (routine x 2)     Status: None   Collection Time: 01/27/15  4:44 PM  Result Value Ref Range Status   Specimen Description BLOOD LEFT HAND  Final   Special Requests IN PEDIATRIC BOTTLE 2CC  Final   Culture  Setup Time   Final    GRAM POSITIVE COCCI IN CLUSTERS PEDIATRIC BOTTLE CRITICAL RESULT CALLED TO, READ BACK BY AND VERIFIED WITH: A AGUIRRE,RN AT 1136 01/28/15 BY L BENFIELD    Culture   Final    STAPHYLOCOCCUS AUREUS SUSCEPTIBILITIES PERFORMED ON PREVIOUS CULTURE WITHIN THE LAST 5 DAYS.    Report Status 01/29/2015 FINAL  Final  Culture, blood (routine x 2)     Status: None (Preliminary result)   Collection Time: 01/29/15 10:30 AM  Result Value Ref Range Status   Specimen Description BLOOD LEFT HAND  Final   Special Requests  BOTTLES DRAWN AEROBIC ONLY 0.5CC  Final   Culture NO GROWTH 1 DAY  Final   Report Status PENDING  Incomplete  Culture, blood (routine x 2)     Status: None (Preliminary result)   Collection Time: 01/29/15 10:41 AM  Result Value Ref Range Status   Specimen Description BLOOD RIGHT HAND  Final   Special Requests BOTTLES DRAWN AEROBIC ONLY 0.5CC  Final   Culture NO GROWTH 1 DAY  Final   Report Status PENDING  Incomplete     Studies: Dg Chest Port 1 View  01/29/2015  CLINICAL DATA:  Check pacer placement EXAM: PORTABLE CHEST 1 VIEW COMPARISON:  01/27/2015 FINDINGS: There is a left chest wall pacer device with lead in the projection of the right ventricle. Heart size is enlarged. Previous median sternotomy and CABG procedure. Mild pulmonary edema is noted. IMPRESSION: 1. The left chest wall pacer device is noted with lead the right ventricle. 2. Mild CHF. Electronically Signed   By: Kerby Moors M.D.   On: 01/29/2015 08:46    Scheduled Meds: . antiseptic oral rinse  7 mL Mouth Rinse BID  . aspirin  81 mg Oral QHS  . clonazePAM  0.5 mg Oral QHS  . doxazosin  1 mg Oral QHS  . gabapentin  100 mg Oral QHS  . heparin subcutaneous  5,000 Units Subcutaneous 3 times per day  . insulin aspart  0-15 Units Subcutaneous TID WC  . insulin aspart  0-5 Units Subcutaneous QHS  . insulin glargine  40 Units Subcutaneous BID  . lamoTRIgine  100 mg Oral Daily  . mupirocin ointment  1 application Nasal BID  . sodium chloride  3 mL Intravenous Q12H  . traZODone  50 mg Oral QHS  . vancomycin  1,500 mg Intravenous Q12H  . Vitamin D (Ergocalciferol)  50,000 Units Oral  Q Fri   Continuous Infusions:   Antibiotics Given (last 72 hours)    Date/Time Action Medication Dose Rate   01/28/15 1637 Given   vancomycin (VANCOCIN) 1,500 mg in sodium chloride 0.9 % 500 mL IVPB 1,500 mg 250 mL/hr   01/28/15 1723 Given   gentamicin (GARAMYCIN) 80 mg in sodium chloride irrigation 0.9 % 500 mL irrigation 80 mg     01/29/15 0013 Given   ceFAZolin (ANCEF) IVPB 2 g/50 mL premix 2 g 100 mL/hr   01/29/15 0406 Given   vancomycin (VANCOCIN) IVPB 1000 mg/200 mL premix 1,000 mg 200 mL/hr   01/29/15 1646 Given   vancomycin (VANCOCIN) IVPB 1000 mg/200 mL premix 1,000 mg 200 mL/hr   01/30/15 0404 Given   vancomycin (VANCOCIN) IVPB 1000 mg/200 mL premix 1,000 mg 200 mL/hr   01/30/15 1507 Given   vancomycin (VANCOCIN) IVPB 1000 mg/200 mL premix 1,000 mg 200 mL/hr   01/31/15 0455 Given   vancomycin (VANCOCIN) 1,500 mg in sodium chloride 0.9 % 500 mL IVPB 1,500 mg 250 mL/hr      Principal Problem:   Staphylococcus aureus bacteremia with sepsis (HCC) Active Problems:   DM (diabetes mellitus), type 2 with peripheral vascular complications (HCC)   CAD, ARTERY BYPASS GRAFT   BPH (benign prostatic hypertrophy)   Bipolar affective disorder ([place])   CVA (cerebral infarction)   Acute respiratory failure ([place])   AKI (acute kidney injury) ([place])   Acute encephalopathy   Infection of pacemaker pocket [facility])    Time spent: 67min    Donnalyn Juran  [facility] Pager [phone]. If 7PM-7AM, please contact night-coverage at [email or web address], password [facility] 01/31/2015, 8:34 AM  LOS: 5 days

## 2015-01-31 NOTE — Progress Notes (Signed)
Pharmacy Antibiotic Follow-up Note  [name] is a 75 y.o. year-old male admitted on 01/26/2015.  The patient is currently on day 6 of vancomycin for MRSA bacteremia and pacer pocket infection.  Assessment/Plan: Vanc trough of 11 subtherapeutic.  Will increase vancomycin to 1500mg  IV Q12H for calculated trough ~17; plan for tx duration of 6wk.  Temp (24hrs), Avg:98.4 F (36.9 C), Min:98.3 F (36.8 C), Max:98.6 F (37 C)   Recent Labs Lab 01/26/15 1200 01/27/15 0227 01/28/15 0540 01/30/15 0252 01/31/15 0305  WBC 12.1* 9.0 5.3 4.6 5.2    Recent Labs Lab 01/26/15 1200 01/27/15 0227 01/28/15 0540 01/30/15 0252  CREATININE 1.26* 1.17 1.05 0.88   Estimated Creatinine Clearance: 86.8 mL/min (by C-G formula based on Cr of 0.88).    Allergies  Allergen Reactions  . Dexedrine [Dextroamphetamine Sulfate Er] Other (See Comments)    agitation  . Oxycodone Nausea And Vomiting    Thank you for allowing pharmacy to be a part of this patient's care.  Wynona Neat, PharmD, BCPS   01/31/2015 4:02 AM

## 2015-02-01 DIAGNOSIS — E1151 Type 2 diabetes mellitus with diabetic peripheral angiopathy without gangrene: Secondary | ICD-10-CM

## 2015-02-01 DIAGNOSIS — G9340 Encephalopathy, unspecified: Secondary | ICD-10-CM

## 2015-02-01 DIAGNOSIS — F3175 Bipolar disorder, in partial remission, most recent episode depressed: Secondary | ICD-10-CM

## 2015-02-01 DIAGNOSIS — N179 Acute kidney failure, unspecified: Secondary | ICD-10-CM

## 2015-02-01 LAB — COMPREHENSIVE METABOLIC PANEL
ALBUMIN: 2.2 g/dL — AB (ref 3.5–5.0)
ALK PHOS: 56 U/L (ref 38–126)
ALT: 30 U/L (ref 17–63)
ANION GAP: 8 (ref 5–15)
AST: 27 U/L (ref 15–41)
BUN: 13 mg/dL (ref 6–20)
CALCIUM: 9.4 mg/dL (ref 8.9–10.3)
CO2: 29 mmol/L (ref 22–32)
Chloride: 103 mmol/L (ref 101–111)
Creatinine, Ser: 0.97 mg/dL (ref 0.61–1.24)
GFR calc Af Amer: >60 mL/min (ref >60)
GFR calc non Af Amer: >60 mL/min (ref >60)
GLUCOSE: 209 mg/dL — AB (ref 65–99)
Potassium: 3.3 mmol/L — ABNORMAL LOW (ref 3.5–5.1)
SODIUM: 140 mmol/L (ref 135–145)
Total Bilirubin: 0.4 mg/dL (ref 0.3–1.2)
Total Protein: 5.8 g/dL — ABNORMAL LOW (ref 6.5–8.1)

## 2015-02-01 LAB — CBC
HCT: 39.1 % (ref 39.0–52.0)
HEMOGLOBIN: 13.6 g/dL (ref 13.0–17.0)
MCH: 31.4 pg (ref 26.0–34.0)
MCHC: 34.8 g/dL (ref 30.0–36.0)
MCV: 90.3 fL (ref 78.0–100.0)
Platelets: 204 10*3/uL (ref 150–400)
RBC: 4.33 MIL/uL (ref 4.22–5.81)
RDW: 13.3 % (ref 11.5–15.5)
WBC: 6.4 10*3/uL (ref 4.0–10.5)

## 2015-02-01 LAB — GLUCOSE, CAPILLARY
GLUCOSE-CAPILLARY: 233 mg/dL — AB (ref 65–99)
GLUCOSE-CAPILLARY: 218 mg/dL — AB (ref 65–99)
Glucose-Capillary: 204 mg/dL — ABNORMAL HIGH (ref 65–99)
Glucose-Capillary: 258 mg/dL — ABNORMAL HIGH (ref 65–99)

## 2015-02-01 MED ORDER — POTASSIUM CHLORIDE CRYS ER 20 MEQ PO TBCR
40.0000 meq | EXTENDED_RELEASE_TABLET | Freq: Once | ORAL | Status: AC
  Administered 2015-02-01: 40 meq via ORAL
  Filled 2015-02-01: qty 2

## 2015-02-01 MED ORDER — SODIUM CHLORIDE 0.9 % IV SOLN
1500.0000 mg | Freq: Two times a day (BID) | INTRAVENOUS | Status: DC
  Administered 2015-02-01 – 2015-02-02 (×3): 1500 mg via INTRAVENOUS
  Filled 2015-02-01 (×4): qty 1500

## 2015-02-01 NOTE — Progress Notes (Signed)
PATIENT DETAILS Name: Zohaib Craft [name] Age: 75 y.o. Sex: male Date of Birth: Feb 15, 1940 Admit Date: 01/26/2015 Admitting Physician Waldemar Dickens, MD [id number] Jenny Reichmann, MD  Subjective: No major complaints-awaiting a PICC line placement  Assessment/Plan: MRSA bacteremia with sepsis: Sepsis pathophysiology has resolved. 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 Underwent pacemaker extraction, and insertion of a temp-perm pacemaker on 01/28/15. Transthoracic echocardiogram negative for vegetations. Evaluated by infectious disease, recommendations are to continue vancomycin through 02/22/2015. Repeat blood cultures on 01/29/15 negative-awaiting PICC line placement. Plans are to discharge to SNF on 02/02/15.  Acute encephalopathy: Secondary to above, resolved-back to baseline. CT head on 1/9 negative.  AKI: Resolved, likely prerenal azotemia from sepsis  Hypokalemia: Replete and recheck  History of Mobitz 2 AV block: Permanent makeup explanted on 1/11 due to MRSA bacteremia with pacemaker pocket temp-perm pacemaker in place, plans are to reimplant pacemaker once patient has cleared his infection.  Will need close outpatient follow-up with cardiology  History of CAD-status post CABG in 2010: Currently without any chest pain or shortness of breath. Appears stable.  History of chronic systolic heart failure: Transthoracic echo on 01/29/15 showed EF around 35-40%. Clinically compensated.continue with aspirin.  Insulin-dependent diabetes: CBGs relatively stable with 40 units of Lantus twice a day and SSI. We will continue to monitor for another day before adjusting further  Hypertension: Controlled with Cardura  History of peripheral neuropathy/chronic pain syndrome: Continue Neurontin, narcotics  Bipolar disorder:Stable- Continue trazodone, Lamictal and as needed Klonopin  History of dementia: Currently very pleasant, stable and appears at baseline.    Disposition: Remain inpatient  Antimicrobial agents  See below  Anti-infectives    Start     Dose/Rate Route Frequency Ordered Stop   01/31/15 1600  vancomycin (VANCOCIN) 1,500 mg in sodium chloride 0.9 % 500 mL IVPB     1,500 mg 250 mL/hr over 120 Minutes Intravenous Every 12 hours 01/31/15 0402     01/31/15 0415  vancomycin (VANCOCIN) 1,500 mg in sodium chloride 0.9 % 500 mL IVPB     1,500 mg 250 mL/hr over 120 Minutes Intravenous NOW 01/31/15 0402 01/31/15 0655   01/29/15 0400  vancomycin (VANCOCIN) IVPB 1000 mg/200 mL premix  Status:  Discontinued     1,000 mg 200 mL/hr over 60 Minutes Intravenous Every 12 hours 01/28/15 1900 01/31/15 0359   01/29/15 0300  vancomycin (VANCOCIN) 1,500 mg in sodium chloride 0.9 % 500 mL IVPB  Status:  Discontinued     1,500 mg 250 mL/hr over 120 Minutes Intravenous Every 12 hours 01/28/15 1847 01/28/15 1858   01/28/15 1400  ceFAZolin (ANCEF) IVPB 2 g/50 mL premix  Status:  Discontinued     2 g 100 mL/hr over 30 Minutes Intravenous 3 times per day 01/28/15 1028 01/29/15 0808   01/28/15 1230  gentamicin (GARAMYCIN) 80 mg in sodium chloride irrigation 0.9 % 500 mL irrigation     80 mg Irrigation To Cath Lab 01/28/15 1223 01/28/15 1723   01/28/15 1230  vancomycin (VANCOCIN) 1,500 mg in sodium chloride 0.9 % 500 mL IVPB     1,500 mg 250 mL/hr over 120 Minutes Intravenous To Cath Lab 01/28/15 1223 01/28/15 1837   01/28/15 0000  vancomycin (VANCOCIN) IVPB 1000 mg/200 mL premix  Status:  Discontinued     1,000 mg 200 mL/hr over 60 Minutes Intravenous Every 12 hours 01/27/15 1320 01/28/15 1900   01/27/15 1400  vancomycin (VANCOCIN)  1,250 mg in sodium chloride 0.9 % 250 mL IVPB  Status:  Discontinued     1,250 mg 166.7 mL/hr over 90 Minutes Intravenous Every 24 hours 01/26/15 1307 01/27/15 1334   01/26/15 1800  piperacillin-tazobactam (ZOSYN) IVPB 3.375 g  Status:  Discontinued     3.375 g 12.5 mL/hr over 240 Minutes Intravenous 3 times per day  01/26/15 1307 01/28/15 1028   01/26/15 1300  vancomycin (VANCOCIN) IVPB 1000 mg/200 mL premix  Status:  Discontinued     1,000 mg 200 mL/hr over 60 Minutes Intravenous  Once 01/26/15 1208 01/28/15 1900   01/26/15 1145  vancomycin (VANCOCIN) IVPB 1000 mg/200 mL premix     1,000 mg 200 mL/hr over 60 Minutes Intravenous  Once 01/26/15 1140 01/26/15 1423   01/26/15 1145  piperacillin-tazobactam (ZOSYN) IVPB 3.375 g     3.375 g 100 mL/hr over 30 Minutes Intravenous  Once 01/26/15 1140 01/26/15 1237      DVT Prophylaxis: Prophylactic Heparin   Code Status:  DNR  Family Communication None at bedside  Procedures: Pacemaker extraction, placement of temporary pacemaker- on 1/11  CONSULTS:  cardiology and ID  Time spent 30 minutes-Greater than 50% of this time was spent in counseling, explanation of diagnosis, planning of further management, and coordination of care.  MEDICATIONS: Scheduled Meds: . antiseptic oral rinse  7 mL Mouth Rinse BID  . aspirin  81 mg Oral QHS  . clonazePAM  0.5 mg Oral QHS  . doxazosin  1 mg Oral QHS  . gabapentin  100 mg Oral QHS  . heparin subcutaneous  5,000 Units Subcutaneous 3 times per day  . insulin aspart  0-15 Units Subcutaneous TID WC  . insulin aspart  0-5 Units Subcutaneous QHS  . insulin glargine  40 Units Subcutaneous BID  . lamoTRIgine  100 mg Oral Daily  . potassium chloride  40 mEq Oral Once  . sodium chloride  3 mL Intravenous Q12H  . traZODone  50 mg Oral QHS  . vancomycin  1,500 mg Intravenous Q12H  . Vitamin D (Ergocalciferol)  50,000 Units Oral Q Fri   Continuous Infusions:  PRN Meds:.acetaminophen **OR** acetaminophen, clonazePAM, docusate sodium, guaifenesin, HYDROcodone-acetaminophen, loperamide, nitroGLYCERIN, ondansetron **OR** ondansetron (ZOFRAN) IV, traMADol    PHYSICAL EXAM: Vital signs in last 24 hours: Filed Vitals:   02/01/15 0500 02/01/15 0600 02/01/15 0737 02/01/15 1132  BP:   144/88 124/90  Pulse:       Temp:   98.4 F (36.9 C) 98.4 F (36.9 C)  TempSrc:   Oral Oral  Resp: 22 21 21 21   Height:      Weight:      SpO2:   92% 91%    Weight change:  Filed Weights   01/26/15 1214 01/26/15 2030 01/28/15 1800  Weight: 106.595 kg (235 lb) 108.6 kg (239 lb 6.7 oz) 110.3 kg (243 lb 2.7 oz)   Body mass index is 36.43 kg/(m^2).   Gen Exam: Awake and alert with clear speech.   Neck: Supple, No JVD.   Chest: B/L Clear.   CVS: S1 S2 Regular, no murmurs.  Abdomen: soft, BS +, non tender, non distended.  Extremities: no edema, lower extremities warm to touch. Neurologic: Non Focal.   Skin: No Rash.   Wounds: N/A.    Intake/Output from previous day:  Intake/Output Summary (Last 24 hours) at 02/01/15 1141 Last data filed at 02/01/15 0609  Gross per 24 hour  Intake   1500 ml  Output  0 ml  Net   1500 ml     LAB RESULTS: CBC  Recent Labs Lab 01/26/15 1200 01/27/15 0227 01/28/15 0540 01/30/15 0252 01/31/15 0305 02/01/15 0245  WBC 12.1* 9.0 5.3 4.6 5.2 6.4  HGB 15.0 12.1* 12.1* 12.3* 13.2 13.6  HCT 44.8 37.8* 37.2* 37.3* 37.9* 39.1  PLT 142* 127* 120* 133* 128* 204  MCV 92.9 95.2 93.9 91.2 89.8 90.3  MCH 31.1 30.5 30.6 30.1 31.3 31.4  MCHC 33.5 32.0 32.5 33.0 34.8 34.8  RDW 13.7 14.1 13.7 13.4 13.1 13.3  LYMPHSABS 0.7  --   --   --   --   --   MONOABS 0.9  --   --   --   --   --   EOSABS 0.0  --   --   --   --   --   BASOSABS 0.0  --   --   --   --   --     Chemistries   Recent Labs Lab 01/27/15 0227 01/28/15 0540 01/30/15 0252 01/31/15 0305 02/01/15 0245  NA 139 140 140 141 140  K 3.9 3.6 3.6 3.4* 3.3*  CL 106 106 106 103 103  CO2 24 25 25 26 29   GLUCOSE 345* 253* 231* 224* 209*  BUN 17 11 11 11 13   CREATININE 1.17 1.05 0.88 0.93 0.97  CALCIUM 8.0* 8.1* 8.6* 9.4 9.4    CBG:  Recent Labs Lab 01/31/15 0730 01/31/15 1145 01/31/15 1804 01/31/15 2110 02/01/15 0740  GLUCAP 185* 234* 231* 223* 204*    GFR Estimated Creatinine Clearance: 78.7  mL/min (by C-G formula based on Cr of 0.97).  Coagulation profile  Recent Labs Lab 01/26/15 1740  INR 1.14    Cardiac Enzymes No results for input(s): CKMB, TROPONINI, MYOGLOBIN in the last 168 hours.  Invalid input(s): CK  Invalid input(s): POCBNP No results for input(s): DDIMER in the last 72 hours. No results for input(s): HGBA1C in the last 72 hours. No results for input(s): CHOL, HDL, LDLCALC, TRIG, CHOLHDL, LDLDIRECT in the last 72 hours. No results for input(s): TSH, T4TOTAL, T3FREE, THYROIDAB in the last 72 hours.  Invalid input(s): FREET3 No results for input(s): VITAMINB12, FOLATE, FERRITIN, TIBC, IRON, RETICCTPCT in the last 72 hours. No results for input(s): LIPASE, AMYLASE in the last 72 hours.  Urine Studies No results for input(s): UHGB, CRYS in the last 72 hours.  Invalid input(s): UACOL, UAPR, USPG, UPH, UTP, UGL, UKET, UBIL, UNIT, UROB, ULEU, UEPI, UWBC, URBC, UBAC, CAST, UCOM, BILUA  MICROBIOLOGY: Recent Results (from the past 240 hour(s))  Blood Culture (routine x 2)     Status: None   Collection Time: 01/26/15 11:16 AM  Result Value Ref Range Status   Specimen Description BLOOD LEFT HAND  Final   Special Requests BOTTLES DRAWN AEROBIC AND ANAEROBIC 4CC  Final   Culture  Setup Time   Final    GRAM POSITIVE COCCI IN CLUSTERS IN BOTH AEROBIC AND ANAEROBIC BOTTLES CRITICAL RESULT CALLED TO, READ BACK BY AND VERIFIED WITH: T NEILSON @ [id number] 01/27/15 MKELLYM    Culture METHICILLIN RESISTANT STAPHYLOCOCCUS AUREUS  Final   Report Status 01/29/2015 FINAL  Final   Organism ID, Bacteria METHICILLIN RESISTANT STAPHYLOCOCCUS AUREUS  Final      Susceptibility   Methicillin resistant staphylococcus aureus - MIC*    CIPROFLOXACIN >=8 RESISTANT Resistant     ERYTHROMYCIN >=8 RESISTANT Resistant     GENTAMICIN <=0.5 SENSITIVE Sensitive     OXACILLIN >=  4 RESISTANT Resistant     TETRACYCLINE <=1 SENSITIVE Sensitive     VANCOMYCIN 1 SENSITIVE Sensitive      TRIMETH/SULFA <=10 SENSITIVE Sensitive     CLINDAMYCIN >=8 RESISTANT Resistant     RIFAMPIN <=0.5 SENSITIVE Sensitive     Inducible Clindamycin NEGATIVE Sensitive     * METHICILLIN RESISTANT STAPHYLOCOCCUS AUREUS  Blood Culture (routine x 2)     Status: None   Collection Time: 01/26/15 11:55 AM  Result Value Ref Range Status   Specimen Description BLOOD LEFT ANTECUBITAL  Final   Special Requests BOTTLES DRAWN AEROBIC AND ANAEROBIC 5CC  Final   Culture  Setup Time   Final    GRAM POSITIVE COCCI IN CLUSTERS IN BOTH AEROBIC AND ANAEROBIC BOTTLES CRITICAL RESULT CALLED TO, READ BACK BY AND VERIFIED WITH: T NEILSON 0332 01/27/15 MKELLY    Culture   Final    STAPHYLOCOCCUS AUREUS SUSCEPTIBILITIES PERFORMED ON PREVIOUS CULTURE WITHIN THE LAST 5 DAYS.    Report Status 01/29/2015 FINAL  Final  Urine culture     Status: None   Collection Time: 01/26/15  2:47 PM  Result Value Ref Range Status   Specimen Description URINE, RANDOM  Final   Special Requests NONE  Final   Culture NO GROWTH 1 DAY  Final   Report Status 01/27/2015 FINAL  Final  MRSA PCR Screening     Status: Abnormal   Collection Time: 01/26/15 10:20 PM  Result Value Ref Range Status   MRSA by PCR POSITIVE (A) NEGATIVE Final    Comment:        The GeneXpert MRSA Assay (FDA approved for NASAL specimens only), is one component of a comprehensive MRSA colonization surveillance program. It is not intended to diagnose MRSA infection nor to guide or monitor treatment for MRSA infections. RESULT CALLED TO, READ BACK BY AND VERIFIED WITH: T WILSON,RN @0134  01/27/15 MKELLY   Culture, blood (routine x 2)     Status: None   Collection Time: 01/27/15  4:42 PM  Result Value Ref Range Status   Specimen Description BLOOD RIGHT HAND  Final   Special Requests IN PEDIATRIC BOTTLE 3CC  Final   Culture  Setup Time   Final    GRAM POSITIVE COCCI IN CLUSTERS PEDIATRIC BOTTLE CRITICAL RESULT CALLED TO, READ BACK BY AND VERIFIED WITH: A  AGUIRRE,RN AT 1136 01/28/15 BY L BENFIELD    Culture   Final    STAPHYLOCOCCUS AUREUS SUSCEPTIBILITIES PERFORMED ON PREVIOUS CULTURE WITHIN THE LAST 5 DAYS.    Report Status 01/29/2015 FINAL  Final  Culture, blood (routine x 2)     Status: None   Collection Time: 01/27/15  4:44 PM  Result Value Ref Range Status   Specimen Description BLOOD LEFT HAND  Final   Special Requests IN PEDIATRIC BOTTLE 2CC  Final   Culture  Setup Time   Final    GRAM POSITIVE COCCI IN CLUSTERS PEDIATRIC BOTTLE CRITICAL RESULT CALLED TO, READ BACK BY AND VERIFIED WITH: A AGUIRRE,RN AT 1136 01/28/15 BY L BENFIELD    Culture   Final    STAPHYLOCOCCUS AUREUS SUSCEPTIBILITIES PERFORMED ON PREVIOUS CULTURE WITHIN THE LAST 5 DAYS.    Report Status 01/29/2015 FINAL  Final  Culture, blood (routine x 2)     Status: None (Preliminary result)   Collection Time: 01/29/15 10:30 AM  Result Value Ref Range Status   Specimen Description BLOOD LEFT HAND  Final   Special Requests BOTTLES DRAWN AEROBIC ONLY 0.5CC  Final  Culture NO GROWTH 2 DAYS  Final   Report Status PENDING  Incomplete  Culture, blood (routine x 2)     Status: None (Preliminary result)   Collection Time: 01/29/15 10:41 AM  Result Value Ref Range Status   Specimen Description BLOOD RIGHT HAND  Final   Special Requests BOTTLES DRAWN AEROBIC ONLY 0.5CC  Final   Culture NO GROWTH 2 DAYS  Final   Report Status PENDING  Incomplete    RADIOLOGY STUDIES/RESULTS: Dg Abd 1 View  01/26/2015  CLINICAL DATA:  Fever, nausea, vomiting, altered mental status EXAM: ABDOMEN - 1 VIEW COMPARISON:  CT abdomen pelvis 01/26/2015, abdominal radiograph 10/07/2012 FINDINGS: Bones demineralized. Excreted contrast material within the renal collecting systems bilaterally, RIGHT ureter, and urinary bladder. RIGHT ureter appears mild dilated. Nonobstructive bowel gas pattern with scattered stool throughout colon. No evidence of bowel obstruction or wall thickening. IMPRESSION:  Question mild dilatation of the RIGHT ureter though no obstructing stone is identified by prior CT, question due to mild distention of urinary bladder. Nonobstructive bowel gas pattern. Electronically Signed   By: Lavonia Dana M.D.   On: 01/26/2015 17:30   Ct Head Wo Contrast  01/26/2015  CLINICAL DATA:  75 year old with altered mental status. EXAM: CT HEAD WITHOUT CONTRAST TECHNIQUE: Contiguous axial images were obtained from the base of the skull through the vertex without intravenous contrast. COMPARISON:  Head CT 10/07/2012 and 07/22/2012. FINDINGS: There is no evidence of acute intracranial hemorrhage, mass lesion, brain edema or extra-axial fluid collection. Mild generalized atrophy appears unchanged. There is patchy and confluent low-density in the periventricular white matter which appears mildly progressive. 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 Subcortical low-density posteriorly in the right frontal lobe appears progressive (image 24). No evidence of cortical based infarct. Diffuse intracranial vascular calcifications are noted. The visualized paranasal sinuses, mastoid air cells and middle ears are clear. The calvarium is intact. IMPRESSION: Mildly progressive chronic small vessel ischemic changes compared with prior CT from 2014. No acute intracranial findings. Electronically Signed   By: Richardean Sale M.D.   On: 01/26/2015 16:07   Ct Abdomen Pelvis W Contrast  01/26/2015  CLINICAL DATA:  Fever with nausea and vomiting. Altered mental status. History of BPH and atherosclerosis. EXAM: CT ABDOMEN AND PELVIS WITH CONTRAST TECHNIQUE: Multidetector CT imaging of the abdomen and pelvis was performed using the standard protocol following bolus administration of intravenous contrast. CONTRAST:  181mL OMNIPAQUE IOHEXOL 300 MG/ML  SOLN COMPARISON:  CT 01/18/2012. FINDINGS: Lower chest: Atelectasis at both lung bases is similar to the prior examination. There is mild cardiomegaly  status post median sternotomy and CABG. Cardiac pacemaker noted. There is no pleural or pericardial effusion. Hepatobiliary: The hepatic density is diffusely decreased, consistent with steatosis. No focal abnormality observed. No evidence of gallstones, gallbladder wall thickening or biliary dilatation. Pancreas: Unremarkable. No pancreatic ductal dilatation or surrounding inflammatory changes. Spleen: Normal in size without focal abnormality. Adrenals/Urinary Tract: Both adrenal glands appear normal. There are low-density renal cysts bilaterally. No suspicious renal findings are demonstrated. There is high density within the renal collecting systems bilaterally on the early images, appearing to reflect early contrast excretion. Renal calculi are difficult to exclude. There is no evidence of ureteral or bladder calculus. There is no hydronephrosis. The bladder appears stable with mild chronic wall thickening. Stomach/Bowel: No evidence of bowel wall thickening, distention or surrounding inflammatory change. There is mild distal colonic diverticulosis. The appendix appears normal. Vascular/Lymphatic: There are no enlarged abdominal or pelvic lymph  nodes. There is diffuse atherosclerosis of the aorta, its branches and the iliac arteries. Reproductive: The prostate gland appears unchanged. Other: No evidence of abdominal wall mass or hernia. Musculoskeletal: No acute or significant osseous findings. IMPRESSION: 1. No acute findings or explanation for the patient's symptoms. No evidence of bowel obstruction or abscess. 2. Hepatic steatosis. 3. Extensive atherosclerosis. 4. Chronic bibasilar atelectasis and cardiomegaly. 5. Grossly stable bilateral renal cysts. Renal calculi difficult to exclude. No evidence of ureteral calculus or hydronephrosis. Electronically Signed   By: Richardean Sale M.D.   On: 01/26/2015 16:46   Dg Chest Port 1 View  01/29/2015  CLINICAL DATA:  Check pacer placement EXAM: PORTABLE CHEST 1 VIEW  COMPARISON:  01/27/2015 FINDINGS: There is a left chest wall pacer device with lead in the projection of the right ventricle. Heart size is enlarged. Previous median sternotomy and CABG procedure. Mild pulmonary edema is noted. IMPRESSION: 1. The left chest wall pacer device is noted with lead the right ventricle. 2. Mild CHF. Electronically Signed   By: Kerby Moors M.D.   On: 01/29/2015 08:46   Dg Chest Port 1 View  01/27/2015  CLINICAL DATA:  Sepsis, acute respiratory failure, history of previous CVA. EXAM: PORTABLE CHEST 1 VIEW COMPARISON:  Portable chest x-ray of October 27, 2015 FINDINGS: Patient is is mildly rotated to the left on today's study. The lungs are adequately inflated. There is persistent increased density at the left lung base with partial obscuration of the hemidiaphragm. The cardiac silhouette remains enlarged. The pulmonary vascularity is not engorged. The permanent pacemaker is in reasonable position. There are 7 intact sternal wires from previous CABG. IMPRESSION: Stable appearance of the chest since yesterday's study. There is mild bibasilar subsegmental atelectasis greatest on the left. Electronically Signed   By: David  Martinique M.D.   On: 01/27/2015 07:19   Dg Chest Port 1 View  01/26/2015  CLINICAL DATA:  Fever EXAM: PORTABLE CHEST 1 VIEW COMPARISON:  12/13/2014 FINDINGS: Cardiomegaly again noted. Study is limited by patient's large body habitus. Status post CABG. Dual lead cardiac pacemaker is unchanged in position. No pulmonary edema. Bilateral basilar atelectasis. No segmental infiltrates. Lung bases are partially obscured by patient's upper abdomen. IMPRESSION: Limited exam by patient's large body habitus. No gross infiltrate or pulmonary edema. Bilateral basilar atelectasis. Dual lead cardiac pacemaker is unchanged in position. Electronically Signed   By: Lahoma Crocker M.D.   On: 01/26/2015 11:39    Oren Binet, MD  [facility] Pager:[phone]  If 7PM-7AM,  please contact night-coverage [email or web address] Password [email or web address] 02/01/2015, 11:41 AM   LOS: 6 days

## 2015-02-02 ENCOUNTER — Inpatient Hospital Stay (HOSPITAL_COMMUNITY): Payer: Medicare Other

## 2015-02-02 DIAGNOSIS — I25810 Atherosclerosis of coronary artery bypass graft(s) without angina pectoris: Secondary | ICD-10-CM

## 2015-02-02 DIAGNOSIS — N400 Enlarged prostate without lower urinary tract symptoms: Secondary | ICD-10-CM

## 2015-02-02 LAB — BASIC METABOLIC PANEL
Anion gap: 6 (ref 5–15)
BUN: 14 mg/dL (ref 6–20)
CALCIUM: 9.3 mg/dL (ref 8.9–10.3)
CO2: 26 mmol/L (ref 22–32)
CREATININE: 0.95 mg/dL (ref 0.61–1.24)
Chloride: 107 mmol/L (ref 101–111)
GLUCOSE: 188 mg/dL — AB (ref 65–99)
Potassium: 3.3 mmol/L — ABNORMAL LOW (ref 3.5–5.1)
Sodium: 139 mmol/L (ref 135–145)

## 2015-02-02 LAB — GLUCOSE, CAPILLARY
GLUCOSE-CAPILLARY: 250 mg/dL — AB (ref 65–99)
GLUCOSE-CAPILLARY: 254 mg/dL — AB (ref 65–99)
Glucose-Capillary: 204 mg/dL — ABNORMAL HIGH (ref 65–99)

## 2015-02-02 LAB — VANCOMYCIN, TROUGH: VANCOMYCIN TR: 19 ug/mL (ref 10.0–20.0)

## 2015-02-02 MED ORDER — HYDROCODONE-ACETAMINOPHEN 5-325 MG PO TABS: 1.0000 | ORAL_TABLET | Freq: Four times a day (QID) | ORAL | Status: DC | PRN

## 2015-02-02 MED ORDER — CLONAZEPAM 0.5 MG PO TABS: 0.5000 mg | ORAL_TABLET | ORAL | Status: DC

## 2015-02-02 MED ORDER — LIDOCAINE HCL 1 % IJ SOLN
INTRAMUSCULAR | Status: AC
  Filled 2015-02-02: qty 20

## 2015-02-02 MED ORDER — VANCOMYCIN HCL 10 G IV SOLR: 1500.0000 mg | Freq: Two times a day (BID) | INTRAVENOUS | Status: DC

## 2015-02-02 MED ORDER — POTASSIUM CHLORIDE CRYS ER 20 MEQ PO TBCR
40.0000 meq | EXTENDED_RELEASE_TABLET | Freq: Once | ORAL | Status: AC
  Administered 2015-02-02: 40 meq via ORAL
  Filled 2015-02-02: qty 2

## 2015-02-02 MED ORDER — INSULIN GLARGINE 100 UNIT/ML ~~LOC~~ SOLN: 40.0000 [IU] | Freq: Two times a day (BID) | SUBCUTANEOUS | Status: DC

## 2015-02-02 NOTE — Progress Notes (Signed)
PT Cancellation Note  Patient Details Name: [name] MRN: [id number] DOB: 28-May-1940   Cancelled Treatment:    Reason Eval/Treat Not Completed: Patient declined, no reason specified; attempted to see pt this pm.  He was frustrated due to not discharged yet.  Encouraged participation to pass the time.  However, pt agitated and continued to decline.  Will attempt another day if not d/c.   Reginia Naas 02/02/2015, 5:10 PM  Magda Kiel, [place] 02/02/2015

## 2015-02-02 NOTE — Clinical Social Work Note (Addendum)
Patient to be d/c'ed today to [facility].  Patient and family agreeable to plans will transport via ems RN to call report to [phone]. Evette Cristal, MSW, [place]

## 2015-02-02 NOTE — Progress Notes (Addendum)
Inpatient Diabetes Program Recommendations  AACE/ADA: New Consensus Statement on Inpatient Glycemic Control (2015)  Target Ranges:  Prepandial:   less than 140 mg/dL      Peak postprandial:   less than 180 mg/dL (1-2 hours)      Critically ill patients:  140 - 180 mg/dL   Review of Glycemic Control  Inpatient Diabetes Program Recommendations:  Increase Novolog to resistant scale.  Thank you  Raoul Pitch BSN, RN,CDE Inpatient Diabetes Coordinator [phone] (team pager)

## 2015-02-02 NOTE — Care Management Important Message (Signed)
Important Message  Patient Details  Name: MANLEY TUNKS MRN: [id number] Date of Birth: 13-May-1940   Medicare Important Message Given:  Yes    Lacretia Leigh, RN 02/02/2015, 10:48 AM

## 2015-02-02 NOTE — NC FL2 (Signed)
[place] LEVEL OF CARE SCREENING TOOL     IDENTIFICATION  Patient Name: [name] Birthdate: Aug 08, 1940 Sex: male Admission Date (Current Location): 01/26/2015  [place] and [place] Number:  [name] [id number] [place] and Address:  [facility] [place], [place] [address] [place], [place] [address]      Provider Number: [id number]  Attending Physician Name and Address:  Jonetta Osgood, MD  Relative Name and Phone Number:  Ido Glasson, wife.  [phone].    Current Level of Care: Hospital Recommended Level of Care: [place] Prior Approval Number:    Date Approved/Denied:   PASRR Number: [id number]  Discharge Plan: SNF    Current Diagnoses: Patient Active Problem List   Diagnosis Date Noted  . Staphylococcus aureus bacteremia with sepsis ([place]) 01/28/2015  . Infection of pacemaker pocket ([place]) 01/28/2015  . Acute respiratory failure ([place]) 01/26/2015  . AKI (acute kidney injury) ([place]) 01/26/2015  . Acute encephalopathy 01/26/2015  . Altered mental status   . Vitamin D deficiency 01/07/2015  . Mobitz type 2 second degree atrioventricular block 12/12/2014  . Pacemaker 12/12/2014  . Symptomatic bradycardia 12/03/2014  . Lower back pain 06/12/2012  . Right hip pain 06/12/2012  . Intertrigo 05/09/2012  . Insomnia 04/19/2012  . General weakness 03/07/2012  . 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 Diarrhea 01/17/2012  . Pyelonephritis 01/15/2012  . Peripheral neuropathy ([place]) 09/28/2011  . Urinary incontinence 09/28/2011  . Preventative health care 09/23/2011  . Gout 09/23/2011  . Bipolar affective disorder ([place]) 09/23/2011  . TIA (transient ischemic attack) 09/23/2011  . Chronic pain 09/23/2011  . DNR (do not resuscitate) 09/23/2011  . CVA (cerebral infarction) 09/23/2011  . 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 History of alcohol abuse 09/23/2011  . BPH (benign prostatic hypertrophy)   . Left  ventricular systolic dysfunction   . Ischemic cardiomyopathy 05/05/2011  . Edema 01/03/2011  . CAD (coronary artery disease) 05/18/2010  . Hyperlipidemia 05/18/2010  . Other abnormality of urination(788.69) 11/25/2009  . Other specified forms of chronic ischemic heart disease 05/19/2009  . CAD, ARTERY BYPASS GRAFT 11/18/2008  . AMI, INFERIOR WALL 09/30/2008  . Obstructive sleep apnea 09/16/2008  . SINUS TACHYCARDIA 09/02/2008  . DM (diabetes mellitus), type 2 with peripheral vascular complications ([place]) [id number]  . OBESITY 09/01/2008  . Hypertensive heart disease with heart failure ([place]) 09/01/2008  . TOBACCO ABUSE 07/31/2007  . 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  BEHAVIORAL SYMPTOMS/MOOD NEUROLOGICAL BOWEL NUTRITION STATUS      Continent Diet  AMBULATORY STATUS COMMUNICATION OF NEEDS Skin   Limited Assist Verbally Normal                       Personal Care Assistance Level of Assistance  Bathing, Dressing Bathing Assistance: Limited assistance   Dressing Assistance: Limited assistance     Functional Limitations Info             SPECIAL CARE FACTORS FREQUENCY   PT        3x a week            Contractures      Additional Factors Info  Code Status, Allergies, Isolation Precautions Code Status Info: DNR Allergies Info: Dexedrine, Oxycodone     Isolation Precautions Info: MRSA     Current Medications (02/02/2015):  This is  the current hospital active medication list Current Facility-Administered Medications  Medication Dose Route Frequency Provider Last Rate Last Dose  . acetaminophen (TYLENOL) tablet 650 mg  650 mg Oral Q6H PRN Waldemar Dickens, MD   650 mg at 02/02/15 [id number]   Or  . acetaminophen (TYLENOL) suppository 650 mg  650 mg Rectal Q6H PRN Waldemar Dickens, MD      . antiseptic oral rinse (CPC /  CETYLPYRIDINIUM CHLORIDE 0.05%) solution 7 mL  7 mL Mouth Rinse BID Waldemar Dickens, MD   7 mL at 02/02/15 1000  . aspirin chewable tablet 81 mg  81 mg Oral QHS Waldemar Dickens, MD   81 mg at 02/01/15 2158  . clonazePAM (KLONOPIN) tablet 0.5 mg  0.5 mg Oral QHS Kimberly B Hammons, RPH   0.5 mg at 02/01/15 2200  . clonazePAM (KLONOPIN) tablet 0.5 mg  0.5 mg Oral TID PRN Theone Murdoch Hammons, RPH   0.5 mg at 01/29/15 1303  . docusate sodium (COLACE) capsule 100 mg  100 mg Oral BID PRN Ritta Slot, NP   100 mg at 01/31/15 1758  . doxazosin (CARDURA) tablet 1 mg  1 mg Oral QHS Waldemar Dickens, MD   1 mg at 02/01/15 2206  . gabapentin (NEURONTIN) capsule 100 mg  100 mg Oral QHS Waldemar Dickens, MD   100 mg at 02/01/15 2201  . guaifenesin (ROBITUSSIN) 100 MG/5ML syrup 100 mg  100 mg Oral Q6H PRN [name], MD      . heparin injection 5,000 Units  5,000 Units Subcutaneous 3 times per day [name], MD   5,000 Units at 02/02/15 1543  . HYDROcodone-acetaminophen (NORCO/VICODIN) 5-325 MG per tablet 1 tablet  1 tablet Oral Q4H PRN Reyne Dumas, MD   1 tablet at 02/01/15 2201  . insulin aspart (novoLOG) injection 0-15 Units  0-15 Units Subcutaneous TID WC Waldemar Dickens, MD   8 Units at 02/02/15 1710  . insulin aspart (novoLOG) injection 0-5 Units  0-5 Units Subcutaneous QHS Waldemar Dickens, MD   2 Units at 02/01/15 2207  . insulin glargine (LANTUS) injection 40 Units  40 Units Subcutaneous BID Domenic Polite, MD   40 Units at 02/02/15 0948  . lamoTRIgine (LAMICTAL) tablet 100 mg  100 mg Oral Daily Waldemar Dickens, MD   100 mg at 02/02/15 0947  . lidocaine (XYLOCAINE) 1 % (with pres) injection           . loperamide (IMODIUM) capsule 4 mg  4 mg Oral PRN [name], MD      . nitroGLYCERIN (NITROSTAT) SL tablet 0.4 mg  0.4 mg Sublingual Q5 min PRN [name], MD      . ondansetron [facility]) tablet 4 mg  4 mg Oral Q6H PRN Waldemar Dickens, MD       Or  . ondansetron [facility]) injection 4 mg  4 mg  Intravenous Q6H PRN Waldemar Dickens, MD      . sodium chloride 0.9 % injection 3 mL  3 mL Intravenous Q12H Waldemar Dickens, MD   3 mL at 02/01/15 2206  . traMADol (ULTRAM) tablet 50 mg  50 mg Oral Q12H PRN Waldemar Dickens, MD      . traZODone (DESYREL) tablet 50 mg  50 mg Oral QHS Waldemar Dickens, MD   50 mg at 02/01/15 2207  . vancomycin (VANCOCIN) 1,500 mg in sodium chloride 0.9 % 500 mL IVPB  1,500  mg Intravenous Q12H Meagan A Decker, RPH   1,500 mg at 02/02/15 1631  . Vitamin D (Ergocalciferol) (DRISDOL) capsule 50,000 Units  50,000 Units Oral Q Fri [name], MD   50,000 Units at 01/30/15 1224     Discharge Medications: Please see discharge summary for a list of discharge medications.  Relevant Imaging Results:  Relevant Lab Results:   Additional Information    Taher Vannote, Jones Broom, LCSWA

## 2015-02-02 NOTE — Progress Notes (Signed)
Pharmacy Antibiotic Follow-up Note  [name] is a 75 y.o. year-old male admitted on 01/26/2015.  The patient is currently on day 8 of vancomycin for MRSA bacteremia and pacer pocket infection.  Assessment/Plan: Vanc trough of 11 (subtherapeutic) on 1/14. Vancomycin dose was increased to 1500mg  IV Q12H for calculated trough. Today's trough 19 (therapeautic) was 1 hour early, true trough would still be therapeutic.   Continue Vancomycin at 1500mg  Q12 Patient to be discharged today with vancomycin until 02/21/14 and follow-up with the ID clinic.  Temp (24hrs), Avg:98.3 F (36.8 C), Min:98.2 F (36.8 C), Max:98.5 F (36.9 C)   Recent Labs Lab 01/27/15 0227 01/28/15 0540 01/30/15 0252 01/31/15 0305 02/01/15 0245  WBC 9.0 5.3 4.6 5.2 6.4     Recent Labs Lab 01/28/15 0540 01/30/15 0252 01/31/15 0305 02/01/15 0245 02/02/15 0539  CREATININE 1.05 0.88 0.93 0.97 0.95   Estimated Creatinine Clearance: 80.4 mL/min (by C-G formula based on Cr of 0.95).    Allergies  Allergen Reactions  . Dexedrine [Dextroamphetamine Sulfate Er] Other (See Comments)    agitation  . Oxycodone Nausea And Vomiting    Thank you for allowing pharmacy to be a part of this patient's care.  Darl Pikes, PharmD Clinical Pharmacist- Resident Pager: [phone]   02/02/2015 12:47 PM

## 2015-02-02 NOTE — Procedures (Signed)
Successful LUE DL POWER PICC TIP SVC/RA NO COMP READY FOR USE

## 2015-02-02 NOTE — Clinical Social Work Note (Signed)
Clinical Social Work Assessment  Patient Details  Name: KASPAR USELTON MRN: [id number] Date of Birth: 1940-02-08  Date of referral:  02/02/15               Reason for consult:  Facility Placement                Permission sought to share information with:  Facility Sport and exercise psychologist, Family Supports Permission granted to share information::  Yes, Verbal Permission Granted  Name::     Sohrab, Vannelli [phone] or [phone]  Agency::  [facility]  Relationship::     Contact Information:     Housing/Transportation Living arrangements for the past 2 months:  [place] of Information:  Patient, Spouse Patient Interpreter Needed:  None Criminal Activity/Legal Involvement Pertinent to Current Situation/Hospitalization:  No - Comment as needed Significant Relationships:  Spouse Lives with:  Facility Resident Do you feel safe going back to the place where you live?  Yes Need for family participation in patient care:  Yes (Comment)  Care giving concerns:  Patient is from [facility], patient and family did not have any concerns.   Social Worker assessment / plan:  Patient is a 75 year old male who lives at [employer].  Patient's wife is at bedside, patient is alert and oriented x3.  Patient did not want to talk very much to CSW, CSW completed assessment with patient's wife who is in agreement with patient returning back to SNF.  CSW explained to patient's wife the role of CSW, patient's wife did not have any other questions.  Employment status:  Retired Nurse, adult PT Recommendations:    Information / Referral to community resources:  [place]  Patient/Family's Response to care:  Patient in agreement to going to SNF for short term rehab.  Patient/Family's Understanding of and Emotional Response to Diagnosis, Current Treatment, and Prognosis:  Patient and wife aware of diagnosis and current  treatment plan.  Emotional Assessment Appearance:  Appears stated age Attitude/Demeanor/Rapport:    Affect (typically observed):  Appropriate, Calm Orientation:  Oriented to Self, Oriented to Place, Oriented to  Time Alcohol / Substance use:  Not Applicable Psych involvement (Current and /or in the community):  No (Comment)  Discharge Needs  Concerns to be addressed:  No discharge needs identified Readmission within the last 30 days:  No Current discharge risk:  None Barriers to Discharge:  No Barriers Identified   Ross Ludwig, LCSWA 02/02/2015, 5:54 PM

## 2015-02-02 NOTE — Discharge Summary (Addendum)
PATIENT DETAILS Name: [name] Age: 75 y.o. Sex: male Date of Birth: 03/01/1940 MRN: [id number]. Admitting Physician: Waldemar Dickens, MD [id number] Jenny Reichmann, MD  Admit Date: 01/26/2015 Discharge date: 02/02/2015  Recommendations for Outpatient Follow-up:  1. IV vancomycin through 02/22/2015 2. Weekly CBC/CMET/Vancomycin trough levels-please fax results to ID Clinic-Dr Megan Salon. 3. Ensure follow up with Cards/EP-Dr Lovena Le 4. Routine PICC line care 5. Ensure follow up with ID, and DO NOT discontinue IV Antibiotics unless seen by ID 6. Please follow blood/urine cultures till final 7. Note:has temp-perm pacemaker in place  PRIMARY DISCHARGE DIAGNOSIS:  Principal Problem:   Staphylococcus aureus bacteremia with sepsis ([place]) Active Problems:   DM (diabetes mellitus), type 2 with peripheral vascular complications (HCC)   CAD, ARTERY BYPASS GRAFT   BPH (benign prostatic hypertrophy)   Bipolar affective disorder (HCC)   CVA (cerebral infarction)   Acute respiratory failure (HCC)   AKI (acute kidney injury) ([place])   Acute encephalopathy   Infection of pacemaker pocket [facility])      PAST MEDICAL HISTORY: Past Medical History  Diagnosis Date  . CAD (coronary artery disease)     a. s/p CABG  . Obesity   . BPH (benign prostatic hypertrophy)   . UNIVERSAL ULCERATIVE COLITIS         . Ischemic cardiomyopathy    . DM         . OBSTRUCTIVE SLEEP APNEA         . HYPERTENSION         . Hyperlipidemia    . Gout    . Bipolar affective disorder ([place])    . TIA (transient ischemic attack)    . Chronic pain    . CVA (cerebral infarction)      Small left occipital  . Narcolepsy    . History of alcohol abuse    . Depression    . [facility] spotted fever   . Benign neoplasm of colon      Adenomatous polyps  . 2Nd degree AV block     a. s/p STJ dual chamber pacemaker 11/2014    DISCHARGE MEDICATIONS: Current Discharge Medication List    START taking these medications   Details  HYDROcodone-acetaminophen (NORCO/VICODIN) 5-325 MG tablet Take 1 tablet by mouth every 6 (six) hours as needed for moderate pain. Qty: 30 tablet, Refills: 0    vancomycin 1,500 mg in sodium chloride 0.9 % 500 mL Inject 1,500 mg into the vein every 12 (twelve) hours. vancomycin through 02/22/2015      CONTINUE these medications which have CHANGED   Details  clonazePAM (KLONOPIN) 0.5 MG tablet Take 1 tablet (0.5 mg total) by mouth See admin instructions. Take 1 tablet (0.5 mg) by mouth daily at bedtime, may also take 1 tablet (0.5 mg) 3 times daily as needed for anxiety Qty: 20 tablet, Refills: 0    insulin glargine (LANTUS) 100 UNIT/ML injection Inject 0.4 mLs (40 Units total) into the skin 2 (two) times daily. 10am and 5pm      CONTINUE these medications which have NOT CHANGED   Details  acetaminophen (TYLENOL) 325 MG tablet Take 650 mg by mouth every 4 (four) hours as needed (pain). Do not exceed 4 gm of acetaminophen in 24 hours    aspirin 81 MG chewable tablet Chew 81 mg by mouth at bedtime.    doxazosin (CARDURA) 1 MG tablet Take 1 mg by mouth at bedtime.     gabapentin (NEURONTIN) 100 MG capsule Take 100  mg by mouth at bedtime.    guaifenesin (ROBITUSSIN) 100 MG/5ML syrup Take 100 mg by mouth every 6 (six) hours as needed for cough.     insulin lispro (HUMALOG KWIKPEN) 100 UNIT/ML KiwkPen Inject 4-10 Units into the skin 4 (four) times daily. Per sliding scale: CBG 201-25 4 units, 251-300 6 units, 301-350 8 units, 351-400 10 units, >400 12 units and call MD (at 6:30am, 11:30am, 4:30 pm, 9pm)    lamoTRIgine (LAMICTAL) 100 MG tablet Take 100 mg by mouth daily. 10am    loperamide (IMODIUM) 2 MG capsule Take 4 mg by mouth as needed (after each loose stool).     nitroGLYCERIN (NITROSTAT) 0.4 MG SL tablet Place 0.4 mg under the tongue every 5 (five) minutes as needed for chest pain.    traZODone (DESYREL) 50 MG tablet Take 50 mg by mouth at bedtime.    Vitamin D,  Ergocalciferol, (DRISDOL) 50000 units CAPS capsule Take 50,000 Units by mouth every 7 (seven) days.      STOP taking these medications     oxyCODONE (OXY IR/ROXICODONE) 5 MG immediate release tablet      oxyCODONE (OXY IR/ROXICODONE) 5 MG immediate release tablet      traMADol (ULTRAM) 50 MG tablet         ALLERGIES:   Allergies  Allergen Reactions  . Dexedrine [Dextroamphetamine Sulfate Er] Other (See Comments)    agitation  . Oxycodone Nausea And Vomiting    BRIEF HPI:  See H&P, Labs, Consult and Test reports for all details in brief, patient is a 75 y.o. male with recent hx of PPM insertion was admitted for evaluation of AMS, was found to pacemaker pocket infection  CONSULTATIONS:   cardiology and ID  PERTINENT RADIOLOGIC STUDIES: Dg Abd 1 View  01/26/2015  CLINICAL DATA:  Fever, nausea, vomiting, altered mental status EXAM: ABDOMEN - 1 VIEW COMPARISON:  CT abdomen pelvis 01/26/2015, abdominal radiograph 10/07/2012 FINDINGS: Bones demineralized. Excreted contrast material within the renal collecting systems bilaterally, RIGHT ureter, and urinary bladder. RIGHT ureter appears mild dilated. Nonobstructive bowel gas pattern with scattered stool throughout colon. No evidence of bowel obstruction or wall thickening. IMPRESSION: Question mild dilatation of the RIGHT ureter though no obstructing stone is identified by prior CT, question due to mild distention of urinary bladder. Nonobstructive bowel gas pattern. Electronically Signed   By: Lavonia Dana M.D.   On: 01/26/2015 17:30   Ct Head Wo Contrast  01/26/2015  CLINICAL DATA:  75 year old with altered mental status. EXAM: CT HEAD WITHOUT CONTRAST TECHNIQUE: Contiguous axial images were obtained from the base of the skull through the vertex without intravenous contrast. COMPARISON:  Head CT 10/07/2012 and 07/22/2012. FINDINGS: There is no evidence of acute intracranial hemorrhage, mass lesion, brain edema or extra-axial fluid  collection. Mild generalized atrophy appears unchanged. There is patchy and confluent low-density in the periventricular white matter which appears mildly progressive. 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 Subcortical low-density posteriorly in the right frontal lobe appears progressive (image 24). No evidence of cortical based infarct. Diffuse intracranial vascular calcifications are noted. The visualized paranasal sinuses, mastoid air cells and middle ears are clear. The calvarium is intact. IMPRESSION: Mildly progressive chronic small vessel ischemic changes compared with prior CT from 2014. No acute intracranial findings. Electronically Signed   By: Richardean Sale M.D.   On: 01/26/2015 16:07   Ct Abdomen Pelvis W Contrast  01/26/2015  CLINICAL DATA:  Fever with nausea and vomiting. Altered mental status. History  of BPH and atherosclerosis. EXAM: CT ABDOMEN AND PELVIS WITH CONTRAST TECHNIQUE: Multidetector CT imaging of the abdomen and pelvis was performed using the standard protocol following bolus administration of intravenous contrast. CONTRAST:  154mL OMNIPAQUE IOHEXOL 300 MG/ML  SOLN COMPARISON:  CT 01/18/2012. FINDINGS: Lower chest: Atelectasis at both lung bases is similar to the prior examination. There is mild cardiomegaly status post median sternotomy and CABG. Cardiac pacemaker noted. There is no pleural or pericardial effusion. Hepatobiliary: The hepatic density is diffusely decreased, consistent with steatosis. No focal abnormality observed. No evidence of gallstones, gallbladder wall thickening or biliary dilatation. Pancreas: Unremarkable. No pancreatic ductal dilatation or surrounding inflammatory changes. Spleen: Normal in size without focal abnormality. Adrenals/Urinary Tract: Both adrenal glands appear normal. There are low-density renal cysts bilaterally. No suspicious renal findings are demonstrated. There is high density within the renal collecting systems  bilaterally on the early images, appearing to reflect early contrast excretion. Renal calculi are difficult to exclude. There is no evidence of ureteral or bladder calculus. There is no hydronephrosis. The bladder appears stable with mild chronic wall thickening. Stomach/Bowel: No evidence of bowel wall thickening, distention or surrounding inflammatory change. There is mild distal colonic diverticulosis. The appendix appears normal. Vascular/Lymphatic: There are no enlarged abdominal or pelvic lymph nodes. There is diffuse atherosclerosis of the aorta, its branches and the iliac arteries. Reproductive: The prostate gland appears unchanged. Other: No evidence of abdominal wall mass or hernia. Musculoskeletal: No acute or significant osseous findings. IMPRESSION: 1. No acute findings or explanation for the patient's symptoms. No evidence of bowel obstruction or abscess. 2. Hepatic steatosis. 3. Extensive atherosclerosis. 4. Chronic bibasilar atelectasis and cardiomegaly. 5. Grossly stable bilateral renal cysts. Renal calculi difficult to exclude. No evidence of ureteral calculus or hydronephrosis. Electronically Signed   By: Richardean Sale M.D.   On: 01/26/2015 16:46   Dg Chest Port 1 View  01/29/2015  CLINICAL DATA:  Check pacer placement EXAM: PORTABLE CHEST 1 VIEW COMPARISON:  01/27/2015 FINDINGS: There is a left chest wall pacer device with lead in the projection of the right ventricle. Heart size is enlarged. Previous median sternotomy and CABG procedure. Mild pulmonary edema is noted. IMPRESSION: 1. The left chest wall pacer device is noted with lead the right ventricle. 2. Mild CHF. Electronically Signed   By: Kerby Moors M.D.   On: 01/29/2015 08:46   Dg Chest Port 1 View  01/27/2015  CLINICAL DATA:  Sepsis, acute respiratory failure, history of previous CVA. EXAM: PORTABLE CHEST 1 VIEW COMPARISON:  Portable chest x-ray of October 27, 2015 FINDINGS: Patient is is mildly rotated to the left on today's  study. The lungs are adequately inflated. There is persistent increased density at the left lung base with partial obscuration of the hemidiaphragm. The cardiac silhouette remains enlarged. The pulmonary vascularity is not engorged. The permanent pacemaker is in reasonable position. There are 7 intact sternal wires from previous CABG. IMPRESSION: Stable appearance of the chest since yesterday's study. There is mild bibasilar subsegmental atelectasis greatest on the left. Electronically Signed   By: David  Martinique M.D.   On: 01/27/2015 07:19   Dg Chest Port 1 View  01/26/2015  CLINICAL DATA:  Fever EXAM: PORTABLE CHEST 1 VIEW COMPARISON:  12/13/2014 FINDINGS: Cardiomegaly again noted. Study is limited by patient's large body habitus. Status post CABG. Dual lead cardiac pacemaker is unchanged in position. No pulmonary edema. Bilateral basilar atelectasis. No segmental infiltrates. Lung bases are partially obscured by patient's upper abdomen. IMPRESSION: Limited  exam by patient's large body habitus. No gross infiltrate or pulmonary edema. Bilateral basilar atelectasis. Dual lead cardiac pacemaker is unchanged in position. Electronically Signed   By: Lahoma Crocker M.D.   On: 01/26/2015 11:39     PERTINENT LAB RESULTS: CBC:  Recent Labs  01/31/15 0305 02/01/15 0245  WBC 5.2 6.4  HGB 13.2 13.6  HCT 37.9* 39.1  PLT 128* 204   CMET CMP     Component Value Date/Time   NA 139 02/02/2015 0539   K 3.3* 02/02/2015 0539   CL 107 02/02/2015 0539   CO2 26 02/02/2015 0539   GLUCOSE 188* 02/02/2015 0539   BUN 14 02/02/2015 0539   CREATININE 0.95 02/02/2015 0539   CREATININE 1.06 12/05/2014 1536   CALCIUM 9.3 02/02/2015 0539   PROT 5.8* 02/01/2015 0245   ALBUMIN 2.2* 02/01/2015 0245   AST 27 02/01/2015 0245   ALT 30 02/01/2015 0245   ALKPHOS 56 02/01/2015 0245   BILITOT 0.4 02/01/2015 0245   GFRNONAA >60 02/02/2015 0539   GFRAA >60 02/02/2015 0539    GFR Estimated Creatinine Clearance: 80.4 mL/min  (by C-G formula based on Cr of 0.95). No results for input(s): LIPASE, AMYLASE in the last 72 hours. No results for input(s): CKTOTAL, CKMB, CKMBINDEX, TROPONINI in the last 72 hours. Invalid input(s): POCBNP No results for input(s): DDIMER in the last 72 hours. No results for input(s): HGBA1C in the last 72 hours. No results for input(s): CHOL, HDL, LDLCALC, TRIG, CHOLHDL, LDLDIRECT in the last 72 hours. No results for input(s): TSH, T4TOTAL, T3FREE, THYROIDAB in the last 72 hours.  Invalid input(s): FREET3 No results for input(s): VITAMINB12, FOLATE, FERRITIN, TIBC, IRON, RETICCTPCT in the last 72 hours. Coags: No results for input(s): INR in the last 72 hours.  Invalid input(s): PT Microbiology: Recent Results (from the past 240 hour(s))  Blood Culture (routine x 2)     Status: None   Collection Time: 01/26/15 11:16 AM  Result Value Ref Range Status   Specimen Description BLOOD LEFT HAND  Final   Special Requests BOTTLES DRAWN AEROBIC AND ANAEROBIC 4CC  Final   Culture  Setup Time   Final    GRAM POSITIVE COCCI IN CLUSTERS IN BOTH AEROBIC AND ANAEROBIC BOTTLES CRITICAL RESULT CALLED TO, READ BACK BY AND VERIFIED WITH: T NEILSON @ [id number] 01/27/15 MKELLYM    Culture METHICILLIN RESISTANT STAPHYLOCOCCUS AUREUS  Final   Report Status 01/29/2015 FINAL  Final   Organism ID, Bacteria METHICILLIN RESISTANT STAPHYLOCOCCUS AUREUS  Final      Susceptibility   Methicillin resistant staphylococcus aureus - MIC*    CIPROFLOXACIN >=8 RESISTANT Resistant     ERYTHROMYCIN >=8 RESISTANT Resistant     GENTAMICIN <=0.5 SENSITIVE Sensitive     OXACILLIN >=4 RESISTANT Resistant     TETRACYCLINE <=1 SENSITIVE Sensitive     VANCOMYCIN 1 SENSITIVE Sensitive     TRIMETH/SULFA <=10 SENSITIVE Sensitive     CLINDAMYCIN >=8 RESISTANT Resistant     RIFAMPIN <=0.5 SENSITIVE Sensitive     Inducible Clindamycin NEGATIVE Sensitive     * METHICILLIN RESISTANT STAPHYLOCOCCUS AUREUS  Blood Culture (routine x  2)     Status: None   Collection Time: 01/26/15 11:55 AM  Result Value Ref Range Status   Specimen Description BLOOD LEFT ANTECUBITAL  Final   Special Requests BOTTLES DRAWN AEROBIC AND ANAEROBIC 5CC  Final   Culture  Setup Time   Final    GRAM POSITIVE COCCI IN CLUSTERS IN BOTH AEROBIC  AND ANAEROBIC BOTTLES CRITICAL RESULT CALLED TO, READ BACK BY AND VERIFIED WITH: T NEILSON 0332 01/27/15 MKELLY    Culture   Final    STAPHYLOCOCCUS AUREUS SUSCEPTIBILITIES PERFORMED ON PREVIOUS CULTURE WITHIN THE LAST 5 DAYS.    Report Status 01/29/2015 FINAL  Final  Urine culture     Status: None   Collection Time: 01/26/15  2:47 PM  Result Value Ref Range Status   Specimen Description URINE, RANDOM  Final   Special Requests NONE  Final   Culture NO GROWTH 1 DAY  Final   Report Status 01/27/2015 FINAL  Final  MRSA PCR Screening     Status: Abnormal   Collection Time: 01/26/15 10:20 PM  Result Value Ref Range Status   MRSA by PCR POSITIVE (A) NEGATIVE Final    Comment:        The GeneXpert MRSA Assay (FDA approved for NASAL specimens only), is one component of a comprehensive MRSA colonization surveillance program. It is not intended to diagnose MRSA infection nor to guide or monitor treatment for MRSA infections. RESULT CALLED TO, READ BACK BY AND VERIFIED WITH: T WILSON,RN @0134  01/27/15 MKELLY   Culture, blood (routine x 2)     Status: None   Collection Time: 01/27/15  4:42 PM  Result Value Ref Range Status   Specimen Description BLOOD RIGHT HAND  Final   Special Requests IN PEDIATRIC BOTTLE 3CC  Final   Culture  Setup Time   Final    GRAM POSITIVE COCCI IN CLUSTERS PEDIATRIC BOTTLE CRITICAL RESULT CALLED TO, READ BACK BY AND VERIFIED WITH: A AGUIRRE,RN AT 1136 01/28/15 BY L BENFIELD    Culture   Final    STAPHYLOCOCCUS AUREUS SUSCEPTIBILITIES PERFORMED ON PREVIOUS CULTURE WITHIN THE LAST 5 DAYS.    Report Status 01/29/2015 FINAL  Final  Culture, blood (routine x 2)     Status:  None   Collection Time: 01/27/15  4:44 PM  Result Value Ref Range Status   Specimen Description BLOOD LEFT HAND  Final   Special Requests IN PEDIATRIC BOTTLE 2CC  Final   Culture  Setup Time   Final    GRAM POSITIVE COCCI IN CLUSTERS PEDIATRIC BOTTLE CRITICAL RESULT CALLED TO, READ BACK BY AND VERIFIED WITH: A AGUIRRE,RN AT 1136 01/28/15 BY L BENFIELD    Culture   Final    STAPHYLOCOCCUS AUREUS SUSCEPTIBILITIES PERFORMED ON PREVIOUS CULTURE WITHIN THE LAST 5 DAYS.    Report Status 01/29/2015 FINAL  Final  Culture, blood (routine x 2)     Status: None (Preliminary result)   Collection Time: 01/29/15 10:30 AM  Result Value Ref Range Status   Specimen Description BLOOD LEFT HAND  Final   Special Requests BOTTLES DRAWN AEROBIC ONLY 0.5CC  Final   Culture NO GROWTH 3 DAYS  Final   Report Status PENDING  Incomplete  Culture, blood (routine x 2)     Status: None (Preliminary result)   Collection Time: 01/29/15 10:41 AM  Result Value Ref Range Status   Specimen Description BLOOD RIGHT HAND  Final   Special Requests BOTTLES DRAWN AEROBIC ONLY 0.5CC  Final   Culture NO GROWTH 3 DAYS  Final   Report Status PENDING  Incomplete     BRIEF HOSPITAL COURSE:  MRSA bacteremia with sepsis: Sepsis pathophysiology has resolved. 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 Underwent pacemaker extraction, and insertion of a temp-perm pacemaker on 01/28/15. Transthoracic echocardiogram negative for vegetations. Evaluated by infectious disease, recommendations are to continue vancomycin  through 02/22/2015. Repeat blood cultures on 01/29/15 negative. Plans are to discharge to SNF on 02/02/15 once PICC line is placed..Please check weekly CBC/CMET/Vanco trough levels and fax results to ID Clinic. Please ensure follow up with cardiology and ID.   Acute encephalopathy: Secondary to above, resolved-back to baseline. CT head on 1/9 negative.  AKI: Resolved, likely prerenal azotemia from  sepsis  Hypokalemia: Replete and recheck  History of Mobitz 2 AV block: Permanent Pacemaker explanted on 01/28/15 due to MRSA bacteremia with pacemaker pocket temp, perm pacemaker remains in place, plans are to reimplant pacemaker once patient has cleared his infection. Will need close outpatient follow-up with cardiology/EP  History of CAD-status post CABG in 2010: Currently without any chest pain or shortness of breath. Appears stable.  History of chronic systolic heart failure: Transthoracic echo on 01/29/15 showed EF around 35-40%. Clinically compensated.continue with aspirin.  Insulin-dependent diabetes: CBGs relatively stable with 40 units of Lantus twice a day and SSI. Continue to monitor and adjust while at SNF   Hypertension: Controlled with Cardura  History of peripheral neuropathy/chronic pain syndrome: Continue Neurontin, narcotics  Bipolar disorder:Stable- Continue trazodone, Lamictal and as needed Klonopin  History of dementia: Currently very pleasant, stable and appears at baseline.  TODAY-DAY OF DISCHARGE:  Subjective:   [name] today has no headache,no chest abdominal pain,no new weakness tingling or numbness, feels much better wants to go home today.He remains pleasantly confused  Objective:   Blood pressure 132/89, pulse 70, temperature 98.4 F (36.9 C), temperature source Oral, resp. rate 23, height 5' 8.5" (1.74 m), weight 110.3 kg (243 lb 2.7 oz), SpO2 91 %.  Intake/Output Summary (Last 24 hours) at 02/02/15 0808 Last data filed at 02/01/15 2200  Gross per 24 hour  Intake   1210 ml  Output      0 ml  Net   1210 ml   Filed Weights   01/26/15 1214 01/26/15 2030 01/28/15 1800  Weight: 106.595 kg (235 lb) 108.6 kg (239 lb 6.7 oz) 110.3 kg (243 lb 2.7 oz)    Exam Awake and pleasantly confused. No new F.N deficits, Normal affect [place].AT,PERRAL Supple Neck,No JVD, No cervical lymphadenopathy appriciated.  Symmetrical Chest wall movement, Good air  movement bilaterally, CTAB RRR,No Gallops,Rubs or new Murmurs, No Parasternal Heave +ve B.Sounds, Abd Soft, Non tender, No organomegaly appriciated, No rebound -guarding or rigidity. No Cyanosis, Clubbing or edema, No new Rash or bruise  DISCHARGE CONDITION: Stable  DISPOSITION: SNF  CODE STATUS: DNR  DISCHARGE INSTRUCTIONS:    Activity:  As tolerated with Full fall precautions use walker/cane & assistance as needed  Get Medicines reviewed and adjusted: Please take all your medications with you for your next visit with your Primary MD  Please request your Primary MD to go over all hospital tests and procedure/radiological results at the follow up, please ask your Primary MD to get all Hospital records sent to his/her office.  If you experience worsening of your admission symptoms, develop shortness of breath, life threatening emergency, suicidal or homicidal thoughts you must seek medical attention immediately by calling 911 or calling your MD immediately  if symptoms less severe.  You must read complete instructions/literature along with all the possible adverse reactions/side effects for all the Medicines you take and that have been prescribed to you. Take any new Medicines after you have completely understood and accpet all the possible adverse reactions/side effects.   Do not drive when taking Pain medications.   Do not take more than  prescribed Pain, Sleep and Anxiety Medications  Special Instructions: If you have smoked or chewed Tobacco  in the last 2 yrs please stop smoking, stop any regular Alcohol  and or any Recreational drug use.  Wear Seat belts while driving.  Please note  You were cared for by a hospitalist during your hospital stay. Once you are discharged, your primary care physician will handle any further medical issues. Please note that NO REFILLS for any discharge medications will be authorized once you are discharged, as it is imperative that you return to  your primary care physician (or establish a relationship with a primary care physician if you do not have one) for your aftercare needs so that they can reassess your need for medications and monitor your lab values.   Diet recommendation: Diabetic Diet Heart Healthy diet Fluid restriction 1.5 lit/day  Discharge Instructions    (HEART FAILURE PATIENTS) Call MD:  Anytime you have any of the following symptoms: 1) 3 pound weight gain in 24 hours or 5 pounds in 1 week 2) shortness of breath, with or without a dry hacking cough 3) swelling in the hands, feet or stomach 4) if you have to sleep on extra pillows at night in order to breathe.    Complete by:  As directed      Call MD for:  redness, tenderness, or signs of infection (pain, swelling, redness, odor or green/yellow discharge around incision site)    Complete by:  As directed      Diet - low sodium heart healthy    Complete by:  As directed      Diet Carb Modified    Complete by:  As directed      Increase activity slowly    Complete by:  As directed            Follow-up Information    Follow up with Cathlean Cower, MD. Schedule an appointment as soon as possible for a visit in 1 week.   Specialties:  Internal Medicine, Radiology   Why:  Hospital follow up   Contact information:   [address] [place] [address] [phone]       Follow up with [name], MD. Schedule an appointment as soon as possible for a visit in 2 weeks.   Specialty:  Cardiology   Why:  For wound re-check, Hospital follow up   Contact information:   [id number] [place]  [address] [phone]       Total Time spent on discharge equals 45 minutes.  SignedOren Binet 02/02/2015 8:08 AM

## 2015-02-03 LAB — CULTURE, BLOOD (ROUTINE X 2)
CULTURE: NO GROWTH
Culture: NO GROWTH

## 2015-02-04 ENCOUNTER — Non-Acute Institutional Stay (SKILLED_NURSING_FACILITY): Payer: Medicare Other | Admitting: Internal Medicine

## 2015-02-04 ENCOUNTER — Encounter: Payer: Self-pay | Admitting: Internal Medicine

## 2015-02-04 DIAGNOSIS — F3175 Bipolar disorder, in partial remission, most recent episode depressed: Secondary | ICD-10-CM | POA: Diagnosis not present

## 2015-02-04 DIAGNOSIS — F0391 Unspecified dementia with behavioral disturbance: Secondary | ICD-10-CM | POA: Diagnosis not present

## 2015-02-04 DIAGNOSIS — A4101 Sepsis due to Methicillin susceptible Staphylococcus aureus: Secondary | ICD-10-CM

## 2015-02-04 DIAGNOSIS — T827XXD Infection and inflammatory reaction due to other cardiac and vascular devices, implants and grafts, subsequent encounter: Secondary | ICD-10-CM

## 2015-02-04 DIAGNOSIS — E1151 Type 2 diabetes mellitus with diabetic peripheral angiopathy without gangrene: Secondary | ICD-10-CM | POA: Diagnosis not present

## 2015-02-04 DIAGNOSIS — G9340 Encephalopathy, unspecified: Secondary | ICD-10-CM

## 2015-02-04 DIAGNOSIS — I110 Hypertensive heart disease with heart failure: Secondary | ICD-10-CM

## 2015-02-04 DIAGNOSIS — I441 Atrioventricular block, second degree: Secondary | ICD-10-CM

## 2015-02-04 DIAGNOSIS — I25708 Atherosclerosis of coronary artery bypass graft(s), unspecified, with other forms of angina pectoris: Secondary | ICD-10-CM | POA: Diagnosis not present

## 2015-02-04 DIAGNOSIS — I5022 Chronic systolic (congestive) heart failure: Secondary | ICD-10-CM

## 2015-02-04 DIAGNOSIS — F03918 Unspecified dementia, unspecified severity, with other behavioral disturbance: Secondary | ICD-10-CM

## 2015-02-04 DIAGNOSIS — N179 Acute kidney failure, unspecified: Secondary | ICD-10-CM | POA: Diagnosis not present

## 2015-02-04 NOTE — Progress Notes (Signed)
Ambulance transport here to pick up patient. Patient given d/c instructions/scripts. Patient with no complaints at this time. Report called to receiving RN [facility] SNF. Yellow DNR with patient. Wife aware of transport.

## 2015-02-04 NOTE — Progress Notes (Signed)
MRN: [id number] Name: [name]  Sex: male Age: 75 y.o. DOB: 27-Nov-1940  [place] #: [facility] Facility/Room:217 Level Of Care: SNF Provider: Inocencio Homes D Emergency Contacts: Extended Emergency Contact Information Primary Emergency Contact: Albus,[name] Address: [address]          [place], [place] [address] [name] of [place] Phone: [phone] Mobile Phone: [phone] Relation: Spouse Secondary Emergency Contact: [name], [name] of [place] Phone: [phone] Mobile Phone: [phone] Relation: Daughter  Code Status: DNR  Allergies: Dexedrine and Oxycodone  Chief Complaint  Patient presents with  . Readmit To SNF    HPI: Patient is 75 y.o. 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 He has dementia and resides at a SNF. Pt was admitted to [facility] from 1/9-16 for a pacemaker pocket infection with MRSA and bacteremia/sepsis.Hospital course was complicated by acute encephalopathy  AKI, hypokalemia. Pt is admitted to SNF for generalized weakness and IV vancomycin until 2/5. While at SNF pt will be followed fro HTN, tx with cardura, DM2 , tx with insulin and CAD, tx with ASA and prn NTG.  Past Medical History  Diagnosis Date  . CAD (coronary artery disease)     a. s/p CABG  . Obesity   . BPH (benign prostatic hypertrophy)   . UNIVERSAL ULCERATIVE COLITIS         . Ischemic cardiomyopathy    . DM         . OBSTRUCTIVE SLEEP APNEA         . HYPERTENSION         . Hyperlipidemia    . Gout    . Bipolar affective disorder ([place])    . TIA (transient ischemic attack)    . Chronic pain    . CVA (cerebral infarction)      Small left occipital  . Narcolepsy    . History of alcohol abuse    . Depression    . [facility] spotted fever   . Benign neoplasm of colon      Adenomatous polyps  . 2Nd  degree AV block     a. s/p STJ dual chamber pacemaker 11/2014    Past Surgical History  Procedure Laterality Date  . Coronary artery bypass graft      Median sternotomy, extracorporeal circulation,  coronary artery bypass graft surgery x4 using a sequential left internal   mammary artery graft to the mid and distal left anterior descending, a   saphenous vein graft to diagonal branch of the left anterior descending,   and a saphenous vein graft to the obtuse marginal branch of left   circumflex coronary artery.    . Cardiac catheterization      Triple-vessel coronary artery disease. Low-normal left ventricular systolic function with mild   anteroapical wall motion abnormality.   . Tonsilectomy, adenoidectomy, bilateral myringotomy and tubes  1946  . Multiple extractions with alveoloplasty  12/01/2011    Procedure: MULTIPLE EXTRACION WITH ALVEOLOPLASTY;  Surgeon: Lenn Cal, DDS;  Location: [facility];  Service: Oral Surgery;  Laterality: N/A;  Extraction of tooth #'s 3,4,5,6,7,8,9,10,11,12,13,14,15,16,17,20,21,22,23,24,25,26,27,28,29,30,31,32 with alveoloplasty and bilateral mandibular lingual tori  . Flexible sigmoidoscopy  01/17/2012    Procedure: FLEXIBLE SIGMOIDOSCOPY;  Surgeon: Lafayette Dragon, MD;  Location: [facility];  Service: Endoscopy;  Laterality: N/A;  . Ep implantable  device N/A 12/12/2014    Procedure: Pacemaker Implant;  Surgeon: [name], MD;  Location: [facility];  Service: Cardiovascular;  Laterality: N/A;  . Ep implantable device N/A 01/28/2015    Procedure: PPM Generator Removal;  Surgeon: [name], MD;  Location: [facility];  Service: Cardiovascular;  Laterality: N/A;  . Cardiac catheterization N/A 01/28/2015    Procedure: Temporary Pacemaker;  Surgeon: [name], MD;  Location: [facility];  Service: Cardiovascular;  Laterality: N/A;      Medication List       This list is accurate as of: 02/04/15 11:59 PM.  Always use your most  recent med list.               acetaminophen 325 MG tablet  Commonly known as:  TYLENOL  Take 650 mg by mouth every 4 (four) hours as needed (pain). Do not exceed 4 gm of acetaminophen in 24 hours     aspirin 81 MG chewable tablet  Chew 81 mg by mouth at bedtime.     clonazePAM 0.5 MG tablet  Commonly known as:  KLONOPIN  Take 1 tablet (0.5 mg total) by mouth See admin instructions. Take 1 tablet (0.5 mg) by mouth daily at bedtime, may also take 1 tablet (0.5 mg) 3 times daily as needed for anxiety     doxazosin 1 MG tablet  Commonly known as:  CARDURA  Take 1 mg by mouth at bedtime.     gabapentin 100 MG capsule  Commonly known as:  NEURONTIN  Take 100 mg by mouth at bedtime.     guaifenesin 100 MG/5ML syrup  Commonly known as:  ROBITUSSIN  Take 100 mg by mouth every 6 (six) hours as needed for cough.     HUMALOG KWIKPEN 100 UNIT/ML KiwkPen  Generic drug:  insulin lispro  Inject 4-10 Units into the skin 4 (four) times daily. Per sliding scale: CBG 201-25 4 units, 251-300 6 units, 301-350 8 units, 351-400 10 units, >400 12 units and call MD (at 6:30am, 11:30am, 4:30 pm, 9pm)     HYDROcodone-acetaminophen 5-325 MG tablet  Commonly known as:  NORCO/VICODIN  Take 1 tablet by mouth every 6 (six) hours as needed for moderate pain.     insulin glargine 100 UNIT/ML injection  Commonly known as:  LANTUS  Inject 0.4 mLs (40 Units total) into the skin 2 (two) times daily. 10am and 5pm     lamoTRIgine 100 MG tablet  Commonly known as:  LAMICTAL  Take 100 mg by mouth daily. 10am     loperamide 2 MG capsule  Commonly known as:  IMODIUM  Take 4 mg by mouth as needed (after each loose stool).     nitroGLYCERIN 0.4 MG SL tablet  Commonly known as:  NITROSTAT  Place 0.4 mg under the tongue every 5 (five) minutes as needed for chest pain.     traZODone 50 MG tablet  Commonly known as:  DESYREL  Take 50 mg by mouth at bedtime.     vancomycin 1,500 mg in sodium chloride 0.9 % 500  mL  Inject 1,500 mg into the vein every 12 (twelve) hours. vancomycin through 02/22/2015     Vitamin D (Ergocalciferol) 50000 units Caps capsule  Commonly known as:  DRISDOL  Take 50,000 Units by mouth every 7 (seven) days.        No orders of the defined types were placed in this encounter.  There is no immunization history on file for this patient.  Social History  Substance Use Topics  . Smoking status: Former Smoker    Quit date: 01/18/2008  . Smokeless tobacco: Former Systems developer  . Alcohol Use: No    Family history is +DM2, HD   Review of Systems  DATA OBTAINED: from patient, nurse GENERAL:  no fevers, fatigue, appetite changes SKIN: No itching, rash or wounds EYES: No eye pain, redness, discharge EARS: No earache, tinnitus, change in hearing NOSE: No congestion, drainage or bleeding  MOUTH/THROAT: No mouth or tooth pain, No sore throat RESPIRATORY: No cough, wheezing, SOB CARDIAC: No chest pain, palpitations, lower extremity edema  GI: No abdominal pain, No N/V/D or constipation, No heartburn or reflux  GU: No dysuria, frequency or urgency, or incontinence  MUSCULOSKELETAL: No unrelieved bone/joint pain NEUROLOGIC: No headache, dizziness or focal weakness PSYCHIATRIC: No c/o anxiety or sadness   Filed Vitals:   02/04/15 1433  BP: 132/79  Pulse: 69  Temp: 98.2 F (36.8 C)  Resp: 18    SpO2 Readings from Last 1 Encounters:  02/09/15 92%        Physical Exam  GENERAL APPEARANCE: Alert, conversant,  No acute distress.  SKIN: No diaphoresis rash; area of former pacemaker without infection HEAD: Normocephalic, atraumatic  EYES: Conjunctiva/lids clear. Pupils round, reactive. EOMs intact.  EARS: External exam WNL, canals clear. Hearing grossly normal.  NOSE: No deformity or discharge.  MOUTH/THROAT: Lips w/o lesions  RESPIRATORY: Breathing is even, unlabored. Lung sounds are clear   CARDIOVASCULAR: Heart RRR no murmurs, rubs or gallops. No peripheral  edema.   GASTROINTESTINAL: Abdomen is soft, non-tender, not distended w/ normal bowel sounds. GENITOURINARY: Bladder non tender, not distended  MUSCULOSKELETAL: No abnormal joints or musculature NEUROLOGIC:  Cranial nerves 2-12 grossly intact. Moves all extremities  PSYCHIATRIC: Mood and affect appropriate to situation, with dementia, no behavioral issues  Patient Active Problem List   Diagnosis Date Noted  . Dementia with behavioral disturbance 02/15/2015  . Staphylococcus aureus bacteremia with sepsis ([place]) 01/28/2015  . Infection of pacemaker pocket ([place]) 01/28/2015  . Acute respiratory failure ([place]) 01/26/2015  . AKI (acute kidney injury) ([place]) 01/26/2015  . Acute encephalopathy 01/26/2015  . Altered mental status   . Vitamin D deficiency 01/07/2015  . Mobitz type 2 second degree atrioventricular block 12/12/2014  . Pacemaker 12/12/2014  . Symptomatic bradycardia 12/03/2014  . Lower back pain 06/12/2012  . Right hip pain 06/12/2012  . Intertrigo 05/09/2012  . Insomnia 04/19/2012  . General weakness 03/07/2012  . 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 Diarrhea 01/17/2012  . Pyelonephritis 01/15/2012  . Peripheral neuropathy ([place]) 09/28/2011  . Urinary incontinence 09/28/2011  . Preventative health care 09/23/2011  . Gout 09/23/2011  . Bipolar affective disorder ([place]) 09/23/2011  . TIA (transient ischemic attack) 09/23/2011  . Chronic pain 09/23/2011  . DNR (do not resuscitate) 09/23/2011  . CVA (cerebral infarction) 09/23/2011  . 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 History of alcohol abuse 09/23/2011  . BPH (benign prostatic hypertrophy)   . Left ventricular systolic dysfunction   . Chronic systolic CHF (congestive heart failure) ([place]) 05/05/2011  . Edema 01/03/2011  . CAD (coronary artery disease) 05/18/2010  . Hyperlipidemia 05/18/2010  . Other abnormality of urination(788.69) 11/25/2009  . Other specified forms of chronic ischemic heart disease 05/19/2009   . CAD, ARTERY BYPASS GRAFT 11/18/2008  . AMI, INFERIOR WALL 09/30/2008  . Obstructive sleep apnea 09/16/2008  . SINUS TACHYCARDIA 09/02/2008  . DM (diabetes  mellitus), type 2 with peripheral vascular complications ([place]) [id number]  . OBESITY 09/01/2008  . Hypertensive heart disease with heart failure ([place]) 09/01/2008  . TOBACCO ABUSE 07/31/2007  . 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    CBC    Component Value Date/Time   WBC 6.4 02/01/2015 0245   RBC 4.33 02/01/2015 0245   HGB 13.6 02/01/2015 0245   HCT 39.1 02/01/2015 0245   PLT 204 02/01/2015 0245   MCV 90.3 02/01/2015 0245   LYMPHSABS 0.7 01/26/2015 1200   MONOABS 0.9 01/26/2015 1200   EOSABS 0.0 01/26/2015 1200   BASOSABS 0.0 01/26/2015 1200    CMP     Component Value Date/Time   NA 139 02/02/2015 0539   K 3.3* 02/02/2015 0539   CL 107 02/02/2015 0539   CO2 26 02/02/2015 0539   GLUCOSE 188* 02/02/2015 0539   BUN 14 02/02/2015 0539   CREATININE 0.95 02/02/2015 0539   CREATININE 1.06 12/05/2014 1536   CALCIUM 9.3 02/02/2015 0539   PROT 5.8* 02/01/2015 0245   ALBUMIN 2.2* 02/01/2015 0245   AST 27 02/01/2015 0245   ALT 30 02/01/2015 0245   ALKPHOS 56 02/01/2015 0245   BILITOT 0.4 02/01/2015 0245   GFRNONAA >60 02/02/2015 0539   GFRAA >60 02/02/2015 0539    Lab Results  Component Value Date   HGBA1C 11.2* 06/13/2012     Dg Abd 1 View  01/26/2015  CLINICAL DATA:  Fever, nausea, vomiting, altered mental status EXAM: ABDOMEN - 1 VIEW COMPARISON:  CT abdomen pelvis 01/26/2015, abdominal radiograph 10/07/2012 FINDINGS: Bones demineralized. Excreted contrast material within the renal collecting systems bilaterally, RIGHT ureter, and urinary bladder. RIGHT ureter appears mild dilated. Nonobstructive bowel gas pattern with scattered stool throughout colon. No evidence of bowel obstruction or wall thickening. IMPRESSION: Question mild dilatation of the RIGHT ureter though no obstructing stone is identified by  prior CT, question due to mild distention of urinary bladder. Nonobstructive bowel gas pattern. Electronically Signed   By: Lavonia Dana M.D.   On: 01/26/2015 17:30   Ct Head Wo Contrast  01/26/2015  CLINICAL DATA:  75 year old with altered mental status. EXAM: CT HEAD WITHOUT CONTRAST TECHNIQUE: Contiguous axial images were obtained from the base of the skull through the vertex without intravenous contrast. COMPARISON:  Head CT 10/07/2012 and 07/22/2012. FINDINGS: There is no evidence of acute intracranial hemorrhage, mass lesion, brain edema or extra-axial fluid collection. Mild generalized atrophy appears unchanged. There is patchy and confluent low-density in the periventricular white matter which appears mildly progressive. 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 Subcortical low-density posteriorly in the right frontal lobe appears progressive (image 24). No evidence of cortical based infarct. Diffuse intracranial vascular calcifications are noted. The visualized paranasal sinuses, mastoid air cells and middle ears are clear. The calvarium is intact. IMPRESSION: Mildly progressive chronic small vessel ischemic changes compared with prior CT from 2014. No acute intracranial findings. Electronically Signed   By: Richardean Sale M.D.   On: 01/26/2015 16:07   Ct Abdomen Pelvis W Contrast  01/26/2015  CLINICAL DATA:  Fever with nausea and vomiting. Altered mental status. History of BPH and atherosclerosis. EXAM: CT ABDOMEN AND PELVIS WITH CONTRAST TECHNIQUE: Multidetector CT imaging of the abdomen and pelvis was performed using the standard protocol following bolus administration of intravenous contrast. CONTRAST:  1109mL OMNIPAQUE IOHEXOL 300 MG/ML  SOLN COMPARISON:  CT 01/18/2012. FINDINGS: Lower chest: Atelectasis at both lung bases is similar to the prior examination. There is mild cardiomegaly status post  median sternotomy and CABG. Cardiac pacemaker noted. There is no pleural or  pericardial effusion. Hepatobiliary: The hepatic density is diffusely decreased, consistent with steatosis. No focal abnormality observed. No evidence of gallstones, gallbladder wall thickening or biliary dilatation. Pancreas: Unremarkable. No pancreatic ductal dilatation or surrounding inflammatory changes. Spleen: Normal in size without focal abnormality. Adrenals/Urinary Tract: Both adrenal glands appear normal. There are low-density renal cysts bilaterally. No suspicious renal findings are demonstrated. There is high density within the renal collecting systems bilaterally on the early images, appearing to reflect early contrast excretion. Renal calculi are difficult to exclude. There is no evidence of ureteral or bladder calculus. There is no hydronephrosis. The bladder appears stable with mild chronic wall thickening. Stomach/Bowel: No evidence of bowel wall thickening, distention or surrounding inflammatory change. There is mild distal colonic diverticulosis. The appendix appears normal. Vascular/Lymphatic: There are no enlarged abdominal or pelvic lymph nodes. There is diffuse atherosclerosis of the aorta, its branches and the iliac arteries. Reproductive: The prostate gland appears unchanged. Other: No evidence of abdominal wall mass or hernia. Musculoskeletal: No acute or significant osseous findings. IMPRESSION: 1. No acute findings or explanation for the patient's symptoms. No evidence of bowel obstruction or abscess. 2. Hepatic steatosis. 3. Extensive atherosclerosis. 4. Chronic bibasilar atelectasis and cardiomegaly. 5. Grossly stable bilateral renal cysts. Renal calculi difficult to exclude. No evidence of ureteral calculus or hydronephrosis. Electronically Signed   By: Richardean Sale M.D.   On: 01/26/2015 16:46   Dg Chest Port 1 View  01/27/2015  CLINICAL DATA:  Sepsis, acute respiratory failure, history of previous CVA. EXAM: PORTABLE CHEST 1 VIEW COMPARISON:  Portable chest x-ray of October 27, 2015 FINDINGS: Patient is is mildly rotated to the left on today's study. The lungs are adequately inflated. There is persistent increased density at the left lung base with partial obscuration of the hemidiaphragm. The cardiac silhouette remains enlarged. The pulmonary vascularity is not engorged. The permanent pacemaker is in reasonable position. There are 7 intact sternal wires from previous CABG. IMPRESSION: Stable appearance of the chest since yesterday's study. There is mild bibasilar subsegmental atelectasis greatest on the left. Electronically Signed   By: David  Martinique M.D.   On: 01/27/2015 07:19   Dg Chest Port 1 View  01/26/2015  CLINICAL DATA:  Fever EXAM: PORTABLE CHEST 1 VIEW COMPARISON:  12/13/2014 FINDINGS: Cardiomegaly again noted. Study is limited by patient's large body habitus. Status post CABG. Dual lead cardiac pacemaker is unchanged in position. No pulmonary edema. Bilateral basilar atelectasis. No segmental infiltrates. Lung bases are partially obscured by patient's upper abdomen. IMPRESSION: Limited exam by patient's large body habitus. No gross infiltrate or pulmonary edema. Bilateral basilar atelectasis. Dual lead cardiac pacemaker is unchanged in position. Electronically Signed   By: Lahoma Crocker M.D.   On: 01/26/2015 11:39    Not all labs, radiology exams or other studies done during hospitalization come through on my EPIC note; however they are reviewed by me.    Assessment and Plan  Staphylococcus aureus bacteremia with sepsis ([place]) Sepsis pathophysiology has resolved. 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 Underwent pacemaker extraction, and insertion of a temp-perm pacemaker on 01/28/15. Transthoracic echocardiogram negative for vegetations. Evaluated by infectious disease, recommendations are to continue vancomycin through 02/22/2015. Repeat blood cultures on 01/29/15 negative. Plans are to discharge to SNF on 02/02/15 once PICC line is placed[name]  [facility]  CBC/CMET/Vanco trough levels and fax results to [facility]; follow up with cardiology and ID.  Acute encephalopathy Secondary to above, resolved-back to baseline. CT head on 1/9 negative  AKI (acute kidney injury) ([place]) 2/2 prerenal azotemia and sepsis, resolved SNF - follow renal fx with BMP  Mobitz type 2 second degree atrioventricular block Permanent Pacemaker explanted on 01/28/15 due to MRSA bacteremia with pacemaker pocket temp, perm pacemaker remains in place, plans are to reimplant pacemaker once patient has cleared his infection. SNF -  outpatient follow-up with cardiology/EP   CAD, ARTERY BYPASS GRAFT status post CABG in 2010: Currently without any chest pain or shortness of breath. Appears stable. SNF - cont ASA and prn NTG  Chronic systolic CHF (congestive heart failure) (H[facility]) Transthoracic echo on 01/29/15 showed EF around 35-40%. Clinically compensated. SNF - continue with aspirin.   Hypertensive heart disease with heart failure ([facility]) SNF - stable, controlled with cardura  Bipolar affective disorder ([place]) SNF - Stable- Continue trazodone, Lamictal and as needed Klonopin  Infection of pacemaker pocket ([place])  Underwent pacemaker extraction, and insertion of a temp-perm pacemaker on 01/28/15. Transthoracic echocardiogram negative for vegetations. Evaluated by infectious disease, recommendations are to continue vancomycin through 02/22/2015. Repeat blood cultures on 01/29/15 negative. Plans are to discharge to SNF on 02/02/15 once PICC line is placed.[name] -  check weekly CBC/CMET/Vanco trough levels and fax results to [facility]; follow up with cardiology and ID  DM (diabetes mellitus), type 2 with peripheral vascular complications ([place]) SNF - not reported as uncontrolled; cont Lantus 40 u BID and SSI ac and qHS  Dementia with behavioral disturbance SNF - stable and back to baseline;controlled with meds for bipolar   Time spent > 45 min;> 50% of time with patient was spent  reviewing records, labs, tests and studies, counseling and developing plan of care  [name], MD

## 2015-02-09 ENCOUNTER — Telehealth: Payer: Self-pay | Admitting: Nurse Practitioner

## 2015-02-09 ENCOUNTER — Ambulatory Visit (INDEPENDENT_AMBULATORY_CARE_PROVIDER_SITE_OTHER): Payer: Medicare Other | Admitting: Cardiology

## 2015-02-09 ENCOUNTER — Encounter: Payer: Self-pay | Admitting: Cardiology

## 2015-02-09 ENCOUNTER — Telehealth: Payer: Self-pay | Admitting: Internal Medicine

## 2015-02-09 VITALS — BP 114/62 | HR 70 | Ht 68.5 in | Wt 227.0 lb

## 2015-02-09 DIAGNOSIS — R21 Rash and other nonspecific skin eruption: Secondary | ICD-10-CM | POA: Diagnosis not present

## 2015-02-09 NOTE — Telephone Encounter (Signed)
I received a call from Joesphine Bare from [facility] skilled nursing facility. Mr. Sobalvarro developed a pruritic rash recently. It is probably an allergic reaction to vancomycin. He is now completed 15 days of therapy for his MRSA bacteremia and a pacemaker pocket infection. His pacemaker was removed. He is not having any fever. He's not having any wound or drainage. I recommended changing to daptomycin 6 mg per kilogram IV daily to continue through his appointment with me on 02/24/2015. He will have a weekly CK level checked.

## 2015-02-09 NOTE — Telephone Encounter (Signed)
Received call from Joesphine Bare, [place] at [facility] rehab regarding rash that patient has around temp-perm pacemaker site. She reports that rash started over the weekend and is felt to be vancomycin reaction.  She has spoken with ID who recommends changing to Daptomycin. There is concern about his temp pacemaker insertion. Have made appt for this afternoon with Dr Curt Bears to evaluate temp pacer site.    Chanetta Marshall, NP 02/09/2015 11:48 AM

## 2015-02-09 NOTE — Patient Instructions (Signed)
Medication Instructions:  Your physician recommends that you continue on your current medications as directed. Please refer to the Current Medication list given to you today.  Labwork: None ordered  Testing/Procedures: None ordered  Follow-Up: Keep current follow up with Dr. Lovena Le.  If you need a refill on your cardiac medications before your next appointment, please call your pharmacy.  Thank you for choosing [facility]!!

## 2015-02-09 NOTE — Progress Notes (Signed)
Electrophysiology Office Note   Date:  02/09/2015   ID:  VA CHUKWU, DOB 07-Apr-1940, MRN [id number]  PCP:  Cathlean Cower, MD  Cardiologist:  Marlou Porch Primary Electrophysiologist:  Nikesh Teschner Meredith Leeds, MD    Chief Complaint  Patient presents with  . Pacemaker Problem     History of Present Illness: [name] is a 75 y.o. male who presents today for electrophysiology evaluation.   75 yo WM with history of CAD s/p 4V CABG 10/15/08, ischemic cardiomyopathy, HTN, DM, BPH, Parkinsons disease, and dementia. His last 2D echo in Dec 0000000 showed LV systolic dysfunction with an EF of 40%.   He had a dual chamber pacemaker placed 11/25 for bradycardia and 2nd degree AV block.  On 1/11 he had a system removal due to infection with a temporary system placed as the patient was in complete heart block.  He was started on vancomycin due to MRSA sepsis in the hospital and was to finish on 02/22/15.   He presents today with a rash covering his torso.  He says that the rash is uncomfortable and has been there for a few days.    Today, he denies symptoms of palpitations, chest pain, shortness of breath, orthopnea, PND, lower extremity edema, claudication, dizziness, presyncope, syncope, bleeding, or neurologic sequela. The patient is tolerating medications without difficulties and is otherwise without complaint today.    Past Medical History  Diagnosis Date  . CAD (coronary artery disease)     a. s/p CABG  . Obesity   . BPH (benign prostatic hypertrophy)   . UNIVERSAL ULCERATIVE COLITIS         . Ischemic cardiomyopathy    . DM         . OBSTRUCTIVE SLEEP APNEA         . HYPERTENSION         . Hyperlipidemia    . Gout    . Bipolar affective disorder ([place])    . TIA (transient ischemic attack)    . Chronic pain    . CVA (cerebral infarction)      Small left occipital  . Narcolepsy    . History of alcohol abuse    . Depression    . [facility] spotted fever   . Benign  neoplasm of colon      Adenomatous polyps  . 2Nd degree AV block     a. s/p STJ dual chamber pacemaker 11/2014   Past Surgical History  Procedure Laterality Date  . Coronary artery bypass graft      Median sternotomy, extracorporeal circulation,  coronary artery bypass graft surgery x4 using a sequential left internal   mammary artery graft to the mid and distal left anterior descending, a   saphenous vein graft to diagonal branch of the left anterior descending,   and a saphenous vein graft to the obtuse marginal branch of left   circumflex coronary artery.    . Cardiac catheterization      Triple-vessel coronary artery disease. Low-normal left ventricular systolic function with mild   anteroapical wall motion abnormality.   . Tonsilectomy, adenoidectomy, bilateral myringotomy and tubes  1946  . Multiple extractions with alveoloplasty  12/01/2011    Procedure: MULTIPLE EXTRACION WITH ALVEOLOPLASTY;  Surgeon: Lenn Cal, DDS;  Location: [facility];  Service: Oral Surgery;  Laterality: N/A;  Extraction of tooth #'s 3,4,5,6,7,8,9,10,11,12,13,14,15,16,17,20,21,22,23,24,25,26,27,28,29,30,31,32 with alveoloplasty and bilateral mandibular lingual tori  . Flexible sigmoidoscopy  01/17/2012    Procedure: FLEXIBLE  SIGMOIDOSCOPY;  Surgeon: Lafayette Dragon, MD;  Location: [facility];  Service: Endoscopy;  Laterality: N/A;  . Ep implantable device N/A 12/12/2014    Procedure: Pacemaker Implant;  Surgeon: [name], MD;  Location: [facility];  Service: Cardiovascular;  Laterality: N/A;  . Ep implantable device N/A 01/28/2015    Procedure: PPM Generator Removal;  Surgeon: [name], MD;  Location: [facility] [facility] [facility];  Service: Cardiovascular;  Laterality: N/A;  . Cardiac catheterization N/A 01/28/2015    Procedure: Temporary Pacemaker;  Surgeon: [name], MD;  Location: [place] [facility];  Service: Cardiovascular;  Laterality: N/A;     Current Outpatient Prescriptions  Medication  Sig Dispense Refill  . acetaminophen (TYLENOL) 325 MG tablet Take 650 mg by mouth every 4 (four) hours as needed (pain). Do not exceed 4 gm of acetaminophen in 24 hours    . aspirin 81 MG chewable tablet Chew 81 mg by mouth at bedtime.    . clonazePAM (KLONOPIN) 0.5 MG tablet Take 1 tablet (0.5 mg total) by mouth See admin instructions. Take 1 tablet (0.5 mg) by mouth daily at bedtime, may also take 1 tablet (0.5 mg) 3 times daily as needed for anxiety 20 tablet 0  . doxazosin (CARDURA) 1 MG tablet Take 1 mg by mouth at bedtime.     . gabapentin (NEURONTIN) 100 MG capsule Take 100 mg by mouth at bedtime.    [name] Kitchen guaifenesin (ROBITUSSIN) 100 MG/5ML syrup Take 100 mg by mouth every 6 (six) hours as needed for cough.     [name] Kitchen HYDROcodone-acetaminophen (NORCO/VICODIN) 5-325 MG tablet Take 1 tablet by mouth every 6 (six) hours as needed for moderate pain. 30 tablet 0  . insulin glargine (LANTUS) 100 UNIT/ML injection Inject 0.4 mLs (40 Units total) into the skin 2 (two) times daily. 10am and 5pm    . insulin lispro (HUMALOG KWIKPEN) 100 UNIT/ML KiwkPen Inject 4-10 Units into the skin 4 (four) times daily. Per sliding scale: CBG 201-25 4 units, 251-300 6 units, 301-350 8 units, 351-400 10 units, >400 12 units and call MD (at 6:30am, 11:30am, 4:30 pm, 9pm)    . lamoTRIgine (LAMICTAL) 100 MG tablet Take 100 mg by mouth daily. 10am    . loperamide (IMODIUM) 2 MG capsule Take 4 mg by mouth as needed (after each loose stool).     . nitroGLYCERIN (NITROSTAT) 0.4 MG SL tablet Place 0.4 mg under the tongue every 5 (five) minutes as needed for chest pain.    . traZODone (DESYREL) 50 MG tablet Take 50 mg by mouth at bedtime.    . vancomycin 1,500 mg in sodium chloride 0.9 % 500 mL Inject 1,500 mg into the vein every 12 (twelve) hours. vancomycin through 02/22/2015    . Vitamin D, Ergocalciferol, (DRISDOL) 50000 units CAPS capsule Take 50,000 Units by mouth every 7 (seven) days.     No current facility-administered  medications for this visit.    Allergies:   Dexedrine and Oxycodone   Social History:  The patient  reports that he quit smoking about 7 years ago. He has quit using smokeless tobacco. He reports that he does not drink alcohol or use illicit drugs.   Family History:  The patient's family history includes Alcohol abuse in his other and other; Aortic aneurysm in his mother; Arthritis in his other and other; Diabetes in his father, other, and other; Heart attack in his mother; Heart disease in his father, other, and other; Hyperlipidemia in  his other; Hypertension in his other; Mental illness in his other and other; Stroke in his other.    ROS:  Please see the history of present illness.   Otherwise, review of systems is positive for rashAll other systems are reviewed and negative.    PHYSICAL EXAM: VS:  There were no vitals taken for this visit. , BMI There is no weight on file to calculate BMI. GEN: Well nourished, well developed, in no acute distress HEENT: normal Neck: no JVD, carotid bruits, or masses Cardiac: RRR; no murmurs, rubs, or gallops,no edema  Respiratory:  clear to auscultation bilaterally, normal work of breathing GI: soft, nontender, nondistended, + BS MS: no deformity or atrophy Skin: warm and dry, rash over torso Neuro:  Strength and sensation are intact Psych: euthymic mood, full affect  EKG:  EKG is not ordered today.  ecent Labs: 02/01/2015: ALT 30; Hemoglobin 13.6; Platelets 204 02/02/2015: BUN 14; Creatinine, Ser 0.95; Potassium 3.3*; Sodium 139    Lipid Panel     Component Value Date/Time   CHOL 230* 06/13/2012 1121   TRIG * 06/13/2012 1121    813.0 Triglyceride is over 400; calculations on Lipids are invalid.   HDL 33.50* 06/13/2012 1121   CHOLHDL 7 06/13/2012 1121   VLDL 162.6* 06/13/2012 1121   LDLCALC  08/04/2009 0350    49        Total Cholesterol/HDL:CHD Risk Coronary Heart Disease Risk Table                     Men   Women  1/2 Average Risk    3.4   3.3  Average Risk       5.0   4.4  2 X Average Risk   9.6   7.1  3 X Average Risk  23.4   11.0        Use the calculated Patient Ratio above and the CHD Risk Table to determine the patient's CHD Risk.        ATP III CLASSIFICATION (LDL):  <100     mg/dL   Optimal  100-129  mg/dL   Near or Above                    Optimal  130-159  mg/dL   Borderline  160-189  mg/dL   High  >190     mg/dL   Very High   LDLDIRECT 85.2 06/13/2012 1121     Wt Readings from Last 3 Encounters:  01/28/15 243 lb 2.7 oz (110.3 kg)  12/13/14 235 lb 7.2 oz (106.8 kg)  12/05/14 235 lb (106.595 kg)      Other studies Reviewed: Additional studies/ records that were reviewed today include: TTE 01/28/14  Review of the above records today demonstrates:  - Left ventricle: The cavity size was normal. Systolic function was moderately reduced. The estimated ejection fraction was in the range of 35% to 40%. Diffuse hypokinesis. - Ventricular septum: Septal motion showed paradox. - Aortic valve: Trileaflet; moderately thickened, moderately calcified leaflets. Valve mobility was restricted. There was no regurgitation. - Mitral valve: Structurally normal valve. There was mild regurgitation. - Right ventricle: The cavity size was normal. Wall thickness was normal. Systolic function was normal. - Tricuspid valve: There was mild regurgitation. - Pulmonic valve: There was no regurgitation. - Pulmonary arteries: Systolic pressure was within the normal range. - Inferior vena cava: The vessel was normal in size. - Pericardium, extracardiac: There was no pericardial effusion.  ASSESSMENT AND PLAN:  1.  Complete heart block s/p pacemaker extract and temp-perm pacemaker placement: Has temp perm system in place after explant of old system.  Currently has rash over torso.  The rash is likely due to medication reaction.  Vancomycin has been stopped and daptomcin has been started.  Agree with the  change in medication.  Should the rash not clear, would search for other causes of allergic reaction.    Current medicines are reviewed at length with the patient today.   The patient does not have concerns regarding his medicines.  The following changes were made today:  none  Labs/ tests ordered today include:  No orders of the defined types were placed in this encounter.    Signed, Emmah Bratcher Meredith Leeds, MD  02/09/2015 3:56 PM     [place] [address] [place] [address] [phone] (office) [phone] (fax)

## 2015-02-12 LAB — BASIC METABOLIC PANEL
BUN: 26 mg/dL — AB (ref 4–21)
Creatinine: 1.1 mg/dL (ref <=1.3)
Glucose: 181 mg/dL
Potassium: 3.9 mmol/L (ref 3.4–5.3)
Sodium: 137 mmol/L (ref 137–147)

## 2015-02-12 LAB — HEPATIC FUNCTION PANEL
ALK PHOS: 151 U/L — AB (ref 25–125)
ALT: 49 U/L — AB (ref 10–40)
AST: 19 U/L (ref 14–40)
Bilirubin, Total: 0.6 mg/dL

## 2015-02-12 LAB — CBC AND DIFFERENTIAL
HEMATOCRIT: 42 % (ref 41–53)
HEMOGLOBIN: 13.5 g/dL (ref 13.5–17.5)
PLATELETS: 182 10*3/uL (ref 150–399)
WBC: 9 10*3/mL

## 2015-02-15 ENCOUNTER — Encounter: Payer: Self-pay | Admitting: Internal Medicine

## 2015-02-15 DIAGNOSIS — F03918 Unspecified dementia, unspecified severity, with other behavioral disturbance: Secondary | ICD-10-CM | POA: Insufficient documentation

## 2015-02-15 DIAGNOSIS — F0391 Unspecified dementia with behavioral disturbance: Secondary | ICD-10-CM

## 2015-02-15 HISTORY — DX: Unspecified dementia with behavioral disturbance: F03.91

## 2015-02-15 HISTORY — DX: Unspecified dementia, unspecified severity, with other behavioral disturbance: F03.918

## 2015-02-15 NOTE — Assessment & Plan Note (Signed)
SNF - Stable- Continue trazodone, Lamictal and as needed Klonopin

## 2015-02-15 NOTE — Assessment & Plan Note (Signed)
status post CABG in 2010: Currently without any chest pain or shortness of breath. Appears stable. SNF - cont ASA and prn NTG

## 2015-02-15 NOTE — Assessment & Plan Note (Signed)
Secondary to above, resolved-back to baseline. CT head on 1/9 negative

## 2015-02-15 NOTE — Assessment & Plan Note (Signed)
SNF - not reported as uncontrolled; cont Lantus 40 u BID and SSI ac and qHS

## 2015-02-15 NOTE — Assessment & Plan Note (Signed)
2/2 prerenal azotemia and sepsis, resolved SNF - follow renal fx with BMP

## 2015-02-15 NOTE — Assessment & Plan Note (Signed)
Sepsis pathophysiology has resolved. 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 Underwent pacemaker extraction, and insertion of a temp-perm pacemaker on 01/28/15. Transthoracic echocardiogram negative for vegetations. Evaluated by infectious disease, recommendations are to continue vancomycin through 02/22/2015. Repeat blood cultures on 01/29/15 negative. Plans are to discharge to SNF on 02/02/15 once PICC line is placed.[name] KitchenSNF -  weekly CBC/CMET/Vanco trough levels and fax results to [facility]; follow up with cardiology and ID.

## 2015-02-15 NOTE — Assessment & Plan Note (Signed)
Transthoracic echo on 01/29/15 showed EF around 35-40%. Clinically compensated. SNF - continue with aspirin.

## 2015-02-15 NOTE — Assessment & Plan Note (Addendum)
SNF - stable and back to baseline;controlled with meds for bipolar

## 2015-02-15 NOTE — Assessment & Plan Note (Signed)
Underwent pacemaker extraction, and insertion of a temp-perm pacemaker on 01/28/15. Transthoracic echocardiogram negative for vegetations. Evaluated by infectious disease, recommendations are to continue vancomycin through 02/22/2015. Repeat blood cultures on 01/29/15 negative. Plans are to discharge to SNF on 02/02/15 once PICC line is placed.[name] KitchenSNF -  check weekly CBC/CMET/Vanco trough levels and fax results to [facility]; follow up with cardiology and ID

## 2015-02-15 NOTE — Assessment & Plan Note (Signed)
Permanent Pacemaker explanted on 01/28/15 due to MRSA bacteremia with pacemaker pocket temp, perm pacemaker remains in place, plans are to reimplant pacemaker once patient has cleared his infection. SNF -  outpatient follow-up with cardiology/EP

## 2015-02-15 NOTE — Assessment & Plan Note (Signed)
SNF - stable, controlled with cardura

## 2015-02-23 ENCOUNTER — Encounter: Payer: Self-pay | Admitting: Gastroenterology

## 2015-02-24 ENCOUNTER — Encounter: Payer: Self-pay | Admitting: Internal Medicine

## 2015-02-24 ENCOUNTER — Ambulatory Visit (INDEPENDENT_AMBULATORY_CARE_PROVIDER_SITE_OTHER): Payer: Medicare Other | Admitting: Internal Medicine

## 2015-02-24 VITALS — BP 115/76 | HR 70 | Temp 98.9°F

## 2015-02-24 DIAGNOSIS — Z23 Encounter for immunization: Secondary | ICD-10-CM

## 2015-02-24 DIAGNOSIS — A4101 Sepsis due to Methicillin susceptible Staphylococcus aureus: Secondary | ICD-10-CM | POA: Diagnosis not present

## 2015-02-24 NOTE — Assessment & Plan Note (Signed)
I'm hopeful that his MRSA bacteremia and pocket infection have been cured but will have him follow-up in 3-4 weeks for repeat blood cultures before having a new permanent pacemaker inserted.

## 2015-02-24 NOTE — Progress Notes (Signed)
[facility] for Infectious Disease  Patient Active Problem List   Diagnosis Date Noted  . Staphylococcus aureus bacteremia with sepsis ([place]) 01/28/2015    Priority: High  . Infection of pacemaker pocket ([place]) 01/28/2015    Priority: High  . Dementia with behavioral disturbance 02/15/2015  . Acute respiratory failure ([place]) 01/26/2015  . AKI (acute kidney injury) ([place]) 01/26/2015  . Acute encephalopathy 01/26/2015  . Altered mental status   . Vitamin D deficiency 01/07/2015  . Mobitz type 2 second degree atrioventricular block 12/12/2014  . Pacemaker 12/12/2014  . Symptomatic bradycardia 12/03/2014  . Lower back pain 06/12/2012  . Right hip pain 06/12/2012  . Intertrigo 05/09/2012  . Insomnia 04/19/2012  . General weakness 03/07/2012  . 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 Diarrhea 01/17/2012  . Pyelonephritis 01/15/2012  . Peripheral neuropathy ([place]) 09/28/2011  . Urinary incontinence 09/28/2011  . Preventative health care 09/23/2011  . Gout 09/23/2011  . Bipolar affective disorder ([place]) 09/23/2011  . TIA (transient ischemic attack) 09/23/2011  . Chronic pain 09/23/2011  . DNR (do not resuscitate) 09/23/2011  . CVA (cerebral infarction) 09/23/2011  . 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 History of alcohol abuse 09/23/2011  . BPH (benign prostatic hypertrophy)   . Left ventricular systolic dysfunction   . Chronic systolic CHF (congestive heart failure) ([place]) 05/05/2011  . Edema 01/03/2011  . CAD (coronary artery disease) 05/18/2010  . Hyperlipidemia 05/18/2010  . Other abnormality of urination(788.69) 11/25/2009  . Other specified forms of chronic ischemic heart disease 05/19/2009  . CAD, ARTERY BYPASS GRAFT 11/18/2008  . AMI, INFERIOR WALL 09/30/2008  . Obstructive sleep apnea 09/16/2008  . SINUS TACHYCARDIA 09/02/2008  . DM (diabetes mellitus), type 2 with peripheral vascular complications ([place]) [id number]  . OBESITY 09/01/2008  .  Hypertensive heart disease with heart failure ([place]) 09/01/2008  . TOBACCO ABUSE 07/31/2007  . 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 Do not exceed 4 gm of acetaminophen in 24 hours   ASPIRIN 81 MG CHEWABLE TABLET    Chew 81 mg by mouth at bedtime.   CLONAZEPAM (KLONOPIN) 0.5 MG TABLET    Take 1 tablet (0.5 mg total) by mouth See admin instructions. Take 1 tablet (0.5 mg) by mouth daily at bedtime, may also take 1 tablet (0.5 mg) 3 times daily as needed for anxiety   DOXAZOSIN (CARDURA) 1 MG TABLET    Take 1 mg by mouth at bedtime.    GABAPENTIN (NEURONTIN) 100 MG CAPSULE    Take 100 mg by mouth at bedtime.   GUAIFENESIN (ROBITUSSIN) 100 MG/5ML SYRUP    Take 100 mg by mouth every 6 (six) hours as needed for cough.    HYDROCODONE-ACETAMINOPHEN (NORCO/VICODIN) 5-325 MG TABLET    Take 1 tablet by mouth every 6 (six) hours as needed for moderate pain.   INSULIN GLARGINE (LANTUS) 100 UNIT/ML INJECTION    Inject 0.4 mLs (40 Units total) into the skin 2 (two) times daily. 10am and 5pm   INSULIN LISPRO (HUMALOG KWIKPEN) 100 UNIT/ML KIWKPEN    Inject 4-10 Units into the skin 4 (four) times daily. Per sliding scale: CBG 201-25 4 units, 251-300 6 units, 301-350 8 units, 351-400 10 units, >400 12 units and call MD (at 6:30am, 11:30am,  4:30 pm, 9pm)   LAMOTRIGINE (LAMICTAL) 100 MG TABLET    Take 100 mg by mouth daily. 10am   LOPERAMIDE (IMODIUM) 2 MG CAPSULE    Take 4 mg by mouth as needed (after each loose stool).    NITROGLYCERIN (NITROSTAT) 0.4 MG SL TABLET    Place 0.4 mg under the tongue every 5 (five) minutes as needed for chest pain.   TRAMADOL (ULTRAM) 50 MG TABLET    Take by mouth every 12 (twelve) hours as needed for moderate pain.   TRAZODONE (DESYREL) 50 MG TABLET    Take 50 mg by mouth at bedtime.     UNABLE TO FIND    Take 25 mg by mouth every 6 (six) hours as needed (itching). Geri-Dryl   VANCOMYCIN 1,500 MG IN SODIUM CHLORIDE 0.9 % 500 ML    Inject 1,500 mg into the vein every 12 (twelve) hours. vancomycin through 02/22/2015   VITAMIN D, ERGOCALCIFEROL, (DRISDOL) 50000 UNITS CAPS CAPSULE    Take 50,000 Units by mouth every 7 (seven) days.  Modified Medications   No medications on file  Discontinued Medications   No medications on file    Subjective: [name] is in for his hospital follow-up visit. He is accompanied by his wife. He was hospitalized last month with MRSA bacteremia and infection of his permanent pacemaker pocket. His pacemaker was removed on 01/28/2015 and he had a temporary left IJ pacer placed. Repeat blood cultures were negative. He had no evidence of endocarditis. He was discharged on IV vancomycin but developed a diffuse pruritic rash and I had him changed to daptomycin on 02/09/2015. He completed 4 weeks of IV antibiotic therapy on 02/21/2014. His PICC line came out inadvertently last night while he was in bed. He is feeling better. His wife notes that he is looking much better. He has had a recent "cold".  Review of Systems: Review of Systems  Constitutional: Negative for fever, chills, weight loss, malaise/fatigue and diaphoresis.  HENT: Positive for congestion. Negative for sore throat.   Respiratory: Positive for cough. Negative for sputum production and shortness of breath.   Cardiovascular: Negative for chest pain.  Gastrointestinal: Negative for nausea, vomiting and diarrhea.  Genitourinary: Negative for dysuria.  Skin: Negative for rash.  Psychiatric/Behavioral: Negative for depression.    Past Medical History  Diagnosis Date  . CAD (coronary artery disease)     a. s/p CABG  . Obesity   . BPH (benign prostatic hypertrophy)   . UNIVERSAL ULCERATIVE COLITIS         . Ischemic cardiomyopathy    . DM         . OBSTRUCTIVE SLEEP APNEA         .  HYPERTENSION         . Hyperlipidemia    . Gout    . Bipolar affective disorder ([place])    . TIA (transient ischemic attack)    . Chronic pain    . CVA (cerebral infarction)      Small left occipital  . Narcolepsy    . History of alcohol abuse    . Depression    . [facility] spotted fever   . Benign neoplasm of colon      Adenomatous polyps  . 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 Smoking status: Former Smoker    Quit date: 01/18/2008  . Smokeless  tobacco: Former Systems developer  . Alcohol Use: No    Family History  Problem Relation Age of Onset  . Diabetes Father   . Heart disease Father   . Heart attack Mother   . Aortic aneurysm Mother   . Alcohol abuse Other   . Arthritis Other   . Hyperlipidemia Other   . Heart disease Other   . Stroke Other   . Hypertension Other   . Diabetes Other   . Mental illness Other   . Alcohol abuse Other   . Arthritis Other   . Heart disease Other   . Mental illness Other   . Diabetes Other     Allergies  Allergen Reactions  . Dexedrine [Dextroamphetamine Sulfate Er] Other (See Comments)    agitation  . Oxycodone Nausea And Vomiting    Objective: Filed Vitals:   02/24/15 1018  BP: 115/76  Pulse: 70  Temp: 98.9 F (37.2 C)  TempSrc: Oral   There is no weight on file to calculate BMI.  Physical Exam  Constitutional: He is oriented to person, place, and time. No distress.  He looks much better and stronger than when I last saw him. He is seated in a wheelchair.  HENT:  Mouth/Throat: No oropharyngeal exudate.  Eyes: Conjunctivae are normal.  Cardiovascular: Normal rate and regular rhythm.   No murmur heard. Pulmonary/Chest: Breath sounds normal.  His left anterior chest incision is healing nicely. He has a temporary pacer wire entering his left IJ vein.  Abdominal: Soft. He exhibits no mass. There is no tenderness.  Neurological: He is alert and  oriented to person, place, and time.  Skin:  His rash is fading. He has some superficial desquamation on his flanks.  Psychiatric: Mood and affect normal.    Lab Results    Problem List Items Addressed This Visit      High   Staphylococcus aureus bacteremia with sepsis ([place])    I'm hopeful that his MRSA bacteremia and pocket infection have been cured but will have him follow-up in 3-4 weeks for repeat blood cultures before having a new permanent pacemaker inserted.       Other Visit Diagnoses    Need for prophylactic vaccination and inoculation against influenza    -  Primary    Relevant Orders    Flu Vaccine QUAD 36+ mos IM (Fluarix & Fluzone Quad PF        Michel Bickers, MD [facility] for [place] [phone] pager   [phone] cell 02/24/2015, 10:59 AM

## 2015-03-06 ENCOUNTER — Encounter: Payer: Self-pay | Admitting: *Deleted

## 2015-03-09 ENCOUNTER — Non-Acute Institutional Stay (SKILLED_NURSING_FACILITY): Payer: Medicare Other | Admitting: Internal Medicine

## 2015-03-09 DIAGNOSIS — R001 Bradycardia, unspecified: Secondary | ICD-10-CM | POA: Diagnosis not present

## 2015-03-10 ENCOUNTER — Encounter: Payer: Self-pay | Admitting: Internal Medicine

## 2015-03-10 ENCOUNTER — Other Ambulatory Visit: Payer: Self-pay | Admitting: Internal Medicine

## 2015-03-10 ENCOUNTER — Telehealth: Payer: Self-pay | Admitting: Internal Medicine

## 2015-03-10 ENCOUNTER — Other Ambulatory Visit: Payer: Medicare Other

## 2015-03-10 ENCOUNTER — Ambulatory Visit (INDEPENDENT_AMBULATORY_CARE_PROVIDER_SITE_OTHER): Payer: Medicare Other | Admitting: Internal Medicine

## 2015-03-10 VITALS — BP 102/50 | HR 70 | Ht 68.5 in

## 2015-03-10 DIAGNOSIS — T827XXD Infection and inflammatory reaction due to other cardiac and vascular devices, implants and grafts, subsequent encounter: Secondary | ICD-10-CM

## 2015-03-10 DIAGNOSIS — A4101 Sepsis due to Methicillin susceptible Staphylococcus aureus: Secondary | ICD-10-CM

## 2015-03-10 NOTE — Telephone Encounter (Signed)
I received a phone call from Dr. Crissie Sickles this morning about [name]. His temporary pacer are has inadvertently been pulled back and is no longer functional. His heart rate is in the 30s and he will need a new permanent pacemaker sudden. I ordered repeat blood cultures today and if they are negative over the next 72 hours it would be reasonable and safe to have a new permanent pacemaker placed.

## 2015-03-10 NOTE — Telephone Encounter (Signed)
Please ask for hall 300 nurse. Needs clarification of consultation notes from today's visit.

## 2015-03-10 NOTE — Progress Notes (Signed)
HPI [name] returns today for followup. He is a pleasant 75 yo man with a h/o complete heart block who underwent PPM insertion and had this complicated by a PPM infection with MRSA. He has undergone device removal and insertion of a temporary perm PM. He has received a full course of anti-biotics and has been aftebrile. He denies chest pain or sob. He has not had syncope. His HR is in the 40/min range. Allergies  Allergen Reactions  . Dexedrine [Dextroamphetamine Sulfate Er] Other (See Comments)    agitation  . Oxycodone Nausea And Vomiting     Current Outpatient Prescriptions  Medication Sig Dispense Refill  . acetaminophen (TYLENOL) 325 MG tablet Take 650 mg by mouth every 4 (four) hours as needed (pain). Do not exceed 4 gm of acetaminophen in 24 hours    . aspirin 81 MG chewable tablet Chew 81 mg by mouth at bedtime.    . clonazePAM (KLONOPIN) 0.5 MG tablet Take 1 tablet (0.5 mg total) by mouth See admin instructions. Take 1 tablet (0.5 mg) by mouth daily at bedtime, may also take 1 tablet (0.5 mg) 3 times daily as needed for anxiety 20 tablet 0  . DAPTOmycin (CUBICIN) 500 MG injection Inject 6 mg into the vein daily.    [name] Kitchen doxazosin (CARDURA) 1 MG tablet Take 1 mg by mouth at bedtime.     . fluticasone (FLONASE) 50 MCG/ACT nasal spray Place 2 sprays into both nostrils daily. For allergies    . gabapentin (NEURONTIN) 100 MG capsule Take 100 mg by mouth at bedtime.    [name] Kitchen guaifenesin (ROBITUSSIN) 100 MG/5ML syrup Take 100 mg by mouth every 6 (six) hours as needed for cough.     [name] Kitchen HYDROcodone-acetaminophen (NORCO/VICODIN) 5-325 MG tablet Take 1 tablet by mouth every 6 (six) hours as needed for moderate pain. 30 tablet 0  . insulin glargine (LANTUS) 100 UNIT/ML injection Inject 0.4 mLs (40 Units total) into the skin 2 (two) times daily. 10am and 5pm    . insulin lispro (HUMALOG KWIKPEN) 100 UNIT/ML KiwkPen Inject 4-10 Units into the skin 4 (four) times daily. Per sliding scale: CBG  201-25 4 units, 251-300 6 units, 301-350 8 units, 351-400 10 units, >400 12 units and call MD (at 6:30am, 11:30am, 4:30 pm, 9pm)    . lamoTRIgine (LAMICTAL) 100 MG tablet Take 100 mg by mouth daily. 10am    . loperamide (IMODIUM) 2 MG capsule Take 4 mg by mouth as needed (after each loose stool).     . nitroGLYCERIN (NITROSTAT) 0.4 MG SL tablet Place 0.4 mg under the tongue every 5 (five) minutes as needed for chest pain.    . traMADol (ULTRAM) 50 MG tablet Take by mouth every 12 (twelve) hours as needed for moderate pain.    . traZODone (DESYREL) 50 MG tablet Take 50 mg by mouth at bedtime.    [name] Kitchen UNABLE TO FIND Take 25 mg by mouth every 6 (six) hours as needed (itching). Geri-Dryl    . vancomycin 1,500 mg in sodium chloride 0.9 % 500 mL Inject 1,500 mg into the vein every 12 (twelve) hours. vancomycin through 02/22/2015    . Vitamin D, Ergocalciferol, (DRISDOL) 50000 units CAPS capsule Take 50,000 Units by mouth every 7 (seven) days.     No current facility-administered medications for this visit.     Past Medical History  Diagnosis Date  . CAD (coronary artery disease)     a. s/p CABG  . Obesity   .  BPH (benign prostatic hypertrophy)   . UNIVERSAL ULCERATIVE COLITIS         . Ischemic cardiomyopathy    . DM         . OBSTRUCTIVE SLEEP APNEA         . HYPERTENSION         . Hyperlipidemia    . Gout    . Bipolar affective disorder ([place])    . TIA (transient ischemic attack)    . Chronic pain    . CVA (cerebral infarction)      Small left occipital  . Narcolepsy    . History of alcohol abuse    . Depression    . [facility] spotted fever   . Benign neoplasm of colon      Adenomatous polyps  . 2Nd degree AV block     a. s/p STJ dual chamber pacemaker 11/2014    ROS:   All systems reviewed and negative except as noted in the HPI.   Past Surgical History  Procedure Laterality Date  . Coronary artery bypass graft      Median sternotomy, extracorporeal circulation,   coronary artery bypass graft surgery x4 using a sequential left internal   mammary artery graft to the mid and distal left anterior descending, a   saphenous vein graft to diagonal branch of the left anterior descending,   and a saphenous vein graft to the obtuse marginal branch of left   circumflex coronary artery.    . Cardiac catheterization      Triple-vessel coronary artery disease. Low-normal left ventricular systolic function with mild   anteroapical wall motion abnormality.   . Tonsilectomy, adenoidectomy, bilateral myringotomy and tubes  1946  . Multiple extractions with alveoloplasty  12/01/2011    Procedure: MULTIPLE EXTRACION WITH ALVEOLOPLASTY;  Surgeon: Lenn Cal, DDS;  Location: [place];  Service: Oral Surgery;  Laterality: N/A;  Extraction of tooth #'s 3,4,5,6,7,8,9,10,11,12,13,14,15,16,17,20,21,22,23,24,25,26,27,28,29,30,31,32 with alveoloplasty and bilateral mandibular lingual tori  . Flexible sigmoidoscopy  01/17/2012    Procedure: FLEXIBLE SIGMOIDOSCOPY;  Surgeon: Lafayette Dragon, MD;  [facility];  Service: Endoscopy;  Laterality: N/A;  . Ep implantable device N/A 12/12/2014    Procedure: Pacemaker Implant;  Surgeon: [name], MD;  Location: [place] [facility];  Service: Cardiovascular;  Laterality: N/A;  . Ep implantable device N/A 01/28/2015    Procedure: PPM Generator Removal;  Surgeon: [name], MD;  Location: [place] [facility];  Service: Cardiovascular;  Laterality: N/A;  . Cardiac catheterization N/A 01/28/2015    Procedure: Temporary Pacemaker;  Surgeon: [name], MD;  Location: [place] [facility];  Service: Cardiovascular;  Laterality: N/A;     Family History  Problem Relation Age of Onset  . Diabetes Father   . Heart disease Father   . Heart attack Mother   . Aortic aneurysm Mother   . Alcohol abuse Other   . Arthritis Other   . Hyperlipidemia Other   . Heart disease Other   . Stroke Other   . Hypertension Other   . Diabetes  Other   . Mental illness Other   . Alcohol abuse Other   . Arthritis Other   . Heart disease Other   . Mental illness Other   . Diabetes Other      Social History   Social History  . Marital Status: Married    Spouse Name: N/A  . Number of Children: 2  . Years of Education:  12   Occupational History  . retired    Social History Main Topics  . Smoking status: Former Smoker    Quit date: 01/18/2008  . Smokeless tobacco: Former Systems developer  . Alcohol Use: No  . Drug Use: No  . Sexual Activity: No   Other Topics Concern  . Not on file   Social History Narrative   Patient has been married for 51 years.   Patient presents with his wife today.    Patient has 2 grown children and lives in [place] and [place] areas respectively.   Patient is a nonsmoker, nondrinker at this time.     BP 102/50 mmHg  Pulse 70  Ht 5' 8.5" (1.74 m)  Wt   Physical Exam:  Obese and diskempt appearing elderly man, NAD HEENT: Unremarkable Neck:  No JVD, no thyromegally, temp-perm PM in left IJ Lymphatics:  No adenopathy Back:  No CVA tenderness Lungs:  Clear with no wheezes HEART:  Regular rate rhythm, no murmurs, no rubs, no clicks Abd:  soft, obese, positive bowel sounds, no organomegally, no rebound, no guarding Ext:  2 plus pulses, no edema, no cyanosis, no clubbing Skin:  No rashes no nodules Neuro:  CN II through XII intact, motor grossly intact   DEVICE - no capture with high output pacing  Assess/Plan: 1. PPM system infection with MRSA - I have discussed the situation with Dr. Megan Salon. He will undergo repeat blood cultures today or tomorrow and we will plan to have him undergo PPM system re-implantation 2. Complete heart block - he has an escape rhythm of 35-40 today. He is very sedentary but appears to be asymptomatic 3. Obesity - he is encouraged to lose. 4. Skin - his wife tells me he has a sore on his rear end. We will need to look at this prior to insertion of a new  device.  [name].D.

## 2015-03-10 NOTE — Telephone Encounter (Signed)
Spoke with RN at facility and gave her the instructions for his PPM implant scheduled for 3/3.  This is all pending negative blood cultures drawn today by Dr Megan Salon.

## 2015-03-10 NOTE — Patient Instructions (Signed)
Medication Instructions:  Your physician recommends that you continue on your current medications as directed. Please refer to the Current Medication list given to you today.   Labwork: Your physician recommends that you return for lab work today: at infectious disease MD's office and the day of procedure at the hospital per Dr Lovena Le     Testing/Procedures: Your physician has recommended that you have a pacemaker inserted. A pacemaker is a small device that is placed under the skin of your chest or abdomen to help control abnormal heart rhythms. This device uses electrical pulses to prompt the heart to beat at a normal rate. Pacemakers are used to treat heart rhythms that are too slow. Wire (leads) are attached to the pacemaker that goes into the chambers of you heart. This is done in the hospital and usually requires and overnight stay. Please see the instruction sheet given to you today for more information.  Please arrive at the [facility] at 9:30am  Do not eat or drink after midnight the night before your procedure    Follow-Up: Your physician recommends that you schedule a follow-up appointment in: 10-14 days from 03/20/15 with device clinic for wound check and 3 months from 03/20/15 with Dr Lovena Le   Any Other Special Instructions Will Be Listed Below (If Applicable).     If you need a refill on your cardiac medications before your next appointment, please call your pharmacy.

## 2015-03-14 ENCOUNTER — Encounter: Payer: Self-pay | Admitting: Internal Medicine

## 2015-03-14 DIAGNOSIS — R001 Bradycardia, unspecified: Secondary | ICD-10-CM | POA: Insufficient documentation

## 2015-03-14 NOTE — Progress Notes (Signed)
MRN: [id number] Name: [name]  Sex: male Age: 75 y.o. DOB: Nov 21, 1940  [place] #: [facility] Facility/Room: Level Of Care: SNF Provider: Inocencio Homes D Emergency Contacts: Extended Emergency Contact Information Primary Emergency Contact: Fontanilla,[name] Address: [address]          [place], [place] [address] [name] of [place] Phone: [phone] Mobile Phone: [phone] Relation: Spouse Secondary Emergency Contact: [name], Leslie [name] of [place] Phone: [phone] Mobile Phone: [phone] Relation: Daughter  Code Status:   Allergies: Dexedrine and Oxycodone  Chief Complaint  Patient presents with  . Acute Visit    HPI: Patient is 75 y.o. male who I hear from nursing pt  has had a HR in the 30's over the weekend. Pt is s/p pacemaker insertion with pocket infection and now bradycardic again. Per pt he denies CP, SOB. He is a little tired. Per nursing he is acting his usual self. Nothing makes it better or worse.  Past Medical History  Diagnosis Date  . CAD (coronary artery disease)     a. s/p CABG  . Obesity   . BPH (benign prostatic hypertrophy)   . UNIVERSAL ULCERATIVE COLITIS         . Ischemic cardiomyopathy    . DM         . OBSTRUCTIVE SLEEP APNEA         . HYPERTENSION         . Hyperlipidemia    . Gout    . Bipolar affective disorder ([place])    . TIA (transient ischemic attack)    . Chronic pain    . CVA (cerebral infarction)      Small left occipital  . Narcolepsy    . History of alcohol abuse    . Depression    . [facility] spotted fever   . Benign neoplasm of colon      Adenomatous polyps  . 2Nd degree AV block     a. s/p STJ dual chamber pacemaker 11/2014    Past Surgical History  Procedure Laterality Date  . Coronary artery bypass graft      Median sternotomy, extracorporeal circulation,  coronary artery bypass graft surgery x4 using a sequential left internal   mammary artery  graft to the mid and distal left anterior descending, a   saphenous vein graft to diagonal branch of the left anterior descending,   and a saphenous vein graft to the obtuse marginal branch of left   circumflex coronary artery.    . Cardiac catheterization      Triple-vessel coronary artery disease. Low-normal left ventricular systolic function with mild   anteroapical wall motion abnormality.   . Tonsilectomy, adenoidectomy, bilateral myringotomy and tubes  1946  . Multiple extractions with alveoloplasty  12/01/2011    Procedure: MULTIPLE EXTRACION WITH ALVEOLOPLASTY;  Surgeon: Lenn Cal, DDS;  Location: [place];  Service: Oral Surgery;  Laterality: N/A;  Extraction of tooth #'s 3,4,5,6,7,8,9,10,11,12,13,14,15,16,17,20,21,22,23,24,25,26,27,28,29,30,31,32 with alveoloplasty and bilateral mandibular lingual tori  . Flexible sigmoidoscopy  01/17/2012    Procedure: FLEXIBLE SIGMOIDOSCOPY;  Surgeon: Lafayette Dragon, MD;  [facility];  Service: Endoscopy;  Laterality: N/A;  . Ep implantable device N/A 12/12/2014    Procedure: Pacemaker Implant;  Surgeon: [name], MD;  Location: [place] [facility];  Service: Cardiovascular;  Laterality: N/A;  . Ep implantable device N/A 01/28/2015    Procedure: PPM Generator Removal;  Surgeon: [name], MD;  Location: [place] [facility];  Service: Cardiovascular;  Laterality: N/A;  . Cardiac catheterization N/A 01/28/2015    Procedure: Temporary Pacemaker;  Surgeon: [name], MD;  Location: [place] [facility];  Service: Cardiovascular;  Laterality: N/A;      Medication List       This list is accurate as of: 03/09/15 11:59 PM.  Always use your most recent med list.               acetaminophen 325 MG tablet  Commonly known as:  TYLENOL  Take 650 mg by mouth every 4 (four) hours as needed (pain). Do not exceed 4 gm of acetaminophen in 24 hours     aspirin 81 MG chewable tablet  Chew 81 mg by mouth at bedtime.     clonazePAM 0.5  MG tablet  Commonly known as:  KLONOPIN  Take 1 tablet (0.5 mg total) by mouth See admin instructions. Take 1 tablet (0.5 mg) by mouth daily at bedtime, may also take 1 tablet (0.5 mg) 3 times daily as needed for anxiety     DAPTOmycin 500 MG injection  Commonly known as:  CUBICIN  Inject 6 mg into the vein daily.     doxazosin 1 MG tablet  Commonly known as:  CARDURA  Take 1 mg by mouth at bedtime.     fluticasone 50 MCG/ACT nasal spray  Commonly known as:  FLONASE  Place 2 sprays into both nostrils daily. For allergies     gabapentin 100 MG capsule  Commonly known as:  NEURONTIN  Take 100 mg by mouth at bedtime.     guaifenesin 100 MG/5ML syrup  Commonly known as:  ROBITUSSIN  Take 100 mg by mouth every 6 (six) hours as needed for cough.     HUMALOG KWIKPEN 100 UNIT/ML KiwkPen  Generic drug:  insulin lispro  Inject 4-10 Units into the skin 4 (four) times daily. Per sliding scale: CBG 201-25 4 units, 251-300 6 units, 301-350 8 units, 351-400 10 units, >400 12 units and call MD (at 6:30am, 11:30am, 4:30 pm, 9pm)     HYDROcodone-acetaminophen 5-325 MG tablet  Commonly known as:  NORCO/VICODIN  Take 1 tablet by mouth every 6 (six) hours as needed for moderate pain.     insulin glargine 100 UNIT/ML injection  Commonly known as:  LANTUS  Inject 0.4 mLs (40 Units total) into the skin 2 (two) times daily. 10am and 5pm     lamoTRIgine 100 MG tablet  Commonly known as:  LAMICTAL  Take 100 mg by mouth daily. 10am     loperamide 2 MG capsule  Commonly known as:  IMODIUM  Take 4 mg by mouth as needed (after each loose stool).     nitroGLYCERIN 0.4 MG SL tablet  Commonly known as:  NITROSTAT  Place 0.4 mg under the tongue every 5 (five) minutes as needed for chest pain.     traMADol 50 MG tablet  Commonly known as:  ULTRAM  Take by mouth every 12 (twelve) hours as needed for moderate pain.     traZODone 50 MG tablet  Commonly known as:  DESYREL  Take 50 mg by mouth at  bedtime.     UNABLE TO FIND  Take 25 mg by mouth every 6 (six) hours as needed (itching). Geri-Dryl     vancomycin 1,500 mg in sodium chloride 0.9 % 500 mL  Inject 1,500 mg into the vein every 12 (twelve)  hours. vancomycin through 02/22/2015     Vitamin D (Ergocalciferol) 50000 units Caps capsule  Commonly known as:  DRISDOL  Take 50,000 Units by mouth every 7 (seven) days.        No orders of the defined types were placed in this encounter.    There is no immunization history for the selected administration types on file for this patient.  Social History  Substance Use Topics  . Smoking status: Former Smoker    Quit date: 01/18/2008  . Smokeless tobacco: Former Systems developer  . Alcohol Use: No    Review of Systems  DATA OBTAINED: from patient, nurse- as per HPI GENERAL:  no fevers,+ fatigue, appetite changes SKIN: No itching, rash HEENT: No complaint RESPIRATORY: No cough, wheezing, SOB CARDIAC: No chest pain, palpitations, lower extremity edema  GI: No abdominal pain, No N/V/D or constipation, No heartburn or reflux  GU: No dysuria, frequency or urgency, or incontinence  MUSCULOSKELETAL: No unrelieved bone/joint pain NEUROLOGIC: No headache, dizziness  PSYCHIATRIC: No overt anxiety or sadness  Filed Vitals:   03/14/15 1629  BP: 118/64  Pulse: 38  Temp: 97.3 F (36.3 C)  Resp: 20    Physical Exam  GENERAL APPEARANCE: Alert, conversant, No acute distress  SKIN: No diaphoresis rash L chest wall and neck without signs of infection HEENT: Unremarkable RESPIRATORY: Breathing is even, unlabored. Lung sounds are clear   CARDIOVASCULAR: Heart slow HR no murmurs, rubs or gallops.  Trace peripheral edema  GASTROINTESTINAL: Abdomen is soft, non-tender, not distended w/ normal bowel sounds.  GENITOURINARY: Bladder non tender, not distended  MUSCULOSKELETAL: No abnormal joints or musculature NEUROLOGIC: Cranial nerves 2-12 grossly intact. Moves all extremities PSYCHIATRIC:  Mood and affect appropriate to situation, no behavioral issues  Patient Active Problem List   Diagnosis Date Noted  . Bradycardia 03/14/2015  . Dementia with behavioral disturbance 02/15/2015  . Staphylococcus aureus bacteremia with sepsis ([place]) 01/28/2015  . Infection of pacemaker pocket ([place]) 01/28/2015  . Acute respiratory failure ([place]) 01/26/2015  . AKI (acute kidney injury) ([place]) 01/26/2015  . Acute encephalopathy 01/26/2015  . Altered mental status   . Vitamin D deficiency 01/07/2015  . Mobitz type 2 second degree atrioventricular block 12/12/2014  . Pacemaker 12/12/2014  . Symptomatic bradycardia 12/03/2014  . Lower back pain 06/12/2012  . Right hip pain 06/12/2012  . Intertrigo 05/09/2012  . Insomnia 04/19/2012  . General weakness 03/07/2012  . 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 Diarrhea 01/17/2012  . Pyelonephritis 01/15/2012  . Peripheral neuropathy ([place]) 09/28/2011  . Urinary incontinence 09/28/2011  . Preventative health care 09/23/2011  . Gout 09/23/2011  . Bipolar affective disorder ([place]) 09/23/2011  . TIA (transient ischemic attack) 09/23/2011  . Chronic pain 09/23/2011  . DNR (do not resuscitate) 09/23/2011  . CVA (cerebral infarction) 09/23/2011  . 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 History of alcohol abuse 09/23/2011  . BPH (benign prostatic hypertrophy)   . Left ventricular systolic dysfunction   . Chronic systolic CHF (congestive heart failure) ([place]) 05/05/2011  . Edema 01/03/2011  . CAD (coronary artery disease) 05/18/2010  . Hyperlipidemia 05/18/2010  . Other abnormality of urination(788.69) 11/25/2009  . Other specified forms of chronic ischemic heart disease 05/19/2009  . CAD, ARTERY BYPASS GRAFT 11/18/2008  . AMI, INFERIOR WALL 09/30/2008  . Obstructive sleep apnea 09/16/2008  . SINUS TACHYCARDIA 09/02/2008  . DM (diabetes mellitus), type 2 with peripheral vascular complications ([place]) [id number]  . OBESITY 09/01/2008  .  Hypertensive heart disease with heart failure ([place])  09/01/2008  . TOBACCO ABUSE 07/31/2007  . 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    CBC    Component Value Date/Time   WBC 9.0 02/12/2015   WBC 6.4 02/01/2015 0245   RBC 4.33 02/01/2015 0245   HGB 13.5 02/12/2015   HCT 42 02/12/2015   PLT 182 02/12/2015   MCV 90.3 02/01/2015 0245   LYMPHSABS 0.7 01/26/2015 1200   MONOABS 0.9 01/26/2015 1200   EOSABS 0.0 01/26/2015 1200   BASOSABS 0.0 01/26/2015 1200    CMP     Component Value Date/Time   NA 137 02/12/2015   NA 139 02/02/2015 0539   K 3.9 02/12/2015   CL 107 02/02/2015 0539   CO2 26 02/02/2015 0539   GLUCOSE 188* 02/02/2015 0539   BUN 26* 02/12/2015   BUN 14 02/02/2015 0539   CREATININE 1.1 02/12/2015   CREATININE 0.95 02/02/2015 0539   CREATININE 1.06 12/05/2014 1536   CALCIUM 9.3 02/02/2015 0539   PROT 5.8* 02/01/2015 0245   ALBUMIN 2.2* 02/01/2015 0245   AST 19 02/12/2015   ALT 49* 02/12/2015   ALKPHOS 151* 02/12/2015   BILITOT 0.4 02/01/2015 0245   GFRNONAA >60 02/02/2015 0539   GFRAA >60 02/02/2015 0539    Assessment and Plan  Bradycardia Probably a junctional escape rhythm;pt is tolerating it with normal mentation, clear lungs, baseline edema so he does not ned to go to ED. He does need to see Cards within next day ideally. Will monitor for signs/sx of decompensation    [name], MD

## 2015-03-14 NOTE — Assessment & Plan Note (Signed)
Probably a junctional escape rhythm;pt is tolerating it with normal mentation, clear lungs, baseline edema so he does not ned to go to ED. He does need to see Cards within next day ideally. Will monitor for signs/sx of decompensation

## 2015-03-16 ENCOUNTER — Encounter: Payer: Medicare Other | Admitting: Internal Medicine

## 2015-03-17 ENCOUNTER — Encounter: Payer: Self-pay | Admitting: Internal Medicine

## 2015-03-17 LAB — CULTURE, BLOOD (SINGLE)
Organism ID, Bacteria: NO GROWTH
Organism ID, Bacteria: NO GROWTH

## 2015-03-18 ENCOUNTER — Telehealth: Payer: Self-pay | Admitting: Internal Medicine

## 2015-03-18 NOTE — Telephone Encounter (Signed)
Per Dr Lovena Le:  NPO after MN on 03/19/15 Okay to take medications the morning of the procedure with SMALL sip of water No Insulin morning of procedure

## 2015-03-18 NOTE — Telephone Encounter (Signed)
New message      Pt is due to have a procedure Friday.  Please fax the pre-op order so that can hold meds if necessary

## 2015-03-19 ENCOUNTER — Other Ambulatory Visit: Payer: Self-pay | Admitting: Physician Assistant

## 2015-03-20 ENCOUNTER — Encounter (HOSPITAL_COMMUNITY)
Admission: RE | Disposition: A | Discharge status: Skilled Nursing Facility | Payer: Self-pay | Source: Ambulatory Visit | Attending: Internal Medicine

## 2015-03-20 ENCOUNTER — Ambulatory Visit (HOSPITAL_COMMUNITY)
Admission: RE | Admit: 2015-03-20 | Discharge: 2015-03-21 | Disposition: A | Discharge status: Skilled Nursing Facility | Payer: Medicare Other | Source: Ambulatory Visit | Attending: Internal Medicine | Admitting: Internal Medicine

## 2015-03-20 ENCOUNTER — Encounter (HOSPITAL_COMMUNITY): Payer: Self-pay | Admitting: Internal Medicine

## 2015-03-20 DIAGNOSIS — M109 Gout, unspecified: Secondary | ICD-10-CM | POA: Diagnosis not present

## 2015-03-20 DIAGNOSIS — F319 Bipolar disorder, unspecified: Secondary | ICD-10-CM | POA: Insufficient documentation

## 2015-03-20 DIAGNOSIS — Z8249 Family history of ischemic heart disease and other diseases of the circulatory system: Secondary | ICD-10-CM | POA: Insufficient documentation

## 2015-03-20 DIAGNOSIS — Z8673 Personal history of transient ischemic attack (TIA), and cerebral infarction without residual deficits: Secondary | ICD-10-CM | POA: Diagnosis not present

## 2015-03-20 DIAGNOSIS — Z951 Presence of aortocoronary bypass graft: Secondary | ICD-10-CM | POA: Insufficient documentation

## 2015-03-20 DIAGNOSIS — I2510 Atherosclerotic heart disease of native coronary artery without angina pectoris: Secondary | ICD-10-CM | POA: Insufficient documentation

## 2015-03-20 DIAGNOSIS — E119 Type 2 diabetes mellitus without complications: Secondary | ICD-10-CM | POA: Insufficient documentation

## 2015-03-20 DIAGNOSIS — E669 Obesity, unspecified: Secondary | ICD-10-CM | POA: Diagnosis not present

## 2015-03-20 DIAGNOSIS — Z794 Long term (current) use of insulin: Secondary | ICD-10-CM | POA: Diagnosis not present

## 2015-03-20 DIAGNOSIS — Z7982 Long term (current) use of aspirin: Secondary | ICD-10-CM | POA: Diagnosis not present

## 2015-03-20 DIAGNOSIS — I442 Atrioventricular block, complete: Secondary | ICD-10-CM | POA: Diagnosis not present

## 2015-03-20 DIAGNOSIS — N400 Enlarged prostate without lower urinary tract symptoms: Secondary | ICD-10-CM | POA: Insufficient documentation

## 2015-03-20 DIAGNOSIS — I110 Hypertensive heart disease with heart failure: Secondary | ICD-10-CM | POA: Insufficient documentation

## 2015-03-20 DIAGNOSIS — I5022 Chronic systolic (congestive) heart failure: Secondary | ICD-10-CM | POA: Diagnosis not present

## 2015-03-20 DIAGNOSIS — I255 Ischemic cardiomyopathy: Secondary | ICD-10-CM | POA: Diagnosis not present

## 2015-03-20 DIAGNOSIS — Z87891 Personal history of nicotine dependence: Secondary | ICD-10-CM | POA: Diagnosis not present

## 2015-03-20 DIAGNOSIS — Z8614 Personal history of Methicillin resistant Staphylococcus aureus infection: Secondary | ICD-10-CM | POA: Insufficient documentation

## 2015-03-20 DIAGNOSIS — E785 Hyperlipidemia, unspecified: Secondary | ICD-10-CM | POA: Insufficient documentation

## 2015-03-20 DIAGNOSIS — I441 Atrioventricular block, second degree: Secondary | ICD-10-CM | POA: Diagnosis present

## 2015-03-20 DIAGNOSIS — Z6834 Body mass index (BMI) 34.0-34.9, adult: Secondary | ICD-10-CM | POA: Insufficient documentation

## 2015-03-20 DIAGNOSIS — G47419 Narcolepsy without cataplexy: Secondary | ICD-10-CM | POA: Diagnosis not present

## 2015-03-20 DIAGNOSIS — G8929 Other chronic pain: Secondary | ICD-10-CM | POA: Diagnosis not present

## 2015-03-20 DIAGNOSIS — Z950 Presence of cardiac pacemaker: Secondary | ICD-10-CM | POA: Diagnosis present

## 2015-03-20 DIAGNOSIS — G4733 Obstructive sleep apnea (adult) (pediatric): Secondary | ICD-10-CM | POA: Insufficient documentation

## 2015-03-20 DIAGNOSIS — R001 Bradycardia, unspecified: Secondary | ICD-10-CM | POA: Diagnosis not present

## 2015-03-20 HISTORY — PX: EP IMPLANTABLE DEVICE: SHX172B

## 2015-03-20 LAB — BASIC METABOLIC PANEL
ANION GAP: 10 (ref 5–15)
BUN: 23 mg/dL — ABNORMAL HIGH (ref 6–20)
CO2: 28 mmol/L (ref 22–32)
Calcium: 9.6 mg/dL (ref 8.9–10.3)
Chloride: 102 mmol/L (ref 101–111)
Creatinine, Ser: 1.23 mg/dL (ref 0.61–1.24)
GFR, EST NON AFRICAN AMERICAN: 55 mL/min — AB (ref >60)
GLUCOSE: 171 mg/dL — AB (ref 65–99)
POTASSIUM: 4.1 mmol/L (ref 3.5–5.1)
SODIUM: 140 mmol/L (ref 135–145)

## 2015-03-20 LAB — GLUCOSE, CAPILLARY
GLUCOSE-CAPILLARY: 136 mg/dL — AB (ref 65–99)
GLUCOSE-CAPILLARY: 159 mg/dL — AB (ref 65–99)
GLUCOSE-CAPILLARY: 124 mg/dL — AB (ref 65–99)
GLUCOSE-CAPILLARY: 199 mg/dL — AB (ref 65–99)

## 2015-03-20 LAB — CBC
HCT: 40.6 % (ref 39.0–52.0)
Hemoglobin: 13.2 g/dL (ref 13.0–17.0)
MCH: 29.8 pg (ref 26.0–34.0)
MCHC: 32.5 g/dL (ref 30.0–36.0)
MCV: 91.6 fL (ref 78.0–100.0)
PLATELETS: 175 10*3/uL (ref 150–400)
RBC: 4.43 MIL/uL (ref 4.22–5.81)
RDW: 13.7 % (ref 11.5–15.5)
WBC: 6.8 10*3/uL (ref 4.0–10.5)

## 2015-03-20 LAB — SURGICAL PCR SCREEN
MRSA, PCR: POSITIVE — AB
Staphylococcus aureus: POSITIVE — AB

## 2015-03-20 SURGERY — PACEMAKER IMPLANT
Anesthesia: LOCAL

## 2015-03-20 MED ORDER — CHLORHEXIDINE GLUCONATE 4 % EX LIQD
60.0000 mL | Freq: Once | CUTANEOUS | Status: DC
  Filled 2015-03-20: qty 60

## 2015-03-20 MED ORDER — VITAMIN D (ERGOCALCIFEROL) 1.25 MG (50000 UNIT) PO CAPS: 50000.0000 [IU] | ORAL_CAPSULE | ORAL | Status: DC

## 2015-03-20 MED ORDER — ONDANSETRON HCL 4 MG/2ML IJ SOLN: 4.0000 mg | Freq: Four times a day (QID) | INTRAMUSCULAR | Status: DC | PRN

## 2015-03-20 MED ORDER — MUPIROCIN 2 % EX OINT
TOPICAL_OINTMENT | CUTANEOUS | Status: AC
  Administered 2015-03-20: 1 via TOPICAL
  Filled 2015-03-20: qty 22

## 2015-03-20 MED ORDER — CHLORHEXIDINE GLUCONATE CLOTH 2 % EX PADS
6.0000 | MEDICATED_PAD | Freq: Every day | CUTANEOUS | Status: DC
  Administered 2015-03-21: 08:00:00 6 via TOPICAL

## 2015-03-20 MED ORDER — FLUTICASONE PROPIONATE 50 MCG/ACT NA SUSP
2.0000 | Freq: Every day | NASAL | Status: DC
  Administered 2015-03-20 – 2015-03-21 (×2): 2 via NASAL
  Filled 2015-03-20: qty 16

## 2015-03-20 MED ORDER — IOHEXOL 350 MG/ML SOLN
INTRAVENOUS | Status: DC | PRN
  Administered 2015-03-20: 15 mL via INTRAVENOUS

## 2015-03-20 MED ORDER — SODIUM CHLORIDE 0.9% FLUSH: 3.0000 mL | INTRAVENOUS | Status: DC | PRN

## 2015-03-20 MED ORDER — INSULIN ASPART 100 UNIT/ML ~~LOC~~ SOLN
0.0000 [IU] | Freq: Three times a day (TID) | SUBCUTANEOUS | Status: DC
Start: 2015-03-20 — End: 2015-03-21
  Administered 2015-03-20 – 2015-03-21 (×2): 1 [IU] via SUBCUTANEOUS

## 2015-03-20 MED ORDER — LIDOCAINE HCL (PF) 1 % IJ SOLN
INTRAMUSCULAR | Status: AC
  Filled 2015-03-20: qty 30

## 2015-03-20 MED ORDER — PRO-STAT SUGAR FREE PO LIQD
30.0000 mL | Freq: Three times a day (TID) | ORAL | Status: DC
  Administered 2015-03-20 – 2015-03-21 (×2): 30 mL via ORAL
  Filled 2015-03-20: qty 30

## 2015-03-20 MED ORDER — VANCOMYCIN HCL IN DEXTROSE 1-5 GM/200ML-% IV SOLN: 1000.0000 mg | Freq: Two times a day (BID) | INTRAVENOUS | Status: DC

## 2015-03-20 MED ORDER — FENTANYL CITRATE (PF) 100 MCG/2ML IJ SOLN
INTRAMUSCULAR | Status: AC
  Filled 2015-03-20: qty 2

## 2015-03-20 MED ORDER — SODIUM CHLORIDE 0.9% FLUSH: 3.0000 mL | Freq: Two times a day (BID) | INTRAVENOUS | Status: DC

## 2015-03-20 MED ORDER — INSULIN GLARGINE 100 UNIT/ML ~~LOC~~ SOLN
45.0000 [IU] | Freq: Two times a day (BID) | SUBCUTANEOUS | Status: DC
  Administered 2015-03-20 – 2015-03-21 (×2): 45 [IU] via SUBCUTANEOUS
  Filled 2015-03-20 (×3): qty 0.45

## 2015-03-20 MED ORDER — ACETAMINOPHEN 325 MG PO TABS: 650.0000 mg | ORAL_TABLET | ORAL | Status: DC | PRN

## 2015-03-20 MED ORDER — GUAIFENESIN 100 MG/5ML PO SYRP
100.0000 mg | ORAL_SOLUTION | Freq: Three times a day (TID) | ORAL | Status: DC | PRN
  Filled 2015-03-20: qty 5

## 2015-03-20 MED ORDER — CEFAZOLIN SODIUM-DEXTROSE 2-3 GM-% IV SOLR: 2.0000 g | INTRAVENOUS | Status: DC

## 2015-03-20 MED ORDER — HEPARIN (PORCINE) IN NACL 2-0.9 UNIT/ML-% IJ SOLN
INTRAMUSCULAR | Status: DC | PRN
  Administered 2015-03-20: 500 mL

## 2015-03-20 MED ORDER — DOXAZOSIN MESYLATE 1 MG PO TABS
1.0000 mg | ORAL_TABLET | Freq: Every day | ORAL | Status: DC
  Administered 2015-03-20: 1 mg via ORAL
  Filled 2015-03-20: qty 1

## 2015-03-20 MED ORDER — CLONAZEPAM 0.5 MG PO TABS
0.5000 mg | ORAL_TABLET | Freq: Every day | ORAL | Status: DC
  Administered 2015-03-20: 0.5 mg via ORAL
  Filled 2015-03-20: qty 1

## 2015-03-20 MED ORDER — TRAZODONE HCL 50 MG PO TABS
50.0000 mg | ORAL_TABLET | Freq: Every day | ORAL | Status: DC
  Administered 2015-03-20: 50 mg via ORAL
  Filled 2015-03-20: qty 1

## 2015-03-20 MED ORDER — LAMOTRIGINE 100 MG PO TABS
100.0000 mg | ORAL_TABLET | Freq: Every day | ORAL | Status: DC
  Administered 2015-03-20 – 2015-03-21 (×2): 100 mg via ORAL
  Filled 2015-03-20 (×3): qty 1

## 2015-03-20 MED ORDER — MIDAZOLAM HCL 5 MG/5ML IJ SOLN
INTRAMUSCULAR | Status: DC | PRN
  Administered 2015-03-20 (×3): 1 mg via INTRAVENOUS

## 2015-03-20 MED ORDER — CEFAZOLIN SODIUM-DEXTROSE 2-3 GM-% IV SOLR
INTRAVENOUS | Status: AC
  Filled 2015-03-20: qty 50

## 2015-03-20 MED ORDER — LIDOCAINE HCL (PF) 1 % IJ SOLN
INTRAMUSCULAR | Status: DC | PRN
  Administered 2015-03-20: 30 mL

## 2015-03-20 MED ORDER — MAGNESIUM OXIDE 400 (241.3 MG) MG PO TABS
400.0000 mg | ORAL_TABLET | Freq: Every day | ORAL | Status: DC
  Administered 2015-03-20 – 2015-03-21 (×2): 400 mg via ORAL
  Filled 2015-03-20 (×2): qty 1

## 2015-03-20 MED ORDER — CLONAZEPAM 0.5 MG PO TABS: 0.5000 mg | ORAL_TABLET | ORAL | Status: DC

## 2015-03-20 MED ORDER — MIDAZOLAM HCL 5 MG/5ML IJ SOLN
INTRAMUSCULAR | Status: AC
  Filled 2015-03-20: qty 5

## 2015-03-20 MED ORDER — CLONAZEPAM 0.5 MG PO TABS: 0.5000 mg | ORAL_TABLET | Freq: Three times a day (TID) | ORAL | Status: DC | PRN

## 2015-03-20 MED ORDER — SODIUM CHLORIDE 0.9 % IV SOLN
INTRAVENOUS | Status: DC
  Administered 2015-03-20: 11:00:00 via INTRAVENOUS

## 2015-03-20 MED ORDER — SODIUM CHLORIDE 0.9 % IV SOLN: 250.0000 mL | INTRAVENOUS | Status: DC

## 2015-03-20 MED ORDER — VANCOMYCIN HCL 1000 MG IV SOLR
1000.0000 mg | INTRAVENOUS | Status: DC | PRN
  Administered 2015-03-20: 1000 mg via INTRAVENOUS

## 2015-03-20 MED ORDER — GABAPENTIN 100 MG PO CAPS
100.0000 mg | ORAL_CAPSULE | Freq: Every day | ORAL | Status: DC
  Administered 2015-03-20: 22:00:00 100 mg via ORAL
  Filled 2015-03-20: qty 1

## 2015-03-20 MED ORDER — SODIUM CHLORIDE 0.9 % IR SOLN
Status: AC
  Filled 2015-03-20: qty 2

## 2015-03-20 MED ORDER — CEFAZOLIN SODIUM-DEXTROSE 2-3 GM-% IV SOLR
INTRAVENOUS | Status: DC | PRN
  Administered 2015-03-20 (×2): 2 g via INTRAVENOUS

## 2015-03-20 MED ORDER — MUPIROCIN 2 % EX OINT
1.0000 "application " | TOPICAL_OINTMENT | Freq: Once | CUTANEOUS | Status: AC
  Administered 2015-03-20: 1 via TOPICAL

## 2015-03-20 MED ORDER — VANCOMYCIN HCL IN DEXTROSE 1-5 GM/200ML-% IV SOLN
1000.0000 mg | Freq: Once | INTRAVENOUS | Status: DC
  Filled 2015-03-20: qty 200

## 2015-03-20 MED ORDER — FENTANYL CITRATE (PF) 100 MCG/2ML IJ SOLN
INTRAMUSCULAR | Status: DC | PRN
  Administered 2015-03-20 (×2): 25 ug via INTRAVENOUS

## 2015-03-20 MED ORDER — INSULIN LISPRO 100 UNIT/ML (KWIKPEN)
4.0000 [IU] | PEN_INJECTOR | Freq: Four times a day (QID) | SUBCUTANEOUS | Status: DC
Start: 2015-03-20 — End: 2015-03-20

## 2015-03-20 MED ORDER — ACETAMINOPHEN 325 MG PO TABS: 325.0000 mg | ORAL_TABLET | ORAL | Status: DC | PRN

## 2015-03-20 MED ORDER — LOPERAMIDE HCL 2 MG PO CAPS: 4.0000 mg | ORAL_CAPSULE | ORAL | Status: DC | PRN

## 2015-03-20 MED ORDER — INSULIN ASPART 100 UNIT/ML ~~LOC~~ SOLN
0.0000 [IU] | Freq: Every day | SUBCUTANEOUS | Status: DC
Start: 2015-03-20 — End: 2015-03-21

## 2015-03-20 MED ORDER — NITROGLYCERIN 0.4 MG SL SUBL: 0.4000 mg | SUBLINGUAL_TABLET | SUBLINGUAL | Status: DC | PRN

## 2015-03-20 MED ORDER — MUPIROCIN 2 % EX OINT
1.0000 "application " | TOPICAL_OINTMENT | Freq: Two times a day (BID) | CUTANEOUS | Status: DC
  Administered 2015-03-20 – 2015-03-21 (×2): 1 via NASAL
  Filled 2015-03-20: qty 22

## 2015-03-20 MED ORDER — SODIUM CHLORIDE 0.9 % IR SOLN
80.0000 mg | Status: AC
  Administered 2015-03-20: 80 mg

## 2015-03-20 MED ORDER — VANCOMYCIN HCL IN DEXTROSE 1-5 GM/200ML-% IV SOLN
1000.0000 mg | Freq: Two times a day (BID) | INTRAVENOUS | Status: AC
  Administered 2015-03-20: 23:00:00 1000 mg via INTRAVENOUS
  Filled 2015-03-20: qty 200

## 2015-03-20 MED ORDER — HEPARIN (PORCINE) IN NACL 2-0.9 UNIT/ML-% IJ SOLN
INTRAMUSCULAR | Status: AC
  Filled 2015-03-20: qty 500

## 2015-03-20 MED ORDER — ASPIRIN 81 MG PO CHEW
81.0000 mg | CHEWABLE_TABLET | Freq: Every day | ORAL | Status: DC
  Administered 2015-03-20: 81 mg via ORAL
  Filled 2015-03-20: qty 1

## 2015-03-20 SURGICAL SUPPLY — 8 items
CABLE SURGICAL S-101-97-12 (CABLE) ×2 IMPLANT
LEAD TENDRIL SDX 2088TC-46CM (Lead) ×2 IMPLANT
LEAD TENDRIL SDX 2088TC-52CM (Lead) ×2 IMPLANT
PAD DEFIB LIFELINK (PAD) ×2 IMPLANT
PPM ASSURITY DR PM2240 (Pacemaker) ×2 IMPLANT
SHEATH CLASSIC 7F (SHEATH) ×4 IMPLANT
SHEATH CLASSIC 9F 25CM (SHEATH) ×2 IMPLANT
TRAY PACEMAKER INSERTION (PACKS) ×2 IMPLANT

## 2015-03-20 NOTE — Care Management Note (Signed)
Case Management Note  Patient Details  Name: SALOME MASSENA MRN: [id number] Date of Birth: 02-Oct-1940  Subjective/Objective:    Patient is from [facility] and will be ready for dc back to [facility], NCM informed CSW.                Action/Plan:   Expected Discharge Date:                  Expected Discharge Plan:  Skilled Nursing Facility  In-House Referral:  Clinical Social Work  Discharge planning Services  CM Consult  Post Acute Care Choice:    Choice offered to:     DME Arranged:    DME Agency:     HH Arranged:    [place] Agency:     Status of Service:  Completed, signed off  Medicare Important Message Given:    Date Medicare IM Given:    Medicare IM give by:    Date Additional Medicare IM Given:    Additional Medicare Important Message give by:     If discussed at [place] of Stay Meetings, dates discussed:    Additional Comments:  Zenon Mayo, RN 03/20/2015, 4:16 PM

## 2015-03-20 NOTE — Progress Notes (Signed)
Orthopedic Tech Progress Note Patient Details:  [name] [birth year] [id number]  Ortho Devices Type of Ortho Device: Arm sling Ortho Device/Splint Interventions: Application   [place] Pink 03/20/2015, 4:02 PM

## 2015-03-20 NOTE — H&P (View-Only) (Signed)
HPI [name] returns today for followup. He is a pleasant 75 yo man with a h/o complete heart block who underwent PPM insertion and had this complicated by a PPM infection with MRSA. He has undergone device removal and insertion of a temporary perm PM. He has received a full course of anti-biotics and has been aftebrile. He denies chest pain or sob. He has not had syncope. His HR is in the 40/min range. Allergies  Allergen Reactions  . Dexedrine [Dextroamphetamine Sulfate Er] Other (See Comments)    agitation  . Oxycodone Nausea And Vomiting     Current Outpatient Prescriptions  Medication Sig Dispense Refill  . acetaminophen (TYLENOL) 325 MG tablet Take 650 mg by mouth every 4 (four) hours as needed (pain). Do not exceed 4 gm of acetaminophen in 24 hours    . aspirin 81 MG chewable tablet Chew 81 mg by mouth at bedtime.    . clonazePAM (KLONOPIN) 0.5 MG tablet Take 1 tablet (0.5 mg total) by mouth See admin instructions. Take 1 tablet (0.5 mg) by mouth daily at bedtime, may also take 1 tablet (0.5 mg) 3 times daily as needed for anxiety 20 tablet 0  . DAPTOmycin (CUBICIN) 500 MG injection Inject 6 mg into the vein daily.    [name] Kitchen doxazosin (CARDURA) 1 MG tablet Take 1 mg by mouth at bedtime.     . fluticasone (FLONASE) 50 MCG/ACT nasal spray Place 2 sprays into both nostrils daily. For allergies    . gabapentin (NEURONTIN) 100 MG capsule Take 100 mg by mouth at bedtime.    [name] Kitchen guaifenesin (ROBITUSSIN) 100 MG/5ML syrup Take 100 mg by mouth every 6 (six) hours as needed for cough.     [name] Kitchen HYDROcodone-acetaminophen (NORCO/VICODIN) 5-325 MG tablet Take 1 tablet by mouth every 6 (six) hours as needed for moderate pain. 30 tablet 0  . insulin glargine (LANTUS) 100 UNIT/ML injection Inject 0.4 mLs (40 Units total) into the skin 2 (two) times daily. 10am and 5pm    . insulin lispro (HUMALOG KWIKPEN) 100 UNIT/ML KiwkPen Inject 4-10 Units into the skin 4 (four) times daily. Per sliding scale: CBG  201-25 4 units, 251-300 6 units, 301-350 8 units, 351-400 10 units, >400 12 units and call MD (at 6:30am, 11:30am, 4:30 pm, 9pm)    . lamoTRIgine (LAMICTAL) 100 MG tablet Take 100 mg by mouth daily. 10am    . loperamide (IMODIUM) 2 MG capsule Take 4 mg by mouth as needed (after each loose stool).     . nitroGLYCERIN (NITROSTAT) 0.4 MG SL tablet Place 0.4 mg under the tongue every 5 (five) minutes as needed for chest pain.    . traMADol (ULTRAM) 50 MG tablet Take by mouth every 12 (twelve) hours as needed for moderate pain.    . traZODone (DESYREL) 50 MG tablet Take 50 mg by mouth at bedtime.    [name] Kitchen UNABLE TO FIND Take 25 mg by mouth every 6 (six) hours as needed (itching). Geri-Dryl    . vancomycin 1,500 mg in sodium chloride 0.9 % 500 mL Inject 1,500 mg into the vein every 12 (twelve) hours. vancomycin through 02/22/2015    . Vitamin D, Ergocalciferol, (DRISDOL) 50000 units CAPS capsule Take 50,000 Units by mouth every 7 (seven) days.     No current facility-administered medications for this visit.     Past Medical History  Diagnosis Date  . CAD (coronary artery disease)     a. s/p CABG  . Obesity   .  BPH (benign prostatic hypertrophy)   . UNIVERSAL ULCERATIVE COLITIS         . Ischemic cardiomyopathy    . DM         . OBSTRUCTIVE SLEEP APNEA         . HYPERTENSION         . Hyperlipidemia    . Gout    . Bipolar affective disorder ([place])    . TIA (transient ischemic attack)    . Chronic pain    . CVA (cerebral infarction)      Small left occipital  . Narcolepsy    . History of alcohol abuse    . Depression    . [facility] spotted fever   . Benign neoplasm of colon      Adenomatous polyps  . 2Nd degree AV block     a. s/p STJ dual chamber pacemaker 11/2014    ROS:   All systems reviewed and negative except as noted in the HPI.   Past Surgical History  Procedure Laterality Date  . Coronary artery bypass graft      Median sternotomy, extracorporeal circulation,   coronary artery bypass graft surgery x4 using a sequential left internal   mammary artery graft to the mid and distal left anterior descending, a   saphenous vein graft to diagonal branch of the left anterior descending,   and a saphenous vein graft to the obtuse marginal branch of left   circumflex coronary artery.    . Cardiac catheterization      Triple-vessel coronary artery disease. Low-normal left ventricular systolic function with mild   anteroapical wall motion abnormality.   . Tonsilectomy, adenoidectomy, bilateral myringotomy and tubes  1946  . Multiple extractions with alveoloplasty  12/01/2011    Procedure: MULTIPLE EXTRACION WITH ALVEOLOPLASTY;  Surgeon: Lenn Cal, DDS;  Location: [facility];  Service: Oral Surgery;  Laterality: N/A;  Extraction of tooth #'s 3,4,5,6,7,8,9,10,11,12,13,14,15,16,17,20,21,22,23,24,25,26,27,28,29,30,31,32 with alveoloplasty and bilateral mandibular lingual tori  . Flexible sigmoidoscopy  01/17/2012    Procedure: FLEXIBLE SIGMOIDOSCOPY;  Surgeon: Lafayette Dragon, MD;  Location: [facility];  Service: Endoscopy;  Laterality: N/A;  . Ep implantable device N/A 12/12/2014    Procedure: Pacemaker Implant;  Surgeon: [name], MD;  Location: [facility];  Service: Cardiovascular;  Laterality: N/A;  . Ep implantable device N/A 01/28/2015    Procedure: PPM Generator Removal;  Surgeon: [name], MD;  Location: [facility];  Service: Cardiovascular;  Laterality: N/A;  . Cardiac catheterization N/A 01/28/2015    Procedure: Temporary Pacemaker;  Surgeon: [name], MD;  Location: [facility];  Service: Cardiovascular;  Laterality: N/A;     Family History  Problem Relation Age of Onset  . Diabetes Father   . Heart disease Father   . Heart attack Mother   . Aortic aneurysm Mother   . Alcohol abuse Other   . Arthritis Other   . Hyperlipidemia Other   . Heart disease Other   . Stroke Other   . Hypertension Other   . Diabetes  Other   . Mental illness Other   . Alcohol abuse Other   . Arthritis Other   . Heart disease Other   . Mental illness Other   . Diabetes Other      Social History   Social History  . Marital Status: Married    Spouse Name: N/A  . Number of Children: 2  . Years of Education:  12   Occupational History  . retired    Social History Main Topics  . Smoking status: Former Smoker    Quit date: 01/18/2008  . Smokeless tobacco: Former Systems developer  . Alcohol Use: No  . Drug Use: No  . Sexual Activity: No   Other Topics Concern  . Not on file   Social History Narrative   Patient has been married for 51 years.   Patient presents with his wife today.    Patient has 2 grown children and lives in [place] areas respectively.   Patient is a nonsmoker, nondrinker at this time.     BP 102/50 mmHg  Pulse 70  Ht 5' 8.5" (1.74 m)  Wt   Physical Exam:  Obese and diskempt appearing elderly man, NAD HEENT: Unremarkable Neck:  No JVD, no thyromegally, temp-perm PM in left IJ Lymphatics:  No adenopathy Back:  No CVA tenderness Lungs:  Clear with no wheezes HEART:  Regular rate rhythm, no murmurs, no rubs, no clicks Abd:  soft, obese, positive bowel sounds, no organomegally, no rebound, no guarding Ext:  2 plus pulses, no edema, no cyanosis, no clubbing Skin:  No rashes no nodules Neuro:  CN II through XII intact, motor grossly intact   DEVICE - no capture with high output pacing  Assess/Plan: 1. PPM system infection with MRSA - I have discussed the situation with Dr. Megan Salon. He will undergo repeat blood cultures today or tomorrow and we will plan to have him undergo PPM system re-implantation 2. Complete heart block - he has an escape rhythm of 35-40 today. He is very sedentary but appears to be asymptomatic 3. Obesity - he is encouraged to lose. 4. Skin - his wife tells me he has a sore on his rear end. We will need to look at this prior to insertion of a new  device.  Mikle Bosworth.D.

## 2015-03-20 NOTE — Discharge Instructions (Signed)
° ° °  Supplemental Discharge Instructions for  Pacemaker/Defibrillator Patients  Activity No heavy lifting or vigorous activity with your left/right arm for 6 to 8 weeks.  Do not raise your left/right arm above your head for one week.  Gradually raise your affected arm as drawn below.              03/24/15                      03/25/15                       03/26/15                     03/27/15 __  NO DRIVING for  1 week   ; you may begin driving on  [id number]   .  WOUND CARE - Keep the wound area clean and dry.  Do not get this area wet for one week. No showers for one week; you may shower on  03/27/15   . - The tape/steri-strips on your wound will fall off; do not pull them off.  No bandage is needed on the site.  DO  NOT apply any creams, oils, or ointments to the wound area. - If you notice any drainage or discharge from the wound, any swelling or bruising at the site, or you develop a fever > 101? F after you are discharged home, call the office at once.  Special Instructions - You are still able to use cellular telephones; use the ear opposite the side where you have your pacemaker/defibrillator.  Avoid carrying your cellular phone near your device. - When traveling through airports, show security personnel your identification card to avoid being screened in the metal detectors.  Ask the security personnel to use the hand wand. - Avoid arc welding equipment, MRI testing (magnetic resonance imaging), TENS units (transcutaneous nerve stimulators).  Call the office for questions about other devices. - Avoid electrical appliances that are in poor condition or are not properly grounded. - Microwave ovens are safe to be near or to operate.  Additional information for defibrillator patients should your device go off: - If your device goes off ONCE and you feel fine afterward, notify the device clinic nurses. - If your device goes off ONCE and you do not feel well afterward, call 911. - If your device  goes off TWICE, call 911. - If your device goes off THREE times in one day, call 911.  DO NOT DRIVE YOURSELF OR A FAMILY MEMBER WITH A DEFIBRILLATOR TO THE HOSPITAL--CALL 911.

## 2015-03-20 NOTE — Discharge Summary (Signed)
ELECTROPHYSIOLOGY PROCEDURE DISCHARGE SUMMARY    Patient ID: [name],  MRN: [id number], DOB/AGE: Jul 08, 1940 75 y.o.  Admit date: 03/20/2015 Discharge date: 03/21/2015    Primary Care Physician: Cathlean Cower, MD Primary Cardiologist: Dr. Angelena Form Electrophysiologist: Dr. Lovena Le  Primary Discharge Diagnosis:  Pacemaker infection CHB  Secondary Discharge Diagnosis:  1. CAD 2. ICM 3. DM 4. HTN 5. HLD  Allergies  Allergen Reactions  . Dexedrine [Dextroamphetamine Sulfate Er] Other (See Comments)    agitation  . Oxycodone Nausea And Vomiting     Procedures This Admission:  1.  Implantation of a STJ dual chamber PPM on 03/20/15 by Dr Lovena Le.  The patient received aSt. Jude (serial number [id number]) pacemaker with St. Jude (serial number [id number]) right atrial lead and a St. Jude (serial number [id number]) right ventricular lead.  There were no immediate post procedure complications. 2.  CXR on 03/21/15 demonstrated no pneumothorax status post device implantation.   3. Explant/removal of temporary pacemaker system  Brief HPI: [name] is a 75 y.o. male with a h/o complete heart block who underwent PPM insertion and had this complicated by a PPM infection with MRSA. He has undergone device removal and insertion of a temporary perm PM.  He comes to [facility] today for elective procedure to remove the temporary pacing system and implantation of a new PPM.  Past medical history includes CAD/ICM, DM, HLD, HTN, and CHB.  Risks, benefits, and alternatives to PPM implantation were reviewed with the patient who wished to proceed.   Hospital Course:  The patient was admitted and underwent implantation of a PPM with details as outlined above. He was monitored on telemetry overnight which demonstrated a stable rhythm however, in the morning he went into a PPM induced VT.  Application of a magnet corrected the problem.  His pacer was them reprogrammed.  Right chest was without hematoma  or ecchymosis.   CXR was obtained and demonstrated no pneumothorax status post device implantation.   Wound care, arm mobility, and restrictions were reviewed with the patient.  The patient was examined by Dr. Lovena Le and considered stable for discharge to home.    Physical Exam: Filed Vitals:   03/21/15 0436 03/21/15 0811 03/21/15 0836 03/21/15 0847  BP: 136/80 175/92    Pulse: 80 77 85 120  Temp: 97 F (36.1 C) 98.1 F (36.7 C)    TempSrc: Axillary Oral    Resp: 12 23    Height:      Weight: 228 lb 2.8 oz (103.5 kg)     SpO2: 99% 93%      Labs:   Lab Results  Component Value Date   WBC 6.8 03/20/2015   HGB 13.2 03/20/2015   HCT 40.6 03/20/2015   MCV 91.6 03/20/2015   PLT 175 03/20/2015     Recent Labs Lab 03/20/15 1038  NA 140  K 4.1  CL 102  CO2 28  BUN 23*  CREATININE 1.23  CALCIUM 9.6  GLUCOSE 171*    Discharge Medications:    Medication List    ASK your doctor about these medications        acetaminophen 325 MG tablet  Commonly known as:  TYLENOL  Take 650 mg by mouth every 4 (four) hours as needed (pain). Do not exceed 4 gm of acetaminophen in 24 hours     aspirin 81 MG chewable tablet  Chew 81 mg by mouth at bedtime.     clonazePAM 0.5 MG tablet  Commonly known as:  KLONOPIN  Take 1 tablet (0.5 mg total) by mouth See admin instructions. Take 1 tablet (0.5 mg) by mouth daily at bedtime, may also take 1 tablet (0.5 mg) 3 times daily as needed for anxiety     DIPHENHYDRAMINE HCL IJ  Inject 25 mg as directed 4 (four) times daily as needed (for breakthrough itching and rash (50 mg/ml vial, 0.5 ml =25 mg)).     doxazosin 1 MG tablet  Commonly known as:  CARDURA  Take 1 mg by mouth at bedtime.     feeding supplement (PRO-STAT SUGAR FREE 64) Liqd  Take 30 mLs by mouth 3 (three) times daily with meals.     fluticasone 50 MCG/ACT nasal spray  Commonly known as:  FLONASE  Place 2 sprays into both nostrils daily. For allergies     gabapentin 100 MG  capsule  Commonly known as:  NEURONTIN  Take 100 mg by mouth at bedtime.     HUMALOG KWIKPEN 100 UNIT/ML KiwkPen  Generic drug:  insulin lispro  Inject 4-10 Units into the skin 4 (four) times daily. Per sliding scale: CBG 201-25 4 units, 251-300 6 units, 301-350 8 units, 351-400 10 units, >400 12 units and call MD (at 6:30am, 11:30am, 4:30 pm, 9pm)     HYDROcodone-acetaminophen 5-325 MG tablet  Commonly known as:  NORCO/VICODIN  Take 1 tablet by mouth every 6 (six) hours as needed for moderate pain.     insulin glargine 100 UNIT/ML injection  Commonly known as:  LANTUS  Inject 0.4 mLs (40 Units total) into the skin 2 (two) times daily. 10am and 5pm     lamoTRIgine 100 MG tablet  Commonly known as:  LAMICTAL  Take 100 mg by mouth daily. 10am     loperamide 2 MG capsule  Commonly known as:  IMODIUM  Take 4 mg by mouth as needed (after each loose stool).     magnesium oxide 400 MG tablet  Commonly known as:  MAG-OX  Take 400 mg by mouth daily.     nitroGLYCERIN 0.4 MG SL tablet  Commonly known as:  NITROSTAT  Place 0.4 mg under the tongue every 5 (five) minutes as needed for chest pain.     ROBAFEN 100 MG/5ML syrup  Generic drug:  guaifenesin  Take 100 mg by mouth every 8 (eight) hours as needed for cough.     traZODone 50 MG tablet  Commonly known as:  DESYREL  Take 50 mg by mouth at bedtime.     vancomycin 1,500 mg in sodium chloride 0.9 % 500 mL  Inject 1,500 mg into the vein every 12 (twelve) hours. vancomycin through 02/22/2015     Vitamin D (Ergocalciferol) 50000 units Caps capsule  Commonly known as:  DRISDOL  Take 50,000 Units by mouth every Monday.        Disposition:  Home  Follow-up Information    Follow up with [facility] On 03/30/2015.   Specialty:  Cardiology   Why:  11:30AM wound check   Contact information:   [address], [place] [phone]      Follow up with [name], MD On  06/23/2015.   Specialty:  Cardiology   Why:  11:15AM   Contact information:   [address] [address] [phone]       Duration of Discharge Encounter: Greater than 30 minutes including physician time.  Signed, Tarri Fuller PAC  03/21/2015 8:48 AM    EP Attending  Patient seen and examined. Agree with above. He is stable for discharge.  Mikle Bosworth.D.

## 2015-03-20 NOTE — Progress Notes (Signed)
Per infection control to place patient on contact until test results for PCR test result comes back.  Patients wife insistes he was negative per blood test during questioning.

## 2015-03-20 NOTE — Interval H&P Note (Signed)
History and Physical Interval Note:  03/20/2015 10:17 AM  [name]  has presented today for surgery, with the diagnosis of heart block  The various methods of treatment have been discussed with the patient and family. After consideration of risks, benefits and other options for treatment, the patient has consented to  Procedure(s): Pacemaker Implant (N/A) as a surgical intervention .  The patient's history has been reviewed, patient examined, no change in status, stable for surgery.  I have reviewed the patient's chart and labs.  Questions were answered to the patient's satisfaction.     Mikle Bosworth.D.

## 2015-03-21 ENCOUNTER — Ambulatory Visit (HOSPITAL_COMMUNITY): Payer: Medicare Other

## 2015-03-21 ENCOUNTER — Encounter (HOSPITAL_COMMUNITY): Payer: Self-pay | Admitting: Cardiology

## 2015-03-21 DIAGNOSIS — I5022 Chronic systolic (congestive) heart failure: Secondary | ICD-10-CM | POA: Diagnosis not present

## 2015-03-21 DIAGNOSIS — I110 Hypertensive heart disease with heart failure: Secondary | ICD-10-CM | POA: Diagnosis not present

## 2015-03-21 DIAGNOSIS — I442 Atrioventricular block, complete: Secondary | ICD-10-CM

## 2015-03-21 DIAGNOSIS — R001 Bradycardia, unspecified: Secondary | ICD-10-CM | POA: Diagnosis not present

## 2015-03-21 LAB — GLUCOSE, CAPILLARY: Glucose-Capillary: 123 mg/dL — ABNORMAL HIGH (ref 65–99)

## 2015-03-21 NOTE — Progress Notes (Signed)
Patient will DC to: [facility] Anticipated DC date: 03/21/15 Family notified: Daughter Transport by: [facility]  CSW signing off.  Cedric Fishman, [place] Social Worker [phone]

## 2015-03-21 NOTE — Progress Notes (Signed)
Called report to Vallarie Mare, Therapist, sports at [employer].  Went over instructions as far as arm movement, will send paper work with patient

## 2015-03-21 NOTE — Progress Notes (Addendum)
    Subjective: No complaints  Objective: Vital signs in last 24 hours: Temp:  [97 F (36.1 C)-98.1 F (36.7 C)] 97 F (36.1 C) (03/04 0436) Pulse Rate:  [36-80] 77 (03/04 0811) Resp:  [12-25] 23 (03/04 0811) BP: (130-175)/(56-96) 175/92 mmHg (03/04 0811) SpO2:  [93 %-99 %] 93 % (03/04 0811) Weight:  [227 lb (102.967 kg)-228 lb 2.8 oz (103.5 kg)] 228 lb 2.8 oz (103.5 kg) (03/04 0436) Last BM Date: 03/20/15  Intake/Output from previous day:   Intake/Output this shift:    Medications Scheduled Meds: . aspirin  81 mg Oral QHS  . Chlorhexidine Gluconate Cloth  6 each Topical Q0600  . clonazePAM  0.5 mg Oral QHS  . doxazosin  1 mg Oral QHS  . feeding supplement (PRO-STAT SUGAR FREE 64)  30 mL Oral TID WC  . fluticasone  2 spray Each Nare Daily  . gabapentin  100 mg Oral QHS  . insulin aspart  0-5 Units Subcutaneous QHS  . insulin aspart  0-9 Units Subcutaneous TID WC  . insulin glargine  45 Units Subcutaneous BID PC  . lamoTRIgine  100 mg Oral Daily  . magnesium oxide  400 mg Oral Daily  . mupirocin ointment  1 application Nasal BID  . traZODone  50 mg Oral QHS  . [START ON 03/23/2015] Vitamin D (Ergocalciferol)  50,000 Units Oral Q Mon   Continuous Infusions:  PRN Meds:.acetaminophen, acetaminophen, acetaminophen, clonazePAM, guaifenesin, loperamide, nitroGLYCERIN, ondansetron (ZOFRAN) IV, ondansetron (ZOFRAN) IV  PE: General appearance: alert, cooperative, no distress and eating breakfast. Lungs: clear to auscultation bilaterally Heart: regular rate and rhythm, S1, S2 normal, no murmur, click, rub or gallop Extremities: No LEE Pulses: 2+ and symmetric Skin: Warm and dry.  No drainage on the PPM dressing. Neurologic: Grossly normal  Lab Results:   Recent Labs  03/20/15 1038  WBC 6.8  HGB 13.2  HCT 40.6  PLT 175   BMET  Recent Labs  03/20/15 1038  NA 140  K 4.1  CL 102  CO2 28  GLUCOSE 171*  BUN 23*  CREATININE 1.23  CALCIUM 9.6      Assessment/Plan  Active Problems:   Chronic systolic CHF (congestive heart failure) (HCC)   Mobitz type 2 second degree atrioventricular block   Pacemaker   Complete heart block ([place])   SP successful implantation of St. Jude (serial number [id number]) pacemaker.   No pneumothorax on CXR.  BO and labs stable.  PPM induced tachycardia this morning which was responsive to magnet application.  St. Jude rep to reprogram PPM to increase PVARP before DC.  Wound check in 2 weeks and 3 months FU with Dr. Lovena Le.   Tarri Fuller PA-C 03/21/2015 8:14 AM  EP Attending  Patient seen and examined. Agree with above. The data have been reviewed. He is doing well after PPM insertion this morning except for PMT. He will have his device reprogrammed prior to DC to SNF. His incision looks good. He will undergo usual followup. He will need to keep his skin dry for a week.   Mikle Bosworth.D.

## 2015-03-21 NOTE — NC FL2 (Signed)
[place] LEVEL OF CARE SCREENING TOOL     IDENTIFICATION  Patient Name: [name] Birthdate: Sep 20, 1940 Sex: male Admission Date (Current Location): 03/20/2015  [facility] and [place] Number:  Herbalist and Address:  [facility], [place] [address] [place], [place] [address]      Provider Number: [id number]  Attending Physician Name and Address:  [name], MD  Relative Name and Phone Number:  [name], wife.  [phone].    Current Level of Care: Hospital Recommended Level of Care: [place] Prior Approval Number:    Date Approved/Denied:   PASRR Number:    Discharge Plan: SNF    Current Diagnoses: Patient Active Problem List   Diagnosis Date Noted  . Complete heart block ([place]) 03/20/2015  . Bradycardia 03/14/2015  . Dementia with behavioral disturbance 02/15/2015  . Staphylococcus aureus bacteremia with sepsis ([place]) 01/28/2015  . Infection of pacemaker pocket ([place]) 01/28/2015  . Acute respiratory failure ([place]) 01/26/2015  . AKI (acute kidney injury) ([place]) 01/26/2015  . Acute encephalopathy 01/26/2015  . Altered mental status   . Vitamin D deficiency 01/07/2015  . Mobitz type 2 second degree atrioventricular block 12/12/2014  . Pacemaker 12/12/2014  . Symptomatic bradycardia 12/03/2014  . Lower back pain 06/12/2012  . Right hip pain 06/12/2012  . Intertrigo 05/09/2012  . Insomnia 04/19/2012  . General weakness 03/07/2012  . 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 Diarrhea 01/17/2012  . Pyelonephritis 01/15/2012  . Peripheral neuropathy ([place]) 09/28/2011  . Urinary incontinence 09/28/2011  . Preventative health care 09/23/2011  . Gout 09/23/2011  . Bipolar affective disorder ([place]) 09/23/2011  . TIA (transient ischemic attack) 09/23/2011  . Chronic pain 09/23/2011  . DNR (do not resuscitate) 09/23/2011  . CVA (cerebral infarction) 09/23/2011  . Narcolepsy  09/23/2011  . History of alcohol abuse 09/23/2011  . BPH (benign prostatic hypertrophy)   . Left ventricular systolic dysfunction   . Chronic systolic CHF (congestive heart failure) ([place]) 05/05/2011  . Edema 01/03/2011  . CAD (coronary artery disease) 05/18/2010  . Hyperlipidemia 05/18/2010  . Other abnormality of urination(788.69) 11/25/2009  . Other specified forms of chronic ischemic heart disease 05/19/2009  . CAD, ARTERY BYPASS GRAFT 11/18/2008  . AMI, INFERIOR WALL 09/30/2008  . Obstructive sleep apnea 09/16/2008  . SINUS TACHYCARDIA 09/02/2008  . DM (diabetes mellitus), type 2 with peripheral vascular complications ([place]) XX123456  . OBESITY 09/01/2008  . Hypertensive heart disease with heart failure ([place]) 09/01/2008  . TOBACCO ABUSE 07/31/2007  . 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[place]) 07/31/2007    Orientation RESPIRATION BLADDER Height & Weight     Self, Situation, Place  Normal Incontinent Weight: 228 lb 2.8 oz (103.5 kg) Height:  5' 8.5" (174 cm)  BEHAVIORAL SYMPTOMS/MOOD NEUROLOGICAL BOWEL NUTRITION STATUS      Continent  (Please see DC summary)  AMBULATORY STATUS COMMUNICATION OF NEEDS Skin   Extensive Assist Verbally Surgical wounds (Incision on chest)                       Personal Care Assistance Level of Assistance  Bathing, Feeding, Dressing Bathing Assistance: Maximum assistance Feeding assistance: Limited assistance Dressing Assistance: Limited assistance     Functional Limitations Info             SPECIAL CARE FACTORS FREQUENCY  Contractures      Additional Factors Info  Code Status, Allergies, Psychotropic, Insulin Sliding Scale, Isolation Precautions Code Status Info: DNR Allergies Info: Dexedrine, Oxycodone Psychotropic Info: Klonopin Insulin Sliding Scale Info: insulin aspart (novoLOG) injection 0-5 Units;insulin glargine (LANTUS) injection 45 Units Isolation Precautions Info: Contact precautions      Current Medications (03/21/2015):  This is the current hospital active medication list Current Facility-Administered Medications  Medication Dose Route Frequency Provider Last Rate Last Dose  . acetaminophen (TYLENOL) tablet 325-650 mg  325-650 mg Oral Q4H PRN [name], MD      . acetaminophen (TYLENOL) tablet 325-650 mg  325-650 mg Oral Q4H PRN [name], MD      . acetaminophen (TYLENOL) tablet 650 mg  650 mg Oral Q4H PRN [name], MD      . aspirin chewable tablet 81 mg  81 mg Oral QHS [name], MD   81 mg at 03/20/15 2206  . Chlorhexidine Gluconate Cloth 2 % PADS 6 each  6 each Topical Q0600 [name], MD   6 each at 03/21/15 0757  . clonazePAM (KLONOPIN) tablet 0.5 mg  0.5 mg Oral QHS [name], MD   0.5 mg at 03/20/15 2206  . clonazePAM (KLONOPIN) tablet 0.5 mg  0.5 mg Oral TID PRN [name], MD      . doxazosin (CARDURA) tablet 1 mg  1 mg Oral QHS [name], MD   1 mg at 03/20/15 2207  . feeding supplement (PRO-STAT SUGAR FREE 64) liquid 30 mL  30 mL Oral TID WC [name], MD   30 mL at 03/21/15 0757  . fluticasone (FLONASE) 50 MCG/ACT nasal spray 2 spray  2 spray Each Nare Daily [name], MD   2 spray at 03/20/15 1800  . gabapentin (NEURONTIN) capsule 100 mg  100 mg Oral QHS [name], MD   100 mg at 03/20/15 2207  . guaifenesin (ROBITUSSIN) 100 MG/5ML syrup 100 mg  100 mg Oral Q8H PRN [name], MD      . insulin aspart (novoLOG) injection 0-5 Units  0-5 Units Subcutaneous QHS [name], MD   0 Units at 03/20/15 2200  . insulin aspart (novoLOG) injection 0-9 Units  0-9 Units Subcutaneous TID WC [name], MD   1 Units at 03/21/15 0750  . insulin glargine (LANTUS) injection 45 Units  45 Units Subcutaneous BID PC [name], MD   45 Units at 03/21/15 0750  . lamoTRIgine (LAMICTAL) tablet 100 mg  100 mg Oral Daily [name], MD   100 mg at 03/20/15 1830  . loperamide (IMODIUM) capsule 4 mg  4 mg Oral PRN  [name], MD      . magnesium oxide (MAG-OX) tablet 400 mg  400 mg Oral Daily [name], MD   400 mg at 03/20/15 2207  . mupirocin ointment (BACTROBAN) 2 % 1 application  1 application Nasal BID [name], MD   1 application at [phone] [id number]  . nitroGLYCERIN (NITROSTAT) SL tablet 0.4 mg  0.4 mg Sublingual Q5 min PRN [name], MD      . ondansetron [facility]) injection 4 mg  4 mg Intravenous Q6H PRN [name], MD      . ondansetron [facility]) injection 4 mg  4 mg Intravenous Q6H PRN [name], MD      . traZODone (DESYREL) tablet 50 mg  50 mg Oral QHS [name], MD   50 mg at 03/20/15 2206  . [START ON 03/23/2015] Vitamin D (Ergocalciferol) (DRISDOL) capsule 50,000 Units  50,000 Units Oral Q Mon [name], MD         Discharge Medications: Please see discharge summary for a list of discharge medications.  Relevant Imaging Results:  Relevant Lab Results:   Additional [place] Charistopher Rumble, LCSWA

## 2015-03-21 NOTE — Progress Notes (Signed)
Patient will DC to: [facility] Anticipated DC date: 03/21/15 Family notified: Daughter Transport by: PTAR  CSW signing off.  Cedric Fishman, [place] Social Worker [phone]

## 2015-03-23 ENCOUNTER — Telehealth: Payer: Self-pay | Admitting: *Deleted

## 2015-03-23 MED FILL — Gentamicin Sulfate Inj 40 MG/ML: INTRAMUSCULAR | Qty: 2 | Status: AC

## 2015-03-23 MED FILL — Sodium Chloride Irrigation Soln 0.9%: Qty: 500 | Status: AC

## 2015-03-23 NOTE — Telephone Encounter (Signed)
Does the pt need a return visit?  Has had pacemaker placed.  Message has been sent to Dr. Megan Salon for a response.

## 2015-03-23 NOTE — Telephone Encounter (Signed)
No, not as long as he is doing well.

## 2015-03-23 NOTE — Telephone Encounter (Signed)
Shared Dr. Hale Bogus message with Vallarie Mare, RN, [facility].

## 2015-03-23 NOTE — Telephone Encounter (Signed)
Patient's wife calling to see if appointment 3/23 is still needed, as patient has already had the blood cultures drawn.  Please advise. Landis Gandy, RN

## 2015-03-23 NOTE — Telephone Encounter (Signed)
Left message notifying them, cancelled the appointment. Thanks!

## 2015-03-23 NOTE — Telephone Encounter (Signed)
It is okay with me if he cancels that appointment.

## 2015-03-23 NOTE — Telephone Encounter (Signed)
Pt was on TCM list admitted for pacemaker infection & CHB. D/C 03/21/15 will be f/u w/cardiology on 03/30/15. Pt has not seen Dr. Jenny Reichmann since 2014 not sure ifhe is here PCP...[name]

## 2015-03-24 ENCOUNTER — Encounter: Payer: Self-pay | Admitting: Internal Medicine

## 2015-03-24 ENCOUNTER — Non-Acute Institutional Stay (SKILLED_NURSING_FACILITY): Payer: Medicare Other | Admitting: Internal Medicine

## 2015-03-24 ENCOUNTER — Ambulatory Visit: Payer: Self-pay | Admitting: Internal Medicine

## 2015-03-24 DIAGNOSIS — G4700 Insomnia, unspecified: Secondary | ICD-10-CM | POA: Diagnosis not present

## 2015-03-24 DIAGNOSIS — F0391 Unspecified dementia with behavioral disturbance: Secondary | ICD-10-CM | POA: Diagnosis not present

## 2015-03-24 DIAGNOSIS — F3175 Bipolar disorder, in partial remission, most recent episode depressed: Secondary | ICD-10-CM | POA: Diagnosis not present

## 2015-03-24 DIAGNOSIS — I110 Hypertensive heart disease with heart failure: Secondary | ICD-10-CM | POA: Diagnosis not present

## 2015-03-24 DIAGNOSIS — I442 Atrioventricular block, complete: Secondary | ICD-10-CM | POA: Diagnosis not present

## 2015-03-24 DIAGNOSIS — F03918 Unspecified dementia, unspecified severity, with other behavioral disturbance: Secondary | ICD-10-CM

## 2015-03-24 NOTE — Progress Notes (Signed)
MRN: [id number] Name: [name]  Sex: male Age: 75 y.o. DOB: 28-Jul-1940  [place] #: [facility] Facility/Room:308 Level Of Care: SNF Provider: Inocencio Homes D Emergency Contacts: Extended Emergency Contact Information Primary Emergency Contact: [name],[name] Address: [address]          [place], [place] [address] [name] of [place] Phone: [phone] Mobile Phone: [phone] Relation: Spouse Secondary Emergency Contact: [name], [name] of [place] Phone: [phone] Mobile Phone: [phone] Relation: Daughter  Code Status:   Allergies: Dexedrine and Oxycodone  Chief Complaint  Patient presents with  . Readmit To SNF    HPI: Patient is 75 y.o. male with a h/o complete heart block who underwent PPM insertion and had this complicated by a PPM infection with MRSA. He has undergone device removal and insertion of a temporary perm PM. He comes to [facility] today for elective procedure to remove the temporary pacing system and implantation of a new PPM. Past medical history includes CAD/ICM, DM, HLD, HTN, and CHB.  The patient was admitted to [facility] form 3/3-4 and underwent implantation of a PPM. He was monitored on telemetry overnight which demonstrated a stable rhythm however, in the morning he went into a PPM induced VT. Application of a magnet corrected the problem. His pacer was them reprogrammed. Right chest was without hematoma or ecchymosis. CXR was obtained and demonstrated no pneumothorax status post device implantation. Pt was d/c to SNF for residential care. While at SNF pt will be followed for HTN, tx with cardura, bipolar dx, tx with lamictal and insomnia, tx with trazodone.  Past Medical History  Diagnosis Date  . CAD (coronary artery disease)     a. s/p CABG  . Obesity   . BPH (benign prostatic hypertrophy)   . UNIVERSAL ULCERATIVE COLITIS         . Ischemic cardiomyopathy    . DM         . OBSTRUCTIVE  SLEEP APNEA         . HYPERTENSION         . Hyperlipidemia    . Gout    . Bipolar affective disorder ([place])    . TIA (transient ischemic attack)    . Chronic pain    . CVA (cerebral infarction)      Small left occipital  . Narcolepsy    . History of alcohol abuse    . Depression    . [employer] spotted fever   . Benign neoplasm of colon      Adenomatous polyps  . 2Nd degree AV block     a. s/p STJ dual chamber pacemaker 11/2014    Past Surgical History  Procedure Laterality Date  . Coronary artery bypass graft      Median sternotomy, extracorporeal circulation,  coronary artery bypass graft surgery x4 using a sequential left internal   mammary artery graft to the mid and distal left anterior descending, a   saphenous vein graft to diagonal branch of the left anterior descending,   and a saphenous vein graft to the obtuse marginal branch of left   circumflex coronary artery.    . Cardiac catheterization      Triple-vessel coronary artery disease. Low-normal left ventricular systolic function with mild   anteroapical wall motion abnormality.   . Tonsilectomy, adenoidectomy, bilateral myringotomy and tubes  1946  . Multiple extractions with alveoloplasty  12/01/2011    Procedure: MULTIPLE EXTRACION  WITH ALVEOLOPLASTY;  Surgeon: Lenn Cal, DDS;  Location: [facility];  Service: Oral Surgery;  Laterality: N/A;  Extraction of tooth #'s 3,4,5,6,7,8,9,10,11,12,13,14,15,16,17,20,21,22,23,24,25,26,27,28,29,30,31,32 with alveoloplasty and bilateral mandibular lingual tori  . Flexible sigmoidoscopy  01/17/2012    Procedure: FLEXIBLE SIGMOIDOSCOPY;  Surgeon: Lafayette Dragon, MD;  Location: [facility];  Service: Endoscopy;  Laterality: N/A;  . Ep implantable device N/A 12/12/2014    Procedure: Pacemaker Implant;  Surgeon: [name], MD;  Location: [place] [facility];  Service: Cardiovascular;  Laterality: N/A;  . Ep implantable device N/A 01/28/2015    Procedure: PPM Generator Removal;   Surgeon: [name], MD;  Location: [place] [facility];  Service: Cardiovascular;  Laterality: N/A;  . Cardiac catheterization N/A 01/28/2015    Procedure: Temporary Pacemaker;  Surgeon: [name], MD;  Location: [place] [facility];  Service: Cardiovascular;  Laterality: N/A;  . Ep implantable device N/A 03/20/2015    Procedure: Pacemaker Implant;  Surgeon: [name], MD;  Location: [place] [facility];  Service: Cardiovascular;  Laterality: N/A;      Medication List       This list is accurate as of: 03/24/15 11:59 PM.  Always use your most recent med list.               acetaminophen 325 MG tablet  Commonly known as:  TYLENOL  Take 650 mg by mouth every 4 (four) hours as needed (pain). Do not exceed 4 gm of acetaminophen in 24 hours     aspirin 81 MG chewable tablet  Chew 81 mg by mouth at bedtime.     clonazePAM 0.5 MG tablet  Commonly known as:  KLONOPIN  Take 1 tablet (0.5 mg total) by mouth See admin instructions. Take 1 tablet (0.5 mg) by mouth daily at bedtime, may also take 1 tablet (0.5 mg) 3 times daily as needed for anxiety     DIPHENHYDRAMINE HCL IJ  Inject 25 mg as directed 4 (four) times daily as needed (for breakthrough itching and rash (50 mg/ml vial, 0.5 ml =25 mg)).     doxazosin 1 MG tablet  Commonly known as:  CARDURA  Take 1 mg by mouth at bedtime.     feeding supplement (PRO-STAT SUGAR FREE 64) Liqd  Take 30 mLs by mouth 3 (three) times daily with meals.     fluticasone 50 MCG/ACT nasal spray  Commonly known as:  FLONASE  Place 2 sprays into both nostrils daily. For allergies     gabapentin 100 MG capsule  Commonly known as:  NEURONTIN  Take 100 mg by mouth at bedtime.     HUMALOG KWIKPEN 100 UNIT/ML KiwkPen  Generic drug:  insulin lispro  Inject 4-10 Units into the skin 4 (four) times daily. Per sliding scale: CBG 201-25 4 units, 251-300 6 units, 301-350 8 units, 351-400 10 units, >400 12 units and call MD (at 6:30am, 11:30am, 4:30 pm,  9pm)     HYDROcodone-acetaminophen 5-325 MG tablet  Commonly known as:  NORCO/VICODIN  Take 1 tablet by mouth every 6 (six) hours as needed for moderate pain.     insulin glargine 100 UNIT/ML injection  Commonly known as:  LANTUS  Inject 0.4 mLs (40 Units total) into the skin 2 (two) times daily. 10am and 5pm     lamoTRIgine 100 MG tablet  Commonly known as:  LAMICTAL  Take 100 mg by mouth daily. 10am     loperamide 2 MG capsule  Commonly  known as:  IMODIUM  Take 4 mg by mouth as needed (after each loose stool).     magnesium oxide 400 MG tablet  Commonly known as:  MAG-OX  Take 400 mg by mouth daily.     nitroGLYCERIN 0.4 MG SL tablet  Commonly known as:  NITROSTAT  Place 0.4 mg under the tongue every 5 (five) minutes as needed for chest pain.     ROBAFEN 100 MG/5ML syrup  Generic drug:  guaifenesin  Take 100 mg by mouth every 8 (eight) hours as needed for cough.     traZODone 50 MG tablet  Commonly known as:  DESYREL  Take 50 mg by mouth at bedtime.     Vitamin D (Ergocalciferol) 50000 units Caps capsule  Commonly known as:  DRISDOL  Take 50,000 Units by mouth every Monday.        No orders of the defined types were placed in this encounter.    There is no immunization history for the selected administration types on file for this patient.  Social History  Substance Use Topics  . Smoking status: Former Smoker    Quit date: 01/18/2008  . Smokeless tobacco: Former Systems developer  . Alcohol Use: No    Family history is  + DM, HD  Review of Systems  DATA OBTAINED: from patient, nurse GENERAL:  no fevers, fatigue, appetite changes SKIN: No itching, rash or wounds EYES: No eye pain, redness, discharge EARS: No earache, tinnitus, change in hearing NOSE: No congestion, drainage or bleeding  MOUTH/THROAT: No mouth or tooth pain, No sore throat RESPIRATORY: No cough, wheezing, SOB CARDIAC: No chest pain, palpitations, lower extremity edema  GI: No abdominal pain, No  N/V/D or constipation, No heartburn or reflux  GU: No dysuria, frequency or urgency, or incontinence  MUSCULOSKELETAL: No unrelieved bone/joint pain NEUROLOGIC: No headache, dizziness or focal weakness PSYCHIATRIC: No c/o anxiety or sadness   Filed Vitals:   03/29/15 1610  BP: 149/84  Pulse: 86  Temp: 99.1 F (37.3 C)  Resp: 18    SpO2 Readings from Last 1 Encounters:  03/21/15 93%        Physical Exam  GENERAL APPEARANCE: Alert, conversant,  No acute distress.  SKIN: No diaphoresis rash; area of pacemaker R chest without heat or redness or TTP HEAD: Normocephalic, atraumatic  EYES: Conjunctiva/lids clear. Pupils round, reactive. EOMs intact.  EARS: External exam WNL, canals clear. Hearing grossly normal.  NOSE: No deformity or discharge.  MOUTH/THROAT: Lips w/o lesions  RESPIRATORY: Breathing is even, unlabored. Lung sounds are clear   CARDIOVASCULAR: Heart RRR no murmurs, rubs or gallops. No peripheral edema.   GASTROINTESTINAL: Abdomen is soft, non-tender, not distended w/ normal bowel sounds. GENITOURINARY: Bladder non tender, not distended  MUSCULOSKELETAL: No abnormal joints or musculature NEUROLOGIC:  Cranial nerves 2-12 grossly intact. Moves all extremities  PSYCHIATRIC: Mood and affect appropriate to situation, no behavioral issues  Patient Active Problem List   Diagnosis Date Noted  . Complete heart block ([place]) 03/20/2015  . Bradycardia 03/14/2015  . Dementia with behavioral disturbance 02/15/2015  . Staphylococcus aureus bacteremia with sepsis ([place]) 01/28/2015  . Infection of pacemaker pocket ([place]) 01/28/2015  . Acute respiratory failure ([place]) 01/26/2015  . AKI (acute kidney injury) ([place]) 01/26/2015  . Acute encephalopathy 01/26/2015  . Altered mental status   . Vitamin D deficiency 01/07/2015  . Mobitz type 2 second degree atrioventricular block 12/12/2014  . Pacemaker 12/12/2014  . Symptomatic bradycardia 12/03/2014  . Lower back pain 06/12/2012  .  Right hip pain 06/12/2012  . Intertrigo 05/09/2012  . Insomnia 04/19/2012  . General weakness 03/07/2012  . 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 Diarrhea 01/17/2012  . Pyelonephritis 01/15/2012  . Peripheral neuropathy ([place]) 09/28/2011  . Urinary incontinence 09/28/2011  . Preventative health care 09/23/2011  . Gout 09/23/2011  . Bipolar affective disorder ([place]) 09/23/2011  . TIA (transient ischemic attack) 09/23/2011  . Chronic pain 09/23/2011  . DNR (do not resuscitate) 09/23/2011  . CVA (cerebral infarction) 09/23/2011  . 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 History of alcohol abuse 09/23/2011  . BPH (benign prostatic hypertrophy)   . Left ventricular systolic dysfunction   . Chronic systolic CHF (congestive heart failure) ([place]) 05/05/2011  . Edema 01/03/2011  . CAD (coronary artery disease) 05/18/2010  . Hyperlipidemia 05/18/2010  . Other abnormality of urination(788.69) 11/25/2009  . Other specified forms of chronic ischemic heart disease 05/19/2009  . CAD, ARTERY BYPASS GRAFT 11/18/2008  . AMI, INFERIOR WALL 09/30/2008  . Obstructive sleep apnea 09/16/2008  . SINUS TACHYCARDIA 09/02/2008  . DM (diabetes mellitus), type 2 with peripheral vascular complications ([place]) [id number]  . OBESITY 09/01/2008  . Hypertensive heart disease with heart failure ([place]) 09/01/2008  . TOBACCO ABUSE 07/31/2007  . 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[place]) 07/31/2007    CBC    Component Value Date/Time   WBC 6.8 03/20/2015 1038   WBC 9.0 02/12/2015   RBC 4.43 03/20/2015 1038   HGB 13.2 03/20/2015 1038   HCT 40.6 03/20/2015 1038   PLT 175 03/20/2015 1038   MCV 91.6 03/20/2015 1038   LYMPHSABS 0.7 01/26/2015 1200   MONOABS 0.9 01/26/2015 1200   EOSABS 0.0 01/26/2015 1200   BASOSABS 0.0 01/26/2015 1200    CMP     Component Value Date/Time   NA 140 03/20/2015 1038   NA 137 02/12/2015   K 4.1 03/20/2015 1038   CL 102 03/20/2015 1038   CO2 28 03/20/2015 1038   GLUCOSE 171*  03/20/2015 1038   BUN 23* 03/20/2015 1038   BUN 26* 02/12/2015   CREATININE 1.23 03/20/2015 1038   CREATININE 1.1 02/12/2015   CREATININE 1.06 12/05/2014 1536   CALCIUM 9.6 03/20/2015 1038   PROT 5.8* 02/01/2015 0245   ALBUMIN 2.2* 02/01/2015 0245   AST 19 02/12/2015   ALT 49* 02/12/2015   ALKPHOS 151* 02/12/2015   BILITOT 0.4 02/01/2015 0245   GFRNONAA 55* 03/20/2015 1038   GFRAA >60 03/20/2015 1038    Lab Results  Component Value Date   HGBA1C 11.2* 06/13/2012     Dg Chest 2 View  03/21/2015  CLINICAL DATA:  Status post PICC pacemaker insertion EXAM: CHEST  2 VIEW COMPARISON:  January 29, 2015 FINDINGS: The left-sided pacemaker has been removed. There is a new right-sided pacemaker. There appear to be distal tips in the right atrium and right ventricle. Cardiomegaly remains. The hila and mediastinum are unchanged. No pneumothorax. No pulmonary nodules or masses. Minimal atelectasis suspected on the left. No other interval changes. IMPRESSION: Interval placement of right pacemaker with no pneumothorax. Electronically Signed   By: Dorise Bullion III M.D   On: 03/21/2015 07:07    Not all labs, radiology exams or other studies done during hospitalization come through on my EPIC note; however they are reviewed by me.    Assessment and Plan  Complete heart block (HCC) Pt with new pacemaker implanted to replace temporary one that was malfunctioning that had replaced the PPM that had been involved with a pacemaker pocket infection, now resolved.  Hypertensive heart disease with heart failure (HCC) SNF - adequately controlled with cardura 1 mg q HS  Bipolar affective disorder (HCC) SNF - controlled on lamictal 100 mg daily  Insomnia SNF - wil cont trazodone   Time spent > 35 min;> 50% of time with patient was spent reviewing records, labs, tests and studies, counseling and developing plan of care  [name], MD

## 2015-03-29 ENCOUNTER — Encounter: Payer: Self-pay | Admitting: Internal Medicine

## 2015-03-29 NOTE — Assessment & Plan Note (Signed)
SNF - pt asked if he could start aricept, which is reasonable so have started him at 5 mg qHS to go up to 10 mg in 4 weeks

## 2015-03-29 NOTE — Assessment & Plan Note (Signed)
SNF - controlled on lamictal 100 mg daily

## 2015-03-29 NOTE — Assessment & Plan Note (Signed)
Pt with new pacemaker implanted to replace temporary one that was malfunctioning that had replaced the PPM that had been involved with a pacemaker pocket infection, now resolved.

## 2015-03-29 NOTE — Assessment & Plan Note (Signed)
SNF - wil cont trazodone

## 2015-03-29 NOTE — Assessment & Plan Note (Signed)
SNF - adequately controlled with cardura 1 mg q HS

## 2015-03-30 ENCOUNTER — Encounter: Payer: Self-pay | Admitting: Internal Medicine

## 2015-03-30 ENCOUNTER — Ambulatory Visit (INDEPENDENT_AMBULATORY_CARE_PROVIDER_SITE_OTHER): Payer: Medicare Other | Admitting: *Deleted

## 2015-03-30 DIAGNOSIS — I442 Atrioventricular block, complete: Secondary | ICD-10-CM

## 2015-03-30 DIAGNOSIS — Z950 Presence of cardiac pacemaker: Secondary | ICD-10-CM | POA: Diagnosis not present

## 2015-03-30 LAB — CUP PACEART INCLINIC DEVICE CHECK
Battery Voltage: 3.17 V
Brady Statistic RA Percent Paced: 9.3 %
Date Time Interrogation Session: 20170313130553
Implantable Lead Implant Date: 20170303
Implantable Lead Location: 753859
Lead Channel Impedance Value: 462.5 Ohm
Lead Channel Pacing Threshold Amplitude: 0.75 V
Lead Channel Pacing Threshold Pulse Width: 0.5 ms
Lead Channel Pacing Threshold Pulse Width: 0.5 ms
Lead Channel Setting Pacing Amplitude: 0.875
Lead Channel Setting Pacing Amplitude: 3.5 V
MDC IDC LEAD IMPLANT DT: 20170303
MDC IDC LEAD LOCATION: 753860
MDC IDC MSMT BATTERY REMAINING LONGEVITY: 121.2
MDC IDC MSMT LEADCHNL RA SENSING INTR AMPL: 5 mV
MDC IDC MSMT LEADCHNL RV IMPEDANCE VALUE: 575 Ohm
MDC IDC MSMT LEADCHNL RV PACING THRESHOLD AMPLITUDE: 0.75 V
MDC IDC SET LEADCHNL RV PACING PULSEWIDTH: 0.5 ms
MDC IDC SET LEADCHNL RV SENSING SENSITIVITY: 4 mV
MDC IDC STAT BRADY RV PERCENT PACED: 99.96 %
Pulse Gen Model: 2240
Pulse Gen Serial Number: 7888922

## 2015-03-30 NOTE — Progress Notes (Signed)
Wound check appointment. Steri-strips previously removed by SNF. Wound without redness or edema. Incision edges approximated, wound well healed. Normal device function. Thresholds, sensing, and impedances consistent with implant measurements. Device programmed at 3.5V/auto capture programmed on for extra safety margin until 3 month visit. Histogram distribution appropriate for patient and level of activity. 1 mode switch (<1%), AT, duration 10sec, +ASA 81mg . No high ventricular rates noted. Patient educated about wound care, arm mobility, lifting restrictions. Patient's wife given written instructions for SNF. ROV in 3 months with GT.

## 2015-04-09 ENCOUNTER — Ambulatory Visit: Payer: Self-pay | Admitting: Internal Medicine

## 2015-04-15 ENCOUNTER — Telehealth: Payer: Self-pay

## 2015-04-15 NOTE — Telephone Encounter (Signed)
LVM for pt to call back as soon as possible.   RE: Flu Vaccine for this flu season.   

## 2015-04-26 LAB — TSH: TSH: 4.03 u[IU]/mL (ref 0.41–5.90)

## 2015-04-26 LAB — HEMOGLOBIN A1C: Hemoglobin A1C: 9.2

## 2015-04-27 LAB — CBC AND DIFFERENTIAL
HEMATOCRIT: 40 % — AB (ref 41–53)
Hemoglobin: 13.4 g/dL — AB (ref 13.5–17.5)
Platelets: 188 10*3/uL (ref 150–399)
WBC: 6.8 10*3/mL

## 2015-04-27 LAB — BASIC METABOLIC PANEL: GLUCOSE: 224 mg/dL

## 2015-04-29 LAB — LIPID PANEL
Cholesterol: 239 mg/dL — AB (ref 0–200)
HDL: 36 mg/dL (ref 35–70)
TRIGLYCERIDES: 692 mg/dL — AB (ref 40–160)

## 2015-05-03 LAB — HEPATIC FUNCTION PANEL
ALT: 34 U/L (ref 10–40)
AST: 18 U/L (ref 14–40)
Alkaline Phosphatase: 40 U/L (ref 25–125)
BILIRUBIN, TOTAL: 0.5 mg/dL

## 2015-05-03 LAB — BASIC METABOLIC PANEL
BUN: 20 mg/dL (ref 4–21)
Creatinine: 1.1 mg/dL (ref 0.6–1.3)
GLUCOSE: 192 mg/dL
Potassium: 3.7 mmol/L (ref 3.4–5.3)
SODIUM: 141 mmol/L (ref 137–147)

## 2015-05-08 ENCOUNTER — Encounter: Payer: Self-pay | Admitting: Internal Medicine

## 2015-05-08 ENCOUNTER — Non-Acute Institutional Stay (SKILLED_NURSING_FACILITY): Payer: Medicare Other | Admitting: Internal Medicine

## 2015-05-08 DIAGNOSIS — E559 Vitamin D deficiency, unspecified: Secondary | ICD-10-CM

## 2015-05-08 DIAGNOSIS — I110 Hypertensive heart disease with heart failure: Secondary | ICD-10-CM | POA: Diagnosis not present

## 2015-05-08 DIAGNOSIS — F03918 Unspecified dementia, unspecified severity, with other behavioral disturbance: Secondary | ICD-10-CM

## 2015-05-08 DIAGNOSIS — R45851 Suicidal ideations: Secondary | ICD-10-CM | POA: Diagnosis not present

## 2015-05-08 DIAGNOSIS — E785 Hyperlipidemia, unspecified: Secondary | ICD-10-CM | POA: Diagnosis not present

## 2015-05-08 DIAGNOSIS — N400 Enlarged prostate without lower urinary tract symptoms: Secondary | ICD-10-CM

## 2015-05-08 DIAGNOSIS — F319 Bipolar disorder, unspecified: Secondary | ICD-10-CM

## 2015-05-08 DIAGNOSIS — E1142 Type 2 diabetes mellitus with diabetic polyneuropathy: Secondary | ICD-10-CM | POA: Diagnosis not present

## 2015-05-08 DIAGNOSIS — I25708 Atherosclerosis of coronary artery bypass graft(s), unspecified, with other forms of angina pectoris: Secondary | ICD-10-CM | POA: Diagnosis not present

## 2015-05-08 DIAGNOSIS — E1151 Type 2 diabetes mellitus with diabetic peripheral angiopathy without gangrene: Secondary | ICD-10-CM | POA: Diagnosis not present

## 2015-05-08 DIAGNOSIS — F0391 Unspecified dementia with behavioral disturbance: Secondary | ICD-10-CM

## 2015-05-08 DIAGNOSIS — I5022 Chronic systolic (congestive) heart failure: Secondary | ICD-10-CM

## 2015-05-08 DIAGNOSIS — F3130 Bipolar disorder, current episode depressed, mild or moderate severity, unspecified: Secondary | ICD-10-CM

## 2015-05-08 NOTE — Progress Notes (Signed)
Patient ID: [name], male   DOB: 30-Jul-1940, 75 y.o.   MRN: [id number] MRN: [id number] Name: [name]  Sex: male Age: 75 y.o. DOB: 1940-10-28  [place] #: [name]/Room: 308 D Level Of Care: SNF Provider: [name] Emergency Contacts: Extended Emergency Contact Information Primary Emergency Contact: [name],Victor Address: [address]          [place], [place] [address] [name] Phone: [phone] Mobile Phone: [phone] Relation: Spouse Secondary Emergency Contact: Victor Sitter, [name] Phone: [phone] Mobile Phone: [phone] Relation: Daughter  Code Status:   Allergies: Dexedrine and Oxycodone  Chief Complaint  Patient presents with  . Readmit To SNF    HPI: Patient is 75 y.o. male who was admitted to [facility] from 4/9-19 where he was involuntarily committed for increasingly agitated and irritable mood, non compliance with meds, resulting in very high BS and for talking about suicide and keeping his plan secret. Pt's medications were adjusted, he was compliant with his medications and pt was deemed no longer suicidal. Pt was admitted back to SNF for residential care. On admit pt was told that his brother had died. While at SNF pt will be followed for DM2, tx with insulin and glucophage, HTN, tx with coregand cardura and dementia, tx with aricept.  Past Medical History  Diagnosis Date  . CAD (coronary artery disease)     a. s/p CABG  . Obesity   . BPH (benign prostatic hypertrophy)   . UNIVERSAL ULCERATIVE COLITIS         . Ischemic cardiomyopathy    . DM         . OBSTRUCTIVE SLEEP APNEA         . HYPERTENSION         . Hyperlipidemia    . Gout    . Bipolar affective disorder ([place])    . TIA (transient ischemic attack)    . Chronic pain    . CVA (cerebral infarction)      Small left occipital  . Narcolepsy    . History of alcohol abuse    .  Depression    . [facility] spotted fever   . Benign neoplasm of colon      Adenomatous polyps  . 2Nd degree AV block     a. s/p STJ dual chamber pacemaker 11/2014    Past Surgical History  Procedure Laterality Date  . Coronary artery bypass graft      Median sternotomy, extracorporeal circulation,  coronary artery bypass graft surgery x4 using a sequential left internal   mammary artery graft to the mid and distal left anterior descending, a   saphenous vein graft to diagonal branch of the left anterior descending,   and a saphenous vein graft to the obtuse marginal branch of left   circumflex coronary artery.    . Cardiac catheterization      Triple-vessel coronary artery disease. Low-normal left ventricular systolic function with mild   anteroapical wall motion abnormality.   . Tonsilectomy, adenoidectomy, bilateral myringotomy and tubes  1946  . Multiple extractions with alveoloplasty  12/01/2011    Procedure: MULTIPLE EXTRACION WITH ALVEOLOPLASTY;  Surgeon: Lenn Cal, DDS;  Location: [facility];  Service: Oral Surgery;  Laterality: N/A;  Extraction of tooth #'s 3,4,5,6,7,8,9,10,11,12,13,14,15,16,17,20,21,22,23,24,25,26,27,28,29,30,31,32 with alveoloplasty and bilateral mandibular lingual tori  . Flexible sigmoidoscopy  01/17/2012    Procedure:  FLEXIBLE SIGMOIDOSCOPY;  Surgeon: Lafayette Dragon, MD;  Location: [facility];  Service: Endoscopy;  Laterality: N/A;  . Ep implantable device N/A 12/12/2014    Procedure: Pacemaker Implant;  Surgeon: [name], MD;  Location: [facility];  Service: Cardiovascular;  Laterality: N/A;  . Ep implantable device N/A 01/28/2015    Procedure: PPM Generator Removal;  Surgeon: [name], MD;  Location: [facility];  Service: Cardiovascular;  Laterality: N/A;  . Cardiac catheterization N/A 01/28/2015    Procedure: Temporary Pacemaker;  Surgeon: [name], MD;  Location: [facility];  Service: Cardiovascular;   Laterality: N/A;  . Ep implantable device N/A 03/20/2015    Procedure: Pacemaker Implant;  Surgeon: [name], MD;  Location: [facility];  Service: Cardiovascular;  Laterality: N/A;      Medication List       This list is accurate as of: 05/08/15 11:59 PM.  Always use your most recent med list.               aspirin 81 MG chewable tablet  Chew 81 mg by mouth at bedtime.     carvedilol 3.125 MG tablet  Commonly known as:  COREG  Take 3.125 mg by mouth 2 (two) times daily with a meal. Hold for SBP less than 105     clonazePAM 0.5 MG tablet  Commonly known as:  KLONOPIN  Take 1 tablet (0.5 mg total) by mouth See admin instructions. Take 1 tablet (0.5 mg) by mouth daily at bedtime, may also take 1 tablet (0.5 mg) 3 times daily as needed for anxiety     donepezil 5 MG tablet  Commonly known as:  ARICEPT  Take 10 mg by mouth at bedtime.     doxazosin 1 MG tablet  Commonly known as:  CARDURA  Take 1 mg by mouth at bedtime.     Fish Oil 1000 MG Caps  Take 1 capsule by mouth 2 (two) times daily.     fluticasone 50 MCG/ACT nasal spray  Commonly known as:  FLONASE  Place 2 sprays into both nostrils daily. For allergies     gabapentin 100 MG capsule  Commonly known as:  NEURONTIN  Take 100 mg by mouth at bedtime.     HUMALOG KWIKPEN 100 UNIT/ML KiwkPen  Generic drug:  insulin lispro  Inject 4-10 Units into the skin 4 (four) times daily. Per sliding scale: CBG 201-25 4 units, 251-300 6 units, 301-350 8 units, 351-400 10 units, >400 12 units and call MD (at 6:30am, 11:30am, 4:30 pm, 9pm)     insulin glargine 100 UNIT/ML injection  Commonly known as:  LANTUS  Inject 50 Units into the skin 2 (two) times daily.     ketoconazole 2 % cream  Commonly known as:  NIZORAL  Apply topically to rash twice daily.     lamoTRIgine 100 MG tablet  Commonly known as:  LAMICTAL  Take 100 mg by mouth 2 (two) times daily. 10am     magnesium oxide 400 MG tablet  Commonly known as:   MAG-OX  Take 400 mg by mouth daily.     metFORMIN 500 MG tablet  Commonly known as:  GLUCOPHAGE  Take 500 mg by mouth 2 (two) times daily with a meal.     nitroGLYCERIN 0.4 MG SL tablet  Commonly known as:  NITROSTAT  Place 0.4 mg under the tongue every 5 (five) minutes as needed for chest pain.  OLANZapine 2.5 MG tablet  Commonly known as:  ZYPREXA  Take 2.5 mg by mouth at bedtime.     pravastatin 40 MG tablet  Commonly known as:  PRAVACHOL  Take 40 mg by mouth at bedtime.     ROBAFEN 100 MG/5ML syrup  Generic drug:  guaifenesin  Take 100 mg by mouth every 8 (eight) hours as needed for cough.     traZODone 50 MG tablet  Commonly known as:  DESYREL  Take 50 mg by mouth at bedtime.     Vitamin D (Ergocalciferol) 50000 units Caps capsule  Commonly known as:  DRISDOL  Take 50,000 Units by mouth every Monday.        Meds ordered this encounter  Medications  . Omega-3 Fatty Acids (FISH OIL) 1000 MG CAPS    Sig: Take 1 capsule by mouth 2 (two) times daily.  . metFORMIN (GLUCOPHAGE) 500 MG tablet    Sig: Take 500 mg by mouth 2 (two) times daily with a meal.  . OLANZapine (ZYPREXA) 2.5 MG tablet    Sig: Take 2.5 mg by mouth at bedtime.  . pravastatin (PRAVACHOL) 40 MG tablet    Sig: Take 40 mg by mouth at bedtime.  . insulin glargine (LANTUS) 100 UNIT/ML injection    Sig: Inject 50 Units into the skin 2 (two) times daily.  [name] ketoconazole (NIZORAL) 2 % cream    Sig: Apply topically to rash twice daily.    There is no immunization history for the selected administration types on file for this patient.  Social History  Substance Use Topics  . Smoking status: Former Smoker    Quit date: 01/18/2008  . Smokeless tobacco: Former Systems developer  . Alcohol Use: No    Family history is  + CHF  Review of Systems  DATA OBTAINED: from patient, wife GENERAL:  no fevers, fatigue, appetite changes SKIN: No itching, rash or wounds EYES: No eye pain, redness, discharge EARS: No  earache, tinnitus, change in hearing NOSE: No congestion, drainage or bleeding  MOUTH/THROAT: No mouth or tooth pain, No sore throat RESPIRATORY: No cough, wheezing, SOB CARDIAC: No chest pain, palpitations, lower extremity edema  GI: No abdominal pain, No N/V/D or constipation, No heartburn or reflux  GU: No dysuria, frequency or urgency, or incontinence  MUSCULOSKELETAL: No unrelieved bone/joint pain NEUROLOGIC: No headache, dizziness or focal weakness PSYCHIATRIC: handling death of brother well  Filed Vitals:   05/08/15 1059  BP: 136/89  Pulse: 84  Temp: 98.3 F (36.8 C)  Resp: 18    SpO2 Readings from Last 1 Encounters:  03/21/15 93%        Physical Exam  GENERAL APPEARANCE: Alert, conversant,  No acute distress.  SKIN: No diaphoresis rash HEAD: Normocephalic, atraumatic  EYES: Conjunctiva/lids clear. Pupils round, reactive. EOMs intact.  EARS: External exam WNL, canals clear. Hearing grossly normal.  NOSE: No deformity or discharge.  MOUTH/THROAT: Lips w/o lesions  RESPIRATORY: Breathing is even, unlabored. Lung sounds are clear   CARDIOVASCULAR: Heart RRR no murmurs, rubs or gallops. No peripheral edema.   GASTROINTESTINAL: Abdomen is soft, non-tender, not distended w/ normal bowel sounds. GENITOURINARY: Bladder non tender, not distended  MUSCULOSKELETAL: No abnormal joints or musculature NEUROLOGIC:  Cranial nerves 2-12 grossly intact. Moves all extremities  PSYCHIATRIC: Mood and affect appropriate to situation, no behavioral issues  Patient Active Problem List   Diagnosis Date Noted  . Suicidal ideation 05/10/2015  . Bipolar depression ([place]) 05/10/2015  . Complete heart block ([place]) 03/20/2015  .  Bradycardia 03/14/2015  . Dementia with behavioral disturbance 02/15/2015  . Staphylococcus aureus bacteremia with sepsis ([place]) 01/28/2015  . Infection of pacemaker pocket ([place]) 01/28/2015  . Acute respiratory failure ([place]) 01/26/2015  . AKI (acute kidney injury)  ([place]) 01/26/2015  . Acute encephalopathy 01/26/2015  . Altered mental status   . Vitamin D deficiency 01/07/2015  . Mobitz type 2 second degree atrioventricular block 12/12/2014  . Pacemaker 12/12/2014  . Symptomatic bradycardia 12/03/2014  . Lower back pain 06/12/2012  . Right hip pain 06/12/2012  . Intertrigo 05/09/2012  . Insomnia 04/19/2012  . General weakness 03/07/2012  . 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 Diarrhea 01/17/2012  . Pyelonephritis 01/15/2012  . Diabetic peripheral neuropathy ([place]) 09/28/2011  . Urinary incontinence 09/28/2011  . Preventative health care 09/23/2011  . Gout 09/23/2011  . Bipolar affective disorder ([place]) 09/23/2011  . TIA (transient ischemic attack) 09/23/2011  . Chronic pain 09/23/2011  . DNR (do not resuscitate) 09/23/2011  . CVA (cerebral infarction) 09/23/2011  . 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 History of alcohol abuse 09/23/2011  . BPH (benign prostatic hypertrophy)   . Left ventricular systolic dysfunction   . Chronic systolic CHF (congestive heart failure) ([place]) 05/05/2011  . Edema 01/03/2011  . CAD (coronary artery disease) 05/18/2010  . Hyperlipidemia 05/18/2010  . Other abnormality of urination(788.69) 11/25/2009  . Other specified forms of chronic ischemic heart disease 05/19/2009  . CAD, ARTERY BYPASS GRAFT 11/18/2008  . AMI, INFERIOR WALL 09/30/2008  . Obstructive sleep apnea 09/16/2008  . SINUS TACHYCARDIA 09/02/2008  . DM (diabetes mellitus), type 2 with peripheral vascular complications ([place]) [id number]  . OBESITY 09/01/2008  . Hypertensive heart disease with heart failure ([place]) 09/01/2008  . TOBACCO ABUSE 07/31/2007  . 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    CBC    Component Value Date/Time   WBC 6.8 04/27/2015   WBC 6.8 03/20/2015 1038   RBC 4.43 03/20/2015 1038   HGB 13.4* 04/27/2015   HCT 40* 04/27/2015   PLT 188 04/27/2015   MCV 91.6 03/20/2015 1038   LYMPHSABS 0.7 01/26/2015 1200    MONOABS 0.9 01/26/2015 1200   EOSABS 0.0 01/26/2015 1200   BASOSABS 0.0 01/26/2015 1200    CMP     Component Value Date/Time   NA 141 05/03/2015   NA 140 03/20/2015 1038   K 3.7 05/03/2015   CL 102 03/20/2015 1038   CO2 28 03/20/2015 1038   GLUCOSE 171* 03/20/2015 1038   BUN 20 05/03/2015   BUN 23* 03/20/2015 1038   CREATININE 1.1 05/03/2015   CREATININE 1.23 03/20/2015 1038   CREATININE 1.06 12/05/2014 1536   CALCIUM 9.6 03/20/2015 1038   PROT 5.8* 02/01/2015 0245   ALBUMIN 2.2* 02/01/2015 0245   AST 18 05/03/2015   ALT 34 05/03/2015   ALKPHOS 40 05/03/2015   BILITOT 0.4 02/01/2015 0245   GFRNONAA 55* 03/20/2015 1038   GFRAA >60 03/20/2015 1038    Lab Results  Component Value Date   HGBA1C 9.2 04/26/2015     Dg Chest 2 View  03/21/2015  CLINICAL DATA:  Status post PICC pacemaker insertion EXAM: CHEST  2 VIEW COMPARISON:  January 29, 2015 FINDINGS: The left-sided pacemaker has been removed. There is a new right-sided pacemaker. There appear to be distal tips in the right atrium and right ventricle. Cardiomegaly remains. The hila and mediastinum are unchanged. No pneumothorax. No pulmonary nodules or masses. Minimal atelectasis suspected on the left. No other interval changes. IMPRESSION: Interval placement of right pacemaker  with no pneumothorax. Electronically Signed   By: Dorise Bullion III M.D   On: 03/21/2015 07:07    Not all labs, radiology exams or other studies done during hospitalization come through on my EPIC note; however they are reviewed by me.    Assessment and Plan  Suicidal ideation SNF - resolved after his stay in [facility]  Bipolar depression ([place]) SNF- pt admitted under invol  For uncontrolled bipolar and suicidal ideation; pt returns with new medication regimen- zyprexa 2.5 mg qHS, lamictal 100 mg BID, trazodone 50 mg qHS   Hypertensive heart disease with heart failure (HCC) SNF - controlled on cardura and coreg 3.125 mg BID  Chronic  systolic CHF (congestive heart failure) ([place]) SNF- cont coreg; pt not on diuretic, cont statin and fish oil  CAD (coronary artery disease) SNF - pt now on coreg, ASA, statin , fish oil; cont all  DM (diabetes mellitus), type 2 with peripheral vascular complications (H[facility]) SNF - uncontrolled;A1c 9.2, pt very non compliant with diet;cont insulin, glucophage, and statin and fish oil  Diabetic peripheral neuropathy (H[facility]) SNF - Cont neurontin , 100 mg qHS   Dementia with behavioral disturbance SNF - dementia stable;cont aricept 10 mg daily  BPH (benign prostatic hypertrophy) SNF - stable on cadyra ;cont current med  Hyperlipidemia SNF - decent control on pravachol 40 mg daily;expect full control with addition of fish oil;plan - cont current meds   Time spent > 45 min;> 50% of time with patient was spent reviewing records, labs, tests and studies, counseling and developing plan of care  Inocencio Homes, MD

## 2015-05-10 ENCOUNTER — Encounter: Payer: Self-pay | Admitting: Internal Medicine

## 2015-05-10 DIAGNOSIS — F319 Bipolar disorder, unspecified: Secondary | ICD-10-CM

## 2015-05-10 DIAGNOSIS — R45851 Suicidal ideations: Secondary | ICD-10-CM | POA: Insufficient documentation

## 2015-05-10 HISTORY — DX: Bipolar disorder, unspecified: F31.9

## 2015-05-10 NOTE — Assessment & Plan Note (Signed)
SNF - uncontrolled;A1c 9.2, pt very non compliant with diet;cont insulin, glucophage, and statin and fish oil

## 2015-05-10 NOTE — Assessment & Plan Note (Signed)
SNF - stable on cadyra ;cont current med

## 2015-05-10 NOTE — Assessment & Plan Note (Signed)
SNF - controlled on cardura and coreg 3.125 mg BID

## 2015-05-10 NOTE — Assessment & Plan Note (Signed)
SNF - dementia stable;cont aricept 10 mg daily

## 2015-05-10 NOTE — Assessment & Plan Note (Signed)
SNF - pt now on coreg, ASA, statin , fish oil; cont all

## 2015-05-10 NOTE — Assessment & Plan Note (Signed)
SNF - resolved after his stay in [facility]

## 2015-05-10 NOTE — Assessment & Plan Note (Signed)
SNF- pt admitted under invol  For uncontrolled bipolar and suicidal ideation; pt returns with new medication regimen- zyprexa 2.5 mg qHS, lamictal 100 mg BID, trazodone 50 mg qHS

## 2015-05-10 NOTE — Assessment & Plan Note (Addendum)
SNF - Cont neurontin , 100 mg qHS

## 2015-05-10 NOTE — Assessment & Plan Note (Signed)
SNF - decent control on pravachol 40 mg daily;expect full control with addition of fish oil;plan - cont current meds

## 2015-05-10 NOTE — Assessment & Plan Note (Signed)
SNF- cont coreg; pt not on diuretic, cont statin and fish oil

## 2015-05-13 LAB — CBC AND DIFFERENTIAL
HEMATOCRIT: 42 % (ref 41–53)
HEMOGLOBIN: 13.8 g/dL (ref 13.5–17.5)
PLATELETS: 157 10*3/uL (ref 150–399)
WBC: 7.3 10*3/mL

## 2015-05-14 LAB — BASIC METABOLIC PANEL
BUN: 18 mg/dL (ref 4–21)
Creatinine: 1 mg/dL (ref 0.6–1.3)
GLUCOSE: 240 mg/dL
Potassium: 4.2 mmol/L (ref 3.4–5.3)
SODIUM: 139 mmol/L (ref 137–147)

## 2015-05-14 LAB — HEPATIC FUNCTION PANEL
ALT: 35 U/L (ref 10–40)
AST: 24 U/L (ref 14–40)
Alkaline Phosphatase: 32 U/L (ref 25–125)
BILIRUBIN, TOTAL: 0.5 mg/dL

## 2015-05-24 LAB — HM DIABETES FOOT EXAM

## 2015-06-09 ENCOUNTER — Encounter: Payer: Self-pay | Admitting: Internal Medicine

## 2015-06-09 ENCOUNTER — Non-Acute Institutional Stay (SKILLED_NURSING_FACILITY): Payer: Medicare Other | Admitting: Internal Medicine

## 2015-06-09 DIAGNOSIS — N400 Enlarged prostate without lower urinary tract symptoms: Secondary | ICD-10-CM | POA: Diagnosis not present

## 2015-06-09 DIAGNOSIS — F0391 Unspecified dementia with behavioral disturbance: Secondary | ICD-10-CM

## 2015-06-09 DIAGNOSIS — I110 Hypertensive heart disease with heart failure: Secondary | ICD-10-CM | POA: Diagnosis not present

## 2015-06-09 DIAGNOSIS — F03918 Unspecified dementia, unspecified severity, with other behavioral disturbance: Secondary | ICD-10-CM

## 2015-06-09 NOTE — Progress Notes (Signed)
MRN: [id number] Name: [name]  Sex: male Age: 75 y.o. DOB: Jan 23, 1940  [place] #: [facility] Facility/Room:308 D Level Of Care: SNF Provider: Noah [name]. Victor Nawrot,MD Emergency Contacts: Extended Emergency Contact Information Primary Emergency Contact: [name] Address: [address]          [place], [place] [address] [name] Phone: [phone] Mobile Phone: [phone] Relation: Spouse Secondary Emergency Contact: [name], [name] [name] Phone: [phone] Mobile Phone: [phone] Relation: Daughter  Code Status: DNR  Allergies: Dexedrine and Oxycodone  Chief Complaint  Patient presents with  . Medical Management of Chronic Issues    Routine Visit    HPI: Patient is 75 y.o. male who is being seen for routine issues of BPH, dementia and HTN.  Past Medical History  Diagnosis Date  . CAD (coronary artery disease)     a. s/p CABG  . Obesity   . BPH (benign prostatic hypertrophy)   . UNIVERSAL ULCERATIVE COLITIS         . Ischemic cardiomyopathy    . DM         . OBSTRUCTIVE SLEEP APNEA         . HYPERTENSION         . Hyperlipidemia    . Gout    . Bipolar affective disorder ([name])    . TIA (transient ischemic attack)    . Chronic pain    . CVA (cerebral infarction)      Small left occipital  . Narcolepsy    . History of alcohol abuse    . Depression    . [facility] spotted fever   . Benign neoplasm of colon      Adenomatous polyps  . 2Nd degree AV block     a. s/p STJ dual chamber pacemaker 11/2014    Past Surgical History  Procedure Laterality Date  . Coronary artery bypass graft      Median sternotomy, extracorporeal circulation,  coronary artery bypass graft surgery x4 using a sequential left internal   mammary artery graft to the mid and distal left anterior descending, a   saphenous vein graft to diagonal branch of the left anterior descending,   and a saphenous vein  graft to the obtuse marginal branch of left   circumflex coronary artery.    . Cardiac catheterization      Triple-vessel coronary artery disease. Low-normal left ventricular systolic function with mild   anteroapical wall motion abnormality.   . Tonsilectomy, adenoidectomy, bilateral myringotomy and tubes  1946  . Multiple extractions with alveoloplasty  12/01/2011    Procedure: MULTIPLE EXTRACION WITH ALVEOLOPLASTY;  Surgeon: Lenn Cal, DDS;  Location: [place];  Service: Oral Surgery;  Laterality: N/A;  Extraction of tooth #'s 3,4,5,6,7,8,9,10,11,12,13,14,15,16,17,20,21,22,23,24,25,26,27,28,29,30,31,32 with alveoloplasty and bilateral mandibular lingual tori  . Flexible sigmoidoscopy  01/17/2012    Procedure: FLEXIBLE SIGMOIDOSCOPY;  Surgeon: Lafayette Dragon, MD;  [facility];  Service: Endoscopy;  Laterality: N/A;  . Ep implantable device N/A 12/12/2014    Procedure: Pacemaker Implant;  Surgeon: [name], MD;  Location: [facility];  Service: Cardiovascular;  Laterality: N/A;  . Ep implantable device N/A 01/28/2015    Procedure: PPM Generator Removal;  Surgeon: [name], MD;  Location: [facility];  Service: Cardiovascular;  Laterality: N/A;  . Cardiac catheterization N/A 01/28/2015    Procedure: Temporary Pacemaker;  Surgeon: Champ Mungo  Lovena Le, MD;  Location: [facility];  Service: Cardiovascular;  Laterality: N/A;  . Ep implantable device N/A 03/20/2015    Procedure: Pacemaker Implant;  Surgeon: [name], MD;  Location: [facility];  Service: Cardiovascular;  Laterality: N/A;      Medication List       This list is accurate as of: 06/09/15 11:59 PM.  Always use your most recent med list.               aspirin 81 MG chewable tablet  Chew 81 mg by mouth at bedtime.     carvedilol 3.125 MG tablet  Commonly known as:  COREG  Take 3.125 mg by mouth 2 (two) times daily with a meal. Hold for SBP less than 105     clonazePAM 0.5 MG  tablet  Commonly known as:  KLONOPIN  Take 1 tablet (0.5 mg total) by mouth See admin instructions. Take 1 tablet (0.5 mg) by mouth daily at bedtime, may also take 1 tablet (0.5 mg) 3 times daily as needed for anxiety     donepezil 5 MG tablet  Commonly known as:  ARICEPT  Take 10 mg by mouth at bedtime.     doxazosin 1 MG tablet  Commonly known as:  CARDURA  Take 1 mg by mouth at bedtime.     Fish Oil 1000 MG Caps  Take 1 capsule by mouth 2 (two) times daily.     fluticasone 50 MCG/ACT nasal spray  Commonly known as:  FLONASE  Place 2 sprays into both nostrils daily. For allergies     gabapentin 100 MG capsule  Commonly known as:  NEURONTIN  Give 100 mg by mouth twice a day and give 200 mg by mouth at bedtime.     HUMALOG KWIKPEN 100 UNIT/ML KiwkPen  Generic drug:  insulin lispro  Inject 4-10 Units into the skin 4 (four) times daily. Per sliding scale: CBG 201-25 4 units, 251-300 6 units, 301-350 8 units, 351-400 10 units, >400 12 units and call MD (at 6:30am, 11:30am, 4:30 pm, 9pm)     insulin glargine 100 UNIT/ML injection  Commonly known as:  LANTUS  Inject 50 Units into the skin 2 (two) times daily.     lamoTRIgine 100 MG tablet  Commonly known as:  LAMICTAL  Take 100 mg by mouth 2 (two) times daily. 10am     magnesium oxide 400 MG tablet  Commonly known as:  MAG-OX  Take 400 mg by mouth daily.     metFORMIN 500 MG tablet  Commonly known as:  GLUCOPHAGE  Take 500 mg by mouth 2 (two) times daily with a meal.     nitroGLYCERIN 0.4 MG SL tablet  Commonly known as:  NITROSTAT  Place 0.4 mg under the tongue every 5 (five) minutes as needed for chest pain.     OLANZapine 10 MG tablet  Commonly known as:  ZYPREXA  Give one half tablet (5 mg) by mouth at night     pravastatin 40 MG tablet  Commonly known as:  PRAVACHOL  Take 40 mg by mouth at bedtime.     ROBAFEN 100 MG/5ML syrup  Generic drug:  guaifenesin  Take 100 mg by mouth every 8 (eight) hours as needed  for cough.     traZODone 50 MG tablet  Commonly known as:  DESYREL  Take 50 mg by mouth at bedtime.     Vitamin D (Ergocalciferol) 50000 units Caps capsule  Commonly  known as:  DRISDOL  Take 50,000 Units by mouth every Monday.        Meds ordered this encounter  Medications  . OLANZapine (ZYPREXA) 10 MG tablet    Sig: Give one half tablet (5 mg) by mouth at night    There is no immunization history for the selected administration types on file for this patient.  Social History  Substance Use Topics  . Smoking status: Former Smoker    Quit date: 01/18/2008  . Smokeless tobacco: Former Systems developer  . Alcohol Use: No    Review of Systems  DATA OBTAINED: from patient, nurse GENERAL:  no fevers, fatigue, appetite changes SKIN: No itching, rash HEENT: No complaint RESPIRATORY: No cough, wheezing, SOB CARDIAC: No chest pain, palpitations, lower extremity edema  GI: No abdominal pain, No N/V/D or constipation, No heartburn or reflux  GU: No dysuria, frequency or urgency, or incontinence  MUSCULOSKELETAL: No unrelieved bone/joint pain NEUROLOGIC: No headache, dizziness  PSYCHIATRIC: No overt anxiety or sadness  Filed Vitals:   06/09/15 1322  BP: 157/96  Pulse: 72  Temp: 98.3 F (36.8 C)  Resp: 20    Physical Exam  GENERAL APPEARANCE: Alert, conversant, No acute distress  SKIN: No diaphoresis rash HEENT: Unremarkable RESPIRATORY: Breathing is even, unlabored. Lung sounds are clear   CARDIOVASCULAR: Heart RRR no murmurs, rubs or gallops. No peripheral edema  GASTROINTESTINAL: Abdomen is soft, non-tender, not distended w/ normal bowel sounds.  GENITOURINARY: Bladder non tender, not distended  MUSCULOSKELETAL: No abnormal joints or musculature NEUROLOGIC: Cranial nerves 2-12 grossly intact. Moves all extremities PSYCHIATRIC: Mood and affect appropriate to situation, no behavioral issues  Patient Active Problem List   Diagnosis Date Noted  . Suicidal ideation 05/10/2015   . Bipolar depression ([place]) 05/10/2015  . Complete heart block ([place]) 03/20/2015  . Bradycardia 03/14/2015  . Dementia with behavioral disturbance 02/15/2015  . Staphylococcus aureus bacteremia with sepsis ([place]) 01/28/2015  . Infection of pacemaker pocket ([place]) 01/28/2015  . Acute respiratory failure ([place]) 01/26/2015  . AKI (acute kidney injury) ([place]) 01/26/2015  . Acute encephalopathy 01/26/2015  . Altered mental status   . Vitamin D deficiency 01/07/2015  . Mobitz type 2 second degree atrioventricular block 12/12/2014  . Pacemaker 12/12/2014  . Symptomatic bradycardia 12/03/2014  . Lower back pain 06/12/2012  . Right hip pain 06/12/2012  . Intertrigo 05/09/2012  . Insomnia 04/19/2012  . General weakness 03/07/2012  . 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 Diarrhea 01/17/2012  . Pyelonephritis 01/15/2012  . Diabetic peripheral neuropathy ([place]) 09/28/2011  . Urinary incontinence 09/28/2011  . Preventative health care 09/23/2011  . Gout 09/23/2011  . Bipolar affective disorder ([place]) 09/23/2011  . TIA (transient ischemic attack) 09/23/2011  . Chronic pain 09/23/2011  . DNR (do not resuscitate) 09/23/2011  . CVA (cerebral infarction) 09/23/2011  . 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 History of alcohol abuse 09/23/2011  . BPH (benign prostatic hypertrophy)   . Left ventricular systolic dysfunction   . Chronic systolic CHF (congestive heart failure) ([place]) 05/05/2011  . Edema 01/03/2011  . CAD (coronary artery disease) 05/18/2010  . Hyperlipidemia 05/18/2010  . Other abnormality of urination(788.69) 11/25/2009  . Other specified forms of chronic ischemic heart disease 05/19/2009  . CAD, ARTERY BYPASS GRAFT 11/18/2008  . AMI, INFERIOR WALL 09/30/2008  . Obstructive sleep apnea 09/16/2008  . SINUS TACHYCARDIA 09/02/2008  . DM (diabetes mellitus), type 2 with peripheral vascular complications ([place]) [id number]  . OBESITY 09/01/2008  . Hypertensive heart disease with  heart  failure ([place]) 09/01/2008  . TOBACCO ABUSE 07/31/2007  . 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    CBC    Component Value Date/Time   WBC 7.3 05/13/2015   WBC 6.8 03/20/2015 1038   RBC 4.43 03/20/2015 1038   HGB 13.8 05/13/2015   HCT 42 05/13/2015   PLT 157 05/13/2015   MCV 91.6 03/20/2015 1038   LYMPHSABS 0.7 01/26/2015 1200   MONOABS 0.9 01/26/2015 1200   EOSABS 0.0 01/26/2015 1200   BASOSABS 0.0 01/26/2015 1200    CMP     Component Value Date/Time   NA 139 05/14/2015   NA 140 03/20/2015 1038   K 4.2 05/14/2015   CL 102 03/20/2015 1038   CO2 28 03/20/2015 1038   GLUCOSE 171* 03/20/2015 1038   BUN 18 05/14/2015   BUN 23* 03/20/2015 1038   CREATININE 1.0 05/14/2015   CREATININE 1.23 03/20/2015 1038   CREATININE 1.06 12/05/2014 1536   CALCIUM 9.6 03/20/2015 1038   PROT 5.8* 02/01/2015 0245   ALBUMIN 2.2* 02/01/2015 0245   AST 24 05/14/2015   ALT 35 05/14/2015   ALKPHOS 32 05/14/2015   BILITOT 0.4 02/01/2015 0245   GFRNONAA 55* 03/20/2015 1038   GFRAA >60 03/20/2015 1038    Assessment and Plan  BPH (benign prostatic hypertrophy) No reported problems;cont cardura  Dementia with behavioral disturbance Continues stable; cont aricept 10 mg daily  Hypertensive heart disease with heart failure (HCC) Cont controlled on cardura and coreg 3.125 BID    Thaniel Coluccio D. [name], MD

## 2015-06-12 NOTE — Assessment & Plan Note (Signed)
No reported problems;cont cardura

## 2015-06-12 NOTE — Assessment & Plan Note (Signed)
Cont controlled on cardura and coreg 3.125 BID

## 2015-06-12 NOTE — Assessment & Plan Note (Signed)
Continues stable; cont aricept 10 mg daily

## 2015-06-23 ENCOUNTER — Encounter: Payer: Self-pay | Admitting: Internal Medicine

## 2015-06-23 ENCOUNTER — Ambulatory Visit (INDEPENDENT_AMBULATORY_CARE_PROVIDER_SITE_OTHER): Payer: Medicare Other | Admitting: Internal Medicine

## 2015-06-23 VITALS — BP 124/76 | HR 72 | Ht 69.0 in

## 2015-06-23 DIAGNOSIS — I442 Atrioventricular block, complete: Secondary | ICD-10-CM

## 2015-06-23 LAB — CUP PACEART INCLINIC DEVICE CHECK
Battery Remaining Longevity: 120 mo
Battery Voltage: 3.02 V
Brady Statistic RA Percent Paced: 5.1 %
Brady Statistic RV Percent Paced: 99.98 %
Date Time Interrogation Session: 20170606153715
Implantable Lead Implant Date: 20170303
Implantable Lead Location: 753859
Lead Channel Impedance Value: 437.5 Ohm
Lead Channel Pacing Threshold Pulse Width: 0.5 ms
Lead Channel Pacing Threshold Pulse Width: 0.5 ms
Lead Channel Setting Pacing Amplitude: 1.125
Lead Channel Setting Pacing Amplitude: 3.5 V
Lead Channel Setting Pacing Pulse Width: 0.5 ms
Lead Channel Setting Sensing Sensitivity: 4 mV
MDC IDC LEAD IMPLANT DT: 20170303
MDC IDC LEAD LOCATION: 753860
MDC IDC MSMT LEADCHNL RA PACING THRESHOLD AMPLITUDE: 0.75 V
MDC IDC MSMT LEADCHNL RA SENSING INTR AMPL: 4.7 mV
MDC IDC MSMT LEADCHNL RV IMPEDANCE VALUE: 612.5 Ohm
MDC IDC MSMT LEADCHNL RV PACING THRESHOLD AMPLITUDE: 0.875 V
Pulse Gen Serial Number: 7888922

## 2015-06-23 NOTE — Patient Instructions (Signed)
Medication Instructions:  Your physician recommends that you continue on your current medications as directed. Please refer to the Current Medication list given to you today.   Labwork: None ordered   Testing/Procedures: None ordered   Follow-Up: Your physician wants you to follow-up in: 9 months with Dr Taylor You will receive a reminder letter in the mail two months in advance. If you don't receive a letter, please call our office to schedule the follow-up appointment.   Any Other Special Instructions Will Be Listed Below (If Applicable).     If you need a refill on your cardiac medications before your next appointment, please call your pharmacy.   

## 2015-06-23 NOTE — Progress Notes (Signed)
HPI [name] returns today for followup. He is a pleasant 75 yo man with a h/o complete heart block who underwent PPM insertion and had this complicated by a PPM infection with MRSA. He has undergone device removal and insertion of a temporary perm PM, a full course of IV anti-biotics and re-implantation of a new PPM on the contralateral side. He has done well in the interim. No chest pain or sob.  Allergies  Allergen Reactions  . Dexedrine [Dextroamphetamine Sulfate Er] Other (See Comments)    agitation  . Oxycodone Nausea And Vomiting     Current Outpatient Prescriptions  Medication Sig Dispense Refill  . aspirin 81 MG chewable tablet Chew 81 mg by mouth at bedtime.    . carvedilol (COREG) 3.125 MG tablet Take 3.125 mg by mouth 2 (two) times daily with a meal. Hold for SBP less than 105    . clonazePAM (KLONOPIN) 0.5 MG tablet Take 1 tablet (0.5 mg total) by mouth See admin instructions. Take 1 tablet (0.5 mg) by mouth daily at bedtime, may also take 1 tablet (0.5 mg) 3 times daily as needed for anxiety 20 tablet 0  . donepezil (ARICEPT) 10 MG tablet Take 10 mg by mouth at bedtime.    [name] Kitchen doxazosin (CARDURA) 1 MG tablet Take 1 mg by mouth at bedtime.     . fluticasone (FLONASE) 50 MCG/ACT nasal spray Place 2 sprays into both nostrils daily. For allergies    . gabapentin (NEURONTIN) 100 MG capsule Take 1 tablet by mouth twice daily and 2 tablets at bedtime    . guaifenesin (ROBAFEN) 100 MG/5ML syrup Take 100 mg by mouth every 8 (eight) hours as needed for cough.    . insulin glargine (LANTUS) 100 UNIT/ML injection Inject 50 Units into the skin 2 (two) times daily.    . insulin lispro (HUMALOG KWIKPEN) 100 UNIT/ML KiwkPen Inject 4-10 Units into the skin 4 (four) times daily. Per sliding scale: CBG 201-25 4 units, 251-300 6 units, 301-350 8 units, 351-400 10 units, >400 12 units and call MD (at 6:30am, 11:30am, 4:30 pm, 9pm)    . lamoTRIgine (LAMICTAL) 100 MG tablet Take 100 mg by  mouth 2 (two) times daily.     . magnesium oxide (MAG-OX) 400 MG tablet Take 400 mg by mouth daily.    . metFORMIN (GLUCOPHAGE) 500 MG tablet Take 500 mg by mouth 2 (two) times daily with a meal.    . nitroGLYCERIN (NITROSTAT) 0.4 MG SL tablet Place 0.4 mg under the tongue every 5 (five) minutes as needed for chest pain.    [name] Kitchen OLANZapine (ZYPREXA) 10 MG tablet Give one half tablet (5 mg) by mouth at night    . Omega-3 Fatty Acids (FISH OIL) 1000 MG CAPS Take 2 capsules by mouth daily.     . pravastatin (PRAVACHOL) 40 MG tablet Take 40 mg by mouth at bedtime.    . traZODone (DESYREL) 50 MG tablet Take 50 mg by mouth at bedtime.    . Vitamin D, Ergocalciferol, (DRISDOL) 50000 units CAPS capsule Take 50,000 Units by mouth every Monday.      No current facility-administered medications for this visit.     Past Medical History  Diagnosis Date  . CAD (coronary artery disease)     a. s/p CABG  . Obesity   . BPH (benign prostatic hypertrophy)   . UNIVERSAL ULCERATIVE COLITIS         . Ischemic cardiomyopathy    .  DM         . OBSTRUCTIVE SLEEP APNEA         . HYPERTENSION         . Hyperlipidemia    . Gout    . Bipolar affective disorder ([place])    . TIA (transient ischemic attack)    . Chronic pain    . CVA (cerebral infarction)      Small left occipital  . Narcolepsy    . History of alcohol abuse    . Depression    . [facility] spotted fever   . Benign neoplasm of colon      Adenomatous polyps  . 2Nd degree AV block     a. s/p STJ dual chamber pacemaker 11/2014    ROS:   All systems reviewed and negative except as noted in the HPI.   Past Surgical History  Procedure Laterality Date  . Coronary artery bypass graft      Median sternotomy, extracorporeal circulation,  coronary artery bypass graft surgery x4 using a sequential left internal   mammary artery graft to the mid and distal left anterior descending, a   saphenous vein graft to diagonal branch of the left  anterior descending,   and a saphenous vein graft to the obtuse marginal branch of left   circumflex coronary artery.    . Cardiac catheterization      Triple-vessel coronary artery disease. Low-normal left ventricular systolic function with mild   anteroapical wall motion abnormality.   . Tonsilectomy, adenoidectomy, bilateral myringotomy and tubes  1946  . Multiple extractions with alveoloplasty  12/01/2011    Procedure: MULTIPLE EXTRACION WITH ALVEOLOPLASTY;  Surgeon: Lenn Cal, DDS;  Location: [facility];  Service: Oral Surgery;  Laterality: N/A;  Extraction of tooth #'s 3,4,5,6,7,8,9,10,11,12,13,14,15,16,17,20,21,22,23,24,25,26,27,28,29,30,31,32 with alveoloplasty and bilateral mandibular lingual tori  . Flexible sigmoidoscopy  01/17/2012    Procedure: FLEXIBLE SIGMOIDOSCOPY;  Surgeon: Lafayette Dragon, MD;  [facility];  Service: Endoscopy;  Laterality: N/A;  . Ep implantable device N/A 12/12/2014    Procedure: Pacemaker Implant;  Surgeon: [name], MD;  Location: [facility];  Service: Cardiovascular;  Laterality: N/A;  . Ep implantable device N/A 01/28/2015    Procedure: PPM Generator Removal;  Surgeon: [name], MD;  Location: [facility];  Service: Cardiovascular;  Laterality: N/A;  . Cardiac catheterization N/A 01/28/2015    Procedure: Temporary Pacemaker;  Surgeon: [name], MD;  Location: [facility];  Service: Cardiovascular;  Laterality: N/A;  . Ep implantable device N/A 03/20/2015    Procedure: Pacemaker Implant;  Surgeon: [name], MD;  Location: [facility];  Service: Cardiovascular;  Laterality: N/A;     Family History  Problem Relation Age of Onset  . Diabetes Father   . Heart disease Father   . Heart attack Mother   . Aortic aneurysm Mother   . Alcohol abuse Other   . Arthritis Other   . Hyperlipidemia Other   . Heart disease Other   . Stroke Other   . Hypertension Other   . Diabetes Other   . Mental  illness Other   . Alcohol abuse Other   . Arthritis Other   . Heart disease Other   . Mental illness Other   . Diabetes Other      Social History   Social History  . Marital Status: Married    Spouse Name: N/A  . Number of Children:  2  . Years of Education: 12   Occupational History  . retired    Social History Main Topics  . Smoking status: Former Smoker    Quit date: 01/18/2008  . Smokeless tobacco: Former Systems developer  . Alcohol Use: No  . Drug Use: No  . Sexual Activity: No   Other Topics Concern  . Not on file   Social History Narrative   Patient has been married for 51 years.   Patient presents with his wife today.    Patient has 2 grown children and lives in [place] and [place] areas respectively.   Patient is a nonsmoker, nondrinker at this time.     BP 124/76 mmHg  Pulse 72  Ht 5\' 9"  (1.753 m)  Physical Exam:  stable appearing elderly man, NAD HEENT: Unremarkable Neck:  6 cm JVD, no thyromegally Lymphatics:  No adenopathy Back:  No CVA tenderness Lungs:  Clear with no wheezes HEART:  Regular rate rhythm, no murmurs, no rubs, no clicks Abd:  soft, obese, positive bowel sounds, no organomegally, no rebound, no guarding Ext:  2 plus pulses, no edema, no cyanosis, no clubbing Skin:  No rashes no nodules Neuro:  CN II through XII intact, motor grossly intact   DEVICE - normal device function  Assess/Plan: 1. PPM system infection with MRSA - he has no evidence of any residual infection. 2. Complete heart block - he is s/p insertion of a new PPM. He is asymptomatic. 3. Obesity - he has lost weight. 4. HTN - his blood pressure is well controlled. No change in meds.   Mikle Bosworth.D.

## 2015-07-08 LAB — HM DIABETES FOOT EXAM

## 2015-07-10 ENCOUNTER — Non-Acute Institutional Stay (SKILLED_NURSING_FACILITY): Payer: Medicare Other | Admitting: Internal Medicine

## 2015-07-10 ENCOUNTER — Encounter: Payer: Self-pay | Admitting: Internal Medicine

## 2015-07-10 DIAGNOSIS — E1142 Type 2 diabetes mellitus with diabetic polyneuropathy: Secondary | ICD-10-CM | POA: Diagnosis not present

## 2015-07-10 DIAGNOSIS — I25708 Atherosclerosis of coronary artery bypass graft(s), unspecified, with other forms of angina pectoris: Secondary | ICD-10-CM

## 2015-07-10 DIAGNOSIS — E1151 Type 2 diabetes mellitus with diabetic peripheral angiopathy without gangrene: Secondary | ICD-10-CM

## 2015-07-10 NOTE — Progress Notes (Signed)
MRN: [id number] Name: [name]  Sex: male Age: 75 y.o. DOB: 1940-07-17  [place] #:  Facility/Room:[facility]/308-D Level Of Care: SNF Provider:Marshe Shrestha, Webb Silversmith MD  Emergency Contacts: Extended Emergency Contact Information Primary Emergency Contact: [name] Address: [address]          [place], [place] [address] [name] of [place] Phone: [phone] Mobile Phone: [phone] Relation: Spouse Secondary Emergency Contact: [name], [name] of [place] Phone: [phone] Mobile Phone: [phone] Relation: Daughter  Code Status: DNR  Allergies: Dexedrine and Oxycodone  Chief Complaint  Patient presents with  . Medical Management of Chronic Issues    HPI: Patient is 75 y.o. male who is being seen for routine issues of DM2, diabetic peripheral neuropathy and CAD.  Past Medical History  Diagnosis Date  . CAD (coronary artery disease)     a. s/p CABG  . Obesity   . BPH (benign prostatic hypertrophy)   . UNIVERSAL ULCERATIVE COLITIS         . Ischemic cardiomyopathy    . DM         . OBSTRUCTIVE SLEEP APNEA         . HYPERTENSION         . Hyperlipidemia    . Gout    . Bipolar affective disorder ([place])    . TIA (transient ischemic attack)    . Chronic pain    . CVA (cerebral infarction)      Small left occipital  . Narcolepsy    . History of alcohol abuse    . Depression    . [facility] spotted fever   . Benign neoplasm of colon      Adenomatous polyps  . 2Nd degree AV block     a. s/p STJ dual chamber pacemaker 11/2014    Past Surgical History  Procedure Laterality Date  . Coronary artery bypass graft      Median sternotomy, extracorporeal circulation,  coronary artery bypass graft surgery x4 using a sequential left internal   mammary artery graft to the mid and distal left anterior descending, a   saphenous vein graft to diagonal branch of the left anterior descending,   and a saphenous vein  graft to the obtuse marginal branch of left   circumflex coronary artery.    . Cardiac catheterization      Triple-vessel coronary artery disease. Low-normal left ventricular systolic function with mild   anteroapical wall motion abnormality.   . Tonsilectomy, adenoidectomy, bilateral myringotomy and tubes  1946  . Multiple extractions with alveoloplasty  12/01/2011    Procedure: MULTIPLE EXTRACION WITH ALVEOLOPLASTY;  Surgeon: Lenn Cal, DDS;  Location: [facility];  Service: Oral Surgery;  Laterality: N/A;  Extraction of tooth #'s 3,4,5,6,7,8,9,10,11,12,13,14,15,16,17,20,21,22,23,24,25,26,27,28,29,30,31,32 with alveoloplasty and bilateral mandibular lingual tori  . Flexible sigmoidoscopy  01/17/2012    Procedure: FLEXIBLE SIGMOIDOSCOPY;  Surgeon: Lafayette Dragon, MD;  Location: [facility];  Service: Endoscopy;  Laterality: N/A;  . Ep implantable device N/A 12/12/2014    Procedure: Pacemaker Implant;  Surgeon: [name], MD;  Location: [facility];  Service: Cardiovascular;  Laterality: N/A;  . Ep implantable device N/A 01/28/2015    Procedure: PPM Generator Removal;  Surgeon: [name], MD;  Location: [facility];  Service: Cardiovascular;  Laterality: N/A;  . Cardiac catheterization N/A 01/28/2015    Procedure: Temporary Pacemaker;  Surgeon: [name], MD;  Location:  [facility] IN[facility]IVE [facility] LAB;  Service: Cardiovascular;  Laterality: N/A;  . Ep implantable device N/A 03/20/2015    Procedure: Pacemaker Implant;  Surgeon: [name], MD;  Location: [facility];  Service: Cardiovascular;  Laterality: N/A;      Medication List       This list is accurate as of: 07/10/15 11:59 PM.  Always use your most recent med list.               aspirin 81 MG chewable tablet  Chew 81 mg by mouth at bedtime.     carvedilol 3.125 MG tablet  Commonly known as:  COREG  Take 3.125 mg by mouth 2 (two) times daily with a meal. Hold for SBP less than 105     clonazePAM 0.5 MG  tablet  Commonly known as:  KLONOPIN  Take 1 tablet (0.5 mg total) by mouth See admin instructions. Take 1 tablet (0.5 mg) by mouth daily at bedtime, may also take 1 tablet (0.5 mg) 3 times daily as needed for anxiety     donepezil 10 MG tablet  Commonly known as:  ARICEPT  Take 10 mg by mouth at bedtime.     doxazosin 1 MG tablet  Commonly known as:  CARDURA  Take 1 mg by mouth at bedtime.     Fish Oil 1000 MG Caps  Take 2 capsules by mouth daily.     fluticasone 50 MCG/ACT nasal spray  Commonly known as:  FLONASE  Place 2 sprays into both nostrils daily. For allergies     gabapentin 100 MG capsule  Commonly known as:  NEURONTIN  Take 1 tablet by mouth twice daily and 2 tablets at bedtime     HUMALOG KWIKPEN 100 UNIT/ML KiwkPen  Generic drug:  insulin lispro  Inject 4-10 Units into the skin 4 (four) times daily. Per sliding scale: CBG 201-25 4 units, 251-300 6 units, 301-350 8 units, 351-400 10 units, >400 12 units and call MD (at 6:30am, 11:30am, 4:30 pm, 9pm)     insulin glargine 100 UNIT/ML injection  Commonly known as:  LANTUS  Inject 52 Units into the skin 2 (two) times daily.     lamoTRIgine 100 MG tablet  Commonly known as:  LAMICTAL  Take 100 mg by mouth 2 (two) times daily.     magnesium oxide 400 MG tablet  Commonly known as:  MAG-OX  Take 400 mg by mouth daily.     metFORMIN 500 MG tablet  Commonly known as:  GLUCOPHAGE  Take 500 mg by mouth 2 (two) times daily with a meal.     nitroGLYCERIN 0.4 MG SL tablet  Commonly known as:  NITROSTAT  Place 0.4 mg under the tongue every 5 (five) minutes as needed for chest pain.     OLANZapine 10 MG tablet  Commonly known as:  ZYPREXA  Give one half tablet (5 mg) by mouth at night     pravastatin 40 MG tablet  Commonly known as:  PRAVACHOL  Take 40 mg by mouth at bedtime.     ROBAFEN 100 MG/5ML syrup  Generic drug:  guaifenesin  Take 100 mg by mouth every 8 (eight) hours as needed for cough.     traZODone 50  MG tablet  Commonly known as:  DESYREL  Take 50 mg by mouth at bedtime.     Vitamin D (Ergocalciferol) 50000 units Caps capsule  Commonly known as:  DRISDOL  Take 50,000 Units by mouth every Monday.  No orders of the defined types were placed in this encounter.    There is no immunization history for the selected administration types on file for this patient.  Social History  Substance Use Topics  . Smoking status: Former Smoker    Quit date: 01/18/2008  . Smokeless tobacco: Former Systems developer  . Alcohol Use: No    Review of Systems  DATA OBTAINED: from patient GENERAL:  no fevers, fatigue, appetite changes; c/o about the service around here SKIN: No itching, rash HEENT: No complaint RESPIRATORY: No cough, wheezing, SOB CARDIAC: No chest pain, palpitations, lower extremity edema  GI: No abdominal pain, No N/V/D or constipation, No heartburn or reflux  GU: No dysuria, frequency or urgency, or incontinence  MUSCULOSKELETAL: No unrelieved bone/joint pain NEUROLOGIC: No headache, dizziness  PSYCHIATRIC: No overt anxiety or sadness  Filed Vitals:   07/10/15 0908  BP: 142/85  Pulse: 83  Temp: 97.6 F (36.4 C)  Resp: 20    Physical Exam  GENERAL APPEARANCE: Alert, conversant, No acute distress  SKIN: No diaphoresis rash HEENT: Unremarkable RESPIRATORY: Breathing is even, unlabored. Lung sounds are clear   CARDIOVASCULAR: Heart RRR no murmurs, rubs or gallops. No peripheral edema  GASTROINTESTINAL: Abdomen is soft, non-tender, not distended w/ normal bowel sounds.  GENITOURINARY: Bladder non tender, not distended  MUSCULOSKELETAL: No abnormal joints or musculature NEUROLOGIC: Cranial nerves 2-12 grossly intact. Moves all extremities PSYCHIATRIC: Mood and affect appropriate to situation, no behavioral issues  Patient Active Problem List   Diagnosis Date Noted  . Suicidal ideation 05/10/2015  . Bipolar depression ([place]) 05/10/2015  . Complete heart block ([place])  03/20/2015  . Bradycardia 03/14/2015  . Dementia with behavioral disturbance 02/15/2015  . Staphylococcus aureus bacteremia with sepsis ([place]) 01/28/2015  . Infection of pacemaker pocket ([place]) 01/28/2015  . Acute respiratory failure ([place]) 01/26/2015  . AKI (acute kidney injury) ([place]) 01/26/2015  . Acute encephalopathy 01/26/2015  . Altered mental status   . Vitamin D deficiency 01/07/2015  . Mobitz type 2 second degree atrioventricular block 12/12/2014  . Pacemaker 12/12/2014  . Symptomatic bradycardia 12/03/2014  . Lower back pain 06/12/2012  . Right hip pain 06/12/2012  . Intertrigo 05/09/2012  . Insomnia 04/19/2012  . General weakness 03/07/2012  . 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 Diarrhea 01/17/2012  . Pyelonephritis 01/15/2012  . Diabetic peripheral neuropathy ([place]) 09/28/2011  . Urinary incontinence 09/28/2011  . Preventative health care 09/23/2011  . Gout 09/23/2011  . Bipolar affective disorder ([place]) 09/23/2011  . TIA (transient ischemic attack) 09/23/2011  . Chronic pain 09/23/2011  . DNR (do not resuscitate) 09/23/2011  . CVA (cerebral infarction) 09/23/2011  . 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 History of alcohol abuse 09/23/2011  . BPH (benign prostatic hypertrophy)   . Left ventricular systolic dysfunction   . Chronic systolic CHF (congestive heart failure) ([place]) 05/05/2011  . Edema 01/03/2011  . CAD (coronary artery disease) 05/18/2010  . Hyperlipidemia 05/18/2010  . Other abnormality of urination(788.69) 11/25/2009  . Other specified forms of chronic ischemic heart disease 05/19/2009  . CAD, ARTERY BYPASS GRAFT 11/18/2008  . AMI, INFERIOR WALL 09/30/2008  . Obstructive sleep apnea 09/16/2008  . SINUS TACHYCARDIA 09/02/2008  . DM (diabetes mellitus), type 2 with peripheral vascular complications ([place]) [id number]  . OBESITY 09/01/2008  . Hypertensive heart disease with heart failure ([place]) 09/01/2008  . TOBACCO ABUSE 07/31/2007  . 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[place]) 07/31/2007    CBC    Component Value Date/Time   WBC 7.3 05/13/2015  WBC 6.8 03/20/2015 1038   RBC 4.43 03/20/2015 1038   HGB 13.8 05/13/2015   HCT 42 05/13/2015   PLT 157 05/13/2015   MCV 91.6 03/20/2015 1038   LYMPHSABS 0.7 01/26/2015 1200   MONOABS 0.9 01/26/2015 1200   EOSABS 0.0 01/26/2015 1200   BASOSABS 0.0 01/26/2015 1200    CMP     Component Value Date/Time   NA 139 05/14/2015   NA 140 03/20/2015 1038   K 4.2 05/14/2015   CL 102 03/20/2015 1038   CO2 28 03/20/2015 1038   GLUCOSE 171* 03/20/2015 1038   BUN 18 05/14/2015   BUN 23* 03/20/2015 1038   CREATININE 1.0 05/14/2015   CREATININE 1.23 03/20/2015 1038   CREATININE 1.06 12/05/2014 1536   CALCIUM 9.6 03/20/2015 1038   PROT 5.8* 02/01/2015 0245   ALBUMIN 2.2* 02/01/2015 0245   AST 24 05/14/2015   ALT 35 05/14/2015   ALKPHOS 32 05/14/2015   BILITOT 0.4 02/01/2015 0245   GFRNONAA 55* 03/20/2015 1038   GFRAA >60 03/20/2015 1038    Assessment and Plan  CAD (coronary artery disease) SNF - pt now on coreg, ASA, statin , fish oil; cont all  DM (diabetes mellitus), type 2 with peripheral vascular complications (HCC) SNF - uncontrolled;A1c 9.2, pt very non compliant with diet;cont insulin, glucophage, and statin and fish oil  Diabetic peripheral neuropathy (HCC) SNF - Cont neurontin , 100 mg qHS     Inocencio Homes  MD

## 2015-07-12 ENCOUNTER — Encounter: Payer: Self-pay | Admitting: Internal Medicine

## 2015-07-29 LAB — LIPID PANEL
CHOLESTEROL: 165 mg/dL (ref 0–200)
HDL: 35 mg/dL (ref 35–70)
TRIGLYCERIDES: 623 mg/dL — AB (ref 40–160)

## 2015-07-29 LAB — HEPATIC FUNCTION PANEL
ALT: 34 U/L (ref 10–40)
AST: 42 U/L — AB (ref 14–40)
Alkaline Phosphatase: 35 U/L (ref 25–125)
Bilirubin, Total: 0.6 mg/dL

## 2015-07-29 LAB — HEMOGLOBIN A1C: Hemoglobin A1C: 9.8

## 2015-07-29 LAB — BASIC METABOLIC PANEL
BUN: 28 mg/dL — AB (ref 4–21)
CREATININE: 1.3 mg/dL (ref 0.6–1.3)
Glucose: 323 mg/dL
POTASSIUM: 4.5 mmol/L (ref 3.4–5.3)
Sodium: 137 mmol/L (ref 137–147)

## 2015-08-10 LAB — BASIC METABOLIC PANEL
BUN: 24 mg/dL — AB (ref 4–21)
CREATININE: 1 mg/dL (ref 0.6–1.3)
GLUCOSE: 293 mg/dL
POTASSIUM: 4.1 mmol/L (ref 3.4–5.3)
SODIUM: 140 mmol/L (ref 137–147)

## 2015-08-10 LAB — HEPATIC FUNCTION PANEL
ALT: 50 U/L — AB (ref 10–40)
AST: 55 U/L — AB (ref 14–40)
Alkaline Phosphatase: 34 U/L (ref 25–125)
Bilirubin, Total: 0.6 mg/dL

## 2015-08-11 ENCOUNTER — Non-Acute Institutional Stay (SKILLED_NURSING_FACILITY): Payer: Medicare Other | Admitting: Internal Medicine

## 2015-08-11 ENCOUNTER — Encounter: Payer: Self-pay | Admitting: Internal Medicine

## 2015-08-11 DIAGNOSIS — R251 Tremor, unspecified: Secondary | ICD-10-CM | POA: Diagnosis not present

## 2015-08-11 HISTORY — DX: Tremor, unspecified: R25.1

## 2015-08-11 NOTE — Assessment & Plan Note (Addendum)
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 I am writing a neurology consult but also  For something to see if it can be improved in the meantime. Pt is already on Bblocker, don't want to use propranolol, will start primidone 0.25 mg qHS to increase to 0.5 mg qHS in 7 days.

## 2015-08-11 NOTE — Progress Notes (Signed)
MRN: [id number] Name: [name]  Sex: male Age: 75 y.o. DOB: [birth year]  [place] #:  Facility/Room: [facility] / 308 D Level Of Care: SNF Provider: [name], MD Emergency Contacts: Extended Emergency Contact Information Primary Emergency Contact: [name],[name] Address: [address]          [place], [place] [address] [name] of [place] Phone: [phone] Mobile Phone: [phone] Relation: Spouse Secondary Emergency Contact: [name], [name] of [place] Phone: [phone] Mobile Phone: [phone] Relation: Daughter  Code Status: DNR  Allergies: Dexedrine [dextroamphetamine sulfate er] and Oxycodone  Chief Complaint  Patient presents with  . Medical Management of Chronic Issues    Routine Visit    HPI: Patient is 75 y.o. male who was being seen for routine issues but I discovered he has had a new tremor for about a month.Pt admits it is at rest but worsens with intention. For instance he can not eat because he can't hold a cup or utensil. He says the PA guy (optum) has seen him with this.  Past Medical History:  Diagnosis Date  . 2Nd degree AV block    a. s/p STJ dual chamber pacemaker 11/2014  . Benign neoplasm of colon     Adenomatous polyps  . Bipolar affective disorder ([place])    . BPH (benign prostatic hypertrophy)   . CAD (coronary artery disease)    a. s/p CABG  . Chronic pain    . CVA (cerebral infarction)     Small left occipital  . Depression    . DM        . Gout    . History of alcohol abuse    . Hyperlipidemia    . HYPERTENSION        . Ischemic cardiomyopathy    . Narcolepsy    . Obesity   . OBSTRUCTIVE SLEEP APNEA        . [facility] spotted fever   . TIA (transient ischemic attack)    . UNIVERSAL ULCERATIVE COLITIS          Past Surgical History:  Procedure Laterality Date  . CARDIAC CATHETERIZATION     Triple-vessel coronary artery disease. Low-normal left  ventricular systolic function with mild   anteroapical wall motion abnormality.   [name] Kitchen CARDIAC CATHETERIZATION N/A 01/28/2015   Procedure: Temporary Pacemaker;  Surgeon: [name], MD;  Location: [facility];  Service: Cardiovascular;  Laterality: N/A;  . CORONARY ARTERY BYPASS GRAFT     Median sternotomy, extracorporeal circulation,  coronary artery bypass graft surgery x4 using a sequential left internal   mammary artery graft to the mid and distal left anterior descending, a   saphenous vein graft to diagonal branch of the left anterior descending,   and a saphenous vein graft to the obtuse marginal branch of left   circumflex coronary artery.    . EP IMPLANTABLE DEVICE N/A 12/12/2014   Procedure: Pacemaker Implant;  Surgeon: [name], MD;  Location: [facility];  Service: Cardiovascular;  Laterality: N/A;  . EP IMPLANTABLE DEVICE N/A 01/28/2015   Procedure: PPM Generator Removal;  Surgeon: [name], MD;  Location: [facility];  Service: Cardiovascular;  Laterality: N/A;  . EP IMPLANTABLE DEVICE N/A 03/20/2015   Procedure: Pacemaker Implant;  Surgeon: [name], MD;  Location: [facility];  Service: Cardiovascular;  Laterality: N/A;  . FLEXIBLE SIGMOIDOSCOPY  01/17/2012   Procedure: FLEXIBLE SIGMOIDOSCOPY;  Surgeon: Lafayette Dragon, MD;  Location: [facility];  Service: Endoscopy;  Laterality: N/A;  . MULTIPLE EXTRACTIONS WITH ALVEOLOPLASTY  12/01/2011   Procedure: MULTIPLE EXTRACION WITH ALVEOLOPLASTY;  Surgeon: Lenn Cal, DDS;  Location: [facility];  Service: Oral Surgery;  Laterality: N/A;  Extraction of tooth #'s 3,4,5,6,7,8,9,10,11,12,13,14,15,16,17,20,21,22,23,24,25,26,27,28,29,30,31,32 with alveoloplasty and bilateral mandibular lingual tori  . TONSILECTOMY, ADENOIDECTOMY, BILATERAL MYRINGOTOMY AND TUBES  1946      Medication List       Accurate as of 08/11/15 11:05 PM. Always use your most recent med list.          aspirin 81 MG  chewable tablet Chew 81 mg by mouth at bedtime.   carvedilol 3.125 MG tablet Commonly known as:  COREG Take 3.125 mg by mouth 2 (two) times daily with a meal. Hold for SBP less than 105   clonazePAM 0.5 MG tablet Commonly known as:  KLONOPIN Take 1 tablet (0.5 mg total) by mouth See admin instructions. Take 1 tablet (0.5 mg) by mouth daily at bedtime, may also take 1 tablet (0.5 mg) 3 times daily as needed for anxiety   donepezil 10 MG tablet Commonly known as:  ARICEPT Take 10 mg by mouth at bedtime.   doxazosin 1 MG tablet Commonly known as:  CARDURA Take 1 mg by mouth at bedtime.   Fish Oil 1000 MG Caps Take 2 capsules by mouth daily.   fluticasone 50 MCG/ACT nasal spray Commonly known as:  FLONASE Place 2 sprays into both nostrils daily. For allergies   gabapentin 100 MG capsule Commonly known as:  NEURONTIN Take 1 tablet by mouth daily  and 2 tablets at bedtime   HUMALOG KWIKPEN 100 UNIT/ML KiwkPen Generic drug:  insulin lispro Inject 4-10 Units into the skin 4 (four) times daily. Per sliding scale: CBG 201-25 4 units, 251-300 6 units, 301-350 8 units, 351-400 10 units, >400 12 units and call MD (at 6:30am, 11:30am, 4:30 pm, 9pm)   insulin glargine 100 UNIT/ML injection Commonly known as:  LANTUS Inject 52 Units into the skin 2 (two) times daily.   lamoTRIgine 100 MG tablet Commonly known as:  LAMICTAL Take 100 mg by mouth 2 (two) times daily.   magnesium oxide 400 MG tablet Commonly known as:  MAG-OX Take 400 mg by mouth daily.   metFORMIN 500 MG tablet Commonly known as:  GLUCOPHAGE Take 500 mg by mouth 2 (two) times daily with a meal.   nitroGLYCERIN 0.4 MG SL tablet Commonly known as:  NITROSTAT Place 0.4 mg under the tongue every 5 (five) minutes as needed for chest pain.   OLANZapine 10 MG tablet Commonly known as:  ZYPREXA Give one half tablet (5 mg) by mouth at night   pravastatin 40 MG tablet Commonly known as:  PRAVACHOL Take 40 mg by mouth  at bedtime.   ROBAFEN 100 MG/5ML syrup Generic drug:  guaifenesin Take 100 mg by mouth every 8 (eight) hours as needed for cough.   traZODone 50 MG tablet Commonly known as:  DESYREL Take 50 mg by mouth at bedtime.   Vitamin D3 50000 units Caps Take 1 capsule by mouth once a week. On Monday       Meds ordered this encounter  Medications  . Cholecalciferol (VITAMIN D3) 50000 units CAPS    Sig: Take 1 capsule by mouth once a week. On Monday    There is no immunization history for the selected administration types on file  for this patient.  Social History  Substance Use Topics  . Smoking status: Former Smoker    Quit date: 01/18/2008  . Smokeless tobacco: Former Systems developer  . Alcohol use No    Review of Systems  DATA OBTAINED: from patient GENERAL:  no fevers, fatigue, appetite changes SKIN: No itching, rash HEENT: No complaint RESPIRATORY: No cough, wheezing, SOB CARDIAC: No chest pain, palpitations, lower extremity edema  GI: No abdominal pain, No N/V/D or constipation, No heartburn or reflux  GU: No dysuria, frequency or urgency, or incontinence  MUSCULOSKELETAL: No unrelieved bone/joint pain NEUROLOGIC: New tremor as in HPI  PSYCHIATRIC: No overt anxiety or sadness  Vitals:   08/11/15 0847  BP: 127/77  Pulse: 80  Resp: 18  Temp: 98.9 F (37.2 C)    Physical Exam  GENERAL APPEARANCE: Alert, conversant, No acute distress  SKIN: No diaphoresis rash HEENT: Unremarkable RESPIRATORY: Breathing is even, unlabored. Lung sounds are clear   CARDIOVASCULAR: Heart RRR no murmurs, rubs or gallops. No peripheral edema  GASTROINTESTINAL: Abdomen is soft, non-tender, not distended w/ normal bowel sounds.  GENITOURINARY: Bladder non tender, not distended  MUSCULOSKELETAL: No abnormal joints or musculature NEUROLOGIC: Cranial nerves 2-12 grossly intact. Moves all extremities; resting tremor that is pill rolling in nature that worsens with trying to hold a cup with increased  amplitude PSYCHIATRIC: Mood and affect appropriate to situation, no behavioral issues  Patient Active Problem List   Diagnosis Date Noted  . Tremor 08/11/2015  . Suicidal ideation 05/10/2015  . Bipolar depression ([place]) 05/10/2015  . Complete heart block ([place]) 03/20/2015  . Bradycardia 03/14/2015  . Dementia with behavioral disturbance 02/15/2015  . Staphylococcus aureus bacteremia with sepsis ([place]) 01/28/2015  . Infection of pacemaker pocket ([place]) 01/28/2015  . Acute respiratory failure ([place]) 01/26/2015  . AKI (acute kidney injury) ([place]) 01/26/2015  . Acute encephalopathy 01/26/2015  . Altered mental status   . Vitamin D deficiency 01/07/2015  . Mobitz type 2 second degree atrioventricular block 12/12/2014  . Pacemaker 12/12/2014  . Symptomatic bradycardia 12/03/2014  . Lower back pain 06/12/2012  . Right hip pain 06/12/2012  . Intertrigo 05/09/2012  . Insomnia 04/19/2012  . General weakness 03/07/2012  . 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 Diarrhea 01/17/2012  . Pyelonephritis 01/15/2012  . Diabetic peripheral neuropathy ([place]) 09/28/2011  . Urinary incontinence 09/28/2011  . Preventative health care 09/23/2011  . Gout 09/23/2011  . Bipolar affective disorder ([place]) 09/23/2011  . TIA (transient ischemic attack) 09/23/2011  . Chronic pain 09/23/2011  . DNR (do not resuscitate) 09/23/2011  . CVA (cerebral infarction) 09/23/2011  . 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 History of alcohol abuse 09/23/2011  . BPH (benign prostatic hypertrophy)   . Left ventricular systolic dysfunction   . Chronic systolic CHF (congestive heart failure) ([place]) 05/05/2011  . Edema 01/03/2011  . CAD (coronary artery disease) 05/18/2010  . Hyperlipidemia 05/18/2010  . Other abnormality of urination(788.69) 11/25/2009  . Other specified forms of chronic ischemic heart disease 05/19/2009  . CAD, ARTERY BYPASS GRAFT 11/18/2008  . AMI, INFERIOR WALL 09/30/2008  . Obstructive sleep apnea  09/16/2008  . SINUS TACHYCARDIA 09/02/2008  . DM (diabetes mellitus), type 2 with peripheral vascular complications ([place]) [id number]  . OBESITY 09/01/2008  . Hypertensive heart disease with heart failure ([place]) 09/01/2008  . TOBACCO ABUSE 07/31/2007  . 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[place]) 07/31/2007    CBC    Component Value Date/Time   WBC 7.3 05/13/2015   WBC 6.8 03/20/2015 1038  RBC 4.43 03/20/2015 1038   HGB 13.8 05/13/2015   HCT 42 05/13/2015   PLT 157 05/13/2015   MCV 91.6 03/20/2015 1038   LYMPHSABS 0.7 01/26/2015 1200   MONOABS 0.9 01/26/2015 1200   EOSABS 0.0 01/26/2015 1200   BASOSABS 0.0 01/26/2015 1200    CMP     Component Value Date/Time   NA 137 07/29/2015   K 4.5 07/29/2015   CL 102 03/20/2015 1038   CO2 28 03/20/2015 1038   GLUCOSE 171 (H) 03/20/2015 1038   BUN 28 (A) 07/29/2015   CREATININE 1.3 07/29/2015   CREATININE 1.23 03/20/2015 1038   CREATININE 1.06 12/05/2014 1536   CALCIUM 9.6 03/20/2015 1038   PROT 5.8 (L) 02/01/2015 0245   ALBUMIN 2.2 (L) 02/01/2015 0245   AST 42 (A) 07/29/2015   ALT 34 07/29/2015   ALKPHOS 35 07/29/2015   BILITOT 0.4 02/01/2015 0245   GFRNONAA 55 (L) 03/20/2015 1038   GFRAA >60 03/20/2015 1038    Assessment and Plan  Tremor Pt has a new tremor, that has 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 I am writing a neurology consult but also  For something to see if it can be improved in the meantime. Pt is already on Bblocker, don't want to use propranolol, will start primidone 0.25 mg qHS to increase to 0.5 mg qHS in 7 days.  Time spent > 25 min;> 50% of time with patient was spent reviewing records, labs, tests and studies, counseling and developing plan of care  [name], MD

## 2015-08-13 LAB — MICROALBUMIN, URINE: Microalb, Ur: 75.6

## 2015-08-14 LAB — LIPID PANEL: LDL CALC: 55 mg/dL

## 2015-09-04 ENCOUNTER — Non-Acute Institutional Stay (SKILLED_NURSING_FACILITY): Payer: Medicare Other | Admitting: Internal Medicine

## 2015-09-04 ENCOUNTER — Encounter: Payer: Self-pay | Admitting: Internal Medicine

## 2015-09-04 DIAGNOSIS — R600 Localized edema: Secondary | ICD-10-CM

## 2015-09-04 DIAGNOSIS — R609 Edema, unspecified: Secondary | ICD-10-CM | POA: Diagnosis not present

## 2015-09-04 DIAGNOSIS — E1142 Type 2 diabetes mellitus with diabetic polyneuropathy: Secondary | ICD-10-CM

## 2015-09-04 DIAGNOSIS — I5022 Chronic systolic (congestive) heart failure: Secondary | ICD-10-CM

## 2015-09-04 NOTE — Progress Notes (Signed)
MRN: [id number] Name: [name]  Sex: male Age: 75 y.o. DOB: January 24, 1940  [place] #:  Facility/Room: [facility] / 308 D Level Of Care: SNF Provider: [name], MD Emergency Contacts: Extended Emergency Contact Information Primary Emergency Contact: [name],[name] Address: [address]          [place], [place] [address] [name] of [place] Phone: [phone] Mobile Phone: [phone] Relation: Spouse Secondary Emergency Contact: [name], [name] of [place] Phone: [phone] Mobile Phone: [phone] Relation: Daughter  Code Status: DNR  Allergies: Dexedrine [dextroamphetamine sulfate er] and Oxycodone  Chief Complaint  Patient presents with  . Medical Management of Chronic Issues    Routine Visit    HPI: Patient is 75 y.o. male who   Past Medical History:  Diagnosis Date  . 2Nd degree AV block    a. s/p STJ dual chamber pacemaker 11/2014  . Benign neoplasm of colon     Adenomatous polyps  . Bipolar affective disorder ([place])    . BPH (benign prostatic hypertrophy)   . CAD (coronary artery disease)    a. s/p CABG  . Chronic pain    . CVA (cerebral infarction)     Small left occipital  . Depression    . DM        . Gout    . History of alcohol abuse    . Hyperlipidemia    . HYPERTENSION        . Ischemic cardiomyopathy    . Narcolepsy    . Obesity   . OBSTRUCTIVE SLEEP APNEA        . [facility] spotted fever   . TIA (transient ischemic attack)    . UNIVERSAL ULCERATIVE COLITIS          Past Surgical History:  Procedure Laterality Date  . CARDIAC CATHETERIZATION     Triple-vessel coronary artery disease. Low-normal left ventricular systolic function with mild   anteroapical wall motion abnormality.   [name] Kitchen CARDIAC CATHETERIZATION N/A 01/28/2015   Procedure: Temporary Pacemaker;  Surgeon: [name], MD;  Location: [facility];  Service: Cardiovascular;  Laterality: N/A;  .  CORONARY ARTERY BYPASS GRAFT     Median sternotomy, extracorporeal circulation,  coronary artery bypass graft surgery x4 using a sequential left internal   mammary artery graft to the mid and distal left anterior descending, a   saphenous vein graft to diagonal branch of the left anterior descending,   and a saphenous vein graft to the obtuse marginal branch of left   circumflex coronary artery.    . EP IMPLANTABLE DEVICE N/A 12/12/2014   Procedure: Pacemaker Implant;  Surgeon: [name], MD;  Location: [facility];  Service: Cardiovascular;  Laterality: N/A;  . EP IMPLANTABLE DEVICE N/A 01/28/2015   Procedure: PPM Generator Removal;  Surgeon: [name], MD;  Location: [facility];  Service: Cardiovascular;  Laterality: N/A;  . EP IMPLANTABLE DEVICE N/A 03/20/2015   Procedure: Pacemaker Implant;  Surgeon: [name], MD;  Location: [facility];  Service: Cardiovascular;  Laterality: N/A;  . FLEXIBLE SIGMOIDOSCOPY  01/17/2012   Procedure: FLEXIBLE SIGMOIDOSCOPY;  Surgeon: Lafayette Dragon, MD;  Location: [facility];  Service: Endoscopy;  Laterality: N/A;  . MULTIPLE EXTRACTIONS WITH ALVEOLOPLASTY  12/01/2011   Procedure: MULTIPLE EXTRACION WITH ALVEOLOPLASTY;  Surgeon: Lenn Cal, DDS;  Location: [facility];  Service: Oral Surgery;  Laterality: N/A;  Extraction of tooth #'s 3,4,5,6,7,8,9,10,11,12,13,14,15,16,17,20,21,22,23,24,25,26,27,28,29,30,31,32 with alveoloplasty and bilateral mandibular lingual tori  . TONSILECTOMY, ADENOIDECTOMY, BILATERAL MYRINGOTOMY AND TUBES  1946      Medication List       Accurate as of 09/04/15 11:59 PM. Always use your most recent med list.          aspirin 81 MG chewable tablet Chew 81 mg by mouth at bedtime.   carvedilol 3.125 MG tablet Commonly known as:  COREG Take 3.125 mg by mouth 2 (two) times daily with a meal. Hold for SBP less than 105   clonazePAM 0.5 MG tablet Commonly known as:  KLONOPIN Take 1 tablet (0.5 mg  total) by mouth See admin instructions. Take 1 tablet (0.5 mg) by mouth daily at bedtime, may also take 1 tablet (0.5 mg) 3 times daily as needed for anxiety   donepezil 10 MG tablet Commonly known as:  ARICEPT Take 10 mg by mouth at bedtime.   doxazosin 1 MG tablet Commonly known as:  CARDURA Take 1 mg by mouth at bedtime.   Fenofibrate 120 MG Tabs Take 1 tablet by mouth daily with breakfast.   Fish Oil 1000 MG Caps Take 2 capsules by mouth daily.   fluticasone 50 MCG/ACT nasal spray Commonly known as:  FLONASE Place 2 sprays into both nostrils daily. For allergies   gabapentin 100 MG capsule Commonly known as:  NEURONTIN Take 1 tablet by mouth daily  and 2 tablets at bedtime   HUMALOG KWIKPEN 100 UNIT/ML KiwkPen Generic drug:  insulin lispro Inject into the skin. Check FSBS before meals and at bedtime. SSI 201-25 4 units, 251-300 6 units, 301-350 8 units, 351-400 100 units   insulin detemir 100 UNIT/ML injection Commonly known as:  LEVEMIR Inject 30 Units into the skin daily.   lamoTRIgine 100 MG tablet Commonly known as:  LAMICTAL Take 100 mg by mouth 2 (two) times daily.   magnesium oxide 400 MG tablet Commonly known as:  MAG-OX Take 400 mg by mouth daily.   metFORMIN 500 MG tablet Commonly known as:  GLUCOPHAGE Take 500 mg by mouth 2 (two) times daily with a meal.   nitroGLYCERIN 0.4 MG SL tablet Commonly known as:  NITROSTAT Place 0.4 mg under the tongue every 5 (five) minutes as needed for chest pain.   OLANZapine 10 MG tablet Commonly known as:  ZYPREXA Give one half tablet (5 mg) by mouth at night   pravastatin 40 MG tablet Commonly known as:  PRAVACHOL Take 40 mg by mouth at bedtime.   primidone 50 MG tablet Commonly known as:  MYSOLINE Take 50 mg by mouth at bedtime.   ROBAFEN 100 MG/5ML syrup Generic drug:  guaifenesin Take 100 mg by mouth every 8 (eight) hours as needed for cough.   traZODone 50 MG tablet Commonly known as:  DESYREL Take  50 mg by mouth at bedtime.   Vitamin D3 50000 units Caps Take 1 capsule by mouth once a week. On Monday       No orders of the defined types were placed in this encounter.   There is no immunization history for the selected administration types on file for this patient.  Social History  Substance Use Topics  . Smoking status: Former Smoker    Quit date: 01/18/2008  . Smokeless tobacco: Former Systems developer  . Alcohol use No    Review of Systems  DATA OBTAINED: from patient, nurse, medical record, family member GENERAL:  no fevers, fatigue, appetite  changes SKIN: No itching, rash HEENT: No complaint RESPIRATORY: No cough, wheezing, SOB CARDIAC: No chest pain, palpitations, lower extremity edema  GI: No abdominal pain, No N/V/D or constipation, No heartburn or reflux  GU: No dysuria, frequency or urgency, or incontinence  MUSCULOSKELETAL: No unrelieved bone/joint pain NEUROLOGIC: No headache, dizziness  PSYCHIATRIC: No overt anxiety or sadness  Vitals:   09/04/15 0808  BP: 127/77  Pulse: 76  Resp: 20  Temp: 98.5 F (36.9 C)    Physical Exam  GENERAL APPEARANCE: Alert, conversant, No acute distress  SKIN: No diaphoresis rash HEENT: Unremarkable RESPIRATORY: Breathing is even, unlabored. Lung sounds are clear   CARDIOVASCULAR: Heart RRR no murmurs, rubs or gallops. No peripheral edema  GASTROINTESTINAL: Abdomen is soft, non-tender, not distended w/ normal bowel sounds.  GENITOURINARY: Bladder non tender, not distended  MUSCULOSKELETAL: No abnormal joints or musculature NEUROLOGIC: Cranial nerves 2-12 grossly intact. Moves all extremities PSYCHIATRIC: Mood and affect appropriate to situation, no behavioral issues  Patient Active Problem List   Diagnosis Date Noted  . Tremor 08/11/2015  . Suicidal ideation 05/10/2015  . Bipolar depression ([place]) 05/10/2015  . Complete heart block ([place]) 03/20/2015  . Bradycardia 03/14/2015  . Dementia with behavioral disturbance  02/15/2015  . Staphylococcus aureus bacteremia with sepsis ([place]) 01/28/2015  . Infection of pacemaker pocket ([place]) 01/28/2015  . Acute respiratory failure ([place]) 01/26/2015  . AKI (acute kidney injury) ([place]) 01/26/2015  . Acute encephalopathy 01/26/2015  . Altered mental status   . Vitamin D deficiency 01/07/2015  . Mobitz type 2 second degree atrioventricular block 12/12/2014  . Pacemaker 12/12/2014  . Symptomatic bradycardia 12/03/2014  . Lower back pain 06/12/2012  . Right hip pain 06/12/2012  . Intertrigo 05/09/2012  . Insomnia 04/19/2012  . General weakness 03/07/2012  . 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 Diarrhea 01/17/2012  . Pyelonephritis 01/15/2012  . Diabetic peripheral neuropathy ([place]) 09/28/2011  . Urinary incontinence 09/28/2011  . Preventative health care 09/23/2011  . Gout 09/23/2011  . Bipolar affective disorder ([place]) 09/23/2011  . TIA (transient ischemic attack) 09/23/2011  . Chronic pain 09/23/2011  . DNR (do not resuscitate) 09/23/2011  . CVA (cerebral infarction) 09/23/2011  . 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 History of alcohol abuse 09/23/2011  . BPH (benign prostatic hypertrophy)   . Left ventricular systolic dysfunction   . Chronic systolic CHF (congestive heart failure) ([place]) 05/05/2011  . Edema 01/03/2011  . CAD (coronary artery disease) 05/18/2010  . Hyperlipidemia 05/18/2010  . Other abnormality of urination(788.69) 11/25/2009  . Other specified forms of chronic ischemic heart disease 05/19/2009  . CAD, ARTERY BYPASS GRAFT 11/18/2008  . AMI, INFERIOR WALL 09/30/2008  . Obstructive sleep apnea 09/16/2008  . SINUS TACHYCARDIA 09/02/2008  . DM (diabetes mellitus), type 2 with peripheral vascular complications ([place]) [id number]  . OBESITY 09/01/2008  . Hypertensive heart disease with heart failure ([place]) 09/01/2008  . TOBACCO ABUSE 07/31/2007  . 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[place]) 07/31/2007    CBC    Component Value Date/Time    WBC 7.3 05/13/2015   WBC 6.8 03/20/2015 1038   RBC 4.43 03/20/2015 1038   HGB 13.8 05/13/2015   HCT 42 05/13/2015   PLT 157 05/13/2015   MCV 91.6 03/20/2015 1038   LYMPHSABS 0.7 01/26/2015 1200   MONOABS 0.9 01/26/2015 1200   EOSABS 0.0 01/26/2015 1200   BASOSABS 0.0 01/26/2015 1200    CMP     Component Value Date/Time   NA 140 08/10/2015   K 4.1 08/10/2015  CL 102 03/20/2015 1038   CO2 28 03/20/2015 1038   GLUCOSE 171 (H) 03/20/2015 1038   BUN 24 (A) 08/10/2015   CREATININE 1.0 08/10/2015   CREATININE 1.23 03/20/2015 1038   CREATININE 1.06 12/05/2014 1536   CALCIUM 9.6 03/20/2015 1038   PROT 5.8 (L) 02/01/2015 0245   ALBUMIN 2.2 (L) 02/01/2015 0245   AST 55 (A) 08/10/2015   ALT 50 (A) 08/10/2015   ALKPHOS 34 08/10/2015   BILITOT 0.4 02/01/2015 0245   GFRNONAA 55 (L) 03/20/2015 1038   GFRAA >60 03/20/2015 1038    Assessment and Plan  Diabetic peripheral neuropathy (HCC) Pt has no c/o; will cont neurontin 100 mg BID and 200 mg qHS  Chronic systolic CHF (congestive heart failure) (HCC) Chronic and stable with EF of 35%; weights are followed and they fluctuate up and down 2/2 to non compliance with diet, pt has packages of saltine crackers on his table ;most recent GFR 54, down from 19 ' pt needs to be on lasix; will start with 20 mg and monitor BMP weekly,; cont statin and fish oil  UPPER EXTREMITY EDEMA - noted today; edeam is soft, into hands equal B, plan - elevations of arms;discussed problems with salt with pt but he really doesn't care; are starting low dose lasix   Webb Silversmith D. [name], MD

## 2015-09-06 ENCOUNTER — Encounter: Payer: Self-pay | Admitting: Internal Medicine

## 2015-09-06 NOTE — Assessment & Plan Note (Addendum)
Chronic and stable with EF of 35%; weights are followed and they fluctuate up and down 2/2 to non compliance with diet, pt has packages of saltine crackers on his table ;most recent GFR 54, down from 9 ' pt needs to be on lasix; will start with 20 mg and monitor BMP weekly,; cont statin and fish oil

## 2015-09-06 NOTE — Assessment & Plan Note (Signed)
Pt has no c/o; will cont neurontin 100 mg BID and 200 mg qHS

## 2015-09-09 LAB — BASIC METABOLIC PANEL
BUN: 24 mg/dL — AB (ref 4–21)
Creatinine: 1.3 mg/dL (ref 0.6–1.3)
GLUCOSE: 396 mg/dL
Potassium: 4.2 mmol/L (ref 3.4–5.3)
SODIUM: 135 mmol/L — AB (ref 137–147)

## 2015-09-09 LAB — CBC AND DIFFERENTIAL
HEMATOCRIT: 96 % — AB (ref 41–53)
HEMOGLOBIN: 43.7 g/dL — AB (ref 13.5–17.5)
PLATELETS: 171 10*3/uL (ref 150–399)
WBC: 6.1 10^3/mL

## 2015-09-11 ENCOUNTER — Encounter: Payer: Self-pay | Admitting: Diagnostic Neuroimaging

## 2015-09-11 ENCOUNTER — Ambulatory Visit (INDEPENDENT_AMBULATORY_CARE_PROVIDER_SITE_OTHER): Payer: Medicare Other | Admitting: Diagnostic Neuroimaging

## 2015-09-11 VITALS — BP 128/76 | HR 63 | Ht 68.5 in

## 2015-09-11 DIAGNOSIS — R1310 Dysphagia, unspecified: Secondary | ICD-10-CM

## 2015-09-11 DIAGNOSIS — R269 Unspecified abnormalities of gait and mobility: Secondary | ICD-10-CM | POA: Diagnosis not present

## 2015-09-11 DIAGNOSIS — G214 Vascular parkinsonism: Secondary | ICD-10-CM | POA: Diagnosis not present

## 2015-09-11 DIAGNOSIS — G252 Other specified forms of tremor: Secondary | ICD-10-CM

## 2015-09-11 DIAGNOSIS — R471 Dysarthria and anarthria: Secondary | ICD-10-CM

## 2015-09-11 NOTE — Progress Notes (Signed)
[facility]  PATIENT: [name] DOB: April 13, 1940  REFERRING CLINICIAN: Inocencio Homes, MD HISTORY FROM: patient and wife  REASON FOR VISIT: new consult   HISTORICAL  CHIEF COMPLAINT:  Chief Complaint  Patient presents with  . Tremors    rm 6, New pt, referred for hand tremors x 1 month;  wife- [name], pt lives at [facility] and [place]:   75 year old male here for evaluation of tremor. Patient has complex medical history including coronary artery disease, ischemic cardiomyopathy, hypertension, diabetes, obstructive sleep apnea, gout, stroke, dementia, generalized functional decline.   Patient reports 6 month history of postural and action tremor, mainly affecting him when he is holding a cup or using utensils. Symptoms are worse in the past one month. Patient currently lives at [facility] rehabilitation for the past one year, and apparently carries a diagnosis of Parkinson's disease and dementia. Apparently he saw Dr. Erling Cruz 6 years ago and was diagnosed with "early Parkinson's disease".  Patient saw PCP recently started primidone for tremor control, which has not helped.    REVIEW OF SYSTEMS: Full 14 system review of systems performed and negative with exception of: Weight loss fatigue hearing loss trouble swallowing rash incontinence diarrhea increased thirst blurred vision memory loss difficulty swallowing sleepiness anxiety too much sleep.  ALLERGIES: Allergies  Allergen Reactions  . Benzedrine [Amphetamine] Nausea Only  . Dexedrine [Dextroamphetamine Sulfate Er] Other (See Comments)    agitation  . Oxycodone Nausea And Vomiting    HOME MEDICATIONS: Outpatient Medications Prior to Visit  Medication Sig Dispense Refill  . aspirin 81 MG chewable tablet Chew 81 mg by mouth at bedtime.     . carvedilol (COREG) 3.125 MG tablet Take 3.125 mg by mouth 2 (two) times daily with a meal. Hold for SBP less than 105     .  Cholecalciferol (VITAMIN D3) 50000 units CAPS Take 1 capsule by mouth once a week. On Monday    . clonazePAM (KLONOPIN) 0.5 MG tablet Take 1 tablet (0.5 mg total) by mouth See admin instructions. Take 1 tablet (0.5 mg) by mouth daily at bedtime, may also take 1 tablet (0.5 mg) 3 times daily as needed for anxiety 20 tablet 0  . donepezil (ARICEPT) 10 MG tablet Take 10 mg by mouth at bedtime.    [name] Kitchen doxazosin (CARDURA) 1 MG tablet Take 1 mg by mouth at bedtime.     . Fenofibrate 120 MG TABS Take 1 tablet by mouth daily with breakfast.    . fluticasone (FLONASE) 50 MCG/ACT nasal spray Place 2 sprays into both nostrils daily. For allergies     . guaifenesin (ROBAFEN) 100 MG/5ML syrup Take 100 mg by mouth every 8 (eight) hours as needed for cough.    . insulin detemir (LEVEMIR) 100 UNIT/ML injection Inject 30 Units into the skin daily.    . insulin lispro (HUMALOG KWIKPEN) 100 UNIT/ML KiwkPen Inject into the skin. Check FSBS before meals and at bedtime. SSI 201-25 4 units, 251-300 6 units, 301-350 8 units, 351-400 100 units    . lamoTRIgine (LAMICTAL) 100 MG tablet Take 100 mg by mouth 2 (two) times daily.     . magnesium oxide (MAG-OX) 400 MG tablet Take 400 mg by mouth daily.     . metFORMIN (GLUCOPHAGE) 500 MG tablet Take 500 mg by mouth 2 (two) times daily with a meal.     . OLANZapine (ZYPREXA) 10 MG tablet Give one half tablet (  5 mg) by mouth at night    . Omega-3 Fatty Acids (FISH OIL) 1000 MG CAPS Take 2 capsules by mouth daily.     . pravastatin (PRAVACHOL) 40 MG tablet Take 40 mg by mouth at bedtime.     . primidone (MYSOLINE) 50 MG tablet Take 50 mg by mouth at bedtime.    . traZODone (DESYREL) 50 MG tablet Take 50 mg by mouth at bedtime.    . gabapentin (NEURONTIN) 100 MG capsule Take 1 tablet by mouth daily  and 2 tablets at bedtime    . nitroGLYCERIN (NITROSTAT) 0.4 MG SL tablet Place 0.4 mg under the tongue every 5 (five) minutes as needed for chest pain.     No facility-administered  medications prior to visit.     PAST MEDICAL HISTORY: Past Medical History:  Diagnosis Date  . 2Nd degree AV block    a. s/p STJ dual chamber pacemaker 11/2014  . Benign neoplasm of colon     Adenomatous polyps  . Bipolar affective disorder ([place])    . BPH (benign prostatic hypertrophy)   . CAD (coronary artery disease)    a. s/p CABG  . Chronic pain    . CVA (cerebral infarction)     Small left occipital  . Depression    . DM        . Gout    . History of alcohol abuse    . Hyperlipidemia    . HYPERTENSION        . Ischemic cardiomyopathy    . Narcolepsy    . Obesity   . OBSTRUCTIVE SLEEP APNEA        . [facility] spotted fever   . TIA (transient ischemic attack)    . UNIVERSAL ULCERATIVE COLITIS          PAST SURGICAL HISTORY: Past Surgical History:  Procedure Laterality Date  . CARDIAC CATHETERIZATION     Triple-vessel coronary artery disease. Low-normal left ventricular systolic function with mild   anteroapical wall motion abnormality.   [name] Kitchen CARDIAC CATHETERIZATION N/A 01/28/2015   Procedure: Temporary Pacemaker;  Surgeon: [name], MD;  Location: [place] [facility];  Service: Cardiovascular;  Laterality: N/A;  . CORONARY ARTERY BYPASS GRAFT     Median sternotomy, extracorporeal circulation,  coronary artery bypass graft surgery x4 using a sequential left internal   mammary artery graft to the mid and distal left anterior descending, a   saphenous vein graft to diagonal branch of the left anterior descending,   and a saphenous vein graft to the obtuse marginal branch of left   circumflex coronary artery.    . EP IMPLANTABLE DEVICE N/A 12/12/2014   Procedure: Pacemaker Implant;  Surgeon: [name], MD;  Location: [place] [facility];  Service: Cardiovascular;  Laterality: N/A;  . EP IMPLANTABLE DEVICE N/A 01/28/2015   Procedure: PPM Generator Removal;  Surgeon: [name], MD;  Location: [place] [facility];  Service: Cardiovascular;  Laterality: N/A;    . EP IMPLANTABLE DEVICE N/A 03/20/2015   Procedure: Pacemaker Implant;  Surgeon: [name], MD;  Location: [place] [facility];  Service: Cardiovascular;  Laterality: N/A;  . FLEXIBLE SIGMOIDOSCOPY  01/17/2012   Procedure: FLEXIBLE SIGMOIDOSCOPY;  Surgeon: Lafayette Dragon, MD;  Location: [facility];  Service: Endoscopy;  Laterality: N/A;  . MULTIPLE EXTRACTIONS WITH ALVEOLOPLASTY  12/01/2011   Procedure: MULTIPLE EXTRACION WITH ALVEOLOPLASTY;  Surgeon: Lenn Cal, DDS;  Location: [place];  Service: Oral  Surgery;  Laterality: N/A;  Extraction of tooth #'s 3,4,5,6,7,8,9,10,11,12,13,14,15,16,17,20,21,22,23,24,25,26,27,28,29,30,31,32 with alveoloplasty and bilateral mandibular lingual tori  . TONSILECTOMY, ADENOIDECTOMY, BILATERAL MYRINGOTOMY AND TUBES  1946    FAMILY HISTORY: Family History  Problem Relation Age of Onset  . Diabetes Father   . Heart disease Father   . Stroke Father   . Heart attack Mother   . Aortic aneurysm Mother   . Alcohol abuse Other   . Arthritis Other   . Heart disease Other   . Mental illness Other   . Diabetes Other   . Alcohol abuse Other   . Arthritis Other   . Hyperlipidemia Other   . Heart disease Other   . Stroke Other   . Hypertension Other   . Diabetes Other   . Mental illness Other   . Heart disease Brother     SOCIAL HISTORY:  Social History   Social History  . Marital status: Married    Spouse name: [name]  . Number of children: 2  . Years of education: 12   Occupational History  . retired Retired    Ship broker   Social History Main Topics  . Smoking status: Former Smoker    Quit date: 08/18/2014  . Smokeless tobacco: Former Systems developer  . Alcohol use No     Comment: quit 2003  . Drug use: No  . Sexual activity: No   Other Topics Concern  . Not on file   Social History Narrative   Patient has been married for 51 years.   Patient presents with his wife today.    Patient has 2 grown children and lives in [place] and  [place] areas respectively.   Patient is a nonsmoker, nondrinker at this time.   09/11/15 lives at [facility] and [place] since last year   Caffeine- 1 soda daily     PHYSICAL EXAM  GENERAL EXAM/CONSTITUTIONAL: Vitals:  Vitals:   09/11/15 1107  BP: 128/76  Pulse: 63  Height: 5' 8.5" (1.74 m)     There is no height or weight on file to calculate BMI.  No exam data present  Patient is in no distress; well developed, nourished and groomed; neck is supple  SLUMPED IN WHEEL CHAIR  SLURRED SPEECH, MASKED FACIES, WET/RASPY/HOARSE VOICE  CARDIOVASCULAR:  Examination of carotid arteries is normal; no carotid bruits  Regular rate and rhythm, no murmurs  Examination of peripheral vascular system by observation and palpation is normal  EYES:  Ophthalmoscopic exam of optic discs and posterior segments is normal; no papilledema or hemorrhages  MUSCULOSKELETAL:  Gait, strength, tone, movements noted in Neurologic exam below  NEUROLOGIC: MENTAL STATUS:  No flowsheet data found.  awake, alert, oriented to person, place --> "SEPT 10, 2017"  DECR memory   normal attention and concentration  language fluent, comprehension intact, naming intact,   fund of knowledge appropriate  CRANIAL NERVE:   2nd - no papilledema on fundoscopic exam  2nd, 3rd, 4th, 6th - pupils equal and reactive to light, visual fields full to confrontation, extraocular muscles intact, no nystagmus  5th - facial sensation symmetric  7th - facial strength symmetric  8th - hearing intact  9th - palate elevates symmetrically, uvula midline  11th - shoulder shrug symmetric  12th - tongue protrusion midline  MOTOR:   normal bulk  COGWHEELING IN BUE; INCREASED TONE IN BLE  BUE 4  BLE 2-3  POSTURAL AND ACTION TREMOR IN BUE  SENSORY:   normal and symmetric to light  touch; ABSENT VIBRATION AT TOES  COORDINATION:   finger-nose-finger, fine finger movements SLOW   REFLEXES:    deep tendon reflexes TRACE and symmetric  GAIT/STATION:   UNABLE TO STAND    DIAGNOSTIC DATA (LABS, IMAGING, TESTING) - I reviewed patient records, labs, notes, testing and imaging myself where available.  Lab Results  Component Value Date   WBC 7.3 05/13/2015   HGB 13.8 05/13/2015   HCT 42 05/13/2015   MCV 91.6 03/20/2015   PLT 157 05/13/2015      Component Value Date/Time   NA 140 08/10/2015   K 4.1 08/10/2015   CL 102 03/20/2015 1038   CO2 28 03/20/2015 1038   GLUCOSE 171 (H) 03/20/2015 1038   BUN 24 (A) 08/10/2015   CREATININE 1.0 08/10/2015   CREATININE 1.23 03/20/2015 1038   CREATININE 1.06 12/05/2014 1536   CALCIUM 9.6 03/20/2015 1038   PROT 5.8 (L) 02/01/2015 0245   ALBUMIN 2.2 (L) 02/01/2015 0245   AST 55 (A) 08/10/2015   ALT 50 (A) 08/10/2015   ALKPHOS 34 08/10/2015   BILITOT 0.4 02/01/2015 0245   GFRNONAA 55 (L) 03/20/2015 1038   GFRAA >60 03/20/2015 1038   Lab Results  Component Value Date   CHOL 165 07/29/2015   HDL 35 07/29/2015   LDLCALC  08/04/2009    49        Total Cholesterol/HDL:CHD Risk Coronary Heart Disease Risk Table                     Men   Women  1/2 Average Risk   3.4   3.3  Average Risk       5.0   4.4  2 X Average Risk   9.6   7.1  3 X Average Risk  23.4   11.0        Use the calculated Patient Ratio above and the CHD Risk Table to determine the patient's CHD Risk.        ATP III CLASSIFICATION (LDL):  <100     mg/dL   Optimal  100-129  mg/dL   Near or Above                    Optimal  130-159  mg/dL   Borderline  160-189  mg/dL   High  >190     mg/dL   Very High   LDLDIRECT 85.2 06/13/2012   TRIG 623 (A) 07/29/2015   CHOLHDL 7 06/13/2012   Lab Results  Component Value Date   HGBA1C 9.8 07/29/2015   Lab Results  Component Value Date   [id number] 07/15/2008   Lab Results  Component Value Date   TSH 4.03 04/26/2015    01/26/15 CT head [I reviewed images myself and agree with interpretation. -VRP]  -  Mildly progressive chronic small vessel ischemic changes compared with prior CT from 2014. No acute intracranial findings.     ASSESSMENT AND PLAN  75 y.o. year old male here with progressive postural and action tremor, with history of Parkinson's disease diagnosis from 2011. Also with significant vascular risk factors. 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 Recommend trial of carbidopa levodopa for symptom control. Primidone has not been effective thus far at low dose and therefore may be tapered off. Will check CT scan of the head to rule out any other new problems.  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 Postural tremor   3. Action tremor   4. Gait difficulty  5. Dysphagia   6. Dysarthria      PLAN: - trial of carbidopa/levodopa (25/100); half tab TID x 2 weeks; then increase to 1 tab TID (with meals) - check CT head - may consider reducing primidone (not effective at this time)  Orders Placed This Encounter  Procedures  . CT HEAD WO CONTRAST   Return in about 3 months (around 12/12/2015).  I reviewed images, labs, notes, records myself. I summarized findings and reviewed with patient, for this high risk condition (worsening tremor and slurred speech x 1 month; possible stroke) requiring high complexity decision making.     [name], MD [id number], [id number] PM Certified in Neurology, Neurophysiology and Neuroimaging  [facility] [address], [place], [place] [address] [phone]

## 2015-09-11 NOTE — Patient Instructions (Signed)
-   trial of carbidopa/levodopa (25/100); half tab three times per day x 2 weeks; then increase to 1 tab three times per day (with meals) - check CT head - may consider reducing primidone (not effective at this time)

## 2015-09-22 ENCOUNTER — Ambulatory Visit
Admission: RE | Admit: 2015-09-22 | Discharge: 2015-09-22 | Disposition: A | Discharge status: Home / Self Care | Payer: Medicare Other | Source: Ambulatory Visit | Attending: Diagnostic Neuroimaging | Admitting: Diagnostic Neuroimaging

## 2015-09-22 DIAGNOSIS — R1310 Dysphagia, unspecified: Secondary | ICD-10-CM

## 2015-09-22 DIAGNOSIS — G214 Vascular parkinsonism: Secondary | ICD-10-CM

## 2015-09-22 DIAGNOSIS — G252 Other specified forms of tremor: Secondary | ICD-10-CM

## 2015-09-22 DIAGNOSIS — R471 Dysarthria and anarthria: Secondary | ICD-10-CM

## 2015-09-24 ENCOUNTER — Telehealth: Payer: Self-pay | Admitting: *Deleted

## 2015-09-24 LAB — CBC AND DIFFERENTIAL
HCT: 42 % (ref 41–53)
HCT: 94 % — AB (ref 41–53)
HEMOGLOBIN: 42.3 g/dL — AB (ref 13.5–17.5)
Hemoglobin: 14.3 g/dL (ref 13.5–17.5)
PLATELETS: 200 10*3/uL (ref 150–399)
Platelets: 200 10*3/uL (ref 150–399)
WBC: 12.5 10^3/mL
WBC: 4.5 10*3/mL

## 2015-09-24 LAB — BASIC METABOLIC PANEL
BUN: 23 mg/dL — AB (ref 4–21)
CREATININE: 1.3 mg/dL (ref 0.6–1.3)
Glucose: 334 mg/dL
Potassium: 4.2 mmol/L (ref 3.4–5.3)
Sodium: 135 mmol/L — AB (ref 137–147)

## 2015-09-24 NOTE — Telephone Encounter (Signed)
Per Dr Leta Baptist, spoke with patient's wife and informed her that the CT scan of his head is stable CT head. There are age related changes of atrophy and chronic small vessel ischemic disease. Advised her there are no new findings. She verbalized understanding, appreciation.

## 2015-09-25 LAB — HEPATIC FUNCTION PANEL
ALT: 14 U/L (ref 10–40)
AST: 25 U/L (ref 14–40)
Alkaline Phosphatase: 44 U/L (ref 25–125)
Bilirubin, Direct: 0.1 mg/dL (ref 0.01–0.4)
Bilirubin, Total: 0.4 mg/dL

## 2015-09-28 ENCOUNTER — Non-Acute Institutional Stay (SKILLED_NURSING_FACILITY): Payer: Medicare Other | Admitting: Internal Medicine

## 2015-09-28 ENCOUNTER — Encounter: Payer: Self-pay | Admitting: Internal Medicine

## 2015-09-28 DIAGNOSIS — G218 Other secondary parkinsonism: Secondary | ICD-10-CM | POA: Diagnosis not present

## 2015-09-28 DIAGNOSIS — J690 Pneumonitis due to inhalation of food and vomit: Secondary | ICD-10-CM | POA: Diagnosis not present

## 2015-09-28 DIAGNOSIS — F3130 Bipolar disorder, current episode depressed, mild or moderate severity, unspecified: Secondary | ICD-10-CM

## 2015-09-28 DIAGNOSIS — F319 Bipolar disorder, unspecified: Secondary | ICD-10-CM

## 2015-09-28 NOTE — Progress Notes (Addendum)
Location:  [facility] Room Number: 20 D Place of Service:  SNF ([id number])  [name]. Victor Coil, MD  Patient Care Team: Biagio Borg, MD as PCP - General (Internal Medicine)  Extended Emergency Contact Information Primary Emergency Contact: [name],[name] Address: [address]          [place], [place] [address] [name] of [place] Phone: [phone] Mobile Phone: [phone] Relation: Spouse Secondary Emergency Contact: [name], [name] of [place] Phone: [phone] Mobile Phone: [phone] Relation: Daughter   Chief Complaint  Patient presents with  . Acute Visit    Acute    HPI:  Pt is a 75 y.o. male seen today for f.u of his aspiration PNA and for routine issues of parkinsonism, new dx, and bipolar disorder.   Past Medical History:  Diagnosis Date  . 2Nd degree AV block    a. s/p STJ dual chamber pacemaker 11/2014  . Benign neoplasm of colon     Adenomatous polyps  . Bipolar affective disorder ([place])    . BPH (benign prostatic hypertrophy)   . CAD (coronary artery disease)    a. s/p CABG  . Chronic pain    . CVA (cerebral infarction)     Small left occipital  . Depression    . DM        . Gout    . History of alcohol abuse    . Hyperlipidemia    . HYPERTENSION        . Ischemic cardiomyopathy    . Narcolepsy    . Obesity   . OBSTRUCTIVE SLEEP APNEA        . [facility] spotted fever   . TIA (transient ischemic attack)    . UNIVERSAL ULCERATIVE COLITIS         Past Surgical History:  Procedure Laterality Date  . CARDIAC CATHETERIZATION     Triple-vessel coronary artery disease. Low-normal left ventricular systolic function with mild   anteroapical wall motion abnormality.   [name] Kitchen CARDIAC CATHETERIZATION N/A 01/28/2015   Procedure: Temporary Pacemaker;  Surgeon: [name], MD;  Location: [facility];  Service: Cardiovascular;  Laterality: N/A;  . CORONARY  ARTERY BYPASS GRAFT     Median sternotomy, extracorporeal circulation,  coronary artery bypass graft surgery x4 using a sequential left internal   mammary artery graft to the mid and distal left anterior descending, a   saphenous vein graft to diagonal branch of the left anterior descending,   and a saphenous vein graft to the obtuse marginal branch of left   circumflex coronary artery.    . EP IMPLANTABLE DEVICE N/A 12/12/2014   Procedure: Pacemaker Implant;  Surgeon: [name], MD;  Location: [facility];  Service: Cardiovascular;  Laterality: N/A;  . EP IMPLANTABLE DEVICE N/A 01/28/2015   Procedure: PPM Generator Removal;  Surgeon: [name], MD;  Location: [facility];  Service: Cardiovascular;  Laterality: N/A;  . EP IMPLANTABLE DEVICE N/A 03/20/2015   Procedure: Pacemaker Implant;  Surgeon: [name], MD;  Location: [facility];  Service: Cardiovascular;  Laterality: N/A;  . FLEXIBLE SIGMOIDOSCOPY  01/17/2012   Procedure: FLEXIBLE SIGMOIDOSCOPY;  Surgeon: Lafayette Dragon, MD;  Location: [facility];  Service: Endoscopy;  Laterality: N/A;  . MULTIPLE EXTRACTIONS WITH ALVEOLOPLASTY  12/01/2011   Procedure: MULTIPLE EXTRACION WITH ALVEOLOPLASTY;  Surgeon: Lenn Cal, DDS;  Location: [facility];  Service: Oral Surgery;  Laterality: N/A;  Extraction of tooth #'s 3,4,5,6,7,8,9,10,11,12,13,14,15,16,17,20,21,22,23,24,25,26,27,28,29,30,31,32 with alveoloplasty and bilateral mandibular lingual tori  . TONSILECTOMY, ADENOIDECTOMY, BILATERAL MYRINGOTOMY AND TUBES  1946    Allergies  Allergen Reactions  . Benzedrine [Amphetamine] Nausea Only  . Dexedrine [Dextroamphetamine Sulfate Er] Other (See Comments)    agitation  . Oxycodone Nausea And Vomiting      Medication List       Accurate as of 09/28/15  2:33 PM. Always use your most recent med list.          artificial tears ointment Place 1 drop into both eyes daily as needed.   aspirin 81 MG chewable  tablet Chew 81 mg by mouth at bedtime.   carbidopa-levodopa 25-100 MG tablet Commonly known as:  SINEMET IR Take 1 tablet by mouth 3 (three) times daily with meals.   carvedilol 3.125 MG tablet Commonly known as:  COREG Take 3.125 mg by mouth 2 (two) times daily with a meal. Hold for SBP less than 105   clonazePAM 0.5 MG tablet Commonly known as:  KLONOPIN Take 1 tablet (0.5 mg total) by mouth See admin instructions. Take 1 tablet (0.5 mg) by mouth daily at bedtime, may also take 1 tablet (0.5 mg) 3 times daily as needed for anxiety   donepezil 10 MG tablet Commonly known as:  ARICEPT Take 10 mg by mouth at bedtime.   doxazosin 1 MG tablet Commonly known as:  CARDURA Take 1 mg by mouth at bedtime.   Fenofibrate 120 MG Tabs Take 1 tablet by mouth daily with breakfast.   Fish Oil 1000 MG Caps Take 2 capsules by mouth daily.   fluticasone 50 MCG/ACT nasal spray Commonly known as:  FLONASE Place 2 sprays into both nostrils daily. For allergies   furosemide 20 MG tablet Commonly known as:  LASIX Take 20 mg by mouth.   gabapentin 300 MG capsule Commonly known as:  NEURONTIN 300 mg 3 (three) times daily.   HUMALOG KWIKPEN 100 UNIT/ML KiwkPen Generic drug:  insulin lispro Inject into the skin. Check FSBS before meals and at bedtime. SSI 201-25 4 units, 251-300 6 units, 301-350 8 units, 351-400 100 units   insulin detemir 100 UNIT/ML injection Commonly known as:  LEVEMIR Inject 20 Units into the skin daily.   ipratropium-albuterol 0.5-2.5 (3) MG/3ML Soln Commonly known as:  DUONEB Take 3 mLs by nebulization every 4 (four) hours as needed. Stop date 10/06/15   ketoconazole 2 % cream Commonly known as:  NIZORAL Apply 1 application topically daily. Apply to face/neck as needed   lamoTRIgine 100 MG tablet Commonly known as:  LAMICTAL Take 100 mg by mouth 2 (two) times daily.   magnesium oxide 400 MG tablet Commonly known as:  MAG-OX Take 400 mg by mouth daily.     metFORMIN 500 MG tablet Commonly known as:  GLUCOPHAGE Take 500 mg by mouth 2 (two) times daily with a meal.   nitroGLYCERIN 0.4 MG SL tablet Commonly known as:  NITROSTAT Place 0.4 mg under the tongue every 5 (five) minutes as needed for chest pain.   OLANZapine 10 MG tablet Commonly known as:  ZYPREXA Give one half tablet (5 mg) by mouth at night   pravastatin 40 MG tablet Commonly known as:  PRAVACHOL Take 40 mg by mouth at bedtime.   primidone 50 MG tablet Commonly known as:  MYSOLINE Take 50 mg by mouth at bedtime.   ROBAFEN 100 MG/5ML syrup Generic drug:  guaifenesin Take 100 mg by mouth every 8 (eight) hours as needed for cough.   traMADol 50 MG tablet Commonly known as:  ULTRAM Take by mouth every 12 (twelve) hours as needed.   traZODone 50 MG tablet Commonly known as:  DESYREL Take 50 mg by mouth at bedtime.   Vitamin D3 50000 units Caps Take 1 capsule by mouth once a week. On Monday       Review of Systems  DATA OBTAINED: from patient- " I can't get any service around here" GENERAL:  no fevers, fatigue, appetite changes SKIN: No itching, rash HEENT: No complaint RESPIRATORY: +  Cough, better, wheezing, SOB better CARDIAC: No chest pain, palpitations, lower extremity edema  GI: No abdominal pain, No N/V/D or constipation, No heartburn or reflux  GU: No dysuria, frequency or urgency, or incontinence  MUSCULOSKELETAL: No unrelieved bone/joint pain NEUROLOGIC: No headache, dizziness  PSYCHIATRIC: No overt anxiety or sadness   There is no immunization history for the selected administration types on file for this patient. Pertinent  Health Maintenance Due  Topic Date Due  . INFLUENZA VACCINE  08/18/2015  . OPHTHALMOLOGY EXAM  01/18/2016 (Originally 11/24/1950)  . PNA vac Low Risk Adult (1 of 2 - PCV13) 01/18/2016 (Originally 11/25/2003)  . HEMOGLOBIN A1C  01/29/2016  . FOOT EXAM  07/07/2016  . URINE MICROALBUMIN  08/12/2016   Fall Risk  09/11/2015  02/24/2015  Falls in the past year? No No    Vitals:   09/28/15 1349  BP: 127/77  Pulse: 89  Resp: 20  Temp: 98 F (36.7 C)  Weight: 235 lb 3.2 oz (106.7 kg)  Height: 5\' 8"  (1.727 m)   Body mass index is 35.76 kg/m.  Physical Exam  GENERAL APPEARANCE: Alert, conversant, No acute distress  SKIN: No diaphoresis rash HEENT: Unremarkable RESPIRATORY: Breathing is even, unlabored. Lung sounds are decreased at bases, no rales or wheezing; O2 sat 92% RA CARDIOVASCULAR: Heart RRR no murmurs, rubs or gallops. 1+ peripheral edema  GASTROINTESTINAL: Abdomen is soft, non-tender, not distended w/ normal bowel sounds.  GENITOURINARY: Bladder non tender, not distended  MUSCULOSKELETAL: No abnormal joints or musculature NEUROLOGIC: Cranial nerves 2-12 grossly intact. Moves all extremities PSYCHIATRIC: difficult , at times loud  Labs reviewed:  Recent Labs  02/01/15 0245 02/02/15 0539  03/20/15 1038  08/10/15 09/09/15 09/24/15  NA 140 139  < > 140  < > 140 135* 135*  K 3.3* 3.3*  < > 4.1  < > 4.1 4.2 4.2  CL 103 107  --  102  --   --   --   --   CO2 29 26  --  28  --   --   --   --   GLUCOSE 209* 188*  --  171*  --   --   --   --   BUN 13 14  < > 23*  < > 24* 24* 23*  CREATININE 0.97 0.95  < > 1.23  < > 1.0 1.3 1.3  CALCIUM 9.4 9.3  --  9.6  --   --   --   --   < > = values in this interval not displayed.  Recent Labs  01/30/15 0252 01/31/15 0305 02/01/15 0245  05/14/15 07/29/15 08/10/15  AST 24 29 27   < > 24 42* 55*  ALT 27 30 30   < > 35 34 50*  ALKPHOS 49 58 56  < > 32 35 34  BILITOT 0.5 0.7 0.4  --   --   --   --  PROT 5.0* 5.5* 5.8*  --   --   --   --   ALBUMIN 2.0* 2.3* 2.2*  --   --   --   --   < > = values in this interval not displayed.  Recent Labs  12/05/14 1536 01/26/15 1200  01/31/15 0305 02/01/15 0245  03/20/15 1038  05/13/15 09/09/15 09/24/15  WBC 6.8 12.1*  < > 5.2 6.4  < > 6.8  < > 7.3 6.1 4.5  NEUTROABS 4.0 10.5*  --   --   --   --   --   --   --    --   --   HGB 13.5 15.0  < > 13.2 13.6  < > 13.2  < > 13.8 43.7* 42.3*  HCT 40.7 44.8  < > 37.9* 39.1  < > 40.6  < > 42 96* 94*  MCV 93.1 92.9  < > 89.8 90.3  --  91.6  --   --   --   --   PLT 136* 142*  < > 128* 204  < > 175  < > 157 171 200  < > = values in this interval not displayed.  Recent Labs  04/29/15 07/29/15  CHOL 239* 165  TRIG 692* 623*   Lab Results  Component Value Date   MICROALBUR 75.6 08/13/2015   Lab Results  Component Value Date   TSH 4.03 04/26/2015   Lab Results  Component Value Date   HGBA1C 9.8 07/29/2015   Lab Results  Component Value Date   CHOL 165 07/29/2015   HDL 35 07/29/2015   LDLCALC  08/04/2009    49        Total Cholesterol/HDL:CHD Risk Coronary Heart Disease Risk Table                     Men   Women  1/2 Average Risk   3.4   3.3  Average Risk       5.0   4.4  2 X Average Risk   9.6   7.1  3 X Average Risk  23.4   11.0        Use the calculated Patient Ratio above and the CHD Risk Table to determine the patient's CHD Risk.        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 EXAM: CT head (without) TECHNIQUE: CT scan of the head was obtained utilizing 5 mm axial slices from the skull base to the vertex. CONTRAST: no IMAGING SITE: [facility] [address]  FINDINGS: The cortical sulci, fissures and cisterns are notable for mild generalized and moderate perisylvian  atrophy. Lateral, third and fourth ventricle are mildly enlarged on ex vacuo basis. Moderate  periventricular and subcortical chronic small vessel ischemic disease. Chronic left occipital ischemic infarction. No extra-axial fluid collections are seen.  No intracranial hemorrhage.  No evidence of mass effect or midline shift.  The orbits and their contents, paranasal sinuses and calvarium are unremarkable.    Abnormal CT head (without) demonstrating: 1. Moderate periventricular and subcortical chronic small vessel ischemic disease. 2. Chronic left occipital ischemic infarction. 3. Mild generalized and moderate perisylvian atrophy. 4. No acute findings. 5. No significant change from CT on 01/26/15. INTERPRETING PHYSICIAN: [name], MD Certified in Neurology, Neurophysiology and Neuroimaging [facility] [address], [place], [place] [address] [phone]    Assessment/Plan  ASPIRATION PNA - L side infiltrate on CXR, occurred after pt vomited; has received 5 days of rocephin and 2 days augmentin per [facility] provider with whom pt was discussed; pt is improved and off O2  PARKINSONISM - sent to neuro for a a tremor and dx was made; pt was on primidone which was not helping and placed on trial of sinement 25/100, , 1/2 tab TId for 2 weeks , then 1 tab TID ; tremor is much better  BIPOLAR DX- WITHOUT SUIDICAL IDEATIONS OR PSYCHOSES AT THIS TIME;WILL CONT  lamictal 100 mg BID, zyprexa 5 mg qHS, trazodone 50 mg qHS and klonopin 0.5 mf TID    [name]. Victor Coil, MD

## 2015-09-30 DIAGNOSIS — G20 Parkinson's disease: Secondary | ICD-10-CM | POA: Insufficient documentation

## 2015-09-30 DIAGNOSIS — J690 Pneumonitis due to inhalation of food and vomit: Secondary | ICD-10-CM | POA: Insufficient documentation

## 2015-09-30 DIAGNOSIS — G20C Parkinsonism, unspecified: Secondary | ICD-10-CM

## 2015-09-30 HISTORY — DX: Parkinsonism, unspecified: G20.C

## 2015-09-30 HISTORY — DX: Parkinson's disease: G20

## 2015-10-26 ENCOUNTER — Non-Acute Institutional Stay (SKILLED_NURSING_FACILITY): Payer: Medicare Other | Admitting: Internal Medicine

## 2015-10-26 ENCOUNTER — Encounter: Payer: Self-pay | Admitting: Internal Medicine

## 2015-10-26 DIAGNOSIS — I442 Atrioventricular block, complete: Secondary | ICD-10-CM | POA: Diagnosis not present

## 2015-10-26 DIAGNOSIS — I25708 Atherosclerosis of coronary artery bypass graft(s), unspecified, with other forms of angina pectoris: Secondary | ICD-10-CM | POA: Diagnosis not present

## 2015-10-26 DIAGNOSIS — I110 Hypertensive heart disease with heart failure: Secondary | ICD-10-CM | POA: Diagnosis not present

## 2015-10-26 NOTE — Progress Notes (Signed)
Patient ID: [name], male   DOB: [birth year], 75 y.o.   MRN: [id number]  Location:  [facility] Room Number: [place] of Service:  SNF [phone])  Cathlean Cower, MD  Patient Care Team: Biagio Borg, MD as PCP - General (Internal Medicine)  Extended Emergency Contact Information Primary Emergency Contact: [name],[name] Address: [address]          [place], [place] [address] [name] of [place] Phone: [phone] Mobile Phone: [phone] Relation: Spouse Secondary Emergency Contact: [name], [name] of [place] Phone: [phone] Mobile Phone: [phone] Relation: Daughter    Allergies: Benzedrine [amphetamine]; Dexedrine [dextroamphetamine sulfate er]; and Oxycodone  Chief Complaint  Patient presents with  . Medical Management of Chronic Issues    ROUTINE VISIT    HPI: Patient is 75 y.o. male who is being seen for routine issues of HTN, complete heart block and CAD.  Past Medical History:  Diagnosis Date  . 2nd degree AV block    a. s/p STJ dual chamber pacemaker 11/2014  . Benign neoplasm of colon     Adenomatous polyps  . Bipolar affective disorder ([place])    . BPH (benign prostatic hypertrophy)   . CAD (coronary artery disease)    a. s/p CABG  . Chronic pain    . CVA (cerebral infarction)     Small left occipital  . Depression    . DM        . Gout    . History of alcohol abuse    . Hyperlipidemia    . HYPERTENSION        . Ischemic cardiomyopathy    . Narcolepsy    . Obesity   . OBSTRUCTIVE SLEEP APNEA        . [facility] spotted fever   . TIA (transient ischemic attack)    . UNIVERSAL ULCERATIVE COLITIS          Past Surgical History:  Procedure Laterality Date  . CARDIAC CATHETERIZATION     Triple-vessel coronary artery disease. Low-normal left ventricular systolic function with mild   anteroapical wall motion abnormality.   [name] Kitchen CARDIAC CATHETERIZATION N/A  01/28/2015   Procedure: Temporary Pacemaker;  Surgeon: [name], MD;  Location: [facility];  Service: Cardiovascular;  Laterality: N/A;  . CORONARY ARTERY BYPASS GRAFT     Median sternotomy, extracorporeal circulation,  coronary artery bypass graft surgery x4 using a sequential left internal   mammary artery graft to the mid and distal left anterior descending, a   saphenous vein graft to diagonal branch of the left anterior descending,   and a saphenous vein graft to the obtuse marginal branch of left   circumflex coronary artery.    . EP IMPLANTABLE DEVICE N/A 12/12/2014   Procedure: Pacemaker Implant;  Surgeon: [name], MD;  Location: [facility];  Service: Cardiovascular;  Laterality: N/A;  . EP IMPLANTABLE DEVICE N/A 01/28/2015   Procedure: PPM Generator Removal;  Surgeon: [name], MD;  Location: [facility];  Service: Cardiovascular;  Laterality: N/A;  . EP IMPLANTABLE DEVICE N/A 03/20/2015   Procedure: Pacemaker Implant;  Surgeon: [name], MD;  Location: [facility];  Service: Cardiovascular;  Laterality: N/A;  . FLEXIBLE SIGMOIDOSCOPY  01/17/2012   Procedure: FLEXIBLE SIGMOIDOSCOPY;  Surgeon: Lafayette Dragon, MD;  Location: [facility];  Service: Endoscopy;  Laterality: N/A;  . MULTIPLE EXTRACTIONS WITH ALVEOLOPLASTY  12/01/2011   Procedure: MULTIPLE EXTRACION WITH ALVEOLOPLASTY;  Surgeon: Lenn Cal, DDS;  Location: [facility];  Service: Oral Surgery;  Laterality: N/A;  Extraction of tooth #'s 3,4,5,6,7,8,9,10,11,12,13,14,15,16,17,20,21,22,23,24,25,26,27,28,29,30,31,32 with alveoloplasty and bilateral mandibular lingual tori  . TONSILECTOMY, ADENOIDECTOMY, BILATERAL MYRINGOTOMY AND TUBES  1946      Medication List       Accurate as of 10/26/15 11:59 PM. Always use your most recent med list.          artificial tears ointment Place 1 drop into both eyes daily as needed.   aspirin 81 MG chewable tablet Chew 81 mg by mouth at  bedtime.   carbidopa-levodopa 25-100 MG tablet Commonly known as:  SINEMET IR Take 1 tablet by mouth 3 (three) times daily with meals.   carvedilol 3.125 MG tablet Commonly known as:  COREG Take 3.125 mg by mouth 2 (two) times daily with a meal. Hold for SBP less than 105   clonazePAM 0.5 MG tablet Commonly known as:  KLONOPIN Take 1 tablet (0.5 mg total) by mouth See admin instructions. Take 1 tablet (0.5 mg) by mouth daily at bedtime, may also take 1 tablet (0.5 mg) 3 times daily as needed for anxiety   donepezil 10 MG tablet Commonly known as:  ARICEPT Take 10 mg by mouth at bedtime.   doxazosin 1 MG tablet Commonly known as:  CARDURA Take 1 mg by mouth at bedtime.   Fenofibrate 120 MG Tabs Take 1 tablet by mouth daily with breakfast.   Fish Oil 1000 MG Caps Take 2 capsules by mouth daily.   fluticasone 50 MCG/ACT nasal spray Commonly known as:  FLONASE Place 2 sprays into both nostrils daily. For allergies   furosemide 20 MG tablet Commonly known as:  LASIX Take 20 mg by mouth.   gabapentin 300 MG capsule Commonly known as:  NEURONTIN 300 mg 3 (three) times daily.   HUMALOG KWIKPEN 100 UNIT/ML KiwkPen Generic drug:  insulin lispro Inject into the skin. Check FSBS before meals and at bedtime. SSI 201-25 4 units, 251-300 6 units, 301-350 8 units, 351-400 100 units   insulin detemir 100 UNIT/ML injection Commonly known as:  LEVEMIR Inject 20 Units into the skin daily.   ketoconazole 2 % cream Commonly known as:  NIZORAL Apply 1 application topically daily. Apply to face/neck as needed   lamoTRIgine 100 MG tablet Commonly known as:  LAMICTAL Take 100 mg by mouth 2 (two) times daily.   magnesium oxide 400 MG tablet Commonly known as:  MAG-OX Take 400 mg by mouth daily.   metFORMIN 500 MG tablet Commonly known as:  GLUCOPHAGE Take 500 mg by mouth 2 (two) times daily with a meal.   nitroGLYCERIN 0.4 MG SL tablet Commonly known as:  NITROSTAT Place 0.4 mg  under the tongue every 5 (five) minutes as needed for chest pain.   OLANZapine 10 MG tablet Commonly known as:  ZYPREXA Give one half tablet (5 mg) by mouth at night   pravastatin 40 MG tablet Commonly known as:  PRAVACHOL Take 40 mg by mouth at bedtime.   primidone 50 MG tablet Commonly known as:  MYSOLINE Take 50 mg by mouth at bedtime.   ROBAFEN 100 MG/5ML syrup Generic drug:  guaifenesin Take 100 mg by mouth every 8 (eight) hours as needed for cough.   traMADol 50 MG tablet Commonly known as:  ULTRAM Take by mouth every 12 (twelve) hours as needed.  traZODone 50 MG tablet Commonly known as:  DESYREL Take 50 mg by mouth at bedtime.   Vitamin D3 50000 units Caps Take 1 capsule by mouth once a week. On Monday       No orders of the defined types were placed in this encounter.   Immunization History  Administered Date(s) Administered  . Influenza-Unspecified 10/09/2015  . Pneumococcal Conjugate-13 10/09/2015    Social History  Substance Use Topics  . Smoking status: Former Smoker    Quit date: 08/18/2014  . Smokeless tobacco: Former Systems developer  . Alcohol use No     Comment: quit 2003    Review of Systems  DATA OBTAINED: from patient, nurse GENERAL:  no fevers, fatigue, appetite changes; diet non compliance SKIN: No itching, rash HEENT: No complaint RESPIRATORY: No cough, wheezing, SOB CARDIAC: No chest pain, palpitations, lower extremity edema  GI: No abdominal pain, No N/V/D or constipation, No heartburn or reflux  GU: No dysuria, frequency or urgency, or incontinence  MUSCULOSKELETAL: No unrelieved bone/joint pain NEUROLOGIC: No headache, dizziness  PSYCHIATRIC: No overt anxiety or sadness  Vitals:   10/26/15 1517  BP: 114/74  Pulse: 71  Resp: 18  Temp: 97.6 F (36.4 C)   Body mass index is 35.73 kg/m. Physical Exam  GENERAL APPEARANCE: Alert, conversant, No acute distress , obese SKIN: No diaphoresis rash HEENT: Unremarkable RESPIRATORY:  Breathing is even, unlabored. Lung sounds are clear   CARDIOVASCULAR: Heart RRR no murmurs, rubs or gallops. No peripheral edema  GASTROINTESTINAL: Abdomen is soft, non-tender, not distended w/ normal bowel sounds.  GENITOURINARY: Bladder non tender, not distended  MUSCULOSKELETAL: No abnormal joints or musculature NEUROLOGIC: Cranial nerves 2-12 grossly intact. Moves all extremities PSYCHIATRIC: Mood and affect appropriate to situation, pt is always in a "bad mood", no behavioral issues  Patient Active Problem List   Diagnosis Date Noted  . Aspiration pneumonia ([place]) 09/30/2015  . Parkinsonism ([place]) 09/30/2015  . Tremor 08/11/2015  . Suicidal ideation 05/10/2015  . Bipolar depression ([place]) 05/10/2015  . Complete heart block ([place]) 03/20/2015  . Bradycardia 03/14/2015  . Dementia with behavioral disturbance 02/15/2015  . Staphylococcus aureus bacteremia with sepsis ([place]) 01/28/2015  . Infection of pacemaker pocket ([place]) 01/28/2015  . Acute respiratory failure ([place]) 01/26/2015  . AKI (acute kidney injury) ([place]) 01/26/2015  . Acute encephalopathy 01/26/2015  . Altered mental status   . Vitamin D deficiency 01/07/2015  . Mobitz type 2 second degree atrioventricular block 12/12/2014  . Pacemaker 12/12/2014  . Symptomatic bradycardia 12/03/2014  . Lower back pain 06/12/2012  . Right hip pain 06/12/2012  . Intertrigo 05/09/2012  . Insomnia 04/19/2012  . General weakness 03/07/2012  . 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 Diarrhea 01/17/2012  . Pyelonephritis 01/15/2012  . Diabetic peripheral neuropathy ([place]) 09/28/2011  . Urinary incontinence 09/28/2011  . Preventative health care 09/23/2011  . Gout 09/23/2011  . Bipolar affective disorder ([place]) 09/23/2011  . TIA (transient ischemic attack) 09/23/2011  . Chronic pain 09/23/2011  . DNR (do not resuscitate) 09/23/2011  . CVA (cerebral infarction) 09/23/2011  . 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 History of alcohol abuse  09/23/2011  . BPH (benign prostatic hypertrophy)   . Left ventricular systolic dysfunction   . Chronic systolic CHF (congestive heart failure) ([place]) 05/05/2011  . Edema 01/03/2011  . CAD (coronary artery disease) 05/18/2010  . Hyperlipidemia 05/18/2010  . Other abnormality of urination(788.69) 11/25/2009  . Other specified forms of chronic ischemic heart disease 05/19/2009  . CAD, ARTERY BYPASS GRAFT  11/18/2008  . AMI, INFERIOR WALL 09/30/2008  . Obstructive sleep apnea 09/16/2008  . SINUS TACHYCARDIA 09/02/2008  . DM (diabetes mellitus), type 2 with peripheral vascular complications ([place]) [id number]  . OBESITY 09/01/2008  . Hypertensive heart disease with heart failure ([place]) 09/01/2008  . TOBACCO ABUSE 07/31/2007  . 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    CMP     Component Value Date/Time   NA 135 (A) 09/24/2015   K 4.2 09/24/2015   CL 102 03/20/2015 1038   CO2 28 03/20/2015 1038   GLUCOSE 171 (H) 03/20/2015 1038   BUN 23 (A) 09/24/2015   CREATININE 1.3 09/24/2015   CREATININE 1.23 03/20/2015 1038   CREATININE 1.06 12/05/2014 1536   CALCIUM 9.6 03/20/2015 1038   PROT 5.8 (L) 02/01/2015 0245   ALBUMIN 2.2 (L) 02/01/2015 0245   AST 55 (A) 08/10/2015   ALT 50 (A) 08/10/2015   ALKPHOS 34 08/10/2015   BILITOT 0.4 02/01/2015 0245   GFRNONAA 55 (L) 03/20/2015 1038   GFRAA >60 03/20/2015 1038    Recent Labs  02/01/15 0245 02/02/15 0539  03/20/15 1038  08/10/15 09/09/15 09/24/15  NA 140 139  < > 140  < > 140 135* 135*  K 3.3* 3.3*  < > 4.1  < > 4.1 4.2 4.2  CL 103 107  --  102  --   --   --   --   CO2 29 26  --  28  --   --   --   --   GLUCOSE 209* 188*  --  171*  --   --   --   --   BUN 13 14  < > 23*  < > 24* 24* 23*  CREATININE 0.97 0.95  < > 1.23  < > 1.0 1.3 1.3  CALCIUM 9.4 9.3  --  9.6  --   --   --   --   < > = values in this interval not displayed.  Recent Labs  01/30/15 0252 01/31/15 0305 02/01/15 0245  05/14/15 07/29/15 08/10/15  AST 24 29 27    < > 24 42* 55*  ALT 27 30 30   < > 35 34 50*  ALKPHOS 49 58 56  < > 32 35 34  BILITOT 0.5 0.7 0.4  --   --   --   --   PROT 5.0* 5.5* 5.8*  --   --   --   --   ALBUMIN 2.0* 2.3* 2.2*  --   --   --   --   < > = values in this interval not displayed.  Recent Labs  12/05/14 1536 01/26/15 1200  01/31/15 0305 02/01/15 0245  03/20/15 1038  09/09/15 09/24/15 09/24/15 0840  WBC 6.8 12.1*  < > 5.2 6.4  < > 6.8  < > 6.1 4.5 12.5  NEUTROABS 4.0 10.5*  --   --   --   --   --   --   --   --   --   HGB 13.5 15.0  < > 13.2 13.6  < > 13.2  < > 43.7* 42.3* 14.3  HCT 40.7 44.8  < > 37.9* 39.1  < > 40.6  < > 96* 94* 42  MCV 93.1 92.9  < > 89.8 90.3  --  91.6  --   --   --   --   PLT 136* 142*  < > 128* 204  < > 175  < > 171  200 200  < > = values in this interval not displayed.  Recent Labs  04/29/15 07/29/15  CHOL 239* 165  TRIG 692* 623*   Lab Results  Component Value Date   MICROALBUR 75.6 08/13/2015   Lab Results  Component Value Date   TSH 4.03 04/26/2015   Lab Results  Component Value Date   HGBA1C 9.8 07/29/2015   Lab Results  Component Value Date   CHOL 165 07/29/2015   HDL 35 07/29/2015   LDLCALC  08/04/2009    49        Total Cholesterol/HDL:CHD Risk Coronary Heart Disease Risk Table                     Men   Women  1/2 Average Risk   3.4   3.3  Average Risk       5.0   4.4  2 X Average Risk   9.6   7.1  3 X Average Risk  23.4   11.0        Use the calculated Patient Ratio above and the CHD Risk Table to determine the patient's CHD Risk.        ATP III CLASSIFICATION (LDL):  <100     mg/dL   Optimal  100-129  mg/dL   Near or Above                    Optimal  130-159  mg/dL   Borderline  160-189  mg/dL   High  >190     mg/dL   Very High   LDLDIRECT 85.2 06/13/2012   TRIG 623 (A) 07/29/2015   CHOLHDL 7 06/13/2012    Significant Diagnostic Results in last 30 days:  No results found.  Assessment and Plan  Hypertensive heart disease with heart failure  (HCC) Controlled;plan to cont cardura 1 mg qHS, lasix 20 mg daily and coreg 3.125 mg BID  CAD (coronary artery disease) No reported episodes of CP or equivalent;plan to cont coreg, statin ,ASA, fenofibrate, fish oil  Complete heart block ([place]) Pt has been stable since implantation of permanent pacemaker in 03/2015; no signs or sx of rhythm problems; will cont to monitor     Inocencio Homes, MD

## 2015-11-08 ENCOUNTER — Encounter: Payer: Self-pay | Admitting: Internal Medicine

## 2015-11-08 NOTE — Assessment & Plan Note (Addendum)
No reported episodes of CP or equivalent;plan to cont coreg, statin ,ASA, fenofibrate, fish oil

## 2015-11-08 NOTE — Assessment & Plan Note (Signed)
Pt has been stable since implantation of permanent pacemaker in 03/2015; no signs or sx of rhythm problems; will cont to monitor

## 2015-11-08 NOTE — Assessment & Plan Note (Signed)
Controlled;plan to cont cardura 1 mg qHS, lasix 20 mg daily and coreg 3.125 mg BID

## 2015-11-12 LAB — HEPATIC FUNCTION PANEL
ALK PHOS: 29 U/L (ref 25–125)
ALT: 14 U/L (ref 10–40)
AST: 30 U/L (ref 14–40)
Bilirubin, Total: 0.3 mg/dL

## 2015-11-12 LAB — LIPID PANEL
CHOLESTEROL: 172 mg/dL (ref 0–200)
HDL: 33 mg/dL — AB (ref 35–70)
Triglycerides: 611 mg/dL — AB (ref 40–160)

## 2015-11-12 LAB — CBC AND DIFFERENTIAL
HEMATOCRIT: 44 % (ref 41–53)
Hemoglobin: 14.3 g/dL (ref 13.5–17.5)
PLATELETS: 203 10*3/uL (ref 150–399)
WBC: 6 10*3/mL

## 2015-11-12 LAB — BASIC METABOLIC PANEL
BUN: 31 mg/dL — AB (ref 4–21)
CREATININE: 1.2 mg/dL (ref 0.6–1.3)
Glucose: 337 mg/dL
Potassium: 4.3 mmol/L (ref 3.4–5.3)
Sodium: 137 mmol/L (ref 137–147)

## 2015-11-12 LAB — TSH: TSH: 0.92 u[IU]/mL (ref 0.41–5.90)

## 2015-11-12 LAB — HEMOGLOBIN A1C: Hemoglobin A1C: 11

## 2015-11-12 LAB — PSA: PSA: 1.3

## 2015-11-25 ENCOUNTER — Non-Acute Institutional Stay (SKILLED_NURSING_FACILITY): Payer: Medicare Other | Admitting: Internal Medicine

## 2015-11-25 ENCOUNTER — Encounter: Payer: Self-pay | Admitting: Internal Medicine

## 2015-11-25 DIAGNOSIS — F0281 Dementia in other diseases classified elsewhere with behavioral disturbance: Secondary | ICD-10-CM

## 2015-11-25 DIAGNOSIS — F02818 Dementia in other diseases classified elsewhere, unspecified severity, with other behavioral disturbance: Secondary | ICD-10-CM

## 2015-11-25 DIAGNOSIS — E1151 Type 2 diabetes mellitus with diabetic peripheral angiopathy without gangrene: Secondary | ICD-10-CM

## 2015-11-25 DIAGNOSIS — N400 Enlarged prostate without lower urinary tract symptoms: Secondary | ICD-10-CM

## 2015-11-25 DIAGNOSIS — G300 Alzheimer's disease with early onset: Secondary | ICD-10-CM | POA: Diagnosis not present

## 2015-11-25 NOTE — Progress Notes (Signed)
Location:  [facility] Room Number: [phone] Place of Service:  SNF [phone])  [name]. Victor Coil, MD  Patient Care Team: Biagio Borg, MD as PCP - General (Internal Medicine)  Extended Emergency Contact Information Primary Emergency Contact: [name],[name] Address: [address]          [place], [place] [address] [name] of [place] Phone: [phone] Mobile Phone: [phone] Relation: Spouse Secondary Emergency Contact: [name], [name] of [place] Phone: [phone] Mobile Phone: [phone] Relation: Daughter    Allergies: Benzedrine [amphetamine]; Dexedrine [dextroamphetamine sulfate er]; and Oxycodone  Chief Complaint  Patient presents with  . Medical Management of Chronic Issues    Routine Visit    HPI: Patient is 75 y.o. male who is being seen for routine issues of DM2, dementia and BPH.  Past Medical History:  Diagnosis Date  . 2nd degree AV block    a. s/p STJ dual chamber pacemaker 11/2014  . Benign neoplasm of colon     Adenomatous polyps  . Bipolar affective disorder ([place])    . BPH (benign prostatic hypertrophy)   . CAD (coronary artery disease)    a. s/p CABG  . Chronic pain    . CVA (cerebral infarction)     Small left occipital  . Depression    . DM        . Gout    . History of alcohol abuse    . Hyperlipidemia    . HYPERTENSION        . Ischemic cardiomyopathy    . Narcolepsy    . Obesity   . OBSTRUCTIVE SLEEP APNEA        . [facility] spotted fever   . TIA (transient ischemic attack)    . UNIVERSAL ULCERATIVE COLITIS          Past Surgical History:  Procedure Laterality Date  . CARDIAC CATHETERIZATION     Triple-vessel coronary artery disease. Low-normal left ventricular systolic function with mild   anteroapical wall motion abnormality.   [name] Kitchen CARDIAC CATHETERIZATION N/A 01/28/2015   Procedure: Temporary Pacemaker;  Surgeon: [name], MD;  Location:  [facility];  Service: Cardiovascular;  Laterality: N/A;  . CORONARY ARTERY BYPASS GRAFT     Median sternotomy, extracorporeal circulation,  coronary artery bypass graft surgery x4 using a sequential left internal   mammary artery graft to the mid and distal left anterior descending, a   saphenous vein graft to diagonal branch of the left anterior descending,   and a saphenous vein graft to the obtuse marginal branch of left   circumflex coronary artery.    . EP IMPLANTABLE DEVICE N/A 12/12/2014   Procedure: Pacemaker Implant;  Surgeon: [name], MD;  Location: [facility];  Service: Cardiovascular;  Laterality: N/A;  . EP IMPLANTABLE DEVICE N/A 01/28/2015   Procedure: PPM Generator Removal;  Surgeon: [name], MD;  Location: [facility];  Service: Cardiovascular;  Laterality: N/A;  . EP IMPLANTABLE DEVICE N/A 03/20/2015   Procedure: Pacemaker Implant;  Surgeon: [name], MD;  Location: [facility];  Service: Cardiovascular;  Laterality: N/A;  . FLEXIBLE SIGMOIDOSCOPY  01/17/2012   Procedure: FLEXIBLE SIGMOIDOSCOPY;  Surgeon: Lafayette Dragon, MD;  Location: [facility];  Service: Endoscopy;  Laterality: N/A;  . MULTIPLE EXTRACTIONS WITH ALVEOLOPLASTY  12/01/2011   Procedure: MULTIPLE EXTRACION WITH ALVEOLOPLASTY;  Surgeon: Lenn Cal, DDS;  Location: [facility];  Service: Oral Surgery;  Laterality: N/A;  Extraction of tooth #'s 3,4,5,6,7,8,9,10,11,12,13,14,15,16,17,20,21,22,23,24,25,26,27,28,29,30,31,32 with alveoloplasty and bilateral mandibular lingual tori  . TONSILECTOMY, ADENOIDECTOMY, BILATERAL MYRINGOTOMY AND TUBES  1948      Medication List       Accurate as of 11/25/15 11:59 PM. Always use your most recent med list.          acetaminophen 650 MG suppository Commonly known as:  TYLENOL Place 650 mg rectally every 6 (six) hours as needed for fever. For fever above 100F   artificial tears ointment Place 1 drop into both eyes daily as  needed.   aspirin 81 MG chewable tablet Chew 81 mg by mouth at bedtime.   BIOFREEZE 4 % Gel Generic drug:  Menthol (Topical Analgesic) Apply to right elbow twice daily as needed for pain.   carbidopa-levodopa 25-100 MG tablet Commonly known as:  SINEMET IR Take 1 tablet by mouth 3 (three) times daily with meals.   carvedilol 3.125 MG tablet Commonly known as:  COREG Take 3.125 mg by mouth 2 (two) times daily with a meal. Hold for SBP less than 105   clonazePAM 0.5 MG tablet Commonly known as:  KLONOPIN Take 1 tablet (0.5 mg total) by mouth See admin instructions. Take 1 tablet (0.5 mg) by mouth daily at bedtime, may also take 1 tablet (0.5 mg) 3 times daily as needed for anxiety   docusate sodium 100 MG capsule Commonly known as:  COLACE Take 100 mg by mouth daily. Hold for > 2 loose stools in a day   donepezil 10 MG tablet Commonly known as:  ARICEPT Take 10 mg by mouth at bedtime.   doxazosin 1 MG tablet Commonly known as:  CARDURA Take 1 mg by mouth at bedtime.   feeding supplement (PRO-STAT SUGAR FREE 64) Liqd Take 30 mLs by mouth 2 (two) times daily.   fenofibrate 145 MG tablet Commonly known as:  TRICOR Take 145 mg by mouth daily. With a meal   Fish Oil 1000 MG Caps Take 2 capsules by mouth daily.   furosemide 20 MG tablet Commonly known as:  LASIX Take 20 mg by mouth.   gabapentin 300 MG capsule Commonly known as:  NEURONTIN 300 mg 3 (three) times daily.   HUMALOG KWIKPEN 100 UNIT/ML KiwkPen Generic drug:  insulin lispro Inject 5 Units into the skin 3 (three) times daily. Hold for CBG  < 200. ( to be given in addition to sliding scale) Check FSBS before meals and at bedtime. SSI 201-25 4 units, 251-300 6 units, 301-350 8 units, 351-400 10 units   insulin detemir 100 UNIT/ML injection Commonly known as:  LEVEMIR Inject 40 Units into the skin daily.   ketoconazole 2 % cream Commonly known as:  NIZORAL Apply 1 application topically daily. Apply to  face/neck as needed   lamoTRIgine 100 MG tablet Commonly known as:  LAMICTAL Take 100 mg by mouth 2 (two) times daily.   magnesium oxide 400 MG tablet Commonly known as:  MAG-OX Take 400 mg by mouth daily.   metFORMIN 500 MG tablet Commonly known as:  GLUCOPHAGE Take 500 mg by mouth 2 (two) times daily with a meal.   nitroGLYCERIN 0.4 MG SL tablet Commonly known as:  NITROSTAT Place 0.4 mg under the tongue every 5 (five) minutes as needed for chest pain.   OLANZapine 10 MG tablet Commonly known as:  ZYPREXA Give one half tablet (5 mg) by mouth  at night   OXYGEN Inhale 2 L into the lungs continuous. To keep spo2 above 90%   polyethylene glycol packet Commonly known as:  MIRALAX / GLYCOLAX Take 17 g by mouth daily.   pravastatin 40 MG tablet Commonly known as:  PRAVACHOL Take 40 mg by mouth at bedtime.   ROBAFEN 100 MG/5ML syrup Generic drug:  guaifenesin Take 100 mg by mouth every 8 (eight) hours as needed for cough.   traMADol 50 MG tablet Commonly known as:  ULTRAM Take by mouth 2 (two) times daily as needed.   traZODone 50 MG tablet Commonly known as:  DESYREL Take 50 mg by mouth at bedtime.   Vitamin D3 50000 units Caps Take 1 capsule by mouth once a week. On Monday       No orders of the defined types were placed in this encounter.   There is no immunization history for the selected administration types on file for this patient.  Social History  Substance Use Topics  . Smoking status: Former Smoker    Quit date: 08/18/2014  . Smokeless tobacco: Former Systems developer  . Alcohol use No     Comment: quit 2003    Review of Systems  DATA OBTAINED: from patient, nurse GENERAL:  no fevers, fatigue, appetite changes SKIN: No itching, rash HEENT: No complaint RESPIRATORY: No cough, wheezing, SOB CARDIAC: No chest pain, palpitations, lower extremity edema  GI: No abdominal pain, No N/V/D or constipation, No heartburn or reflux  GU: No dysuria, frequency or  urgency, or incontinence  MUSCULOSKELETAL: No unrelieved bone/joint pain NEUROLOGIC: No headache, dizziness  PSYCHIATRIC: No overt anxiety or sadness  Vitals:   11/25/15 1349  BP: 114/74  Pulse: 69  Resp: 18  Temp: 98.1 F (36.7 C)   Body mass index is 34.18 kg/m. Physical Exam  GENERAL APPEARANCE: Alert, conversant, No acute distress  SKIN: No diaphoresis rash HEENT: Unremarkable RESPIRATORY: Breathing is even, unlabored. Lung sounds are clear   CARDIOVASCULAR: Heart RRR no murmurs, rubs or gallops. No peripheral edema  GASTROINTESTINAL: Abdomen is soft, non-tender, not distended w/ normal bowel sounds.  GENITOURINARY: Bladder non tender, not distended  MUSCULOSKELETAL: No abnormal joints or musculature NEUROLOGIC: Cranial nerves 2-12 grossly intact. Moves all extremities PSYCHIATRIC: Mood and affect appropriate to situation with dementia, no behavioral issues  Patient Active Problem List   Diagnosis Date Noted  . Aspiration pneumonia ([place]) 09/30/2015  . Parkinsonism ([place]) 09/30/2015  . Tremor 08/11/2015  . Suicidal ideation 05/10/2015  . Bipolar depression ([place]) 05/10/2015  . Complete heart block ([place]) 03/20/2015  . Bradycardia 03/14/2015  . Dementia with behavioral disturbance 02/15/2015  . Staphylococcus aureus bacteremia with sepsis ([place]) 01/28/2015  . Infection of pacemaker pocket ([place]) 01/28/2015  . Acute respiratory failure ([place]) 01/26/2015  . AKI (acute kidney injury) ([place]) 01/26/2015  . Acute encephalopathy 01/26/2015  . Altered mental status   . Vitamin D deficiency 01/07/2015  . Mobitz type 2 second degree atrioventricular block 12/12/2014  . Pacemaker 12/12/2014  . Symptomatic bradycardia 12/03/2014  . Lower back pain 06/12/2012  . Right hip pain 06/12/2012  . Intertrigo 05/09/2012  . Insomnia 04/19/2012  . General weakness 03/07/2012  . 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 Diarrhea 01/17/2012  . Pyelonephritis 01/15/2012  .  Diabetic peripheral neuropathy ([place]) 09/28/2011  . Urinary incontinence 09/28/2011  . Preventative health care 09/23/2011  . Gout 09/23/2011  . Bipolar affective disorder ([place]) 09/23/2011  . TIA (transient ischemic attack) 09/23/2011  . Chronic pain 09/23/2011  .  DNR (do not resuscitate) 09/23/2011  . CVA (cerebral infarction) 09/23/2011  . 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 History of alcohol abuse 09/23/2011  . Benign prostatic hyperplasia   . Left ventricular systolic dysfunction   . Chronic systolic CHF (congestive heart failure) ([place]) 05/05/2011  . Edema 01/03/2011  . CAD (coronary artery disease) 05/18/2010  . Hyperlipidemia 05/18/2010  . Other abnormality of urination(788.69) 11/25/2009  . Other specified forms of chronic ischemic heart disease 05/19/2009  . CAD, ARTERY BYPASS GRAFT 11/18/2008  . AMI, INFERIOR WALL 09/30/2008  . Obstructive sleep apnea 09/16/2008  . SINUS TACHYCARDIA 09/02/2008  . DM (diabetes mellitus), type 2 with peripheral vascular complications ([place]) [id number]  . OBESITY 09/01/2008  . Hypertensive heart disease with heart failure ([place]) 09/01/2008  . TOBACCO ABUSE 07/31/2007  . 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    CMP     Component Value Date/Time   NA 137 11/12/2015   K 4.3 11/12/2015   CL 102 03/20/2015 1038   CO2 28 03/20/2015 1038   GLUCOSE 171 (H) 03/20/2015 1038   BUN 31 (A) 11/12/2015   CREATININE 1.2 11/12/2015   CREATININE 1.23 03/20/2015 1038   CREATININE 1.06 12/05/2014 1536   CALCIUM 9.6 03/20/2015 1038   PROT 5.8 (L) 02/01/2015 0245   ALBUMIN 2.2 (L) 02/01/2015 0245   AST 30 11/12/2015   ALT 14 11/12/2015   ALKPHOS 29 11/12/2015   BILITOT 0.4 02/01/2015 0245   GFRNONAA 55 (L) 03/20/2015 1038   GFRAA >60 03/20/2015 1038   11/12/15 Vitamin D 16  Recent Labs  02/01/15 0245 02/02/15 0539  03/20/15 1038  09/09/15 09/24/15 11/12/15  NA 140 139  < > 140  < > 135* 135* 137  K 3.3* 3.3*  < > 4.1  < > 4.2 4.2 4.3  CL 103  107  --  102  --   --   --   --   CO2 29 26  --  28  --   --   --   --   GLUCOSE 209* 188*  --  171*  --   --   --   --   BUN 13 14  < > 23*  < > 24* 23* 31*  CREATININE 0.97 0.95  < > 1.23  < > 1.3 1.3 1.2  CALCIUM 9.4 9.3  --  9.6  --   --   --   --   < > = values in this interval not displayed.  Recent Labs  01/30/15 0252 01/31/15 0305 02/01/15 0245  07/29/15 08/10/15 11/12/15  AST 24 29 27   < > 42* 55* 30  ALT 27 30 30   < > 34 50* 14  ALKPHOS 49 58 56  < > 35 34 29  BILITOT 0.5 0.7 0.4  --   --   --   --   PROT 5.0* 5.5* 5.8*  --   --   --   --   ALBUMIN 2.0* 2.3* 2.2*  --   --   --   --   < > = values in this interval not displayed.  Recent Labs  12/05/14 1536 01/26/15 1200  01/31/15 0305 02/01/15 0245  03/20/15 1038  09/24/15 09/24/15 0840 11/12/15  WBC 6.8 12.1*  < > 5.2 6.4  < > 6.8  < > 4.5 12.5 6.0  NEUTROABS 4.0 10.5*  --   --   --   --   --   --   --   --   --  HGB 13.5 15.0  < > 13.2 13.6  < > 13.2  < > 42.3* 14.3 14.3  HCT 40.7 44.8  < > 37.9* 39.1  < > 40.6  < > 94* 42 44  MCV 93.1 92.9  < > 89.8 90.3  --  91.6  --   --   --   --   PLT 136* 142*  < > 128* 204  < > 175  < > 200 200 203  < > = values in this interval not displayed.  Recent Labs  04/29/15 07/29/15 11/12/15  CHOL 239* 165 172  TRIG 692* 623* 611*   Lab Results  Component Value Date   MICROALBUR 75.6 08/13/2015   Lab Results  Component Value Date   TSH 0.92 11/12/2015   Lab Results  Component Value Date   HGBA1C 11 11/12/2015   Lab Results  Component Value Date   CHOL 172 11/12/2015   HDL 33 (A) 11/12/2015   LDLCALC  08/04/2009    49        Total Cholesterol/HDL:CHD Risk Coronary Heart Disease Risk Table                     Men   Women  1/2 Average Risk   3.4   3.3  Average Risk       5.0   4.4  2 X Average Risk   9.6   7.1  3 X Average Risk  23.4   11.0        Use the calculated Patient Ratio above and the CHD Risk Table to determine the patient's CHD Risk.          ATP III CLASSIFICATION (LDL):  <100     mg/dL   Optimal  100-129  mg/dL   Near or Above                    Optimal  130-159  mg/dL   Borderline  160-189  mg/dL   High  >190     mg/dL   Very High   LDLDIRECT 85.2 06/13/2012   TRIG 611 (A) 11/12/2015   CHOLHDL 7 06/13/2012    Significant Diagnostic Results in last 30 days:  No results found.  Assessment and Plan  DM (diabetes mellitus), type 2 with peripheral vascular complications ([place]) [id number] A1c was 11; pt is completely non compliant with diet; today family visisyed and pt has a huge square of fudge on his table and chicken and dumplings; multiple conversations about diet have yeilded no understanding; plan to in glucophage to 1000 mg BID, cont  Insulin, long and short acting, statin and fish oil  Dementia with behavioral disturbance Stable, no major declines; pt however, has no insight at all; plan to contaricept 10 mg daily  Benign prostatic hyperplasia No reported problems, no UTI's or retension; PSA 2 weeks ago was 1.3; plan to cont cardura 1 mg qHS     Anne D. Victor Coil, MD

## 2015-11-29 ENCOUNTER — Encounter: Payer: Self-pay | Admitting: Internal Medicine

## 2015-11-29 NOTE — Assessment & Plan Note (Signed)
No reported problems, no UTI's or retension; PSA 2 weeks ago was 1.3; plan to cont cardura 1 mg qHS

## 2015-11-29 NOTE — Assessment & Plan Note (Signed)
Stable, no major declines; pt however, has no insight at all; plan to contaricept 10 mg daily

## 2015-11-29 NOTE — Assessment & Plan Note (Addendum)
10/2015 A1c was 11; pt is completely non compliant with diet; today family visisyed and pt has a huge square of fudge on his table and chicken and dumplings; multiple conversations about diet have yeilded no understanding; plan to in glucophage to 1000 mg BID, cont  Insulin, long and short acting, statin and fish oil

## 2015-12-14 ENCOUNTER — Ambulatory Visit (INDEPENDENT_AMBULATORY_CARE_PROVIDER_SITE_OTHER): Payer: Medicare Other | Admitting: Diagnostic Neuroimaging

## 2015-12-14 ENCOUNTER — Encounter: Payer: Self-pay | Admitting: Diagnostic Neuroimaging

## 2015-12-14 VITALS — BP 138/89 | HR 62

## 2015-12-14 DIAGNOSIS — G252 Other specified forms of tremor: Secondary | ICD-10-CM

## 2015-12-14 DIAGNOSIS — R269 Unspecified abnormalities of gait and mobility: Secondary | ICD-10-CM

## 2015-12-14 DIAGNOSIS — G214 Vascular parkinsonism: Secondary | ICD-10-CM | POA: Diagnosis not present

## 2015-12-14 DIAGNOSIS — R1310 Dysphagia, unspecified: Secondary | ICD-10-CM | POA: Diagnosis not present

## 2015-12-14 DIAGNOSIS — R471 Dysarthria and anarthria: Secondary | ICD-10-CM

## 2015-12-14 NOTE — Progress Notes (Signed)
[facility]  PATIENT: [name] DOB: 02/02/1940  REFERRING CLINICIAN: Inocencio Homes, MD HISTORY FROM: patient and wife  REASON FOR VISIT: follow up   HISTORICAL  CHIEF COMPLAINT:  Chief Complaint  Patient presents with  . Vascular Parkinsonism    rm 7, wife- [name], living at [facility]  . Follow-up    3 month, post CT    HISTORY OF PRESENT ILLNESS:   UPDATE 12/14/15: Since last visit, symptoms are stable. Carb/levo not helping with tremor control. No med side effects. Now off primidone.   PRIOR HPI (09/11/15): 75 year old male here for evaluation of tremor. Patient has complex medical history including coronary artery disease, ischemic cardiomyopathy, hypertension, diabetes, obstructive sleep apnea, gout, stroke, dementia, generalized functional decline.  Patient reports 6 month history of postural and action tremor, mainly affecting him when he is holding a cup or using utensils. Symptoms are worse in the past one month. Patient currently lives at [facility] rehabilitation for the past one year, and apparently carries a diagnosis of Parkinson's disease and dementia. Apparently he saw Dr. Erling Cruz 6 years ago and was diagnosed with "early Parkinson's disease". Patient saw PCP recently started primidone for tremor control, which has not helped.   REVIEW OF SYSTEMS: Full 14 system review of systems performed and negative with exception of: only as per HPI.    ALLERGIES: Allergies  Allergen Reactions  . Benzedrine [Amphetamine] Nausea Only  . Dexedrine [Dextroamphetamine Sulfate Er] Other (See Comments)    agitation  . Oxycodone Nausea And Vomiting    HOME MEDICATIONS: Outpatient Medications Prior to Visit  Medication Sig Dispense Refill  . acetaminophen (TYLENOL) 650 MG suppository Place 650 mg rectally every 6 (six) hours as needed for fever. For fever above 100F    . Artificial Tear Ointment (ARTIFICIAL TEARS) ointment Place 1 drop into both  eyes daily as needed.     [name] Kitchen aspirin 81 MG chewable tablet Chew 81 mg by mouth at bedtime.     . carbidopa-levodopa (SINEMET IR) 25-100 MG tablet Take 1 tablet by mouth 3 (three) times daily with meals.    . Cholecalciferol (VITAMIN D3) 50000 units CAPS Take 1 capsule by mouth once a week. On Monday    . clonazePAM (KLONOPIN) 0.5 MG tablet Take 1 tablet (0.5 mg total) by mouth See admin instructions. Take 1 tablet (0.5 mg) by mouth daily at bedtime, may also take 1 tablet (0.5 mg) 3 times daily as needed for anxiety 20 tablet 0  . docusate sodium (COLACE) 100 MG capsule Take 100 mg by mouth daily. Hold for > 2 loose stools in a day    . fenofibrate (TRICOR) 145 MG tablet Take 145 mg by mouth daily. With a meal    . guaifenesin (ROBAFEN) 100 MG/5ML syrup Take 100 mg by mouth every 8 (eight) hours as needed for cough.    . magnesium oxide (MAG-OX) 400 MG tablet Take 400 mg by mouth daily.     . Menthol, Topical Analgesic, (BIOFREEZE) 4 % GEL Apply to right elbow twice daily as needed for pain.    . nitroGLYCERIN (NITROSTAT) 0.4 MG SL tablet Place 0.4 mg under the tongue every 5 (five) minutes as needed for chest pain.    . polyethylene glycol (MIRALAX / GLYCOLAX) packet Take 17 g by mouth daily.    . traMADol (ULTRAM) 50 MG tablet Take by mouth 2 (two) times daily as needed.     . traZODone (DESYREL) 50 MG tablet  Take 50 mg by mouth at bedtime.    . Amino Acids-Protein Hydrolys (FEEDING SUPPLEMENT, PRO-STAT SUGAR FREE 64,) LIQD Take 30 mLs by mouth 2 (two) times daily.    . carvedilol (COREG) 3.125 MG tablet Take 3.125 mg by mouth 2 (two) times daily with a meal. Hold for SBP less than 105     . donepezil (ARICEPT) 10 MG tablet Take 10 mg by mouth at bedtime.    [name] Kitchen doxazosin (CARDURA) 1 MG tablet Take 1 mg by mouth at bedtime.     . furosemide (LASIX) 20 MG tablet Take 20 mg by mouth.    . gabapentin (NEURONTIN) 300 MG capsule 300 mg 3 (three) times daily.    . insulin lispro (HUMALOG KWIKPEN) 100  UNIT/ML KiwkPen Inject 5 Units into the skin 3 (three) times daily. Hold for CBG  < 200. ( to be given in addition to sliding scale) Check FSBS before meals and at bedtime. SSI 201-25 4 units, 251-300 6 units, 301-350 8 units, 351-400 10 units    . ketoconazole (NIZORAL) 2 % cream Apply 1 application topically daily. Apply to face/neck as needed    . lamoTRIgine (LAMICTAL) 100 MG tablet Take 100 mg by mouth 2 (two) times daily.     [name] Kitchen OLANZapine (ZYPREXA) 10 MG tablet Give one half tablet (5 mg) by mouth at night    . Omega-3 Fatty Acids (FISH OIL) 1000 MG CAPS Take 2 capsules by mouth daily.     . OXYGEN Inhale 2 L into the lungs continuous. To keep spo2 above 90%    . pravastatin (PRAVACHOL) 40 MG tablet Take 40 mg by mouth at bedtime.     . insulin detemir (LEVEMIR) 100 UNIT/ML injection Inject 40 Units into the skin daily.     . metFORMIN (GLUCOPHAGE) 500 MG tablet Take 500 mg by mouth 2 (two) times daily with a meal.      No facility-administered medications prior to visit.     PAST MEDICAL HISTORY: Past Medical History:  Diagnosis Date  . 2nd degree AV block    a. s/p STJ dual chamber pacemaker 11/2014  . Benign neoplasm of colon     Adenomatous polyps  . Bipolar affective disorder ([place])    . BPH (benign prostatic hypertrophy)   . CAD (coronary artery disease)    a. s/p CABG  . Chronic pain    . CVA (cerebral infarction)     Small left occipital  . Depression    . DM        . Gout    . History of alcohol abuse    . Hyperlipidemia    . HYPERTENSION        . Ischemic cardiomyopathy    . Narcolepsy    . Obesity   . OBSTRUCTIVE SLEEP APNEA        . [facility] spotted fever   . TIA (transient ischemic attack)    . UNIVERSAL ULCERATIVE COLITIS          PAST SURGICAL HISTORY: Past Surgical History:  Procedure Laterality Date  . CARDIAC CATHETERIZATION     Triple-vessel coronary artery disease. Low-normal left ventricular systolic function with mild   anteroapical  wall motion abnormality.   [name] Kitchen CARDIAC CATHETERIZATION N/A 01/28/2015   Procedure: Temporary Pacemaker;  Surgeon: [name], MD;  Location: [place] [facility];  Service: Cardiovascular;  Laterality: N/A;  . CORONARY ARTERY BYPASS GRAFT     Median sternotomy, extracorporeal circulation,  coronary artery bypass graft surgery x4 using a sequential left internal   mammary artery graft to the mid and distal left anterior descending, a   saphenous vein graft to diagonal branch of the left anterior descending,   and a saphenous vein graft to the obtuse marginal branch of left   circumflex coronary artery.    . EP IMPLANTABLE DEVICE N/A 12/12/2014   Procedure: Pacemaker Implant;  Surgeon: [name], MD;  Location: [place] [facility];  Service: Cardiovascular;  Laterality: N/A;  . EP IMPLANTABLE DEVICE N/A 01/28/2015   Procedure: PPM Generator Removal;  Surgeon: [name], MD;  Location: [place] [facility];  Service: Cardiovascular;  Laterality: N/A;  . EP IMPLANTABLE DEVICE N/A 03/20/2015   Procedure: Pacemaker Implant;  Surgeon: [name], MD;  Location: [place] [facility];  Service: Cardiovascular;  Laterality: N/A;  . FLEXIBLE SIGMOIDOSCOPY  01/17/2012   Procedure: FLEXIBLE SIGMOIDOSCOPY;  Surgeon: Lafayette Dragon, MD;  Location: [facility];  Service: Endoscopy;  Laterality: N/A;  . MULTIPLE EXTRACTIONS WITH ALVEOLOPLASTY  12/01/2011   Procedure: MULTIPLE EXTRACION WITH ALVEOLOPLASTY;  Surgeon: Lenn Cal, DDS;  Location: [place];  Service: Oral Surgery;  Laterality: N/A;  Extraction of tooth #'s 3,4,5,6,7,8,9,10,11,12,13,14,15,16,17,20,21,22,23,24,25,26,27,28,29,30,31,32 with alveoloplasty and bilateral mandibular lingual tori  . TONSILECTOMY, ADENOIDECTOMY, BILATERAL MYRINGOTOMY AND TUBES  1946    FAMILY HISTORY: Family History  Problem Relation Age of Onset  . Diabetes Father   . Heart disease Father   . Stroke Father   . Heart attack Mother   . Aortic aneurysm Mother   .  Alcohol abuse Other   . Arthritis Other   . Heart disease Other   . Mental illness Other   . Diabetes Other   . Alcohol abuse Other   . Arthritis Other   . Hyperlipidemia Other   . Heart disease Other   . Stroke Other   . Hypertension Other   . Diabetes Other   . Mental illness Other   . Heart disease Brother     SOCIAL HISTORY:  Social History   Social History  . Marital status: Married    Spouse name: [name]  . Number of children: 2  . Years of education: 12   Occupational History  . retired Retired    Ship broker   Social History Main Topics  . Smoking status: Former Smoker    Quit date: 08/18/2014  . Smokeless tobacco: Former Systems developer  . Alcohol use No     Comment: quit 2003  . Drug use: No  . Sexual activity: No   Other Topics Concern  . Not on file   Social History Narrative   Patient has been married for 51 years.   Patient presents with his wife today.    Patient has 2 grown children and lives in [place] and [place] areas respectively.   Patient is a nonsmoker, nondrinker at this time.   09/11/15 lives at [facility] and [place] since last year   Caffeine- 1 soda daily     PHYSICAL EXAM  GENERAL EXAM/CONSTITUTIONAL: Vitals:  Vitals:   12/14/15 1340  BP: 138/89  Pulse: 62   There is no height or weight on file to calculate BMI. No exam data present  Patient is in no distress; well developed, nourished and groomed; neck is supple  SLUMPED IN WHEEL CHAIR  SLURRED SPEECH, MASKED FACIES, HOARSE VOICE  CARDIOVASCULAR:  Examination of carotid arteries is normal; no carotid bruits  Regular rate and rhythm, no murmurs  Examination of peripheral vascular system by observation and palpation is normal  EYES:  Ophthalmoscopic exam of optic discs and posterior segments is normal; no papilledema or hemorrhages  MUSCULOSKELETAL:  Gait, strength, tone, movements noted in Neurologic exam below  NEUROLOGIC: MENTAL STATUS:  No flowsheet data  found.  awake, alert, oriented to person, place --> "30th, November, 2017"  [facility] memory   normal attention and concentration  language fluent, comprehension intact, naming intact,   fund of knowledge appropriate  CRANIAL NERVE:   2nd - no papilledema on fundoscopic exam  2nd, 3rd, 4th, 6th - pupils equal and reactive to light, visual fields full to confrontation, extraocular muscles intact, no nystagmus  5th - facial sensation symmetric  7th - facial strength symmetric  8th - hearing intact  9th - palate elevates symmetrically, uvula midline  11th - shoulder shrug symmetric  12th - tongue protrusion midline  MOTOR:   normal bulk  COGWHEELING IN BUE; INCREASED TONE IN BLE  BUE 4  BLE 2-3  POSTURAL AND ACTION TREMOR IN BUE  RESTING TREMOR IN RUE AND HEAD  SENSORY:   normal and symmetric to light touch; ABSENT VIBRATION AT TOES  COORDINATION:   finger-nose-finger, fine finger movements SLOW   REFLEXES:   deep tendon reflexes TRACE and symmetric  GAIT/STATION:   UNABLE TO STAND    DIAGNOSTIC DATA (LABS, IMAGING, TESTING) - I reviewed patient records, labs, notes, testing and imaging myself where available.  Lab Results  Component Value Date   WBC 6.0 11/12/2015   HGB 14.3 11/12/2015   HCT 44 11/12/2015   MCV 91.6 03/20/2015   PLT 203 11/12/2015      Component Value Date/Time   NA 137 11/12/2015   K 4.3 11/12/2015   CL 102 03/20/2015 1038   CO2 28 03/20/2015 1038   GLUCOSE 171 (H) 03/20/2015 1038   BUN 31 (A) 11/12/2015   CREATININE 1.2 11/12/2015   CREATININE 1.23 03/20/2015 1038   CREATININE 1.06 12/05/2014 1536   CALCIUM 9.6 03/20/2015 1038   PROT 5.8 (L) 02/01/2015 0245   ALBUMIN 2.2 (L) 02/01/2015 0245   AST 30 11/12/2015   ALT 14 11/12/2015   ALKPHOS 29 11/12/2015   BILITOT 0.4 02/01/2015 0245   GFRNONAA 55 (L) 03/20/2015 1038   GFRAA >60 03/20/2015 1038   Lab Results  Component Value Date   CHOL 172 11/12/2015   HDL 33  (A) 11/12/2015   LDLCALC  08/04/2009    49        Total Cholesterol/HDL:CHD Risk Coronary Heart Disease Risk Table                     Men   Women  1/2 Average Risk   3.4   3.3  Average Risk       5.0   4.4  2 X Average Risk   9.6   7.1  3 X Average Risk  23.4   11.0        Use the calculated Patient Ratio above and the CHD Risk Table to determine the patient's CHD Risk.        ATP III CLASSIFICATION (LDL):  <100     mg/dL   Optimal  100-129  mg/dL   Near or Above                    Optimal  130-159  mg/dL   Borderline  160-189  mg/dL  High  >190     mg/dL   Very High   LDLDIRECT 85.2 06/13/2012   TRIG 611 (A) 11/12/2015   CHOLHDL 7 06/13/2012   Lab Results  Component Value Date   HGBA1C 11 11/12/2015   Lab Results  Component Value Date   VITAMINB12 780 07/15/2008   Lab Results  Component Value Date   TSH 0.92 11/12/2015    01/26/15 CT head [I reviewed images myself and agree with interpretation. -VRP]  - Mildly progressive chronic small vessel ischemic changes compared with prior CT from 2014. No acute intracranial findings.  09/22/15 CT head (without) [I reviewed images myself and agree with interpretation. -VRP] 1. Moderate periventricular and subcortical chronic small vessel ischemic disease. 2. Chronic left occipital ischemic infarction. 3. Mild generalized and moderate perisylvian atrophy.  4. No acute findings. 5. No significant change from CT on 01/26/15.    ASSESSMENT AND PLAN  75 y.o. year old male here with progressive postural and action tremor, with history of Parkinson's disease diagnosis from 2011. Also with significant vascular risk factors. 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 Recommend trial of carbidopa levodopa for symptom control. Primidone has not been effective thus far at low dose and therefore was tapered off. Trial of carb/levo not helping either. Will try higher dose.    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 Postural tremor   3.  Action tremor   4. Gait difficulty   5. Dysarthria   6. Dysphagia, unspecified type      PLAN: - increase carbidopa/levodopa (25/100) to 2 tabs TID (with meals)  Return in about 6 months (around 06/12/2016).   [name], MD [id number], [id number] PM Certified in Neurology, Neurophysiology and Neuroimaging  [facility] [address] [place], [place] [address] [phone]

## 2015-12-24 ENCOUNTER — Encounter: Payer: Self-pay | Admitting: Internal Medicine

## 2015-12-24 ENCOUNTER — Non-Acute Institutional Stay (SKILLED_NURSING_FACILITY): Payer: Medicare Other | Admitting: Internal Medicine

## 2015-12-24 DIAGNOSIS — G218 Other secondary parkinsonism: Secondary | ICD-10-CM

## 2015-12-24 DIAGNOSIS — E1142 Type 2 diabetes mellitus with diabetic polyneuropathy: Secondary | ICD-10-CM | POA: Diagnosis not present

## 2015-12-24 DIAGNOSIS — I5022 Chronic systolic (congestive) heart failure: Secondary | ICD-10-CM | POA: Diagnosis not present

## 2015-12-24 NOTE — Progress Notes (Signed)
Location:  [facility] Room Number: [phone] Place of Service:  SNF [phone])  [name]. [name], MD  Patient Care Team: Biagio Borg, MD as PCP - General (Internal Medicine)  Extended Emergency Contact Information Primary Emergency Contact: [name],[name] Address: [address]          [place], [place] [address] [name] of [place] Phone: [phone] Mobile Phone: [phone] Relation: Spouse Secondary Emergency Contact: [name], [name] of [place] Phone: [phone] Mobile Phone: [phone] Relation: Daughter    Allergies: Benzedrine [amphetamine]; Dexedrine [dextroamphetamine sulfate er]; and Oxycodone  Chief Complaint  Patient presents with  . Medical Management of Chronic Issues    Routine Visit    HPI: Patient is 75 y.o. male who is being seen for routine issues of CHF, polyneuropathy and parkinsonism.   Past Medical History:  Diagnosis Date  . 2nd degree AV block    a. s/p STJ dual chamber pacemaker 11/2014  . Benign neoplasm of colon     Adenomatous polyps  . Bipolar affective disorder ([place])    . BPH (benign prostatic hypertrophy)   . CAD (coronary artery disease)    a. s/p CABG  . Chronic pain    . CVA (cerebral infarction)     Small left occipital  . Depression    . DM        . Gout    . History of alcohol abuse    . Hyperlipidemia    . HYPERTENSION        . Ischemic cardiomyopathy    . Narcolepsy    . Obesity   . OBSTRUCTIVE SLEEP APNEA        . [facility] spotted fever   . TIA (transient ischemic attack)    . UNIVERSAL ULCERATIVE COLITIS          Past Surgical History:  Procedure Laterality Date  . CARDIAC CATHETERIZATION     Triple-vessel coronary artery disease. Low-normal left ventricular systolic function with mild   anteroapical wall motion abnormality.   [name] Kitchen CARDIAC CATHETERIZATION N/A 01/28/2015   Procedure: Temporary Pacemaker;  Surgeon: [name], MD;  Location: [facility];  Service: Cardiovascular;  Laterality: N/A;  . CORONARY ARTERY BYPASS GRAFT     Median sternotomy, extracorporeal circulation,  coronary artery bypass graft surgery x4 using a sequential left internal   mammary artery graft to the mid and distal left anterior descending, a   saphenous vein graft to diagonal branch of the left anterior descending,   and a saphenous vein graft to the obtuse marginal branch of left   circumflex coronary artery.    . EP IMPLANTABLE DEVICE N/A 12/12/2014   Procedure: Pacemaker Implant;  Surgeon: [name], MD;  Location: [place] [facility];  Service: Cardiovascular;  Laterality: N/A;  . EP IMPLANTABLE DEVICE N/A 01/28/2015   Procedure: PPM Generator Removal;  Surgeon: [name], MD;  Location: [facility];  Service: Cardiovascular;  Laterality: N/A;  . EP IMPLANTABLE DEVICE N/A 03/20/2015   Procedure: Pacemaker Implant;  Surgeon: [name], MD;  Location: [facility];  Service: Cardiovascular;  Laterality: N/A;  . FLEXIBLE SIGMOIDOSCOPY  01/17/2012   Procedure: FLEXIBLE SIGMOIDOSCOPY;  Surgeon: Lafayette Dragon, MD;  Location: [facility];  Service: Endoscopy;  Laterality: N/A;  . MULTIPLE EXTRACTIONS WITH ALVEOLOPLASTY  12/01/2011   Procedure: MULTIPLE EXTRACION WITH ALVEOLOPLASTY;  Surgeon: Lenn Cal, DDS;  Location: [facility];  Service: Oral Surgery;  Laterality: N/A;  Extraction of tooth #'s 3,4,5,6,7,8,9,10,11,12,13,14,15,16,17,20,21,22,23,24,25,26,27,28,29,30,31,32 with alveoloplasty and bilateral mandibular lingual tori  . TONSILECTOMY, ADENOIDECTOMY, BILATERAL MYRINGOTOMY AND TUBES  1948    Allergies as of 12/24/2015      Reactions   Benzedrine [amphetamine] Nausea Only   Dexedrine [dextroamphetamine Sulfate Er] Other (See Comments)   agitation   Oxycodone Nausea And Vomiting      Medication List       Accurate as of 12/24/15 11:59 PM. Always use your most recent med list.            acetaminophen 650 MG suppository Commonly known as:  TYLENOL Place 650 mg rectally every 6 (six) hours as needed for fever. For fever above 100F   artificial tears ointment Place 1 drop into both eyes daily as needed.   aspirin 81 MG chewable tablet Chew 81 mg by mouth at bedtime.   BIOFREEZE 4 % Gel Generic drug:  Menthol (Topical Analgesic) Apply to right elbow twice daily as needed for pain.   carbidopa-levodopa 25-100 MG tablet Commonly known as:  SINEMET IR Take 2 tablets by mouth 3 (three) times daily with meals.   carvedilol 3.125 MG tablet Commonly known as:  COREG Take 3.125 mg by mouth 2 (two) times daily with a meal. Hold for SBP less than 105   clonazePAM 0.5 MG tablet Commonly known as:  KLONOPIN Take 1 tablet (0.5 mg total) by mouth See admin instructions. Take 1 tablet (0.5 mg) by mouth daily at bedtime, may also take 1 tablet (0.5 mg) 3 times daily as needed for anxiety   docusate sodium 100 MG capsule Commonly known as:  COLACE Take 100 mg by mouth daily. Hold for > 2 loose stools in a day   donepezil 10 MG tablet Commonly known as:  ARICEPT Take 10 mg by mouth at bedtime.   doxazosin 1 MG tablet Commonly known as:  CARDURA Take 1 mg by mouth at bedtime.   feeding supplement (PRO-STAT SUGAR FREE 64) Liqd Take 30 mLs by mouth 2 (two) times daily.   fenofibrate 145 MG tablet Commonly known as:  TRICOR Take 145 mg by mouth daily. With a meal   Fish Oil 1000 MG Caps Take 2 capsules by mouth 2 (two) times daily.   furosemide 20 MG tablet Commonly known as:  LASIX Take 20 mg by mouth.   gabapentin 300 MG capsule Commonly known as:  NEURONTIN 300 mg 3 (three) times daily.   HUMALOG KWIKPEN 100 UNIT/ML KiwkPen Generic drug:  insulin lispro Inject 5 Units into the skin 3 (three) times daily. Hold for CBG  < 200. ( to be given in addition to sliding scale) Check FSBS before meals and at bedtime. SSI 201-25 4 units, 251-300 6 units, 301-350 8 units,  351-400 10 units   ketoconazole 2 % cream Commonly known as:  NIZORAL Apply 1 application topically daily. Apply to face/neck as needed   lamoTRIgine 100 MG tablet Commonly known as:  LAMICTAL Take 100 mg by mouth 2 (two) times daily.   LEVEMIR FLEXTOUCH 100 UNIT/ML Pen Generic drug:  Insulin Detemir Inject 40 Units into the skin daily.   magnesium oxide 400 MG tablet Commonly known as:  MAG-OX Take 400 mg by mouth daily.   metFORMIN 1000 MG tablet Commonly known as:  GLUCOPHAGE 1,000 mg 2 (two) times daily.   nitroGLYCERIN 0.4 MG SL tablet Commonly known as:  NITROSTAT Place 0.4 mg under the tongue every 5 (five) minutes as needed for chest pain.   OLANZapine 10 MG tablet Commonly known as:  ZYPREXA Take 5 mg by mouth at bedtime. 1/2 tablet   OXYGEN Inhale 2 L into the lungs continuous. To keep spo2 above 90%   polyethylene glycol packet Commonly known as:  MIRALAX / GLYCOLAX Take 17 g by mouth daily.   pravastatin 40 MG tablet Commonly known as:  PRAVACHOL Take 40 mg by mouth at bedtime.   ROBAFEN 100 MG/5ML syrup Generic drug:  guaifenesin Take 100 mg by mouth every 8 (eight) hours as needed for cough.   traMADol 50 MG tablet Commonly known as:  ULTRAM Take 50 mg by mouth daily. At 9 am   traZODone 50 MG tablet Commonly known as:  DESYREL Take 50 mg by mouth at bedtime.   Vitamin D3 50000 units Caps Take 1 capsule by mouth once a week. On Monday       Meds ordered this encounter  Medications  . Cholecalciferol (VITAMIN D3) 50000 units CAPS    Sig: Take 1 capsule by mouth once a week. On Monday  . OLANZapine (ZYPREXA) 10 MG tablet    Sig: Take 5 mg by mouth at bedtime. 1/2 tablet  . traMADol (ULTRAM) 50 MG tablet    Sig: Take 50 mg by mouth daily. At 9 am    There is no immunization history for the selected administration types on file for this patient.  Social History  Substance Use Topics  . Smoking status: Former Smoker    Quit date:  08/18/2014  . Smokeless tobacco: Former Systems developer  . Alcohol use No     Comment: quit 2003    Review of Systems  DATA OBTAINED: from patient, nurse GENERAL:  no fevers, fatigue, appetite changes SKIN: No itching, rash HEENT: No complaint RESPIRATORY: No cough, wheezing, SOB CARDIAC: No chest pain, palpitations, lower extremity edema  GI: No abdominal pain, No N/V/D or constipation, No heartburn or reflux  GU: No dysuria, frequency or urgency, or incontinence  MUSCULOSKELETAL: No unrelieved bone/joint pain NEUROLOGIC: No headache, dizziness  PSYCHIATRIC: No overt anxiety or sadness  Vitals:   12/24/15 1201  BP: 114/74  Pulse: 84  Resp: 18  Temp: 98.5 F (36.9 C)   Body mass index is 33.69 kg/m. Physical Exam  GENERAL APPEARANCE: Alert, conversant, No acute distress , obese WM SKIN: No diaphoresis rash HEENT: Unremarkable RESPIRATORY: Breathing is even, unlabored. Lung sounds are clear   CARDIOVASCULAR: Heart RRR no murmurs, rubs or gallops. No peripheral edema  GASTROINTESTINAL: Abdomen is soft, non-tender, not distended w/ normal bowel sounds.  GENITOURINARY: Bladder non tender, not distended  MUSCULOSKELETAL: No abnormal joints or musculature NEUROLOGIC: Cranial nerves 2-12 grossly intact. Moves all extremities PSYCHIATRIC: ornery, some dementia, no behavioral issues  Patient Active Problem List   Diagnosis Date Noted  . Aspiration pneumonia ([place]) 09/30/2015  . Parkinsonism ([place]) 09/30/2015  . Tremor 08/11/2015  . Suicidal ideation 05/10/2015  . Bipolar depression ([place]) 05/10/2015  . Complete heart block ([place]) 03/20/2015  . Bradycardia 03/14/2015  . Dementia with behavioral disturbance 02/15/2015  . Staphylococcus aureus bacteremia with sepsis ([place]) 01/28/2015  . Infection of pacemaker pocket ([place]) 01/28/2015  . Acute respiratory failure ([place]) 01/26/2015  . AKI (acute kidney injury) ([place]) 01/26/2015  . Acute encephalopathy 01/26/2015  . Altered mental status   .  Vitamin D deficiency 01/07/2015  . Mobitz type 2 second degree atrioventricular block 12/12/2014  .  Pacemaker 12/12/2014  . Symptomatic bradycardia 12/03/2014  . Lower back pain 06/12/2012  . Right hip pain 06/12/2012  . Intertrigo 05/09/2012  . Insomnia 04/19/2012  . General weakness 03/07/2012  . 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 Diarrhea 01/17/2012  . Pyelonephritis 01/15/2012  . Diabetic peripheral neuropathy ([place]) 09/28/2011  . Urinary incontinence 09/28/2011  . Preventative health care 09/23/2011  . Gout 09/23/2011  . Bipolar affective disorder ([place]) 09/23/2011  . TIA (transient ischemic attack) 09/23/2011  . Chronic pain 09/23/2011  . DNR (do not resuscitate) 09/23/2011  . CVA (cerebral infarction) 09/23/2011  . 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 History of alcohol abuse 09/23/2011  . Benign prostatic hyperplasia   . Left ventricular systolic dysfunction   . Chronic systolic CHF (congestive heart failure) ([place]) 05/05/2011  . Edema 01/03/2011  . CAD (coronary artery disease) 05/18/2010  . Hyperlipidemia 05/18/2010  . Other abnormality of urination(788.69) 11/25/2009  . Other specified forms of chronic ischemic heart disease 05/19/2009  . CAD, ARTERY BYPASS GRAFT 11/18/2008  . AMI, INFERIOR WALL 09/30/2008  . Obstructive sleep apnea 09/16/2008  . SINUS TACHYCARDIA 09/02/2008  . DM (diabetes mellitus), type 2 with peripheral vascular complications ([place]) [id number]  . OBESITY 09/01/2008  . Hypertensive heart disease with heart failure ([place]) 09/01/2008  . TOBACCO ABUSE 07/31/2007  . 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    CMP     Component Value Date/Time   NA 137 11/12/2015   K 4.3 11/12/2015   CL 102 03/20/2015 1038   CO2 28 03/20/2015 1038   GLUCOSE 171 (H) 03/20/2015 1038   BUN 31 (A) 11/12/2015   CREATININE 1.2 11/12/2015   CREATININE 1.23 03/20/2015 1038   CREATININE 1.06 12/05/2014 1536   CALCIUM 9.6 03/20/2015 1038   PROT 5.8  (L) 02/01/2015 0245   ALBUMIN 2.2 (L) 02/01/2015 0245   AST 30 11/12/2015   ALT 14 11/12/2015   ALKPHOS 29 11/12/2015   BILITOT 0.4 02/01/2015 0245   GFRNONAA 55 (L) 03/20/2015 1038   GFRAA >60 03/20/2015 1038    Recent Labs  02/01/15 0245 02/02/15 0539  03/20/15 1038  09/09/15 09/24/15 11/12/15  NA 140 139  < > 140  < > 135* 135* 137  K 3.3* 3.3*  < > 4.1  < > 4.2 4.2 4.3  CL 103 107  --  102  --   --   --   --   CO2 29 26  --  28  --   --   --   --   GLUCOSE 209* 188*  --  171*  --   --   --   --   BUN 13 14  < > 23*  < > 24* 23* 31*  CREATININE 0.97 0.95  < > 1.23  < > 1.3 1.3 1.2  CALCIUM 9.4 9.3  --  9.6  --   --   --   --   < > = values in this interval not displayed.  Recent Labs  01/30/15 0252 01/31/15 0305 02/01/15 0245  08/10/15 09/25/15 11/12/15  AST 24 29 27   < > 55* 25 30  ALT 27 30 30   < > 50* 14 14  ALKPHOS 49 58 56  < > 34 44 29  BILITOT 0.5 0.7 0.4  --   --   --   --   PROT 5.0* 5.5* 5.8*  --   --   --   --   ALBUMIN 2.0* 2.3* 2.2*  --   --   --   --   < > =  values in this interval not displayed.  Recent Labs  01/26/15 1200  01/31/15 0305 02/01/15 0245  03/20/15 1038  09/24/15 09/24/15 0840 11/12/15  WBC 12.1*  < > 5.2 6.4  < > 6.8  < > 4.5 12.5 6.0  NEUTROABS 10.5*  --   --   --   --   --   --   --   --   --   HGB 15.0  < > 13.2 13.6  < > 13.2  < > 42.3* 14.3 14.3  HCT 44.8  < > 37.9* 39.1  < > 40.6  < > 94* 42 44  MCV 92.9  < > 89.8 90.3  --  91.6  --   --   --   --   PLT 142*  < > 128* 204  < > 175  < > 200 200 203  < > = values in this interval not displayed.  Recent Labs  04/29/15 07/29/15 08/14/15 11/12/15  CHOL 239* 165  --  172  LDLCALC  --   --  55  --   TRIG 692* 623*  --  611*   Lab Results  Component Value Date   MICROALBUR 75.6 08/13/2015   Lab Results  Component Value Date   TSH 0.92 11/12/2015   Lab Results  Component Value Date   HGBA1C 11 11/12/2015   Lab Results  Component Value Date   CHOL 172 11/12/2015    HDL 33 (A) 11/12/2015   LDLCALC 55 08/14/2015   LDLDIRECT 85.2 06/13/2012   TRIG 611 (A) 11/12/2015   CHOLHDL 7 06/13/2012    Significant Diagnostic Results in last 30 days:  No results found.  Assessment and Plan  Chronic systolic CHF (congestive heart failure) (HCC) Chronic and stable with EF 35%; PT AND FAMILY NON COMPLIANT WITH DIET; no recent GFR, pt's CR  Baseline at 1.2; cont lasix 20 mg daily and coreg 3.125 mg BID  Diabetic peripheral neuropathy ([place]) No reported concerns; plan to cont neurontin 100 mg BID and 200 mg qHs  Parkinsonism (HCC) Chronic, no major decline;pr's tremors have improved with sinement 25-100 TID; plan to cont current med    [employer]. [name], MD

## 2016-01-17 ENCOUNTER — Encounter: Payer: Self-pay | Admitting: Internal Medicine

## 2016-01-17 NOTE — Assessment & Plan Note (Signed)
No reported concerns; plan to cont neurontin 100 mg BID and 200 mg qHs

## 2016-01-17 NOTE — Assessment & Plan Note (Signed)
Chronic and stable with EF 35%; PT AND FAMILY NON COMPLIANT WITH DIET; no recent GFR, pt's CR  Baseline at 1.2; cont lasix 20 mg daily and coreg 3.125 mg BID

## 2016-01-17 NOTE — Assessment & Plan Note (Signed)
Chronic, no major decline;pr's tremors have improved with sinement 25-100 TID; plan to cont current med

## 2016-02-02 ENCOUNTER — Encounter: Payer: Self-pay | Admitting: Internal Medicine

## 2016-02-02 ENCOUNTER — Non-Acute Institutional Stay (SKILLED_NURSING_FACILITY): Payer: Medicare Other | Admitting: Internal Medicine

## 2016-02-02 DIAGNOSIS — F3130 Bipolar disorder, current episode depressed, mild or moderate severity, unspecified: Secondary | ICD-10-CM

## 2016-02-02 DIAGNOSIS — F3175 Bipolar disorder, in partial remission, most recent episode depressed: Secondary | ICD-10-CM | POA: Diagnosis not present

## 2016-02-02 DIAGNOSIS — F319 Bipolar disorder, unspecified: Secondary | ICD-10-CM

## 2016-02-02 DIAGNOSIS — E782 Mixed hyperlipidemia: Secondary | ICD-10-CM | POA: Diagnosis not present

## 2016-02-02 NOTE — Progress Notes (Signed)
Location:  [facility] Room Number: [phone] Place of Service:  SNF [phone])  [name], MD  Patient Care Team: Biagio Borg, MD as PCP - General (Internal Medicine)  Extended Emergency Contact Information Primary Emergency Contact: [name],[name] Address: [address]          [place], [place] [address] [name] of [place] Phone: [phone] Mobile Phone: [phone] Relation: Spouse Secondary Emergency Contact: [name], [name] of [place] Phone: [phone] Mobile Phone: [phone] Relation: Daughter    Allergies: Benzedrine [amphetamine]; Dexedrine [dextroamphetamine sulfate er]; and Oxycodone  Chief Complaint  Patient presents with  . Medical Management of Chronic Issues    Routine Visit    HPI: Patient is 76 y.o. male who is being seen for routine issues of bipolar disease, bipolare depression and HLD.  Past Medical History:  Diagnosis Date  . 2nd degree AV block    a. s/p STJ dual chamber pacemaker 11/2014  . Benign neoplasm of colon     Adenomatous polyps  . Bipolar affective disorder ([place])    . BPH (benign prostatic hypertrophy)   . CAD (coronary artery disease)    a. s/p CABG  . Chronic pain    . CVA (cerebral infarction)     Small left occipital  . Depression    . DM        . Gout    . History of alcohol abuse    . Hyperlipidemia    . HYPERTENSION        . Ischemic cardiomyopathy    . Narcolepsy    . Obesity   . OBSTRUCTIVE SLEEP APNEA        . [facility] spotted fever   . TIA (transient ischemic attack)    . UNIVERSAL ULCERATIVE COLITIS          Past Surgical History:  Procedure Laterality Date  . CARDIAC CATHETERIZATION     Triple-vessel coronary artery disease. Low-normal left ventricular systolic function with mild   anteroapical wall motion abnormality.   [name] Kitchen CARDIAC CATHETERIZATION N/A 01/28/2015   Procedure: Temporary Pacemaker;  Surgeon: [name], MD;  Location: [facility];  Service: Cardiovascular;  Laterality: N/A;  . CORONARY ARTERY BYPASS GRAFT     Median sternotomy, extracorporeal circulation,  coronary artery bypass graft surgery x4 using a sequential left internal   mammary artery graft to the mid and distal left anterior descending, a   saphenous vein graft to diagonal branch of the left anterior descending,   and a saphenous vein graft to the obtuse marginal branch of left   circumflex coronary artery.    . EP IMPLANTABLE DEVICE N/A 12/12/2014   Procedure: Pacemaker Implant;  Surgeon: [name], MD;  Location: [place] [facility];  Service: Cardiovascular;  Laterality: N/A;  . EP IMPLANTABLE DEVICE N/A 01/28/2015   Procedure: PPM Generator Removal;  Surgeon: [name], MD;  Location: [place] [facility];  Service: Cardiovascular;  Laterality: N/A;  . EP IMPLANTABLE DEVICE N/A 03/20/2015   Procedure: Pacemaker Implant;  Surgeon: [name], MD;  Location: [place] [facility];  Service: Cardiovascular;  Laterality: N/A;  . FLEXIBLE SIGMOIDOSCOPY  01/17/2012   Procedure: FLEXIBLE SIGMOIDOSCOPY;  Surgeon: Lafayette Dragon, MD;  Location: [facility];  Service: Endoscopy;  Laterality: N/A;  . MULTIPLE EXTRACTIONS WITH ALVEOLOPLASTY  12/01/2011   Procedure: MULTIPLE EXTRACION WITH  ALVEOLOPLASTY;  Surgeon: Lenn Cal, DDS;  Location: [facility];  Service: Oral Surgery;  Laterality: N/A;  Extraction of tooth #'s 3,4,5,6,7,8,9,10,11,12,13,14,15,16,17,20,21,22,23,24,25,26,27,28,29,30,31,32 with alveoloplasty and bilateral mandibular lingual tori  . TONSILECTOMY, ADENOIDECTOMY, BILATERAL MYRINGOTOMY AND TUBES  1946    Allergies as of 02/02/2016      Reactions   Benzedrine [amphetamine] Nausea Only   Dexedrine [dextroamphetamine Sulfate Er] Other (See Comments)   agitation   Oxycodone Nausea And Vomiting      Medication List       Accurate as of 02/02/16 11:59 PM. Always use your most recent med list.            acetaminophen 650 MG suppository Commonly known as:  TYLENOL Place 650 mg rectally every 6 (six) hours as needed for fever. For fever above 100F   artificial tears ointment Place 1 drop into both eyes daily as needed.   aspirin 81 MG chewable tablet Chew 81 mg by mouth at bedtime.   BIOFREEZE 4 % Gel Generic drug:  Menthol (Topical Analgesic) Apply to right elbow twice daily as needed for pain.   carbidopa-levodopa 25-100 MG tablet Commonly known as:  SINEMET IR Take 2 tablets by mouth 3 (three) times daily with meals.   carvedilol 3.125 MG tablet Commonly known as:  COREG Take 3.125 mg by mouth 2 (two) times daily with a meal. Hold for SBP less than 105   clonazePAM 0.5 MG tablet Commonly known as:  KLONOPIN Take 1 tablet (0.5 mg total) by mouth See admin instructions. Take 1 tablet (0.5 mg) by mouth daily at bedtime, may also take 1 tablet (0.5 mg) 3 times daily as needed for anxiety   docusate sodium 100 MG capsule Commonly known as:  COLACE Take 100 mg by mouth daily. Hold for > 2 loose stools in a day   donepezil 10 MG tablet Commonly known as:  ARICEPT Take 10 mg by mouth at bedtime.   doxazosin 1 MG tablet Commonly known as:  CARDURA Take 1 mg by mouth at bedtime.   feeding supplement (PRO-STAT SUGAR FREE 64) Liqd Take 30 mLs by mouth 2 (two) times daily.   fenofibrate 145 MG tablet Commonly known as:  TRICOR Take 145 mg by mouth daily. With a meal   Fish Oil 1000 MG Caps Take 2 capsules by mouth 2 (two) times daily.   furosemide 20 MG tablet Commonly known as:  LASIX Take 20 mg by mouth.   gabapentin 300 MG capsule Commonly known as:  NEURONTIN 300 mg 3 (three) times daily.   HUMALOG KWIKPEN 100 UNIT/ML KiwkPen Generic drug:  insulin lispro Inject 5 Units into the skin 3 (three) times daily. Hold for CBG  < 200. ( to be given in addition to sliding scale) Check FSBS before meals and at bedtime. SSI 201-25 4 units, 251-300 6 units, 301-350 8 units,  351-400 10 units   ketoconazole 2 % cream Commonly known as:  NIZORAL Apply 1 application topically daily. Apply to face/neck as needed   lamoTRIgine 100 MG tablet Commonly known as:  LAMICTAL Take 100 mg by mouth 2 (two) times daily.   LEVEMIR FLEXTOUCH 100 UNIT/ML Pen Generic drug:  Insulin Detemir Inject 40 Units into the skin daily.   magnesium oxide 400 MG tablet Commonly known as:  MAG-OX Take 400 mg by mouth daily.   metFORMIN 1000 MG tablet Commonly known as:  GLUCOPHAGE 1,000 mg 2 (two) times daily.   nitroGLYCERIN 0.4 MG SL tablet Commonly known  as:  NITROSTAT Place 0.4 mg under the tongue every 5 (five) minutes as needed for chest pain.   OLANZapine 10 MG tablet Commonly known as:  ZYPREXA Take 5 mg by mouth at bedtime. 1/2 tablet   OXYGEN Inhale 2 L into the lungs continuous. To keep spo2 above 90%   polyethylene glycol packet Commonly known as:  MIRALAX / GLYCOLAX Take 17 g by mouth daily.   pravastatin 40 MG tablet Commonly known as:  PRAVACHOL Take 40 mg by mouth at bedtime.   ROBAFEN 100 MG/5ML syrup Generic drug:  guaifenesin Take 100 mg by mouth every 8 (eight) hours as needed for cough.   traMADol 50 MG tablet Commonly known as:  ULTRAM Take 50 mg by mouth daily. At 9 am   traZODone 50 MG tablet Commonly known as:  DESYREL Take 50 mg by mouth at bedtime.   Vitamin D3 50000 units Caps Take 1 capsule by mouth once a week. On Monday       No orders of the defined types were placed in this encounter.   There is no immunization history for the selected administration types on file for this patient.  Social History  Substance Use Topics  . Smoking status: Former Smoker    Quit date: 08/18/2014  . Smokeless tobacco: Former Systems developer  . Alcohol use No     Comment: quit 2003    Review of Systems  DATA OBTAINED: from patient, nurse GENERAL:  no fevers, fatigue, appetite changes SKIN: No itching, rash HEENT: No complaint RESPIRATORY: No  cough, wheezing, SOB CARDIAC: No chest pain, palpitations, lower extremity edema  GI: No abdominal pain, No N/V/D or constipation, No heartburn or reflux  GU: No dysuria, frequency or urgency, or incontinence  MUSCULOSKELETAL: No unrelieved bone/joint pain NEUROLOGIC: No headache, dizziness  PSYCHIATRIC: No overt anxiety or sadness  Vitals:   02/02/16 1135  BP: 114/74  Pulse: 68  Resp: 20  Temp: 97.1 F (36.2 C)   Body mass index is 33.66 kg/m. Physical Exam  GENERAL APPEARANCE: Alert, mod conversant, No acute distress; obese WM  SKIN: No diaphoresis rash HEENT: Unremarkable RESPIRATORY: Breathing is even, unlabored. Lung sounds are clear   CARDIOVASCULAR: Heart RRR no murmurs, rubs or gallops. + peripheral edema  GASTROINTESTINAL: Abdomen is soft, non-tender, not distended w/ normal bowel sounds.  GENITOURINARY: Bladder non tender, not distended  MUSCULOSKELETAL: No abnormal joints or musculature NEUROLOGIC: Cranial nerves 2-12 grossly intact. Moves all extremities; some tremor PSYCHIATRIC: Mood and affect appropriate to situation with dementia, no behavioral issues  Patient Active Problem List   Diagnosis Date Noted  . Aspiration pneumonia ([place]) 09/30/2015  . Parkinsonism ([place]) 09/30/2015  . Tremor 08/11/2015  . Suicidal ideation 05/10/2015  . Bipolar depression ([place]) 05/10/2015  . Complete heart block ([place]) 03/20/2015  . Bradycardia 03/14/2015  . Dementia with behavioral disturbance 02/15/2015  . Staphylococcus aureus bacteremia with sepsis ([place]) 01/28/2015  . Infection of pacemaker pocket ([place]) 01/28/2015  . Acute respiratory failure ([place]) 01/26/2015  . AKI (acute kidney injury) ([place]) 01/26/2015  . Acute encephalopathy 01/26/2015  . Altered mental status   . Vitamin D deficiency 01/07/2015  . Mobitz type 2 second degree atrioventricular block 12/12/2014  . Pacemaker 12/12/2014  . Symptomatic bradycardia 12/03/2014  . Lower back pain 06/12/2012  . Right hip pain  06/12/2012  . Intertrigo 05/09/2012  . Insomnia 04/19/2012  . General weakness 03/07/2012  . 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 Diarrhea 01/17/2012  . Pyelonephritis 01/15/2012  .  Diabetic peripheral neuropathy ([place]) 09/28/2011  . Urinary incontinence 09/28/2011  . Preventative health care 09/23/2011  . Gout 09/23/2011  . Bipolar affective disorder ([place]) 09/23/2011  . TIA (transient ischemic attack) 09/23/2011  . Chronic pain 09/23/2011  . DNR (do not resuscitate) 09/23/2011  . CVA (cerebral infarction) 09/23/2011  . 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 History of alcohol abuse 09/23/2011  . Benign prostatic hyperplasia   . Left ventricular systolic dysfunction   . Chronic systolic CHF (congestive heart failure) ([place]) 05/05/2011  . Edema 01/03/2011  . CAD (coronary artery disease) 05/18/2010  . Hyperlipidemia 05/18/2010  . Other abnormality of urination(788.69) 11/25/2009  . Other specified forms of chronic ischemic heart disease 05/19/2009  . CAD, ARTERY BYPASS GRAFT 11/18/2008  . AMI, INFERIOR WALL 09/30/2008  . Obstructive sleep apnea 09/16/2008  . SINUS TACHYCARDIA 09/02/2008  . DM (diabetes mellitus), type 2 with peripheral vascular complications ([place]) [id number]  . OBESITY 09/01/2008  . Hypertensive heart disease with heart failure ([place]) 09/01/2008  . TOBACCO ABUSE 07/31/2007  . 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    CMP     Component Value Date/Time   NA 137 11/12/2015   K 4.3 11/12/2015   CL 102 03/20/2015 1038   CO2 28 03/20/2015 1038   GLUCOSE 171 (H) 03/20/2015 1038   BUN 31 (A) 11/12/2015   CREATININE 1.2 11/12/2015   CREATININE 1.23 03/20/2015 1038   CREATININE 1.06 12/05/2014 1536   CALCIUM 9.6 03/20/2015 1038   PROT 5.8 (L) 02/01/2015 0245   ALBUMIN 2.2 (L) 02/01/2015 0245   AST 30 11/12/2015   ALT 14 11/12/2015   ALKPHOS 29 11/12/2015   BILITOT 0.4 02/01/2015 0245   GFRNONAA 55 (L) 03/20/2015 1038   GFRAA >60 03/20/2015  1038    Recent Labs  03/20/15 1038  09/09/15 09/24/15 11/12/15  NA 140  < > 135* 135* 137  K 4.1  < > 4.2 4.2 4.3  CL 102  --   --   --   --   CO2 28  --   --   --   --   GLUCOSE 171*  --   --   --   --   BUN 23*  < > 24* 23* 31*  CREATININE 1.23  < > 1.3 1.3 1.2  CALCIUM 9.6  --   --   --   --   < > = values in this interval not displayed.  Recent Labs  08/10/15 09/25/15 11/12/15  AST 55* 25 30  ALT 50* 14 14  ALKPHOS 34 44 29    Recent Labs  03/20/15 1038  09/24/15 09/24/15 0840 11/12/15  WBC 6.8  < > 4.5 12.5 6.0  HGB 13.2  < > 42.3* 14.3 14.3  HCT 40.6  < > 94* 42 44  MCV 91.6  --   --   --   --   PLT 175  < > 200 200 203  < > = values in this interval not displayed.  Recent Labs  04/29/15 07/29/15 08/14/15 11/12/15  CHOL 239* 165  --  172  LDLCALC  --   --  55  --   TRIG 692* 623*  --  611*   Lab Results  Component Value Date   MICROALBUR 75.6 08/13/2015   Lab Results  Component Value Date   TSH 0.92 11/12/2015   Lab Results  Component Value Date   HGBA1C 11 11/12/2015   Lab Results  Component Value Date   CHOL  172 11/12/2015   HDL 33 (A) 11/12/2015   LDLCALC 55 08/14/2015   LDLDIRECT 85.2 06/13/2012   TRIG 611 (A) 11/12/2015   CHOLHDL 7 06/13/2012    Significant Diagnostic Results in last 30 days:  No results found.  Assessment and Plan  Bipolar affective disorder ([place]) Fair control; cont lamictal 100 mg BID and zyprexa 5 mg daily  Bipolar depression ([facility]) Chronic baseline;cont trazodone 50 mg qHS  Hyperlipidemia In 10/2015 HDL 33, TG 622 is similar to al,l previous values;cont tricor 145 mg daily, fish oil 1000 mg BID and pravachol 40 mg daily;pt is non compliant with diet    Webb Silversmith D. [name], MD

## 2016-02-03 ENCOUNTER — Encounter: Payer: Self-pay | Admitting: Internal Medicine

## 2016-02-03 NOTE — Assessment & Plan Note (Signed)
Fair control; cont lamictal 100 mg BID and zyprexa 5 mg daily

## 2016-02-03 NOTE — Assessment & Plan Note (Signed)
In 10/2015 HDL 33, TG 622 is similar to al,l previous values;cont tricor 145 mg daily, fish oil 1000 mg BID and pravachol 40 mg daily;pt is non compliant with diet

## 2016-02-03 NOTE — Assessment & Plan Note (Signed)
Chronic baseline; cont trazodone 50 mg qHS 

## 2016-02-10 LAB — BASIC METABOLIC PANEL
BUN: 25 mg/dL — AB (ref 4–21)
CREATININE: 0.9 mg/dL (ref 0.6–1.3)
Glucose: 168 mg/dL
POTASSIUM: 4.3 mmol/L (ref 3.4–5.3)
Sodium: 138 mmol/L (ref 137–147)

## 2016-02-10 LAB — HEMOGLOBIN A1C: HEMOGLOBIN A1C: 9

## 2016-02-18 ENCOUNTER — Non-Acute Institutional Stay (SKILLED_NURSING_FACILITY): Payer: Medicare Other | Admitting: Internal Medicine

## 2016-02-18 DIAGNOSIS — Z20828 Contact with and (suspected) exposure to other viral communicable diseases: Secondary | ICD-10-CM

## 2016-03-04 ENCOUNTER — Non-Acute Institutional Stay (SKILLED_NURSING_FACILITY): Payer: Medicare Other | Admitting: Internal Medicine

## 2016-03-04 ENCOUNTER — Encounter: Payer: Self-pay | Admitting: Internal Medicine

## 2016-03-04 DIAGNOSIS — I110 Hypertensive heart disease with heart failure: Secondary | ICD-10-CM | POA: Diagnosis not present

## 2016-03-04 DIAGNOSIS — I25708 Atherosclerosis of coronary artery bypass graft(s), unspecified, with other forms of angina pectoris: Secondary | ICD-10-CM | POA: Diagnosis not present

## 2016-03-04 DIAGNOSIS — N400 Enlarged prostate without lower urinary tract symptoms: Secondary | ICD-10-CM | POA: Diagnosis not present

## 2016-03-04 NOTE — Progress Notes (Signed)
Location:  [facility] Room Number: [id number] Place of Service:  SNF [phone])  [name], MD  Patient Care Team: Biagio Borg, MD as PCP - General (Internal Medicine)  Extended Emergency Contact Information Primary Emergency Contact: [name],[name] Address: [address]          [place], [place] [address] [name] of [place] Phone: [phone] Mobile Phone: [phone] Relation: Spouse Secondary Emergency Contact: [name], [name] Phone: [phone] Mobile Phone: [phone] Relation: Daughter    Allergies: Benzedrine [amphetamine]; Dexedrine [dextroamphetamine sulfate er]; and Oxycodone  Chief Complaint  Patient presents with  . Medical Management of Chronic Issues    Routine Visit    HPI: Patient is 76 y.o. male who is being seen for routine issues of BPH, CAD and HTN.  Past Medical History:  Diagnosis Date  . 2nd degree AV block    a. s/p STJ dual chamber pacemaker 11/2014  . Benign neoplasm of colon     Adenomatous polyps  . Bipolar affective disorder ([place])    . BPH (benign prostatic hypertrophy)   . CAD (coronary artery disease)    a. s/p CABG  . Chronic pain    . CVA (cerebral infarction)     Small left occipital  . Depression    . DM        . Gout    . History of alcohol abuse    . Hyperlipidemia    . HYPERTENSION        . Ischemic cardiomyopathy    . Narcolepsy    . Obesity   . OBSTRUCTIVE SLEEP APNEA        . [facility] spotted fever   . TIA (transient ischemic attack)    . UNIVERSAL ULCERATIVE COLITIS          Past Surgical History:  Procedure Laterality Date  . CARDIAC CATHETERIZATION     Triple-vessel coronary artery disease. Low-normal left ventricular systolic function with mild   anteroapical wall motion abnormality.   [name] Kitchen CARDIAC CATHETERIZATION N/A 01/28/2015   Procedure: Temporary Pacemaker;  Surgeon: [name], MD;  Location: [facility];  Service: Cardiovascular;  Laterality: N/A;  . CORONARY ARTERY BYPASS GRAFT     Median sternotomy, extracorporeal circulation,  coronary artery bypass graft surgery x4 using a sequential left internal   mammary artery graft to the mid and distal left anterior descending, a   saphenous vein graft to diagonal branch of the left anterior descending,   and a saphenous vein graft to the obtuse marginal branch of left   circumflex coronary artery.    . EP IMPLANTABLE DEVICE N/A 12/12/2014   Procedure: Pacemaker Implant;  Surgeon: [name], MD;  Location: [facility];  Service: Cardiovascular;  Laterality: N/A;  . EP IMPLANTABLE DEVICE N/A 01/28/2015   Procedure: PPM Generator Removal;  Surgeon: [name], MD;  Location: [facility];  Service: Cardiovascular;  Laterality: N/A;  . EP IMPLANTABLE DEVICE N/A 03/20/2015   Procedure: Pacemaker Implant;  Surgeon: [name], MD;  Location: [facility];  Service: Cardiovascular;  Laterality: N/A;  . FLEXIBLE SIGMOIDOSCOPY  01/17/2012   Procedure: FLEXIBLE SIGMOIDOSCOPY;  Surgeon: Lafayette Dragon, MD;  Location: [facility];  Service: Endoscopy;  Laterality: N/A;  . MULTIPLE EXTRACTIONS WITH ALVEOLOPLASTY  12/01/2011   Procedure: MULTIPLE EXTRACION WITH ALVEOLOPLASTY;  Surgeon: Lenn Cal, DDS;  Location: [facility];  Service: Oral Surgery;  Laterality: N/A;  Extraction of tooth #'s 3,4,5,6,7,8,9,10,11,12,13,14,15,16,17,20,21,22,23,24,25,26,27,28,29,30,31,32 with alveoloplasty and bilateral mandibular lingual tori  . TONSILECTOMY, ADENOIDECTOMY, BILATERAL MYRINGOTOMY AND TUBES  1948    Allergies as of 03/04/2016      Reactions   Benzedrine [amphetamine] Nausea Only   Dexedrine [dextroamphetamine Sulfate Er] Other (See Comments)   agitation   Oxycodone Nausea And Vomiting      Medication List       Accurate as of 03/04/16 11:59 PM. Always use your most recent med list.          acetaminophen 650 MG  suppository Commonly known as:  TYLENOL Place 650 mg rectally every 6 (six) hours as needed for fever. For fever above 100F   artificial tears ointment Place 1 drop into both eyes daily as needed.   aspirin 81 MG chewable tablet Chew 81 mg by mouth at bedtime.   BIOFREEZE 4 % Gel Generic drug:  Menthol (Topical Analgesic) Apply to right elbow twice daily as needed for pain.   carbidopa-levodopa 25-100 MG tablet Commonly known as:  SINEMET IR Take 2 tablets by mouth 3 (three) times daily with meals.   carvedilol 3.125 MG tablet Commonly known as:  COREG Take 3.125 mg by mouth 2 (two) times daily with a meal. Hold for SBP less than 105   clonazePAM 0.5 MG tablet Commonly known as:  KLONOPIN Take 1 tablet (0.5 mg total) by mouth See admin instructions. Take 1 tablet (0.5 mg) by mouth daily at bedtime, may also take 1 tablet (0.5 mg) 3 times daily as needed for anxiety   docusate sodium 100 MG capsule Commonly known as:  COLACE Take 100 mg by mouth daily. Hold for > 2 loose stools in a day   donepezil 10 MG tablet Commonly known as:  ARICEPT Take 10 mg by mouth at bedtime.   doxazosin 1 MG tablet Commonly known as:  CARDURA Take 1 mg by mouth at bedtime.   feeding supplement (PRO-STAT SUGAR FREE 64) Liqd Take 30 mLs by mouth 2 (two) times daily.   fenofibrate 145 MG tablet Commonly known as:  TRICOR Take 145 mg by mouth daily. With a meal   Fish Oil 1000 MG Caps Take 2 capsules by mouth 2 (two) times daily.   furosemide 20 MG tablet Commonly known as:  LASIX Take 20 mg by mouth.   gabapentin 300 MG capsule Commonly known as:  NEURONTIN 300 mg 3 (three) times daily.   HUMALOG KWIKPEN 100 UNIT/ML KiwkPen Generic drug:  insulin lispro Inject 5 Units into the skin 3 (three) times daily. Hold for CBG  < 200. ( to be given in addition to sliding scale) Check FSBS before meals and at bedtime. SSI 201-25 4 units, 251-300 6 units, 301-350 8 units, 351-400 10 units     ketoconazole 2 % cream Commonly known as:  NIZORAL Apply 1 application topically daily. Apply to face/neck as needed   lamoTRIgine 100 MG tablet Commonly known as:  LAMICTAL Take 100 mg by mouth 2 (two) times daily.   LEVEMIR FLEXTOUCH 100 UNIT/ML Pen Generic drug:  Insulin Detemir Inject 40 Units into the skin daily.   magnesium oxide 400 MG tablet Commonly known as:  MAG-OX Take 400 mg by mouth daily.   metFORMIN 1000 MG tablet Commonly known as:  GLUCOPHAGE 1,000 mg 2 (two) times daily.   nitroGLYCERIN 0.4 MG SL tablet Commonly known as:  NITROSTAT Place 0.4 mg under the tongue every 5 (five) minutes as needed for chest pain.   OLANZapine 2.5 MG tablet Commonly known as:  ZYPREXA Take 2.5 mg by mouth at bedtime.   OXYGEN Inhale 2 L into the lungs continuous. To keep spo2 above 90%   polyethylene glycol packet Commonly known as:  MIRALAX / GLYCOLAX Take 17 g by mouth daily.   pravastatin 40 MG tablet Commonly known as:  PRAVACHOL Take 40 mg by mouth at bedtime.   ROBAFEN 100 MG/5ML syrup Generic drug:  guaifenesin Take 100 mg by mouth every 8 (eight) hours as needed for cough.   traMADol 50 MG tablet Commonly known as:  ULTRAM Take 50 mg by mouth. Every shift for pain   traZODone 50 MG tablet Commonly known as:  DESYREL Take 50 mg by mouth at bedtime.   Vitamin D3 50000 units Caps Take 1 capsule by mouth once a week. On Mondays until 03/04/16 then stop taking       No orders of the defined types were placed in this encounter.   There is no immunization history for the selected administration types on file for this patient.  Social History  Substance Use Topics  . Smoking status: Former Smoker    Quit date: 08/18/2014  . Smokeless tobacco: Former Systems developer  . Alcohol use No     Comment: quit 2003    Review of Systems  DATA OBTAINED: from patient; pt not participating much; nurse GENERAL:  no fevers, fatigue, appetite changes SKIN: No itching,  rash HEENT: No complaint RESPIRATORY: No cough, wheezing, SOB CARDIAC: No chest pain, palpitations, lower extremity edema  GI: No abdominal pain, No N/V/D or constipation, No heartburn or reflux  GU: No dysuria, frequency or urgency, or incontinence  MUSCULOSKELETAL: No unrelieved bone/joint pain NEUROLOGIC: No headache, dizziness  PSYCHIATRIC: No overt anxiety or sadness  Vitals:   03/04/16 1127  BP: 114/74  Pulse: 66  Resp: 16  Temp: 98.6 F (37 C)   Body mass index is 33.03 kg/m. Physical Exam  GENERAL APPEARANCE: Alert, min conversant, No acute distress SKIN: No diaphoresis rash HEENT: Unremarkable RESPIRATORY: Breathing is even, unlabored. Lung sounds are clear   CARDIOVASCULAR: Heart RRR no murmurs, rubs or gallops. No peripheral edema  GASTROINTESTINAL: Abdomen is soft, non-tender, not distended w/ normal bowel sounds.  GENITOURINARY: Bladder non tender, not distended  MUSCULOSKELETAL: No abnormal joints or musculature NEUROLOGIC: Cranial nerves 2-12 grossly intact. Moves all extremities, with some rigidity and BUE action tremor; tremor to head PSYCHIATRIC: flat  Patient Active Problem List   Diagnosis Date Noted  . Aspiration pneumonia ([place]) 09/30/2015  . Parkinsonism ([place]) 09/30/2015  . Tremor 08/11/2015  . Suicidal ideation 05/10/2015  . Bipolar depression ([place]) 05/10/2015  . Complete heart block ([place]) 03/20/2015  . Bradycardia 03/14/2015  . Dementia with behavioral disturbance 02/15/2015  . Staphylococcus aureus bacteremia with sepsis ([place]) 01/28/2015  . Infection of pacemaker pocket ([place]) 01/28/2015  . Acute respiratory failure ([place]) 01/26/2015  . AKI (acute kidney injury) ([place]) 01/26/2015  . Acute encephalopathy 01/26/2015  . Altered mental status   . Vitamin D deficiency 01/07/2015  . Mobitz type 2 second degree atrioventricular block 12/12/2014  . Pacemaker 12/12/2014  . Symptomatic bradycardia 12/03/2014  . Lower back pain 06/12/2012  . Right hip  pain 06/12/2012  . Intertrigo 05/09/2012  . Insomnia 04/19/2012  . General weakness 03/07/2012  . 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 Diarrhea 01/17/2012  . Pyelonephritis 01/15/2012  .  Diabetic peripheral neuropathy ([place]) 09/28/2011  . Urinary incontinence 09/28/2011  . Preventative health care 09/23/2011  . Gout 09/23/2011  . Bipolar affective disorder ([place]) 09/23/2011  . TIA (transient ischemic attack) 09/23/2011  . Chronic pain 09/23/2011  . DNR (do not resuscitate) 09/23/2011  . CVA (cerebral infarction) 09/23/2011  . 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 History of alcohol abuse 09/23/2011  . Benign prostatic hyperplasia   . Left ventricular systolic dysfunction   . Chronic systolic CHF (congestive heart failure) ([place]) 05/05/2011  . Edema 01/03/2011  . CAD (coronary artery disease) 05/18/2010  . Hyperlipidemia 05/18/2010  . Other abnormality of urination(788.69) 11/25/2009  . Other specified forms of chronic ischemic heart disease 05/19/2009  . CAD, ARTERY BYPASS GRAFT 11/18/2008  . AMI, INFERIOR WALL 09/30/2008  . Obstructive sleep apnea 09/16/2008  . SINUS TACHYCARDIA 09/02/2008  . DM (diabetes mellitus), type 2 with peripheral vascular complications ([place]) [id number]  . OBESITY 09/01/2008  . Hypertensive heart disease with heart failure ([place]) 09/01/2008  . TOBACCO ABUSE 07/31/2007  . 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    CMP     Component Value Date/Time   NA 138 02/10/2016   K 4.3 02/10/2016   CL 102 03/20/2015 1038   CO2 28 03/20/2015 1038   GLUCOSE 171 (H) 03/20/2015 1038   BUN 25 (A) 02/10/2016   CREATININE 0.9 02/10/2016   CREATININE 1.23 03/20/2015 1038   CREATININE 1.06 12/05/2014 1536   CALCIUM 9.6 03/20/2015 1038   PROT 5.8 (L) 02/01/2015 0245   ALBUMIN 2.2 (L) 02/01/2015 0245   AST 30 11/12/2015   ALT 14 11/12/2015   ALKPHOS 29 11/12/2015   BILITOT 0.4 02/01/2015 0245   GFRNONAA 55 (L) 03/20/2015 1038   GFRAA >60  03/20/2015 1038    Recent Labs  09/24/15 11/12/15 02/10/16  NA 135* 137 138  K 4.2 4.3 4.3  BUN 23* 31* 25*  CREATININE 1.3 1.2 0.9    Recent Labs  08/10/15 09/25/15 11/12/15  AST 55* 25 30  ALT 50* 14 14  ALKPHOS 34 44 29    Recent Labs  09/24/15 09/24/15 0840 11/12/15  WBC 4.5 12.5 6.0  HGB 42.3* 14.3 14.3  HCT 94* 42 44  PLT 200 200 203    Recent Labs  04/29/15 07/29/15 08/14/15 11/12/15  CHOL 239* 165  --  172  LDLCALC  --   --  55  --   TRIG 692* 623*  --  611*   Lab Results  Component Value Date   MICROALBUR 75.6 08/13/2015   Lab Results  Component Value Date   TSH 0.92 11/12/2015   Lab Results  Component Value Date   HGBA1C 9 02/10/2016   Lab Results  Component Value Date   CHOL 172 11/12/2015   HDL 33 (A) 11/12/2015   LDLCALC 55 08/14/2015   LDLDIRECT 85.2 06/13/2012   TRIG 611 (A) 11/12/2015   CHOLHDL 7 06/13/2012    Significant Diagnostic Results in last 30 days:  No results found.  Assessment and Plan  Benign prostatic hyperplasia No reported problems; plan to cont cardura 1 mg daily  CAD (coronary artery disease) No reported CP or equivalent; cont ASa 81 mg daily, coreg 3.125 mg BID, pravachol 40 mg, fish oil  and fenofibrate  Hypertensive heart disease with heart failure (HCC) Controlled ;cont cardura 1 mg daily, coreg 3.125 mg BID and lasix 20 mg daily    Valarie Farace D. [name], MD

## 2016-03-10 ENCOUNTER — Encounter: Payer: Self-pay | Admitting: Internal Medicine

## 2016-03-10 ENCOUNTER — Non-Acute Institutional Stay (SKILLED_NURSING_FACILITY): Payer: Medicare Other | Admitting: Internal Medicine

## 2016-03-10 DIAGNOSIS — L570 Actinic keratosis: Secondary | ICD-10-CM

## 2016-03-10 NOTE — Progress Notes (Signed)
Location:  [facility] Room Number: [phone] Place of Service:  SNF [phone])  Victor Delaine. [name], MD  Patient Care Team: Biagio Borg, MD as PCP - General (Internal Medicine)  Extended Emergency Contact Information Primary Emergency Contact: [name],[name] Address: [address]          [place], [place] [address] [name] of [place] Phone: [phone] Mobile Phone: [phone] Relation: Spouse Secondary Emergency Contact: [name], [name] of [place] Phone: [phone] Mobile Phone: [phone] Relation: Daughter    Allergies: Benzedrine [amphetamine]; Dexedrine [dextroamphetamine sulfate er]; and Oxycodone  Chief Complaint  Patient presents with  . Acute Visit    Acute    HPI: Patient is 76 y.o. male who nursing asked me to see for lesions on his scalp.they have been there for a long time but recently one of them has started to bleed some. There is concern for skin cancer.  Past Medical History:  Diagnosis Date  . 2nd degree AV block    a. s/p STJ dual chamber pacemaker 11/2014  . Benign neoplasm of colon     Adenomatous polyps  . Bipolar affective disorder ([place])    . BPH (benign prostatic hypertrophy)   . CAD (coronary artery disease)    a. s/p CABG  . Chronic pain    . CVA (cerebral infarction)     Small left occipital  . Depression    . DM        . Gout    . History of alcohol abuse    . Hyperlipidemia    . HYPERTENSION        . Ischemic cardiomyopathy    . Narcolepsy    . Obesity   . OBSTRUCTIVE SLEEP APNEA        . [facility] spotted fever   . TIA (transient ischemic attack)    . UNIVERSAL ULCERATIVE COLITIS          Past Surgical History:  Procedure Laterality Date  . CARDIAC CATHETERIZATION     Triple-vessel coronary artery disease. Low-normal left ventricular systolic function with mild   anteroapical wall motion abnormality.   [name] Kitchen CARDIAC CATHETERIZATION N/A  01/28/2015   Procedure: Temporary Pacemaker;  Surgeon: [name], MD;  Location: [facility];  Service: Cardiovascular;  Laterality: N/A;  . CORONARY ARTERY BYPASS GRAFT     Median sternotomy, extracorporeal circulation,  coronary artery bypass graft surgery x4 using a sequential left internal   mammary artery graft to the mid and distal left anterior descending, a   saphenous vein graft to diagonal branch of the left anterior descending,   and a saphenous vein graft to the obtuse marginal branch of left   circumflex coronary artery.    . EP IMPLANTABLE DEVICE N/A 12/12/2014   Procedure: Pacemaker Implant;  Surgeon: [name], MD;  Location: [facility];  Service: Cardiovascular;  Laterality: N/A;  . EP IMPLANTABLE DEVICE N/A 01/28/2015   Procedure: PPM Generator Removal;  Surgeon: [name], MD;  Location: [facility];  Service: Cardiovascular;  Laterality: N/A;  . EP IMPLANTABLE DEVICE N/A 03/20/2015   Procedure: Pacemaker Implant;  Surgeon: [name], MD;  Location: [facility];  Service: Cardiovascular;  Laterality: N/A;  . FLEXIBLE SIGMOIDOSCOPY  01/17/2012   Procedure: FLEXIBLE SIGMOIDOSCOPY;  Surgeon: Lafayette Dragon, MD;  Location: [facility];  Service: Endoscopy;  Laterality: N/A;  . MULTIPLE EXTRACTIONS WITH ALVEOLOPLASTY  12/01/2011   Procedure: MULTIPLE EXTRACION WITH ALVEOLOPLASTY;  Surgeon: Lenn Cal, DDS;  Location: [facility];  Service: Oral Surgery;  Laterality: N/A;  Extraction of tooth #'s 3,4,5,6,7,8,9,10,11,12,13,14,15,16,17,20,21,22,23,24,25,26,27,28,29,30,31,32 with alveoloplasty and bilateral mandibular lingual tori  . TONSILECTOMY, ADENOIDECTOMY, BILATERAL MYRINGOTOMY AND TUBES  1946    Allergies as of 03/10/2016      Reactions   Benzedrine [amphetamine] Nausea Only   Dexedrine [dextroamphetamine Sulfate Er] Other (See Comments)   agitation   Oxycodone Nausea And Vomiting      Medication List       Accurate as of  03/10/16  3:37 PM. Always use your most recent med list.          acetaminophen 650 MG suppository Commonly known as:  TYLENOL Place 650 mg rectally every 6 (six) hours as needed for fever. For fever above 100F   artificial tears ointment Place 1 drop into both eyes daily as needed.   aspirin 81 MG chewable tablet Chew 81 mg by mouth at bedtime.   BIOFREEZE 4 % Gel Generic drug:  Menthol (Topical Analgesic) Apply to right elbow twice daily as needed for pain.   carbidopa-levodopa 25-100 MG tablet Commonly known as:  SINEMET IR Take 2 tablets by mouth 3 (three) times daily with meals.   carvedilol 3.125 MG tablet Commonly known as:  COREG Take 3.125 mg by mouth 2 (two) times daily with a meal. Hold for SBP less than 105   clonazePAM 0.5 MG tablet Commonly known as:  KLONOPIN Take 1 tablet (0.5 mg total) by mouth See admin instructions. Take 1 tablet (0.5 mg) by mouth daily at bedtime, may also take 1 tablet (0.5 mg) 3 times daily as needed for anxiety   docusate sodium 100 MG capsule Commonly known as:  COLACE Take 100 mg by mouth daily. Hold for > 2 loose stools in a day   donepezil 10 MG tablet Commonly known as:  ARICEPT Take 10 mg by mouth at bedtime.   doxazosin 1 MG tablet Commonly known as:  CARDURA Take 1 mg by mouth at bedtime.   feeding supplement (PRO-STAT SUGAR FREE 64) Liqd Take 30 mLs by mouth 2 (two) times daily.   fenofibrate 145 MG tablet Commonly known as:  TRICOR Take 145 mg by mouth daily. With a meal   Fish Oil 1000 MG Caps Take 2 capsules by mouth 2 (two) times daily.   furosemide 20 MG tablet Commonly known as:  LASIX Take 20 mg by mouth.   gabapentin 300 MG capsule Commonly known as:  NEURONTIN 300 mg 3 (three) times daily.   HUMALOG KWIKPEN 100 UNIT/ML KiwkPen Generic drug:  insulin lispro Inject 5 Units into the skin 3 (three) times daily. Hold for CBG  < 200. ( to be given in addition to sliding scale) Check FSBS before meals and  at bedtime. SSI 201-25 4 units, 251-300 6 units, 301-350 8 units, 351-400 10 units   ketoconazole 2 % cream Commonly known as:  NIZORAL Apply 1 application topically daily. Apply to face/neck as needed   lamoTRIgine 100 MG tablet Commonly known as:  LAMICTAL Take 100 mg by mouth 2 (two) times daily.   LEVEMIR FLEXTOUCH 100 UNIT/ML Pen Generic drug:  Insulin Detemir Inject 40 Units into the skin daily.   magnesium oxide 400 MG tablet Commonly known as:  MAG-OX Take 400 mg by mouth daily.   metFORMIN 1000 MG tablet Commonly known as:  GLUCOPHAGE 1,000 mg 2 (two) times daily.   nitroGLYCERIN 0.4 MG SL tablet Commonly known as:  NITROSTAT Place 0.4 mg under the tongue every 5 (five) minutes as needed for chest pain.   OLANZapine 2.5 MG tablet Commonly known as:  ZYPREXA Take 2.5 mg by mouth at bedtime.   OXYGEN Inhale 2 L into the lungs continuous. To keep spo2 above 90%   polyethylene glycol packet Commonly known as:  MIRALAX / GLYCOLAX Take 17 g by mouth daily.   pravastatin 40 MG tablet Commonly known as:  PRAVACHOL Take 40 mg by mouth at bedtime.   ROBAFEN 100 MG/5ML syrup Generic drug:  guaifenesin Take 100 mg by mouth every 8 (eight) hours as needed for cough.   traMADol 50 MG tablet Commonly known as:  ULTRAM Take 50 mg by mouth. Every shift for pain   traZODone 50 MG tablet Commonly known as:  DESYREL Take 50 mg by mouth at bedtime.   Vitamin D3 50000 units Caps Take 1 capsule by mouth once a week. On Mondays until 03/04/16 then stop taking       No orders of the defined types were placed in this encounter.   There is no immunization history for the selected administration types on file for this patient.  Social History  Substance Use Topics  . Smoking status: Former Smoker    Quit date: 08/18/2014  . Smokeless tobacco: Former Systems developer  . Alcohol use No     Comment: quit 2003    Review of Systems  DATA OBTAINED: from patient- limited  participation today nurse- as per HPI GENERAL:  no fevers, fatigue, appetite changes SKIN:pt denies itching or pain HEENT: No complaint RESPIRATORY: No cough, wheezing, SOB CARDIAC: No chest pain, palpitations, lower extremity edema  GI: No abdominal pain, No N/V/D or constipation, No heartburn or reflux  GU: No dysuria, frequency or urgency, or incontinence  MUSCULOSKELETAL: No unrelieved bone/joint pain NEUROLOGIC: No headache, dizziness  PSYCHIATRIC: No overt anxiety or sadness  Vitals:   03/10/16 1535  BP: 114/74  Pulse: 66  Resp: 16  Temp: 98.6 F (37 C)   Body mass index is 33.03 kg/m. Physical Exam  GENERAL APPEARANCE: Alert, conversant, No acute distress  SKIN: multiple small scaley lesions on bare scalp; no moles, no lesions with rolled pearly edges, no bloody lesions at this time HEENT: Unremarkable RESPIRATORY: Breathing is even, unlabored. Lung sounds are clear   CARDIOVASCULAR: Heart RRR no murmurs, rubs or gallops. No peripheral edema  GASTROINTESTINAL: Abdomen is soft, non-tender, not distended w/ normal bowel sounds.  GENITOURINARY: Bladder non tender, not distended  MUSCULOSKELETAL: No abnormal joints or musculature NEUROLOGIC: Cranial nerves 2-12 grossly intact. Moves all extremities PSYCHIATRIC: Mood and affect with dementia, no behavioral issues  Patient Active Problem List   Diagnosis Date Noted  . Aspiration pneumonia ([place]) 09/30/2015  . Parkinsonism ([place]) 09/30/2015  . Tremor 08/11/2015  . Suicidal ideation 05/10/2015  . Bipolar depression ([place]) 05/10/2015  . Complete heart block ([place]) 03/20/2015  . Bradycardia 03/14/2015  . Dementia with behavioral disturbance 02/15/2015  . Staphylococcus aureus bacteremia with sepsis ([place]) 01/28/2015  . Infection of pacemaker pocket ([place]) 01/28/2015  . Acute respiratory failure ([place]) 01/26/2015  . AKI (acute kidney injury) ([place]) 01/26/2015  . Acute encephalopathy 01/26/2015  . Altered mental status   .  Vitamin D deficiency 01/07/2015  . Mobitz type 2 second degree atrioventricular block 12/12/2014  . Pacemaker 12/12/2014  . Symptomatic bradycardia 12/03/2014  . Lower back  pain 06/12/2012  . Right hip pain 06/12/2012  . Intertrigo 05/09/2012  . Insomnia 04/19/2012  . General weakness 03/07/2012  . 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 Diarrhea 01/17/2012  . Pyelonephritis 01/15/2012  . Diabetic peripheral neuropathy ([place]) 09/28/2011  . Urinary incontinence 09/28/2011  . Preventative health care 09/23/2011  . Gout 09/23/2011  . Bipolar affective disorder ([place]) 09/23/2011  . TIA (transient ischemic attack) 09/23/2011  . Chronic pain 09/23/2011  . DNR (do not resuscitate) 09/23/2011  . CVA (cerebral infarction) 09/23/2011  . 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 History of alcohol abuse 09/23/2011  . Benign prostatic hyperplasia   . Left ventricular systolic dysfunction   . Chronic systolic CHF (congestive heart failure) ([place]) 05/05/2011  . Edema 01/03/2011  . CAD (coronary artery disease) 05/18/2010  . Hyperlipidemia 05/18/2010  . Other abnormality of urination(788.69) 11/25/2009  . Other specified forms of chronic ischemic heart disease 05/19/2009  . CAD, ARTERY BYPASS GRAFT 11/18/2008  . AMI, INFERIOR WALL 09/30/2008  . Obstructive sleep apnea 09/16/2008  . SINUS TACHYCARDIA 09/02/2008  . DM (diabetes mellitus), type 2 with peripheral vascular complications ([place]) [id number]  . OBESITY 09/01/2008  . Hypertensive heart disease with heart failure ([place]) 09/01/2008  . TOBACCO ABUSE 07/31/2007  . 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    CMP     Component Value Date/Time   NA 138 02/10/2016   K 4.3 02/10/2016   CL 102 03/20/2015 1038   CO2 28 03/20/2015 1038   GLUCOSE 171 (H) 03/20/2015 1038   BUN 25 (A) 02/10/2016   CREATININE 0.9 02/10/2016   CREATININE 1.23 03/20/2015 1038   CREATININE 1.06 12/05/2014 1536   CALCIUM 9.6 03/20/2015 1038   PROT 5.8  (L) 02/01/2015 0245   ALBUMIN 2.2 (L) 02/01/2015 0245   AST 30 11/12/2015   ALT 14 11/12/2015   ALKPHOS 29 11/12/2015   BILITOT 0.4 02/01/2015 0245   GFRNONAA 55 (L) 03/20/2015 1038   GFRAA >60 03/20/2015 1038    Recent Labs  03/20/15 1038  09/24/15 11/12/15 02/10/16  NA 140  < > 135* 137 138  K 4.1  < > 4.2 4.3 4.3  CL 102  --   --   --   --   CO2 28  --   --   --   --   GLUCOSE 171*  --   --   --   --   BUN 23*  < > 23* 31* 25*  CREATININE 1.23  < > 1.3 1.2 0.9  CALCIUM 9.6  --   --   --   --   < > = values in this interval not displayed.  Recent Labs  08/10/15 09/25/15 11/12/15  AST 55* 25 30  ALT 50* 14 14  ALKPHOS 34 44 29    Recent Labs  03/20/15 1038  09/24/15 09/24/15 0840 11/12/15  WBC 6.8  < > 4.5 12.5 6.0  HGB 13.2  < > 42.3* 14.3 14.3  HCT 40.6  < > 94* 42 44  MCV 91.6  --   --   --   --   PLT 175  < > 200 200 203  < > = values in this interval not displayed.  Recent Labs  04/29/15 07/29/15 08/14/15 11/12/15  CHOL 239* 165  --  172  LDLCALC  --   --  55  --   TRIG 692* 623*  --  611*   Lab Results  Component Value Date   MICROALBUR 75.6  08/13/2015   Lab Results  Component Value Date   TSH 0.92 11/12/2015   Lab Results  Component Value Date   HGBA1C 9 02/10/2016   Lab Results  Component Value Date   CHOL 172 11/12/2015   HDL 33 (A) 11/12/2015   LDLCALC 55 08/14/2015   LDLDIRECT 85.2 06/13/2012   TRIG 611 (A) 11/12/2015   CHOLHDL 7 06/13/2012    Significant Diagnostic Results in last 30 days:  No results found.  Assessment and Plan  ACTINIC KERATOSES - I think;hoewever pt with very pale skin ans obvious sun damage so I have written for outpt Derm consult.     Victor Delaine. [name], MD

## 2016-03-15 LAB — HM DIABETES EYE EXAM

## 2016-03-19 ENCOUNTER — Encounter: Payer: Self-pay | Admitting: Internal Medicine

## 2016-03-25 ENCOUNTER — Encounter: Payer: Self-pay | Admitting: Internal Medicine

## 2016-03-25 NOTE — Assessment & Plan Note (Signed)
No reported problems; plan to cont cardura 1 mg daily

## 2016-03-25 NOTE — Assessment & Plan Note (Signed)
No reported CP or equivalent; cont ASa 81 mg daily, coreg 3.125 mg BID, pravachol 40 mg, fish oil  and fenofibrate

## 2016-03-26 NOTE — Assessment & Plan Note (Signed)
Controlled ;cont cardura 1 mg daily, coreg 3.125 mg BID and lasix 20 mg daily

## 2016-03-30 ENCOUNTER — Encounter: Payer: Self-pay | Admitting: Internal Medicine

## 2016-03-30 NOTE — Progress Notes (Signed)
Location:  [facility] Room Number: [phone] Place of Service:  SNF [phone])  [name]. Victor Coil, MD  Patient Care Team: Victor Borg, MD as PCP - General (Internal Medicine)  Extended Emergency Contact Information Primary Emergency Contact: [name] Address: [address]          [place], [place] [address] [name] of [place] Phone: [phone] Mobile Phone: [phone] Relation: Spouse Secondary Emergency Contact: [name],  [name] of [place] Phone: [phone] Mobile Phone: [phone] Relation: Daughter    Allergies: Benzedrine [amphetamine]; Dexedrine [dextroamphetamine sulfate er]; and Oxycodone  Chief Complaint  Patient presents with  . Acute Visit    HPI: Patient is 75 y.o. male who is being seen acutely because an outbreak of Influenza A per CDC guidelines was recognized on 02/17/2016. Pt has no c/o flu like symptoms;therefore pt will need to be prophylaxed with Tamiflu for a minimum of 14 days per CDC protocol.  Past Medical History:  Diagnosis Date  . 2nd degree AV block    a. s/p STJ dual chamber pacemaker 11/2014  . Benign neoplasm of colon     Adenomatous polyps  . Bipolar affective disorder ([place])    . BPH (benign prostatic hypertrophy)   . CAD (coronary artery disease)    a. s/p CABG  . Chronic pain    . CVA (cerebral infarction)     Small left occipital  . Depression    . DM        . Gout    . History of alcohol abuse    . Hyperlipidemia    . HYPERTENSION        . Ischemic cardiomyopathy    . Narcolepsy    . Obesity   . OBSTRUCTIVE SLEEP APNEA        . [facility] spotted fever   . TIA (transient ischemic attack)    . UNIVERSAL ULCERATIVE COLITIS          Past Surgical History:  Procedure Laterality Date  . CARDIAC CATHETERIZATION     Triple-vessel coronary artery disease. Low-normal left ventricular systolic function with mild   anteroapical wall motion  abnormality.   [name] Kitchen CARDIAC CATHETERIZATION N/A 01/28/2015   Procedure: Temporary Pacemaker;  Surgeon: [name], MD;  Location: [facility];  Service: Cardiovascular;  Laterality: N/A;  . CORONARY ARTERY BYPASS GRAFT     Median sternotomy, extracorporeal circulation,  coronary artery bypass graft surgery x4 using a sequential left internal   mammary artery graft to the mid and distal left anterior descending, a   saphenous vein graft to diagonal branch of the left anterior descending,   and a saphenous vein graft to the obtuse marginal branch of left   circumflex coronary artery.    . EP IMPLANTABLE DEVICE N/A 12/12/2014   Procedure: Pacemaker Implant;  Surgeon: [name], MD;  Location: [facility];  Service: Cardiovascular;  Laterality: N/A;  . EP IMPLANTABLE DEVICE N/A 01/28/2015   Procedure: PPM Generator Removal;  Surgeon: [name], MD;  Location: [facility];  Service: Cardiovascular;  Laterality: N/A;  . EP IMPLANTABLE DEVICE N/A 03/20/2015   Procedure: Pacemaker Implant;  Surgeon: [name], MD;  Location: [facility];  Service: Cardiovascular;  Laterality: N/A;  . FLEXIBLE SIGMOIDOSCOPY  01/17/2012   Procedure: FLEXIBLE SIGMOIDOSCOPY;  Surgeon: Lafayette Dragon, MD;  Location: [facility];  Service: Endoscopy;  Laterality: N/A;  . MULTIPLE EXTRACTIONS WITH ALVEOLOPLASTY  12/01/2011   Procedure: MULTIPLE EXTRACION WITH ALVEOLOPLASTY;  Surgeon: Lenn Cal, DDS;  Location: [facility];  Service: Oral Surgery;  Laterality: N/A;  Extraction of tooth #'s 3,4,5,6,7,8,9,10,11,12,13,14,15,16,17,20,21,22,23,24,25,26,27,28,29,30,31,32 with alveoloplasty and bilateral mandibular lingual tori  . TONSILECTOMY, ADENOIDECTOMY, BILATERAL MYRINGOTOMY AND TUBES  1948    Allergies as of 02/18/2016      Reactions   Benzedrine [amphetamine] Nausea Only   Dexedrine [dextroamphetamine Sulfate Er] Other (See Comments)   agitation   Oxycodone Nausea And Vomiting        Medication List       Accurate as of 02/18/16 11:59 PM. Always use your most recent med list.          acetaminophen 650 MG suppository Commonly known as:  TYLENOL Place 650 mg rectally every 6 (six) hours as needed for fever. For fever above 100F   artificial tears ointment Place 1 drop into both eyes daily as needed.   aspirin 81 MG chewable tablet Chew 81 mg by mouth at bedtime.   BIOFREEZE 4 % Gel Generic drug:  Menthol (Topical Analgesic) Apply to right elbow twice daily as needed for pain.   carbidopa-levodopa 25-100 MG tablet Commonly known as:  SINEMET IR Take 2 tablets by mouth 3 (three) times daily with meals.   carvedilol 3.125 MG tablet Commonly known as:  COREG Take 3.125 mg by mouth 2 (two) times daily with a meal. Hold for SBP less than 105   clonazePAM 0.5 MG tablet Commonly known as:  KLONOPIN Take 1 tablet (0.5 mg total) by mouth See admin instructions. Take 1 tablet (0.5 mg) by mouth daily at bedtime, may also take 1 tablet (0.5 mg) 3 times daily as needed for anxiety   docusate sodium 100 MG capsule Commonly known as:  COLACE Take 100 mg by mouth daily. Hold for > 2 loose stools in a day   donepezil 10 MG tablet Commonly known as:  ARICEPT Take 10 mg by mouth at bedtime.   doxazosin 1 MG tablet Commonly known as:  CARDURA Take 1 mg by mouth at bedtime.   fenofibrate 145 MG tablet Commonly known as:  TRICOR Take 145 mg by mouth daily. With a meal   Fish Oil 1000 MG Caps Take 2 capsules by mouth 2 (two) times daily.   furosemide 20 MG tablet Commonly known as:  LASIX Take 20 mg by mouth.   gabapentin 300 MG capsule Commonly known as:  NEURONTIN 300 mg 3 (three) times daily.   HUMALOG KWIKPEN 100 UNIT/ML KiwkPen Generic drug:  insulin lispro Inject 5 Units into the skin 3 (three) times daily. Hold for CBG  < 200. ( to be given in addition to sliding scale) Check FSBS before meals and at bedtime. SSI 201-25 4 units, 251-300 6 units,  301-350 8 units, 351-400 10 units   ketoconazole 2 % cream Commonly known as:  NIZORAL Apply 1 application topically daily. Apply to face/neck as needed   lamoTRIgine 100 MG tablet Commonly known as:  LAMICTAL Take 100 mg by mouth 2 (two) times daily.   LEVEMIR FLEXTOUCH 100 UNIT/ML Pen Generic drug:  Insulin Detemir Inject 40 Units into the skin daily.   magnesium oxide 400 MG tablet Commonly known as:  MAG-OX Take 400 mg by mouth daily.   metFORMIN 1000 MG tablet Commonly known as:  GLUCOPHAGE 1,000 mg 2 (two) times daily.   nitroGLYCERIN 0.4 MG SL tablet Commonly  known as:  NITROSTAT Place 0.4 mg under the tongue every 5 (five) minutes as needed for chest pain.   OLANZapine 2.5 MG tablet Commonly known as:  ZYPREXA Take 2.5 mg by mouth at bedtime.   OXYGEN Inhale 2 L into the lungs continuous. To keep spo2 above 90%   polyethylene glycol packet Commonly known as:  MIRALAX / GLYCOLAX Take 17 g by mouth daily.   pravastatin 40 MG tablet Commonly known as:  PRAVACHOL Take 40 mg by mouth at bedtime.   ROBAFEN 100 MG/5ML syrup Generic drug:  guaifenesin Take 100 mg by mouth every 8 (eight) hours as needed for cough.   traMADol 50 MG tablet Commonly known as:  ULTRAM Take 50 mg by mouth. Every shift for pain   traZODone 50 MG tablet Commonly known as:  DESYREL Take 50 mg by mouth at bedtime.       No orders of the defined types were placed in this encounter.   There is no immunization history for the selected administration types on file for this patient.  Social History  Substance Use Topics  . Smoking status: Former Smoker    Quit date: 08/18/2014  . Smokeless tobacco: Former Systems developer  . Alcohol use No     Comment: quit 2003    Review of Systems  DATA OBTAINED: from patient, nurse GENERAL:  no fevers SKIN: No itching, rash HEENT: no rhinorrhea, congestion, ST or ear pain RESPIRATORY: No cough, wheezing, SOB CARDIAC: No chest pain, palpitations,  lower extremity edema  GI: No abdominal pain, No N/V/D or constipation, No heartburn or reflux  MUSCULOSKELETAL: No muscle aches NEUROLOGIC: No headache, dizziness   Vitals:   02/18/16 1556  BP: 114/75  Pulse: 75  Resp: 18  Temp: 97.7 F (36.5 C)   Body mass index is 32.72 kg/m. Physical Exam  GENERAL APPEARANCE: Alert, conversant, No acute distress  SKIN: No diaphoresis rash HEENT: Unremarkable RESPIRATORY: Breathing is even, unlabored. Lung sounds are clear   CARDIOVASCULAR: Heart RRR no murmurs, rubs or gallops. No peripheral edema  GASTROINTESTINAL: Abdomen is soft, non-tender, not distended w/ normal bowel sounds.   NEUROLOGIC: Cranial nerves 2-12 grossly intact PSYCHIATRIC: baseline, no mental status changes  Patient Active Problem List   Diagnosis Date Noted  . Aspiration pneumonia ([place]) 09/30/2015  . Parkinsonism ([place]) 09/30/2015  . Tremor 08/11/2015  . Suicidal ideation 05/10/2015  . Bipolar depression ([place]) 05/10/2015  . Complete heart block ([place]) 03/20/2015  . Bradycardia 03/14/2015  . Dementia with behavioral disturbance 02/15/2015  . Staphylococcus aureus bacteremia with sepsis ([place]) 01/28/2015  . Infection of pacemaker pocket ([place]) 01/28/2015  . Acute respiratory failure ([place]) 01/26/2015  . AKI (acute kidney injury) ([place]) 01/26/2015  . Acute encephalopathy 01/26/2015  . Altered mental status   . Vitamin D deficiency 01/07/2015  . Mobitz type 2 second degree atrioventricular block 12/12/2014  . Pacemaker 12/12/2014  . Symptomatic bradycardia 12/03/2014  . Lower back pain 06/12/2012  . Right hip pain 06/12/2012  . Intertrigo 05/09/2012  . Insomnia 04/19/2012  . General weakness 03/07/2012  . 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 Diarrhea 01/17/2012  . Pyelonephritis 01/15/2012  . Diabetic peripheral neuropathy ([place]) 09/28/2011  . Urinary incontinence 09/28/2011  . Preventative health care 09/23/2011  . Gout 09/23/2011  .  Bipolar affective disorder ([place]) 09/23/2011  . TIA (transient ischemic attack) 09/23/2011  . Chronic pain 09/23/2011  . DNR (do not resuscitate) 09/23/2011  . CVA (cerebral infarction) 09/23/2011  . 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  History of alcohol abuse 09/23/2011  . Benign prostatic hyperplasia   . Left ventricular systolic dysfunction   . Chronic systolic CHF (congestive heart failure) ([place]) 05/05/2011  . Edema 01/03/2011  . CAD (coronary artery disease) 05/18/2010  . Hyperlipidemia 05/18/2010  . Other abnormality of urination(788.69) 11/25/2009  . Other specified forms of chronic ischemic heart disease 05/19/2009  . CAD, ARTERY BYPASS GRAFT 11/18/2008  . AMI, INFERIOR WALL 09/30/2008  . Obstructive sleep apnea 09/16/2008  . SINUS TACHYCARDIA 09/02/2008  . DM (diabetes mellitus), type 2 with peripheral vascular complications ([place]) 98/92/1194  . OBESITY 09/01/2008  . Hypertensive heart disease with heart failure ([place]) 09/01/2008  . TOBACCO ABUSE 07/31/2007  . 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    CMP     Component Value Date/Time   NA 138 02/10/2016   K 4.3 02/10/2016   CL 102 03/20/2015 1038   CO2 28 03/20/2015 1038   GLUCOSE 171 (H) 03/20/2015 1038   BUN 25 (A) 02/10/2016   CREATININE 0.9 02/10/2016   CREATININE 1.23 03/20/2015 1038   CREATININE 1.06 12/05/2014 1536   CALCIUM 9.6 03/20/2015 1038   PROT 5.8 (L) 02/01/2015 0245   ALBUMIN 2.2 (L) 02/01/2015 0245   AST 30 11/12/2015   ALT 14 11/12/2015   ALKPHOS 29 11/12/2015   BILITOT 0.4 02/01/2015 0245   GFRNONAA 55 (L) 03/20/2015 1038   GFRAA >60 03/20/2015 1038    Recent Labs  09/24/15 11/12/15 02/10/16  NA 135* 137 138  K 4.2 4.3 4.3  BUN 23* 31* 25*  CREATININE 1.3 1.2 0.9    Recent Labs  08/10/15 09/25/15 11/12/15  AST 55* 25 30  ALT 50* 14 14  ALKPHOS 34 44 29    Recent Labs  09/24/15 09/24/15 0840 11/12/15  WBC 4.5 12.5 6.0  HGB 42.3* 14.3 14.3  HCT 94* 42 44  PLT 200 200 203     Recent Labs  04/29/15 07/29/15 08/14/15 11/12/15  CHOL 239* 165  --  172  LDLCALC  --   --  55  --   TRIG 692* 623*  --  611*   Lab Results  Component Value Date   MICROALBUR 75.6 08/13/2015   Lab Results  Component Value Date   TSH 0.92 11/12/2015   Lab Results  Component Value Date   HGBA1C 9 02/10/2016   Lab Results  Component Value Date   CHOL 172 11/12/2015   HDL 33 (A) 11/12/2015   LDLCALC 55 08/14/2015   LDLDIRECT 85.2 06/13/2012   TRIG 611 (A) 11/12/2015   CHOLHDL 7 06/13/2012    Significant Diagnostic Results in last 30 days:  No results found.  Assessment and Plan  EXPOSURE TO FLU/ INFLUENZA OUTBREAK AT SNF-   CrCl calculated by me-  65      Dose for 14 days-  75 mg daily Pt will be monitored daily for flu like symptoms                                                                                 Webb Silversmith D. Victor Coil, MD

## 2016-04-04 ENCOUNTER — Encounter: Payer: Self-pay | Admitting: Internal Medicine

## 2016-04-04 ENCOUNTER — Non-Acute Institutional Stay (SKILLED_NURSING_FACILITY): Payer: Medicare Other | Admitting: Internal Medicine

## 2016-04-04 DIAGNOSIS — I5022 Chronic systolic (congestive) heart failure: Secondary | ICD-10-CM

## 2016-04-04 DIAGNOSIS — E782 Mixed hyperlipidemia: Secondary | ICD-10-CM | POA: Diagnosis not present

## 2016-04-04 DIAGNOSIS — G218 Other secondary parkinsonism: Secondary | ICD-10-CM

## 2016-04-04 NOTE — Progress Notes (Signed)
Location:  [facility] Room Number: [phone] Place of Service:  SNF [phone])  [name], MD  Patient Care Team: [name], MD as PCP - General (Internal Medicine)  Extended Emergency Contact Information Primary Emergency Contact: [name],[name] Address: [address]          [place], [place] [address] [name] of [place] Phone: [phone] Mobile Phone: [phone] Relation: Spouse Secondary Emergency Contact: [name], Cameron [name] of [place] Phone: [phone] Mobile Phone: [phone] Relation: Daughter    Allergies: Benzedrine [amphetamine]; Dexedrine [dextroamphetamine sulfate er]; and Oxycodone  Chief Complaint  Patient presents with  . Medical Management of Chronic Issues    Routine Visit    HPI: Patient is 76 y.o. male who is being seen for routine issues of chronic CHF, Parkinsonism, and HLD.  Past Medical History:  Diagnosis Date  . 2nd degree AV block    a. s/p STJ dual chamber pacemaker 11/2014  . Benign neoplasm of colon     Adenomatous polyps  . Bipolar affective disorder ([place])    . BPH (benign prostatic hypertrophy)   . CAD (coronary artery disease)    a. s/p CABG  . Chronic pain    . CVA (cerebral infarction)     Small left occipital  . Depression    . DM        . Gout    . History of alcohol abuse    . Hyperlipidemia    . HYPERTENSION        . Ischemic cardiomyopathy    . Narcolepsy    . Obesity   . OBSTRUCTIVE SLEEP APNEA        . [facility] spotted fever   . TIA (transient ischemic attack)    . UNIVERSAL ULCERATIVE COLITIS          Past Surgical History:  Procedure Laterality Date  . CARDIAC CATHETERIZATION     Triple-vessel coronary artery disease. Low-normal left ventricular systolic function with mild   anteroapical wall motion abnormality.   [name] Kitchen CARDIAC CATHETERIZATION N/A 01/28/2015   Procedure: Temporary Pacemaker;  Surgeon: [name], MD;  Location: [facility];  Service: Cardiovascular;  Laterality: N/A;  . CORONARY ARTERY BYPASS GRAFT     Median sternotomy, extracorporeal circulation,  coronary artery bypass graft surgery x4 using a sequential left internal   mammary artery graft to the mid and distal left anterior descending, a   saphenous vein graft to diagonal branch of the left anterior descending,   and a saphenous vein graft to the obtuse marginal branch of left   circumflex coronary artery.    . EP IMPLANTABLE DEVICE N/A 12/12/2014   Procedure: Pacemaker Implant;  Surgeon: [name], MD;  Location: [facility];  Service: Cardiovascular;  Laterality: N/A;  . EP IMPLANTABLE DEVICE N/A 01/28/2015   Procedure: PPM Generator Removal;  Surgeon: [name], MD;  Location: [facility];  Service: Cardiovascular;  Laterality: N/A;  . EP IMPLANTABLE DEVICE N/A 03/20/2015   Procedure: Pacemaker Implant;  Surgeon: [name], MD;  Location: [facility];  Service: Cardiovascular;  Laterality: N/A;  . FLEXIBLE SIGMOIDOSCOPY  01/17/2012   Procedure: FLEXIBLE SIGMOIDOSCOPY;  Surgeon: Lafayette Dragon, MD;  [facility];  Service: Endoscopy;  Laterality: N/A;  . MULTIPLE EXTRACTIONS WITH ALVEOLOPLASTY  12/01/2011   Procedure: MULTIPLE EXTRACION WITH ALVEOLOPLASTY;  Surgeon: Lenn Cal, DDS;  Location: [facility];  Service: Oral Surgery;  Laterality: N/A;  Extraction of tooth #'s 3,4,5,6,7,8,9,10,11,12,13,14,15,16,17,20,21,22,23,24,25,26,27,28,29,30,31,32 with alveoloplasty and bilateral mandibular lingual tori  . TONSILECTOMY, ADENOIDECTOMY, BILATERAL MYRINGOTOMY AND TUBES  1946    Allergies as of 04/04/2016      Reactions   Benzedrine [amphetamine] Nausea Only   Dexedrine [dextroamphetamine Sulfate Er] Other (See Comments)   agitation   Oxycodone Nausea And Vomiting      Medication List       Accurate as of 04/04/16 11:59 PM. Always use your most recent med list.            acetaminophen 650 MG suppository Commonly known as:  TYLENOL Place 650 mg rectally every 6 (six) hours as needed for fever. For fever above 100F   artificial tears ointment Place 1 drop into both eyes daily as needed.   aspirin 81 MG chewable tablet Chew 81 mg by mouth at bedtime.   BIOFREEZE 4 % Gel Generic drug:  Menthol (Topical Analgesic) Apply to right elbow twice daily as needed for pain.   carbidopa-levodopa 25-100 MG tablet Commonly known as:  SINEMET IR Take 2 tablets by mouth 3 (three) times daily with meals.   carvedilol 3.125 MG tablet Commonly known as:  COREG Take 3.125 mg by mouth 2 (two) times daily with a meal. Hold for SBP less than 105   clonazePAM 0.5 MG tablet Commonly known as:  KLONOPIN Take 1 tablet (0.5 mg total) by mouth See admin instructions. Take 1 tablet (0.5 mg) by mouth daily at bedtime, may also take 1 tablet (0.5 mg) 3 times daily as needed for anxiety   docusate sodium 100 MG capsule Commonly known as:  COLACE Take 100 mg by mouth daily. Hold for > 2 loose stools in a day   donepezil 10 MG tablet Commonly known as:  ARICEPT Take 10 mg by mouth at bedtime.   doxazosin 1 MG tablet Commonly known as:  CARDURA Take 1 mg by mouth at bedtime.   fenofibrate 145 MG tablet Commonly known as:  TRICOR Take 145 mg by mouth daily. With a meal   Fish Oil 1000 MG Caps Take 2 capsules by mouth 2 (two) times daily.   furosemide 20 MG tablet Commonly known as:  LASIX Take 20 mg by mouth.   gabapentin 300 MG capsule Commonly known as:  NEURONTIN 300 mg 3 (three) times daily.   HUMALOG KWIKPEN 100 UNIT/ML KiwkPen Generic drug:  insulin lispro Inject 5 Units into the skin 3 (three) times daily. Hold for CBG  < 200. ( to be given in addition to sliding scale) Check FSBS before meals and at bedtime. SSI 201-25 4 units, 251-300 6 units, 301-350 8 units, 351-400 10 units   ketoconazole 2 % cream Commonly known as:  NIZORAL Apply 1 application  topically daily. Apply to face/neck as needed   lamoTRIgine 100 MG tablet Commonly known as:  LAMICTAL Take 100 mg by mouth 2 (two) times daily.   LEVEMIR FLEXTOUCH 100 UNIT/ML Pen Generic drug:  Insulin Detemir Inject 40 Units into the skin daily.   magnesium oxide 400 MG tablet Commonly known as:  MAG-OX Take 400 mg by mouth daily.   metFORMIN 1000 MG tablet Commonly known as:  GLUCOPHAGE 1,000 mg 2 (two) times daily.   nitroGLYCERIN 0.4 MG SL tablet Commonly known as:  NITROSTAT Place 0.4 mg under the tongue every 5 (five) minutes as needed for chest pain.  OLANZapine 2.5 MG tablet Commonly known as:  ZYPREXA Take 2.5 mg by mouth at bedtime.   OXYGEN Inhale 2 L into the lungs continuous. To keep spo2 above 90%   polyethylene glycol packet Commonly known as:  MIRALAX / GLYCOLAX Take 17 g by mouth daily.   pravastatin 40 MG tablet Commonly known as:  PRAVACHOL Take 40 mg by mouth at bedtime.   ROBAFEN 100 MG/5ML syrup Generic drug:  guaifenesin Take 100 mg by mouth every 8 (eight) hours as needed for cough.   traMADol 50 MG tablet Commonly known as:  ULTRAM Take 50 mg by mouth. Every shift for pain   traZODone 50 MG tablet Commonly known as:  DESYREL Take 50 mg by mouth at bedtime.       No orders of the defined types were placed in this encounter.   There is no immunization history for the selected administration types on file for this patient.  Social History  Substance Use Topics  . Smoking status: Former Smoker    Quit date: 08/18/2014  . Smokeless tobacco: Former Systems developer  . Alcohol use No     Comment: quit 2003    Review of Systems  DATA OBTAINED: from patient- pt can not participate much nurse- no concerns GENERAL:  no fevers, fatigue, appetite changes SKIN: No itching, rash HEENT: No complaint RESPIRATORY: No cough, wheezing, SOB CARDIAC: No chest pain, palpitations, lower extremity edema  GI: No abdominal pain, No N/V/D or constipation,  No heartburn or reflux  GU: No dysuria, frequency or urgency, or incontinence  MUSCULOSKELETAL: No unrelieved bone/joint pain NEUROLOGIC: No headache, dizziness  PSYCHIATRIC: No overt anxiety or sadness  Vitals:   04/04/16 0956  BP: 114/74  Pulse: 84  Resp: 18  Temp: 97.5 F (36.4 C)   Body mass index is 32.72 kg/m. Physical Exam  GENERAL APPEARANCE: Alert, min conversant, No acute distress  SKIN: No diaphoresis rash HEENT: Unremarkable RESPIRATORY: Breathing is even, unlabored. Lung sounds are clear   CARDIOVASCULAR: Heart RRR no murmurs, rubs or gallops. No peripheral edema  GASTROINTESTINAL: Abdomen is soft, non-tender, not distended w/ normal bowel sounds.  GENITOURINARY: Bladder non tender, not distended  MUSCULOSKELETAL: No abnormal joints or musculature NEUROLOGIC: Cranial nerves 2-12 grossly intact. Moves all extremities stiffly PSYCHIATRIC: Mood and affect odd, with dementiano behavioral issues  Patient Active Problem List   Diagnosis Date Noted  . Aspiration pneumonia ([place]) 09/30/2015  . Parkinsonism ([place]) 09/30/2015  . Tremor 08/11/2015  . Suicidal ideation 05/10/2015  . Bipolar depression ([place]) 05/10/2015  . Complete heart block ([place]) 03/20/2015  . Bradycardia 03/14/2015  . Dementia with behavioral disturbance 02/15/2015  . Staphylococcus aureus bacteremia with sepsis ([place]) 01/28/2015  . Infection of pacemaker pocket ([place]) 01/28/2015  . Acute respiratory failure ([place]) 01/26/2015  . AKI (acute kidney injury) ([place]) 01/26/2015  . Acute encephalopathy 01/26/2015  . Altered mental status   . Vitamin D deficiency 01/07/2015  . Mobitz type 2 second degree atrioventricular block 12/12/2014  . Pacemaker 12/12/2014  . Symptomatic bradycardia 12/03/2014  . Lower back pain 06/12/2012  . Right hip pain 06/12/2012  . Intertrigo 05/09/2012  . Insomnia 04/19/2012  . General weakness 03/07/2012  . 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 Diarrhea  01/17/2012  . Pyelonephritis 01/15/2012  . Diabetic peripheral neuropathy ([place]) 09/28/2011  . Urinary incontinence 09/28/2011  . Preventative health care 09/23/2011  . Gout 09/23/2011  . Bipolar affective disorder ([place]) 09/23/2011  . TIA (transient ischemic attack) 09/23/2011  .  Chronic pain 09/23/2011  . DNR (do not resuscitate) 09/23/2011  . CVA (cerebral infarction) 09/23/2011  . 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 History of alcohol abuse 09/23/2011  . Benign prostatic hyperplasia   . Left ventricular systolic dysfunction   . Chronic systolic CHF (congestive heart failure) ([place]) 05/05/2011  . Edema 01/03/2011  . CAD (coronary artery disease) 05/18/2010  . Hyperlipidemia 05/18/2010  . Other abnormality of urination(788.69) 11/25/2009  . Other specified forms of chronic ischemic heart disease 05/19/2009  . CAD, ARTERY BYPASS GRAFT 11/18/2008  . AMI, INFERIOR WALL 09/30/2008  . Obstructive sleep apnea 09/16/2008  . SINUS TACHYCARDIA 09/02/2008  . DM (diabetes mellitus), type 2 with peripheral vascular complications ([place]) 01/09/8249  . OBESITY 09/01/2008  . Hypertensive heart disease with heart failure ([place]) 09/01/2008  . TOBACCO ABUSE 07/31/2007  . 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[place]) 07/31/2007    CMP     Component Value Date/Time   NA 138 02/10/2016   K 4.3 02/10/2016   CL 102 03/20/2015 1038   CO2 28 03/20/2015 1038   GLUCOSE 171 (H) 03/20/2015 1038   BUN 25 (A) 02/10/2016   CREATININE 0.9 02/10/2016   CREATININE 1.23 03/20/2015 1038   CREATININE 1.06 12/05/2014 1536   CALCIUM 9.6 03/20/2015 1038   PROT 5.8 (L) 02/01/2015 0245   ALBUMIN 2.2 (L) 02/01/2015 0245   AST 30 11/12/2015   ALT 14 11/12/2015   ALKPHOS 29 11/12/2015   BILITOT 0.4 02/01/2015 0245   GFRNONAA 55 (L) 03/20/2015 1038   GFRAA >60 03/20/2015 1038    Recent Labs  09/24/15 11/12/15 02/10/16  NA 135* 137 138  K 4.2 4.3 4.3  BUN 23* 31* 25*  CREATININE 1.3 1.2 0.9    Recent Labs  08/10/15 09/25/15  11/12/15  AST 55* 25 30  ALT 50* 14 14  ALKPHOS 34 44 29    Recent Labs  09/24/15 09/24/15 0840 11/12/15  WBC 4.5 12.5 6.0  HGB 42.3* 14.3 14.3  HCT 94* 42 44  PLT 200 200 203    Recent Labs  04/29/15 07/29/15 08/14/15 11/12/15  CHOL 239* 165  --  172  LDLCALC  --   --  55  --   TRIG 692* 623*  --  611*   Lab Results  Component Value Date   MICROALBUR 75.6 08/13/2015   Lab Results  Component Value Date   TSH 0.92 11/12/2015   Lab Results  Component Value Date   HGBA1C 9 02/10/2016   Lab Results  Component Value Date   CHOL 172 11/12/2015   HDL 33 (A) 11/12/2015   LDLCALC 55 08/14/2015   LDLDIRECT 85.2 06/13/2012   TRIG 611 (A) 11/12/2015   CHOLHDL 7 06/13/2012    Significant Diagnostic Results in last 30 days:  No results found.  Assessment and Plan  Chronic systolic CHF (congestive heart failure) (HCC) Chronic and stable, no recent exacerbation despite fact that pt and family are non compliant;plan to cont lasix 20mg  daily and coreg 3.125 mg BID  Parkinsonism (HCC) Chronic and stable on sinemet 25-100 mg TID; tremors cont improved  Hyperlipidemia HDL 33, TG 622, ; poor control as long as pt is noncompliant;plan to cont tricor 145 mg daily, pravachol 40 mg daily and omega 3 1000 mg BID    [name], MD

## 2016-04-06 LAB — HM DIABETES FOOT EXAM

## 2016-04-09 ENCOUNTER — Encounter: Payer: Self-pay | Admitting: Internal Medicine

## 2016-04-09 LAB — MICROALBUMIN, URINE

## 2016-04-12 ENCOUNTER — Other Ambulatory Visit: Payer: Self-pay | Admitting: *Deleted

## 2016-04-12 MED ORDER — CLONAZEPAM 0.5 MG PO TABS: ORAL_TABLET | ORAL | 0 refills | Status: DC

## 2016-04-12 NOTE — Telephone Encounter (Signed)
[facility] #[phone] Fax: [phone]  

## 2016-04-16 ENCOUNTER — Encounter: Payer: Self-pay | Admitting: Internal Medicine

## 2016-04-16 NOTE — Assessment & Plan Note (Signed)
HDL 33, TG 622, ; poor control as long as pt is noncompliant;plan to cont tricor 145 mg daily, pravachol 40 mg daily and omega 3 1000 mg BID

## 2016-04-16 NOTE — Assessment & Plan Note (Signed)
Chronic and stable on sinemet 25-100 mg TID; tremors cont improved

## 2016-04-16 NOTE — Assessment & Plan Note (Signed)
Chronic and stable, no recent exacerbation despite fact that pt and family are non compliant;plan to cont lasix 20mg  daily and coreg 3.125 mg BID

## 2016-04-19 ENCOUNTER — Ambulatory Visit (INDEPENDENT_AMBULATORY_CARE_PROVIDER_SITE_OTHER): Payer: Medicare Other | Admitting: Internal Medicine

## 2016-04-19 ENCOUNTER — Encounter: Payer: Self-pay | Admitting: Internal Medicine

## 2016-04-19 VITALS — BP 112/72 | HR 64 | Ht 68.0 in

## 2016-04-19 DIAGNOSIS — Z950 Presence of cardiac pacemaker: Secondary | ICD-10-CM

## 2016-04-19 DIAGNOSIS — I442 Atrioventricular block, complete: Secondary | ICD-10-CM

## 2016-04-19 NOTE — Progress Notes (Signed)
HPI [name] returns today for followup. He is a pleasant 76 yo man with a h/o complete heart block who underwent PPM insertion and had this complicated by a PPM infection with MRSA. He has undergone device removal and insertion of a temporary perm PM, a full course of IV anti-biotics and re-implantation of a new PPM on the contralateral side. He has done well in the interim. No chest pain or sob. He has Parkinson's disease and requires full time care in a SNF. He is not too happy about this. Allergies  Allergen Reactions  . Benzedrine [Amphetamine] Nausea Only  . Dexedrine [Dextroamphetamine Sulfate Er] Other (See Comments)    agitation  . Oxycodone Nausea And Vomiting  . Vancomycin     Unknown - per [facility]     Current Outpatient Prescriptions  Medication Sig Dispense Refill  . acetaminophen (TYLENOL) 650 MG suppository Place 650 mg rectally every 6 (six) hours as needed for fever. For fever above 100F    . Artificial Tear Ointment (ARTIFICIAL TEARS) ointment Place 1 drop into both eyes daily as needed (Use as directed).     [name] Kitchen aspirin 81 MG chewable tablet Chew 81 mg by mouth at bedtime.     . carbidopa-levodopa (SINEMET IR) 25-100 MG tablet Take 2 tablets by mouth 3 (three) times daily with meals.     . carvedilol (COREG) 3.125 MG tablet Take 3.125 mg by mouth 2 (two) times daily with a meal. Hold for SBP less than 105     . clonazePAM (KLONOPIN) 0.5 MG tablet Take one tablet by mouth three times daily as needed for anxiety 90 tablet 0  . docusate sodium (COLACE) 100 MG capsule Take 100 mg by mouth daily. Hold for > 2 loose stools in a day     . donepezil (ARICEPT) 10 MG tablet Take 10 mg by mouth at bedtime.    [name] Kitchen doxazosin (CARDURA) 1 MG tablet Take 1 mg by mouth at bedtime.     . fenofibrate (TRICOR) 145 MG tablet Take 145 mg by mouth daily. With a meal    . furosemide (LASIX) 20 MG tablet Take 20 mg by mouth daily.     [name] Kitchen gabapentin (NEURONTIN) 300 MG capsule Take 300 mg by  mouth 3 (three) times daily.     [name] Kitchen guaifenesin (ROBAFEN) 100 MG/5ML syrup Take 100 mg by mouth every 8 (eight) hours as needed for cough.    . insulin lispro (HUMALOG KWIKPEN) 100 UNIT/ML KiwkPen Inject 5 Units into the skin 3 (three) times daily. Hold for CBG  < 200. ( to be given in addition to sliding scale) Check FSBS before meals and at bedtime. SSI 201-25 4 units, 251-300 6 units, 301-350 8 units, 351-400 10 units    . ketoconazole (NIZORAL) 2 % cream Apply 1 application topically as directed. Apply to face/neck as needed     . lamoTRIgine (LAMICTAL) 100 MG tablet Take 100 mg by mouth 2 (two) times daily.     [name] Kitchen LEVEMIR FLEXTOUCH 100 UNIT/ML Pen Inject 40 Units into the skin daily.     . magnesium oxide (MAG-OX) 400 MG tablet Take 400 mg by mouth daily.     . Menthol, Topical Analgesic, (BIOFREEZE) 4 % GEL as directed. Apply to right elbow twice daily as needed for pain.     . metFORMIN (GLUCOPHAGE) 1000 MG tablet Take 1,000 mg by mouth 2 (two) times daily.     . nitroGLYCERIN (NITROSTAT) 0.4 MG  SL tablet Place 0.4 mg under the tongue every 5 (five) minutes as needed for chest pain.    [name] Kitchen OLANZapine (ZYPREXA) 2.5 MG tablet Take 2.5 mg by mouth at bedtime.    . Omega-3 Fatty Acids (FISH OIL) 1000 MG CAPS Take 2 capsules by mouth 2 (two) times daily.     . OXYGEN Inhale 2 L into the lungs continuous. To keep spo2 above 90%    . polyethylene glycol (MIRALAX / GLYCOLAX) packet Take 17 g by mouth daily.     . pravastatin (PRAVACHOL) 40 MG tablet Take 40 mg by mouth at bedtime.     . traMADol (ULTRAM) 50 MG tablet Take 50 mg by mouth as directed. Every shift for pain    . traZODone (DESYREL) 50 MG tablet Take 50 mg by mouth at bedtime.    . Vitamin D, Ergocalciferol, (DRISDOL) 50000 units CAPS capsule Take 50,000 Units by mouth every 30 (thirty) days.     No current facility-administered medications for this visit.      Past Medical History:  Diagnosis Date  . 2nd degree AV block    a. s/p  STJ dual chamber pacemaker 11/2014  . Benign neoplasm of colon     Adenomatous polyps  . Bipolar affective disorder ([place])    . BPH (benign prostatic hypertrophy)   . CAD (coronary artery disease)    a. s/p CABG  . Chronic pain    . CVA (cerebral infarction)     Small left occipital  . Depression    . DM        . Gout    . History of alcohol abuse    . Hyperlipidemia    . HYPERTENSION        . Ischemic cardiomyopathy    . Narcolepsy    . Obesity   . OBSTRUCTIVE SLEEP APNEA        . [facility] spotted fever   . TIA (transient ischemic attack)    . UNIVERSAL ULCERATIVE COLITIS          ROS:   All systems reviewed and negative except as noted in the HPI.   Past Surgical History:  Procedure Laterality Date  . CARDIAC CATHETERIZATION     Triple-vessel coronary artery disease. Low-normal left ventricular systolic function with mild   anteroapical wall motion abnormality.   [name] Kitchen CARDIAC CATHETERIZATION N/A 01/28/2015   Procedure: Temporary Pacemaker;  Surgeon: [name], MD;  Location: [facility];  Service: Cardiovascular;  Laterality: N/A;  . CORONARY ARTERY BYPASS GRAFT     Median sternotomy, extracorporeal circulation,  coronary artery bypass graft surgery x4 using a sequential left internal   mammary artery graft to the mid and distal left anterior descending, a   saphenous vein graft to diagonal branch of the left anterior descending,   and a saphenous vein graft to the obtuse marginal branch of left   circumflex coronary artery.    . EP IMPLANTABLE DEVICE N/A 12/12/2014   Procedure: Pacemaker Implant;  Surgeon: [name], MD;  Location: [facility];  Service: Cardiovascular;  Laterality: N/A;  . EP IMPLANTABLE DEVICE N/A 01/28/2015   Procedure: PPM Generator Removal;  Surgeon: [name], MD;  Location: [facility];  Service: Cardiovascular;  Laterality: N/A;  . EP IMPLANTABLE DEVICE N/A 03/20/2015   Procedure: Pacemaker Implant;  Surgeon:  [name], MD;  Location: [facility];  Service: Cardiovascular;  Laterality: N/A;  .  FLEXIBLE SIGMOIDOSCOPY  01/17/2012   Procedure: FLEXIBLE SIGMOIDOSCOPY;  Surgeon: Lafayette Dragon, MD;  [facility];  Service: Endoscopy;  Laterality: N/A;  . MULTIPLE EXTRACTIONS WITH ALVEOLOPLASTY  12/01/2011   Procedure: MULTIPLE EXTRACION WITH ALVEOLOPLASTY;  Surgeon: Lenn Cal, DDS;  Location: [facility];  Service: Oral Surgery;  Laterality: N/A;  Extraction of tooth #'s 3,4,5,6,7,8,9,10,11,12,13,14,15,16,17,20,21,22,23,24,25,26,27,28,29,30,31,32 with alveoloplasty and bilateral mandibular lingual tori  . TONSILECTOMY, ADENOIDECTOMY, BILATERAL MYRINGOTOMY AND TUBES  1946     Family History  Problem Relation Age of Onset  . Diabetes Father   . Heart disease Father   . Stroke Father   . Heart attack Mother   . Aortic aneurysm Mother   . Alcohol abuse Other   . Arthritis Other   . Heart disease Other   . Mental illness Other   . Diabetes Other   . Alcohol abuse Other   . Arthritis Other   . Hyperlipidemia Other   . Heart disease Other   . Stroke Other   . Hypertension Other   . Diabetes Other   . Mental illness Other   . Heart disease Brother      Social History   Social History  . Marital status: Married    Spouse name: [name]  . Number of children: 2  . Years of education: 12   Occupational History  . retired Retired    Ship broker   Social History Main Topics  . Smoking status: Former Smoker    Quit date: 08/18/2014  . Smokeless tobacco: Former Systems developer  . Alcohol use No     Comment: quit 2003  . Drug use: No  . Sexual activity: No   Other Topics Concern  . Not on file   Social History Narrative   Patient has been married for 51 years.   Patient presents with his wife today.    Patient has 2 grown children and lives in [place] areas [place].   Patient is a nonsmoker, nondrinker at this time.   09/11/15 lives at [facility]  and [place] since last year   Caffeine- 1 soda daily     BP 112/72   Pulse 64   Ht 5\' 8"  (1.727 m)   SpO2 91%   Physical Exam:  stable appearing elderly man, NAD HEENT: Unremarkable Neck:  6 cm JVD, no thyromegally Lymphatics:  No adenopathy Back:  No CVA tenderness Lungs:  Clear with no wheezes HEART:  Regular rate rhythm, no murmurs, no rubs, no clicks Abd:  soft, obese, positive bowel sounds, no organomegally, no rebound, no guarding Ext:  2 plus pulses, no edema, no cyanosis, no clubbing Skin:  No rashes no nodules Neuro:  CN II through XII intact, motor grossly intact   DEVICE - normal device function  Assess/Plan: 1. PPM - His [employer] Jude DDD PM is working normally  2. Complete heart block - he is s/p insertion of a new PPM. He is asymptomatic. 3. Obesity - he has lost weight. 4. HTN - his blood pressure is well controlled. No change in meds.   Mikle Bosworth.D.

## 2016-04-19 NOTE — Patient Instructions (Signed)
Medication Instructions:  Your physician recommends that you continue on your current medications as directed. Please refer to the Current Medication list given to you today.   Labwork: none  Testing/Procedures: none  Follow-Up: Remote monitoring is used to monitor your Pacemaker of ICD from home. This monitoring reduces the number of office visits required to check your device to one time per year. It allows [place] to keep an eye on the functioning of your device to ensure it is working properly. You are scheduled for a device check from home on 07/19/2016. You may send your transmission at any time that day. If you have a wireless device, the transmission will be sent automatically. After your physician reviews your transmission, you will receive a postcard with your next transmission date.  Your physician wants you to follow-up in: 12 months with Dr. Lovena Le. You will receive a reminder letter in the mail two months in advance. If you don't receive a letter, please call our office to schedule the follow-up appointment.   Any Other Special Instructions Will Be Listed Below (If Applicable).     If you need a refill on your cardiac medications before your next appointment, please call your pharmacy.

## 2016-05-04 ENCOUNTER — Non-Acute Institutional Stay (SKILLED_NURSING_FACILITY): Payer: Medicare Other | Admitting: Internal Medicine

## 2016-05-04 ENCOUNTER — Encounter: Payer: Self-pay | Admitting: Internal Medicine

## 2016-05-04 DIAGNOSIS — F0281 Dementia in other diseases classified elsewhere with behavioral disturbance: Secondary | ICD-10-CM | POA: Diagnosis not present

## 2016-05-04 DIAGNOSIS — E1142 Type 2 diabetes mellitus with diabetic polyneuropathy: Secondary | ICD-10-CM | POA: Diagnosis not present

## 2016-05-04 DIAGNOSIS — E1151 Type 2 diabetes mellitus with diabetic peripheral angiopathy without gangrene: Secondary | ICD-10-CM

## 2016-05-04 DIAGNOSIS — G300 Alzheimer's disease with early onset: Secondary | ICD-10-CM | POA: Diagnosis not present

## 2016-05-04 NOTE — Progress Notes (Signed)
Location:  [facility] Room Number: [phone] Place of Service:  SNF (31)  Victor Duos, MD  Patient Care Team: Victor Duos, MD as PCP - General (Internal Medicine)  Extended Emergency Contact Information Primary Emergency Contact: [name],[name] Address: [address]          [place], [place] [address] [name] of [place] Phone: [phone] Mobile Phone: [phone] Relation: Spouse Secondary Emergency Contact: [name], [name] of [place] Phone: [phone] Mobile Phone: [phone] Relation: Daughter    Allergies: Benzedrine [amphetamine]; Dexedrine [dextroamphetamine sulfate er]; Oxycodone; and Vancomycin  Chief Complaint  Patient presents with  . Medical Management of Chronic Issues    Routine Visit    HPI: Patient is 76 y.o. male who is being seen for routine issue sof diabetic neuropathy, DM2 and dementia.   Past Medical History:  Diagnosis Date  . 2nd degree AV block    a. s/p STJ dual chamber pacemaker 11/2014  . Benign neoplasm of colon     Adenomatous polyps  . Bipolar affective disorder ([place])    . BPH (benign prostatic hypertrophy)   . CAD (coronary artery disease)    a. s/p CABG  . Chronic pain    . CVA (cerebral infarction)     Small left occipital  . Depression    . DM        . Gout    . History of alcohol abuse    . Hyperlipidemia    . HYPERTENSION        . Ischemic cardiomyopathy    . Narcolepsy    . Obesity   . OBSTRUCTIVE SLEEP APNEA        . [facility] spotted fever   . TIA (transient ischemic attack)    . UNIVERSAL ULCERATIVE COLITIS          Past Surgical History:  Procedure Laterality Date  . CARDIAC CATHETERIZATION     Triple-vessel coronary artery disease. Low-normal left ventricular systolic function with mild   anteroapical wall motion abnormality.   [name] Kitchen CARDIAC CATHETERIZATION N/A 01/28/2015   Procedure: Temporary Pacemaker;   Surgeon: [name], MD;  Location: [place] [facility] CV LAB;  Service: Cardiovascular;  Laterality: N/A;  . CORONARY ARTERY BYPASS GRAFT     Median sternotomy, extracorporeal circulation,  coronary artery bypass graft surgery x4 using a sequential left internal   mammary artery graft to the mid and distal left anterior descending, a   saphenous vein graft to diagonal branch of the left anterior descending,   and a saphenous vein graft to the obtuse marginal branch of left   circumflex coronary artery.    . EP IMPLANTABLE DEVICE N/A 12/12/2014   Procedure: Pacemaker Implant;  Surgeon: [name], MD;  Location: [place] [facility] LAB;  Service: Cardiovascular;  Laterality: N/A;  . EP IMPLANTABLE DEVICE N/A 01/28/2015   Procedure: PPM Generator Removal;  Surgeon: [name], MD;  Location: [place] [facility];  Service: Cardiovascular;  Laterality: N/A;  . EP IMPLANTABLE DEVICE N/A 03/20/2015   Procedure: Pacemaker Implant;  Surgeon: [name], MD;  Location: [place] [facility];  Service: Cardiovascular;  Laterality: N/A;  . FLEXIBLE SIGMOIDOSCOPY  01/17/2012   Procedure: FLEXIBLE SIGMOIDOSCOPY;  Surgeon: Lafayette Dragon, MD;  Location: [facility];  Service: Endoscopy;  Laterality: N/A;  . MULTIPLE EXTRACTIONS WITH ALVEOLOPLASTY  12/01/2011   Procedure: MULTIPLE EXTRACION  WITH ALVEOLOPLASTY;  Surgeon: Lenn Cal, DDS;  Location: [facility];  Service: Oral Surgery;  Laterality: N/A;  Extraction of tooth #'s 3,4,5,6,7,8,9,10,11,12,13,14,15,16,17,20,21,22,23,24,25,26,27,28,29,30,31,32 with alveoloplasty and bilateral mandibular lingual tori  . TONSILECTOMY, ADENOIDECTOMY, BILATERAL MYRINGOTOMY AND TUBES  1946    Allergies as of 05/04/2016      Reactions   Benzedrine [amphetamine] Nausea Only   Dexedrine [dextroamphetamine Sulfate Er] Other (See Comments)   agitation   Oxycodone Nausea And Vomiting   Vancomycin    Unknown - per [facility]      Medication List       Accurate as of 05/04/16 11:59  PM. Always use your most recent med list.          acetaminophen 650 MG suppository Commonly known as:  TYLENOL Place 650 mg rectally every 6 (six) hours as needed for fever. For fever above 100F   artificial tears ointment Place 1 drop into both eyes daily as needed (Use as directed).   aspirin 81 MG chewable tablet Chew 81 mg by mouth at bedtime.   BIOFREEZE 4 % Gel Generic drug:  Menthol (Topical Analgesic) as directed. Apply to right elbow twice daily as needed for pain.   carbidopa-levodopa 25-100 MG tablet Commonly known as:  SINEMET IR Take 2 tablets by mouth 3 (three) times daily with meals.   carvedilol 3.125 MG tablet Commonly known as:  COREG Take 3.125 mg by mouth 2 (two) times daily with a meal. Hold for SBP less than 105   clonazePAM 0.5 MG tablet Commonly known as:  KLONOPIN Take one tablet by mouth three times daily as needed for anxiety   docusate sodium 100 MG capsule Commonly known as:  COLACE Take 100 mg by mouth daily. Hold for > 2 loose stools in a day   donepezil 10 MG tablet Commonly known as:  ARICEPT Take 10 mg by mouth at bedtime.   doxazosin 1 MG tablet Commonly known as:  CARDURA Take 1 mg by mouth at bedtime.   fenofibrate 145 MG tablet Commonly known as:  TRICOR Take 145 mg by mouth daily. With a meal   Fish Oil 1000 MG Caps Take 2 capsules by mouth 2 (two) times daily.   furosemide 20 MG tablet Commonly known as:  LASIX Take 20 mg by mouth daily.   gabapentin 300 MG capsule Commonly known as:  NEURONTIN Take 300 mg by mouth 3 (three) times daily.   HUMALOG KWIKPEN 100 UNIT/ML KiwkPen Generic drug:  insulin lispro Inject 5 Units into the skin 3 (three) times daily. Hold for CBG  < 200. ( to be given in addition to sliding scale) Check FSBS before meals and at bedtime. SSI 201-25 4 units, 251-300 6 units, 301-350 8 units, 351-400 10 units   ketoconazole 2 % cream Commonly known as:  NIZORAL Apply 1 application topically as  directed. Apply to face/neck as needed   lamoTRIgine 100 MG tablet Commonly known as:  LAMICTAL Take 100 mg by mouth 2 (two) times daily.   LEVEMIR FLEXTOUCH 100 UNIT/ML Pen Generic drug:  Insulin Detemir Inject 40 Units into the skin daily.   magnesium oxide 400 MG tablet Commonly known as:  MAG-OX Take 400 mg by mouth daily.   metFORMIN 1000 MG tablet Commonly known as:  GLUCOPHAGE Take 1,000 mg by mouth 2 (two) times daily.   nitroGLYCERIN 0.4 MG SL tablet Commonly known as:  NITROSTAT Place 0.4 mg under the tongue every 5 (five) minutes as needed for chest pain.  OLANZapine 2.5 MG tablet Commonly known as:  ZYPREXA Take 2.5 mg by mouth at bedtime.   OXYGEN Inhale 2 L into the lungs continuous. To keep spo2 above 90%   polyethylene glycol packet Commonly known as:  MIRALAX / GLYCOLAX Take 17 g by mouth daily.   pravastatin 40 MG tablet Commonly known as:  PRAVACHOL Take 40 mg by mouth at bedtime.   ROBAFEN 100 MG/5ML syrup Generic drug:  guaifenesin Take 100 mg by mouth every 8 (eight) hours as needed for cough.   traMADol 50 MG tablet Commonly known as:  ULTRAM Take 50 mg by mouth as directed. Every shift for pain   traZODone 50 MG tablet Commonly known as:  DESYREL Take 50 mg by mouth at bedtime.   Vitamin D (Ergocalciferol) 50000 units Caps capsule Commonly known as:  DRISDOL Take 50,000 Units by mouth every 30 (thirty) days.       No orders of the defined types were placed in this encounter.   There is no immunization history for the selected administration types on file for this patient.  Social History  Substance Use Topics  . Smoking status: Former Smoker    Quit date: 08/18/2014  . Smokeless tobacco: Former Systems developer  . Alcohol use No     Comment: quit 2003    Review of Systems  DATA OBTAINED: from patient- limited participation; nursing - no new concerns GENERAL:  no fevers, fatigue, appetite changes SKIN: No itching, rash HEENT: No  complaint RESPIRATORY: No cough, wheezing, SOB CARDIAC: No chest pain, palpitations, lower extremity edema  GI: No abdominal pain, No N/V/D or constipation, No heartburn or reflux  GU: No dysuria, frequency or urgency, or incontinence  MUSCULOSKELETAL: No unrelieved bone/joint pain NEUROLOGIC: No headache, dizziness  PSYCHIATRIC: No overt anxiety or sadness  Vitals:   05/04/16 1003  BP: 124/79  Pulse: 69  Resp: 18  Temp: 98.4 F (36.9 C)   Body mass index is 31.99 kg/m. Physical Exam  GENERAL APPEARANCE: Alert, conversant, No acute distress, obese WM  SKIN: No diaphoresis rash HEENT: Unremarkable RESPIRATORY: Breathing is even, unlabored. Lung sounds are clear   CARDIOVASCULAR: Heart RRR no murmurs, rubs or gallops. No peripheral edema  GASTROINTESTINAL: Abdomen is soft, non-tender, not distended w/ normal bowel sounds.  GENITOURINARY: Bladder non tender, not distended  MUSCULOSKELETAL: No abnormal joints or musculature NEUROLOGIC: Cranial nerves 2-12 grossly intact. Moves all extremities PSYCHIATRIC: Mood and affect grumpy with dementia, no behavioral issues  Patient Active Problem List   Diagnosis Date Noted  . Aspiration pneumonia ([place]) 09/30/2015  . Parkinsonism ([place]) 09/30/2015  . Tremor 08/11/2015  . Suicidal ideation 05/10/2015  . Bipolar depression ([place]) 05/10/2015  . Complete heart block ([place]) 03/20/2015  . Bradycardia 03/14/2015  . Dementia with behavioral disturbance 02/15/2015  . Staphylococcus aureus bacteremia with sepsis ([place]) 01/28/2015  . Infection of pacemaker pocket ([place]) 01/28/2015  . Acute respiratory failure ([place]) 01/26/2015  . AKI (acute kidney injury) ([place]) 01/26/2015  . Acute encephalopathy 01/26/2015  . Altered mental status   . Vitamin D deficiency 01/07/2015  . Mobitz type 2 second degree atrioventricular block 12/12/2014  . Pacemaker 12/12/2014  . Symptomatic bradycardia 12/03/2014  . Lower back pain 06/12/2012  . Right hip pain  06/12/2012  . Intertrigo 05/09/2012  . Insomnia 04/19/2012  . General weakness 03/07/2012  . 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 Diarrhea 01/17/2012  . Pyelonephritis 01/15/2012  . Diabetic peripheral neuropathy ([place]) 09/28/2011  . Urinary incontinence 09/28/2011  .  Preventative health care 09/23/2011  . Gout 09/23/2011  . Bipolar affective disorder ([place]) 09/23/2011  . TIA (transient ischemic attack) 09/23/2011  . Chronic pain 09/23/2011  . DNR (do not resuscitate) 09/23/2011  . CVA (cerebral infarction) 09/23/2011  . 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 History of alcohol abuse 09/23/2011  . Benign prostatic hyperplasia   . Left ventricular systolic dysfunction   . Chronic systolic CHF (congestive heart failure) ([place]) 05/05/2011  . Edema 01/03/2011  . CAD (coronary artery disease) 05/18/2010  . Hyperlipidemia 05/18/2010  . Other abnormality of urination(788.69) 11/25/2009  . Other specified forms of chronic ischemic heart disease 05/19/2009  . CAD, ARTERY BYPASS GRAFT 11/18/2008  . AMI, INFERIOR WALL 09/30/2008  . Obstructive sleep apnea 09/16/2008  . SINUS TACHYCARDIA 09/02/2008  . DM (diabetes mellitus), type 2 with peripheral vascular complications ([place]) 36/64/4034  . OBESITY 09/01/2008  . Hypertensive heart disease with heart failure ([place]) 09/01/2008  . TOBACCO ABUSE 07/31/2007  . 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    CMP     Component Value Date/Time   NA 138 02/10/2016   K 4.3 02/10/2016   CL 102 03/20/2015 1038   CO2 28 03/20/2015 1038   GLUCOSE 171 (H) 03/20/2015 1038   BUN 25 (A) 02/10/2016   CREATININE 0.9 02/10/2016   CREATININE 1.23 03/20/2015 1038   CREATININE 1.06 12/05/2014 1536   CALCIUM 9.6 03/20/2015 1038   PROT 5.8 (L) 02/01/2015 0245   ALBUMIN 2.2 (L) 02/01/2015 0245   AST 30 11/12/2015   ALT 14 11/12/2015   ALKPHOS 29 11/12/2015   BILITOT 0.4 02/01/2015 0245   GFRNONAA 55 (L) 03/20/2015 1038   GFRAA >60 03/20/2015  1038    Recent Labs  09/24/15 11/12/15 02/10/16  NA 135* 137 138  K 4.2 4.3 4.3  BUN 23* 31* 25*  CREATININE 1.3 1.2 0.9    Recent Labs  08/10/15 09/25/15 11/12/15  AST 55* 25 30  ALT 50* 14 14  ALKPHOS 34 44 29    Recent Labs  09/24/15 09/24/15 0840 11/12/15  WBC 4.5 12.5 6.0  HGB 42.3* 14.3 14.3  HCT 94* 42 44  PLT 200 200 203    Recent Labs  07/29/15 08/14/15 11/12/15  CHOL 165  --  172  LDLCALC  --  55  --   TRIG 623*  --  611*   Lab Results  Component Value Date   MICROALBUR >42.6 04/09/2016   Lab Results  Component Value Date   TSH 0.92 11/12/2015   Lab Results  Component Value Date   HGBA1C 9 02/10/2016   Lab Results  Component Value Date   CHOL 172 11/12/2015   HDL 33 (A) 11/12/2015   LDLCALC 55 08/14/2015   LDLDIRECT 85.2 06/13/2012   TRIG 611 (A) 11/12/2015   CHOLHDL 7 06/13/2012    Significant Diagnostic Results in last 30 days:  No results found.  Assessment and Plan  Diabetic peripheral neuropathy (HCC) No reports of pain;plan to cont neurontin 300 mg TID  DM (diabetes mellitus), type 2 with peripheral vascular complications (HCC) Last A1c was 9 which is an improvement; both pt and family are non compliant with diet; plan to cont glucophage 1000 mg BID, levemir 40 u daily and 5u reg with meals  Dementia with behavioral disturbance Stable, no major declines; depressibe bipolar;plan to cont qricept 10 mg daily    Zackry Deines D. [name], MD

## 2016-05-31 ENCOUNTER — Non-Acute Institutional Stay (SKILLED_NURSING_FACILITY): Payer: Medicare Other | Admitting: Internal Medicine

## 2016-05-31 ENCOUNTER — Encounter: Payer: Self-pay | Admitting: Internal Medicine

## 2016-05-31 DIAGNOSIS — N400 Enlarged prostate without lower urinary tract symptoms: Secondary | ICD-10-CM

## 2016-05-31 DIAGNOSIS — F3175 Bipolar disorder, in partial remission, most recent episode depressed: Secondary | ICD-10-CM | POA: Diagnosis not present

## 2016-05-31 DIAGNOSIS — F3130 Bipolar disorder, current episode depressed, mild or moderate severity, unspecified: Secondary | ICD-10-CM

## 2016-05-31 DIAGNOSIS — F319 Bipolar disorder, unspecified: Secondary | ICD-10-CM

## 2016-05-31 NOTE — Progress Notes (Signed)
Location:  [facility] Room Number: [phone] Place of Service:  SNF (31)  [name], MD  Patient Care Team: [name], MD as PCP - General (Internal Medicine)  Extended Emergency Contact Information Primary Emergency Contact: [name],[name] Address: [address]          [place], [place] [address] [name] of [place] Phone: [phone] Mobile Phone: [phone] Relation: Spouse Secondary Emergency Contact: [name], [name] of [place] Phone: [phone] Mobile Phone: [phone] Relation: Daughter    Allergies: Benzedrine [amphetamine]; Dexedrine [dextroamphetamine sulfate er]; Oxycodone; and Vancomycin  Chief Complaint  Patient presents with  . Medical Management of Chronic Issues    Routine Visit    HPI: Patient is 76 y.o. male who is being seen for routine issues of bipolar dx, bipolar depression and BPH.  Past Medical History:  Diagnosis Date  . 2nd degree AV block    a. s/p STJ dual chamber pacemaker 11/2014  . Benign neoplasm of colon     Adenomatous polyps  . Bipolar affective disorder ([place])    . BPH (benign prostatic hypertrophy)   . CAD (coronary artery disease)    a. s/p CABG  . Chronic pain    . CVA (cerebral infarction)     Small left occipital  . Depression    . DM        . Gout    . History of alcohol abuse    . Hyperlipidemia    . HYPERTENSION        . Ischemic cardiomyopathy    . Narcolepsy    . Obesity   . OBSTRUCTIVE SLEEP APNEA        . [facility] spotted fever   . TIA (transient ischemic attack)    . UNIVERSAL ULCERATIVE COLITIS          Past Surgical History:  Procedure Laterality Date  . CARDIAC CATHETERIZATION     Triple-vessel coronary artery disease. Low-normal left ventricular systolic function with mild   anteroapical wall motion abnormality.   [name] Kitchen CARDIAC CATHETERIZATION N/A 01/28/2015   Procedure: Temporary Pacemaker;   Surgeon: [name], MD;  Location: [facility];  Service: Cardiovascular;  Laterality: N/A;  . CORONARY ARTERY BYPASS GRAFT     Median sternotomy, extracorporeal circulation,  coronary artery bypass graft surgery x4 using a sequential left internal   mammary artery graft to the mid and distal left anterior descending, a   saphenous vein graft to diagonal branch of the left anterior descending,   and a saphenous vein graft to the obtuse marginal branch of left   circumflex coronary artery.    . EP IMPLANTABLE DEVICE N/A 12/12/2014   Procedure: Pacemaker Implant;  Surgeon: [name], MD;  Location: [facility];  Service: Cardiovascular;  Laterality: N/A;  . EP IMPLANTABLE DEVICE N/A 01/28/2015   Procedure: PPM Generator Removal;  Surgeon: [name], MD;  Location: [facility];  Service: Cardiovascular;  Laterality: N/A;  . EP IMPLANTABLE DEVICE N/A 03/20/2015   Procedure: Pacemaker Implant;  Surgeon: [name], MD;  Location: [facility];  Service: Cardiovascular;  Laterality: N/A;  . FLEXIBLE SIGMOIDOSCOPY  01/17/2012   Procedure: FLEXIBLE SIGMOIDOSCOPY;  Surgeon: Lafayette Dragon, MD;  Location: [facility];  Service: Endoscopy;  Laterality: N/A;  . MULTIPLE EXTRACTIONS WITH ALVEOLOPLASTY  12/01/2011   Procedure: MULTIPLE EXTRACION  WITH ALVEOLOPLASTY;  Surgeon: Lenn Cal, DDS;  Location: [facility];  Service: Oral Surgery;  Laterality: N/A;  Extraction of tooth #'s 3,4,5,6,7,8,9,10,11,12,13,14,15,16,17,20,21,22,23,24,25,26,27,28,29,30,31,32 with alveoloplasty and bilateral mandibular lingual tori  . TONSILECTOMY, ADENOIDECTOMY, BILATERAL MYRINGOTOMY AND TUBES  1946    Allergies as of 05/31/2016      Reactions   Benzedrine [amphetamine] Nausea Only   Dexedrine [dextroamphetamine Sulfate Er] Other (See Comments)   agitation   Oxycodone Nausea And Vomiting   Vancomycin    Unknown - per [facility] : [facility]      Medication List       Accurate as of 05/31/16 11:59  PM. Always use your most recent med list.          acetaminophen 650 MG suppository Commonly known as:  TYLENOL Place 650 mg rectally every 6 (six) hours as needed for fever. For fever above 100F   artificial tears ointment Place 1 drop into both eyes daily as needed (Use as directed).   aspirin 81 MG chewable tablet Chew 81 mg by mouth at bedtime.   BIOFREEZE 4 % Gel Generic drug:  Menthol (Topical Analgesic) as directed. Apply to right elbow twice daily as needed for pain.   carbidopa-levodopa 25-100 MG tablet Commonly known as:  SINEMET IR Take 2 tablets by mouth 3 (three) times daily with meals.   carvedilol 3.125 MG tablet Commonly known as:  COREG Take 3.125 mg by mouth 2 (two) times daily with a meal. Hold for SBP less than 105   clonazePAM 0.5 MG tablet Commonly known as:  KLONOPIN Take one tablet by mouth three times daily as needed for anxiety   docusate sodium 100 MG capsule Commonly known as:  COLACE Take 100 mg by mouth daily. Hold for > 2 loose stools in a day   donepezil 10 MG tablet Commonly known as:  ARICEPT Take 10 mg by mouth at bedtime.   doxazosin 1 MG tablet Commonly known as:  CARDURA Take 1 mg by mouth at bedtime.   fenofibrate 145 MG tablet Commonly known as:  TRICOR Take 145 mg by mouth daily. With a meal   Fish Oil 1000 MG Caps Take 2 capsules by mouth 2 (two) times daily.   furosemide 20 MG tablet Commonly known as:  LASIX Take 20 mg by mouth daily.   gabapentin 300 MG capsule Commonly known as:  NEURONTIN Take 300 mg by mouth 3 (three) times daily.   HUMALOG KWIKPEN 100 UNIT/ML KiwkPen Generic drug:  insulin lispro Inject 5 Units into the skin 3 (three) times daily. Hold for CBG  < 200. ( to be given in addition to sliding scale) Check FSBS before meals and at bedtime. SSI 201-25 4 units, 251-300 6 units, 301-350 8 units, 351-400 10 units   ketoconazole 2 % cream Commonly known as:  NIZORAL Apply 1 application topically as  directed. Apply to face/neck as needed   lamoTRIgine 100 MG tablet Commonly known as:  LAMICTAL Take 100 mg by mouth 2 (two) times daily.   LEVEMIR FLEXTOUCH 100 UNIT/ML Pen Generic drug:  Insulin Detemir Inject 40 Units into the skin daily.   magnesium oxide 400 MG tablet Commonly known as:  MAG-OX Take 400 mg by mouth daily.   metFORMIN 1000 MG tablet Commonly known as:  GLUCOPHAGE Take 1,000 mg by mouth 2 (two) times daily.   nitroGLYCERIN 0.4 MG SL tablet Commonly known as:  NITROSTAT Place 0.4 mg under the tongue every 5 (five) minutes as needed for chest pain.  OLANZapine 2.5 MG tablet Commonly known as:  ZYPREXA Take 2.5 mg by mouth at bedtime.   OXYGEN Inhale 2 L into the lungs continuous. To keep spo2 above 90%   polyethylene glycol packet Commonly known as:  MIRALAX / GLYCOLAX Take 17 g by mouth daily.   pravastatin 40 MG tablet Commonly known as:  PRAVACHOL Take 40 mg by mouth at bedtime.   ROBAFEN 100 MG/5ML syrup Generic drug:  guaifenesin Take 100 mg by mouth every 8 (eight) hours as needed for cough.   traMADol 50 MG tablet Commonly known as:  ULTRAM Take 50 mg by mouth as directed. Every shift for pain   traZODone 50 MG tablet Commonly known as:  DESYREL Take 50 mg by mouth at bedtime.   Vitamin D (Ergocalciferol) 50000 units Caps capsule Commonly known as:  DRISDOL Take 50,000 Units by mouth every 30 (thirty) days.       No orders of the defined types were placed in this encounter.   There is no immunization history for the selected administration types on file for this patient.  Social History  Substance Use Topics  . Smoking status: Former Smoker    Quit date: 08/18/2014  . Smokeless tobacco: Former Systems developer  . Alcohol use No     Comment: quit 2003    Review of Systems  DATA OBTAINED: from patient- contributes very little nurse GENERAL:  no fevers, fatigue, appetite changes SKIN: No itching, rash HEENT: No  complaint RESPIRATORY: No cough, wheezing, SOB CARDIAC: No chest pain, palpitations, lower extremity edema  GI: No abdominal pain, No N/V/D or constipation, No heartburn or reflux  GU: No dysuria, frequency or urgency, or incontinence  MUSCULOSKELETAL: No unrelieved bone/joint pain NEUROLOGIC: No headache, dizziness  PSYCHIATRIC: No overt anxiety or sadness  Vitals:   05/31/16 1034  BP: 124/79  Pulse: 75  Resp: 18  Temp: 98.8 F (37.1 C)   Body mass index is 31.81 kg/m. Physical Exam  GENERAL APPEARANCE: Alert, min conversant, No acute distress ; oese WM SKIN: No diaphoresis rash HEENT: Unremarkable RESPIRATORY: Breathing is even, unlabored. Lung sounds are clear   CARDIOVASCULAR: Heart RRR no murmurs, rubs or gallops. No peripheral edema  GASTROINTESTINAL: Abdomen is soft, non-tender, not distended w/ normal bowel sounds.  GENITOURINARY: Bladder non tender, not distended  MUSCULOSKELETAL: No abnormal joints or musculature NEUROLOGIC: Cranial nerves 2-12 grossly intact. Moves all extremities PSYCHIATRIC: Mood and affect odd with dementia, no behavioral issues  Patient Active Problem List   Diagnosis Date Noted  . Aspiration pneumonia ([place]) 09/30/2015  . Parkinsonism ([place]) 09/30/2015  . Tremor 08/11/2015  . Suicidal ideation 05/10/2015  . Bipolar depression ([place]) 05/10/2015  . Complete heart block ([place]) 03/20/2015  . Bradycardia 03/14/2015  . Dementia with behavioral disturbance 02/15/2015  . Staphylococcus aureus bacteremia with sepsis ([place]) 01/28/2015  . Infection of pacemaker pocket ([place]) 01/28/2015  . Acute respiratory failure ([place]) 01/26/2015  . AKI (acute kidney injury) ([place]) 01/26/2015  . Acute encephalopathy 01/26/2015  . Altered mental status   . Vitamin D deficiency 01/07/2015  . Mobitz type 2 second degree atrioventricular block 12/12/2014  . Pacemaker 12/12/2014  . Symptomatic bradycardia 12/03/2014  . Lower back pain 06/12/2012  . Right hip pain  06/12/2012  . Intertrigo 05/09/2012  . Insomnia 04/19/2012  . General weakness 03/07/2012  . 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 Diarrhea 01/17/2012  . Pyelonephritis 01/15/2012  . Diabetic peripheral neuropathy ([place]) 09/28/2011  . Urinary incontinence 09/28/2011  . Preventative  health care 09/23/2011  . Gout 09/23/2011  . Bipolar affective disorder ([place]) 09/23/2011  . TIA (transient ischemic attack) 09/23/2011  . Chronic pain 09/23/2011  . DNR (do not resuscitate) 09/23/2011  . CVA (cerebral infarction) 09/23/2011  . 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 History of alcohol abuse 09/23/2011  . Benign prostatic hyperplasia   . Left ventricular systolic dysfunction   . Chronic systolic CHF (congestive heart failure) ([place]) 05/05/2011  . Edema 01/03/2011  . CAD (coronary artery disease) 05/18/2010  . Hyperlipidemia 05/18/2010  . Other abnormality of urination(788.69) 11/25/2009  . Other specified forms of chronic ischemic heart disease 05/19/2009  . CAD, ARTERY BYPASS GRAFT 11/18/2008  . AMI, INFERIOR WALL 09/30/2008  . Obstructive sleep apnea 09/16/2008  . SINUS TACHYCARDIA 09/02/2008  . DM (diabetes mellitus), type 2 with peripheral vascular complications ([place]) 82/95/6213  . OBESITY 09/01/2008  . Hypertensive heart disease with heart failure ([place]) 09/01/2008  . TOBACCO ABUSE 07/31/2007  . 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    CMP     Component Value Date/Time   NA 139 06/08/2016   K 4.0 06/08/2016   CL 102 03/20/2015 1038   CO2 28 03/20/2015 1038   GLUCOSE 171 (H) 03/20/2015 1038   BUN 18 06/08/2016   CREATININE 0.8 06/08/2016   CREATININE 1.23 03/20/2015 1038   CREATININE 1.06 12/05/2014 1536   CALCIUM 9.6 03/20/2015 1038   PROT 5.8 (L) 02/01/2015 0245   ALBUMIN 2.2 (L) 02/01/2015 0245   AST 14 06/08/2016   ALT 16 06/08/2016   ALKPHOS 24 (A) 06/08/2016   BILITOT 0.4 02/01/2015 0245   GFRNONAA 55 (L) 03/20/2015 1038   GFRAA >60 03/20/2015  1038    Recent Labs  11/12/15 02/10/16 06/08/16  NA 137 138 139  K 4.3 4.3 4.0  BUN 31* 25* 18  CREATININE 1.2 0.9 0.8    Recent Labs  09/25/15 11/12/15 06/08/16  AST 25 30 14   ALT 14 14 16   ALKPHOS 44 29 24*    Recent Labs  09/24/15 0840 11/12/15 06/08/16  WBC 12.5 6.0 6.7  6.7  HGB 14.3 14.3 12.8*  12.8*  HCT 42 44 40*  40*  PLT 200 203 211  211    Recent Labs  07/29/15 08/14/15 11/12/15 06/09/16  CHOL 165  --  172 134  LDLCALC  --  55  --  42  TRIG 623*  --  611* 295*   Lab Results  Component Value Date   MICROALBUR >42.6 04/09/2016   Lab Results  Component Value Date   TSH 1.60 06/09/2016   Lab Results  Component Value Date   HGBA1C 8 06/08/2016   Lab Results  Component Value Date   CHOL 134 06/09/2016   HDL 33 (A) 06/09/2016   LDLCALC 42 06/09/2016   LDLDIRECT 85.2 06/13/2012   TRIG 295 (A) 06/09/2016   CHOLHDL 7 06/13/2012    Significant Diagnostic Results in last 30 days:  No results found.  Assessment and Plan  Bipolar affective disorder ([place]) Pt's mood has been stable;plan to cont lamictal 100 mg BID and zyprexa 2.5 mg qHS  Bipolar depression ([facility]) Chronic baseline; cont trazodone 50 mg qHS  Benign prostatic hyperplasia No reported problems or infections; cont cardura 1 mg daily    Anne D. [name], MD

## 2016-06-05 ENCOUNTER — Encounter: Payer: Self-pay | Admitting: Internal Medicine

## 2016-06-05 NOTE — Assessment & Plan Note (Signed)
No reports of pain;plan to cont neurontin 300 mg TID

## 2016-06-05 NOTE — Assessment & Plan Note (Signed)
Stable, no major declines; depressibe bipolar;plan to cont qricept 10 mg daily

## 2016-06-05 NOTE — Assessment & Plan Note (Signed)
Last A1c was 9 which is an improvement; both pt and family are non compliant with diet; plan to cont glucophage 1000 mg BID, levemir 40 u daily and 5u reg with meals

## 2016-06-08 LAB — BASIC METABOLIC PANEL
BUN: 18 mg/dL (ref 4–21)
Creatinine: 0.8 mg/dL (ref 0.6–1.3)
GLUCOSE: 161 mg/dL
POTASSIUM: 4 mmol/L (ref 3.4–5.3)
Sodium: 139 mmol/L (ref 137–147)

## 2016-06-08 LAB — CBC AND DIFFERENTIAL
HCT: 40 % — AB (ref 41–53)
HEMATOCRIT: 40 % — AB (ref 41–53)
HEMOGLOBIN: 12.8 g/dL — AB (ref 13.5–17.5)
Hemoglobin: 12.8 g/dL — AB (ref 13.5–17.5)
PLATELETS: 211 10*3/uL (ref 150–399)
PLATELETS: 211 10*3/uL (ref 150–399)
WBC: 6.7 10*3/mL
WBC: 6.7 10^3/mL

## 2016-06-08 LAB — HEPATIC FUNCTION PANEL
ALT: 16 U/L (ref 10–40)
AST: 14 U/L (ref 14–40)
Alkaline Phosphatase: 24 U/L — AB (ref 25–125)
BILIRUBIN, TOTAL: 0.2 mg/dL

## 2016-06-08 LAB — HEMOGLOBIN A1C: Hemoglobin A1C: 8

## 2016-06-09 LAB — LIPID PANEL
Cholesterol: 134 mg/dL (ref 0–200)
HDL: 33 mg/dL — AB (ref 35–70)
LDL Cholesterol: 42 mg/dL
Triglycerides: 295 mg/dL — AB (ref 40–160)

## 2016-06-09 LAB — VITAMIN D 25 HYDROXY (VIT D DEFICIENCY, FRACTURES): Vit D, 25-Hydroxy: 28.47

## 2016-06-09 LAB — TSH: TSH: 1.6 u[IU]/mL (ref 0.41–5.90)

## 2016-06-14 ENCOUNTER — Non-Acute Institutional Stay (SKILLED_NURSING_FACILITY): Payer: Medicare Other | Admitting: Internal Medicine

## 2016-06-14 ENCOUNTER — Ambulatory Visit (INDEPENDENT_AMBULATORY_CARE_PROVIDER_SITE_OTHER): Payer: Medicare Other | Admitting: Diagnostic Neuroimaging

## 2016-06-14 ENCOUNTER — Encounter: Payer: Self-pay | Admitting: Diagnostic Neuroimaging

## 2016-06-14 VITALS — BP 133/79 | HR 79 | Wt 220.0 lb

## 2016-06-14 DIAGNOSIS — R600 Localized edema: Secondary | ICD-10-CM | POA: Diagnosis not present

## 2016-06-14 DIAGNOSIS — R471 Dysarthria and anarthria: Secondary | ICD-10-CM | POA: Diagnosis not present

## 2016-06-14 DIAGNOSIS — G214 Vascular parkinsonism: Secondary | ICD-10-CM

## 2016-06-14 DIAGNOSIS — G252 Other specified forms of tremor: Secondary | ICD-10-CM

## 2016-06-14 DIAGNOSIS — R269 Unspecified abnormalities of gait and mobility: Secondary | ICD-10-CM

## 2016-06-14 NOTE — Progress Notes (Signed)
[facility]  PATIENT: [name] DOB: Jun 13, 1940  REFERRING CLINICIAN: Inocencio Homes, MD HISTORY FROM: patient and wife  REASON FOR VISIT: follow up   HISTORICAL  CHIEF COMPLAINT:  Chief Complaint  Patient presents with  . Vascular parkinsonism    rm 7, wife- [name], resides at [facility], "not shaking as much; no new problems"   . Follow-up    6 month    HISTORY OF PRESENT ILLNESS:   UPDATE 06/14/16: Since last visit, tremor symptoms are improved with carb/levo. Also with increased right hand swelling. Still non-ambulatory.   UPDATE 12/14/15: Since last visit, symptoms are stable. Carb/levo not helping with tremor control. No med side effects. Now off primidone.   PRIOR HPI (09/11/15): 76 year old male here for evaluation of tremor. Patient has complex medical history including coronary artery disease, ischemic cardiomyopathy, hypertension, diabetes, obstructive sleep apnea, gout, stroke, dementia, generalized functional decline.  Patient reports 6 month history of postural and action tremor, mainly affecting him when he is holding a cup or using utensils. Symptoms are worse in the past one month. Patient currently lives at [facility] for the past one year, and apparently carries a diagnosis of Parkinson's disease and dementia. Apparently he saw Dr. Erling Cruz 6 years ago and was diagnosed with "early Parkinson's disease". Patient saw PCP recently started primidone for tremor control, which has not helped.   REVIEW OF SYSTEMS: Full 14 system review of systems performed and negative with exception of: depression leg swelling diff urinating lew swelling.     ALLERGIES: Allergies  Allergen Reactions  . Benzedrine [Amphetamine] Nausea Only  . Dexedrine [Dextroamphetamine Sulfate Er] Other (See Comments)    agitation  . Oxycodone Nausea And Vomiting  . Vancomycin     Unknown - per [facility]    HOME MEDICATIONS: Outpatient Medications Prior to  Visit  Medication Sig Dispense Refill  . acetaminophen (TYLENOL) 650 MG suppository Place 650 mg rectally every 6 (six) hours as needed for fever. For fever above 100F    . Artificial Tear Ointment (ARTIFICIAL TEARS) ointment Place 1 drop into both eyes daily as needed (Use as directed).     [name] Kitchen aspirin 81 MG chewable tablet Chew 81 mg by mouth at bedtime.     . carbidopa-levodopa (SINEMET IR) 25-100 MG tablet Take 2 tablets by mouth 3 (three) times daily with meals.     . carvedilol (COREG) 3.125 MG tablet Take 3.125 mg by mouth 2 (two) times daily with a meal. Hold for SBP less than 105     . clonazePAM (KLONOPIN) 0.5 MG tablet Take one tablet by mouth three times daily as needed for anxiety 90 tablet 0  . docusate sodium (COLACE) 100 MG capsule Take 100 mg by mouth daily. Hold for > 2 loose stools in a day     . donepezil (ARICEPT) 10 MG tablet Take 10 mg by mouth at bedtime.    [name] Kitchen doxazosin (CARDURA) 1 MG tablet Take 1 mg by mouth at bedtime.     . fenofibrate (TRICOR) 145 MG tablet Take 145 mg by mouth daily. With a meal    . furosemide (LASIX) 20 MG tablet Take 20 mg by mouth daily.     [name] Kitchen guaifenesin (ROBAFEN) 100 MG/5ML syrup Take 100 mg by mouth every 8 (eight) hours as needed for cough.    . insulin lispro (HUMALOG KWIKPEN) 100 UNIT/ML KiwkPen Inject 5 Units into the skin 3 (three) times daily. Hold for CBG  < 200. (  to be given in addition to sliding scale) Check FSBS before meals and at bedtime. SSI 201-25 4 units, 251-300 6 units, 301-350 8 units, 351-400 10 units    . ketoconazole (NIZORAL) 2 % cream Apply 1 application topically as directed. Apply to face/neck as needed     . lamoTRIgine (LAMICTAL) 100 MG tablet Take 100 mg by mouth 2 (two) times daily.     [name] Kitchen LEVEMIR FLEXTOUCH 100 UNIT/ML Pen Inject 40 Units into the skin daily.     . magnesium oxide (MAG-OX) 400 MG tablet Take 400 mg by mouth daily.     . Menthol, Topical Analgesic, (BIOFREEZE) 4 % GEL as directed. Apply to right  elbow twice daily as needed for pain.     . metFORMIN (GLUCOPHAGE) 1000 MG tablet Take 1,000 mg by mouth 2 (two) times daily.     . nitroGLYCERIN (NITROSTAT) 0.4 MG SL tablet Place 0.4 mg under the tongue every 5 (five) minutes as needed for chest pain.    [name] Kitchen OLANZapine (ZYPREXA) 2.5 MG tablet Take 2.5 mg by mouth at bedtime.    . Omega-3 Fatty Acids (FISH OIL) 1000 MG CAPS Take 2 capsules by mouth 2 (two) times daily.     . OXYGEN Inhale 2 L into the lungs continuous. To keep spo2 above 90%    . polyethylene glycol (MIRALAX / GLYCOLAX) packet Take 17 g by mouth daily.     . pravastatin (PRAVACHOL) 40 MG tablet Take 40 mg by mouth at bedtime.     . traMADol (ULTRAM) 50 MG tablet Take 50 mg by mouth as directed. Every shift for pain    . traZODone (DESYREL) 50 MG tablet Take 50 mg by mouth at bedtime.    . Vitamin D, Ergocalciferol, (DRISDOL) 50000 units CAPS capsule Take 50,000 Units by mouth every 30 (thirty) days.    [name] Kitchen gabapentin (NEURONTIN) 300 MG capsule Take 300 mg by mouth 3 (three) times daily.      No facility-administered medications prior to visit.     PAST MEDICAL HISTORY: Past Medical History:  Diagnosis Date  . 2nd degree AV block    a. s/p STJ dual chamber pacemaker 11/2014  . Benign neoplasm of colon     Adenomatous polyps  . Bipolar affective disorder ([place])    . BPH (benign prostatic hypertrophy)   . CAD (coronary artery disease)    a. s/p CABG  . Chronic pain    . CVA (cerebral infarction)     Small left occipital  . Depression    . DM        . Gout    . History of alcohol abuse    . Hyperlipidemia    . HYPERTENSION        . Ischemic cardiomyopathy    . Narcolepsy    . Obesity   . OBSTRUCTIVE SLEEP APNEA        . [facility] spotted fever   . TIA (transient ischemic attack)    . UNIVERSAL ULCERATIVE COLITIS          PAST SURGICAL HISTORY: Past Surgical History:  Procedure Laterality Date  . CARDIAC CATHETERIZATION     Triple-vessel coronary  artery disease. Low-normal left ventricular systolic function with mild   anteroapical wall motion abnormality.   [name] Kitchen CARDIAC CATHETERIZATION N/A 01/28/2015   Procedure: Temporary Pacemaker;  Surgeon: [name], MD;  Location: [place] [facility];  Service: Cardiovascular;  Laterality: N/A;  . CORONARY ARTERY BYPASS GRAFT  Median sternotomy, extracorporeal circulation,  coronary artery bypass graft surgery x4 using a sequential left internal   mammary artery graft to the mid and distal left anterior descending, a   saphenous vein graft to diagonal branch of the left anterior descending,   and a saphenous vein graft to the obtuse marginal branch of left   circumflex coronary artery.    . EP IMPLANTABLE DEVICE N/A 12/12/2014   Procedure: Pacemaker Implant;  Surgeon: [name], MD;  Location: [place] [facility];  Service: Cardiovascular;  Laterality: N/A;  . EP IMPLANTABLE DEVICE N/A 01/28/2015   Procedure: PPM Generator Removal;  Surgeon: [name], MD;  Location: [place] [facility];  Service: Cardiovascular;  Laterality: N/A;  . EP IMPLANTABLE DEVICE N/A 03/20/2015   Procedure: Pacemaker Implant;  Surgeon: [name], MD;  Location: [place] [facility];  Service: Cardiovascular;  Laterality: N/A;  . FLEXIBLE SIGMOIDOSCOPY  01/17/2012   Procedure: FLEXIBLE SIGMOIDOSCOPY;  Surgeon: Lafayette Dragon, MD;  Location: [facility];  Service: Endoscopy;  Laterality: N/A;  . MULTIPLE EXTRACTIONS WITH ALVEOLOPLASTY  12/01/2011   Procedure: MULTIPLE EXTRACION WITH ALVEOLOPLASTY;  Surgeon: Lenn Cal, DDS;  Location: [place];  Service: Oral Surgery;  Laterality: N/A;  Extraction of tooth #'s 3,4,5,6,7,8,9,10,11,12,13,14,15,16,17,20,21,22,23,24,25,26,27,28,29,30,31,32 with alveoloplasty and bilateral mandibular lingual tori  . TONSILECTOMY, ADENOIDECTOMY, BILATERAL MYRINGOTOMY AND TUBES  1946    FAMILY HISTORY: Family History  Problem Relation Age of Onset  . Diabetes Father   . Heart  disease Father   . Stroke Father   . Heart attack Mother   . Aortic aneurysm Mother   . Alcohol abuse Other   . Arthritis Other   . Heart disease Other   . Mental illness Other   . Diabetes Other   . Alcohol abuse Other   . Arthritis Other   . Hyperlipidemia Other   . Heart disease Other   . Stroke Other   . Hypertension Other   . Diabetes Other   . Mental illness Other   . Heart disease Brother     SOCIAL HISTORY:  Social History   Social History  . Marital status: Married    Spouse name: [name]  . Number of children: 2  . Years of education: 12   Occupational History  . retired Retired    Ship broker   Social History Main Topics  . Smoking status: Former Smoker    Quit date: 08/18/2014  . Smokeless tobacco: Former Systems developer  . Alcohol use No     Comment: quit 2003  . Drug use: No  . Sexual activity: No   Other Topics Concern  . Not on file   Social History Narrative   Patient has been married for 51 years.   Patient presents with his wife today.    Patient has 2 grown children and lives in [place] and [place] areas respectively.   Patient is a nonsmoker, nondrinker at this time.   09/11/15 lives at [facility] and [place] since last year   Caffeine- 1 soda daily     PHYSICAL EXAM  GENERAL EXAM/CONSTITUTIONAL: Vitals:  Vitals:   06/14/16 1303  BP: 133/79  Pulse: 79  Weight: 220 lb (99.8 kg)   Body mass index is 33.45 kg/m. No exam data present  Patient is in no distress; well developed, nourished and groomed; neck is supple  IN WHEEL CHAIR  SLURRED SPEECH, MASKED FACIES, HOARSE VOICE  RIGHT HAND SWELLING  CARDIOVASCULAR:  Examination  of carotid arteries is normal; no carotid bruits  Regular rate and rhythm, no murmurs  Examination of peripheral vascular system by observation and palpation is normal  EYES:  Ophthalmoscopic exam of optic discs and posterior segments is normal; no papilledema or  hemorrhages  MUSCULOSKELETAL:  Gait, strength, tone, movements noted in Neurologic exam below  NEUROLOGIC: MENTAL STATUS:  No flowsheet data found.  awake, alert, oriented to person, place --> "2018, MAY 10"  DECR memory  normal attention and concentration  language fluent, comprehension intact, naming intact,   fund of knowledge appropriate  CRANIAL NERVE:   2nd - no papilledema on fundoscopic exam  2nd, 3rd, 4th, 6th - pupils equal and reactive to light, visual fields full to confrontation, extraocular muscles intact, no nystagmus  5th - facial sensation symmetric  7th - facial strength symmetric  8th - hearing intact  9th - palate elevates symmetrically, uvula midline  11th - shoulder shrug symmetric  12th - tongue protrusion midline  MOTOR:   normal bulk  COGWHEELING IN BUE; INCREASED TONE IN BLE  BUE 4; LIMITED ROM AT SHOULDERS  BLE 3  POSTURAL AND ACTION TREMOR IN BUE  RESTING TREMOR IN RUE  SENSORY:   normal and symmetric to light touch; ABSENT VIBRATION AT TOES  COORDINATION:   finger-nose-finger, fine finger movements SLOW   REFLEXES:   deep tendon reflexes TRACE and symmetric  GAIT/STATION:   UNABLE TO STAND; IN WHEELCHAIR    DIAGNOSTIC DATA (LABS, IMAGING, TESTING) - I reviewed patient records, labs, notes, testing and imaging myself where available.  Lab Results  Component Value Date   WBC 6.0 11/12/2015   HGB 14.3 11/12/2015   HCT 44 11/12/2015   MCV 91.6 03/20/2015   PLT 203 11/12/2015      Component Value Date/Time   NA 138 02/10/2016   K 4.3 02/10/2016   CL 102 03/20/2015 1038   CO2 28 03/20/2015 1038   GLUCOSE 171 (H) 03/20/2015 1038   BUN 25 (A) 02/10/2016   CREATININE 0.9 02/10/2016   CREATININE 1.23 03/20/2015 1038   CREATININE 1.06 12/05/2014 1536   CALCIUM 9.6 03/20/2015 1038   PROT 5.8 (L) 02/01/2015 0245   ALBUMIN 2.2 (L) 02/01/2015 0245   AST 30 11/12/2015   ALT 14 11/12/2015   ALKPHOS 29  11/12/2015   BILITOT 0.4 02/01/2015 0245   GFRNONAA 55 (L) 03/20/2015 1038   GFRAA >60 03/20/2015 1038   Lab Results  Component Value Date   CHOL 172 11/12/2015   HDL 33 (A) 11/12/2015   LDLCALC 55 08/14/2015   LDLDIRECT 85.2 06/13/2012   TRIG 611 (A) 11/12/2015   CHOLHDL 7 06/13/2012   Lab Results  Component Value Date   HGBA1C 9 02/10/2016   Lab Results  Component Value Date   VITAMINB12 780 07/15/2008   Lab Results  Component Value Date   TSH 0.92 11/12/2015    01/26/15 CT head [I reviewed images myself and agree with interpretation. -VRP]  - Mildly progressive chronic small vessel ischemic changes compared with prior CT from 2014. No acute intracranial findings.  09/22/15 CT head (without) [I reviewed images myself and agree with interpretation. -VRP] 1. Moderate periventricular and subcortical chronic small vessel ischemic disease. 2. Chronic left occipital ischemic infarction. 3. Mild generalized and moderate perisylvian atrophy.  4. No acute findings. 5. No significant change from CT on 01/26/15.    ASSESSMENT AND PLAN  76 y.o. year old male here with progressive postural and action tremor, with history  of Parkinson's disease diagnosis from 2011. Also with significant vascular risk factors. 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 Now on trial of carbidopa levodopa for symptom control with some benefit. Primidone has not been effective thus far at low dose and therefore was tapered off.    Dx:  1. 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 Gait difficulty   3. Dysarthria   4. Postural tremor   5. Action tremor      PLAN:  I spent 15 minutes of face to face time with patient. Greater than 50% of time was spent in counseling and coordination of care with patient. In summary we discussed:   VASCULAR PARKINSONISM (established problem, improved) - continue carbidopa/levodopa (25/100) to 2 tabs three times per day (with meals)  GAIT DIFFICULTY (established problem,  stable)  DYSARTHRIA/DYSPHAGIA (established problem, stable)  Return in about 1 year (around 06/14/2017).    [name], MD 3/88/8757, 9:72 PM Certified in Neurology, Neurophysiology and Neuroimaging  [facility] [address] [place], [place] [address] [phone]

## 2016-06-15 ENCOUNTER — Encounter: Payer: Self-pay | Admitting: Internal Medicine

## 2016-06-15 NOTE — Progress Notes (Signed)
Location:  [facility] Room Number: [phone] Place of Service:  SNF (31)  [name], MD  Patient Care Team: [name], MD as PCP - General (Internal Medicine)  Extended Emergency Contact Information Primary Emergency Contact: [name],[name] Address: [address]          [place], [place] [address] [name] of [place] Phone: [phone] Mobile Phone: [phone] Relation: Spouse Secondary Emergency Contact: [name], [name] Phone: [phone] Mobile Phone: [phone] Relation: Daughter    Allergies: Benzedrine [amphetamine]; Dexedrine [dextroamphetamine sulfate er]; Oxycodone; and Vancomycin  Chief Complaint  Patient presents with  . Acute Visit    Acute    HPI: Patient is 76 y.o. male who Is being seen acutely because nursing has noted increased lower extremity edema. Patient with dementia and is not very good with history; however he denies any shortness of breath.  Past Medical History:  Diagnosis Date  . 2nd degree AV block    a. s/p STJ dual chamber pacemaker 11/2014  . Benign neoplasm of colon     Adenomatous polyps  . Bipolar affective disorder ([place])    . BPH (benign prostatic hypertrophy)   . CAD (coronary artery disease)    a. s/p CABG  . Chronic pain    . CVA (cerebral infarction)     Small left occipital  . Depression    . DM        . Gout    . History of alcohol abuse    . Hyperlipidemia    . HYPERTENSION        . Ischemic cardiomyopathy    . Narcolepsy    . Obesity   . OBSTRUCTIVE SLEEP APNEA        . [facility] spotted fever   . TIA (transient ischemic attack)    . UNIVERSAL ULCERATIVE COLITIS          Past Surgical History:  Procedure Laterality Date  . CARDIAC CATHETERIZATION     Triple-vessel coronary artery disease. Low-normal left ventricular systolic function with mild   anteroapical wall motion abnormality.   [name] Kitchen CARDIAC  CATHETERIZATION N/A 01/28/2015   Procedure: Temporary Pacemaker;  Surgeon: [name], MD;  Location: [facility];  Service: Cardiovascular;  Laterality: N/A;  . CORONARY ARTERY BYPASS GRAFT     Median sternotomy, extracorporeal circulation,  coronary artery bypass graft surgery x4 using a sequential left internal   mammary artery graft to the mid and distal left anterior descending, a   saphenous vein graft to diagonal branch of the left anterior descending,   and a saphenous vein graft to the obtuse marginal branch of left   circumflex coronary artery.    . EP IMPLANTABLE DEVICE N/A 12/12/2014   Procedure: Pacemaker Implant;  Surgeon: [name], MD;  Location: [facility];  Service: Cardiovascular;  Laterality: N/A;  . EP IMPLANTABLE DEVICE N/A 01/28/2015   Procedure: PPM Generator Removal;  Surgeon: [name], MD;  Location: [facility];  Service: Cardiovascular;  Laterality: N/A;  . EP IMPLANTABLE DEVICE N/A 03/20/2015   Procedure: Pacemaker Implant;  Surgeon: [name], MD;  Location: [facility];  Service: Cardiovascular;  Laterality: N/A;  . FLEXIBLE SIGMOIDOSCOPY  01/17/2012   Procedure: FLEXIBLE SIGMOIDOSCOPY;  Surgeon: Lafayette Dragon, MD;  Location: [facility];  Service: Endoscopy;  Laterality: N/A;  .  MULTIPLE EXTRACTIONS WITH ALVEOLOPLASTY  12/01/2011   Procedure: MULTIPLE EXTRACION WITH ALVEOLOPLASTY;  Surgeon: Lenn Cal, DDS;  Location: [facility];  Service: Oral Surgery;  Laterality: N/A;  Extraction of tooth #'s 3,4,5,6,7,8,9,10,11,12,13,14,15,16,17,20,21,22,23,24,25,26,27,28,29,30,31,32 with alveoloplasty and bilateral mandibular lingual tori  . TONSILECTOMY, ADENOIDECTOMY, BILATERAL MYRINGOTOMY AND TUBES  1946    Allergies as of 06/14/2016      Reactions   Benzedrine [amphetamine] Nausea Only   Dexedrine [dextroamphetamine Sulfate Er] Other (See Comments)   agitation   Oxycodone Nausea And Vomiting   Vancomycin    Unknown - per  [facility]      Medication List       Accurate as of 06/14/16 11:59 PM. Always use your most recent med list.          acetaminophen 650 MG suppository Commonly known as:  TYLENOL Place 650 mg rectally every 6 (six) hours as needed for fever. For fever above 100F   aspirin 81 MG chewable tablet Chew 81 mg by mouth at bedtime.   BIOFREEZE 4 % Gel Generic drug:  Menthol (Topical Analgesic) as directed. Apply to right elbow twice daily as needed for pain.   carbidopa-levodopa 25-100 MG tablet Commonly known as:  SINEMET IR Take 2 tablets by mouth 3 (three) times daily with meals.   carboxymethylcellulose 0.5 % Soln Commonly known as:  REFRESH PLUS Place 1 drop into both eyes daily as needed.   carvedilol 3.125 MG tablet Commonly known as:  COREG Take 3.125 mg by mouth 2 (two) times daily with a meal. Hold for SBP less than 105   clonazePAM 0.5 MG tablet Commonly known as:  KLONOPIN Take 0.5 mg by mouth at bedtime.   docusate sodium 100 MG capsule Commonly known as:  COLACE Take 100 mg by mouth daily. Hold for > 2 loose stools in a day   donepezil 10 MG tablet Commonly known as:  ARICEPT Take 10 mg by mouth at bedtime.   doxazosin 1 MG tablet Commonly known as:  CARDURA Take 1 mg by mouth at bedtime.   feeding supplement (PRO-STAT SUGAR FREE 64) Liqd Take 30 mLs by mouth 2 (two) times daily.   fenofibrate 145 MG tablet Commonly known as:  TRICOR Take 145 mg by mouth daily. With a meal   Fish Oil 1000 MG Caps Take 2 capsules by mouth 2 (two) times daily.   furosemide 20 MG tablet Commonly known as:  LASIX Take 40 mg by mouth daily. For 5 days then notify for further orders   gabapentin 400 MG capsule Commonly known as:  NEURONTIN 400 mg 3 (three) times daily. [place] 100 UNIT/ML KiwkPen Generic drug:  insulin lispro Inject 5 Units into the skin 3 (three) times daily. Hold for CBG  < 200. ( to be given in addition to sliding scale) Check FSBS  before meals and at bedtime. SSI 201-25 4 units, 251-300 6 units, 301-350 8 units, 351-400 10 units   ketoconazole 2 % cream Commonly known as:  NIZORAL Apply 1 application topically as directed. Apply to face/neck as needed   lamoTRIgine 25 MG tablet Commonly known as:  LAMICTAL 25 mg daily. 06/14/16 [place] MAR- give one tab with Lamictal 100 mg to equal 125 mg daily   lamoTRIgine 100 MG tablet Commonly known as:  LAMICTAL Take 100 mg by mouth 2 (two) times daily.   LEVEMIR FLEXTOUCH 100 UNIT/ML Pen Generic drug:  Insulin Detemir Inject 40 Units into the skin daily.  magnesium oxide 400 MG tablet Commonly known as:  MAG-OX Take 400 mg by mouth daily.   metFORMIN 1000 MG tablet Commonly known as:  GLUCOPHAGE Take 1,000 mg by mouth 2 (two) times daily.   multivitamin tablet Take 1 tablet by mouth daily.   nitroGLYCERIN 0.4 MG SL tablet Commonly known as:  NITROSTAT Place 0.4 mg under the tongue every 5 (five) minutes as needed for chest pain.   OLANZapine 2.5 MG tablet Commonly known as:  ZYPREXA Take 2.5 mg by mouth at bedtime.   OXYGEN Inhale 2 L into the lungs continuous. To keep spo2 above 90%   polyethylene glycol packet Commonly known as:  MIRALAX / GLYCOLAX Take 17 g by mouth daily.   pravastatin 40 MG tablet Commonly known as:  PRAVACHOL Take 40 mg by mouth at bedtime.   ROBAFEN 100 MG/5ML syrup Generic drug:  guaifenesin Take 100 mg by mouth every 8 (eight) hours as needed for cough.   traMADol 50 MG tablet Commonly known as:  ULTRAM Take 50 mg by mouth as directed. Every shift for pain   traZODone 50 MG tablet Commonly known as:  DESYREL Take 50 mg by mouth at bedtime.   Vitamin D (Ergocalciferol) 50000 units Caps capsule Commonly known as:  DRISDOL Take 50,000 Units by mouth every 30 (thirty) days.   zinc sulfate 220 (50 Zn) MG capsule Take 220 mg by mouth daily. Stop date 07/06/16       Meds ordered this encounter  Medications  .  zinc sulfate 220 (50 Zn) MG capsule    Sig: Take 220 mg by mouth daily. Stop date 07/06/16  . carboxymethylcellulose (REFRESH PLUS) 0.5 % SOLN    Sig: Place 1 drop into both eyes daily as needed.  . clonazePAM (KLONOPIN) 0.5 MG tablet    Sig: Take 0.5 mg by mouth at bedtime.  . furosemide (LASIX) 20 MG tablet    Sig: Take 40 mg by mouth daily. For 5 days then notify for further orders    There is no immunization history for the selected administration types on file for this patient.  Social History  Substance Use Topics  . Smoking status: Former Smoker    Quit date: 08/18/2014  . Smokeless tobacco: Former Systems developer  . Alcohol use No     Comment: quit 2003    Review of Systems  DATA OBTAINED: from patient-can participate to a limited degree, nurse GENERAL:  no fevers, fatigue, appetite changes SKIN: No itching, rash HEENT: No complaint RESPIRATORY: No cough, wheezing, SOB CARDIAC: No chest pain, palpitations, lower extremity edema  GI: No abdominal pain, No N/V/D or constipation, No heartburn or reflux  GU: No dysuria, frequency or urgency, or incontinence  MUSCULOSKELETAL: No unrelieved bone/joint pain NEUROLOGIC: No headache, dizziness  PSYCHIATRIC: No overt anxiety or sadness  Vitals:   06/14/16 1358  BP: 117/76  Pulse: 69  Resp: 18  Temp: 99.3 F (37.4 C)   Body mass index is 33.45 kg/m. Physical Exam  GENERAL APPEARANCE: Alert, conversant, No acute distress  SKIN: No diaphoresis rash HEENT: Unremarkable RESPIRATORY: Breathing is even, unlabored. Lung sounds are clear    CARDIOVASCULAR: Heart RRR no murmurs, rubs or gallops. Trace peripheral edema on right lower extremity, 1+ edema left lower extremity GASTROINTESTINAL: Abdomen is soft, non-tender, not distended w/ normal bowel sounds.  GENITOURINARY: Bladder non tender, not distended  MUSCULOSKELETAL: No abnormal joints or musculature NEUROLOGIC: Cranial nerves 2-12 grossly intact. Moves all  extremities PSYCHIATRIC: Mood and affect  with dementia, no behavioral issues  Patient Active Problem List   Diagnosis Date Noted  . Aspiration pneumonia ([place]) 09/30/2015  . Parkinsonism ([place]) 09/30/2015  . Tremor 08/11/2015  . Suicidal ideation 05/10/2015  . Bipolar depression ([place]) 05/10/2015  . Complete heart block ([place]) 03/20/2015  . Bradycardia 03/14/2015  . Dementia with behavioral disturbance 02/15/2015  . Staphylococcus aureus bacteremia with sepsis ([place]) 01/28/2015  . Infection of pacemaker pocket ([place]) 01/28/2015  . Acute respiratory failure ([place]) 01/26/2015  . AKI (acute kidney injury) ([place]) 01/26/2015  . Acute encephalopathy 01/26/2015  . Altered mental status   . Vitamin D deficiency 01/07/2015  . Mobitz type 2 second degree atrioventricular block 12/12/2014  . Pacemaker 12/12/2014  . Symptomatic bradycardia 12/03/2014  . Lower back pain 06/12/2012  . Right hip pain 06/12/2012  . Intertrigo 05/09/2012  . Insomnia 04/19/2012  . General weakness 03/07/2012  . 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 Diarrhea 01/17/2012  . Pyelonephritis 01/15/2012  . Diabetic peripheral neuropathy ([place]) 09/28/2011  . Urinary incontinence 09/28/2011  . Preventative health care 09/23/2011  . Gout 09/23/2011  . Bipolar affective disorder ([place]) 09/23/2011  . TIA (transient ischemic attack) 09/23/2011  . Chronic pain 09/23/2011  . DNR (do not resuscitate) 09/23/2011  . CVA (cerebral infarction) 09/23/2011  . 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 History of alcohol abuse 09/23/2011  . Benign prostatic hyperplasia   . Left ventricular systolic dysfunction   . Chronic systolic CHF (congestive heart failure) ([place]) 05/05/2011  . Edema 01/03/2011  . CAD (coronary artery disease) 05/18/2010  . Hyperlipidemia 05/18/2010  . Other abnormality of urination(788.69) 11/25/2009  . Other specified forms of chronic ischemic heart disease 05/19/2009  . CAD, ARTERY BYPASS GRAFT 11/18/2008   . AMI, INFERIOR WALL 09/30/2008  . Obstructive sleep apnea 09/16/2008  . SINUS TACHYCARDIA 09/02/2008  . DM (diabetes mellitus), type 2 with peripheral vascular complications ([place]) 69/62/9528  . OBESITY 09/01/2008  . Hypertensive heart disease with heart failure ([employer]) 09/01/2008  . TOBACCO ABUSE 07/31/2007  . 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    CMP     Component Value Date/Time   NA 138 02/10/2016   K 4.3 02/10/2016   CL 102 03/20/2015 1038   CO2 28 03/20/2015 1038   GLUCOSE 171 (H) 03/20/2015 1038   BUN 25 (A) 02/10/2016   CREATININE 0.9 02/10/2016   CREATININE 1.23 03/20/2015 1038   CREATININE 1.06 12/05/2014 1536   CALCIUM 9.6 03/20/2015 1038   PROT 5.8 (L) 02/01/2015 0245   ALBUMIN 2.2 (L) 02/01/2015 0245   AST 30 11/12/2015   ALT 14 11/12/2015   ALKPHOS 29 11/12/2015   BILITOT 0.4 02/01/2015 0245   GFRNONAA 55 (L) 03/20/2015 1038   GFRAA >60 03/20/2015 1038    Recent Labs  09/24/15 11/12/15 02/10/16  NA 135* 137 138  K 4.2 4.3 4.3  BUN 23* 31* 25*  CREATININE 1.3 1.2 0.9    Recent Labs  08/10/15 09/25/15 11/12/15  AST 55* 25 30  ALT 50* 14 14  ALKPHOS 34 44 29    Recent Labs  09/24/15 0840 11/12/15 06/08/16  WBC 12.5 6.0 6.7  HGB 14.3 14.3 12.8*  HCT 42 44 40*  PLT 200 203 211    Recent Labs  07/29/15 08/14/15 11/12/15  CHOL 165  --  172  LDLCALC  --  55  --   TRIG 623*  --  611*   Lab Results  Component Value Date   MICROALBUR >42.6 04/09/2016   Lab  Results  Component Value Date   TSH 0.92 11/12/2015   Lab Results  Component Value Date   HGBA1C 9 02/10/2016   Lab Results  Component Value Date   CHOL 172 11/12/2015   HDL 33 (A) 11/12/2015   LDLCALC 55 08/14/2015   LDLDIRECT 85.2 06/13/2012   TRIG 611 (A) 11/12/2015   CHOLHDL 7 06/13/2012    Significant Diagnostic Results in last 30 days:  No results found.  Assessment and Plan  LOWER EXTREMITY EDEMA- patient has been on trials of increase Lasix; will  increase Lasix to 40 mg daily permanently     Lashena Signer D. [name], MD

## 2016-06-27 ENCOUNTER — Encounter: Payer: Self-pay | Admitting: Internal Medicine

## 2016-06-27 NOTE — Assessment & Plan Note (Signed)
Chronic baseline; cont trazodone 50 mg qHS

## 2016-06-27 NOTE — Assessment & Plan Note (Signed)
Pt's mood has been stable;plan to cont lamictal 100 mg BID and zyprexa 2.5 mg qHS

## 2016-06-27 NOTE — Assessment & Plan Note (Signed)
No reported problems or infections; cont cardura 1 mg daily

## 2016-06-29 ENCOUNTER — Encounter: Payer: Self-pay | Admitting: Internal Medicine

## 2016-06-29 ENCOUNTER — Non-Acute Institutional Stay (SKILLED_NURSING_FACILITY): Payer: Medicare Other | Admitting: Internal Medicine

## 2016-06-29 DIAGNOSIS — I25708 Atherosclerosis of coronary artery bypass graft(s), unspecified, with other forms of angina pectoris: Secondary | ICD-10-CM | POA: Diagnosis not present

## 2016-06-29 DIAGNOSIS — I5022 Chronic systolic (congestive) heart failure: Secondary | ICD-10-CM | POA: Diagnosis not present

## 2016-06-29 DIAGNOSIS — I110 Hypertensive heart disease with heart failure: Secondary | ICD-10-CM | POA: Diagnosis not present

## 2016-06-29 NOTE — Progress Notes (Signed)
Location:  [facility] Room Number: [phone] Place of Service:  SNF (31)  Victor Duos, MD  Patient Care Team: Victor Duos, MD as PCP - General (Internal Medicine) [name], MD as Consulting Physician (Cardiology) [name], MD as Consulting Physician (Neurology)  Extended Emergency Contact Information Primary Emergency Contact: [name],[name] Address: [address]          [place], [place] [address] [name] of [place] Phone: [phone] Mobile Phone: [phone] Relation: Spouse Secondary Emergency Contact: [name], [name] of [place] Phone: [phone] Mobile Phone: [phone] Relation: Daughter    Allergies: Benzedrine [amphetamine]; Dexedrine [dextroamphetamine sulfate er]; Oxycodone; and Vancomycin  Chief Complaint  Patient presents with  . Medical Management of Chronic Issues    Routine Visit    HPI: Patient is 76 y.o. male who  Who is being seen for routne issues of hyerension, chronic systolic congestive heart failure and CAD.   Past Medical History:  Diagnosis Date  . 2nd degree AV block    a. s/p STJ dual chamber pacemaker 11/2014  . Acute respiratory failure ([place]) 01/26/2015  . AKI (acute kidney injury) ([place]) 01/26/2015  . AMI, INFERIOR WALL 09/30/2008   Qualifier: Diagnosis of  By: Unk Lightning, RN, BSN, Melanie    . Benign neoplasm of colon     Adenomatous polyps  . Benign prostatic hyperplasia   . Bipolar affective disorder ([place])    . Bipolar depression ([place]) 05/10/2015  . BPH (benign prostatic hypertrophy)   . CAD (coronary artery disease)    a. s/p CABG  . CAD, ARTERY BYPASS GRAFT 11/18/2008   Qualifier: Diagnosis of  By: Angelena Form, MD, Harrell Gave    . Chronic pain    . CVA (cerebral infarction)     Small left occipital  . Dementia with behavioral disturbance 02/15/2015  . Depression    . Diabetic peripheral neuropathy ([place]) 09/28/2011  . DM      . DM (diabetes mellitus), type 2 with peripheral vascular complications ([place]) 3/41/9622   Qualifier: Diagnosis of  By: Burnett Kanaris    . Gout    . History of alcohol abuse    . Hyperlipidemia    . HYPERTENSION        . Hypertensive heart disease with heart failure ([place]) 09/01/2008   Qualifier: Diagnosis of  By: Burnett Kanaris    . Insomnia 04/19/2012  . Ischemic cardiomyopathy    . Narcolepsy    . Obesity   . OBESITY 09/01/2008   Qualifier: Diagnosis of  By: Burnett Kanaris    . OBSTRUCTIVE SLEEP APNEA        . Obstructive sleep apnea 09/16/2008   Qualifier: Diagnosis of  By: [name] MD, [name]   . Pacemaker 12/12/2014  . Parkinsonism ([place]) 09/30/2015  . [facility] spotted fever   . SINUS TACHYCARDIA 09/02/2008   Qualifier: Diagnosis of  By: Caryl Comes, MD, [name]   . TIA (transient ischemic attack)    . TOBACCO ABUSE 07/31/2007   Qualifier: Diagnosis of  By: [name] MD, [name]   . Tremor 08/11/2015  . UNIVERSAL ULCERATIVE COLITIS        . Vitamin D deficiency 01/07/2015    Past Surgical History:  Procedure Laterality Date  . CARDIAC CATHETERIZATION     Triple-vessel coronary artery disease. Low-normal left ventricular systolic function with mild   anteroapical wall motion abnormality.   [name] Kitchen  CARDIAC CATHETERIZATION N/A 01/28/2015   Procedure: Temporary Pacemaker;  Surgeon: [name], MD;  Location: [facility];  Service: Cardiovascular;  Laterality: N/A;  . CORONARY ARTERY BYPASS GRAFT     Median sternotomy, extracorporeal circulation,  coronary artery bypass graft surgery x4 using a sequential left internal   mammary artery graft to the mid and distal left anterior descending, a   saphenous vein graft to diagonal branch of the left anterior descending,   and a saphenous vein graft to the obtuse marginal branch of left   circumflex coronary artery.    . EP IMPLANTABLE DEVICE N/A 12/12/2014   Procedure: Pacemaker Implant;  Surgeon: [name],  MD;  Location: [facility];  Service: Cardiovascular;  Laterality: N/A;  . EP IMPLANTABLE DEVICE N/A 01/28/2015   Procedure: PPM Generator Removal;  Surgeon: [name], MD;  Location: [facility];  Service: Cardiovascular;  Laterality: N/A;  . EP IMPLANTABLE DEVICE N/A 03/20/2015   Procedure: Pacemaker Implant;  Surgeon: [name], MD;  Location: [facility];  Service: Cardiovascular;  Laterality: N/A;  . FLEXIBLE SIGMOIDOSCOPY  01/17/2012   Procedure: FLEXIBLE SIGMOIDOSCOPY;  Surgeon: Lafayette Dragon, MD;  Location: [facility];  Service: Endoscopy;  Laterality: N/A;  . MULTIPLE EXTRACTIONS WITH ALVEOLOPLASTY  12/01/2011   Procedure: MULTIPLE EXTRACION WITH ALVEOLOPLASTY;  Surgeon: Lenn Cal, DDS;  Location: [facility];  Service: Oral Surgery;  Laterality: N/A;  Extraction of tooth #'s 3,4,5,6,7,8,9,10,11,12,13,14,15,16,17,20,21,22,23,24,25,26,27,28,29,30,31,32 with alveoloplasty and bilateral mandibular lingual tori  . TONSILECTOMY, ADENOIDECTOMY, BILATERAL MYRINGOTOMY AND TUBES  1948    Allergies as of 06/29/2016      Reactions   Benzedrine [amphetamine] Nausea Only   Dexedrine [dextroamphetamine Sulfate Er] Other (See Comments)   agitation   Oxycodone Nausea And Vomiting   Vancomycin    Unknown - per [facility]      Medication List       Accurate as of 06/29/16 11:59 PM. Always use your most recent med list.          acetaminophen 650 MG suppository Commonly known as:  TYLENOL Place 650 mg rectally every 6 (six) hours as needed for fever. For fever above 100F   aspirin 81 MG chewable tablet Chew 81 mg by mouth at bedtime.   BIOFREEZE 4 % Gel Generic drug:  Menthol (Topical Analgesic) as directed. Apply to right elbow twice daily as needed for pain.   carbidopa-levodopa 25-100 MG tablet Commonly known as:  SINEMET IR Take 2 tablets by mouth 3 (three) times daily with meals.   carboxymethylcellulose 0.5 % Soln Commonly known as:  REFRESH  PLUS Place 1 drop into both eyes daily as needed.   carvedilol 3.125 MG tablet Commonly known as:  COREG Take 3.125 mg by mouth 2 (two) times daily with a meal. Hold for SBP less than 105   clonazePAM 0.5 MG tablet Commonly known as:  KLONOPIN Take 0.5 mg by mouth at bedtime.   docusate sodium 100 MG capsule Commonly known as:  COLACE Take 100 mg by mouth daily. Hold for > 2 loose stools in a day   donepezil 10 MG tablet Commonly known as:  ARICEPT Take 10 mg by mouth at bedtime.   doxazosin 1 MG tablet Commonly known as:  CARDURA Take 1 mg by mouth at bedtime.   feeding supplement (PRO-STAT SUGAR FREE 64) Liqd Take 30 mLs by mouth 2 (two) times daily.   fenofibrate 145 MG tablet Commonly  known as:  TRICOR Take 145 mg by mouth daily. With a meal   Fish Oil 1000 MG Caps Take 2 capsules by mouth 2 (two) times daily.   furosemide 20 MG tablet Commonly known as:  LASIX Take 40 mg by mouth daily.   gabapentin 400 MG capsule Commonly known as:  NEURONTIN 400 mg 3 (three) times daily. Joshua 100 UNIT/ML KiwkPen Generic drug:  insulin lispro Inject 5 Units into the skin 3 (three) times daily. Hold for CBG  < 200. ( to be given in addition to sliding scale) Check FSBS before meals and at bedtime. SSI 201-25 4 units, 251-300 6 units, 301-350 8 units, 351-400 10 units   ketoconazole 2 % cream Commonly known as:  NIZORAL Apply 1 application topically as directed. Apply to face/neck as needed   lamoTRIgine 25 MG tablet Commonly known as:  LAMICTAL Take 25 mg by mouth daily. give one tab with Lamictal 100 mg to equal 125 mg daily   lamoTRIgine 100 MG tablet Commonly known as:  LAMICTAL Take 100 mg by mouth daily. Give 100 mg tablet along with 25 mg tablet to equal 125 mg total   LEVEMIR FLEXTOUCH 100 UNIT/ML Pen Generic drug:  Insulin Detemir Inject 40 Units into the skin daily.   magnesium oxide 400 MG tablet Commonly known as:  MAG-OX Take 400 mg by  mouth daily.   metFORMIN 1000 MG tablet Commonly known as:  GLUCOPHAGE Take 1,000 mg by mouth 2 (two) times daily.   multivitamin tablet Take 1 tablet by mouth daily.   nitroGLYCERIN 0.4 MG SL tablet Commonly known as:  NITROSTAT Place 0.4 mg under the tongue every 5 (five) minutes as needed for chest pain.   OLANZapine 2.5 MG tablet Commonly known as:  ZYPREXA Take 2.5 mg by mouth at bedtime.   OXYGEN Inhale 2 L into the lungs continuous. To keep spo2 above 90%   polyethylene glycol packet Commonly known as:  MIRALAX / GLYCOLAX Take 17 g by mouth daily.   pravastatin 40 MG tablet Commonly known as:  PRAVACHOL Take 40 mg by mouth at bedtime.   ROBAFEN 100 MG/5ML syrup Generic drug:  guaifenesin Take 100 mg by mouth every 8 (eight) hours as needed for cough.   traMADol 50 MG tablet Commonly known as:  ULTRAM Take 50 mg by mouth as directed. Every shift for pain   traZODone 50 MG tablet Commonly known as:  DESYREL Take 50 mg by mouth at bedtime.   Vitamin D (Ergocalciferol) 50000 units Caps capsule Commonly known as:  DRISDOL Take 50,000 Units by mouth every 30 (thirty) days.   Vitamin D3 50000 units Caps Take 1 capsule by mouth daily.   zinc sulfate 220 (50 Zn) MG capsule Take 220 mg by mouth daily. Stop date 07/06/16       Meds ordered this encounter  Medications  . Cholecalciferol (VITAMIN D3) 50000 units CAPS    Sig: Take 1 capsule by mouth daily.    Immunization History  Administered Date(s) Administered  . PPD Test 03/20/2013    Social History  Substance Use Topics  . Smoking status: Former Smoker    Packs/day: 2.00    Years: 50.00    Quit date: 08/18/2014  . Smokeless tobacco: Former Systems developer  . Alcohol use No     Comment: quit 2003    Review of Systems  DATA OBTAINED: from patient, nurse GENERAL:  no fevers, fatigue, appetite changes SKIN: No itching, rash  HEENT: No complaint RESPIRATORY: No cough, wheezing, SOB CARDIAC: No chest pain,  palpitations, lower extremity edema  GI: No abdominal pain, No N/V/D or constipation, No heartburn or reflux  GU: No dysuria, frequency or urgency, or incontinence  MUSCULOSKELETAL: No unrelieved bone/joint pain NEUROLOGIC: No headache, dizziness  PSYCHIATRIC: No overt anxiety or sadness  Vitals:   06/29/16 1208  BP: 117/76  Pulse: 88  Resp: 16  Temp: 97.5 F (36.4 C)   Body mass index is 33.48 kg/m. Physical Exam  GENERAL APPEARANCE: Alert, min conversant, No acute distress ; obese whieal SKIN: No diaphoresis rash HEENT: Unremarkable RESPIRATORY: Breathing is even, unlabored. Lung sounds are clear   CARDIOVASCULAR: Heart RRR no murmurs, rubs or gallops. No peripheral edema  GASTROINTESTINAL: Abdomen is soft, non-tender, not distended w/ normal bowel sounds.  GENITOURINARY: Bladder non tender, not distended  MUSCULOSKELETAL: No abnormal joints or musculature NEUROLOGIC: Cranial nerves 2-12 grossly intact. Moves all extremities PSYCHIATRIC: Mood and affect  With dementia, no behavioral issues  Patient Active Problem List   Diagnosis Date Noted  . Aspiration pneumonia ([place]) 09/30/2015  . Parkinsonism ([place]) 09/30/2015  . Tremor 08/11/2015  . Suicidal ideation 05/10/2015  . Bipolar depression ([place]) 05/10/2015  . Complete heart block ([place]) 03/20/2015  . Bradycardia 03/14/2015  . Dementia with behavioral disturbance 02/15/2015  . Staphylococcus aureus bacteremia with sepsis ([place]) 01/28/2015  . Infection of pacemaker pocket ([place]) 01/28/2015  . Acute respiratory failure ([place]) 01/26/2015  . AKI (acute kidney injury) ([place]) 01/26/2015  . Acute encephalopathy 01/26/2015  . Altered mental status   . Vitamin D deficiency 01/07/2015  . Mobitz type 2 second degree atrioventricular block 12/12/2014  . Pacemaker 12/12/2014  . Symptomatic bradycardia 12/03/2014  . Lower back pain 06/12/2012  . Right hip pain 06/12/2012  . Intertrigo 05/09/2012  . Insomnia 04/19/2012  . General  weakness 03/07/2012  . 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 Diarrhea 01/17/2012  . Pyelonephritis 01/15/2012  . Diabetic peripheral neuropathy ([place]) 09/28/2011  . Urinary incontinence 09/28/2011  . Preventative health care 09/23/2011  . Gout 09/23/2011  . Bipolar affective disorder ([place]) 09/23/2011  . TIA (transient ischemic attack) 09/23/2011  . Chronic pain 09/23/2011  . DNR (do not resuscitate) 09/23/2011  . CVA (cerebral infarction) 09/23/2011  . 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 History of alcohol abuse 09/23/2011  . Benign prostatic hyperplasia   . Left ventricular systolic dysfunction   . Chronic systolic CHF (congestive heart failure) ([place]) 05/05/2011  . Edema 01/03/2011  . CAD (coronary artery disease) 05/18/2010  . Hyperlipidemia 05/18/2010  . Other abnormality of urination(788.69) 11/25/2009  . Other specified forms of chronic ischemic heart disease 05/19/2009  . CAD, ARTERY BYPASS GRAFT 11/18/2008  . AMI, INFERIOR WALL 09/30/2008  . Obstructive sleep apnea 09/16/2008  . SINUS TACHYCARDIA 09/02/2008  . DM (diabetes mellitus), type 2 with peripheral vascular complications ([place]) 86/76/7209  . OBESITY 09/01/2008  . Hypertensive heart disease with heart failure ([place]) 09/01/2008  . TOBACCO ABUSE 07/31/2007  . 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    CMP     Component Value Date/Time   NA 141 07/22/2016   K 3.6 07/22/2016   CL 102 03/20/2015 1038   CO2 28 03/20/2015 1038   GLUCOSE 171 (H) 03/20/2015 1038   BUN 19 07/22/2016   CREATININE 1.0 07/22/2016   CREATININE 1.23 03/20/2015 1038   CREATININE 1.06 12/05/2014 1536   CALCIUM 9.6 03/20/2015 1038   PROT 5.8 (L) 02/01/2015 0245   ALBUMIN 2.2 (L) 02/01/2015  0245   AST 14 06/08/2016   ALT 16 06/08/2016   ALKPHOS 24 (A) 06/08/2016   BILITOT 0.4 02/01/2015 0245   GFRNONAA 55 (L) 03/20/2015 1038   GFRAA >60 03/20/2015 1038    Recent Labs  02/10/16 06/08/16 07/22/16  NA 138 139 141  K  4.3 4.0 3.6  BUN 25* 18 19  CREATININE 0.9 0.8 1.0    Recent Labs  09/25/15 11/12/15 06/08/16  AST 25 30 14   ALT 14 14 16   ALKPHOS 44 29 24*    Recent Labs  09/24/15 0840 11/12/15 06/08/16  WBC 12.5 6.0 6.7  6.7  HGB 14.3 14.3 12.8*  12.8*  HCT 42 44 40*  40*  PLT 200 203 211  211    Recent Labs  08/14/15 11/12/15 06/09/16  CHOL  --  172 134  LDLCALC 55  --  42  TRIG  --  611* 295*   Lab Results  Component Value Date   MICROALBUR >42.6 04/09/2016   Lab Results  Component Value Date   TSH 1.60 06/09/2016   Lab Results  Component Value Date   HGBA1C 8 06/08/2016   Lab Results  Component Value Date   CHOL 134 06/09/2016   HDL 33 (A) 06/09/2016   LDLCALC 42 06/09/2016   LDLDIRECT 85.2 06/13/2012   TRIG 295 (A) 06/09/2016   CHOLHDL 7 06/13/2012    Significant Diagnostic Results in last 30 days:  No results found.  Assessment and Plan  Hypertensive heart disease with heart failure (HCC) Controlled; plan to continue Cardura 1 mg by mouth daily, Coreg 3.125 mg by mouth twice a day and Lasix 40 mg by mouth daily  Chronic systolic CHF (congestive heart failure) (HCC) Chronic and stable without exacerbation; plan to continue Lasix 40 mg by mouth daily Coreg 3.125 mg by mouth daily  CAD (coronary artery disease) No reported episodes of chest pain or chest pain equivalent; plan to continue Coreg 3.125 mg by mouth twice a day, ASA 81 mg by mouth daily; patient is on TriCor and omega-3 fatty acids    [name]. Victor Coil, MD

## 2016-07-19 ENCOUNTER — Ambulatory Visit (INDEPENDENT_AMBULATORY_CARE_PROVIDER_SITE_OTHER): Payer: Medicare Other | Admitting: *Deleted

## 2016-07-19 DIAGNOSIS — I442 Atrioventricular block, complete: Secondary | ICD-10-CM

## 2016-07-19 NOTE — Progress Notes (Signed)
Remote pacemaker transmission.   

## 2016-07-22 ENCOUNTER — Encounter: Payer: Self-pay | Admitting: Cardiology

## 2016-07-22 LAB — BASIC METABOLIC PANEL
BUN: 19 (ref 4–21)
Creatinine: 1 (ref 0.6–1.3)
Glucose: 223
POTASSIUM: 3.6 (ref 3.4–5.3)
Sodium: 141 (ref 137–147)

## 2016-07-27 ENCOUNTER — Other Ambulatory Visit: Payer: Self-pay

## 2016-07-30 ENCOUNTER — Encounter: Payer: Self-pay | Admitting: Internal Medicine

## 2016-08-01 ENCOUNTER — Encounter: Payer: Self-pay | Admitting: Internal Medicine

## 2016-08-01 ENCOUNTER — Non-Acute Institutional Stay (SKILLED_NURSING_FACILITY): Payer: Medicare Other | Admitting: Internal Medicine

## 2016-08-01 DIAGNOSIS — E1151 Type 2 diabetes mellitus with diabetic peripheral angiopathy without gangrene: Secondary | ICD-10-CM | POA: Diagnosis not present

## 2016-08-01 DIAGNOSIS — E782 Mixed hyperlipidemia: Secondary | ICD-10-CM | POA: Diagnosis not present

## 2016-08-01 DIAGNOSIS — G218 Other secondary parkinsonism: Secondary | ICD-10-CM

## 2016-08-01 NOTE — Progress Notes (Signed)
Location:  [facility] Room Number: [place] of Service:  SNF ([phone])  [name], MD  Patient Care Team: [name], MD as PCP - General (Internal Medicine) [name], MD as Consulting Physician (Cardiology) [name], MD as Consulting Physician (Neurology)  Extended Emergency Contact Information Primary Emergency Contact: [name],[name] Address: [address]          [place], [place] [address] [name] of [place] Phone: [phone] Mobile Phone: [phone] Relation: Spouse Secondary Emergency Contact: [name], Jolly [name] of [place] Phone: [phone] Mobile Phone: [phone] Relation: Daughter    Allergies: Benzedrine [amphetamine]; Dexedrine [dextroamphetamine sulfate er]; Oxycodone; and Vancomycin  Chief Complaint  Patient presents with  . Medical Management of Chronic Issues    routine visit    HPI: Patient is 76 y.o. male who Is being seen for routine issues of hyperlipidemia, Parkinson's disease, and diabetes mellitus type 2.  Past Medical History:  Diagnosis Date  . 2nd degree AV block    a. s/p STJ dual chamber pacemaker 11/2014  . Acute respiratory failure ([place]) 01/26/2015  . AKI (acute kidney injury) ([place]) 01/26/2015  . AMI, INFERIOR WALL 09/30/2008   Qualifier: Diagnosis of  By: Unk Lightning, RN, BSN, Melanie    . Benign neoplasm of colon     Adenomatous polyps  . Benign prostatic hyperplasia   . Bipolar affective disorder ([place])    . Bipolar depression ([place]) 05/10/2015  . BPH (benign prostatic hypertrophy)   . CAD (coronary artery disease)    a. s/p CABG  . CAD, ARTERY BYPASS GRAFT 11/18/2008   Qualifier: Diagnosis of  By: Angelena Form, MD, Harrell Gave    . Chronic pain    . CVA (cerebral infarction)     Small left occipital  . Dementia with behavioral disturbance 02/15/2015  . Depression    . Diabetic peripheral neuropathy ([place]) 09/28/2011  . DM          . DM (diabetes mellitus), type 2 with peripheral vascular complications ([place]) 6/64/4034   Qualifier: Diagnosis of  By: Burnett Kanaris    . Gout    . History of alcohol abuse    . Hyperlipidemia    . HYPERTENSION        . Hypertensive heart disease with heart failure ([place]) 09/01/2008   Qualifier: Diagnosis of  By: Burnett Kanaris    . Insomnia 04/19/2012  . Ischemic cardiomyopathy    . Narcolepsy    . Obesity   . OBESITY 09/01/2008   Qualifier: Diagnosis of  By: Burnett Kanaris    . OBSTRUCTIVE SLEEP APNEA        . Obstructive sleep apnea 09/16/2008   Qualifier: Diagnosis of  By: [name] MD, [name]   . Pacemaker 12/12/2014  . Parkinsonism ([place]) 09/30/2015  . [facility] spotted fever   . SINUS TACHYCARDIA 09/02/2008   Qualifier: Diagnosis of  By: Caryl Comes, MD, [name]   . TIA (transient ischemic attack)    . TOBACCO ABUSE 07/31/2007   Qualifier: Diagnosis of  By: [name] MD, [name]   . Tremor 08/11/2015  . UNIVERSAL ULCERATIVE COLITIS        . Vitamin D deficiency 01/07/2015    Past Surgical History:  Procedure Laterality Date  . CARDIAC CATHETERIZATION     Triple-vessel coronary artery disease. Low-normal left ventricular systolic function with mild   anteroapical wall motion abnormality.   [name] Kitchen  CARDIAC CATHETERIZATION N/A 01/28/2015   Procedure: Temporary Pacemaker;  Surgeon: [name], MD;  Location: [facility];  Service: Cardiovascular;  Laterality: N/A;  . CORONARY ARTERY BYPASS GRAFT     Median sternotomy, extracorporeal circulation,  coronary artery bypass graft surgery x4 using a sequential left internal   mammary artery graft to the mid and distal left anterior descending, a   saphenous vein graft to diagonal branch of the left anterior descending,   and a saphenous vein graft to the obtuse marginal branch of left   circumflex coronary artery.    . EP IMPLANTABLE DEVICE N/A 12/12/2014   Procedure: Pacemaker Implant;  Surgeon: [name],  MD;  Location: [facility];  Service: Cardiovascular;  Laterality: N/A;  . EP IMPLANTABLE DEVICE N/A 01/28/2015   Procedure: PPM Generator Removal;  Surgeon: [name], MD;  Location: [facility];  Service: Cardiovascular;  Laterality: N/A;  . EP IMPLANTABLE DEVICE N/A 03/20/2015   Procedure: Pacemaker Implant;  Surgeon: [name], MD;  Location: [facility];  Service: Cardiovascular;  Laterality: N/A;  . FLEXIBLE SIGMOIDOSCOPY  01/17/2012   Procedure: FLEXIBLE SIGMOIDOSCOPY;  Surgeon: Lafayette Dragon, MD;  Location: [facility];  Service: Endoscopy;  Laterality: N/A;  . MULTIPLE EXTRACTIONS WITH ALVEOLOPLASTY  12/01/2011   Procedure: MULTIPLE EXTRACION WITH ALVEOLOPLASTY;  Surgeon: Lenn Cal, DDS;  Location: [facility];  Service: Oral Surgery;  Laterality: N/A;  Extraction of tooth #'s 3,4,5,6,7,8,9,10,11,12,13,14,15,16,17,20,21,22,23,24,25,26,27,28,29,30,31,32 with alveoloplasty and bilateral mandibular lingual tori  . TONSILECTOMY, ADENOIDECTOMY, BILATERAL MYRINGOTOMY AND TUBES  1948    Allergies as of 08/01/2016      Reactions   Benzedrine [amphetamine] Nausea Only   Dexedrine [dextroamphetamine Sulfate Er] Other (See Comments)   agitation   Oxycodone Nausea And Vomiting   Vancomycin    Unknown - per [facility]      Medication List       Accurate as of 08/01/16 11:59 PM. Always use your most recent med list.          acetaminophen 650 MG suppository Commonly known as:  TYLENOL Place 650 mg rectally every 6 (six) hours as needed for fever. For fever above 100F   aspirin 81 MG chewable tablet Chew 81 mg by mouth at bedtime.   BIOFREEZE 4 % Gel Generic drug:  Menthol (Topical Analgesic) as directed. Apply to right elbow twice daily as needed for pain.   carbidopa-levodopa 25-100 MG tablet Commonly known as:  SINEMET IR Take 2 tablets by mouth 3 (three) times daily with meals.   carboxymethylcellulose 0.5 % Soln Commonly known as:  REFRESH  PLUS Place 1 drop into both eyes daily as needed.   carvedilol 3.125 MG tablet Commonly known as:  COREG Take 3.125 mg by mouth 2 (two) times daily with a meal. Hold for SBP less than 105   clonazePAM 0.5 MG tablet Commonly known as:  KLONOPIN Take 0.5 mg by mouth at bedtime.   docusate sodium 100 MG capsule Commonly known as:  COLACE Take 100 mg by mouth daily. Hold for > 2 loose stools in a day   donepezil 10 MG tablet Commonly known as:  ARICEPT Take 10 mg by mouth at bedtime.   doxazosin 1 MG tablet Commonly known as:  CARDURA Take 1 mg by mouth at bedtime.   feeding supplement (PRO-STAT SUGAR FREE 64) Liqd Take 30 mLs by mouth 2 (two) times daily.   fenofibrate 145 MG tablet Commonly  known as:  TRICOR Take 145 mg by mouth daily. With a meal   Fish Oil 1000 MG Caps Take 2 capsules by mouth 2 (two) times daily.   furosemide 20 MG tablet Commonly known as:  LASIX Take 40 mg by mouth daily.   gabapentin 400 MG capsule Commonly known as:  NEURONTIN 400 mg 3 (three) times daily. [place] 100 UNIT/ML KiwkPen Generic drug:  insulin lispro Inject 5 Units into the skin 3 (three) times daily. Hold for CBG  < 200. ( to be given in addition to sliding scale) Check FSBS before meals and at bedtime. SSI 201-25 4 units, 251-300 6 units, 301-350 8 units, 351-400 10 units   ketoconazole 2 % cream Commonly known as:  NIZORAL Apply 1 application topically as directed. Apply to face/neck as needed   lamoTRIgine 25 MG tablet Commonly known as:  LAMICTAL Take 25 mg by mouth daily. give one tab with Lamictal 100 mg to equal 125 mg daily   lamoTRIgine 100 MG tablet Commonly known as:  LAMICTAL Take 100 mg by mouth daily. Give 100 mg tablet along with 25 mg tablet to equal 125 mg total   LEVEMIR FLEXTOUCH 100 UNIT/ML Pen Generic drug:  Insulin Detemir Inject 45 Units into the skin daily.   magnesium oxide 400 MG tablet Commonly known as:  MAG-OX Take 400 mg by  mouth daily.   metFORMIN 1000 MG tablet Commonly known as:  GLUCOPHAGE Take 1,000 mg by mouth 2 (two) times daily.   multivitamin tablet Take 1 tablet by mouth daily.   nitroGLYCERIN 0.4 MG SL tablet Commonly known as:  NITROSTAT Place 0.4 mg under the tongue every 5 (five) minutes as needed for chest pain.   OLANZapine 2.5 MG tablet Commonly known as:  ZYPREXA Take 2.5 mg by mouth at bedtime.   OXYGEN Inhale 2 L into the lungs continuous. To keep spo2 above 90%   polyethylene glycol packet Commonly known as:  MIRALAX / GLYCOLAX Take 17 g by mouth daily.   pravastatin 40 MG tablet Commonly known as:  PRAVACHOL Take 40 mg by mouth at bedtime.   ROBAFEN 100 MG/5ML syrup Generic drug:  guaifenesin Take 100 mg by mouth every 8 (eight) hours as needed for cough.   traMADol 50 MG tablet Commonly known as:  ULTRAM Take 50 mg by mouth as directed. Every shift for pain   traZODone 50 MG tablet Commonly known as:  DESYREL Take 50 mg by mouth at bedtime.   Vitamin D (Ergocalciferol) 50000 units Caps capsule Commonly known as:  DRISDOL Take 50,000 Units by mouth every 30 (thirty) days.   Vitamin D3 50000 units Caps Take 1 capsule by mouth daily.   zinc sulfate 220 (50 Zn) MG capsule Take 220 mg by mouth daily. Stop date 07/06/16       No orders of the defined types were placed in this encounter.   Immunization History  Administered Date(s) Administered  . PPD Test 03/20/2013    Social History  Substance Use Topics  . Smoking status: Former Smoker    Packs/day: 2.00    Years: 50.00    Quit date: 08/18/2014  . Smokeless tobacco: Former Systems developer  . Alcohol use No     Comment: quit 2003    Review of Systems  DATA OBTAINED: from patient, nurse GENERAL:  no fevers, fatigue, appetite changes SKIN: No itching, rash HEENT: No complaint RESPIRATORY: No cough, wheezing, SOB CARDIAC: No chest pain, palpitations, lower  extremity edema  GI: No abdominal pain, No N/V/D  or constipation, No heartburn or reflux  GU: No dysuria, frequency or urgency, or incontinence  MUSCULOSKELETAL: No unrelieved bone/joint pain NEUROLOGIC: No headache, dizziness  PSYCHIATRIC: No overt anxiety or sadness  Vitals:   08/01/16 1140  BP: 117/76  Pulse: 84  Resp: 18  Temp: 98.7 F (37.1 C)   Body mass index is 32.64 kg/m. Physical Exam  GENERAL APPEARANCE: Alert, conversant, No acute distress;Obese white male very unmotivated  SKIN: No diaphoresis rash HEENT: Unremarkable RESPIRATORY: Breathing is even, unlabored. Lung sounds are clear   CARDIOVASCULAR: Heart RRR no murmurs, rubs or gallops. No peripheral edema  GASTROINTESTINAL: Abdomen is soft, non-tender, not distended w/ normal bowel sounds.  GENITOURINARY: Bladder non tender, not distended  MUSCULOSKELETAL: No abnormal joints or musculature NEUROLOGIC: Cranial nerves 2-12 grossly intact. Moves all extremities PSYCHIATRIC: Mood and affect with dementia, no behavioral issues  Patient Active Problem List   Diagnosis Date Noted  . Aspiration pneumonia ([place]) 09/30/2015  . Parkinsonism ([place]) 09/30/2015  . Tremor 08/11/2015  . Suicidal ideation 05/10/2015  . Bipolar depression ([place]) 05/10/2015  . Complete heart block ([place]) 03/20/2015  . Bradycardia 03/14/2015  . Dementia with behavioral disturbance 02/15/2015  . Staphylococcus aureus bacteremia with sepsis ([place]) 01/28/2015  . Infection of pacemaker pocket ([place]) 01/28/2015  . Acute respiratory failure ([place]) 01/26/2015  . AKI (acute kidney injury) ([place]) 01/26/2015  . Acute encephalopathy 01/26/2015  . Altered mental status   . Vitamin D deficiency 01/07/2015  . [place] type 2 second degree atrioventricular block 12/12/2014  . Pacemaker 12/12/2014  . Symptomatic bradycardia 12/03/2014  . Lower back pain 06/12/2012  . Right hip pain 06/12/2012  . Intertrigo 05/09/2012  . Insomnia 04/19/2012  . General weakness 03/07/2012  . 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 Diarrhea 01/17/2012  . Pyelonephritis 01/15/2012  . Diabetic peripheral neuropathy ([place]) 09/28/2011  . Urinary incontinence 09/28/2011  . Preventative health care 09/23/2011  . Gout 09/23/2011  . Bipolar affective disorder ([place]) 09/23/2011  . TIA (transient ischemic attack) 09/23/2011  . Chronic pain 09/23/2011  . DNR (do not resuscitate) 09/23/2011  . CVA (cerebral infarction) 09/23/2011  . 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 History of alcohol abuse 09/23/2011  . Benign prostatic hyperplasia   . Left ventricular systolic dysfunction   . Chronic systolic CHF (congestive heart failure) ([place]) 05/05/2011  . Edema 01/03/2011  . CAD (coronary artery disease) 05/18/2010  . Hyperlipidemia 05/18/2010  . Other abnormality of urination(788.69) 11/25/2009  . Other specified forms of chronic ischemic heart disease 05/19/2009  . CAD, ARTERY BYPASS GRAFT 11/18/2008  . AMI, INFERIOR WALL 09/30/2008  . Obstructive sleep apnea 09/16/2008  . SINUS TACHYCARDIA 09/02/2008  . DM (diabetes mellitus), type 2 with peripheral vascular complications ([place]) 86/57/8469  . OBESITY 09/01/2008  . Hypertensive heart disease with heart failure ([place]) 09/01/2008  . TOBACCO ABUSE 07/31/2007  . 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    CMP     Component Value Date/Time   NA 141 07/22/2016   K 3.6 07/22/2016   CL 102 03/20/2015 1038   CO2 28 03/20/2015 1038   GLUCOSE 171 (H) 03/20/2015 1038   BUN 19 07/22/2016   CREATININE 1.0 07/22/2016   CREATININE 1.23 03/20/2015 1038   CREATININE 1.06 12/05/2014 1536   CALCIUM 9.6 03/20/2015 1038   PROT 5.8 (L) 02/01/2015 0245   ALBUMIN 2.2 (L) 02/01/2015 0245   AST 14 06/08/2016   ALT 16 06/08/2016   ALKPHOS  24 (A) 06/08/2016   BILITOT 0.4 02/01/2015 0245   GFRNONAA 55 (L) 03/20/2015 1038   GFRAA >60 03/20/2015 1038    Recent Labs  02/10/16 06/08/16 07/22/16  NA 138 139 141  K 4.3 4.0 3.6  BUN 25* 18 19  CREATININE 0.9  0.8 1.0    Recent Labs  09/25/15 11/12/15 06/08/16  AST 25 30 14   ALT 14 14 16   ALKPHOS 44 29 24*    Recent Labs  09/24/15 0840 11/12/15 06/08/16  WBC 12.5 6.0 6.7  6.7  HGB 14.3 14.3 12.8*  12.8*  HCT 42 44 40*  40*  PLT 200 203 211  211    Recent Labs  11/12/15 06/09/16  CHOL 172 134  LDLCALC  --  42  TRIG 611* 295*   Lab Results  Component Value Date   MICROALBUR >42.6 04/09/2016   Lab Results  Component Value Date   TSH 1.60 06/09/2016   Lab Results  Component Value Date   HGBA1C 8 06/08/2016   Lab Results  Component Value Date   CHOL 134 06/09/2016   HDL 33 (A) 06/09/2016   LDLCALC 42 06/09/2016   LDLDIRECT 85.2 06/13/2012   TRIG 295 (A) 06/09/2016   CHOLHDL 7 06/13/2012    Significant Diagnostic Results in last 30 days:  No results found.  Assessment and Plan  Hyperlipidemia Most recent LDL is 42; HDL 33; and glycerides 295 which is an improvement over the last lab where they were 622; plan to continue current medications of Pravachol 40 mg by mouth daily, fenofibrate 145 mg by mouth daily and omega-3 fatty acids 1 g by mouth daily  Parkinsonism (HCC) Stable on Sinemet 25-100 by mouth 3 times a day; plan to continue current regimen; tremors continue to be improved on Sinemet  DM (diabetes mellitus), type 2 with peripheral vascular complications (HCC) A1O ordered in May was 8 which is a miracle, which is improved from prior which was 9; maybe patient has been more compliant; plan to continue Glucophage 1000 mg by mouth twice a day, Levemir 40 units daily and Humalog 5 units with each meal; we will repeat A1c in 3 months    Stellar Gensel D. [name], MD

## 2016-08-03 ENCOUNTER — Encounter: Payer: Self-pay | Admitting: Internal Medicine

## 2016-08-03 ENCOUNTER — Non-Acute Institutional Stay (SKILLED_NURSING_FACILITY): Payer: Medicare Other

## 2016-08-03 DIAGNOSIS — Z0000 Encounter for general adult medical examination without abnormal findings: Secondary | ICD-10-CM

## 2016-08-03 NOTE — Assessment & Plan Note (Signed)
Controlled; plan to continue Cardura 1 mg by mouth daily, Coreg 3.125 mg by mouth twice a day and Lasix 40 mg by mouth daily

## 2016-08-03 NOTE — Assessment & Plan Note (Signed)
Chronic and stable without exacerbation; plan to continue Lasix 40 mg by mouth daily Coreg 3.125 mg by mouth daily

## 2016-08-03 NOTE — Progress Notes (Signed)
Subjective:   [name] is a 76 y.o. male who presents for an Initial Medicare Annual Wellness Visit at [facility]   Objective:    Today's Vitals   08/03/16 1143  BP: (!) 150/90  Pulse: 65  Temp: 98 F (36.7 C)  TempSrc: Oral  SpO2: 94%  Weight: 221 lb (100.2 kg)  Height: 5\' 9"  (1.753 m)   Body mass index is 32.64 kg/m.  Current Medications (verified) Outpatient Encounter Prescriptions as of 08/03/2016  Medication Sig  . acetaminophen (TYLENOL) 650 MG suppository Place 650 mg rectally every 6 (six) hours as needed for fever. For fever above 100F  . Amino Acids-Protein Hydrolys (FEEDING SUPPLEMENT, PRO-STAT SUGAR FREE 64,) LIQD Take 30 mLs by mouth 2 (two) times daily.  [name] Kitchen aspirin 81 MG chewable tablet Chew 81 mg by mouth at bedtime.   . carbidopa-levodopa (SINEMET IR) 25-100 MG tablet Take 2 tablets by mouth 3 (three) times daily with meals.   . carboxymethylcellulose (REFRESH PLUS) 0.5 % SOLN Place 1 drop into both eyes daily as needed.  . carvedilol (COREG) 3.125 MG tablet Take 3.125 mg by mouth 2 (two) times daily with a meal. Hold for SBP less than 105   . Cholecalciferol (VITAMIN D3) 50000 units CAPS Take 1 capsule by mouth daily.  . clonazePAM (KLONOPIN) 0.5 MG tablet Take 0.5 mg by mouth at bedtime.  . docusate sodium (COLACE) 100 MG capsule Take 100 mg by mouth daily. Hold for > 2 loose stools in a day   . donepezil (ARICEPT) 10 MG tablet Take 10 mg by mouth at bedtime.  [name] Kitchen doxazosin (CARDURA) 1 MG tablet Take 1 mg by mouth at bedtime.   . fenofibrate (TRICOR) 145 MG tablet Take 145 mg by mouth daily. With a meal  . furosemide (LASIX) 20 MG tablet Take 40 mg by mouth daily.   [name] Kitchen gabapentin (NEURONTIN) 400 MG capsule 400 mg 3 (three) times daily. 400  . guaifenesin (ROBAFEN) 100 MG/5ML syrup Take 100 mg by mouth every 8 (eight) hours as needed for cough.  . insulin lispro (HUMALOG KWIKPEN) 100 UNIT/ML KiwkPen Inject 5 Units into the skin 3 (three)  times daily. Hold for CBG  < 200. ( to be given in addition to sliding scale) Check FSBS before meals and at bedtime. SSI 201-25 4 units, 251-300 6 units, 301-350 8 units, 351-400 10 units  . ketoconazole (NIZORAL) 2 % cream Apply 1 application topically as directed. Apply to face/neck as needed   . lamoTRIgine (LAMICTAL) 100 MG tablet Take 100 mg by mouth daily. Give 100 mg tablet along with 25 mg tablet to equal 125 mg total  . lamoTRIgine (LAMICTAL) 25 MG tablet Take 25 mg by mouth daily. give one tab with Lamictal 100 mg to equal 125 mg daily  . LEVEMIR FLEXTOUCH 100 UNIT/ML Pen Inject 40 Units into the skin daily.   . magnesium oxide (MAG-OX) 400 MG tablet Take 400 mg by mouth daily.   . Menthol, Topical Analgesic, (BIOFREEZE) 4 % GEL as directed. Apply to right elbow twice daily as needed for pain.   . metFORMIN (GLUCOPHAGE) 1000 MG tablet Take 1,000 mg by mouth 2 (two) times daily.   . Multiple Vitamin (MULTIVITAMIN) tablet Take 1 tablet by mouth daily.  . nitroGLYCERIN (NITROSTAT) 0.4 MG SL tablet Place 0.4 mg under the tongue every 5 (five) minutes as needed for chest pain.  [name] Kitchen OLANZapine (ZYPREXA) 2.5 MG tablet Take 2.5 mg by mouth  at bedtime.  . Omega-3 Fatty Acids (FISH OIL) 1000 MG CAPS Take 2 capsules by mouth 2 (two) times daily.   . OXYGEN Inhale 2 L into the lungs continuous. To keep spo2 above 90%  . polyethylene glycol (MIRALAX / GLYCOLAX) packet Take 17 g by mouth daily.   . pravastatin (PRAVACHOL) 40 MG tablet Take 40 mg by mouth at bedtime.   . traMADol (ULTRAM) 50 MG tablet Take 50 mg by mouth as directed. Every shift for pain  . traZODone (DESYREL) 50 MG tablet Take 50 mg by mouth at bedtime.  . Vitamin D, Ergocalciferol, (DRISDOL) 50000 units CAPS capsule Take 50,000 Units by mouth every 30 (thirty) days.  [name] Kitchen zinc sulfate 220 (50 Zn) MG capsule Take 220 mg by mouth daily. Stop date 07/06/16   No facility-administered encounter medications on file as of 08/03/2016.      Allergies (verified) Benzedrine [amphetamine]; Dexedrine [dextroamphetamine sulfate er]; Oxycodone; and Vancomycin   History: Past Medical History:  Diagnosis Date  . 2nd degree AV block    a. s/p STJ dual chamber pacemaker 11/2014  . Acute respiratory failure ([place]) 01/26/2015  . AKI (acute kidney injury) ([place]) 01/26/2015  . AMI, INFERIOR WALL 09/30/2008   Qualifier: Diagnosis of  By: Unk Lightning, RN, BSN, Melanie    . Benign neoplasm of colon     Adenomatous polyps  . Benign prostatic hyperplasia   . Bipolar affective disorder ([place])    . Bipolar depression ([place]) 05/10/2015  . BPH (benign prostatic hypertrophy)   . CAD (coronary artery disease)    a. s/p CABG  . CAD, ARTERY BYPASS GRAFT 11/18/2008   Qualifier: Diagnosis of  By: Angelena Form, MD, Harrell Gave    . Chronic pain    . CVA (cerebral infarction)     Small left occipital  . Dementia with behavioral disturbance 02/15/2015  . Depression    . Diabetic peripheral neuropathy ([place]) 09/28/2011  . DM        . DM (diabetes mellitus), type 2 with peripheral vascular complications ([place]) 3/54/6568   Qualifier: Diagnosis of  By: Burnett Kanaris    . Gout    . History of alcohol abuse    . Hyperlipidemia    . HYPERTENSION        . Hypertensive heart disease with heart failure ([place]) 09/01/2008   Qualifier: Diagnosis of  By: Burnett Kanaris    . Insomnia 04/19/2012  . Ischemic cardiomyopathy    . Narcolepsy    . Obesity   . OBESITY 09/01/2008   Qualifier: Diagnosis of  By: Burnett Kanaris    . OBSTRUCTIVE SLEEP APNEA        . Obstructive sleep apnea 09/16/2008   Qualifier: Diagnosis of  By: [name] MD, [name]   . Pacemaker 12/12/2014  . Parkinsonism ([place]) 09/30/2015  . [facility] spotted fever   . SINUS TACHYCARDIA 09/02/2008   Qualifier: Diagnosis of  By: Caryl Comes, MD, [name]   . TIA (transient ischemic attack)    . TOBACCO ABUSE 07/31/2007   Qualifier: Diagnosis of  By: [name] MD, [name]   . Tremor 08/11/2015   . UNIVERSAL ULCERATIVE COLITIS        . Vitamin D deficiency 01/07/2015   Past Surgical History:  Procedure Laterality Date  . CARDIAC CATHETERIZATION     Triple-vessel coronary artery disease. Low-normal left ventricular systolic function with mild   anteroapical wall motion abnormality.   [name] Kitchen CARDIAC CATHETERIZATION N/A 01/28/2015   Procedure: Temporary Pacemaker;  Surgeon: [name], MD;  Location: [facility];  Service: Cardiovascular;  Laterality: N/A;  . CORONARY ARTERY BYPASS GRAFT     Median sternotomy, extracorporeal circulation,  coronary artery bypass graft surgery x4 using a sequential left internal   mammary artery graft to the mid and distal left anterior descending, a   saphenous vein graft to diagonal branch of the left anterior descending,   and a saphenous vein graft to the obtuse marginal branch of left   circumflex coronary artery.    . EP IMPLANTABLE DEVICE N/A 12/12/2014   Procedure: Pacemaker Implant;  Surgeon: [name], MD;  Location: [facility];  Service: Cardiovascular;  Laterality: N/A;  . EP IMPLANTABLE DEVICE N/A 01/28/2015   Procedure: PPM Generator Removal;  Surgeon: [name], MD;  Location: [facility];  Service: Cardiovascular;  Laterality: N/A;  . EP IMPLANTABLE DEVICE N/A 03/20/2015   Procedure: Pacemaker Implant;  Surgeon: [name], MD;  Location: [facility];  Service: Cardiovascular;  Laterality: N/A;  . FLEXIBLE SIGMOIDOSCOPY  01/17/2012   Procedure: FLEXIBLE SIGMOIDOSCOPY;  Surgeon: Lafayette Dragon, MD;  Location: [facility];  Service: Endoscopy;  Laterality: N/A;  . MULTIPLE EXTRACTIONS WITH ALVEOLOPLASTY  12/01/2011   Procedure: MULTIPLE EXTRACION WITH ALVEOLOPLASTY;  Surgeon: Lenn Cal, DDS;  Location: [facility];  Service: Oral Surgery;  Laterality: N/A;  Extraction of tooth #'s 3,4,5,6,7,8,9,10,11,12,13,14,15,16,17,20,21,22,23,24,25,26,27,28,29,30,31,32 with alveoloplasty and bilateral mandibular  lingual tori  . TONSILECTOMY, ADENOIDECTOMY, BILATERAL MYRINGOTOMY AND TUBES  1946   Family History  Problem Relation Age of Onset  . Diabetes Father   . Heart disease Father   . Stroke Father   . Heart attack Mother   . Aortic aneurysm Mother   . Alcohol abuse Other   . Arthritis Other   . Heart disease Other   . Mental illness Other   . Diabetes Other   . Alcohol abuse Other   . Arthritis Other   . Hyperlipidemia Other   . Heart disease Other   . Stroke Other   . Hypertension Other   . Diabetes Other   . Mental illness Other   . Heart disease Brother    Social History   Occupational History  . retired Ship broker Retired    Ship broker   Social History Main Topics  . Smoking status: Former Smoker    Packs/day: 2.00    Years: 50.00    Quit date: 08/18/2014  . Smokeless tobacco: Former Systems developer  . Alcohol use No     Comment: quit 2003  . Drug use: No  . Sexual activity: No   Tobacco Counseling Counseling given: Not Answered   Activities of Daily Living In your present state of health, do you have any difficulty performing the following activities: 08/03/2016  Hearing? N  Vision? N  Difficulty concentrating or making decisions? Y  Walking or climbing stairs? Y  Dressing or bathing? Y  Doing errands, shopping? Y  Preparing Food and eating ? Y  Using the Toilet? Y  In the past six months, have you accidently leaked urine? Y  Do you have problems with loss of bowel control? Y  Managing your Medications? Y  Managing your Finances? Y  Housekeeping or managing your Housekeeping? Y  Some recent data might be hidden    Immunizations and Health Maintenance Immunization History  Administered Date(s) Administered  . PPD Test 03/20/2013   There are no preventive care reminders to  display for this patient.  Patient Care Team: [name], MD as PCP - General (Internal Medicine) [name], MD as Consulting Physician (Cardiology) [name], MD as  Consulting Physician (Neurology)  Indicate any recent Medical Services you may have received from other than [facility] providers in the past year (date may be approximate).    Assessment:   This is a routine wellness examination for [name].   Hearing/Vision screen No exam data present  Dietary issues and exercise activities discussed: Current Exercise Habits: The patient does not participate in regular exercise at present, Exercise limited by: Other - see comments (parkinsons)  Goals    None     Depression Screen PHQ 2/9 Scores 08/03/2016 02/24/2015  PHQ - 2 Score 0 1    Fall Risk Fall Risk  08/03/2016 12/14/2015 09/11/2015 02/24/2015  Falls in the past year? No Yes No No  Number falls in past yr: - 2 or more - -  Injury with Fall? - No - -  Risk for fall due to : - Other (Comment) - -  Risk for fall due to (comments): - slid out of wheelchair - -    Cognitive Function:     6CIT Screen 08/03/2016  What Year? 0 points  What month? 0 points  What time? 0 points  Count back from 20 0 points  Months in reverse 4 points  Repeat phrase 4 points  Total Score 8    Screening Tests Health Maintenance  Topic Date Due  . PNA vac Low Risk Adult (1 of 2 - PCV13) 01/17/2017 (Originally 11/25/2003)  . TETANUS/TDAP  01/18/2023 (Originally 11/24/1959)  . INFLUENZA VACCINE  08/17/2016  . HEMOGLOBIN A1C  12/09/2016  . OPHTHALMOLOGY EXAM  03/15/2017  . FOOT EXAM  04/06/2017  . URINE MICROALBUMIN  04/09/2017        Plan:    I have personally reviewed and addressed the Medicare Annual Wellness questionnaire and have noted the following in the patient's chart:  A. Medical and social history B. Use of alcohol, tobacco or illicit drugs  C. Current medications and supplements D. Functional ability and status E.  Nutritional status F.  Physical activity G. Advance directives H. List of other physicians I.  Hospitalizations, surgeries, and ER visits in previous 12 months J.   [place] to include hearing, vision, cognitive, depression L. Referrals and appointments - none  In addition, I have reviewed and discussed with patient certain preventive protocols, quality metrics, and best practice recommendations. A written personalized care plan for preventive services as well as general preventive health recommendations were provided to patient.  See attached scanned questionnaire for additional information.   Signed,   Rich Reining, RN Nurse Health Advisor   Quick Notes   Health Maintenance: PNA 13 and tdap due     Abnormal Screen: 6 CIT-8     Patient Concerns: None     Nurse Concerns: None

## 2016-08-03 NOTE — Patient Instructions (Signed)
[name] Kitchen [name] , Thank you for taking time to come for your Medicare Wellness Visit. I appreciate your ongoing commitment to your health goals. Please review the following plan we discussed and let me know if I can assist you in the future.   Screening recommendations/referrals: Colonoscopy up to date, pt over age 78 Recommended yearly ophthalmology/optometry visit for glaucoma screening and checkup Recommended yearly dental visit for hygiene and checkup  Vaccinations: Influenza vaccine due 2018 fall season Pneumococcal vaccine 13 due Tdap vaccine due Shingles vaccine not in records    Advanced directives: DNR in chart, need rest of AD for chart  Conditions/risks identified: None  Next appointment: Dr. [name] makes rounds  Preventive Care 1 Years and Older, Male Preventive care refers to lifestyle choices and visits with your health care provider that can promote health and wellness. What does preventive care include?  A yearly physical exam. This is also called an annual well check.  Dental exams once or twice a year.  Routine eye exams. Ask your health care provider how often you should have your eyes checked.  Personal lifestyle choices, including:  Daily care of your teeth and gums.  Regular physical activity.  Eating a healthy diet.  Avoiding tobacco and drug use.  Limiting alcohol use.  Practicing safe sex.  Taking low doses of aspirin every day.  Taking vitamin and mineral supplements as recommended by your health care provider. What happens during an annual well check? The services and screenings done by your health care provider during your annual well check will depend on your age, overall health, lifestyle risk factors, and family history of disease. Counseling  Your health care provider may ask you questions about your:  Alcohol use.  Tobacco use.  Drug use.  Emotional well-being.  Home and relationship well-being.  Sexual  activity.  Eating habits.  History of falls.  Memory and ability to understand (cognition).  Work and work Statistician. Screening  You may have the following tests or measurements:  Height, weight, and BMI.  Blood pressure.  Lipid and cholesterol levels. These may be checked every 5 years, or more frequently if you are over 42 years old.  Skin check.  Lung cancer screening. You may have this screening every year starting at age 28 if you have a 30-pack-year history of smoking and currently smoke or have quit within the past 15 years.  Fecal occult blood test (FOBT) of the stool. You may have this test every year starting at age 16.  Flexible sigmoidoscopy or colonoscopy. You may have a sigmoidoscopy every 5 years or a colonoscopy every 10 years starting at age 47.  Prostate cancer screening. Recommendations will vary depending on your family history and other risks.  Hepatitis C blood test.  Hepatitis B blood test.  Sexually transmitted disease (STD) testing.  Diabetes screening. This is done by checking your blood sugar (glucose) after you have not eaten for a while (fasting). You may have this done every 1-3 years.  Abdominal aortic aneurysm (AAA) screening. You may need this if you are a current or former smoker.  Osteoporosis. You may be screened starting at age 63 if you are at high risk. Talk with your health care provider about your test results, treatment options, and if necessary, the need for more tests. Vaccines  Your health care provider may recommend certain vaccines, such as:  Influenza vaccine. This is recommended every year.  Tetanus, diphtheria, and acellular pertussis (Tdap, Td) vaccine. You may need  a Td booster every 10 years.  Zoster vaccine. You may need this after age 37.  Pneumococcal 13-valent conjugate (PCV13) vaccine. One dose is recommended after age 79.  Pneumococcal polysaccharide (PPSV23) vaccine. One dose is recommended after age  33. Talk to your health care provider about which screenings and vaccines you need and how often you need them. This information is not intended to replace advice given to you by your health care provider. Make sure you discuss any questions you have with your health care provider. Document Released: 01/30/2015 Document Revised: 09/23/2015 Document Reviewed: 11/04/2014 Elsevier Interactive Patient Education  2017 [place] Prevention in the Home Falls can cause injuries. They can happen to people of all ages. There are many things you can do to make your home safe and to help prevent falls. What can I do on the outside of my home?  Regularly fix the edges of walkways and driveways and fix any cracks.  Remove anything that might make you trip as you walk through a door, such as a raised step or threshold.  Trim any bushes or trees on the path to your home.  Use bright outdoor lighting.  Clear any walking paths of anything that might make someone trip, such as rocks or tools.  Regularly check to see if handrails are loose or broken. Make sure that both sides of any steps have handrails.  Any raised decks and porches should have guardrails on the edges.  Have any leaves, snow, or ice cleared regularly.  Use sand or salt on walking paths during winter.  Clean up any spills in your garage right away. This includes oil or grease spills. What can I do in the bathroom?  Use night lights.  Install grab bars by the toilet and in the tub and shower. Do not use towel bars as grab bars.  Use non-skid mats or decals in the tub or shower.  If you need to sit down in the shower, use a plastic, non-slip stool.  Keep the floor dry. Clean up any water that spills on the floor as soon as it happens.  Remove soap buildup in the tub or shower regularly.  Attach bath mats securely with double-sided non-slip rug tape.  Do not have throw rugs and other things on the floor that can make  you trip. What can I do in the bedroom?  Use night lights.  Make sure that you have a light by your bed that is easy to reach.  Do not use any sheets or blankets that are too big for your bed. They should not hang down onto the floor.  Have a firm chair that has side arms. You can use this for support while you get dressed.  Do not have throw rugs and other things on the floor that can make you trip. What can I do in the kitchen?  Clean up any spills right away.  Avoid walking on wet floors.  Keep items that you use a lot in easy-to-reach places.  If you need to reach something above you, use a strong step stool that has a grab bar.  Keep electrical cords out of the way.  Do not use floor polish or wax that makes floors slippery. If you must use wax, use non-skid floor wax.  Do not have throw rugs and other things on the floor that can make you trip. What can I do with my stairs?  Do not leave any items on the  stairs.  Make sure that there are handrails on both sides of the stairs and use them. Fix handrails that are broken or loose. Make sure that handrails are as long as the stairways.  Check any carpeting to make sure that it is firmly attached to the stairs. Fix any carpet that is loose or worn.  Avoid having throw rugs at the top or bottom of the stairs. If you do have throw rugs, attach them to the floor with carpet tape.  Make sure that you have a light switch at the top of the stairs and the bottom of the stairs. If you do not have them, ask someone to add them for you. What else can I do to help prevent falls?  Wear shoes that:  Do not have high heels.  Have rubber bottoms.  Are comfortable and fit you well.  Are closed at the toe. Do not wear sandals.  If you use a stepladder:  Make sure that it is fully opened. Do not climb a closed stepladder.  Make sure that both sides of the stepladder are locked into place.  Ask someone to hold it for you, if  possible.  Clearly mark and make sure that you can see:  Any grab bars or handrails.  First and last steps.  Where the edge of each step is.  Use tools that help you move around (mobility aids) if they are needed. These include:  Canes.  Walkers.  Scooters.  Crutches.  Turn on the lights when you go into a dark area. Replace any light bulbs as soon as they burn out.  Set up your furniture so you have a clear path. Avoid moving your furniture around.  If any of your floors are uneven, fix them.  If there are any pets around you, be aware of where they are.  Review your medicines with your doctor. Some medicines can make you feel dizzy. This can increase your chance of falling. Ask your doctor what other things that you can do to help prevent falls. This information is not intended to replace advice given to you by your health care provider. Make sure you discuss any questions you have with your health care provider. Document Released: 10/30/2008 Document Revised: 06/11/2015 Document Reviewed: 02/07/2014 Elsevier Interactive Patient Education  2017 [employer].

## 2016-08-03 NOTE — Assessment & Plan Note (Signed)
No reported episodes of chest pain or chest pain equivalent; plan to continue Coreg 3.125 mg by mouth twice a day, ASA 81 mg by mouth daily; patient is on TriCor and omega-3 fatty acids

## 2016-08-09 ENCOUNTER — Encounter: Payer: Self-pay | Admitting: Cardiology

## 2016-08-15 LAB — CUP PACEART REMOTE DEVICE CHECK
Battery Remaining Longevity: 121 mo
Brady Statistic AP VP Percent: 27 %
Brady Statistic AP VS Percent: 1 %
Brady Statistic AS VP Percent: 73 %
Brady Statistic AS VS Percent: 1 %
Brady Statistic RA Percent Paced: 25 %
Implantable Lead Implant Date: 20170303
Implantable Lead Location: 753859
Implantable Pulse Generator Implant Date: 20170303
Lead Channel Impedance Value: 540 Ohm
Lead Channel Pacing Threshold Amplitude: 0.625 V
Lead Channel Pacing Threshold Amplitude: 1.125 V
Lead Channel Pacing Threshold Pulse Width: 0.5 ms
Lead Channel Setting Pacing Amplitude: 0.875
Lead Channel Setting Pacing Pulse Width: 0.5 ms
Lead Channel Setting Sensing Sensitivity: 4 mV
MDC IDC LEAD IMPLANT DT: 20170303
MDC IDC LEAD LOCATION: 753860
MDC IDC MSMT BATTERY REMAINING PERCENTAGE: 95.5 %
MDC IDC MSMT BATTERY VOLTAGE: 3.01 V
MDC IDC MSMT LEADCHNL RA IMPEDANCE VALUE: 450 Ohm
MDC IDC MSMT LEADCHNL RA PACING THRESHOLD PULSEWIDTH: 0.5 ms
MDC IDC MSMT LEADCHNL RA SENSING INTR AMPL: 5 mV
MDC IDC PG SERIAL: 7888922
MDC IDC SESS DTM: 20180703060014
MDC IDC SET LEADCHNL RA PACING AMPLITUDE: 2.5 V
MDC IDC STAT BRADY RV PERCENT PACED: 99 %

## 2016-08-31 ENCOUNTER — Encounter: Payer: Self-pay | Admitting: Internal Medicine

## 2016-08-31 ENCOUNTER — Non-Acute Institutional Stay (SKILLED_NURSING_FACILITY): Payer: Medicare Other | Admitting: Internal Medicine

## 2016-08-31 DIAGNOSIS — E1165 Type 2 diabetes mellitus with hyperglycemia: Secondary | ICD-10-CM

## 2016-08-31 DIAGNOSIS — E1151 Type 2 diabetes mellitus with diabetic peripheral angiopathy without gangrene: Secondary | ICD-10-CM

## 2016-08-31 DIAGNOSIS — Z794 Long term (current) use of insulin: Secondary | ICD-10-CM

## 2016-08-31 NOTE — Progress Notes (Signed)
Location:  [facility] Room Number: [phone] Place of Service:  SNF (31)  [name], MD  Patient Care Team: [name], MD as PCP - General (Internal Medicine) [name], MD as Consulting Physician (Cardiology) [name], MD as Consulting Physician (Neurology)  Extended Emergency Contact Information Primary Emergency Contact: [name],[name] Address: [address]          [place], [place] [address] [name] of [place] Phone: [phone] Mobile Phone: [phone] Relation: Spouse Secondary Emergency Contact: [name],  [name] of [place] Phone: [phone] Mobile Phone: [phone] Relation: Daughter    Allergies: Benzedrine [amphetamine]; Dexedrine [dextroamphetamine sulfate er]; Oxycodone; and Vancomycin  Chief Complaint  Patient presents with  . Medical Management of Chronic Issues    Routine visit    HPI: Patient is 76 y.o. male who Nursing asked me to see today because patient's blood sugars have been running in the high 300s low 400s. Patient is known to be extremely noncompliant. Patient with dementia so unable to get reliable history from him. Nursing reports no change in mental status and change in fluid intake or urination.  Past Medical History:  Diagnosis Date  . 2nd degree AV block    a. s/p STJ dual chamber pacemaker 11/2014  . Acute respiratory failure ([place]) 01/26/2015  . AKI (acute kidney injury) ([place]) 01/26/2015  . AMI, INFERIOR WALL 09/30/2008   Qualifier: Diagnosis of  By: Unk Lightning, RN, BSN, Melanie    . Benign neoplasm of colon     Adenomatous polyps  . Benign prostatic hyperplasia   . Bipolar affective disorder ([place])    . Bipolar depression ([place]) 05/10/2015  . BPH (benign prostatic hypertrophy)   . CAD (coronary artery disease)    a. s/p CABG  . CAD, ARTERY BYPASS GRAFT 11/18/2008   Qualifier: Diagnosis of  By: Angelena Form, MD, Harrell Gave    .  Chronic pain    . CVA (cerebral infarction)     Small left occipital  . Dementia with behavioral disturbance 02/15/2015  . Depression    . Diabetic peripheral neuropathy ([place]) 09/28/2011  . DM        . DM (diabetes mellitus), type 2 with peripheral vascular complications ([place]) 6/46/8032   Qualifier: Diagnosis of  By: Burnett Kanaris    . Gout    . History of alcohol abuse    . Hyperlipidemia    . HYPERTENSION        . Hypertensive heart disease with heart failure ([place]) 09/01/2008   Qualifier: Diagnosis of  By: Burnett Kanaris    . Insomnia 04/19/2012  . Ischemic cardiomyopathy    . Narcolepsy    . Obesity   . OBESITY 09/01/2008   Qualifier: Diagnosis of  By: Burnett Kanaris    . OBSTRUCTIVE SLEEP APNEA        . Obstructive sleep apnea 09/16/2008   Qualifier: Diagnosis of  By: [name] MD, [name]   . Pacemaker 12/12/2014  . Parkinsonism ([place]) 09/30/2015  . [facility] spotted fever   . SINUS TACHYCARDIA 09/02/2008   Qualifier: Diagnosis of  By: Caryl Comes, MD, [name]   . TIA (transient ischemic attack)    . TOBACCO ABUSE 07/31/2007   Qualifier: Diagnosis of  By: [name] MD, [name]   . Tremor 08/11/2015  . UNIVERSAL ULCERATIVE COLITIS        . Vitamin D deficiency 01/07/2015  Past Surgical History:  Procedure Laterality Date  . CARDIAC CATHETERIZATION     Triple-vessel coronary artery disease. Low-normal left ventricular systolic function with mild   anteroapical wall motion abnormality.   [name] Kitchen CARDIAC CATHETERIZATION N/A 01/28/2015   Procedure: Temporary Pacemaker;  Surgeon: [name], MD;  Location: [facility];  Service: Cardiovascular;  Laterality: N/A;  . CORONARY ARTERY BYPASS GRAFT     Median sternotomy, extracorporeal circulation,  coronary artery bypass graft surgery x4 using a sequential left internal   mammary artery graft to the mid and distal left anterior descending, a   saphenous vein graft to diagonal branch of the left anterior  descending,   and a saphenous vein graft to the obtuse marginal branch of left   circumflex coronary artery.    . EP IMPLANTABLE DEVICE N/A 12/12/2014   Procedure: Pacemaker Implant;  Surgeon: [name], MD;  Location: [facility];  Service: Cardiovascular;  Laterality: N/A;  . EP IMPLANTABLE DEVICE N/A 01/28/2015   Procedure: PPM Generator Removal;  Surgeon: [name], MD;  Location: [facility];  Service: Cardiovascular;  Laterality: N/A;  . EP IMPLANTABLE DEVICE N/A 03/20/2015   Procedure: Pacemaker Implant;  Surgeon: [name], MD;  Location: [facility];  Service: Cardiovascular;  Laterality: N/A;  . FLEXIBLE SIGMOIDOSCOPY  01/17/2012   Procedure: FLEXIBLE SIGMOIDOSCOPY;  Surgeon: Lafayette Dragon, MD;  Location: [facility];  Service: Endoscopy;  Laterality: N/A;  . MULTIPLE EXTRACTIONS WITH ALVEOLOPLASTY  12/01/2011   Procedure: MULTIPLE EXTRACION WITH ALVEOLOPLASTY;  Surgeon: Lenn Cal, DDS;  Location: [facility];  Service: Oral Surgery;  Laterality: N/A;  Extraction of tooth #'s 3,4,5,6,7,8,9,10,11,12,13,14,15,16,17,20,21,22,23,24,25,26,27,28,29,30,31,32 with alveoloplasty and bilateral mandibular lingual tori  . TONSILECTOMY, ADENOIDECTOMY, BILATERAL MYRINGOTOMY AND TUBES  1948    Allergies as of 08/31/2016      Reactions   Benzedrine [amphetamine] Nausea Only   Dexedrine [dextroamphetamine Sulfate Er] Other (See Comments)   agitation   Oxycodone Nausea And Vomiting   Vancomycin    Unknown - per [facility]      Medication List       Accurate as of 08/31/16 11:58 AM. Always use your most recent med list.          acetaminophen 650 MG suppository Commonly known as:  TYLENOL Place 650 mg rectally every 6 (six) hours as needed for fever. For fever above 100F   aspirin 81 MG chewable tablet Chew 81 mg by mouth at bedtime.   BIOFREEZE 4 % Gel Generic drug:  Menthol (Topical Analgesic) as directed. Apply to right elbow twice daily as needed for  pain.   carbidopa-levodopa 25-100 MG tablet Commonly known as:  SINEMET IR Take 2 tablets by mouth 3 (three) times daily with meals.   carboxymethylcellulose 0.5 % Soln Commonly known as:  REFRESH PLUS Place 1 drop into both eyes daily as needed.   carvedilol 3.125 MG tablet Commonly known as:  COREG Take 3.125 mg by mouth 2 (two) times daily with a meal. Hold for SBP less than 105   clonazePAM 0.5 MG tablet Commonly known as:  KLONOPIN Take 0.5 mg by mouth at bedtime.   docusate sodium 100 MG capsule Commonly known as:  COLACE Take 100 mg by mouth daily. Hold for > 2 loose stools in a day   donepezil 10 MG tablet Commonly known as:  ARICEPT Take 10 mg by mouth at bedtime.   doxazosin 1 MG tablet Commonly known  as:  CARDURA Take 1 mg by mouth at bedtime.   fenofibrate 145 MG tablet Commonly known as:  TRICOR Take 145 mg by mouth daily. With a meal   Fish Oil 1000 MG Caps Take 2 capsules by mouth 2 (two) times daily.   furosemide 20 MG tablet Commonly known as:  LASIX Take 40 mg by mouth daily.   gabapentin 400 MG capsule Commonly known as:  NEURONTIN 400 mg 3 (three) times daily. [place] 100 UNIT/ML KiwkPen Generic drug:  insulin lispro Inject 5 Units into the skin 3 (three) times daily. Hold for CBG  < 200. ( to be given in addition to sliding scale) Check FSBS before meals and at bedtime. SSI 201-25 4 units, 251-300 6 units, 301-350 8 units, 351-400 10 units   ketoconazole 2 % cream Commonly known as:  NIZORAL Apply 1 application topically as directed. Apply to face/neck as needed   lamoTRIgine 25 MG tablet Commonly known as:  LAMICTAL Take 25 mg by mouth daily. give one tab with Lamictal 100 mg to equal 125 mg daily   lamoTRIgine 100 MG tablet Commonly known as:  LAMICTAL Take 100 mg by mouth daily. Give 100 mg tablet along with 25 mg tablet to equal 125 mg total   LEVEMIR FLEXTOUCH 100 UNIT/ML Pen Generic drug:  Insulin Detemir Inject  45 Units into the skin daily.   magnesium oxide 400 MG tablet Commonly known as:  MAG-OX Take 400 mg by mouth daily.   metFORMIN 1000 MG tablet Commonly known as:  GLUCOPHAGE Take 1,000 mg by mouth 2 (two) times daily.   multivitamin tablet Take 1 tablet by mouth daily.   nitroGLYCERIN 0.4 MG SL tablet Commonly known as:  NITROSTAT Place 0.4 mg under the tongue every 5 (five) minutes as needed for chest pain.   OLANZapine 2.5 MG tablet Commonly known as:  ZYPREXA Take 2.5 mg by mouth at bedtime.   OXYGEN Inhale 2 L into the lungs continuous. To keep spo2 above 90%   polyethylene glycol packet Commonly known as:  MIRALAX / GLYCOLAX Take 17 g by mouth daily.   pravastatin 40 MG tablet Commonly known as:  PRAVACHOL Take 40 mg by mouth at bedtime.   ROBAFEN 100 MG/5ML syrup Generic drug:  guaifenesin Take 100 mg by mouth every 8 (eight) hours as needed for cough.   traMADol 50 MG tablet Commonly known as:  ULTRAM Take 50 mg by mouth as directed. Every shift for pain   traZODone 50 MG tablet Commonly known as:  DESYREL Take 50 mg by mouth at bedtime.   Vitamin D (Ergocalciferol) 50000 units Caps capsule Commonly known as:  DRISDOL Take 50,000 Units by mouth every 30 (thirty) days.   Vitamin D3 50000 units Caps Take 1 capsule by mouth daily.   zinc sulfate 220 (50 Zn) MG capsule Take 220 mg by mouth daily. Stop date 07/06/16       No orders of the defined types were placed in this encounter.   Immunization History  Administered Date(s) Administered  . PPD Test 03/20/2013    Social History  Substance Use Topics  . Smoking status: Former Smoker    Packs/day: 2.00    Years: 50.00    Quit date: 08/18/2014  . Smokeless tobacco: Former Systems developer  . Alcohol use No     Comment: quit 2003    Review of Systems  DATA OBTAINED: from patient-Minimal, not reliable; nursing-as per history of present illness GENERAL:  no fevers, fatigue, appetite changes SKIN: No  itching, rash HEENT: No complaint RESPIRATORY: No cough, wheezing, SOB CARDIAC: No chest pain, palpitations, lower extremity edema  GI: No abdominal pain, No N/V/D or constipation, No heartburn or reflux  GU: No dysuria, frequency or urgency, or incontinence  MUSCULOSKELETAL: No unrelieved bone/joint pain NEUROLOGIC: No headache, dizziness  PSYCHIATRIC: No overt anxiety or sadness  Vitals:   08/31/16 1153  BP: 117/76  Pulse: 78  Resp: 20  Temp: 98.5 F (36.9 C)   Body mass index is 32.64 kg/m. Physical Exam  GENERAL APPEARANCE: Alert, Minimally conversant, No acute distress  SKIN: No diaphoresis rash HEENT: Unremarkable RESPIRATORY: Breathing is even, unlabored. Lung sounds are clear   CARDIOVASCULAR: Heart RRR no murmurs, rubs or gallops. No peripheral edema  GASTROINTESTINAL: Abdomen is soft, non-tender, not distended w/ normal bowel sounds.  GENITOURINARY: Bladder non tender, not distended  MUSCULOSKELETAL: No abnormal joints or musculature NEUROLOGIC: Cranial nerves 2-12 grossly intact. Moves all extremities PSYCHIATRIC: Mood and affect with dementia, no behavioral issues  Patient Active Problem List   Diagnosis Date Noted  . Aspiration pneumonia ([place]) 09/30/2015  . Parkinsonism ([place]) 09/30/2015  . Tremor 08/11/2015  . Suicidal ideation 05/10/2015  . Bipolar depression ([place]) 05/10/2015  . Complete heart block ([place]) 03/20/2015  . Bradycardia 03/14/2015  . Dementia with behavioral disturbance 02/15/2015  . Staphylococcus aureus bacteremia with sepsis ([place]) 01/28/2015  . Infection of pacemaker pocket ([place]) 01/28/2015  . Acute respiratory failure ([place]) 01/26/2015  . AKI (acute kidney injury) ([place]) 01/26/2015  . Acute encephalopathy 01/26/2015  . Altered mental status   . Vitamin D deficiency 01/07/2015  . Mobitz type 2 second degree atrioventricular block 12/12/2014  . Pacemaker 12/12/2014  . Symptomatic bradycardia 12/03/2014  . Lower back pain 06/12/2012  .  Right hip pain 06/12/2012  . Intertrigo 05/09/2012  . Insomnia 04/19/2012  . General weakness 03/07/2012  . 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 Diarrhea 01/17/2012  . Pyelonephritis 01/15/2012  . Diabetic peripheral neuropathy ([place]) 09/28/2011  . Urinary incontinence 09/28/2011  . Preventative health care 09/23/2011  . Gout 09/23/2011  . Bipolar affective disorder ([place]) 09/23/2011  . TIA (transient ischemic attack) 09/23/2011  . Chronic pain 09/23/2011  . DNR (do not resuscitate) 09/23/2011  . CVA (cerebral infarction) 09/23/2011  . 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 History of alcohol abuse 09/23/2011  . Benign prostatic hyperplasia   . Left ventricular systolic dysfunction   . Chronic systolic CHF (congestive heart failure) ([place]) 05/05/2011  . Edema 01/03/2011  . CAD (coronary artery disease) 05/18/2010  . Hyperlipidemia 05/18/2010  . Other abnormality of urination(788.69) 11/25/2009  . Other specified forms of chronic ischemic heart disease 05/19/2009  . CAD, ARTERY BYPASS GRAFT 11/18/2008  . AMI, INFERIOR WALL 09/30/2008  . Obstructive sleep apnea 09/16/2008  . SINUS TACHYCARDIA 09/02/2008  . DM (diabetes mellitus), type 2 with peripheral vascular complications ([place]) 93/81/8299  . OBESITY 09/01/2008  . Hypertensive heart disease with heart failure ([place]) 09/01/2008  . TOBACCO ABUSE 07/31/2007  . 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0 3.6  BUN 25* 18 19  CREATININE 0.9 0.8 1.0    Recent Labs  09/25/15 11/12/15 06/08/16  AST 25 30 14   ALT 14 14 16   ALKPHOS 44 29 24*    Recent Labs  09/24/15 0840 11/12/15 06/08/16  WBC 12.5 6.0 6.7  6.7  HGB 14.3 14.3 12.8*  12.8*  HCT 42 44 40*  40*  PLT 200 203 211  211    Recent Labs  11/12/15 06/09/16  CHOL 172 134  LDLCALC  --  42  TRIG 611* 295*   Lab Results  Component Value Date   MICROALBUR >42.6 04/09/2016   Lab Results  Component Value Date   TSH 1.60 06/09/2016   Lab Results  Component Value Date   HGBA1C 8 06/08/2016   Lab Results  Component Value Date   CHOL 134 06/09/2016   HDL 33 (A) 06/09/2016   LDLCALC 42 06/09/2016   LDLDIRECT 85.2 06/13/2012   TRIG 295 (A) 06/09/2016   CHOLHDL 7 06/13/2012    Significant Diagnostic Results in last 30 days:  No results found.  Assessment and Plan  DM2 WITH COMPLICATIONS/HYPERGLYCEMIA-patient currently gets 45 units of Levemir daily 5 units of NovoLog with each meal and a sliding scale with each meal and at bedtime; I reviewed 2 weeks worth of patient's blood sugars. For each  day I added up how much regular insulin patient was using. Based on this will change patient's Levemir to 60 units daily, continue his 5 units with each meal and continue his current sliding scale insulin with meals and at bedtime; we'll monitor for 2 weeks then review again.    [name], MD

## 2016-09-01 ENCOUNTER — Encounter: Payer: Self-pay | Admitting: Internal Medicine

## 2016-09-01 NOTE — Assessment & Plan Note (Signed)
A1c ordered in May was 8 which is a miracle, which is improved from prior which was 9; maybe patient has been more compliant; plan to continue Glucophage 1000 mg by mouth twice a day, Levemir 40 units daily and Humalog 5 units with each meal; we will repeat A1c in 3 months

## 2016-09-01 NOTE — Assessment & Plan Note (Signed)
Most recent LDL is 42; HDL 33; and glycerides 295 which is an improvement over the last lab where they were 622; plan to continue current medications of Pravachol 40 mg by mouth daily, fenofibrate 145 mg by mouth daily and omega-3 fatty acids 1 g by mouth daily

## 2016-09-01 NOTE — Assessment & Plan Note (Signed)
Stable on Sinemet 25-100 by mouth 3 times a day; plan to continue current regimen; tremors continue to be improved on Sinemet

## 2016-09-03 ENCOUNTER — Encounter: Payer: Self-pay | Admitting: Internal Medicine

## 2016-09-14 IMAGING — US IR FLUORO GUIDE CV LINE*L*
1 series · 2 of 2 positions shown · non-contrast
Comparison: none

CLINICAL DATA: Bacteremia, MRSA

[Series 1: ir fluoro/shunt/fist · 2 of 2 slices shown]
[im 1/2]
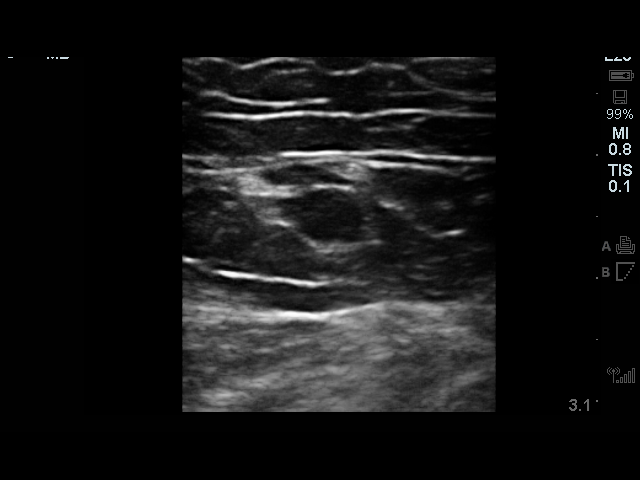
[im 2/2]
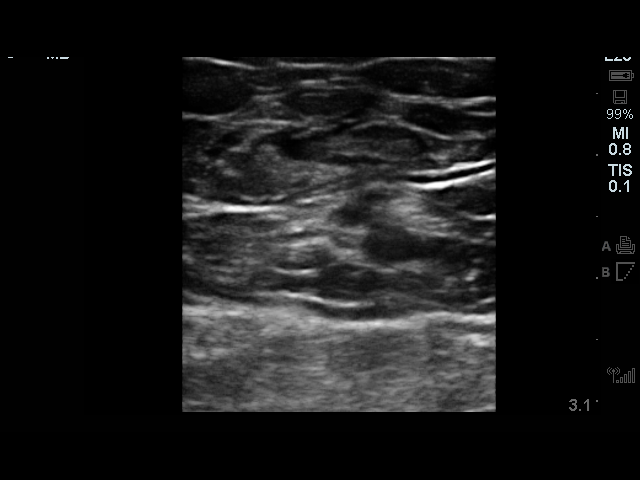

[2 of 2 positions shown; findings below may reference images not displayed]

EXAM:
LEFT UPPER EXTREMITY DOUBLE-LUMEN POWER PICC LINE PLACEMENT WITH
ULTRASOUND AND FLUOROSCOPIC GUIDANCE

FLUOROSCOPY TIME:  1 minutes 12 seconds, 15 mGy

PROCEDURE:
The patient was advised of the possible risks andcomplications and
agreed to undergo the procedure. The patient was then brought to the
angiographic suite for the procedure.

The left arm was prepped with chlorhexidine, drapedin the usual
sterile fashion using maximum barrier technique (cap and mask,
sterile gown, sterile gloves, large sterile sheet, hand hygiene and
cutaneous antisepsis) and infiltrated locally with 1% Lidocaine.

Ultrasound demonstrated patency of the left basilic vein, and this
was documented with an image. Under real-time ultrasound guidance,
this vein was accessed with a 21 gauge micropuncture needle and
image documentation was performed. A [DATE] wire was introduced in to
the vein. Over this, a 5 French double lumen power PICC was advanced
to the lower SVC/right atrial junction. Fluoroscopy during the
procedure and fluoro spot radiograph confirms appropriate catheter
position. The catheter was flushed and covered with asterile
dressing.

Catheter length: 50

Complications: None immediate
IMPRESSION: Successful left arm power PICC line placement with ultrasound and
fluoroscopic guidance. The catheter is ready for use.

## 2016-09-30 ENCOUNTER — Non-Acute Institutional Stay (SKILLED_NURSING_FACILITY): Payer: Medicare Other | Admitting: Internal Medicine

## 2016-09-30 ENCOUNTER — Encounter: Payer: Self-pay | Admitting: Internal Medicine

## 2016-09-30 DIAGNOSIS — F319 Bipolar disorder, unspecified: Secondary | ICD-10-CM

## 2016-09-30 DIAGNOSIS — F3175 Bipolar disorder, in partial remission, most recent episode depressed: Secondary | ICD-10-CM

## 2016-09-30 DIAGNOSIS — N400 Enlarged prostate without lower urinary tract symptoms: Secondary | ICD-10-CM

## 2016-09-30 DIAGNOSIS — F3130 Bipolar disorder, current episode depressed, mild or moderate severity, unspecified: Secondary | ICD-10-CM

## 2016-09-30 NOTE — Progress Notes (Signed)
Location:  [facility] Room Number: [place] of Service:  SNF ([phone])  [name], MD  Patient Care Team: [name], MD as PCP - General (Internal Medicine) [name], MD as Consulting Physician (Cardiology) [name], MD as Consulting Physician (Neurology)  Extended Emergency Contact Information Primary Emergency Contact: [name],[name] Address: [address]          [place], [place] [address] [name] of [place] Phone: [phone] Mobile Phone: [phone] Relation: Spouse Secondary Emergency Contact: [name], [name] of [place] Phone: [phone] Mobile Phone: [phone] Relation: Daughter    Allergies: Benzedrine [amphetamine]; Dexedrine [dextroamphetamine sulfate er]; Oxycodone; and Vancomycin  Chief Complaint  Patient presents with  . Medical Management of Chronic Issues    routine visit    HPI: Patient is 76 y.o. male who Is being seen for routine issues of bipolar depression, bipolar disorder, and BPH.  Past Medical History:  Diagnosis Date  . 2nd degree AV block    a. s/p STJ dual chamber pacemaker 11/2014  . Acute respiratory failure ([place]) 01/26/2015  . AKI (acute kidney injury) ([place]) 01/26/2015  . AMI, INFERIOR WALL 09/30/2008   Qualifier: Diagnosis of  By: Unk Lightning, RN, BSN, Melanie    . Benign neoplasm of colon     Adenomatous polyps  . Benign prostatic hyperplasia   . Bipolar affective disorder ([place])    . Bipolar depression ([place]) 05/10/2015  . BPH (benign prostatic hypertrophy)   . CAD (coronary artery disease)    a. s/p CABG  . CAD, ARTERY BYPASS GRAFT 11/18/2008   Qualifier: Diagnosis of  By: Angelena Form, MD, Harrell Gave    . Chronic pain    . CVA (cerebral infarction)     Small left occipital  . Dementia with behavioral disturbance 02/15/2015  . Depression    . Diabetic peripheral neuropathy ([place]) 09/28/2011  . DM        . DM (diabetes  mellitus), type 2 with peripheral vascular complications ([place]) 0/81/4481   Qualifier: Diagnosis of  By: Burnett Kanaris    . Gout    . History of alcohol abuse    . Hyperlipidemia    . HYPERTENSION        . Hypertensive heart disease with heart failure ([place]) 09/01/2008   Qualifier: Diagnosis of  By: Burnett Kanaris    . Insomnia 04/19/2012  . Ischemic cardiomyopathy    . Narcolepsy    . Obesity   . OBESITY 09/01/2008   Qualifier: Diagnosis of  By: Burnett Kanaris    . OBSTRUCTIVE SLEEP APNEA        . Obstructive sleep apnea 09/16/2008   Qualifier: Diagnosis of  By: [name] MD, [name]   . Pacemaker 12/12/2014  . Parkinsonism ([place]) 09/30/2015  . [facility] spotted fever   . SINUS TACHYCARDIA 09/02/2008   Qualifier: Diagnosis of  By: Caryl Comes, MD, [name]   . TIA (transient ischemic attack)    . TOBACCO ABUSE 07/31/2007   Qualifier: Diagnosis of  By: [name] MD, [name]   . Tremor 08/11/2015  . UNIVERSAL ULCERATIVE COLITIS        . Vitamin D deficiency 01/07/2015    Past Surgical History:  Procedure Laterality Date  . CARDIAC CATHETERIZATION     Triple-vessel coronary artery disease. Low-normal left ventricular systolic function with mild   anteroapical wall motion abnormality.   [name] Kitchen CARDIAC  CATHETERIZATION N/A 01/28/2015   Procedure: Temporary Pacemaker;  Surgeon: [name], MD;  Location: [facility];  Service: Cardiovascular;  Laterality: N/A;  . CORONARY ARTERY BYPASS GRAFT     Median sternotomy, extracorporeal circulation,  coronary artery bypass graft surgery x4 using a sequential left internal   mammary artery graft to the mid and distal left anterior descending, a   saphenous vein graft to diagonal branch of the left anterior descending,   and a saphenous vein graft to the obtuse marginal branch of left   circumflex coronary artery.    . EP IMPLANTABLE DEVICE N/A 12/12/2014   Procedure: Pacemaker Implant;  Surgeon: [name], MD;  Location: [facility];  Service: Cardiovascular;  Laterality: N/A;  . EP IMPLANTABLE DEVICE N/A 01/28/2015   Procedure: PPM Generator Removal;  Surgeon: [name], MD;  Location: [facility];  Service: Cardiovascular;  Laterality: N/A;  . EP IMPLANTABLE DEVICE N/A 03/20/2015   Procedure: Pacemaker Implant;  Surgeon: [name], MD;  Location: [facility];  Service: Cardiovascular;  Laterality: N/A;  . FLEXIBLE SIGMOIDOSCOPY  01/17/2012   Procedure: FLEXIBLE SIGMOIDOSCOPY;  Surgeon: Lafayette Dragon, MD;  Location: [facility];  Service: Endoscopy;  Laterality: N/A;  . MULTIPLE EXTRACTIONS WITH ALVEOLOPLASTY  12/01/2011   Procedure: MULTIPLE EXTRACION WITH ALVEOLOPLASTY;  Surgeon: Lenn Cal, DDS;  Location: [facility];  Service: Oral Surgery;  Laterality: N/A;  Extraction of tooth #'s 3,4,5,6,7,8,9,10,11,12,13,14,15,16,17,20,21,22,23,24,25,26,27,28,29,30,31,32 with alveoloplasty and bilateral mandibular lingual tori  . TONSILECTOMY, ADENOIDECTOMY, BILATERAL MYRINGOTOMY AND TUBES  1948    Allergies as of 09/30/2016      Reactions   Benzedrine [amphetamine] Nausea Only   Dexedrine [dextroamphetamine Sulfate Er] Other (See Comments)   agitation   Oxycodone Nausea And Vomiting   Vancomycin    Unknown - per [facility]      Medication List       Accurate as of 09/30/16 11:59 PM. Always use your most recent med list.          acetaminophen 650 MG suppository Commonly known as:  TYLENOL Place 650 mg rectally every 6 (six) hours as needed for fever. For fever above 100F   aspirin 81 MG chewable tablet Chew 81 mg by mouth at bedtime.   BIOFREEZE 4 % Gel Generic drug:  Menthol (Topical Analgesic) as directed. Apply to right elbow twice daily as needed for pain.   carbidopa-levodopa 25-100 MG tablet Commonly known as:  SINEMET IR Take 2 tablets by mouth 3 (three) times daily with meals.   carboxymethylcellulose 0.5 % Soln Commonly known as:  REFRESH PLUS Place 1 drop into both  eyes daily as needed.   carvedilol 3.125 MG tablet Commonly known as:  COREG Take 3.125 mg by mouth 2 (two) times daily with a meal. Hold for SBP less than 105   clonazePAM 0.5 MG tablet Commonly known as:  KLONOPIN Take 0.5 mg by mouth at bedtime.   docusate sodium 100 MG capsule Commonly known as:  COLACE Take 100 mg by mouth daily. Hold for > 2 loose stools in a day   donepezil 10 MG tablet Commonly known as:  ARICEPT Take 10 mg by mouth at bedtime.   doxazosin 1 MG tablet Commonly known as:  CARDURA Take 1 mg by mouth at bedtime.   fenofibrate 145 MG tablet Commonly known as:  TRICOR Take 145 mg by mouth daily. With a meal   Fish Oil 1000 MG  Caps Take 2 capsules by mouth 2 (two) times daily.   furosemide 20 MG tablet Commonly known as:  LASIX Take 40 mg by mouth daily.   gabapentin 400 MG capsule Commonly known as:  NEURONTIN 400 mg 3 (three) times daily. [place] 100 UNIT/ML KiwkPen Generic drug:  insulin lispro Inject 5 Units into the skin 3 (three) times daily. Hold for CBG  < 200. ( to be given in addition to sliding scale) Check FSBS before meals and at bedtime. SSI 201-25 4 units, 251-300 6 units, 301-350 8 units, 351-400 10 units   ketoconazole 2 % cream Commonly known as:  NIZORAL Apply 1 application topically as directed. Apply to face/neck as needed   lamoTRIgine 25 MG tablet Commonly known as:  LAMICTAL Take 25 mg by mouth daily. give one tab with Lamictal 100 mg to equal 125 mg daily   lamoTRIgine 100 MG tablet Commonly known as:  LAMICTAL Take 100 mg by mouth daily. Give 100 mg tablet along with 25 mg tablet to equal 125 mg total   LEVEMIR FLEXTOUCH 100 UNIT/ML Pen Generic drug:  Insulin Detemir Inject 60 Units into the skin daily.   magnesium oxide 400 MG tablet Commonly known as:  MAG-OX Take 400 mg by mouth daily.   metFORMIN 1000 MG tablet Commonly known as:  GLUCOPHAGE Take 1,000 mg by mouth 2 (two) times daily.     multivitamin tablet Take 1 tablet by mouth daily.   nitroGLYCERIN 0.4 MG SL tablet Commonly known as:  NITROSTAT Place 0.4 mg under the tongue every 5 (five) minutes as needed for chest pain.   OLANZapine 2.5 MG tablet Commonly known as:  ZYPREXA Take 2.5 mg by mouth at bedtime.   OXYGEN Inhale 2 L into the lungs continuous. To keep spo2 above 90%   polyethylene glycol packet Commonly known as:  MIRALAX / GLYCOLAX Take 17 g by mouth daily.   pravastatin 40 MG tablet Commonly known as:  PRAVACHOL Take 40 mg by mouth at bedtime.   ROBAFEN 100 MG/5ML syrup Generic drug:  guaifenesin Take 100 mg by mouth every 8 (eight) hours as needed for cough.   traMADol 50 MG tablet Commonly known as:  ULTRAM Take 50 mg by mouth as directed. Every shift for pain   traZODone 50 MG tablet Commonly known as:  DESYREL Take 50 mg by mouth at bedtime.       No orders of the defined types were placed in this encounter.   Immunization History  Administered Date(s) Administered  . PPD Test 03/20/2013    Social History  Substance Use Topics  . Smoking status: Former Smoker    Packs/day: 2.00    Years: 50.00    Quit date: 08/18/2014  . Smokeless tobacco: Former Systems developer  . Alcohol use No     Comment: quit 2003    Review of Systems  DATA OBTAINED: from patient-Limited; nurse-no concerns GENERAL:  no fevers, fatigue, appetite changes SKIN: No itching, rash HEENT: No complaint RESPIRATORY: No cough, wheezing, SOB CARDIAC: No chest pain, palpitations, lower extremity edema  GI: No abdominal pain, No N/V/D or constipation, No heartburn or reflux  GU: No dysuria, frequency or urgency, or incontinence  MUSCULOSKELETAL: No unrelieved bone/joint pain NEUROLOGIC: No headache, dizziness  PSYCHIATRIC: No overt anxiety or sadness  Vitals:   09/30/16 1322  BP: 117/76  Pulse: 78  Resp: 18  Temp: 98.7 F (37.1 C)   Body mass index is 33.23 kg/m. Physical  Exam  GENERAL APPEARANCE:  Alert,  No acute distress  SKIN: No diaphoresis rash HEENT: Unremarkable RESPIRATORY: Breathing is even, unlabored. Lung sounds are clear   CARDIOVASCULAR: Heart RRR no murmurs, rubs or gallops. No peripheral edema  GASTROINTESTINAL: Abdomen is soft, non-tender, not distended w/ normal bowel sounds.  GENITOURINARY: Bladder non tender, not distended  MUSCULOSKELETAL: No abnormal joints or musculature NEUROLOGIC: Cranial nerves 2-12 grossly intact. Moves all extremities; Mild tremor PSYCHIATRIC: Mood and affect flat with dementia, no behavioral issues  Patient Active Problem List   Diagnosis Date Noted  . Aspiration pneumonia ([place]) 09/30/2015  . Parkinsonism ([place]) 09/30/2015  . Tremor 08/11/2015  . Suicidal ideation 05/10/2015  . Bipolar depression ([place]) 05/10/2015  . Complete heart block ([place]) 03/20/2015  . Bradycardia 03/14/2015  . Dementia with behavioral disturbance 02/15/2015  . Staphylococcus aureus bacteremia with sepsis ([place]) 01/28/2015  . Infection of pacemaker pocket ([place]) 01/28/2015  . Acute respiratory failure ([place]) 01/26/2015  . AKI (acute kidney injury) ([place]) 01/26/2015  . Acute encephalopathy 01/26/2015  . Altered mental status   . Vitamin D deficiency 01/07/2015  . [place] type 2 second degree atrioventricular block 12/12/2014  . Pacemaker 12/12/2014  . Symptomatic bradycardia 12/03/2014  . Lower back pain 06/12/2012  . Right hip pain 06/12/2012  . Intertrigo 05/09/2012  . Insomnia 04/19/2012  . General weakness 03/07/2012  . 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 Diarrhea 01/17/2012  . Pyelonephritis 01/15/2012  . Diabetic peripheral neuropathy ([place]) 09/28/2011  . Urinary incontinence 09/28/2011  . Preventative health care 09/23/2011  . Gout 09/23/2011  . Bipolar affective disorder ([place]) 09/23/2011  . TIA (transient ischemic attack) 09/23/2011  . Chronic pain 09/23/2011  . DNR (do not resuscitate) 09/23/2011  . CVA (cerebral infarction)  09/23/2011  . 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 History of alcohol abuse 09/23/2011  . Benign prostatic hyperplasia   . Left ventricular systolic dysfunction   . Chronic systolic CHF (congestive heart failure) ([employer]) 05/05/2011  . Edema 01/03/2011  . CAD (coronary artery disease) 05/18/2010  . Hyperlipidemia 05/18/2010  . Other abnormality of urination(788.69) 11/25/2009  . Other specified forms of chronic ischemic heart disease 05/19/2009  . CAD, ARTERY BYPASS GRAFT 11/18/2008  . AMI, INFERIOR WALL 09/30/2008  . Obstructive sleep apnea 09/16/2008  . SINUS TACHYCARDIA 09/02/2008  . DM (diabetes mellitus), type 2 with peripheral vascular complications ([place]) 36/14/4315  . OBESITY 09/01/2008  . Hypertensive heart disease with heart failure ([place]) 09/01/2008  . TOBACCO ABUSE 07/31/2007  . 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0 3.6  BUN 25* 18 19  CREATININE 0.9 0.8 1.0    Recent Labs  11/12/15 06/08/16  AST 30 14  ALT 14 16  ALKPHOS 29 24*  Recent Labs  11/12/15 06/08/16  WBC 6.0 6.7  6.7  HGB 14.3 12.8*  12.8*  HCT 44 40*  40*  PLT 203 211  211    Recent Labs  11/12/15 06/09/16  CHOL 172 134  LDLCALC  --  42  TRIG 611* 295*   Lab Results  Component Value Date   MICROALBUR >42.6 04/09/2016   Lab Results  Component Value Date   TSH 1.60 06/09/2016    Lab Results  Component Value Date   HGBA1C 8 06/08/2016   Lab Results  Component Value Date   CHOL 134 06/09/2016   HDL 33 (A) 06/09/2016   LDLCALC 42 06/09/2016   LDLDIRECT 85.2 06/13/2012   TRIG 295 (A) 06/09/2016   CHOLHDL 7 06/13/2012    Significant Diagnostic Results in last 30 days:  No results found.  Assessment and Plan  Bipolar depression ([place]) At baseline; plan to continue trazodone 50 mg by mouth daily at bedtime  Bipolar affective disorder ([place]) No reports of mood instability; plan to continue Zyprexa 2.5 mg by mouth daily at bedtime and Lamictal 125 mg by mouth daily  Benign prostatic hyperplasia Reports of infections or problems; plan to continue Cardura 1 mg by mouth daily    Olman Yono D. [name], MD

## 2016-10-18 ENCOUNTER — Ambulatory Visit (INDEPENDENT_AMBULATORY_CARE_PROVIDER_SITE_OTHER): Payer: Medicare Other | Admitting: *Deleted

## 2016-10-18 DIAGNOSIS — I442 Atrioventricular block, complete: Secondary | ICD-10-CM | POA: Diagnosis not present

## 2016-10-18 NOTE — Progress Notes (Signed)
Remote pacemaker transmission.   

## 2016-10-19 ENCOUNTER — Encounter: Payer: Self-pay | Admitting: Cardiology

## 2016-10-24 LAB — CUP PACEART REMOTE DEVICE CHECK
Battery Voltage: 3.01 V
Brady Statistic AP VP Percent: 27 %
Brady Statistic AS VS Percent: 1 %
Implantable Lead Implant Date: 20170303
Implantable Lead Location: 753860
Lead Channel Impedance Value: 460 Ohm
Lead Channel Pacing Threshold Amplitude: 1.125 V
Lead Channel Pacing Threshold Pulse Width: 0.5 ms
Lead Channel Setting Pacing Amplitude: 0.875
Lead Channel Setting Sensing Sensitivity: 4 mV
MDC IDC LEAD IMPLANT DT: 20170303
MDC IDC LEAD LOCATION: 753859
MDC IDC MSMT BATTERY REMAINING LONGEVITY: 118 mo
MDC IDC MSMT BATTERY REMAINING PERCENTAGE: 95.5 %
MDC IDC MSMT LEADCHNL RA IMPEDANCE VALUE: 410 Ohm
MDC IDC MSMT LEADCHNL RA SENSING INTR AMPL: 5 mV
MDC IDC MSMT LEADCHNL RV PACING THRESHOLD AMPLITUDE: 0.625 V
MDC IDC MSMT LEADCHNL RV PACING THRESHOLD PULSEWIDTH: 0.5 ms
MDC IDC PG IMPLANT DT: 20170303
MDC IDC SESS DTM: 20181002103622
MDC IDC SET LEADCHNL RA PACING AMPLITUDE: 2.5 V
MDC IDC SET LEADCHNL RV PACING PULSEWIDTH: 0.5 ms
MDC IDC STAT BRADY AP VS PERCENT: 1 %
MDC IDC STAT BRADY AS VP PERCENT: 73 %
MDC IDC STAT BRADY RA PERCENT PACED: 25 %
MDC IDC STAT BRADY RV PERCENT PACED: 99 %
Pulse Gen Serial Number: 7888922

## 2016-10-28 ENCOUNTER — Encounter: Payer: Self-pay | Admitting: Internal Medicine

## 2016-10-28 ENCOUNTER — Non-Acute Institutional Stay (SKILLED_NURSING_FACILITY): Payer: Medicare Other | Admitting: Internal Medicine

## 2016-10-28 DIAGNOSIS — F02818 Dementia in other diseases classified elsewhere, unspecified severity, with other behavioral disturbance: Secondary | ICD-10-CM

## 2016-10-28 DIAGNOSIS — Z794 Long term (current) use of insulin: Secondary | ICD-10-CM | POA: Diagnosis not present

## 2016-10-28 DIAGNOSIS — G300 Alzheimer's disease with early onset: Secondary | ICD-10-CM | POA: Diagnosis not present

## 2016-10-28 DIAGNOSIS — E1142 Type 2 diabetes mellitus with diabetic polyneuropathy: Secondary | ICD-10-CM | POA: Diagnosis not present

## 2016-10-28 DIAGNOSIS — F0281 Dementia in other diseases classified elsewhere with behavioral disturbance: Secondary | ICD-10-CM | POA: Diagnosis not present

## 2016-10-28 DIAGNOSIS — E118 Type 2 diabetes mellitus with unspecified complications: Secondary | ICD-10-CM | POA: Diagnosis not present

## 2016-10-28 NOTE — Progress Notes (Signed)
Location:  [facility] Room Number: [phone] Place of Service:  SNF (31)  [name], MD  Patient Care Team: [name], MD as PCP - General (Internal Medicine) [name], MD as Consulting Physician (Cardiology) [name], MD as Consulting Physician (Neurology)  Extended Emergency Contact Information Primary Emergency Contact: [name],[name] Address: [address]          [place], [place] [address] [name] of [place] Phone: [phone] Mobile Phone: [phone] Relation: Spouse Secondary Emergency Contact: [name], [name] of [place] Phone: [phone] Mobile Phone: [phone] Relation: Daughter    Allergies: Benzedrine [amphetamine]; Dexedrine [dextroamphetamine sulfate er]; Oxycodone; and Vancomycin  Chief Complaint  Patient presents with  . Medical Management of Chronic Issues    routine visit    HPI: Patient is 76 y.o. male who Is being seen for routine issues of peripheral neuropathy, diabetes mellitus type 2, and dementia.  Past Medical History:  Diagnosis Date  . 2nd degree AV block    a. s/p STJ dual chamber pacemaker 11/2014  . Acute respiratory failure ([place]) 01/26/2015  . AKI (acute kidney injury) ([place]) 01/26/2015  . AMI, INFERIOR WALL 09/30/2008   Qualifier: Diagnosis of  By: Unk Lightning, RN, BSN, Melanie    . Benign neoplasm of colon     Adenomatous polyps  . Benign prostatic hyperplasia   . Bipolar affective disorder ([place])    . Bipolar depression ([place]) 05/10/2015  . BPH (benign prostatic hypertrophy)   . CAD (coronary artery disease)    a. s/p CABG  . CAD, ARTERY BYPASS GRAFT 11/18/2008   Qualifier: Diagnosis of  By: Angelena Form, MD, Harrell Gave    . Chronic pain    . CVA (cerebral infarction)     Small left occipital  . Dementia with behavioral disturbance 02/15/2015  . Depression    . Diabetic peripheral neuropathy ([place]) 09/28/2011  . DM        .  DM (diabetes mellitus), type 2 with peripheral vascular complications ([place]) 07/25/6281   Qualifier: Diagnosis of  By: Burnett Kanaris    . Gout    . History of alcohol abuse    . Hyperlipidemia    . HYPERTENSION        . Hypertensive heart disease with heart failure ([place]) 09/01/2008   Qualifier: Diagnosis of  By: Burnett Kanaris    . Insomnia 04/19/2012  . Ischemic cardiomyopathy    . Narcolepsy    . Obesity   . OBESITY 09/01/2008   Qualifier: Diagnosis of  By: Burnett Kanaris    . OBSTRUCTIVE SLEEP APNEA        . Obstructive sleep apnea 09/16/2008   Qualifier: Diagnosis of  By: [name] MD, [name]   . Pacemaker 12/12/2014  . Parkinsonism ([place]) 09/30/2015  . [facility] spotted fever   . SINUS TACHYCARDIA 09/02/2008   Qualifier: Diagnosis of  By: Caryl Comes, MD, [name]   . TIA (transient ischemic attack)    . TOBACCO ABUSE 07/31/2007   Qualifier: Diagnosis of  By: [name] MD, [name]   . Tremor 08/11/2015  . UNIVERSAL ULCERATIVE COLITIS        . Vitamin D deficiency 01/07/2015    Past Surgical History:  Procedure Laterality Date  . CARDIAC CATHETERIZATION     Triple-vessel coronary artery disease. Low-normal left ventricular systolic function with mild   anteroapical wall motion abnormality.   [name] Kitchen  CARDIAC CATHETERIZATION N/A 01/28/2015   Procedure: Temporary Pacemaker;  Surgeon: [name], MD;  Location: [facility] LAB;  Service: Cardiovascular;  Laterality: N/A;  . CORONARY ARTERY BYPASS GRAFT     Median sternotomy, extracorporeal circulation,  coronary artery bypass graft surgery x4 using a sequential left internal   mammary artery graft to the mid and distal left anterior descending, a   saphenous vein graft to diagonal branch of the left anterior descending,   and a saphenous vein graft to the obtuse marginal branch of left   circumflex coronary artery.    . EP IMPLANTABLE DEVICE N/A 12/12/2014   Procedure: Pacemaker Implant;  Surgeon: [name], MD;   Location: [facility];  Service: Cardiovascular;  Laterality: N/A;  . EP IMPLANTABLE DEVICE N/A 01/28/2015   Procedure: PPM Generator Removal;  Surgeon: [name], MD;  Location: [facility];  Service: Cardiovascular;  Laterality: N/A;  . EP IMPLANTABLE DEVICE N/A 03/20/2015   Procedure: Pacemaker Implant;  Surgeon: [name], MD;  Location: [facility];  Service: Cardiovascular;  Laterality: N/A;  . FLEXIBLE SIGMOIDOSCOPY  01/17/2012   Procedure: FLEXIBLE SIGMOIDOSCOPY;  Surgeon: Lafayette Dragon, MD;  Location: [facility];  Service: Endoscopy;  Laterality: N/A;  . MULTIPLE EXTRACTIONS WITH ALVEOLOPLASTY  12/01/2011   Procedure: MULTIPLE EXTRACION WITH ALVEOLOPLASTY;  Surgeon: Lenn Cal, DDS;  Location: [facility];  Service: Oral Surgery;  Laterality: N/A;  Extraction of tooth #'s 3,4,5,6,7,8,9,10,11,12,13,14,15,16,17,20,21,22,23,24,25,26,27,28,29,30,31,32 with alveoloplasty and bilateral mandibular lingual tori  . TONSILECTOMY, ADENOIDECTOMY, BILATERAL MYRINGOTOMY AND TUBES  1946    Allergies as of 10/28/2016      Reactions   Benzedrine [amphetamine] Nausea Only   Dexedrine [dextroamphetamine Sulfate Er] Other (See Comments)   agitation   Oxycodone Nausea And Vomiting   Vancomycin    Unknown - per [facility]      Medication List       Accurate as of 10/28/16 11:59 PM. Always use your most recent med list.          acetaminophen 650 MG suppository Commonly known as:  TYLENOL Place 650 mg rectally every 6 (six) hours as needed for fever. For fever above 100F   aspirin 81 MG chewable tablet Chew 81 mg by mouth at bedtime.   BIOFREEZE 4 % Gel Generic drug:  Menthol (Topical Analgesic) as directed. Apply to right elbow twice daily as needed for pain.   carbidopa-levodopa 25-100 MG tablet Commonly known as:  SINEMET IR Take 2 tablets by mouth 3 (three) times daily with meals.   carboxymethylcellulose 0.5 % Soln Commonly known as:  REFRESH PLUS Place 1  drop into both eyes daily as needed.   carvedilol 3.125 MG tablet Commonly known as:  COREG Take 3.125 mg by mouth 2 (two) times daily with a meal. Hold for SBP less than 105   clonazePAM 0.5 MG tablet Commonly known as:  KLONOPIN Take 0.5 mg by mouth at bedtime.   docusate sodium 100 MG capsule Commonly known as:  COLACE Take 100 mg by mouth daily. Hold for > 2 loose stools in a day   donepezil 10 MG tablet Commonly known as:  ARICEPT Take 10 mg by mouth at bedtime.   doxazosin 1 MG tablet Commonly known as:  CARDURA Take 1 mg by mouth at bedtime.   fenofibrate 145 MG tablet Commonly known as:  TRICOR Take 145 mg by mouth daily. With a meal   Fish Oil 1000  MG Caps Take 2 capsules by mouth 2 (two) times daily.   furosemide 20 MG tablet Commonly known as:  LASIX Take 40 mg by mouth daily.   gabapentin 400 MG capsule Commonly known as:  NEURONTIN 400 mg 3 (three) times daily. [place] 100 UNIT/ML KiwkPen Generic drug:  insulin lispro Inject 5 Units into the skin 3 (three) times daily. Hold for CBG  < 200. ( to be given in addition to sliding scale) Check FSBS before meals and at bedtime. SSI 201-25 4 units, 251-300 6 units, 301-350 8 units, 351-400 10 units   ketoconazole 2 % cream Commonly known as:  NIZORAL Apply 1 application topically as directed. Apply to face/neck as needed   lamoTRIgine 25 MG tablet Commonly known as:  LAMICTAL Take 25 mg by mouth daily. give one tab with Lamictal 100 mg to equal 125 mg daily   lamoTRIgine 100 MG tablet Commonly known as:  LAMICTAL Take 100 mg by mouth daily. Give 100 mg tablet along with 25 mg tablet to equal 125 mg total   LEVEMIR FLEXTOUCH 100 UNIT/ML Pen Generic drug:  Insulin Detemir Inject 60 Units into the skin daily.   magnesium oxide 400 MG tablet Commonly known as:  MAG-OX Take 400 mg by mouth daily.   metFORMIN 1000 MG tablet Commonly known as:  GLUCOPHAGE Take 1,000 mg by mouth 2 (two) times  daily.   multivitamin tablet Take 1 tablet by mouth daily.   nitroGLYCERIN 0.4 MG SL tablet Commonly known as:  NITROSTAT Place 0.4 mg under the tongue every 5 (five) minutes as needed for chest pain.   OLANZapine 2.5 MG tablet Commonly known as:  ZYPREXA Take 2.5 mg by mouth at bedtime.   OXYGEN Inhale 2 L into the lungs continuous. To keep spo2 above 90%   polyethylene glycol packet Commonly known as:  MIRALAX / GLYCOLAX Take 17 g by mouth daily.   pravastatin 40 MG tablet Commonly known as:  PRAVACHOL Take 40 mg by mouth at bedtime.   ROBAFEN 100 MG/5ML syrup Generic drug:  guaifenesin Take 100 mg by mouth every 8 (eight) hours as needed for cough.   traMADol 50 MG tablet Commonly known as:  ULTRAM Take 50 mg by mouth as directed. Every shift for pain   traZODone 50 MG tablet Commonly known as:  DESYREL Take 50 mg by mouth at bedtime.       No orders of the defined types were placed in this encounter.   Immunization History  Administered Date(s) Administered  . PPD Test 03/20/2013    Social History  Substance Use Topics  . Smoking status: Former Smoker    Packs/day: 2.00    Years: 50.00    Quit date: 08/18/2014  . Smokeless tobacco: Former Systems developer  . Alcohol use No     Comment: quit 2003    Review of Systems  DATA OBTAINED: from patient-Very limited; nursing-no concerns GENERAL:  no fevers, fatigue, appetite changes SKIN: No itching, rash HEENT: No complaint RESPIRATORY: No cough, wheezing, SOB CARDIAC: No chest pain, palpitations, lower extremity edema  GI: No abdominal pain, No N/V/D or constipation, No heartburn or reflux  GU: No dysuria, frequency or urgency, or incontinence  MUSCULOSKELETAL: No unrelieved bone/joint pain NEUROLOGIC: No headache, dizziness  PSYCHIATRIC: No overt anxiety or sadness  Vitals:   10/28/16 1230  BP: (!) 148/88  Pulse: 82  Resp: 18  Temp: 99 F (37.2 C)  SpO2: 94%   Body mass  index is 38.42 kg/m. Physical  Exam  GENERAL APPEARANCE: Alert, Minimally conversant, No acute distress  SKIN: No diaphoresis rash HEENT: Unremarkable RESPIRATORY: Breathing is even, unlabored. Lung sounds are clear   CARDIOVASCULAR: Heart RRR no murmurs, rubs or gallops. No peripheral edema  GASTROINTESTINAL: Abdomen is soft, non-tender, not distended w/ normal bowel sounds.  GENITOURINARY: Bladder non tender, not distended  MUSCULOSKELETAL: No abnormal joints or musculature NEUROLOGIC: Cranial nerves 2-12 grossly intact. Moves all extremities, patient is mostly bedbound; tremor bilaterally PSYCHIATRIC: Mood and affect flat with dementia, no behavioral issues  Patient Active Problem List   Diagnosis Date Noted  . Controlled diabetes mellitus type 2 with complications ([place]) 40/10/2723  . Aspiration pneumonia ([place]) 09/30/2015  . Parkinsonism ([place]) 09/30/2015  . Tremor 08/11/2015  . Suicidal ideation 05/10/2015  . Bipolar depression ([place]) 05/10/2015  . Complete heart block ([place]) 03/20/2015  . Bradycardia 03/14/2015  . Dementia with behavioral disturbance 02/15/2015  . Staphylococcus aureus bacteremia with sepsis ([employer]) 01/28/2015  . Infection of pacemaker pocket ([place]) 01/28/2015  . Acute respiratory failure ([place]) 01/26/2015  . AKI (acute kidney injury) ([place]) 01/26/2015  . Acute encephalopathy 01/26/2015  . Altered mental status   . Vitamin D deficiency 01/07/2015  . Mobitz type 2 second degree atrioventricular block 12/12/2014  . Pacemaker 12/12/2014  . Symptomatic bradycardia 12/03/2014  . Lower back pain 06/12/2012  . Right hip pain 06/12/2012  . Intertrigo 05/09/2012  . Insomnia 04/19/2012  . General weakness 03/07/2012  . 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 Diarrhea 01/17/2012  . Pyelonephritis 01/15/2012  . Diabetic peripheral neuropathy ([place]) 09/28/2011  . Urinary incontinence 09/28/2011  . Preventative health care 09/23/2011  . Gout 09/23/2011  . Bipolar affective disorder  ([place]) 09/23/2011  . TIA (transient ischemic attack) 09/23/2011  . Chronic pain 09/23/2011  . DNR (do not resuscitate) 09/23/2011  . CVA (cerebral infarction) 09/23/2011  . 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 History of alcohol abuse 09/23/2011  . Benign prostatic hyperplasia   . Left ventricular systolic dysfunction   . Chronic systolic CHF (congestive heart failure) ([place]) 05/05/2011  . Edema 01/03/2011  . CAD (coronary artery disease) 05/18/2010  . Hyperlipidemia 05/18/2010  . Other abnormality of urination(788.69) 11/25/2009  . Other specified forms of chronic ischemic heart disease 05/19/2009  . CAD, ARTERY BYPASS GRAFT 11/18/2008  . AMI, INFERIOR WALL 09/30/2008  . Obstructive sleep apnea 09/16/2008  . SINUS TACHYCARDIA 09/02/2008  . DM (diabetes mellitus), type 2 with peripheral vascular complications ([place]) 36/64/4034  . OBESITY 09/01/2008  . Hypertensive heart disease with heart failure ([place]) 09/01/2008  . TOBACCO ABUSE 07/31/2007  . 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0 3.6  BUN 25* 18 19  CREATININE 0.9 0.8 1.0  Recent Labs  11/12/15 06/08/16  AST 30 14  ALT 14 16  ALKPHOS 29 24*    Recent Labs  11/12/15 06/08/16  WBC 6.0 6.7  6.7  HGB 14.3 12.8*  12.8*  HCT 44 40*  40*  PLT 203 211  211    Recent Labs  11/12/15 06/09/16  CHOL 172 134    LDLCALC  --  42  TRIG 611* 295*   Lab Results  Component Value Date   MICROALBUR >42.6 04/09/2016   Lab Results  Component Value Date   TSH 1.60 06/09/2016   Lab Results  Component Value Date   HGBA1C 8 06/08/2016   Lab Results  Component Value Date   CHOL 134 06/09/2016   HDL 33 (A) 06/09/2016   LDLCALC 42 06/09/2016   LDLDIRECT 85.2 06/13/2012   TRIG 295 (A) 06/09/2016   CHOLHDL 7 06/13/2012    Significant Diagnostic Results in last 30 days:  No results found.  Assessment and Plan  DEMENTIA with behavioral disturbance-stable, no major declines, continue Aricept 10 mg by mouth daily  PERIPHERAL neuropathy-nursing does not report any pain; plan to continue Neurontin 400 mg 3 times a day  Diabetes mellitus type 2 with complications-solution Z6X is 8; patient is due for labs soon projection; patient is extremely noncompliant with food; plan to continue Glucophage 1000 mg by mouth twice a day and Levemir 60 units daily and NovoLog 5 units with each meal.   Victor Delaine. [name], MD

## 2016-10-29 ENCOUNTER — Encounter: Payer: Self-pay | Admitting: Internal Medicine

## 2016-10-29 DIAGNOSIS — E118 Type 2 diabetes mellitus with unspecified complications: Secondary | ICD-10-CM | POA: Insufficient documentation

## 2016-10-29 NOTE — Assessment & Plan Note (Signed)
Reports of infections or problems; plan to continue Cardura 1 mg by mouth daily

## 2016-10-29 NOTE — Assessment & Plan Note (Signed)
Nursing isn't on the patient has any pain. Plan to continue Neurontin 400 mg by mouth 3 times a day

## 2016-10-29 NOTE — Assessment & Plan Note (Signed)
No reports of mood instability; plan to continue Zyprexa 2.5 mg by mouth daily at bedtime and Lamictal 125 mg by mouth daily

## 2016-10-29 NOTE — Assessment & Plan Note (Signed)
Stable, no major declines; continue Aricept 10 mg by mouth daily

## 2016-10-29 NOTE — Assessment & Plan Note (Signed)
Most recent A1c is 8; patient is due for lab sends; patient is extremely noncompliant with foods; plan to continue Glucophage 1000 mg by mouth twice a day and Levemir 60 units daily and NovoLog 5 units with each meal

## 2016-10-29 NOTE — Assessment & Plan Note (Signed)
At baseline; plan to continue trazodone 50 mg by mouth daily at bedtime

## 2016-10-31 IMAGING — DX DG CHEST 2V
2 series · 2 of 2 positions shown · non-contrast
Comparison: January 29, 2015

CLINICAL DATA: Status post PICC pacemaker insertion

EXAM:
CHEST  2 VIEW

[chest lat]
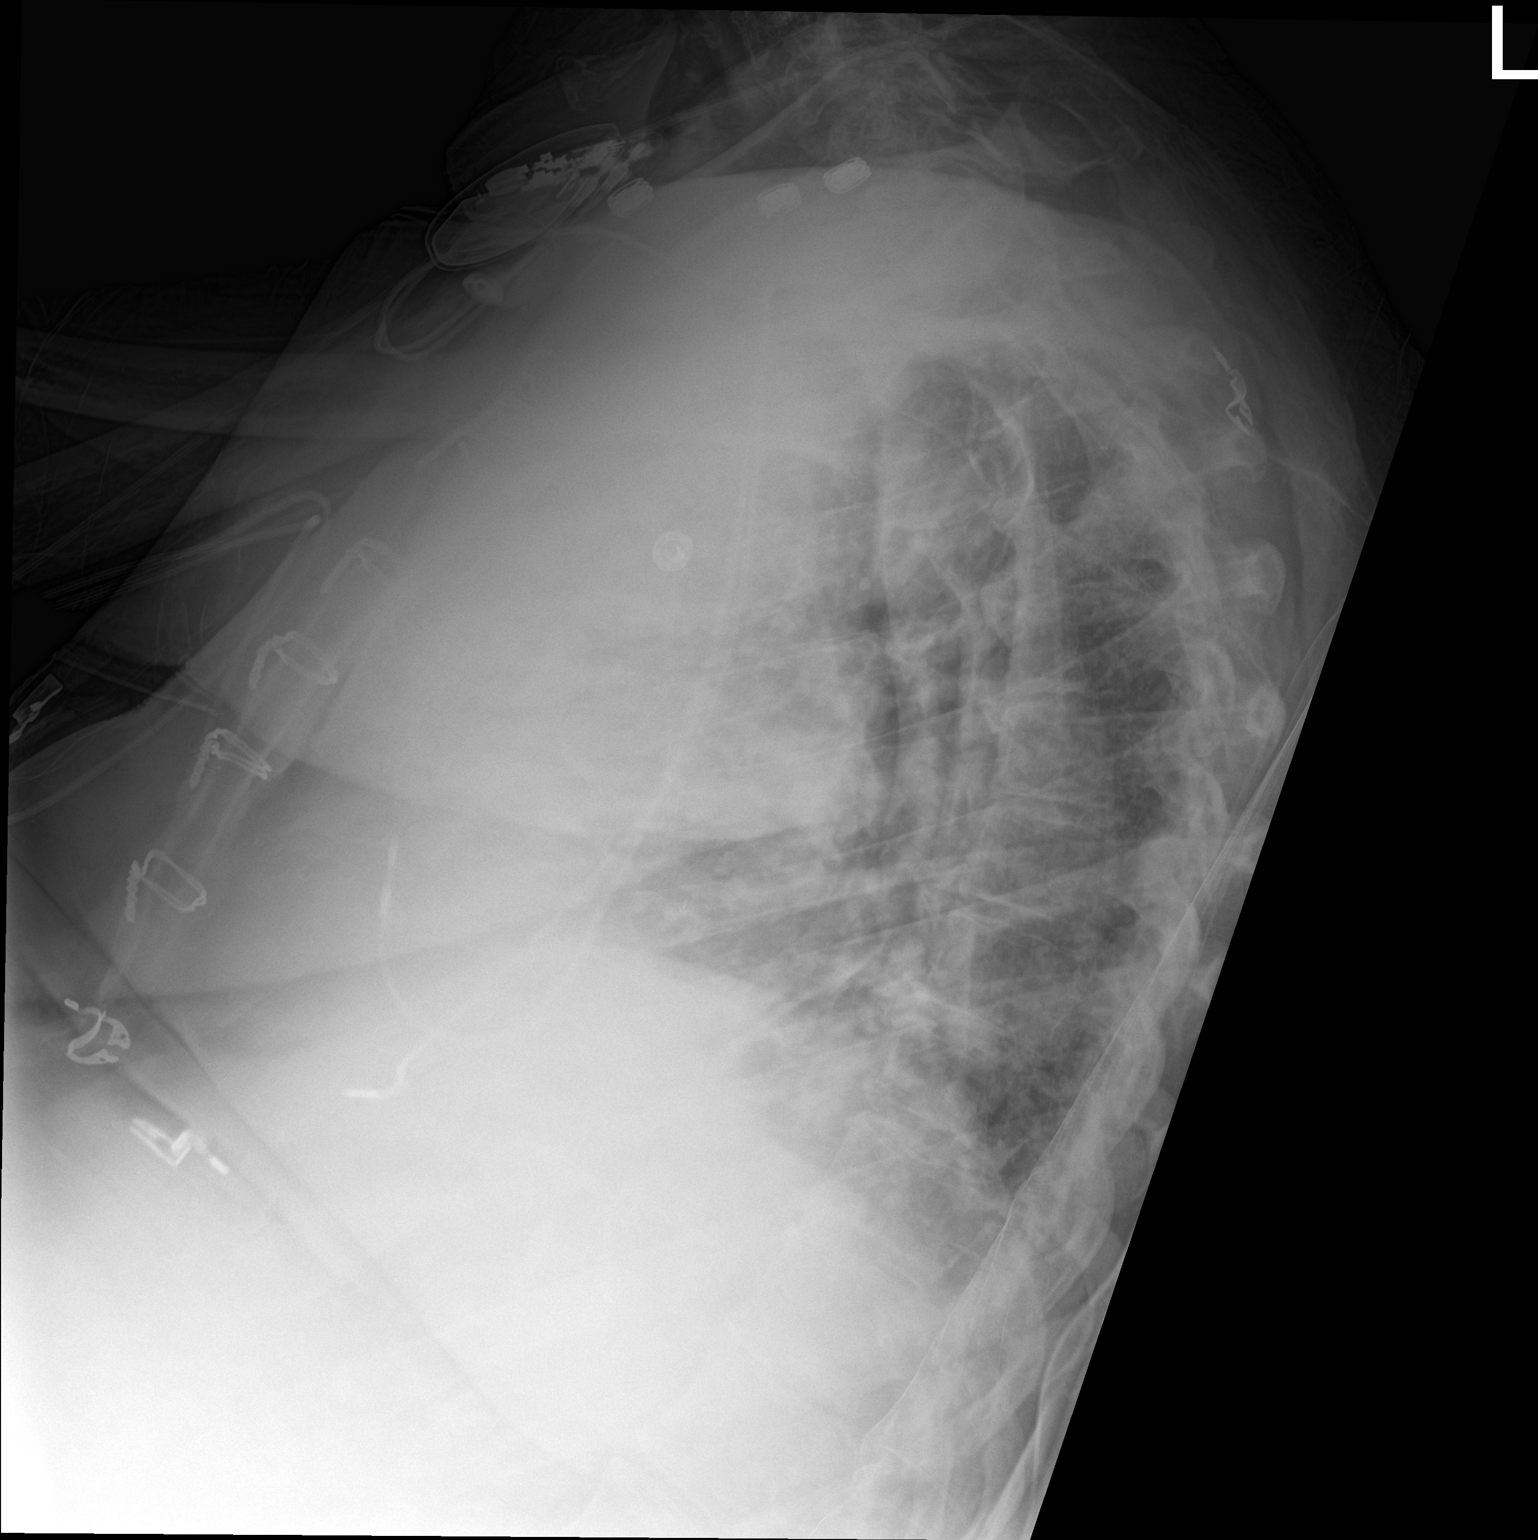

[chest ap]
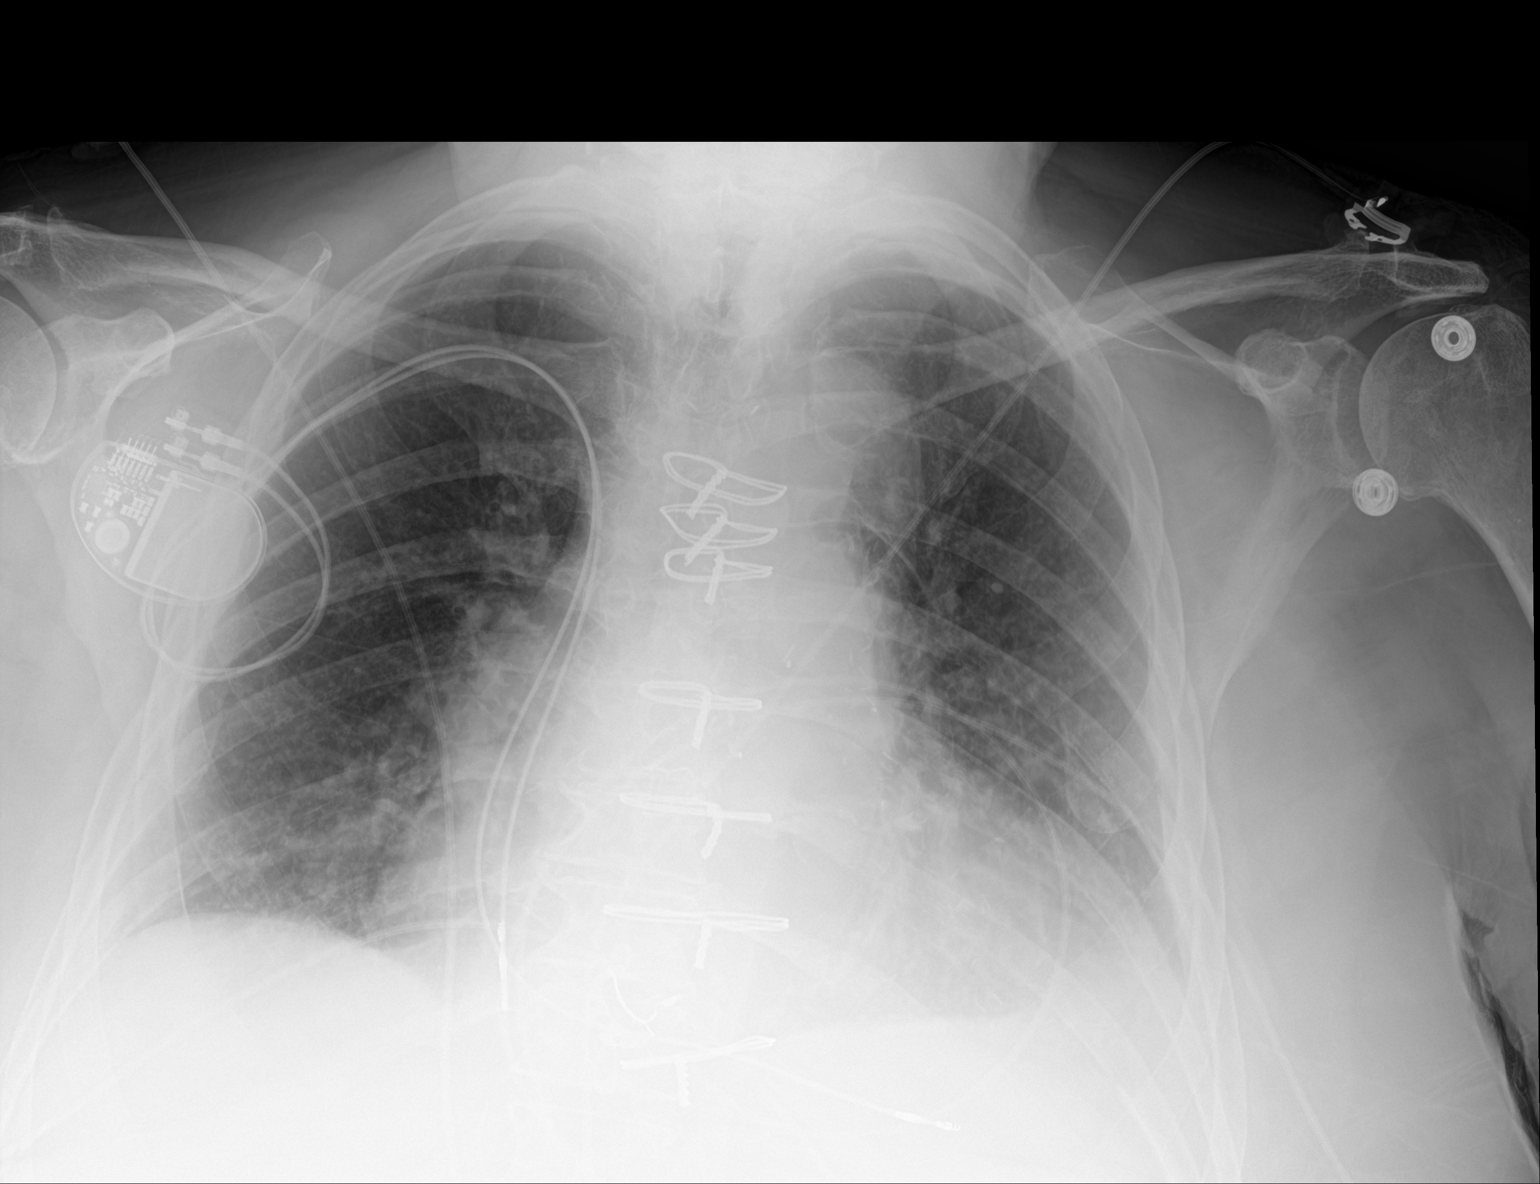

[2 of 2 positions shown; findings below may reference images not displayed]

FINDINGS: The left-sided pacemaker has been removed. There is a new
right-sided pacemaker. There appear to be distal tips in the right
atrium and right ventricle. Cardiomegaly remains. The hila and
mediastinum are unchanged. No pneumothorax. No pulmonary nodules or
masses. Minimal atelectasis suspected on the left. No other interval
changes.
IMPRESSION: Interval placement of right pacemaker with no pneumothorax.

## 2016-11-04 ENCOUNTER — Encounter: Payer: Self-pay | Admitting: Cardiology

## 2016-11-21 LAB — HEMOGLOBIN A1C: HEMOGLOBIN A1C: 9.5

## 2016-11-25 ENCOUNTER — Other Ambulatory Visit: Payer: Self-pay

## 2016-12-05 ENCOUNTER — Encounter: Payer: Self-pay | Admitting: Internal Medicine

## 2016-12-05 ENCOUNTER — Non-Acute Institutional Stay (SKILLED_NURSING_FACILITY): Payer: Medicare Other | Admitting: Internal Medicine

## 2016-12-05 DIAGNOSIS — I25708 Atherosclerosis of coronary artery bypass graft(s), unspecified, with other forms of angina pectoris: Secondary | ICD-10-CM | POA: Diagnosis not present

## 2016-12-05 DIAGNOSIS — I110 Hypertensive heart disease with heart failure: Secondary | ICD-10-CM

## 2016-12-05 DIAGNOSIS — I5022 Chronic systolic (congestive) heart failure: Secondary | ICD-10-CM

## 2016-12-05 NOTE — Progress Notes (Signed)
Opened in error

## 2016-12-05 NOTE — Progress Notes (Signed)
Location:  [facility] Room Number: [phone] Place of Service:  SNF (31)  [name], MD  Patient Care Team: [name], MD as PCP - General (Internal Medicine) [name], MD as Consulting Physician (Cardiology) [name], MD as Consulting Physician (Neurology)  Extended Emergency Contact Information Primary Emergency Contact: [name],[name] Address: [address]          [place], [place] [address] [name] of [place] Phone: [phone] Mobile Phone: [phone] Relation: Spouse Secondary Emergency Contact: [name], [name] of [place] Phone: [phone] Mobile Phone: [phone] Relation: Daughter    Allergies: Benzedrine [amphetamine]; Dexedrine [dextroamphetamine sulfate er]; Oxycodone; and Vancomycin  Chief Complaint  Patient presents with  . Medical Management of Chronic Issues    routine visit    HPI: Patient is 76 y.o. male who is being seen for routine issues of coronary artery disease, congestive heart failure, and hypertension.  Past Medical History:  Diagnosis Date  . 2nd degree AV block    a. s/p STJ dual chamber pacemaker 11/2014  . Acute respiratory failure ([place]) 01/26/2015  . AKI (acute kidney injury) ([place]) 01/26/2015  . AMI, INFERIOR WALL 09/30/2008   Qualifier: Diagnosis of  By: Unk Lightning, RN, BSN, Melanie    . Benign neoplasm of colon     Adenomatous polyps  . Benign prostatic hyperplasia   . Bipolar affective disorder ([place])    . Bipolar depression ([place]) 05/10/2015  . BPH (benign prostatic hypertrophy)   . CAD (coronary artery disease)    a. s/p CABG  . CAD, ARTERY BYPASS GRAFT 11/18/2008   Qualifier: Diagnosis of  By: Angelena Form, MD, Harrell Gave    . Chronic pain    . CVA (cerebral infarction)     Small left occipital  . Dementia with behavioral disturbance 02/15/2015  . Depression    . Diabetic peripheral neuropathy ([place]) 09/28/2011  . DM       . DM (diabetes mellitus), type 2 with peripheral vascular complications ([place]) 01/18/5850   Qualifier: Diagnosis of  By: Burnett Kanaris    . Gout    . History of alcohol abuse    . Hyperlipidemia    . HYPERTENSION        . Hypertensive heart disease with heart failure ([place]) 09/01/2008   Qualifier: Diagnosis of  By: Burnett Kanaris    . Insomnia 04/19/2012  . Ischemic cardiomyopathy    . Narcolepsy    . Obesity   . OBESITY 09/01/2008   Qualifier: Diagnosis of  By: Burnett Kanaris    . OBSTRUCTIVE SLEEP APNEA        . Obstructive sleep apnea 09/16/2008   Qualifier: Diagnosis of  By: [name] MD, [name]   . Pacemaker 12/12/2014  . Parkinsonism ([place]) 09/30/2015  . [facility] spotted fever   . SINUS TACHYCARDIA 09/02/2008   Qualifier: Diagnosis of  By: Caryl Comes, MD, [name]   . TIA (transient ischemic attack)    . TOBACCO ABUSE 07/31/2007   Qualifier: Diagnosis of  By: [name] MD, [name]   . Tremor 08/11/2015  . UNIVERSAL ULCERATIVE COLITIS        . Vitamin D deficiency 01/07/2015    Past Surgical History:  Procedure Laterality Date  . CARDIAC CATHETERIZATION     Triple-vessel coronary artery disease. Low-normal left ventricular systolic function with mild   anteroapical wall motion abnormality.   [name] Kitchen CARDIAC  CATHETERIZATION N/A 01/28/2015   Procedure: Temporary Pacemaker;  Surgeon: [name], MD;  Location: [facility];  Service: Cardiovascular;  Laterality: N/A;  . CORONARY ARTERY BYPASS GRAFT     Median sternotomy, extracorporeal circulation,  coronary artery bypass graft surgery x4 using a sequential left internal   mammary artery graft to the mid and distal left anterior descending, a   saphenous vein graft to diagonal branch of the left anterior descending,   and a saphenous vein graft to the obtuse marginal branch of left   circumflex coronary artery.    . EP IMPLANTABLE DEVICE N/A 12/12/2014   Procedure: Pacemaker Implant;  Surgeon: [name],  MD;  Location: [facility];  Service: Cardiovascular;  Laterality: N/A;  . EP IMPLANTABLE DEVICE N/A 01/28/2015   Procedure: PPM Generator Removal;  Surgeon: [name], MD;  Location: [facility];  Service: Cardiovascular;  Laterality: N/A;  . EP IMPLANTABLE DEVICE N/A 03/20/2015   Procedure: Pacemaker Implant;  Surgeon: [name], MD;  Location: [facility];  Service: Cardiovascular;  Laterality: N/A;  . FLEXIBLE SIGMOIDOSCOPY  01/17/2012   Procedure: FLEXIBLE SIGMOIDOSCOPY;  Surgeon: Lafayette Dragon, MD;  Location: [facility];  Service: Endoscopy;  Laterality: N/A;  . MULTIPLE EXTRACTIONS WITH ALVEOLOPLASTY  12/01/2011   Procedure: MULTIPLE EXTRACION WITH ALVEOLOPLASTY;  Surgeon: Lenn Cal, DDS;  Location: [facility];  Service: Oral Surgery;  Laterality: N/A;  Extraction of tooth #'s 3,4,5,6,7,8,9,10,11,12,13,14,15,16,17,20,21,22,23,24,25,26,27,28,29,30,31,32 with alveoloplasty and bilateral mandibular lingual tori  . TONSILECTOMY, ADENOIDECTOMY, BILATERAL MYRINGOTOMY AND TUBES  1948    Allergies as of 12/05/2016      Reactions   Benzedrine [amphetamine] Nausea Only   Dexedrine [dextroamphetamine Sulfate Er] Other (See Comments)   agitation   Oxycodone Nausea And Vomiting   Vancomycin    Unknown - per [facility]      Medication List        Accurate as of 12/05/16 11:59 PM. Always use your most recent med list.          acetaminophen 650 MG suppository Commonly known as:  TYLENOL Place 650 mg rectally every 6 (six) hours as needed for fever. For fever above 100F   aspirin 81 MG chewable tablet Chew 81 mg by mouth at bedtime.   carbidopa-levodopa 25-100 MG tablet Commonly known as:  SINEMET IR Take 2 tablets by mouth 3 (three) times daily with meals.   carboxymethylcellulose 0.5 % Soln Commonly known as:  REFRESH PLUS Place 1 drop into both eyes daily as needed.   carvedilol 3.125 MG tablet Commonly known as:  COREG Take 3.125 mg by mouth 2 (two)  times daily with a meal. Hold for SBP less than 105   clonazePAM 0.5 MG tablet Commonly known as:  KLONOPIN Take 0.5 mg by mouth at bedtime.   docusate sodium 100 MG capsule Commonly known as:  COLACE Take 100 mg by mouth daily. Hold for > 2 loose stools in a day   donepezil 10 MG tablet Commonly known as:  ARICEPT Take 10 mg by mouth at bedtime.   doxazosin 1 MG tablet Commonly known as:  CARDURA Take 1 mg by mouth at bedtime.   fenofibrate 145 MG tablet Commonly known as:  TRICOR Take 145 mg by mouth daily. With a meal   Fish Oil 1000 MG Caps Take 2 capsules by mouth 2 (two) times daily.   furosemide 20 MG tablet Commonly known as:  LASIX Take 40  mg by mouth daily.   gabapentin 400 MG capsule Commonly known as:  NEURONTIN 400 mg 3 (three) times daily. 400   HUMALOG KWIKPEN 100 UNIT/ML KiwkPen Generic drug:  insulin lispro Inject 8 Units 3 (three) times daily into the skin. Hold for CBG  < 200. ( to be given in addition to sliding scale) Check FSBS before meals and at bedtime. SSI 201-25 4 units, 251-300 6 units, 301-350 8 units, 351-400 10 units   ketoconazole 2 % cream Commonly known as:  NIZORAL Apply 1 application topically as directed. Apply to face/neck as needed   lamoTRIgine 25 MG tablet Commonly known as:  LAMICTAL Take 25 mg by mouth daily. give one tab with Lamictal 100 mg to equal 125 mg daily   lamoTRIgine 100 MG tablet Commonly known as:  LAMICTAL Take 100 mg by mouth daily. Give 100 mg tablet along with 25 mg tablet to equal 125 mg total   LEVEMIR FLEXTOUCH 100 UNIT/ML Pen Generic drug:  Insulin Detemir Inject 60 Units daily into the skin. At bedtime   magnesium oxide 400 MG tablet Commonly known as:  MAG-OX Take 400 mg by mouth daily.   metFORMIN 1000 MG tablet Commonly known as:  GLUCOPHAGE Take 1,000 mg by mouth 2 (two) times daily.   multivitamin tablet Take 1 tablet by mouth daily.   nitroGLYCERIN 0.4 MG SL tablet Commonly known  as:  NITROSTAT Place 0.4 mg under the tongue every 5 (five) minutes as needed for chest pain.   OLANZapine 2.5 MG tablet Commonly known as:  ZYPREXA Take 2.5 mg by mouth at bedtime.   OXYGEN Inhale 2 L into the lungs continuous. To keep spo2 above 90%   polyethylene glycol packet Commonly known as:  MIRALAX / GLYCOLAX Take 17 g by mouth daily.   pravastatin 40 MG tablet Commonly known as:  PRAVACHOL Take 40 mg by mouth at bedtime.   TOUJEO MAX SOLOSTAR 300 UNIT/ML Sopn Generic drug:  Insulin Glargine Inject into the skin. Inject 40 units daily   traMADol 50 MG tablet Commonly known as:  ULTRAM Take 50 mg by mouth as directed. Every shift for pain   traZODone 50 MG tablet Commonly known as:  DESYREL Take 50 mg by mouth at bedtime.       No orders of the defined types were placed in this encounter.   Immunization History  Administered Date(s) Administered  . Influenza-Unspecified 10/28/2016  . PPD Test 03/20/2013    Social History   Tobacco Use  . Smoking status: Former Smoker    Packs/day: 2.00    Years: 50.00    Pack years: 100.00    Last attempt to quit: 08/18/2014    Years since quitting: 2.3  . Smokeless tobacco: Former Network engineer Use Topics  . Alcohol use: No    Comment: quit 2003    Review of Systems  DATA OBTAINED: from nurse GENERAL:  no fevers, fatigue, appetite changes SKIN: No itching, rash HEENT: No complaint RESPIRATORY: No cough, wheezing, SOB CARDIAC: No chest pain, palpitations, lower extremity edema  GI: No abdominal pain, No N/V/D or constipation, No heartburn or reflux  GU: No dysuria, frequency or urgency, or incontinence  MUSCULOSKELETAL: No unrelieved bone/joint pain NEUROLOGIC: No headache, dizziness  PSYCHIATRIC: No overt anxiety or sadness  Vitals:   12/05/16 1215  BP: 128/77  Pulse: 65  Resp: 20  Temp: (!) 97 F (36.1 C)  SpO2: 96%   Body mass index is 39.48 kg/m. Physical  Exam  GENERAL APPEARANCE: Alert,  minimally conversant, No acute distress  SKIN: No diaphoresis rash HEENT: Unremarkable RESPIRATORY: Breathing is even, unlabored. Lung sounds are clear   CARDIOVASCULAR: Heart RRR no murmurs, rubs or gallops. No peripheral edema  GASTROINTESTINAL: Abdomen is soft, non-tender, not distended w/ normal bowel sounds.  GENITOURINARY: Bladder non tender, not distended  MUSCULOSKELETAL: No abnormal joints or musculature NEUROLOGIC: Cranial nerves 2-12 grossly intact. Moves all extremities; tremor bilaterally; patient bedbound by choice PSYCHIATRIC: Mood and affect flat and with some dementia, no behavioral issues  Patient Active Problem List   Diagnosis Date Noted  . Controlled diabetes mellitus type 2 with complications ([place]) 22/02/5425  . Aspiration pneumonia ([place]) 09/30/2015  . Parkinsonism ([place]) 09/30/2015  . Tremor 08/11/2015  . Suicidal ideation 05/10/2015  . Bipolar depression ([place]) 05/10/2015  . Complete heart block ([place]) 03/20/2015  . Bradycardia 03/14/2015  . Dementia with behavioral disturbance 02/15/2015  . Staphylococcus aureus bacteremia with sepsis ([place]) 01/28/2015  . Infection of pacemaker pocket ([place]) 01/28/2015  . Acute respiratory failure ([place]) 01/26/2015  . AKI (acute kidney injury) ([place]) 01/26/2015  . Acute encephalopathy 01/26/2015  . Altered mental status   . Vitamin D deficiency 01/07/2015  . Mobitz type 2 second degree atrioventricular block 12/12/2014  . Pacemaker 12/12/2014  . Symptomatic bradycardia 12/03/2014  . Lower back pain 06/12/2012  . Right hip pain 06/12/2012  . Intertrigo 05/09/2012  . Insomnia 04/19/2012  . General weakness 03/07/2012  . 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 Diarrhea 01/17/2012  . Pyelonephritis 01/15/2012  . Diabetic peripheral neuropathy ([place]) 09/28/2011  . Urinary incontinence 09/28/2011  . Preventative health care 09/23/2011  . Gout 09/23/2011  . Bipolar affective disorder ([place]) 09/23/2011  . TIA  (transient ischemic attack) 09/23/2011  . Chronic pain 09/23/2011  . DNR (do not resuscitate) 09/23/2011  . CVA (cerebral infarction) 09/23/2011  . 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 History of alcohol abuse 09/23/2011  . Benign prostatic hyperplasia   . Left ventricular systolic dysfunction   . Chronic systolic CHF (congestive heart failure) ([place]) 05/05/2011  . Edema 01/03/2011  . CAD (coronary artery disease) 05/18/2010  . Hyperlipidemia 05/18/2010  . Other abnormality of urination(788.69) 11/25/2009  . Other specified forms of chronic ischemic heart disease 05/19/2009  . CAD, ARTERY BYPASS GRAFT 11/18/2008  . AMI, INFERIOR WALL 09/30/2008  . Obstructive sleep apnea 09/16/2008  . SINUS TACHYCARDIA 09/02/2008  . DM (diabetes mellitus), type 2 with peripheral vascular complications ([place]) 07/10/7626  . OBESITY 09/01/2008  . Hypertensive heart disease with heart failure ([place]) 09/01/2008  . TOBACCO ABUSE 07/31/2007  . 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    CMP     Component Value Date/Time   NA 141 07/22/2016   K 3.6 07/22/2016   CL 102 03/20/2015 1038   CO2 28 03/20/2015 1038   GLUCOSE 171 (H) 03/20/2015 1038   BUN 19 07/22/2016   CREATININE 1.0 07/22/2016   CREATININE 1.23 03/20/2015 1038   CREATININE 1.06 12/05/2014 1536   CALCIUM 9.6 03/20/2015 1038   PROT 5.8 (L) 02/01/2015 0245   ALBUMIN 2.2 (L) 02/01/2015 0245   AST 14 06/08/2016   ALT 16 06/08/2016   ALKPHOS 24 (A) 06/08/2016   BILITOT 0.4 02/01/2015 0245   GFRNONAA 55 (L) 03/20/2015 1038   GFRAA >60 03/20/2015 1038   Recent Labs    02/10/16 06/08/16 07/22/16  NA 138 139 141  K 4.3 4.0 3.6  BUN 25* 18 19  CREATININE 0.9 0.8 1.0  Recent Labs    06/08/16  AST 14  ALT 16  ALKPHOS 24*   Recent Labs    06/08/16  WBC 6.7  6.7  HGB 12.8*  12.8*  HCT 40*  40*  PLT 211  211   Recent Labs    06/09/16  CHOL 134  LDLCALC 42  TRIG 295*   Lab Results  Component Value Date   MICROALBUR >42.6  04/09/2016   Lab Results  Component Value Date   TSH 1.60 06/09/2016   Lab Results  Component Value Date   HGBA1C 9.5 11/21/2016   Lab Results  Component Value Date   CHOL 134 06/09/2016   HDL 33 (A) 06/09/2016   LDLCALC 42 06/09/2016   LDLDIRECT 85.2 06/13/2012   TRIG 295 (A) 06/09/2016   CHOLHDL 7 06/13/2012    Significant Diagnostic Results in last 30 days:  No results found.  Assessment and Plan  CAD (coronary artery disease) No reports of chest pain; plan to continue ASA 81 mg by mouth daily, Coreg 3.125 mg by mouth twice a day, statin, and nitroglycerin SL when necessary   Chronic systolic CHF (congestive heart failure) (HCC) Stable without exacerbation; plan to continue Coreg 3.125 mg by mouth daily, and Lasix 40 mg by mouth daily  Hypertensive heart disease with heart failure (HCC) Controlled; plan to continue Lasix 40 mg by mouth daily, Cardura 1 mg by mouth daily at bedtime and Coreg 3.125 mg by mouth twice a day    Anne D. [name], MD

## 2016-12-11 ENCOUNTER — Encounter: Payer: Self-pay | Admitting: Internal Medicine

## 2016-12-11 NOTE — Assessment & Plan Note (Signed)
Controlled; plan to continue Lasix 40 mg by mouth daily, Cardura 1 mg by mouth daily at bedtime and Coreg 3.125 mg by mouth twice a day

## 2016-12-11 NOTE — Assessment & Plan Note (Signed)
Stable without exacerbation; plan to continue Coreg 3.125 mg by mouth daily, and Lasix 40 mg by mouth daily

## 2016-12-11 NOTE — Assessment & Plan Note (Signed)
No reports of chest pain; plan to continue ASA 81 mg by mouth daily, Coreg 3.125 mg by mouth twice a day, statin, and nitroglycerin SL when necessary

## 2016-12-30 ENCOUNTER — Encounter: Payer: Self-pay | Admitting: Internal Medicine

## 2016-12-30 ENCOUNTER — Non-Acute Institutional Stay (SKILLED_NURSING_FACILITY): Payer: Medicare Other | Admitting: Internal Medicine

## 2016-12-30 DIAGNOSIS — E1151 Type 2 diabetes mellitus with diabetic peripheral angiopathy without gangrene: Secondary | ICD-10-CM

## 2016-12-30 DIAGNOSIS — I739 Peripheral vascular disease, unspecified: Secondary | ICD-10-CM

## 2016-12-30 DIAGNOSIS — G218 Other secondary parkinsonism: Secondary | ICD-10-CM

## 2016-12-30 NOTE — Progress Notes (Signed)
Location:  [facility] Room Number: [place] of Service:  SNF [phone])  [place] Duos, MD  Patient Care Team: Victor Duos, MD as PCP - General (Internal Medicine) [name], MD as Consulting Physician (Cardiology) [name], MD as Consulting Physician (Neurology)  Extended Emergency Contact Information Primary Emergency Contact: [name],Victor Address: [address]          [place], [place] [address] Johnnette [name] of [place] Phone: [phone] Mobile Phone: [phone] Relation: Spouse Secondary Emergency Contact: [name], [name] of [place] Phone: [phone] Mobile Phone: [phone] Relation: Daughter    Allergies: Benzedrine [amphetamine]; Dexedrine [dextroamphetamine sulfate er]; Oxycodone; and Vancomycin  Chief Complaint  Patient presents with  . Medical Management of Chronic Issues    routine visit    HPI: Patient is 76 y.o. male who is being seen for routine issues of peripheral vascular disease, diabetes mellitus type 2, and parkinsonism   Past Medical History:  Diagnosis Date  . 2nd degree AV block    a. s/p STJ dual chamber pacemaker 11/2014  . Acute respiratory failure ([place]) 01/26/2015  . AKI (acute kidney injury) ([place]) 01/26/2015  . AMI, INFERIOR WALL 09/30/2008   Qualifier: Diagnosis of  By: Unk Lightning, RN, BSN, Melanie    . Benign neoplasm of colon     Adenomatous polyps  . Benign prostatic hyperplasia   . Bipolar affective disorder ([place])    . Bipolar depression ([place]) 05/10/2015  . BPH (benign prostatic hypertrophy)   . CAD (coronary artery disease)    a. s/p CABG  . CAD, ARTERY BYPASS GRAFT 11/18/2008   Qualifier: Diagnosis of  By: Angelena Form, MD, Harrell Gave    . Chronic pain    . CVA (cerebral infarction)     Small left occipital  . Dementia with behavioral disturbance 02/15/2015  . Depression    . Diabetic peripheral neuropathy ([place]) 09/28/2011  . DM          . DM (diabetes mellitus), type 2 with peripheral vascular complications ([place]) 1/96/2229   Qualifier: Diagnosis of  By: Burnett Kanaris    . Gout    . History of alcohol abuse    . Hyperlipidemia    . HYPERTENSION        . Hypertensive heart disease with heart failure ([place]) 09/01/2008   Qualifier: Diagnosis of  By: Burnett Kanaris    . Insomnia 04/19/2012  . Ischemic cardiomyopathy    . Narcolepsy    . Obesity   . OBESITY 09/01/2008   Qualifier: Diagnosis of  By: Burnett Kanaris    . OBSTRUCTIVE SLEEP APNEA        . Obstructive sleep apnea 09/16/2008   Qualifier: Diagnosis of  By: [name] MD, [name]   . Pacemaker 12/12/2014  . Parkinsonism ([place]) 09/30/2015  . [facility] spotted fever   . SINUS TACHYCARDIA 09/02/2008   Qualifier: Diagnosis of  By: Caryl Comes, MD, [name]   . TIA (transient ischemic attack)    . TOBACCO ABUSE 07/31/2007   Qualifier: Diagnosis of  By: [name] MD, [name]   . Tremor 08/11/2015  . UNIVERSAL ULCERATIVE COLITIS        . Vitamin D deficiency 01/07/2015    Past Surgical History:  Procedure Laterality Date  . CARDIAC CATHETERIZATION     Triple-vessel coronary artery disease. Low-normal left ventricular systolic function with mild   anteroapical wall motion  abnormality.   [name] Kitchen CARDIAC CATHETERIZATION N/A 01/28/2015   Procedure: Temporary Pacemaker;  Surgeon: [name], MD;  Location: [facility];  Service: Cardiovascular;  Laterality: N/A;  . CORONARY ARTERY BYPASS GRAFT     Median sternotomy, extracorporeal circulation,  coronary artery bypass graft surgery x4 using a sequential left internal   mammary artery graft to the mid and distal left anterior descending, a   saphenous vein graft to diagonal branch of the left anterior descending,   and a saphenous vein graft to the obtuse marginal branch of left   circumflex coronary artery.    . EP IMPLANTABLE DEVICE N/A 12/12/2014   Procedure: Pacemaker Implant;  Surgeon: [name], MD;  Location: [facility];  Service: Cardiovascular;  Laterality: N/A;  . EP IMPLANTABLE DEVICE N/A 01/28/2015   Procedure: PPM Generator Removal;  Surgeon: [name], MD;  Location: [facility];  Service: Cardiovascular;  Laterality: N/A;  . EP IMPLANTABLE DEVICE N/A 03/20/2015   Procedure: Pacemaker Implant;  Surgeon: [name], MD;  Location: [facility];  Service: Cardiovascular;  Laterality: N/A;  . FLEXIBLE SIGMOIDOSCOPY  01/17/2012   Procedure: FLEXIBLE SIGMOIDOSCOPY;  Surgeon: Lafayette Dragon, MD;  Location: [facility];  Service: Endoscopy;  Laterality: N/A;  . MULTIPLE EXTRACTIONS WITH ALVEOLOPLASTY  12/01/2011   Procedure: MULTIPLE EXTRACION WITH ALVEOLOPLASTY;  Surgeon: Lenn Cal, DDS;  Location: [facility];  Service: Oral Surgery;  Laterality: N/A;  Extraction of tooth #'s 3,4,5,6,7,8,9,10,11,12,13,14,15,16,17,20,21,22,23,24,25,26,27,28,29,30,31,32 with alveoloplasty and bilateral mandibular lingual tori  . TONSILECTOMY, ADENOIDECTOMY, BILATERAL MYRINGOTOMY AND TUBES  1948    Allergies as of 12/30/2016      Reactions   Benzedrine [amphetamine] Nausea Only   Dexedrine [dextroamphetamine Sulfate Er] Other (See Comments)   agitation   Oxycodone Nausea And Vomiting   Vancomycin    Unknown - per [facility]      Medication List        Accurate as of 12/30/16 11:59 PM. Always use your most recent med list.          aspirin 81 MG chewable tablet Chew 81 mg by mouth at bedtime.   carbidopa-levodopa 25-100 MG tablet Commonly known as:  SINEMET IR Take 2 tablets by mouth 3 (three) times daily with meals.   carvedilol 3.125 MG tablet Commonly known as:  COREG Take 3.125 mg by mouth 2 (two) times daily with a meal. Hold for SBP less than 105   clonazePAM 0.5 MG tablet Commonly known as:  KLONOPIN Take 0.5 mg by mouth at bedtime.   docusate sodium 100 MG capsule Commonly known as:  COLACE Take 100 mg by mouth daily. Hold for > 2 loose  stools in a day   donepezil 10 MG tablet Commonly known as:  ARICEPT Take 10 mg by mouth at bedtime.   doxazosin 1 MG tablet Commonly known as:  CARDURA Take 1 mg by mouth at bedtime.   fenofibrate 145 MG tablet Commonly known as:  TRICOR Take 145 mg by mouth daily. With a meal   Fish Oil 1000 MG Caps Take 2 capsules by mouth 2 (two) times daily.   furosemide 20 MG tablet Commonly known as:  LASIX Take 40 mg by mouth daily.   gabapentin 400 MG capsule Commonly known as:  NEURONTIN 400 mg 3 (three) times daily.   HUMALOG KWIKPEN 100 UNIT/ML KiwkPen Generic drug:  insulin lispro Inject 8 Units 3 (three) times daily into the skin.  Hold for CBG  < 200. ( to be given in addition to sliding scale) Check FSBS before meals and at bedtime. SSI 201-25 4 units, 251-300 6 units, 301-350 8 units, 351-400 10 units   lamoTRIgine 25 MG tablet Commonly known as:  LAMICTAL Take 25 mg by mouth daily. give one tab with Lamictal 100 mg to equal 125 mg daily   lamoTRIgine 100 MG tablet Commonly known as:  LAMICTAL Take 100 mg by mouth daily. Give 100 mg tablet along with 25 mg tablet to equal 125 mg total   magnesium oxide 400 MG tablet Commonly known as:  MAG-OX Take 400 mg by mouth daily.   metFORMIN 1000 MG tablet Commonly known as:  GLUCOPHAGE Take 1,000 mg by mouth 2 (two) times daily.   multivitamin tablet Take 1 tablet by mouth daily.   nitroGLYCERIN 0.4 MG SL tablet Commonly known as:  NITROSTAT Place 0.4 mg under the tongue every 5 (five) minutes as needed for chest pain.   OXYGEN Inhale 2 L into the lungs continuous. To keep spo2 above 90%   polyethylene glycol packet Commonly known as:  MIRALAX / GLYCOLAX Take 17 g by mouth daily.   pravastatin 40 MG tablet Commonly known as:  PRAVACHOL Take 40 mg by mouth at bedtime.   TOUJEO MAX SOLOSTAR 300 UNIT/ML Sopn Generic drug:  Insulin Glargine Inject 40 Units into the skin daily.   traMADol 50 MG tablet Commonly  known as:  ULTRAM Take 50 mg by mouth as directed. Every shift for pain   traZODone 50 MG tablet Commonly known as:  DESYREL Take 50 mg by mouth at bedtime.       No orders of the defined types were placed in this encounter.   Immunization History  Administered Date(s) Administered  . Influenza-Unspecified 10/28/2016  . PPD Test 03/20/2013    Social History   Tobacco Use  . Smoking status: Former Smoker    Packs/day: 2.00    Years: 50.00    Pack years: 100.00    Last attempt to quit: 08/18/2014    Years since quitting: 2.4  . Smokeless tobacco: Former Network engineer Use Topics  . Alcohol use: No    Comment: quit 2003    Review of Systems  DATA OBTAINED: from patient-limited; nursing-no concerns GENERAL:  no fevers, fatigue, appetite changes SKIN: No itching, rash HEENT: No complaint RESPIRATORY: No cough, wheezing, SOB CARDIAC: No chest pain, palpitations, lower extremity edema  GI: No abdominal pain, No N/V/D or constipation, No heartburn or reflux  GU: No dysuria, frequency or urgency, or incontinence  MUSCULOSKELETAL: No unrelieved bone/joint pain NEUROLOGIC: No headache, dizziness  PSYCHIATRIC: No overt anxiety or sadness  Vitals:   12/30/16 1347  BP: 129/83  Pulse: 77  Resp: 19  Temp: 98 F (36.7 C)  SpO2: 96%   Body mass index is 39.48 kg/m. Physical Exam  GENERAL APPEARANCE: Alert, minimally conversant, No acute distress  SKIN: No diaphoresis rash HEENT: Unremarkable RESPIRATORY: Breathing is even, unlabored. Lung sounds are clear   CARDIOVASCULAR: Heart RRR no murmurs, rubs or gallops. No peripheral edema  GASTROINTESTINAL: Abdomen is soft, non-tender, not distended w/ normal bowel sounds.  GENITOURINARY: Bladder non tender, not distended  MUSCULOSKELETAL: No abnormal joints or musculature NEUROLOGIC: Cranial nerves 2-12 grossly intact. Moves all extremities PSYCHIATRIC: Mood and affect with dementia, no behavioral issues  Patient Active  Problem List   Diagnosis Date Noted  . Peripheral vascular disease ([place]) 02/04/2017  . Controlled diabetes  mellitus type 2 with complications ([place]) 03/47/4259  . Aspiration pneumonia ([place]) 09/30/2015  . Parkinsonism ([place]) 09/30/2015  . Tremor 08/11/2015  . Suicidal ideation 05/10/2015  . Bipolar depression ([place]) 05/10/2015  . Complete heart block ([place]) 03/20/2015  . Bradycardia 03/14/2015  . Dementia with behavioral disturbance 02/15/2015  . Staphylococcus aureus bacteremia with sepsis ([place]) 01/28/2015  . Infection of pacemaker pocket ([place]) 01/28/2015  . Acute respiratory failure ([place]) 01/26/2015  . AKI (acute kidney injury) ([place]) 01/26/2015  . Acute encephalopathy 01/26/2015  . Altered mental status   . Vitamin D deficiency 01/07/2015  . Mobitz type 2 second degree atrioventricular block 12/12/2014  . Pacemaker 12/12/2014  . Symptomatic bradycardia 12/03/2014  . Lower back pain 06/12/2012  . Right hip pain 06/12/2012  . Intertrigo 05/09/2012  . Insomnia 04/19/2012  . General weakness 03/07/2012  . 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 Diarrhea 01/17/2012  . Pyelonephritis 01/15/2012  . Diabetic peripheral neuropathy ([place]) 09/28/2011  . Urinary incontinence 09/28/2011  . Preventative health care 09/23/2011  . Gout 09/23/2011  . Bipolar affective disorder ([place]) 09/23/2011  . TIA (transient ischemic attack) 09/23/2011  . Chronic pain 09/23/2011  . DNR (do not resuscitate) 09/23/2011  . CVA (cerebral infarction) 09/23/2011  . 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 History of alcohol abuse 09/23/2011  . Benign prostatic hyperplasia   . Left ventricular systolic dysfunction   . Chronic systolic CHF (congestive heart failure) ([place]) 05/05/2011  . Edema 01/03/2011  . CAD (coronary artery disease) 05/18/2010  . Hyperlipidemia 05/18/2010  . Other abnormality of urination(788.69) 11/25/2009  . Other specified forms of chronic ischemic heart disease 05/19/2009  . CAD,  ARTERY BYPASS GRAFT 11/18/2008  . AMI, INFERIOR WALL 09/30/2008  . Obstructive sleep apnea 09/16/2008  . SINUS TACHYCARDIA 09/02/2008  . DM (diabetes mellitus), type 2 with peripheral vascular complications ([place]) 56/38/7564  . OBESITY 09/01/2008  . Hypertensive heart disease with heart failure ([place]) 09/01/2008  . TOBACCO ABUSE 07/31/2007  . 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    CMP     Component Value Date/Time   NA 141 07/22/2016   K 3.6 07/22/2016   CL 102 03/20/2015 1038   CO2 28 03/20/2015 1038   GLUCOSE 171 (H) 03/20/2015 1038   BUN 19 07/22/2016   CREATININE 1.0 07/22/2016   CREATININE 1.23 03/20/2015 1038   CREATININE 1.06 12/05/2014 1536   CALCIUM 9.6 03/20/2015 1038   PROT 5.8 (L) 02/01/2015 0245   ALBUMIN 2.2 (L) 02/01/2015 0245   AST 14 06/08/2016   ALT 16 06/08/2016   ALKPHOS 24 (A) 06/08/2016   BILITOT 0.4 02/01/2015 0245   GFRNONAA 55 (L) 03/20/2015 1038   GFRAA >60 03/20/2015 1038   Recent Labs    02/10/16 06/08/16 07/22/16  NA 138 139 141  K 4.3 4.0 3.6  BUN 25* 18 19  CREATININE 0.9 0.8 1.0   Recent Labs    06/08/16  AST 14  ALT 16  ALKPHOS 24*   Recent Labs    06/08/16  WBC 6.7  6.7  HGB 12.8*  12.8*  HCT 40*  40*  PLT 211  211   Recent Labs    06/09/16  CHOL 134  LDLCALC 42  TRIG 295*   Lab Results  Component Value Date   MICROALBUR >42.6 04/09/2016   Lab Results  Component Value Date   TSH 1.60 06/09/2016   Lab Results  Component Value Date   HGBA1C 9.5 11/21/2016   Lab Results  Component Value Date  CHOL 134 06/09/2016   HDL 33 (A) 06/09/2016   LDLCALC 42 06/09/2016   LDLDIRECT 85.2 06/13/2012   TRIG 295 (A) 06/09/2016   CHOLHDL 7 06/13/2012    Significant Diagnostic Results in last 30 days:  No results found.  Assessment and Plan  Peripheral vascular disease ([place]) No wounds since July 2018; plan to continue ASA 81 mg by mouth daily, statin, and fenofibrate and fish oral, and diabetes mellitus   medications; risk as uncontrolled diabetes secondary to noncompliance  DM (diabetes mellitus), type 2 with peripheral vascular complications ([facility]) This recent A1c is 9.5 which is patient's usual baseline secondary noncompliance by both the patient and patient's family; patient continues on Glucophage 1000 mg by mouth twice a day, Toujeo 40 units subcutaneous daily and is now on sliding scale insulin with meals; will continue to try to work with patient compliance  Parkinsonism ([place]) Some progression; plan to continue Sinemet IR 25-102 tabs by mouth 3 times a day    Charell Faulk D. [name], MD

## 2017-01-18 ENCOUNTER — Ambulatory Visit (INDEPENDENT_AMBULATORY_CARE_PROVIDER_SITE_OTHER): Payer: Medicare Other | Admitting: *Deleted

## 2017-01-18 DIAGNOSIS — I442 Atrioventricular block, complete: Secondary | ICD-10-CM

## 2017-01-19 NOTE — Progress Notes (Signed)
Remote pacemaker transmission.   

## 2017-01-20 ENCOUNTER — Encounter: Payer: Self-pay | Admitting: Cardiology

## 2017-01-31 ENCOUNTER — Encounter: Payer: Self-pay | Admitting: Internal Medicine

## 2017-01-31 ENCOUNTER — Non-Acute Institutional Stay (SKILLED_NURSING_FACILITY): Payer: Medicare Other | Admitting: Internal Medicine

## 2017-01-31 DIAGNOSIS — Z794 Long term (current) use of insulin: Secondary | ICD-10-CM

## 2017-01-31 DIAGNOSIS — E118 Type 2 diabetes mellitus with unspecified complications: Secondary | ICD-10-CM | POA: Diagnosis not present

## 2017-01-31 DIAGNOSIS — E782 Mixed hyperlipidemia: Secondary | ICD-10-CM | POA: Diagnosis not present

## 2017-01-31 DIAGNOSIS — E1142 Type 2 diabetes mellitus with diabetic polyneuropathy: Secondary | ICD-10-CM | POA: Diagnosis not present

## 2017-01-31 NOTE — Progress Notes (Signed)
Location:  [facility] Room Number: [place] of Service:  SNF ([phone])  [name], MD  Patient Care Team: [name], MD as PCP - General (Internal Medicine) [name], MD as Consulting Physician (Cardiology) [name], MD as Consulting Physician (Neurology)  Extended Emergency Contact Information Primary Emergency Contact: [name],[name] Address: [address]          [place], [place] [address] [name] of [place] Phone: [phone] Mobile Phone: [phone] Relation: Spouse Secondary Emergency Contact: [name], [name] of [place] Phone: [phone] Mobile Phone: [phone] Relation: Daughter    Allergies: Benzedrine [amphetamine]; Dexedrine [dextroamphetamine sulfate er]; Oxycodone; and Vancomycin  Chief Complaint  Patient presents with  . Medical Management of Chronic Issues    Routine Visit    HPI: Patient is 77 y.o. male who is being seen for routine issues of diabetes mellitus type 2, neuropathy associated with diabetes mellitus, and hyperlipidemia associated with diabetes mellitus.  Past Medical History:  Diagnosis Date  . 2nd degree AV block    a. s/p STJ dual chamber pacemaker 11/2014  . Acute respiratory failure ([place]) 01/26/2015  . AKI (acute kidney injury) ([place]) 01/26/2015  . AMI, INFERIOR WALL 09/30/2008   Qualifier: Diagnosis of  By: Unk Lightning, RN, BSN, Melanie    . Benign neoplasm of colon     Adenomatous polyps  . Benign prostatic hyperplasia   . Bipolar affective disorder ([place])    . Bipolar depression ([place]) 05/10/2015  . BPH (benign prostatic hypertrophy)   . CAD (coronary artery disease)    a. s/p CABG  . CAD, ARTERY BYPASS GRAFT 11/18/2008   Qualifier: Diagnosis of  By: Angelena Form, MD, Harrell Gave    . Chronic pain    . CVA (cerebral infarction)     Small left occipital  . Dementia with behavioral disturbance 02/15/2015  . Depression    .  Diabetic peripheral neuropathy ([place]) 09/28/2011  . DM        . DM (diabetes mellitus), type 2 with peripheral vascular complications ([place]) 7/79/3903   Qualifier: Diagnosis of  By: Burnett Kanaris    . Gout    . History of alcohol abuse    . Hyperlipidemia    . HYPERTENSION        . Hypertensive heart disease with heart failure ([place]) 09/01/2008   Qualifier: Diagnosis of  By: Burnett Kanaris    . Insomnia 04/19/2012  . Ischemic cardiomyopathy    . Narcolepsy    . Obesity   . OBESITY 09/01/2008   Qualifier: Diagnosis of  By: Burnett Kanaris    . OBSTRUCTIVE SLEEP APNEA        . Obstructive sleep apnea 09/16/2008   Qualifier: Diagnosis of  By: [name] MD, [name]   . Pacemaker 12/12/2014  . Parkinsonism ([place]) 09/30/2015  . [facility] spotted fever   . SINUS TACHYCARDIA 09/02/2008   Qualifier: Diagnosis of  By: Caryl Comes, MD, [name]   . TIA (transient ischemic attack)    . TOBACCO ABUSE 07/31/2007   Qualifier: Diagnosis of  By: [name] MD, [name]   . Tremor 08/11/2015  . UNIVERSAL ULCERATIVE COLITIS        . Vitamin D deficiency 01/07/2015    Past Surgical History:  Procedure Laterality Date  . CARDIAC CATHETERIZATION     Triple-vessel coronary artery disease. Low-normal left ventricular systolic function with mild  anteroapical wall motion abnormality.   [name] Kitchen CARDIAC CATHETERIZATION N/A 01/28/2015   Procedure: Temporary Pacemaker;  Surgeon: [name], MD;  Location: [facility];  Service: Cardiovascular;  Laterality: N/A;  . CORONARY ARTERY BYPASS GRAFT     Median sternotomy, extracorporeal circulation,  coronary artery bypass graft surgery x4 using a sequential left internal   mammary artery graft to the mid and distal left anterior descending, a   saphenous vein graft to diagonal branch of the left anterior descending,   and a saphenous vein graft to the obtuse marginal branch of left   circumflex coronary artery.    . EP IMPLANTABLE DEVICE N/A 12/12/2014     Procedure: Pacemaker Implant;  Surgeon: [name], MD;  Location: [facility];  Service: Cardiovascular;  Laterality: N/A;  . EP IMPLANTABLE DEVICE N/A 01/28/2015   Procedure: PPM Generator Removal;  Surgeon: [name], MD;  Location: [facility];  Service: Cardiovascular;  Laterality: N/A;  . EP IMPLANTABLE DEVICE N/A 03/20/2015   Procedure: Pacemaker Implant;  Surgeon: [name], MD;  Location: [facility];  Service: Cardiovascular;  Laterality: N/A;  . FLEXIBLE SIGMOIDOSCOPY  01/17/2012   Procedure: FLEXIBLE SIGMOIDOSCOPY;  Surgeon: Lafayette Dragon, MD;  Location: [facility];  Service: Endoscopy;  Laterality: N/A;  . MULTIPLE EXTRACTIONS WITH ALVEOLOPLASTY  12/01/2011   Procedure: MULTIPLE EXTRACION WITH ALVEOLOPLASTY;  Surgeon: Lenn Cal, DDS;  Location: [facility];  Service: Oral Surgery;  Laterality: N/A;  Extraction of tooth #'s 3,4,5,6,7,8,9,10,11,12,13,14,15,16,17,20,21,22,23,24,25,26,27,28,29,30,31,32 with alveoloplasty and bilateral mandibular lingual tori  . TONSILECTOMY, ADENOIDECTOMY, BILATERAL MYRINGOTOMY AND TUBES  1948    Allergies as of 01/31/2017      Reactions   Benzedrine [amphetamine] Nausea Only   Dexedrine [dextroamphetamine Sulfate Er] Other (See Comments)   agitation   Oxycodone Nausea And Vomiting   Vancomycin    Unknown - per [facility]      Medication List        Accurate as of 01/31/17 11:59 PM. Always use your most recent med list.          aspirin 81 MG chewable tablet Chew 81 mg by mouth at bedtime.   carbidopa-levodopa 25-100 MG tablet Commonly known as:  SINEMET IR Take 2 tablets by mouth 3 (three) times daily with meals.   carvedilol 3.125 MG tablet Commonly known as:  COREG Take 3.125 mg by mouth 2 (two) times daily with a meal. Hold for SBP less than 105   clonazePAM 0.5 MG tablet Commonly known as:  KLONOPIN Take 0.5 mg by mouth at bedtime.   docusate sodium 100 MG capsule Commonly known as:   COLACE Take 100 mg by mouth daily. Hold for > 2 loose stools in a day   donepezil 10 MG tablet Commonly known as:  ARICEPT Take 10 mg by mouth at bedtime.   doxazosin 1 MG tablet Commonly known as:  CARDURA Take 1 mg by mouth at bedtime.   fenofibrate 145 MG tablet Commonly known as:  TRICOR Take 145 mg by mouth daily. With a meal   Fish Oil 1000 MG Caps Take 2 capsules by mouth 2 (two) times daily.   furosemide 40 MG tablet Commonly known as:  LASIX Take 40 mg by mouth daily.   gabapentin 400 MG capsule Commonly known as:  NEURONTIN 400 mg 3 (three) times daily.   HUMALOG KWIKPEN 100 UNIT/ML KiwkPen Generic drug:  insulin lispro Inject 8 Units 3 (three) times  daily into the skin. Hold for CBG  < 200. ( to be given in addition to sliding scale) Check FSBS before meals and at bedtime. SSI 201-25 4 units, 251-300 6 units, 301-350 8 units, 351-400 10 units   lamoTRIgine 25 MG tablet Commonly known as:  LAMICTAL Take 25 mg by mouth daily. give one tab with Lamictal 100 mg to equal 125 mg daily   lamoTRIgine 100 MG tablet Commonly known as:  LAMICTAL Take 100 mg by mouth daily. Give 100 mg tablet along with 25 mg tablet to equal 125 mg total   magnesium oxide 400 MG tablet Commonly known as:  MAG-OX Take 400 mg by mouth daily.   metFORMIN 1000 MG tablet Commonly known as:  GLUCOPHAGE Take 1,000 mg by mouth 2 (two) times daily.   multivitamin tablet Take 1 tablet by mouth daily.   nitroGLYCERIN 0.4 MG SL tablet Commonly known as:  NITROSTAT Place 0.4 mg under the tongue every 5 (five) minutes as needed for chest pain.   OXYGEN Inhale 2 L into the lungs continuous. To keep spo2 above 90%   polyethylene glycol packet Commonly known as:  MIRALAX / GLYCOLAX Take 17 g by mouth daily.   pravastatin 40 MG tablet Commonly known as:  PRAVACHOL Take 40 mg by mouth at bedtime.   TOUJEO MAX SOLOSTAR 300 UNIT/ML Sopn Generic drug:  Insulin Glargine Inject 40 Units into  the skin daily.   traMADol 50 MG tablet Commonly known as:  ULTRAM Take 50 mg by mouth as directed. Every shift for pain   traZODone 50 MG tablet Commonly known as:  DESYREL Take 50 mg by mouth at bedtime.       No orders of the defined types were placed in this encounter.   Immunization History  Administered Date(s) Administered  . Influenza-Unspecified 10/28/2016  . PPD Test 03/20/2013    Social History   Tobacco Use  . Smoking status: Former Smoker    Packs/day: 2.00    Years: 50.00    Pack years: 100.00    Last attempt to quit: 08/18/2014    Years since quitting: 2.4  . Smokeless tobacco: Former Network engineer Use Topics  . Alcohol use: No    Comment: quit 2003    Review of Systems  DATA OBTAINED: from patient-limited; nursing-no acute concerns GENERAL:  no fevers, fatigue, appetite changes SKIN: No itching, rash HEENT: No complaint RESPIRATORY: No cough, wheezing, SOB CARDIAC: No chest pain, palpitations, lower extremity edema  GI: No abdominal pain, No N/V/D or constipation, No heartburn or reflux  GU: No dysuria, frequency or urgency, or incontinence  MUSCULOSKELETAL: No unrelieved bone/joint pain NEUROLOGIC: No headache, dizziness  PSYCHIATRIC: No overt anxiety or sadness  Vitals:   01/31/17 1459  BP: 136/77  Pulse: 65  Resp: 18  Temp: 98.3 F (36.8 C)  SpO2: 97%   Body mass index is 38.69 kg/m. Physical Exam  GENERAL APPEARANCE: Alert, minimally conversant, No acute distress  SKIN: No diaphoresis rash HEENT: Unremarkable RESPIRATORY: Breathing is even, unlabored. Lung sounds are clear   CARDIOVASCULAR: Heart RRR no murmurs, rubs or gallops. No peripheral edema  GASTROINTESTINAL: Abdomen is soft, non-tender, not distended w/ normal bowel sounds.  GENITOURINARY: Bladder non tender, not distended  MUSCULOSKELETAL: No abnormal joints or musculature NEUROLOGIC: Cranial nerves 2-12 grossly intact. Moves all extremities PSYCHIATRIC: Mood and  affect strange and with dementia, no behavioral issues  Patient Active Problem List   Diagnosis Date Noted  . Peripheral vascular  disease ([place]) 02/04/2017  . Controlled diabetes mellitus type 2 with complications ([place]) 55/73/2202  . Aspiration pneumonia ([place]) 09/30/2015  . Parkinsonism ([place]) 09/30/2015  . Tremor 08/11/2015  . Suicidal ideation 05/10/2015  . Bipolar depression ([place]) 05/10/2015  . Complete heart block ([place]) 03/20/2015  . Bradycardia 03/14/2015  . Dementia with behavioral disturbance 02/15/2015  . Staphylococcus aureus bacteremia with sepsis ([place]) 01/28/2015  . Infection of pacemaker pocket ([place]) 01/28/2015  . Acute respiratory failure ([place]) 01/26/2015  . AKI (acute kidney injury) ([place]) 01/26/2015  . Acute encephalopathy 01/26/2015  . Altered mental status   . Vitamin D deficiency 01/07/2015  . Mobitz type 2 second degree atrioventricular block 12/12/2014  . Pacemaker 12/12/2014  . Symptomatic bradycardia 12/03/2014  . Lower back pain 06/12/2012  . Right hip pain 06/12/2012  . Intertrigo 05/09/2012  . Insomnia 04/19/2012  . General weakness 03/07/2012  . 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 Diarrhea 01/17/2012  . Pyelonephritis 01/15/2012  . Diabetic peripheral neuropathy ([place]) 09/28/2011  . Urinary incontinence 09/28/2011  . Preventative health care 09/23/2011  . Gout 09/23/2011  . Bipolar affective disorder ([place]) 09/23/2011  . TIA (transient ischemic attack) 09/23/2011  . Chronic pain 09/23/2011  . DNR (do not resuscitate) 09/23/2011  . CVA (cerebral infarction) 09/23/2011  . 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 History of alcohol abuse 09/23/2011  . Benign prostatic hyperplasia   . Left ventricular systolic dysfunction   . Chronic systolic CHF (congestive heart failure) ([place]) 05/05/2011  . Edema 01/03/2011  . CAD (coronary artery disease) 05/18/2010  . Hyperlipidemia 05/18/2010  . Other abnormality of urination(788.69) 11/25/2009  . Other  specified forms of chronic ischemic heart disease 05/19/2009  . CAD, ARTERY BYPASS GRAFT 11/18/2008  . AMI, INFERIOR WALL 09/30/2008  . Obstructive sleep apnea 09/16/2008  . SINUS TACHYCARDIA 09/02/2008  . DM (diabetes mellitus), type 2 with peripheral vascular complications ([place]) 54/27/0623  . OBESITY 09/01/2008  . Hypertensive heart disease with heart failure ([place]) 09/01/2008  . TOBACCO ABUSE 07/31/2007  . 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    CMP     Component Value Date/Time   NA 141 07/22/2016   K 3.6 07/22/2016   CL 102 03/20/2015 1038   CO2 28 03/20/2015 1038   GLUCOSE 171 (H) 03/20/2015 1038   BUN 19 07/22/2016   CREATININE 1.0 07/22/2016   CREATININE 1.23 03/20/2015 1038   CREATININE 1.06 12/05/2014 1536   CALCIUM 9.6 03/20/2015 1038   PROT 5.8 (L) 02/01/2015 0245   ALBUMIN 2.2 (L) 02/01/2015 0245   AST 14 06/08/2016   ALT 16 06/08/2016   ALKPHOS 24 (A) 06/08/2016   BILITOT 0.4 02/01/2015 0245   GFRNONAA 55 (L) 03/20/2015 1038   GFRAA >60 03/20/2015 1038   Recent Labs    06/08/16 07/22/16  NA 139 141  K 4.0 3.6  BUN 18 19  CREATININE 0.8 1.0   Recent Labs    06/08/16  AST 14  ALT 16  ALKPHOS 24*   Recent Labs    06/08/16  WBC 6.7  6.7  HGB 12.8*  12.8*  HCT 40*  40*  PLT 211  211   Recent Labs    06/09/16  CHOL 134  LDLCALC 42  TRIG 295*   Lab Results  Component Value Date   MICROALBUR >42.6 04/09/2016   Lab Results  Component Value Date   TSH 1.60 06/09/2016   Lab Results  Component Value Date   HGBA1C 9.5 11/21/2016   Lab Results  Component  Value Date   CHOL 134 06/09/2016   HDL 33 (A) 06/09/2016   LDLCALC 42 06/09/2016   LDLDIRECT 85.2 06/13/2012   TRIG 295 (A) 06/09/2016   CHOLHDL 7 06/13/2012    Significant Diagnostic Results in last 30 days:  No results found.  Assessment and Plan  Controlled diabetes mellitus type 2 with complications (HCC) Recent A1c was 9.5 which is unfortunately closer to his  baseline and then prior; patient is extremely noncompliant with his diet; will increase Toujeo to 45 units and Humalog 10 units with each meal; continue Glucophage 1000 mg twice a day; patient is on statin; starting lisinopril 2.5 mg daily  Diabetic peripheral neuropathy (HCC) Controlled; continue Neurontin 400 mg 3 times a day  Hyperlipidemia Well controlled; fasting lipid panel pending; continue Pravachol 40 mg by mouth daily    Elois Averitt D. [name], MD

## 2017-02-03 LAB — CUP PACEART REMOTE DEVICE CHECK
Battery Voltage: 3.01 V
Brady Statistic AP VP Percent: 29 %
Brady Statistic AS VP Percent: 71 %
Brady Statistic AS VS Percent: 1 %
Brady Statistic RA Percent Paced: 27 %
Brady Statistic RV Percent Paced: 99 %
Date Time Interrogation Session: 20190102070015
Implantable Lead Implant Date: 20170303
Implantable Lead Location: 753859
Implantable Pulse Generator Implant Date: 20170303
Lead Channel Impedance Value: 450 Ohm
Lead Channel Pacing Threshold Pulse Width: 0.5 ms
Lead Channel Pacing Threshold Pulse Width: 0.5 ms
Lead Channel Setting Pacing Amplitude: 0.875
Lead Channel Setting Pacing Pulse Width: 0.5 ms
Lead Channel Setting Sensing Sensitivity: 4 mV
MDC IDC LEAD IMPLANT DT: 20170303
MDC IDC LEAD LOCATION: 753860
MDC IDC MSMT BATTERY REMAINING LONGEVITY: 117 mo
MDC IDC MSMT BATTERY REMAINING PERCENTAGE: 95.5 %
MDC IDC MSMT LEADCHNL RA IMPEDANCE VALUE: 410 Ohm
MDC IDC MSMT LEADCHNL RA PACING THRESHOLD AMPLITUDE: 1.125 V
MDC IDC MSMT LEADCHNL RA SENSING INTR AMPL: 4.9 mV
MDC IDC MSMT LEADCHNL RV PACING THRESHOLD AMPLITUDE: 0.625 V
MDC IDC SET LEADCHNL RA PACING AMPLITUDE: 2.5 V
MDC IDC STAT BRADY AP VS PERCENT: 1 %
Pulse Gen Model: 2240
Pulse Gen Serial Number: 7888922

## 2017-02-04 ENCOUNTER — Encounter: Payer: Self-pay | Admitting: Internal Medicine

## 2017-02-04 DIAGNOSIS — I739 Peripheral vascular disease, unspecified: Secondary | ICD-10-CM | POA: Insufficient documentation

## 2017-02-04 NOTE — Assessment & Plan Note (Signed)
Some progression; plan to continue Sinemet IR 25-102 tabs by mouth 3 times a day

## 2017-02-04 NOTE — Assessment & Plan Note (Signed)
No wounds since July 2018; plan to continue ASA 81 mg by mouth daily, statin, and fenofibrate and fish oral, and diabetes mellitus  medications; risk as uncontrolled diabetes secondary to noncompliance

## 2017-02-04 NOTE — Assessment & Plan Note (Signed)
This recent A1c is 9.5 which is patient's usual baseline secondary noncompliance by both the patient and patient's family; patient continues on Glucophage 1000 mg by mouth twice a day, Toujeo 40 units subcutaneous daily and is now on sliding scale insulin with meals; will continue to try to work with patient compliance

## 2017-02-11 LAB — LIPID PANEL
Cholesterol: 154 (ref 0–200)
HDL: 36 (ref 35–70)
LDL CALC: 59
Triglycerides: 295 — AB (ref 40–160)

## 2017-02-11 LAB — BASIC METABOLIC PANEL
BUN: 35 — AB (ref 4–21)
Creatinine: 2.2 — AB (ref 0.6–1.3)
GLUCOSE: 183
Potassium: 4.3 (ref 3.4–5.3)
Sodium: 142 (ref 137–147)

## 2017-02-11 LAB — HEMOGLOBIN A1C: Hemoglobin A1C: 8

## 2017-02-11 LAB — CBC AND DIFFERENTIAL
HEMATOCRIT: 31 — AB (ref 41–53)
Hemoglobin: 11.2 — AB (ref 13.5–17.5)
Platelets: 243 (ref 150–399)
WBC: 6.3

## 2017-02-11 LAB — VITAMIN D 25 HYDROXY (VIT D DEFICIENCY, FRACTURES): VIT D 25 HYDROXY: 20.81

## 2017-02-11 LAB — HEPATIC FUNCTION PANEL
ALT: 6 — AB (ref 10–40)
AST: 17 (ref 14–40)
Alkaline Phosphatase: 21 — AB (ref 25–125)
BILIRUBIN, TOTAL: 0.3

## 2017-02-11 LAB — TSH: TSH: 1.89 (ref 0.41–5.90)

## 2017-02-12 ENCOUNTER — Encounter: Payer: Self-pay | Admitting: Internal Medicine

## 2017-02-12 NOTE — Assessment & Plan Note (Signed)
Well controlled; fasting lipid panel pending; continue Pravachol 40 mg by mouth daily

## 2017-02-12 NOTE — Assessment & Plan Note (Signed)
Controlled; continue Neurontin 400 mg 3 times a day

## 2017-02-12 NOTE — Assessment & Plan Note (Addendum)
Recent A1c was 9.5 which is unfortunately closer to his baseline and then prior; patient is extremely noncompliant with his diet; will increase Toujeo to 45 units and Humalog 10 units with each meal; continue Glucophage 1000 mg twice a day; patient is on statin; starting lisinopril 2.5 mg daily

## 2017-02-27 ENCOUNTER — Non-Acute Institutional Stay (SKILLED_NURSING_FACILITY): Payer: Medicare Other | Admitting: Internal Medicine

## 2017-02-27 DIAGNOSIS — I5022 Chronic systolic (congestive) heart failure: Secondary | ICD-10-CM | POA: Diagnosis not present

## 2017-02-27 DIAGNOSIS — I110 Hypertensive heart disease with heart failure: Secondary | ICD-10-CM | POA: Diagnosis not present

## 2017-02-27 DIAGNOSIS — I25708 Atherosclerosis of coronary artery bypass graft(s), unspecified, with other forms of angina pectoris: Secondary | ICD-10-CM

## 2017-02-28 ENCOUNTER — Encounter: Payer: Self-pay | Admitting: Internal Medicine

## 2017-02-28 NOTE — Progress Notes (Signed)
Location:  [facility] Room Number: [place] of Service:  SNF ([phone])  [name], MD  Patient Care Team: [name], MD as PCP - General (Internal Medicine) [name], MD as Consulting Physician (Cardiology) [name], MD as Consulting Physician (Neurology)  Extended Emergency Contact Information Primary Emergency Contact: [name],[name] Address: [address]          [place], [place] [address] [name] of [place] Phone: [phone] Mobile Phone: [phone] Relation: Spouse Secondary Emergency Contact: [name], [name] of [place] Phone: [phone] Mobile Phone: [phone] Relation: Daughter    Allergies: Benzedrine [amphetamine]; Dexedrine [dextroamphetamine sulfate er]; Oxycodone; and Vancomycin  Chief Complaint  Patient presents with  . Medical Management of Chronic Issues    Routine Visit    HPI: Patient is 77 y.o. male who is being seen for routine issues of hypertension, congestive heart failure, and coronary artery disease.   Past Medical History:  Diagnosis Date  . 2nd degree AV block    a. s/p STJ dual chamber pacemaker 11/2014  . Acute respiratory failure ([place]) 01/26/2015  . AKI (acute kidney injury) ([place]) 01/26/2015  . AMI, INFERIOR WALL 09/30/2008   Qualifier: Diagnosis of  By: Unk Lightning, RN, BSN, Melanie    . Benign neoplasm of colon     Adenomatous polyps  . Benign prostatic hyperplasia   . Bipolar affective disorder ([place])    . Bipolar depression ([place]) 05/10/2015  . BPH (benign prostatic hypertrophy)   . CAD (coronary artery disease)    a. s/p CABG  . CAD, ARTERY BYPASS GRAFT 11/18/2008   Qualifier: Diagnosis of  By: Angelena Form, MD, Harrell Gave    . Chronic pain    . CVA (cerebral infarction)     Small left occipital  . Dementia with behavioral disturbance 02/15/2015  . Depression    . Diabetic peripheral neuropathy ([place]) 09/28/2011  . DM       . DM (diabetes mellitus), type 2 with peripheral vascular complications ([place]) 9/67/8938   Qualifier: Diagnosis of  By: Burnett Kanaris    . Gout    . History of alcohol abuse    . Hyperlipidemia    . HYPERTENSION        . Hypertensive heart disease with heart failure ([place]) 09/01/2008   Qualifier: Diagnosis of  By: Burnett Kanaris    . Insomnia 04/19/2012  . Ischemic cardiomyopathy    . Narcolepsy    . Obesity   . OBESITY 09/01/2008   Qualifier: Diagnosis of  By: Burnett Kanaris    . OBSTRUCTIVE SLEEP APNEA        . Obstructive sleep apnea 09/16/2008   Qualifier: Diagnosis of  By: [name] MD, [name]   . Pacemaker 12/12/2014  . Parkinsonism ([place]) 09/30/2015  . [facility] spotted fever   . SINUS TACHYCARDIA 09/02/2008   Qualifier: Diagnosis of  By: Caryl Comes, MD, [name]   . TIA (transient ischemic attack)    . TOBACCO ABUSE 07/31/2007   Qualifier: Diagnosis of  By: [name] MD, [name]   . Tremor 08/11/2015  . UNIVERSAL ULCERATIVE COLITIS        . Vitamin D deficiency 01/07/2015    Past Surgical History:  Procedure Laterality Date  . CARDIAC CATHETERIZATION     Triple-vessel coronary artery disease. Low-normal left ventricular systolic function with mild   anteroapical wall motion abnormality.   [name] Kitchen  CARDIAC CATHETERIZATION N/A 01/28/2015   Procedure: Temporary Pacemaker;  Surgeon: [name], MD;  Location: [facility];  Service: Cardiovascular;  Laterality: N/A;  . CORONARY ARTERY BYPASS GRAFT     Median sternotomy, extracorporeal circulation,  coronary artery bypass graft surgery x4 using a sequential left internal   mammary artery graft to the mid and distal left anterior descending, a   saphenous vein graft to diagonal branch of the left anterior descending,   and a saphenous vein graft to the obtuse marginal branch of left   circumflex coronary artery.    . EP IMPLANTABLE DEVICE N/A 12/12/2014   Procedure: Pacemaker Implant;  Surgeon: [name],  MD;  Location: [facility];  Service: Cardiovascular;  Laterality: N/A;  . EP IMPLANTABLE DEVICE N/A 01/28/2015   Procedure: PPM Generator Removal;  Surgeon: [name], MD;  Location: [facility];  Service: Cardiovascular;  Laterality: N/A;  . EP IMPLANTABLE DEVICE N/A 03/20/2015   Procedure: Pacemaker Implant;  Surgeon: [name], MD;  Location: [facility];  Service: Cardiovascular;  Laterality: N/A;  . FLEXIBLE SIGMOIDOSCOPY  01/17/2012   Procedure: FLEXIBLE SIGMOIDOSCOPY;  Surgeon: Lafayette Dragon, MD;  Location: [facility];  Service: Endoscopy;  Laterality: N/A;  . MULTIPLE EXTRACTIONS WITH ALVEOLOPLASTY  12/01/2011   Procedure: MULTIPLE EXTRACION WITH ALVEOLOPLASTY;  Surgeon: Lenn Cal, DDS;  Location: [facility];  Service: Oral Surgery;  Laterality: N/A;  Extraction of tooth #'s 3,4,5,6,7,8,9,10,11,12,13,14,15,16,17,20,21,22,23,24,25,26,27,28,29,30,31,32 with alveoloplasty and bilateral mandibular lingual tori  . TONSILECTOMY, ADENOIDECTOMY, BILATERAL MYRINGOTOMY AND TUBES  1948    Allergies as of 02/27/2017      Reactions   Benzedrine [amphetamine] Nausea Only   Dexedrine [dextroamphetamine Sulfate Er] Other (See Comments)   agitation   Oxycodone Nausea And Vomiting   Vancomycin    Unknown - per [facility]      Medication List        Accurate as of 02/27/17 11:59 PM. Always use your most recent med list.          aspirin 81 MG chewable tablet Chew 81 mg by mouth at bedtime.   carbidopa-levodopa 25-100 MG tablet Commonly known as:  SINEMET IR Take 2 tablets by mouth 3 (three) times daily with meals.   carvedilol 3.125 MG tablet Commonly known as:  COREG Take 3.125 mg by mouth 2 (two) times daily with a meal. Hold for SBP less than 105   clonazePAM 0.5 MG tablet Commonly known as:  KLONOPIN Take 0.5 mg by mouth at bedtime.   docusate sodium 100 MG capsule Commonly known as:  COLACE Take 100 mg by mouth daily. Hold for > 2 loose stools in a  day   donepezil 10 MG tablet Commonly known as:  ARICEPT Take 10 mg by mouth at bedtime.   doxazosin 1 MG tablet Commonly known as:  CARDURA Take 1 mg by mouth at bedtime.   fenofibrate 145 MG tablet Commonly known as:  TRICOR Take 145 mg by mouth daily. With a meal   Fish Oil 1000 MG Caps Take 1 capsule by mouth 2 (two) times daily.   furosemide 40 MG tablet Commonly known as:  LASIX Take 40 mg by mouth daily.   gabapentin 400 MG capsule Commonly known as:  NEURONTIN 400 mg 3 (three) times daily.   HUMALOG KWIKPEN 100 UNIT/ML KiwkPen Generic drug:  insulin lispro Inject 8 Units 3 (three) times daily into the skin. Hold for CBG  <  200. ( to be given in addition to sliding scale) Check FSBS before meals and at bedtime. SSI 201-25 4 units, 251-300 6 units, 301-350 8 units, 351-400 10 units   lamoTRIgine 25 MG tablet Commonly known as:  LAMICTAL Take 25 mg by mouth daily. give one tab with Lamictal 100 mg to equal 125 mg daily   lamoTRIgine 100 MG tablet Commonly known as:  LAMICTAL Take 100 mg by mouth daily. Give 100 mg tablet along with 25 mg tablet to equal 125 mg total   magnesium oxide 400 MG tablet Commonly known as:  MAG-OX Take 400 mg by mouth daily.   metFORMIN 1000 MG tablet Commonly known as:  GLUCOPHAGE Take 1,000 mg by mouth 2 (two) times daily.   multivitamin tablet Take 1 tablet by mouth daily.   nitroGLYCERIN 0.4 MG SL tablet Commonly known as:  NITROSTAT Place 0.4 mg under the tongue every 5 (five) minutes as needed for chest pain.   OXYGEN Inhale 2 L into the lungs continuous. To keep spo2 above 90%   polyethylene glycol packet Commonly known as:  MIRALAX / GLYCOLAX Take 17 g by mouth daily.   pravastatin 40 MG tablet Commonly known as:  PRAVACHOL Take 40 mg by mouth at bedtime.   TOUJEO MAX SOLOSTAR 300 UNIT/ML Sopn Generic drug:  Insulin Glargine Inject 40 Units into the skin daily.   traMADol 50 MG tablet Commonly known as:   ULTRAM Take 50 mg by mouth as directed. Every shift for pain   traZODone 50 MG tablet Commonly known as:  DESYREL Take 50 mg by mouth at bedtime.   Vitamin D3 50000 units Caps Take 1 capsule by mouth once a week.       No orders of the defined types were placed in this encounter.   Immunization History  Administered Date(s) Administered  . Influenza-Unspecified 10/28/2016  . PPD Test 03/20/2013    Social History   Tobacco Use  . Smoking status: Former Smoker    Packs/day: 2.00    Years: 50.00    Pack years: 100.00    Last attempt to quit: 08/18/2014    Years since quitting: 2.5  . Smokeless tobacco: Former Network engineer Use Topics  . Alcohol use: No    Comment: quit 2003    Review of Systems  DATA OBTAINED: from patient-limited; nursing-no acute concerns GENERAL:  no fevers, fatigue, appetite changes SKIN: No itching, rash HEENT: No complaint RESPIRATORY: No cough, wheezing, SOB CARDIAC: No chest pain, palpitations, lower extremity edema  GI: No abdominal pain, No N/V/D or constipation, No heartburn or reflux  GU: No dysuria, frequency or urgency, or incontinence  MUSCULOSKELETAL: No unrelieved bone/joint pain NEUROLOGIC: No headache, dizziness  PSYCHIATRIC: No overt anxiety or sadness  Vitals:   02/27/17 0838  BP: 126/74  Pulse: 72  Resp: 18  Temp: 98.7 F (37.1 C)  SpO2: 94%   Body mass index is 40.27 kg/m. Physical Exam  GENERAL APPEARANCE: Alert, moderately conversant, No acute distress  SKIN: No diaphoresis rash HEENT: Unremarkable RESPIRATORY: Breathing is even, unlabored. Lung sounds are clear   CARDIOVASCULAR: Heart RRR no murmurs, rubs or gallops. Trace peripheral edema  GASTROINTESTINAL: Abdomen is soft, non-tender, not distended w/ normal bowel sounds.  GENITOURINARY: Bladder non tender, not distended  MUSCULOSKELETAL: No abnormal joints or musculature; stiffness NEUROLOGIC: Cranial nerves 2-12 grossly intact. Moves all extremities  upper extremity tremor PSYCHIATRIC: Mood and affect dementia no behavioral issues  Patient Active Problem List  Diagnosis Date Noted  . Peripheral vascular disease ([place]) 02/04/2017  . Controlled diabetes mellitus type 2 with complications ([place]) 07/13/9483  . Aspiration pneumonia ([place]) 09/30/2015  . Parkinsonism ([place]) 09/30/2015  . Tremor 08/11/2015  . Suicidal ideation 05/10/2015  . Bipolar depression ([place]) 05/10/2015  . Complete heart block ([place]) 03/20/2015  . Bradycardia 03/14/2015  . Dementia with behavioral disturbance 02/15/2015  . Staphylococcus aureus bacteremia with sepsis ([place]) 01/28/2015  . Infection of pacemaker pocket ([place]) 01/28/2015  . Acute respiratory failure ([place]) 01/26/2015  . AKI (acute kidney injury) ([place]) 01/26/2015  . Acute encephalopathy 01/26/2015  . Altered mental status   . Vitamin D deficiency 01/07/2015  . Mobitz type 2 second degree atrioventricular block 12/12/2014  . Pacemaker 12/12/2014  . Symptomatic bradycardia 12/03/2014  . Lower back pain 06/12/2012  . Right hip pain 06/12/2012  . Intertrigo 05/09/2012  . Insomnia 04/19/2012  . General weakness 03/07/2012  . 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 Diarrhea 01/17/2012  . Pyelonephritis 01/15/2012  . Diabetic peripheral neuropathy ([place]) 09/28/2011  . Urinary incontinence 09/28/2011  . Preventative health care 09/23/2011  . Gout 09/23/2011  . Bipolar affective disorder ([place]) 09/23/2011  . TIA (transient ischemic attack) 09/23/2011  . Chronic pain 09/23/2011  . DNR (do not resuscitate) 09/23/2011  . CVA (cerebral infarction) 09/23/2011  . 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 History of alcohol abuse 09/23/2011  . Benign prostatic hyperplasia   . Left ventricular systolic dysfunction   . Chronic systolic CHF (congestive heart failure) ([place]) 05/05/2011  . Edema 01/03/2011  . CAD (coronary artery disease) 05/18/2010  . Hyperlipidemia 05/18/2010  . Other abnormality of  urination(788.69) 11/25/2009  . Other specified forms of chronic ischemic heart disease 05/19/2009  . CAD, ARTERY BYPASS GRAFT 11/18/2008  . AMI, INFERIOR WALL 09/30/2008  . Obstructive sleep apnea 09/16/2008  . SINUS TACHYCARDIA 09/02/2008  . DM (diabetes mellitus), type 2 with peripheral vascular complications ([place]) 46/27/0350  . OBESITY 09/01/2008  . Hypertensive heart disease with heart failure ([place]) 09/01/2008  . TOBACCO ABUSE 07/31/2007  . 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    CMP     Component Value Date/Time   NA 142 02/11/2017   K 4.3 02/11/2017   CL 102 03/20/2015 1038   CO2 28 03/20/2015 1038   GLUCOSE 171 (H) 03/20/2015 1038   BUN 35 (A) 02/11/2017   CREATININE 2.2 (A) 02/11/2017   CREATININE 1.23 03/20/2015 1038   CREATININE 1.06 12/05/2014 1536   CALCIUM 9.6 03/20/2015 1038   PROT 5.8 (L) 02/01/2015 0245   ALBUMIN 2.2 (L) 02/01/2015 0245   AST 17 02/11/2017   ALT 6 (A) 02/11/2017   ALKPHOS 21 (A) 02/11/2017   BILITOT 0.4 02/01/2015 0245   GFRNONAA 55 (L) 03/20/2015 1038   GFRAA >60 03/20/2015 1038   Recent Labs    06/08/16 07/22/16 02/11/17  NA 139 141 142  K 4.0 3.6 4.3  BUN 18 19 35*  CREATININE 0.8 1.0 2.2*   Recent Labs    06/08/16 02/11/17  AST 14 17  ALT 16 6*  ALKPHOS 24* 21*   Recent Labs    06/08/16 02/11/17  WBC 6.7  6.7 6.3  HGB 12.8*  12.8* 11.2*  HCT 40*  40* 31*  PLT 211  211 243   Recent Labs    06/09/16 02/11/17  CHOL 134 154  LDLCALC 42 59  TRIG 295* 295*   Lab Results  Component Value Date   MICROALBUR >42.6 04/09/2016   Lab Results  Component Value Date   TSH 1.89 02/11/2017   Lab Results  Component Value Date   HGBA1C 8 02/11/2017   Lab Results  Component Value Date   CHOL 154 02/11/2017   HDL 36 02/11/2017   LDLCALC 59 02/11/2017   LDLDIRECT 85.2 06/13/2012   TRIG 295 (A) 02/11/2017   CHOLHDL 7 06/13/2012    Significant Diagnostic Results in last 30 days:  No results  found.  Assessment and Plan  Hypertensive heart disease with heart failure (HCC) Stable; continue Cardura 1 mg by mouth daily at bedtime, Coreg 3.125 mg by mouth twice a day and Lasix 40 mg daily  Chronic systolic CHF (congestive heart failure) (HCC) No exacerbation; continue Coreg 3.125 mg. Twice a day and Lasix 40 mg daily  CAD (coronary artery disease) No chest pain reported; continue ASA 81 mg daily Coreg 3.125 mg twice a day and nitroglycerin sublingual when necessary; patient is on statin     Webb Silversmith D. [name], MD

## 2017-03-04 ENCOUNTER — Encounter: Payer: Self-pay | Admitting: Internal Medicine

## 2017-03-04 NOTE — Assessment & Plan Note (Signed)
No exacerbation; continue Coreg 3.125 mg. Twice a day and Lasix 40 mg daily

## 2017-03-04 NOTE — Assessment & Plan Note (Signed)
Stable; continue Cardura 1 mg by mouth daily at bedtime, Coreg 3.125 mg by mouth twice a day and Lasix 40 mg daily

## 2017-03-04 NOTE — Assessment & Plan Note (Signed)
No chest pain reported; continue ASA 81 mg daily Coreg 3.125 mg twice a day and nitroglycerin sublingual when necessary; patient is on statin

## 2017-03-24 ENCOUNTER — Non-Acute Institutional Stay (SKILLED_NURSING_FACILITY): Payer: Medicare Other | Admitting: Internal Medicine

## 2017-03-24 DIAGNOSIS — F319 Bipolar disorder, unspecified: Secondary | ICD-10-CM

## 2017-03-24 DIAGNOSIS — F3175 Bipolar disorder, in partial remission, most recent episode depressed: Secondary | ICD-10-CM | POA: Diagnosis not present

## 2017-03-24 DIAGNOSIS — N400 Enlarged prostate without lower urinary tract symptoms: Secondary | ICD-10-CM

## 2017-03-24 DIAGNOSIS — F3130 Bipolar disorder, current episode depressed, mild or moderate severity, unspecified: Secondary | ICD-10-CM

## 2017-03-26 ENCOUNTER — Encounter: Payer: Self-pay | Admitting: Internal Medicine

## 2017-03-26 NOTE — Assessment & Plan Note (Signed)
No reports of mood instability; continue Lamictal 125 mg daily

## 2017-03-26 NOTE — Assessment & Plan Note (Signed)
Appears controlled; continue trazodone 50 mg daily at bedtime

## 2017-03-26 NOTE — Progress Notes (Signed)
Location:  [facility] of Service:  SNF (31)  [name], MD  Patient Care Team: [name], MD as PCP - General (Internal Medicine) [name], MD as Consulting Physician (Cardiology) [name], MD as Consulting Physician (Neurology)  Extended Emergency Contact Information Primary Emergency Contact: [name],[name] Address: [address]          [place], [place] [address] [name] of [place] Phone: [phone] Mobile Phone: [phone] Relation: Spouse Secondary Emergency Contact: [name], [name] of [place] Phone: [phone] Mobile Phone: [phone] Relation: Daughter    Allergies: Benzedrine [amphetamine]; Dexedrine [dextroamphetamine sulfate er]; Oxycodone; and Vancomycin  Chief Complaint  Patient presents with  . Medical Management of Chronic Issues    HPI: Patient is 77 y.o. male who is being seen for routine issues of BPH, bipolar 1 disorder, and depression secondary to bipolar disorder.  Past Medical History:  Diagnosis Date  . 2nd degree AV block    a. s/p STJ dual chamber pacemaker 11/2014  . Acute respiratory failure ([place]) 01/26/2015  . AKI (acute kidney injury) ([place]) 01/26/2015  . AMI, INFERIOR WALL 09/30/2008   Qualifier: Diagnosis of  By: Unk Lightning, RN, BSN, Melanie    . Benign neoplasm of colon     Adenomatous polyps  . Benign prostatic hyperplasia   . Bipolar affective disorder ([place])    . Bipolar depression ([place]) 05/10/2015  . BPH (benign prostatic hypertrophy)   . CAD (coronary artery disease)    a. s/p CABG  . CAD, ARTERY BYPASS GRAFT 11/18/2008   Qualifier: Diagnosis of  By: Angelena Form, MD, Harrell Gave    . Chronic pain    . CVA (cerebral infarction)     Small left occipital  . Dementia with behavioral disturbance 02/15/2015  . Depression    . Diabetic peripheral neuropathy ([place]) 09/28/2011  . DM        . DM (diabetes mellitus), type 2 with  peripheral vascular complications ([place]) 0/09/3816   Qualifier: Diagnosis of  By: Burnett Kanaris    . Gout    . History of alcohol abuse    . Hyperlipidemia    . HYPERTENSION        . Hypertensive heart disease with heart failure ([place]) 09/01/2008   Qualifier: Diagnosis of  By: Burnett Kanaris    . Insomnia 04/19/2012  . Ischemic cardiomyopathy    . Narcolepsy    . Obesity   . OBESITY 09/01/2008   Qualifier: Diagnosis of  By: Burnett Kanaris    . OBSTRUCTIVE SLEEP APNEA        . Obstructive sleep apnea 09/16/2008   Qualifier: Diagnosis of  By: [name] MD, [name]   . Pacemaker 12/12/2014  . Parkinsonism ([place]) 09/30/2015  . [facility] spotted fever   . SINUS TACHYCARDIA 09/02/2008   Qualifier: Diagnosis of  By: Caryl Comes, MD, [name]   . TIA (transient ischemic attack)    . TOBACCO ABUSE 07/31/2007   Qualifier: Diagnosis of  By: [name] MD, [name]   . Tremor 08/11/2015  . UNIVERSAL ULCERATIVE COLITIS        . Vitamin D deficiency 01/07/2015    Past Surgical History:  Procedure Laterality Date  . CARDIAC CATHETERIZATION     Triple-vessel coronary artery disease. Low-normal left ventricular systolic function with mild   anteroapical wall motion abnormality.   [name] Kitchen CARDIAC CATHETERIZATION N/A 01/28/2015  Procedure: Temporary Pacemaker;  Surgeon: [name], MD;  Location: [facility];  Service: Cardiovascular;  Laterality: N/A;  . CORONARY ARTERY BYPASS GRAFT     Median sternotomy, extracorporeal circulation,  coronary artery bypass graft surgery x4 using a sequential left internal   mammary artery graft to the mid and distal left anterior descending, a   saphenous vein graft to diagonal branch of the left anterior descending,   and a saphenous vein graft to the obtuse marginal branch of left   circumflex coronary artery.    . EP IMPLANTABLE DEVICE N/A 12/12/2014   Procedure: Pacemaker Implant;  Surgeon: [name], MD;  Location: [facility];   Service: Cardiovascular;  Laterality: N/A;  . EP IMPLANTABLE DEVICE N/A 01/28/2015   Procedure: PPM Generator Removal;  Surgeon: [name], MD;  Location: [facility];  Service: Cardiovascular;  Laterality: N/A;  . EP IMPLANTABLE DEVICE N/A 03/20/2015   Procedure: Pacemaker Implant;  Surgeon: [name], MD;  Location: [facility];  Service: Cardiovascular;  Laterality: N/A;  . FLEXIBLE SIGMOIDOSCOPY  01/17/2012   Procedure: FLEXIBLE SIGMOIDOSCOPY;  Surgeon: Lafayette Dragon, MD;  Location: [facility];  Service: Endoscopy;  Laterality: N/A;  . MULTIPLE EXTRACTIONS WITH ALVEOLOPLASTY  12/01/2011   Procedure: MULTIPLE EXTRACION WITH ALVEOLOPLASTY;  Surgeon: Lenn Cal, DDS;  Location: [facility];  Service: Oral Surgery;  Laterality: N/A;  Extraction of tooth #'s 3,4,5,6,7,8,9,10,11,12,13,14,15,16,17,20,21,22,23,24,25,26,27,28,29,30,31,32 with alveoloplasty and bilateral mandibular lingual tori  . TONSILECTOMY, ADENOIDECTOMY, BILATERAL MYRINGOTOMY AND TUBES  1948    Allergies as of 03/24/2017      Reactions   Benzedrine [amphetamine] Nausea Only   Dexedrine [dextroamphetamine Sulfate Er] Other (See Comments)   agitation   Oxycodone Nausea And Vomiting   Vancomycin    Unknown - per [facility]      Medication List        Accurate as of 03/24/17 11:59 PM. Always use your most recent med list.          aspirin 81 MG chewable tablet Chew 81 mg by mouth at bedtime.   carbidopa-levodopa 25-100 MG tablet Commonly known as:  SINEMET IR Take 2 tablets by mouth 3 (three) times daily with meals.   carvedilol 3.125 MG tablet Commonly known as:  COREG Take 3.125 mg by mouth 2 (two) times daily with a meal. Hold for SBP less than 105   clonazePAM 0.5 MG tablet Commonly known as:  KLONOPIN Take 0.5 mg by mouth at bedtime.   docusate sodium 100 MG capsule Commonly known as:  COLACE Take 100 mg by mouth daily. Hold for > 2 loose stools in a day   donepezil 10 MG tablet Commonly  known as:  ARICEPT Take 10 mg by mouth at bedtime.   doxazosin 1 MG tablet Commonly known as:  CARDURA Take 1 mg by mouth at bedtime.   fenofibrate 145 MG tablet Commonly known as:  TRICOR Take 145 mg by mouth daily. With a meal   Fish Oil 1000 MG Caps Take 1 capsule by mouth 2 (two) times daily.   furosemide 40 MG tablet Commonly known as:  LASIX Take 40 mg by mouth daily.   gabapentin 400 MG capsule Commonly known as:  NEURONTIN 400 mg 3 (three) times daily.   HUMALOG KWIKPEN 100 UNIT/ML KiwkPen Generic drug:  insulin lispro Inject 8 Units 3 (three) times daily into the skin. Hold for CBG  < 200. ( to be given  in addition to sliding scale) Check FSBS before meals and at bedtime. SSI 201-25 4 units, 251-300 6 units, 301-350 8 units, 351-400 10 units   lamoTRIgine 25 MG tablet Commonly known as:  LAMICTAL Take 25 mg by mouth daily. give one tab with Lamictal 100 mg to equal 125 mg daily   lamoTRIgine 100 MG tablet Commonly known as:  LAMICTAL Take 100 mg by mouth daily. Give 100 mg tablet along with 25 mg tablet to equal 125 mg total   magnesium oxide 400 MG tablet Commonly known as:  MAG-OX Take 400 mg by mouth daily.   metFORMIN 1000 MG tablet Commonly known as:  GLUCOPHAGE Take 1,000 mg by mouth 2 (two) times daily.   multivitamin tablet Take 1 tablet by mouth daily.   nitroGLYCERIN 0.4 MG SL tablet Commonly known as:  NITROSTAT Place 0.4 mg under the tongue every 5 (five) minutes as needed for chest pain.   OXYGEN Inhale 2 L into the lungs continuous. To keep spo2 above 90%   polyethylene glycol packet Commonly known as:  MIRALAX / GLYCOLAX Take 17 g by mouth daily.   pravastatin 40 MG tablet Commonly known as:  PRAVACHOL Take 40 mg by mouth at bedtime.   TOUJEO MAX SOLOSTAR 300 UNIT/ML Sopn Generic drug:  Insulin Glargine Inject 40 Units into the skin daily.   traMADol 50 MG tablet Commonly known as:  ULTRAM Take 50 mg by mouth as directed.  Every shift for pain   traZODone 50 MG tablet Commonly known as:  DESYREL Take 50 mg by mouth at bedtime.   Vitamin D3 50000 units Caps Take 1 capsule by mouth once a week.       No orders of the defined types were placed in this encounter.   Immunization History  Administered Date(s) Administered  . Influenza-Unspecified 10/28/2016  . PPD Test 03/20/2013    Social History   Tobacco Use  . Smoking status: Former Smoker    Packs/day: 2.00    Years: 50.00    Pack years: 100.00    Last attempt to quit: 08/18/2014    Years since quitting: 2.6  . Smokeless tobacco: Former Network engineer Use Topics  . Alcohol use: No    Comment: quit 2003    Review of Systems  DATA OBTAINED: from patient-limited; nursing-no acute concerns GENERAL:  no fevers, fatigue, appetite changes SKIN: No itching, rash HEENT: No complaint RESPIRATORY: No cough, wheezing, SOB CARDIAC: No chest pain, palpitations, lower extremity edema  GI: No abdominal pain, No N/V/D or constipation, No heartburn or reflux  GU: No dysuria, frequency or urgency, or incontinence  MUSCULOSKELETAL: No unrelieved bone/joint pain NEUROLOGIC: No headache, dizziness  PSYCHIATRIC: No overt anxiety or sadness  Vitals:   03/26/17 2014  BP: (!) 147/78  Pulse: 73  Resp: 20  Temp: 98.7 F (37.1 C)  SpO2: 92%   There is no height or weight on file to calculate BMI. Physical Exam  GENERAL APPEARANCE: Alert, moderately conversant, No acute distress ; obese white male SKIN: No diaphoresis rash HEENT: Unremarkable RESPIRATORY: Breathing is even, unlabored. Lung sounds are clear   CARDIOVASCULAR: Heart RRR no murmurs, rubs or gallops. Race peripheral edema  GASTROINTESTINAL: Abdomen is soft, non-tender, not distended w/ normal bowel sounds.  GENITOURINARY: Bladder non tender, not distended  MUSCULOSKELETAL: No abnormal joints or musculature NEUROLOGIC: Cranial nerves 2-12 grossly intact. Moves all  extremities PSYCHIATRIC: Mood and affect with dementia, no behavioral issues  Patient Active Problem List  Diagnosis Date Noted  . Peripheral vascular disease ([place]) 02/04/2017  . Controlled diabetes mellitus type 2 with complications ([place]) 09/60/4540  . Aspiration pneumonia ([place]) 09/30/2015  . Parkinsonism ([place]) 09/30/2015  . Tremor 08/11/2015  . Suicidal ideation 05/10/2015  . Bipolar depression ([place]) 05/10/2015  . Complete heart block ([place]) 03/20/2015  . Bradycardia 03/14/2015  . Dementia with behavioral disturbance 02/15/2015  . Staphylococcus aureus bacteremia with sepsis ([place]) 01/28/2015  . Infection of pacemaker pocket ([place]) 01/28/2015  . Acute respiratory failure ([place]) 01/26/2015  . AKI (acute kidney injury) ([place]) 01/26/2015  . Acute encephalopathy 01/26/2015  . Altered mental status   . Vitamin D deficiency 01/07/2015  . Mobitz type 2 second degree atrioventricular block 12/12/2014  . Pacemaker 12/12/2014  . Symptomatic bradycardia 12/03/2014  . Lower back pain 06/12/2012  . Right hip pain 06/12/2012  . Intertrigo 05/09/2012  . Insomnia 04/19/2012  . General weakness 03/07/2012  . 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 Diarrhea 01/17/2012  . Pyelonephritis 01/15/2012  . Diabetic peripheral neuropathy ([place]) 09/28/2011  . Urinary incontinence 09/28/2011  . Preventative health care 09/23/2011  . Gout 09/23/2011  . Bipolar affective disorder ([place]) 09/23/2011  . TIA (transient ischemic attack) 09/23/2011  . Chronic pain 09/23/2011  . DNR (do not resuscitate) 09/23/2011  . CVA (cerebral infarction) 09/23/2011  . 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 History of alcohol abuse 09/23/2011  . Benign prostatic hyperplasia   . Left ventricular systolic dysfunction   . Chronic systolic CHF (congestive heart failure) ([place]) 05/05/2011  . Edema 01/03/2011  . CAD (coronary artery disease) 05/18/2010  . Hyperlipidemia 05/18/2010  . Other abnormality of urination(788.69)  11/25/2009  . Other specified forms of chronic ischemic heart disease 05/19/2009  . CAD, ARTERY BYPASS GRAFT 11/18/2008  . AMI, INFERIOR WALL 09/30/2008  . Obstructive sleep apnea 09/16/2008  . SINUS TACHYCARDIA 09/02/2008  . DM (diabetes mellitus), type 2 with peripheral vascular complications ([place]) 98/11/9145  . OBESITY 09/01/2008  . Hypertensive heart disease with heart failure ([place]) 09/01/2008  . TOBACCO ABUSE 07/31/2007  . 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    CMP     Component Value Date/Time   NA 142 02/11/2017   K 4.3 02/11/2017   CL 102 03/20/2015 1038   CO2 28 03/20/2015 1038   GLUCOSE 171 (H) 03/20/2015 1038   BUN 35 (A) 02/11/2017   CREATININE 2.2 (A) 02/11/2017   CREATININE 1.23 03/20/2015 1038   CREATININE 1.06 12/05/2014 1536   CALCIUM 9.6 03/20/2015 1038   PROT 5.8 (L) 02/01/2015 0245   ALBUMIN 2.2 (L) 02/01/2015 0245   AST 17 02/11/2017   ALT 6 (A) 02/11/2017   ALKPHOS 21 (A) 02/11/2017   BILITOT 0.4 02/01/2015 0245   GFRNONAA 55 (L) 03/20/2015 1038   GFRAA >60 03/20/2015 1038   Recent Labs    06/08/16 07/22/16 02/11/17  NA 139 141 142  K 4.0 3.6 4.3  BUN 18 19 35*  CREATININE 0.8 1.0 2.2*   Recent Labs    06/08/16 02/11/17  AST 14 17  ALT 16 6*  ALKPHOS 24* 21*   Recent Labs    06/08/16 02/11/17  WBC 6.7  6.7 6.3  HGB 12.8*  12.8* 11.2*  HCT 40*  40* 31*  PLT 211  211 243   Recent Labs    06/09/16 02/11/17  CHOL 134 154  LDLCALC 42 59  TRIG 295* 295*   Lab Results  Component Value Date   MICROALBUR >42.6 04/09/2016   Lab Results  Component Value Date   TSH 1.89 02/11/2017   Lab Results  Component Value Date   HGBA1C 8 02/11/2017   Lab Results  Component Value Date   CHOL 154 02/11/2017   HDL 36 02/11/2017   LDLCALC 59 02/11/2017   LDLDIRECT 85.2 06/13/2012   TRIG 295 (A) 02/11/2017   CHOLHDL 7 06/13/2012    Significant Diagnostic Results in last 30 days:  No results found.  Assessment and  Plan  Benign prostatic hyperplasia Reports a problem; continue Cardura 1 mg by mouth daily  Bipolar affective disorder (HCC) No reports of mood instability; continue Lamictal 125 mg daily  Bipolar depression ([place]) Appears controlled; continue trazodone 50 mg daily at bedtime     Inocencio Homes, MD

## 2017-03-26 NOTE — Assessment & Plan Note (Signed)
Reports a problem; continue Cardura 1 mg by mouth daily

## 2017-04-19 ENCOUNTER — Telehealth: Payer: Self-pay | Admitting: Cardiology

## 2017-04-19 ENCOUNTER — Ambulatory Visit (INDEPENDENT_AMBULATORY_CARE_PROVIDER_SITE_OTHER): Payer: Medicare Other | Admitting: *Deleted

## 2017-04-19 DIAGNOSIS — I442 Atrioventricular block, complete: Secondary | ICD-10-CM | POA: Diagnosis not present

## 2017-04-19 NOTE — Telephone Encounter (Signed)
Confirmed remote transmission w/ pt wife.   

## 2017-04-19 NOTE — Progress Notes (Signed)
Remote pacemaker transmission.   

## 2017-04-20 ENCOUNTER — Encounter: Payer: Self-pay | Admitting: Cardiology

## 2017-05-11 LAB — CUP PACEART REMOTE DEVICE CHECK
Battery Voltage: 3.01 V
Brady Statistic AP VP Percent: 31 %
Brady Statistic AS VP Percent: 69 %
Brady Statistic AS VS Percent: 1 %
Brady Statistic RV Percent Paced: 99 %
Date Time Interrogation Session: 20190403140027
Implantable Lead Implant Date: 20170303
Implantable Lead Location: 753859
Implantable Pulse Generator Implant Date: 20170303
Lead Channel Impedance Value: 460 Ohm
Lead Channel Pacing Threshold Amplitude: 0.625 V
Lead Channel Pacing Threshold Pulse Width: 0.5 ms
Lead Channel Sensing Intrinsic Amplitude: 5 mV
Lead Channel Setting Pacing Amplitude: 0.875
Lead Channel Setting Pacing Amplitude: 2.5 V
Lead Channel Setting Pacing Pulse Width: 0.5 ms
Lead Channel Setting Sensing Sensitivity: 4 mV
MDC IDC LEAD IMPLANT DT: 20170303
MDC IDC LEAD LOCATION: 753860
MDC IDC MSMT BATTERY REMAINING LONGEVITY: 118 mo
MDC IDC MSMT BATTERY REMAINING PERCENTAGE: 95.5 %
MDC IDC MSMT LEADCHNL RA IMPEDANCE VALUE: 440 Ohm
MDC IDC MSMT LEADCHNL RA PACING THRESHOLD AMPLITUDE: 1.125 V
MDC IDC MSMT LEADCHNL RA PACING THRESHOLD PULSEWIDTH: 0.5 ms
MDC IDC STAT BRADY AP VS PERCENT: 1 %
MDC IDC STAT BRADY RA PERCENT PACED: 29 %
Pulse Gen Model: 2240
Pulse Gen Serial Number: 7888922

## 2017-05-15 ENCOUNTER — Encounter: Payer: Self-pay | Admitting: Internal Medicine

## 2017-05-15 ENCOUNTER — Non-Acute Institutional Stay (SKILLED_NURSING_FACILITY): Payer: Medicare Other | Admitting: Internal Medicine

## 2017-05-15 DIAGNOSIS — R251 Tremor, unspecified: Secondary | ICD-10-CM

## 2017-05-15 DIAGNOSIS — G218 Other secondary parkinsonism: Secondary | ICD-10-CM | POA: Diagnosis not present

## 2017-05-15 DIAGNOSIS — E559 Vitamin D deficiency, unspecified: Secondary | ICD-10-CM | POA: Diagnosis not present

## 2017-05-15 NOTE — Progress Notes (Signed)
Location:  [facility] Room Number: [id number] Place of Service:  SNF (31)  Victor Duos, MD   provider  Patient Care Team: Victor Duos, MD as PCP - General (Internal Medicine) [name], MD as Consulting Physician (Cardiology) [name], MD as Consulting Physician (Neurology)  Extended Emergency Contact Information Primary Emergency Contact: [name],[name] Address: [address]          [place], [place] [address] [name] of [place] Phone: [phone] Mobile Phone: [phone] Relation: Spouse Secondary Emergency Contact: [name], [name] of [place] Phone: [phone] Mobile Phone: [phone] Relation: Daughter    Allergies: Benzedrine [amphetamine]; Dexedrine [dextroamphetamine sulfate er]; Oxycodone; and Vancomycin  Chief Complaint  Patient presents with  . Medical Management of Chronic Issues    Routine Visit    HPI: Patient is 77 y.o. male who is being seen for routine issues of vitamin D deficiency tremor and Parkinson's disease.  Past Medical History:  Diagnosis Date  . 2nd degree AV block    a. s/p STJ dual chamber pacemaker 11/2014  . Acute respiratory failure ([facility]) 01/26/2015  . AKI (acute kidney injury) ([facility]) 01/26/2015  . AMI, INFERIOR WALL 09/30/2008   Qualifier: Diagnosis of  By: Unk Lightning, RN, BSN, Melanie    . Benign neoplasm of colon     Adenomatous polyps  . Benign prostatic hyperplasia   . Bipolar affective disorder ([place])    . Bipolar depression ([place]) 05/10/2015  . BPH (benign prostatic hypertrophy)   . CAD (coronary artery disease)    a. s/p CABG  . CAD, ARTERY BYPASS GRAFT 11/18/2008   Qualifier: Diagnosis of  By: Angelena Form, MD, Harrell Gave    . Chronic pain    . CVA (cerebral infarction)     Small left occipital  . Dementia with behavioral disturbance 02/15/2015  . Depression    . Diabetic peripheral neuropathy ([place]) 09/28/2011  . DM        .  DM (diabetes mellitus), type 2 with peripheral vascular complications ([place]) 01/18/5850   Qualifier: Diagnosis of  By: Burnett Kanaris    . Gout    . History of alcohol abuse    . Hyperlipidemia    . HYPERTENSION        . Hypertensive heart disease with heart failure ([place]) 09/01/2008   Qualifier: Diagnosis of  By: Burnett Kanaris    . Insomnia 04/19/2012  . Ischemic cardiomyopathy    . Narcolepsy    . Obesity   . OBESITY 09/01/2008   Qualifier: Diagnosis of  By: Burnett Kanaris    . OBSTRUCTIVE SLEEP APNEA        . Obstructive sleep apnea 09/16/2008   Qualifier: Diagnosis of  By: [name] MD, [name]   . Pacemaker 12/12/2014  . Parkinsonism ([place]) 09/30/2015  . [facility] spotted fever   . SINUS TACHYCARDIA 09/02/2008   Qualifier: Diagnosis of  By: Caryl Comes, MD, [name]   . TIA (transient ischemic attack)    . TOBACCO ABUSE 07/31/2007   Qualifier: Diagnosis of  By: [name] MD, [name]   . Tremor 08/11/2015  . UNIVERSAL ULCERATIVE COLITIS        . Vitamin D deficiency 01/07/2015    Past Surgical History:  Procedure Laterality Date  . CARDIAC CATHETERIZATION     Triple-vessel coronary artery disease. Low-normal left ventricular systolic function with mild   anteroapical wall motion abnormality.   [name] Kitchen  CARDIAC CATHETERIZATION N/A 01/28/2015   Procedure: Temporary Pacemaker;  Surgeon: [name], MD;  Location: [facility];  Service: Cardiovascular;  Laterality: N/A;  . CORONARY ARTERY BYPASS GRAFT     Median sternotomy, extracorporeal circulation,  coronary artery bypass graft surgery x4 using a sequential left internal   mammary artery graft to the mid and distal left anterior descending, a   saphenous vein graft to diagonal branch of the left anterior descending,   and a saphenous vein graft to the obtuse marginal branch of left   circumflex coronary artery.    . EP IMPLANTABLE DEVICE N/A 12/12/2014   Procedure: Pacemaker Implant;  Surgeon: [name], MD;   Location: [facility];  Service: Cardiovascular;  Laterality: N/A;  . EP IMPLANTABLE DEVICE N/A 01/28/2015   Procedure: PPM Generator Removal;  Surgeon: [name], MD;  Location: [facility];  Service: Cardiovascular;  Laterality: N/A;  . EP IMPLANTABLE DEVICE N/A 03/20/2015   Procedure: Pacemaker Implant;  Surgeon: [name], MD;  Location: [facility];  Service: Cardiovascular;  Laterality: N/A;  . FLEXIBLE SIGMOIDOSCOPY  01/17/2012   Procedure: FLEXIBLE SIGMOIDOSCOPY;  Surgeon: Lafayette Dragon, MD;  Location: [facility];  Service: Endoscopy;  Laterality: N/A;  . MULTIPLE EXTRACTIONS WITH ALVEOLOPLASTY  12/01/2011   Procedure: MULTIPLE EXTRACION WITH ALVEOLOPLASTY;  Surgeon: Lenn Cal, DDS;  Location: [facility];  Service: Oral Surgery;  Laterality: N/A;  Extraction of tooth #'s 3,4,5,6,7,8,9,10,11,12,13,14,15,16,17,20,21,22,23,24,25,26,27,28,29,30,31,32 with alveoloplasty and bilateral mandibular lingual tori  . TONSILECTOMY, ADENOIDECTOMY, BILATERAL MYRINGOTOMY AND TUBES  1946    Allergies as of 05/15/2017      Reactions   Benzedrine [amphetamine] Nausea Only   Dexedrine [dextroamphetamine Sulfate Er] Other (See Comments)   agitation   Oxycodone Nausea And Vomiting   Vancomycin    Unknown - per [facility]      Medication List        Accurate as of 05/15/17 11:59 PM. Always use your most recent med list.          aspirin 81 MG chewable tablet Chew 81 mg by mouth at bedtime.   carbidopa-levodopa 25-100 MG tablet Commonly known as:  SINEMET IR Take 2 tablets by mouth 3 (three) times daily with meals.   carvedilol 3.125 MG tablet Commonly known as:  COREG Take 3.125 mg by mouth 2 (two) times daily with a meal. Hold for SBP less than 105   clonazePAM 0.5 MG tablet Commonly known as:  KLONOPIN Take 0.5 mg by mouth at bedtime.   docusate sodium 100 MG capsule Commonly known as:  COLACE Take 100 mg by mouth daily. Hold for > 2 loose stools in a day     donepezil 10 MG tablet Commonly known as:  ARICEPT Take 10 mg by mouth at bedtime.   doxazosin 1 MG tablet Commonly known as:  CARDURA Take 1 mg by mouth at bedtime.   fenofibrate 145 MG tablet Commonly known as:  TRICOR Take 145 mg by mouth daily. With a meal   Fish Oil 1000 MG Caps Take 1 capsule by mouth 2 (two) times daily.   furosemide 40 MG tablet Commonly known as:  LASIX Take 40 mg by mouth daily.   gabapentin 400 MG capsule Commonly known as:  NEURONTIN 400 mg 3 (three) times daily.   HUMALOG KWIKPEN 100 UNIT/ML KiwkPen Generic drug:  insulin lispro Inject 10 Units into the skin 3 (three) times daily. Hold for CBG  <  200. ( to be given in addition to sliding scale) Check FSBS before meals and at bedtime. SSI 201-25 4 units, 251-300 6 units, 301-350 8 units, 351-400 10 units   lamoTRIgine 25 MG tablet Commonly known as:  LAMICTAL Take 25 mg by mouth daily. give one tab with Lamictal 100 mg to equal 125 mg daily   lamoTRIgine 100 MG tablet Commonly known as:  LAMICTAL Take 100 mg by mouth daily. Give 100 mg tablet along with 25 mg tablet to equal 125 mg total   losartan 25 MG tablet Commonly known as:  COZAAR Take 25 mg by mouth every morning.   magnesium oxide 400 MG tablet Commonly known as:  MAG-OX Take 400 mg by mouth daily.   metFORMIN 1000 MG tablet Commonly known as:  GLUCOPHAGE Take 1,000 mg by mouth 2 (two) times daily.   multivitamin tablet Take 1 tablet by mouth daily.   nitroGLYCERIN 0.4 MG SL tablet Commonly known as:  NITROSTAT Place 0.4 mg under the tongue every 5 (five) minutes as needed for chest pain.   OXYGEN Inhale 2 L into the lungs continuous. To keep spo2 above 90%   polyethylene glycol packet Commonly known as:  MIRALAX / GLYCOLAX Take 17 g by mouth daily.   pravastatin 40 MG tablet Commonly known as:  PRAVACHOL Take 40 mg by mouth at bedtime.   TOUJEO MAX SOLOSTAR 300 UNIT/ML Sopn Generic drug:  Insulin  Glargine Inject 45 Units into the skin daily.   traMADol 50 MG tablet Commonly known as:  ULTRAM Take 50 mg by mouth as directed. Every shift for pain   traZODone 50 MG tablet Commonly known as:  DESYREL Take 50 mg by mouth at bedtime.   Vitamin D3 50000 units Caps Take 1 capsule by mouth once a week.       No orders of the defined types were placed in this encounter.   Immunization History  Administered Date(s) Administered  . Influenza-Unspecified 10/28/2016  . PPD Test 03/20/2013  . Pneumococcal Conjugate-13 10/09/2015  . Pneumococcal Polysaccharide-23 09/02/2014    Social History   Tobacco Use  . Smoking status: Former Smoker    Packs/day: 2.00    Years: 50.00    Pack years: 100.00    Last attempt to quit: 08/18/2014    Years since quitting: 2.8  . Smokeless tobacco: Former Network engineer Use Topics  . Alcohol use: No    Comment: quit 2003    Review of Systems  DATA OBTAINED: from patient-limited; nurse- no acute concerns GENERAL:  no fevers, fatigue, appetite changes SKIN: No itching, rash HEENT: No complaint RESPIRATORY: No cough, wheezing, SOB CARDIAC: No chest pain, palpitations, lower extremity edema  GI: No abdominal pain, No N/V/D or constipation, No heartburn or reflux  GU: No dysuria, frequency or urgency, or incontinence  MUSCULOSKELETAL: No unrelieved bone/joint pain NEUROLOGIC: No headache, dizziness  PSYCHIATRIC: No overt anxiety or sadness  Vitals:   05/15/17 1452  BP: 133/81  Pulse: 72  Resp: 18  Temp: 98.3 F (36.8 C)  SpO2: 94%   Body mass index is 40.47 kg/m. Physical Exam  GENERAL APPEARANCE: Alert, conversant, No acute distress  SKIN: No diaphoresis rash HEENT: Unremarkable RESPIRATORY: Breathing is even, unlabored. Lung sounds are clear   CARDIOVASCULAR: Heart RRR no murmurs, rubs or gallops. No peripheral edema  GASTROINTESTINAL: Abdomen is soft, non-tender, not distended w/ normal bowel sounds.  GENITOURINARY:  Bladder non tender, not distended  MUSCULOSKELETAL: No abnormal joints or musculature  NEUROLOGIC: Cranial nerves 2-12 grossly intact. Moves all extremities PSYCHIATRIC: Mood and affect appropriate to situation, no behavioral issues  Patient Active Problem List   Diagnosis Date Noted  . Peripheral vascular disease ([place]) 02/04/2017  . Controlled diabetes mellitus type 2 with complications ([place]) 87/56/4332  . Aspiration pneumonia ([place]) 09/30/2015  . Parkinsonism ([place]) 09/30/2015  . Tremor 08/11/2015  . Suicidal ideation 05/10/2015  . Bipolar depression ([place]) 05/10/2015  . Complete heart block ([place]) 03/20/2015  . Bradycardia 03/14/2015  . Dementia with behavioral disturbance 02/15/2015  . Staphylococcus aureus bacteremia with sepsis ([place]) 01/28/2015  . Infection of pacemaker pocket ([place]) 01/28/2015  . Acute respiratory failure ([place]) 01/26/2015  . AKI (acute kidney injury) ([place]) 01/26/2015  . Acute encephalopathy 01/26/2015  . Altered mental status   . Vitamin D deficiency 01/07/2015  . Mobitz type 2 second degree atrioventricular block 12/12/2014  . Pacemaker 12/12/2014  . Symptomatic bradycardia 12/03/2014  . Lower back pain 06/12/2012  . Right hip pain 06/12/2012  . Intertrigo 05/09/2012  . Insomnia 04/19/2012  . General weakness 03/07/2012  . 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 Diarrhea 01/17/2012  . Pyelonephritis 01/15/2012  . Diabetic peripheral neuropathy ([place]) 09/28/2011  . Urinary incontinence 09/28/2011  . Preventative health care 09/23/2011  . Gout 09/23/2011  . Bipolar affective disorder ([place]) 09/23/2011  . TIA (transient ischemic attack) 09/23/2011  . Chronic pain 09/23/2011  . DNR (do not resuscitate) 09/23/2011  . CVA (cerebral infarction) 09/23/2011  . 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 History of alcohol abuse 09/23/2011  . Benign prostatic hyperplasia   . Left ventricular systolic dysfunction   . Chronic systolic CHF (congestive heart  failure) ([place]) 05/05/2011  . Edema 01/03/2011  . CAD (coronary artery disease) 05/18/2010  . Hyperlipidemia 05/18/2010  . Other abnormality of urination(788.69) 11/25/2009  . Other specified forms of chronic ischemic heart disease 05/19/2009  . CAD, ARTERY BYPASS GRAFT 11/18/2008  . AMI, INFERIOR WALL 09/30/2008  . Obstructive sleep apnea 09/16/2008  . SINUS TACHYCARDIA 09/02/2008  . DM (diabetes mellitus), type 2 with peripheral vascular complications ([place]) 95/18/8416  . OBESITY 09/01/2008  . Hypertensive heart disease with heart failure ([place]) 09/01/2008  . TOBACCO ABUSE 07/31/2007  . 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    CMP     Component Value Date/Time   NA 142 02/11/2017   K 4.3 02/11/2017   CL 102 03/20/2015 1038   CO2 28 03/20/2015 1038   GLUCOSE 171 (H) 03/20/2015 1038   BUN 35 (A) 02/11/2017   CREATININE 2.2 (A) 02/11/2017   CREATININE 1.23 03/20/2015 1038   CREATININE 1.06 12/05/2014 1536   CALCIUM 9.6 03/20/2015 1038   PROT 5.8 (L) 02/01/2015 0245   ALBUMIN 2.2 (L) 02/01/2015 0245   AST 17 02/11/2017   ALT 6 (A) 02/11/2017   ALKPHOS 21 (A) 02/11/2017   BILITOT 0.4 02/01/2015 0245   GFRNONAA 55 (L) 03/20/2015 1038   GFRAA >60 03/20/2015 1038   Recent Labs    07/22/16 02/11/17  NA 141 142  K 3.6 4.3  BUN 19 35*  CREATININE 1.0 2.2*   Recent Labs    02/11/17  AST 17  ALT 6*  ALKPHOS 21*   Recent Labs    02/11/17  WBC 6.3  HGB 11.2*  HCT 31*  PLT 243   Recent Labs    02/11/17  CHOL 154  LDLCALC 59  TRIG 295*   Lab Results  Component Value Date   MICROALBUR >42.6 04/09/2016   Lab Results  Component Value Date   TSH 1.89 02/11/2017   Lab Results  Component Value Date   HGBA1C 8 02/11/2017   Lab Results  Component Value Date   CHOL 154 02/11/2017   HDL 36 02/11/2017   LDLCALC 59 02/11/2017   LDLDIRECT 85.2 06/13/2012   TRIG 295 (A) 02/11/2017   CHOLHDL 7 06/13/2012    Significant Diagnostic Results in last 30 days:   No results found.  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 3 times daily  Parkinsonism ([place]) Relatively stable; continue Sinemet 25-100 p.o. 3 times daily    Wasif Simonich D. [name] , MD

## 2017-06-09 ENCOUNTER — Non-Acute Institutional Stay (SKILLED_NURSING_FACILITY): Payer: Medicare Other | Admitting: Internal Medicine

## 2017-06-09 ENCOUNTER — Encounter: Payer: Self-pay | Admitting: Internal Medicine

## 2017-06-09 DIAGNOSIS — E1151 Type 2 diabetes mellitus with diabetic peripheral angiopathy without gangrene: Secondary | ICD-10-CM | POA: Diagnosis not present

## 2017-06-09 DIAGNOSIS — I110 Hypertensive heart disease with heart failure: Secondary | ICD-10-CM | POA: Diagnosis not present

## 2017-06-09 DIAGNOSIS — E782 Mixed hyperlipidemia: Secondary | ICD-10-CM

## 2017-06-09 NOTE — Progress Notes (Signed)
Location:  [facility] Room Number: 308-D Place of Service:  SNF (31)  [name], MD  Patient Care Team: [name], MD as PCP - General (Internal Medicine) [name], MD as Consulting Physician (Cardiology) [name], MD as Consulting Physician (Neurology)  Extended Emergency Contact Information Primary Emergency Contact: [name],[name] Address: [address]          [place], [place] [address] [name] of [place] Phone: [phone] Mobile Phone: [phone] Relation: Spouse Secondary Emergency Contact: [name], [name] of [place] Phone: [phone] Mobile Phone: [phone] Relation: Daughter    Allergies: Benzedrine [amphetamine]; Dexedrine [dextroamphetamine sulfate er]; Oxycodone; and Vancomycin  Chief Complaint  Patient presents with  . Medical Management of Chronic Issues    Routine visit  . Health Maintenance    DM eye exam due     HPI: Patient is 77 y.o. male who is being seen for routine issues of diabetes mellitus type 2, hypertension, and hyperlipidemia.  Past Medical History:  Diagnosis Date  . 2nd degree AV block    a. s/p STJ dual chamber pacemaker 11/2014  . Acute respiratory failure ([place]) 01/26/2015  . AKI (acute kidney injury) ([place]) 01/26/2015  . AMI, INFERIOR WALL 09/30/2008   Qualifier: Diagnosis of  By: Unk Lightning, RN, BSN, Melanie    . Benign neoplasm of colon     Adenomatous polyps  . Benign prostatic hyperplasia   . Bipolar affective disorder ([place])    . Bipolar depression ([place]) 05/10/2015  . BPH (benign prostatic hypertrophy)   . CAD (coronary artery disease)    a. s/p CABG  . CAD, ARTERY BYPASS GRAFT 11/18/2008   Qualifier: Diagnosis of  By: Angelena Form, MD, Harrell Gave    . Chronic pain    . CVA (cerebral infarction)     Small left occipital  . Dementia with behavioral disturbance 02/15/2015  . Depression    . Diabetic peripheral  neuropathy ([place]) 09/28/2011  . DM        . DM (diabetes mellitus), type 2 with peripheral vascular complications ([place]) 09/10/537   Qualifier: Diagnosis of  By: Burnett Kanaris    . Gout    . History of alcohol abuse    . Hyperlipidemia    . HYPERTENSION        . Hypertensive heart disease with heart failure ([place]) 09/01/2008   Qualifier: Diagnosis of  By: Burnett Kanaris    . Insomnia 04/19/2012  . Ischemic cardiomyopathy    . Narcolepsy    . Obesity   . OBESITY 09/01/2008   Qualifier: Diagnosis of  By: Burnett Kanaris    . OBSTRUCTIVE SLEEP APNEA        . Obstructive sleep apnea 09/16/2008   Qualifier: Diagnosis of  By: [name] MD, [name]   . Pacemaker 12/12/2014  . Parkinsonism ([place]) 09/30/2015  . [facility] spotted fever   . SINUS TACHYCARDIA 09/02/2008   Qualifier: Diagnosis of  By: Caryl Comes, MD, [name]   . TIA (transient ischemic attack)    . TOBACCO ABUSE 07/31/2007   Qualifier: Diagnosis of  By: [name] MD, [name]   . Tremor 08/11/2015  . UNIVERSAL ULCERATIVE COLITIS        . Vitamin D deficiency 01/07/2015    Past Surgical History:  Procedure Laterality Date  . CARDIAC CATHETERIZATION     Triple-vessel coronary artery disease. Low-normal left  ventricular systolic function with mild   anteroapical wall motion abnormality.   [name] Kitchen CARDIAC CATHETERIZATION N/A 01/28/2015   Procedure: Temporary Pacemaker;  Surgeon: [name], MD;  Location: [facility];  Service: Cardiovascular;  Laterality: N/A;  . CORONARY ARTERY BYPASS GRAFT     Median sternotomy, extracorporeal circulation,  coronary artery bypass graft surgery x4 using a sequential left internal   mammary artery graft to the mid and distal left anterior descending, a   saphenous vein graft to diagonal branch of the left anterior descending,   and a saphenous vein graft to the obtuse marginal branch of left   circumflex coronary artery.    . EP IMPLANTABLE DEVICE N/A 12/12/2014   Procedure:  Pacemaker Implant;  Surgeon: [name], MD;  Location: [facility];  Service: Cardiovascular;  Laterality: N/A;  . EP IMPLANTABLE DEVICE N/A 01/28/2015   Procedure: PPM Generator Removal;  Surgeon: [name], MD;  Location: [facility];  Service: Cardiovascular;  Laterality: N/A;  . EP IMPLANTABLE DEVICE N/A 03/20/2015   Procedure: Pacemaker Implant;  Surgeon: [name], MD;  Location: [facility];  Service: Cardiovascular;  Laterality: N/A;  . FLEXIBLE SIGMOIDOSCOPY  01/17/2012   Procedure: FLEXIBLE SIGMOIDOSCOPY;  Surgeon: [place] Dragon, MD;  Location: [facility];  Service: Endoscopy;  Laterality: N/A;  . MULTIPLE EXTRACTIONS WITH ALVEOLOPLASTY  12/01/2011   Procedure: MULTIPLE EXTRACION WITH ALVEOLOPLASTY;  Surgeon: Lenn Cal, DDS;  Location: [facility];  Service: Oral Surgery;  Laterality: N/A;  Extraction of tooth #'s 3,4,5,6,7,8,9,10,11,12,13,14,15,16,17,20,21,22,23,24,25,26,27,28,29,30,31,32 with alveoloplasty and bilateral mandibular lingual tori  . TONSILECTOMY, ADENOIDECTOMY, BILATERAL MYRINGOTOMY AND TUBES  1948    Allergies as of 06/09/2017      Reactions   Benzedrine [amphetamine] Nausea Only   Dexedrine [dextroamphetamine Sulfate Er] Other (See Comments)   agitation   Oxycodone Nausea And Vomiting   Vancomycin    Unknown - per [facility]      Medication List        Accurate as of 06/09/17 11:59 PM. Always use your most recent med list.          aspirin 81 MG chewable tablet Chew 81 mg by mouth at bedtime.   carbidopa-levodopa 25-100 MG tablet Commonly known as:  SINEMET IR Take 2 tablets by mouth 3 (three) times daily with meals.   carvedilol 3.125 MG tablet Commonly known as:  COREG Take 3.125 mg by mouth 2 (two) times daily with a meal. Hold for SBP less than 105   clonazePAM 0.5 MG tablet Commonly known as:  KLONOPIN Take 0.5 mg by mouth at bedtime.   docusate sodium 100 MG capsule Commonly known as:  COLACE Take 100 mg by  mouth daily. Hold for > 2 loose stools in a day   donepezil 10 MG tablet Commonly known as:  ARICEPT Take 10 mg by mouth at bedtime.   doxazosin 1 MG tablet Commonly known as:  CARDURA Take 1 mg by mouth at bedtime.   fenofibrate 145 MG tablet Commonly known as:  TRICOR Take 145 mg by mouth daily. With a meal   Fish Oil 1000 MG Caps Take 1 capsule by mouth 2 (two) times daily.   furosemide 40 MG tablet Commonly known as:  LASIX Take 40 mg by mouth daily.   gabapentin 400 MG capsule Commonly known as:  NEURONTIN Take 400 mg by mouth 3 (three) times daily.   HUMALOG KWIKPEN 100 UNIT/ML KiwkPen Generic drug:  insulin lispro Inject 13 Units into the skin 3 (three) times daily. Hold for CBG  < 200. ( to be given in addition to sliding scale) Check FSBS before meals and at bedtime. SSI 201-25 4 units, 251-300 6 units, 301-350 8 units, 351-400 10 units   lamoTRIgine 25 MG tablet Commonly known as:  LAMICTAL Take 25 mg by mouth daily. give one tab with Lamictal 100 mg to equal 125 mg daily   lamoTRIgine 100 MG tablet Commonly known as:  LAMICTAL Take 100 mg by mouth daily. Give 100 mg tablet along with 25 mg tablet to equal 125 mg total   losartan 25 MG tablet Commonly known as:  COZAAR Take 25 mg by mouth every morning.   magnesium oxide 400 MG tablet Commonly known as:  MAG-OX Take 400 mg by mouth daily.   metFORMIN 1000 MG tablet Commonly known as:  GLUCOPHAGE Take 1,000 mg by mouth 2 (two) times daily.   multivitamin tablet Take 1 tablet by mouth daily.   nitroGLYCERIN 0.4 MG SL tablet Commonly known as:  NITROSTAT Place 0.4 mg under the tongue every 5 (five) minutes as needed for chest pain.   OXYGEN Inhale 2 L into the lungs continuous. To keep spo2 above 90%   polyethylene glycol packet Commonly known as:  MIRALAX / GLYCOLAX Take 17 g by mouth daily.   pravastatin 40 MG tablet Commonly known as:  PRAVACHOL Take 40 mg by mouth at bedtime.   TOUJEO MAX  SOLOSTAR 300 UNIT/ML Sopn Generic drug:  Insulin Glargine Inject 50 Units into the skin at bedtime.   traMADol 50 MG tablet Commonly known as:  ULTRAM Take 50 mg by mouth as directed. Every shift for pain   traZODone 50 MG tablet Commonly known as:  DESYREL Take 50 mg by mouth at bedtime.   Vitamin D3 50000 units Caps Take 1 capsule by mouth once a week.       No orders of the defined types were placed in this encounter.   Immunization History  Administered Date(s) Administered  . Influenza-Unspecified 10/28/2016  . PPD Test 03/20/2013  . Pneumococcal Conjugate-13 10/09/2015  . Pneumococcal Polysaccharide-23 09/02/2014    Social History   Tobacco Use  . Smoking status: Former Smoker    Packs/day: 2.00    Years: 50.00    Pack years: 100.00    Last attempt to quit: 08/18/2014    Years since quitting: 2.8  . Smokeless tobacco: Former Network engineer Use Topics  . Alcohol use: No    Comment: quit 2003    Review of Systems  DATA OBTAINED: from patient, nurse GENERAL:  no fevers, fatigue, appetite changes SKIN: No itching, rash HEENT: No complaint RESPIRATORY: No cough, wheezing, SOB CARDIAC: No chest pain, palpitations, lower extremity edema  GI: No abdominal pain, No N/V/D or constipation, No heartburn or reflux  GU: No dysuria, frequency or urgency, or incontinence  MUSCULOSKELETAL: No unrelieved bone/joint pain NEUROLOGIC: No headache, dizziness  PSYCHIATRIC: No overt anxiety or sadness  Vitals:   06/09/17 1214  BP: 130/82  Pulse: 80  Resp: 19  Temp: 98 F (36.7 C)  SpO2: 93%   Body mass index is 40.68 kg/m. Physical Exam  GENERAL APPEARANCE: Alert, moderately conversant, No acute distress  SKIN: No diaphoresis rash HEENT: Unremarkable RESPIRATORY: Breathing is even, unlabored. Lung sounds are clear   CARDIOVASCULAR: Heart RRR no murmurs, rubs or gallops. No peripheral edema  GASTROINTESTINAL: Abdomen is soft, non-tender, not distended w/ normal  bowel sounds.  GENITOURINARY: Bladder non tender, not distended  MUSCULOSKELETAL: No abnormal joints or musculature NEUROLOGIC: Cranial nerves 2-12 grossly intact. Moves all extremities PSYCHIATRIC: Mood and affect appropriate to situation with some dementia, no behavioral issues  Patient Active Problem List   Diagnosis Date Noted  . Peripheral vascular disease ([place]) 02/04/2017  . Controlled diabetes mellitus type 2 with complications ([place]) 37/62/8315  . Aspiration pneumonia ([place]) 09/30/2015  . Parkinsonism ([place]) 09/30/2015  . Tremor 08/11/2015  . Suicidal ideation 05/10/2015  . Bipolar depression ([place]) 05/10/2015  . Complete heart block ([place]) 03/20/2015  . Bradycardia 03/14/2015  . Dementia with behavioral disturbance 02/15/2015  . Staphylococcus aureus bacteremia with sepsis ([place]) 01/28/2015  . Infection of pacemaker pocket ([place]) 01/28/2015  . Acute respiratory failure ([place]) 01/26/2015  . AKI (acute kidney injury) ([place]) 01/26/2015  . Acute encephalopathy 01/26/2015  . Altered mental status   . Vitamin D deficiency 01/07/2015  . Mobitz type 2 second degree atrioventricular block 12/12/2014  . Pacemaker 12/12/2014  . Symptomatic bradycardia 12/03/2014  . Lower back pain 06/12/2012  . Right hip pain 06/12/2012  . Intertrigo 05/09/2012  . Insomnia 04/19/2012  . General weakness 03/07/2012  . 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 Diarrhea 01/17/2012  . Pyelonephritis 01/15/2012  . Diabetic peripheral neuropathy ([place]) 09/28/2011  . Urinary incontinence 09/28/2011  . Preventative health care 09/23/2011  . Gout 09/23/2011  . Bipolar affective disorder ([place]) 09/23/2011  . TIA (transient ischemic attack) 09/23/2011  . Chronic pain 09/23/2011  . DNR (do not resuscitate) 09/23/2011  . CVA (cerebral infarction) 09/23/2011  . 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 History of alcohol abuse 09/23/2011  . Benign prostatic hyperplasia   . Left ventricular systolic dysfunction     . Chronic systolic CHF (congestive heart failure) ([place]) 05/05/2011  . Edema 01/03/2011  . CAD (coronary artery disease) 05/18/2010  . Hyperlipidemia 05/18/2010  . Other abnormality of urination(788.69) 11/25/2009  . Other specified forms of chronic ischemic heart disease 05/19/2009  . CAD, ARTERY BYPASS GRAFT 11/18/2008  . AMI, INFERIOR WALL 09/30/2008  . Obstructive sleep apnea 09/16/2008  . SINUS TACHYCARDIA 09/02/2008  . DM (diabetes mellitus), type 2 with peripheral vascular complications ([place]) 17/61/6073  . OBESITY 09/01/2008  . Hypertensive heart disease with heart failure ([place]) 09/01/2008  . TOBACCO ABUSE 07/31/2007  . 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    CMP     Component Value Date/Time   NA 142 02/11/2017   K 4.3 02/11/2017   CL 102 03/20/2015 1038   CO2 28 03/20/2015 1038   GLUCOSE 171 (H) 03/20/2015 1038   BUN 35 (A) 02/11/2017   CREATININE 2.2 (A) 02/11/2017   CREATININE 1.23 03/20/2015 1038   CREATININE 1.06 12/05/2014 1536   CALCIUM 9.6 03/20/2015 1038   PROT 5.8 (L) 02/01/2015 0245   ALBUMIN 2.2 (L) 02/01/2015 0245   AST 17 02/11/2017   ALT 6 (A) 02/11/2017   ALKPHOS 21 (A) 02/11/2017   BILITOT 0.4 02/01/2015 0245   GFRNONAA 55 (L) 03/20/2015 1038   GFRAA >60 03/20/2015 1038   Recent Labs    07/22/16 02/11/17  NA 141 142  K 3.6 4.3  BUN 19 35*  CREATININE 1.0 2.2*   Recent Labs    02/11/17  AST 17  ALT 6*  ALKPHOS 21*   Recent Labs    02/11/17  WBC 6.3  HGB 11.2*  HCT 31*  PLT 243   Recent Labs    02/11/17  CHOL 154  LDLCALC 59  TRIG 295*   Lab Results  Component Value Date   MICROALBUR >42.6 04/09/2016   Lab Results  Component Value Date   TSH 1.89 02/11/2017   Lab Results  Component Value Date   HGBA1C 8 02/11/2017   Lab Results  Component Value Date   CHOL 154 02/11/2017   HDL 36 02/11/2017   LDLCALC 59 02/11/2017   LDLDIRECT 85.2 06/13/2012   TRIG 295 (A) 02/11/2017   CHOLHDL 7 06/13/2012     Significant Diagnostic Results in last 30 days:  No results found.  Assessment and Plan  DM (diabetes mellitus), type 2 with peripheral vascular complications (HCC) Recent A1c is 8, which is an improvement from prior secondary to being on Toujeo 50 units daily, 13 units with each meal of NovoLog and Glucophage thousand milligrams twice daily me: Patient is on ARB and statin  Hypertensive heart disease with heart failure (HCC) Controlled; continue Coreg 3.125 mg twice daily, Cardura 1 mg nightly, Cozaar 25 mg daily and Lasix 40 mg daily  Hyperlipidemia LDL 59, HDL 36; continue Zocor 40 mg daily, fenofibrate 145 mg daily and fish oil 1000 mg twice daily     Nusayba Cadenas D. [name], MD

## 2017-06-10 ENCOUNTER — Encounter: Payer: Self-pay | Admitting: Internal Medicine

## 2017-06-10 NOTE — Assessment & Plan Note (Signed)
Relatively stable; continue Sinemet 25-100 p.o. 3 times daily

## 2017-06-10 NOTE — Assessment & Plan Note (Signed)
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 3 times daily

## 2017-06-10 NOTE — Assessment & Plan Note (Signed)
vitamin D several months ago was 20: Continue replacement 50,000 units weekly

## 2017-06-13 LAB — VITAMIN D 25 HYDROXY (VIT D DEFICIENCY, FRACTURES): Vit D, 25-Hydroxy: 41.63

## 2017-06-14 ENCOUNTER — Ambulatory Visit: Payer: Medicare Other | Admitting: Diagnostic Neuroimaging

## 2017-06-16 ENCOUNTER — Telehealth: Payer: Self-pay | Admitting: *Deleted

## 2017-06-16 NOTE — Telephone Encounter (Addendum)
Pt did not show for appt today 06-14-17.

## 2017-07-03 ENCOUNTER — Encounter: Payer: Self-pay | Admitting: Internal Medicine

## 2017-07-03 NOTE — Progress Notes (Signed)
Opened in error; Disregard.

## 2017-07-04 ENCOUNTER — Encounter: Payer: Self-pay | Admitting: Internal Medicine

## 2017-07-04 ENCOUNTER — Non-Acute Institutional Stay (SKILLED_NURSING_FACILITY): Payer: Medicare Other | Admitting: Internal Medicine

## 2017-07-04 DIAGNOSIS — I25708 Atherosclerosis of coronary artery bypass graft(s), unspecified, with other forms of angina pectoris: Secondary | ICD-10-CM

## 2017-07-04 DIAGNOSIS — I5022 Chronic systolic (congestive) heart failure: Secondary | ICD-10-CM

## 2017-07-04 DIAGNOSIS — E1142 Type 2 diabetes mellitus with diabetic polyneuropathy: Secondary | ICD-10-CM | POA: Diagnosis not present

## 2017-07-04 NOTE — Progress Notes (Signed)
Location:  [facility] Room Number: [phone] Place of Service:  SNF (31)  [name], MD  Patient Care Team: [name], MD as PCP - General (Internal Medicine) [name], MD as Consulting Physician (Cardiology) [name], MD as Consulting Physician (Neurology)  Extended Emergency Contact Information Primary Emergency Contact: [name],[name] Address: [address]          [place], [place] [address] [name] of [place] Phone: [phone] Mobile Phone: [phone] Relation: Spouse Secondary Emergency Contact: [name], [name] Phone: [phone] Mobile Phone: [phone] Relation: Daughter    Allergies: Benzedrine [amphetamine]; Dexedrine [dextroamphetamine sulfate er]; Oxycodone; and Vancomycin  Chief Complaint  Patient presents with  . Medical Management of Chronic Issues    Routine Visit    HPI: Patient is 77 y.o. male who is being seen for routine issues of coronary artery disease, chronic systolic congestive heart failure, and peripheral neuropathy secondary to diabetes mellitus type 2.  Past Medical History:  Diagnosis Date  . 2nd degree AV block    a. s/p STJ dual chamber pacemaker 11/2014  . Acute respiratory failure ([place]) 01/26/2015  . AKI (acute kidney injury) ([place]) 01/26/2015  . AMI, INFERIOR WALL 09/30/2008   Qualifier: Diagnosis of  By: Unk Lightning, RN, BSN, Melanie    . Benign neoplasm of colon     Adenomatous polyps  . Benign prostatic hyperplasia   . Bipolar affective disorder ([place])    . Bipolar depression ([place]) 05/10/2015  . BPH (benign prostatic hypertrophy)   . CAD (coronary artery disease)    a. s/p CABG  . CAD, ARTERY BYPASS GRAFT 11/18/2008   Qualifier: Diagnosis of  By: Angelena Form, MD, Harrell Gave    . Chronic pain    . CVA (cerebral infarction)     Small left occipital  . Dementia with behavioral disturbance 02/15/2015  . Depression     . Diabetic peripheral neuropathy ([place]) 09/28/2011  . DM        . DM (diabetes mellitus), type 2 with peripheral vascular complications ([place]) 2/99/2426   Qualifier: Diagnosis of  By: Burnett Kanaris    . Gout    . History of alcohol abuse    . Hyperlipidemia    . HYPERTENSION        . Hypertensive heart disease with heart failure ([place]) 09/01/2008   Qualifier: Diagnosis of  By: Burnett Kanaris    . Insomnia 04/19/2012  . Ischemic cardiomyopathy    . Narcolepsy    . Obesity   . OBESITY 09/01/2008   Qualifier: Diagnosis of  By: Burnett Kanaris    . OBSTRUCTIVE SLEEP APNEA        . Obstructive sleep apnea 09/16/2008   Qualifier: Diagnosis of  By: [name] MD, [name]   . Pacemaker 12/12/2014  . Parkinsonism ([place]) 09/30/2015  . [facility] spotted fever   . SINUS TACHYCARDIA 09/02/2008   Qualifier: Diagnosis of  By: Caryl Comes, MD, [name]   . TIA (transient ischemic attack)    . TOBACCO ABUSE 07/31/2007   Qualifier: Diagnosis of  By: [name] MD, [name]   . Tremor 08/11/2015  . UNIVERSAL ULCERATIVE COLITIS        . Vitamin D deficiency 01/07/2015    Past Surgical History:  Procedure Laterality Date  . CARDIAC CATHETERIZATION     Triple-vessel coronary artery disease. Low-normal left ventricular systolic function with  mild   anteroapical wall motion abnormality.   [name] Kitchen CARDIAC CATHETERIZATION N/A 01/28/2015   Procedure: Temporary Pacemaker;  Surgeon: [name], MD;  Location: [facility];  Service: Cardiovascular;  Laterality: N/A;  . CORONARY ARTERY BYPASS GRAFT     Median sternotomy, extracorporeal circulation,  coronary artery bypass graft surgery x4 using a sequential left internal   mammary artery graft to the mid and distal left anterior descending, a   saphenous vein graft to diagonal branch of the left anterior descending,   and a saphenous vein graft to the obtuse marginal branch of left   circumflex coronary artery.    . EP IMPLANTABLE DEVICE N/A  12/12/2014   Procedure: Pacemaker Implant;  Surgeon: [name], MD;  Location: [facility];  Service: Cardiovascular;  Laterality: N/A;  . EP IMPLANTABLE DEVICE N/A 01/28/2015   Procedure: PPM Generator Removal;  Surgeon: [name], MD;  Location: [facility];  Service: Cardiovascular;  Laterality: N/A;  . EP IMPLANTABLE DEVICE N/A 03/20/2015   Procedure: Pacemaker Implant;  Surgeon: [name], MD;  Location: [place] [facility];  Service: Cardiovascular;  Laterality: N/A;  . FLEXIBLE SIGMOIDOSCOPY  01/17/2012   Procedure: FLEXIBLE SIGMOIDOSCOPY;  Surgeon: Lafayette Dragon, MD;  Location: [facility];  Service: Endoscopy;  Laterality: N/A;  . MULTIPLE EXTRACTIONS WITH ALVEOLOPLASTY  12/01/2011   Procedure: MULTIPLE EXTRACION WITH ALVEOLOPLASTY;  Surgeon: Lenn Cal, DDS;  Location: [facility];  Service: Oral Surgery;  Laterality: N/A;  Extraction of tooth #'s 3,4,5,6,7,8,9,10,11,12,13,14,15,16,17,20,21,22,23,24,25,26,27,28,29,30,31,32 with alveoloplasty and bilateral mandibular lingual tori  . TONSILECTOMY, ADENOIDECTOMY, BILATERAL MYRINGOTOMY AND TUBES  1948    Allergies as of 07/04/2017      Reactions   Benzedrine [amphetamine] Nausea Only   Dexedrine [dextroamphetamine Sulfate Er] Other (See Comments)   agitation   Oxycodone Nausea And Vomiting   Vancomycin    Unknown - per [facility]      Medication List        Accurate as of 07/04/17 11:59 PM. Always use your most recent med list.          aspirin 81 MG chewable tablet Chew 81 mg by mouth at bedtime.   carbidopa-levodopa 25-100 MG tablet Commonly known as:  SINEMET IR Take 2 tablets by mouth 3 (three) times daily with meals.   carvedilol 3.125 MG tablet Commonly known as:  COREG Take 3.125 mg by mouth 2 (two) times daily with a meal. Hold for SBP less than 105   clonazePAM 0.5 MG tablet Commonly known as:  KLONOPIN Take 0.5 mg by mouth at bedtime.   docusate sodium 100 MG capsule Commonly known as:   COLACE Take 100 mg by mouth daily. Hold for > 2 loose stools in a day   donepezil 10 MG tablet Commonly known as:  ARICEPT Take 10 mg by mouth at bedtime.   doxazosin 1 MG tablet Commonly known as:  CARDURA Take 1 mg by mouth at bedtime.   fenofibrate 145 MG tablet Commonly known as:  TRICOR Take 145 mg by mouth daily. With a meal   Fish Oil 1000 MG Caps Take 1 capsule by mouth 2 (two) times daily.   furosemide 40 MG tablet Commonly known as:  LASIX Take 40 mg by mouth daily.   gabapentin 400 MG capsule Commonly known as:  NEURONTIN Take 400 mg by mouth 3 (three) times daily.   HUMALOG KWIKPEN 100 UNIT/ML KiwkPen Generic drug:  insulin lispro Inject  13 Units into the skin 3 (three) times daily. Hold for CBG  < 200. ( to be given in addition to sliding scale) Check FSBS before meals and at bedtime. SSI 201-25 4 units, 251-300 6 units, 301-350 8 units, 351-400 10 units   lamoTRIgine 25 MG tablet Commonly known as:  LAMICTAL Take 25 mg by mouth daily. give one tab with Lamictal 100 mg to equal 125 mg daily   lamoTRIgine 100 MG tablet Commonly known as:  LAMICTAL Take 100 mg by mouth daily. Give 100 mg tablet along with 25 mg tablet to equal 125 mg total   losartan 25 MG tablet Commonly known as:  COZAAR Take 25 mg by mouth every morning.   magnesium oxide 400 MG tablet Commonly known as:  MAG-OX Take 400 mg by mouth daily.   metFORMIN 1000 MG tablet Commonly known as:  GLUCOPHAGE Take 1,000 mg by mouth 2 (two) times daily.   multivitamin tablet Take 1 tablet by mouth daily.   nitroGLYCERIN 0.4 MG SL tablet Commonly known as:  NITROSTAT Place 0.4 mg under the tongue every 5 (five) minutes as needed for chest pain.   OXYGEN Inhale 2 L into the lungs continuous. To keep spo2 above 90%   polyethylene glycol packet Commonly known as:  MIRALAX / GLYCOLAX Take 17 g by mouth daily.   pravastatin 40 MG tablet Commonly known as:  PRAVACHOL Take 40 mg by mouth at  bedtime.   TOUJEO MAX SOLOSTAR 300 UNIT/ML Sopn Generic drug:  Insulin Glargine Inject 50 Units into the skin at bedtime.   traMADol 50 MG tablet Commonly known as:  ULTRAM Take 50 mg by mouth as directed. Every shift for pain   traZODone 50 MG tablet Commonly known as:  DESYREL Take 50 mg by mouth at bedtime.   Vitamin D3 50000 units Caps Take 1 capsule by mouth once a week.       No orders of the defined types were placed in this encounter.   Immunization History  Administered Date(s) Administered  . Influenza-Unspecified 10/28/2016  . PPD Test 03/20/2013  . Pneumococcal Conjugate-13 10/09/2015  . Pneumococcal Polysaccharide-23 09/02/2014    Social History   Tobacco Use  . Smoking status: Former Smoker    Packs/day: 2.00    Years: 50.00    Pack years: 100.00    Last attempt to quit: 08/18/2014    Years since quitting: 2.9  . Smokeless tobacco: Former Network engineer Use Topics  . Alcohol use: No    Comment: quit 2003    Review of Systems  DATA OBTAINED: from patient-limited; nursing-no acute concerns GENERAL:  no fevers, fatigue, appetite changes SKIN: No itching, rash HEENT: No complaint RESPIRATORY: No cough, wheezing, SOB CARDIAC: No chest pain, palpitations, lower extremity edema  GI: No abdominal pain, No N/V/D or constipation, No heartburn or reflux  GU: No dysuria, frequency or urgency, or incontinence  MUSCULOSKELETAL: No unrelieved bone/joint pain NEUROLOGIC: No headache, dizziness  PSYCHIATRIC: No overt anxiety or sadness  Vitals:   07/04/17 0956  BP: 130/80  Pulse: 84  Resp: 18  Temp: 97.7 F (36.5 C)  SpO2: 94%   Body mass index is 40.54 kg/m. Physical Exam  GENERAL APPEARANCE: Alert, minimally conversant, No acute distress  SKIN: No diaphoresis rash HEENT: Unremarkable RESPIRATORY: Breathing is even, unlabored. Lung sounds are clear   CARDIOVASCULAR: Heart RRR no murmurs, rubs or gallops. No peripheral edema  GASTROINTESTINAL:  Abdomen is soft, non-tender, not distended w/ normal bowel  sounds.  GENITOURINARY: Bladder non tender, not distended  MUSCULOSKELETAL: No abnormal joints or musculature NEUROLOGIC: Cranial nerves 2-12 grossly intact. Moves all extremities; tremor not noted today PSYCHIATRIC: Mood and affect appropriate to situation with dementia, no behavioral issues  Patient Active Problem List   Diagnosis Date Noted  . Peripheral vascular disease ([place]) 02/04/2017  . Controlled diabetes mellitus type 2 with complications ([place]) 50/09/3816  . Aspiration pneumonia ([place]) 09/30/2015  . Parkinsonism ([place]) 09/30/2015  . Tremor 08/11/2015  . Suicidal ideation 05/10/2015  . Bipolar depression ([place]) 05/10/2015  . Complete heart block ([place]) 03/20/2015  . Bradycardia 03/14/2015  . Dementia with behavioral disturbance 02/15/2015  . Staphylococcus aureus bacteremia with sepsis ([place]) 01/28/2015  . Infection of pacemaker pocket ([place]) 01/28/2015  . Acute respiratory failure ([place]) 01/26/2015  . AKI (acute kidney injury) ([place]) 01/26/2015  . Acute encephalopathy 01/26/2015  . Altered mental status   . Vitamin D deficiency 01/07/2015  . [place]itz type 2 second degree atrioventricular block 12/12/2014  . Pacemaker 12/12/2014  . Symptomatic bradycardia 12/03/2014  . Lower back pain 06/12/2012  . Right hip pain 06/12/2012  . Intertrigo 05/09/2012  . Insomnia 04/19/2012  . General weakness 03/07/2012  . 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 Diarrhea 01/17/2012  . Pyelonephritis 01/15/2012  . Diabetic peripheral neuropathy ([place]) 09/28/2011  . Urinary incontinence 09/28/2011  . Preventative health care 09/23/2011  . Gout 09/23/2011  . Bipolar affective disorder ([place]) 09/23/2011  . TIA (transient ischemic attack) 09/23/2011  . Chronic pain 09/23/2011  . DNR (do not resuscitate) 09/23/2011  . CVA (cerebral infarction) 09/23/2011  . 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 History of alcohol abuse 09/23/2011  .  Benign prostatic hyperplasia   . Left ventricular systolic dysfunction   . Chronic systolic CHF (congestive heart failure) ([place]) 05/05/2011  . Edema 01/03/2011  . CAD (coronary artery disease) 05/18/2010  . Hyperlipidemia 05/18/2010  . Other abnormality of urination(788.69) 11/25/2009  . Other specified forms of chronic ischemic heart disease 05/19/2009  . CAD, ARTERY BYPASS GRAFT 11/18/2008  . AMI, INFERIOR WALL 09/30/2008  . Obstructive sleep apnea 09/16/2008  . SINUS TACHYCARDIA 09/02/2008  . DM (diabetes mellitus), type 2 with peripheral vascular complications ([place]) 29/93/7169  . OBESITY 09/01/2008  . Hypertensive heart disease with heart failure ([place]) 09/01/2008  . TOBACCO ABUSE 07/31/2007  . 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    CMP     Component Value Date/Time   NA 142 02/11/2017   K 4.3 02/11/2017   CL 102 03/20/2015 1038   CO2 28 03/20/2015 1038   GLUCOSE 171 (H) 03/20/2015 1038   BUN 35 (A) 02/11/2017   CREATININE 2.2 (A) 02/11/2017   CREATININE 1.23 03/20/2015 1038   CREATININE 1.06 12/05/2014 1536   CALCIUM 9.6 03/20/2015 1038   PROT 5.8 (L) 02/01/2015 0245   ALBUMIN 2.2 (L) 02/01/2015 0245   AST 17 02/11/2017   ALT 6 (A) 02/11/2017   ALKPHOS 21 (A) 02/11/2017   BILITOT 0.4 02/01/2015 0245   GFRNONAA 55 (L) 03/20/2015 1038   GFRAA >60 03/20/2015 1038   Recent Labs    02/11/17  NA 142  K 4.3  BUN 35*  CREATININE 2.2*   Recent Labs    02/11/17  AST 17  ALT 6*  ALKPHOS 21*   Recent Labs    02/11/17  WBC 6.3  HGB 11.2*  HCT 31*  PLT 243   Recent Labs    02/11/17  CHOL 154  LDLCALC 59  TRIG 295*  Lab Results  Component Value Date   MICROALBUR >42.6 04/09/2016   Lab Results  Component Value Date   TSH 1.89 02/11/2017   Lab Results  Component Value Date   HGBA1C 8 02/11/2017   Lab Results  Component Value Date   CHOL 154 02/11/2017   HDL 36 02/11/2017   LDLCALC 59 02/11/2017   LDLDIRECT 85.2 06/13/2012   TRIG 295  (A) 02/11/2017   CHOLHDL 7 06/13/2012    Significant Diagnostic Results in last 30 days:  No results found.  Assessment and Plan  CAD (coronary artery disease) No recent chest pain reported; continue Coreg 3.125 mg twice daily, ASA 81 mg daily and nitroglycerin sublingual as needed; patient is on statin  Chronic systolic CHF (congestive heart failure) (HCC) No exacerbation; continue Coreg 3.25 mg twice daily, Lasix 40 mg daily, and Cozaar 25 mg daily  Diabetic peripheral neuropathy (HCC) No complaints or problems; continue Neurontin 400 mg 3 times daily     Anne D. [name], MD

## 2017-07-09 ENCOUNTER — Encounter: Payer: Self-pay | Admitting: Internal Medicine

## 2017-07-09 NOTE — Assessment & Plan Note (Signed)
Controlled; continue Coreg 3.125 mg twice daily, Cardura 1 mg nightly, Cozaar 25 mg daily and Lasix 40 mg daily

## 2017-07-09 NOTE — Assessment & Plan Note (Signed)
LDL 59, HDL 36; continue Zocor 40 mg daily, fenofibrate 145 mg daily and fish oil 1000 mg twice daily

## 2017-07-09 NOTE — Assessment & Plan Note (Signed)
Recent A1c is 8, which is an improvement from prior secondary to being on Toujeo 50 units daily, 13 units with each meal of NovoLog and Glucophage thousand milligrams twice daily me: Patient is on ARB and statin

## 2017-07-19 ENCOUNTER — Ambulatory Visit (INDEPENDENT_AMBULATORY_CARE_PROVIDER_SITE_OTHER): Payer: Medicare Other | Admitting: *Deleted

## 2017-07-19 ENCOUNTER — Telehealth: Payer: Self-pay | Admitting: Cardiology

## 2017-07-19 DIAGNOSIS — I442 Atrioventricular block, complete: Secondary | ICD-10-CM

## 2017-07-19 NOTE — Telephone Encounter (Signed)
Confirmed remote transmission w/ pt nurse.   

## 2017-07-21 ENCOUNTER — Encounter: Payer: Self-pay | Admitting: Cardiology

## 2017-07-21 NOTE — Progress Notes (Signed)
Remote pacemaker transmission.   

## 2017-07-28 ENCOUNTER — Encounter: Payer: Self-pay | Admitting: Internal Medicine

## 2017-07-28 NOTE — Assessment & Plan Note (Signed)
No exacerbation; continue Coreg 3.25 mg twice daily, Lasix 40 mg daily, and Cozaar 25 mg daily

## 2017-07-28 NOTE — Assessment & Plan Note (Signed)
No recent chest pain reported; continue Coreg 3.125 mg twice daily, ASA 81 mg daily and nitroglycerin sublingual as needed; patient is on statin

## 2017-07-28 NOTE — Assessment & Plan Note (Signed)
No complaints or problems; continue Neurontin 400 mg 3 times daily

## 2017-07-31 ENCOUNTER — Encounter: Payer: Self-pay | Admitting: Internal Medicine

## 2017-07-31 ENCOUNTER — Non-Acute Institutional Stay (SKILLED_NURSING_FACILITY): Payer: Medicare Other | Admitting: Internal Medicine

## 2017-07-31 DIAGNOSIS — E118 Type 2 diabetes mellitus with unspecified complications: Secondary | ICD-10-CM

## 2017-07-31 DIAGNOSIS — F319 Bipolar disorder, unspecified: Secondary | ICD-10-CM | POA: Diagnosis not present

## 2017-07-31 DIAGNOSIS — Z794 Long term (current) use of insulin: Secondary | ICD-10-CM

## 2017-07-31 DIAGNOSIS — I739 Peripheral vascular disease, unspecified: Secondary | ICD-10-CM | POA: Diagnosis not present

## 2017-07-31 NOTE — Progress Notes (Signed)
Location:  [facility] Room Number: [phone] Place of Service:  SNF [phone])  [name]. Victor Coil, MD  Patient Care Team: [name], MD as PCP - General (Internal Medicine) [name], MD as Consulting Physician (Cardiology) [name], MD as Consulting Physician (Neurology)  Extended Emergency Contact Information Primary Emergency Contact: [name],[name] Address: [address]          [place], [place] [address] [name] of [place] Phone: [phone] Mobile Phone: [phone] Relation: Spouse Secondary Emergency Contact: [name], [name] of [place] Phone: [phone] Mobile Phone: [phone] Relation: Daughter    Allergies: Benzedrine [amphetamine]; Dexedrine [dextroamphetamine sulfate er]; Oxycodone; and Vancomycin  Chief Complaint  Patient presents with  . Medical Management of Chronic Issues    Routine Visit    HPI: Patient is 77 y.o. male who being seen for routine issues of peripheral vascular disease, diabetes mellitus type 2, and bipolar depression.  Past Medical History:  Diagnosis Date  . 2nd degree AV block    a. s/p STJ dual chamber pacemaker 11/2014  . Acute respiratory failure ([place]) 01/26/2015  . AKI (acute kidney injury) ([place]) 01/26/2015  . AMI, INFERIOR WALL 09/30/2008   Qualifier: Diagnosis of  By: Unk Lightning, RN, BSN, Melanie    . Benign neoplasm of colon     Adenomatous polyps  . Benign prostatic hyperplasia   . Bipolar affective disorder ([place])    . Bipolar depression ([place]) 05/10/2015  . BPH (benign prostatic hypertrophy)   . CAD (coronary artery disease)    a. s/p CABG  . CAD, ARTERY BYPASS GRAFT 11/18/2008   Qualifier: Diagnosis of  By: Angelena Form, MD, Harrell Gave    . Chronic pain    . CVA (cerebral infarction)     Small left occipital  . Dementia with behavioral disturbance 02/15/2015  . Depression    . Diabetic peripheral neuropathy ([place]) 09/28/2011  .  DM        . DM (diabetes mellitus), type 2 with peripheral vascular complications ([place]) 02/10/5807   Qualifier: Diagnosis of  By: Burnett Kanaris    . Gout    . History of alcohol abuse    . Hyperlipidemia    . HYPERTENSION        . Hypertensive heart disease with heart failure ([place]) 09/01/2008   Qualifier: Diagnosis of  By: Burnett Kanaris    . Insomnia 04/19/2012  . Ischemic cardiomyopathy    . Narcolepsy    . Obesity   . OBESITY 09/01/2008   Qualifier: Diagnosis of  By: Burnett Kanaris    . OBSTRUCTIVE SLEEP APNEA        . Obstructive sleep apnea 09/16/2008   Qualifier: Diagnosis of  By: [name] MD, [name]   . Pacemaker 12/12/2014  . Parkinsonism ([place]) 09/30/2015  . [facility] spotted fever   . SINUS TACHYCARDIA 09/02/2008   Qualifier: Diagnosis of  By: Caryl Comes, MD, [name]   . TIA (transient ischemic attack)    . TOBACCO ABUSE 07/31/2007   Qualifier: Diagnosis of  By: [name] MD, [name]   . Tremor 08/11/2015  . UNIVERSAL ULCERATIVE COLITIS        . Vitamin D deficiency 01/07/2015    Past Surgical History:  Procedure Laterality Date  . CARDIAC CATHETERIZATION     Triple-vessel coronary artery disease. Low-normal left ventricular systolic function with mild   anteroapical wall motion abnormality.   [name] Kitchen  CARDIAC CATHETERIZATION N/A 01/28/2015   Procedure: Temporary Pacemaker;  Surgeon: [name], MD;  Location: [facility];  Service: Cardiovascular;  Laterality: N/A;  . CORONARY ARTERY BYPASS GRAFT     Median sternotomy, extracorporeal circulation,  coronary artery bypass graft surgery x4 using a sequential left internal   mammary artery graft to the mid and distal left anterior descending, a   saphenous vein graft to diagonal branch of the left anterior descending,   and a saphenous vein graft to the obtuse marginal branch of left   circumflex coronary artery.    . EP IMPLANTABLE DEVICE N/A 12/12/2014   Procedure: Pacemaker Implant;  Surgeon: [name], MD;  Location: [facility];  Service: Cardiovascular;  Laterality: N/A;  . EP IMPLANTABLE DEVICE N/A 01/28/2015   Procedure: PPM Generator Removal;  Surgeon: [name], MD;  Location: [facility];  Service: Cardiovascular;  Laterality: N/A;  . EP IMPLANTABLE DEVICE N/A 03/20/2015   Procedure: Pacemaker Implant;  Surgeon: [name], MD;  Location: [facility];  Service: Cardiovascular;  Laterality: N/A;  . FLEXIBLE SIGMOIDOSCOPY  01/17/2012   Procedure: FLEXIBLE SIGMOIDOSCOPY;  Surgeon: Lafayette Dragon, MD;  Location: [facility];  Service: Endoscopy;  Laterality: N/A;  . MULTIPLE EXTRACTIONS WITH ALVEOLOPLASTY  12/01/2011   Procedure: MULTIPLE EXTRACION WITH ALVEOLOPLASTY;  Surgeon: Lenn Cal, DDS;  Location: [facility];  Service: Oral Surgery;  Laterality: N/A;  Extraction of tooth #'s 3,4,5,6,7,8,9,10,11,12,13,14,15,16,17,20,21,22,23,24,25,26,27,28,29,30,31,32 with alveoloplasty and bilateral mandibular lingual tori  . TONSILECTOMY, ADENOIDECTOMY, BILATERAL MYRINGOTOMY AND TUBES  1948    Allergies as of 07/31/2017      Reactions   Benzedrine [amphetamine] Nausea Only   Dexedrine [dextroamphetamine Sulfate Er] Other (See Comments)   agitation   Oxycodone Nausea And Vomiting   Vancomycin    Unknown - per [facility]      Medication List        Accurate as of 07/31/17 11:59 PM. Always use your most recent med list.          aspirin 81 MG chewable tablet Chew 81 mg by mouth at bedtime.   carbidopa-levodopa 25-100 MG tablet Commonly known as:  SINEMET IR Take 2 tablets by mouth 3 (three) times daily with meals.   carvedilol 3.125 MG tablet Commonly known as:  COREG Take 3.125 mg by mouth 2 (two) times daily with a meal. Hold for SBP less than 105   clonazePAM 0.5 MG tablet Commonly known as:  KLONOPIN Take 0.5 mg by mouth at bedtime.   donepezil 10 MG tablet Commonly known as:  ARICEPT Take 10 mg by mouth at bedtime.   doxazosin 1 MG  tablet Commonly known as:  CARDURA Take 1 mg by mouth at bedtime.   fenofibrate 145 MG tablet Commonly known as:  TRICOR Take 145 mg by mouth daily. With a meal   Fish Oil 1000 MG Caps Take 1 capsule by mouth 2 (two) times daily.   furosemide 40 MG tablet Commonly known as:  LASIX Take 40 mg by mouth daily.   gabapentin 400 MG capsule Commonly known as:  NEURONTIN Take 400 mg by mouth 3 (three) times daily.   HUMALOG KWIKPEN 100 UNIT/ML KiwkPen Generic drug:  insulin lispro Inject 13 Units into the skin 3 (three) times daily. Hold for CBG  < 200. ( to be given in addition to sliding scale) Check FSBS before meals and at bedtime. SSI 201-25 4 units, 251-300 6  units, 301-350 8 units, 351-400 10 units   lamoTRIgine 25 MG tablet Commonly known as:  LAMICTAL Take 25 mg by mouth daily. give one tab with Lamictal 100 mg to equal 125 mg daily   lamoTRIgine 100 MG tablet Commonly known as:  LAMICTAL Take 100 mg by mouth daily. Give 100 mg tablet along with 25 mg tablet to equal 125 mg total   lidocaine 5 % Commonly known as:  LIDODERM Place 1 patch onto the skin. Apply 1 patch to lower back on in AM. Remove patch from lower back in PM Remove & Discard patch within 12 hours or as directed by MD   losartan 25 MG tablet Commonly known as:  COZAAR Take 25 mg by mouth every morning.   magnesium oxide 400 MG tablet Commonly known as:  MAG-OX Take 400 mg by mouth daily.   metFORMIN 1000 MG tablet Commonly known as:  GLUCOPHAGE Take 1,000 mg by mouth 2 (two) times daily.   multivitamin tablet Take 1 tablet by mouth daily.   nitroGLYCERIN 0.4 MG SL tablet Commonly known as:  NITROSTAT Place 0.4 mg under the tongue every 5 (five) minutes as needed for chest pain.   OXYGEN Inhale 2 L into the lungs continuous. To keep spo2 above 90%   polyethylene glycol packet Commonly known as:  MIRALAX / GLYCOLAX Take 17 g by mouth daily.   pravastatin 40 MG tablet Commonly known as:   PRAVACHOL Take 40 mg by mouth at bedtime.   TOUJEO MAX SOLOSTAR 300 UNIT/ML Sopn Generic drug:  Insulin Glargine Inject 50 Units into the skin at bedtime.   traMADol 50 MG tablet Commonly known as:  ULTRAM Take 50 mg by mouth as directed. Every shift for pain   traZODone 50 MG tablet Commonly known as:  DESYREL Take 50 mg by mouth at bedtime.   Vitamin D3 50000 units Caps Take 1 capsule by mouth once a week.       No orders of the defined types were placed in this encounter.   Immunization History  Administered Date(s) Administered  . Influenza-Unspecified 10/28/2016  . PPD Test 03/20/2013  . Pneumococcal Conjugate-13 10/09/2015  . Pneumococcal Polysaccharide-23 09/02/2014    Social History   Tobacco Use  . Smoking status: Former Smoker    Packs/day: 2.00    Years: 50.00    Pack years: 100.00    Last attempt to quit: 08/18/2014    Years since quitting: 3.0  . Smokeless tobacco: Former Network engineer Use Topics  . Alcohol use: No    Comment: quit 2003    Review of Systems  DATA OBTAINED: from patient, nurse GENERAL:  no fevers, fatigue, appetite changes SKIN: No itching, rash HEENT: No complaint RESPIRATORY: No cough, wheezing, SOB CARDIAC: No chest pain, palpitations, lower extremity edema  GI: No abdominal pain, No N/V/D or constipation, No heartburn or reflux  GU: No dysuria, frequency or urgency, or incontinence  MUSCULOSKELETAL: No unrelieved bone/joint pain NEUROLOGIC: No headache, dizziness  PSYCHIATRIC: No overt anxiety or sadness  Vitals:   07/31/17 1344  BP: 131/60  Pulse: 60  Resp: 18  Temp: (!) 97.1 F (36.2 C)   Body mass index is 40.17 kg/m. Physical Exam  GENERAL APPEARANCE: Alert, conversant, No acute distress, somewhat grumpy white male SKIN: No diaphoresis rash HEENT: Unremarkable RESPIRATORY: Breathing is even, unlabored. Lung sounds are clear   CARDIOVASCULAR: Heart RRR no murmurs, rubs or gallops. No peripheral edema    GASTROINTESTINAL: Abdomen is soft,  non-tender, not distended w/ normal bowel sounds.  GENITOURINARY: Bladder non tender, not distended  MUSCULOSKELETAL: No abnormal joints or musculature on generalized stiffness to joints NEUROLOGIC: Cranial nerves 2-12 grossly intact. Moves all extremities PSYCHIATRIC: Affect, no behavioral issues  Patient Active Problem List   Diagnosis Date Noted  . Peripheral vascular disease ([place]) 02/04/2017  . Controlled diabetes mellitus type 2 with complications ([place]) 40/81/4481  . Aspiration pneumonia ([place]) 09/30/2015  . Parkinsonism ([place]) 09/30/2015  . Tremor 08/11/2015  . Suicidal ideation 05/10/2015  . Bipolar depression ([place]) 05/10/2015  . Complete heart block ([place]) 03/20/2015  . Bradycardia 03/14/2015  . Dementia with behavioral disturbance 02/15/2015  . Staphylococcus aureus bacteremia with sepsis ([place]) 01/28/2015  . Infection of pacemaker pocket ([place]) 01/28/2015  . Acute respiratory failure ([place]) 01/26/2015  . AKI (acute kidney injury) ([place]) 01/26/2015  . Acute encephalopathy 01/26/2015  . Altered mental status   . Vitamin D deficiency 01/07/2015  . Mobitz type 2 second degree atrioventricular block 12/12/2014  . Pacemaker 12/12/2014  . Symptomatic bradycardia 12/03/2014  . Lower back pain 06/12/2012  . Right hip pain 06/12/2012  . Intertrigo 05/09/2012  . Insomnia 04/19/2012  . General weakness 03/07/2012  . 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 Diarrhea 01/17/2012  . Pyelonephritis 01/15/2012  . Diabetic peripheral neuropathy ([place]) 09/28/2011  . Urinary incontinence 09/28/2011  . Preventative health care 09/23/2011  . Gout 09/23/2011  . Bipolar affective disorder ([place]) 09/23/2011  . TIA (transient ischemic attack) 09/23/2011  . Chronic pain 09/23/2011  . DNR (do not resuscitate) 09/23/2011  . CVA (cerebral infarction) 09/23/2011  . 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 History of alcohol abuse 09/23/2011  . Benign prostatic  hyperplasia   . Left ventricular systolic dysfunction   . Chronic systolic CHF (congestive heart failure) ([place]) 05/05/2011  . Edema 01/03/2011  . CAD (coronary artery disease) 05/18/2010  . Hyperlipidemia 05/18/2010  . Other abnormality of urination(788.69) 11/25/2009  . Other specified forms of chronic ischemic heart disease 05/19/2009  . CAD, ARTERY BYPASS GRAFT 11/18/2008  . AMI, INFERIOR WALL 09/30/2008  . Obstructive sleep apnea 09/16/2008  . SINUS TACHYCARDIA 09/02/2008  . DM (diabetes mellitus), type 2 with peripheral vascular complications ([place]) 85/63/1497  . OBESITY 09/01/2008  . Hypertensive heart disease with heart failure ([place]) 09/01/2008  . TOBACCO ABUSE 07/31/2007  . 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    CMP     Component Value Date/Time   NA 142 02/11/2017   K 4.3 02/11/2017   CL 102 03/20/2015 1038   CO2 28 03/20/2015 1038   GLUCOSE 171 (H) 03/20/2015 1038   BUN 35 (A) 02/11/2017   CREATININE 2.2 (A) 02/11/2017   CREATININE 1.23 03/20/2015 1038   CREATININE 1.06 12/05/2014 1536   CALCIUM 9.6 03/20/2015 1038   PROT 5.8 (L) 02/01/2015 0245   ALBUMIN 2.2 (L) 02/01/2015 0245   AST 17 02/11/2017   ALT 6 (A) 02/11/2017   ALKPHOS 21 (A) 02/11/2017   BILITOT 0.4 02/01/2015 0245   GFRNONAA 55 (L) 03/20/2015 1038   GFRAA >60 03/20/2015 1038   Recent Labs    02/11/17  NA 142  K 4.3  BUN 35*  CREATININE 2.2*   Recent Labs    02/11/17  AST 17  ALT 6*  ALKPHOS 21*   Recent Labs    02/11/17  WBC 6.3  HGB 11.2*  HCT 31*  PLT 243   Recent Labs    02/11/17  CHOL 154  LDLCALC 59  TRIG 295*  Lab Results  Component Value Date   MICROALBUR >42.6 04/09/2016   Lab Results  Component Value Date   TSH 1.89 02/11/2017   Lab Results  Component Value Date   HGBA1C 8 02/11/2017   Lab Results  Component Value Date   CHOL 154 02/11/2017   HDL 36 02/11/2017   LDLCALC 59 02/11/2017   LDLDIRECT 85.2 06/13/2012   TRIG 295 (A) 02/11/2017    CHOLHDL 7 06/13/2012    Significant Diagnostic Results in last 30 days:  No results found.  Assessment and Plan  Peripheral vascular disease ([place]) No reported wounds; continue ASA 81 mg daily patient with poorly controlled diabetes but last A1c was 8.0 which is really good for this noncompliant patient; patient is on statin TriCor and fish oil  Controlled diabetes mellitus type 2 with complications ([place]) S9H is 8 which is really really good with this noncompliant patient; patient is on statin and ARB: Continue Glucophage 1000 mg twice daily, Toujeo 50 units at bedtime and 13 units Humalog with every meal  Bipolar depression ([place]) Appears controlled on trazodone 50 mg nightly; continue current medication     Victor Garis D. Victor Coil, MD

## 2017-08-09 ENCOUNTER — Non-Acute Institutional Stay (SKILLED_NURSING_FACILITY): Payer: Medicare Other

## 2017-08-09 DIAGNOSIS — Z0000 Encounter for general adult medical examination without abnormal findings: Secondary | ICD-10-CM | POA: Diagnosis not present

## 2017-08-09 NOTE — Patient Instructions (Addendum)
[name] , Thank you for taking time to come for your Medicare Wellness Visit. I appreciate your ongoing commitment to your health goals. Please review the following plan we discussed and let me know if I can assist you in the future.   Screening recommendations/referrals: Colonoscopy excluded, over age 79 Recommended yearly ophthalmology/optometry visit for glaucoma screening and checkup Recommended yearly dental visit for hygiene and checkup  Vaccinations: Influenza vaccine up to date, due 2019 fall season Pneumococcal vaccine up to date, completed Tdap vaccine due, ordered Shingles vaccine not in past records    Advanced directives: in chart  Conditions/risks identified: none  Next appointment: Dr. [name] makes rounds  Preventive Care 40 Years and Older, Male Preventive care refers to lifestyle choices and visits with your health care provider that can promote health and wellness. What does preventive care include?  A yearly physical exam. This is also called an annual well check.  Dental exams once or twice a year.  Routine eye exams. Ask your health care provider how often you should have your eyes checked.  Personal lifestyle choices, including:  Daily care of your teeth and gums.  Regular physical activity.  Eating a healthy diet.  Avoiding tobacco and drug use.  Limiting alcohol use.  Practicing safe sex.  Taking low doses of aspirin every day.  Taking vitamin and mineral supplements as recommended by your health care provider. What happens during an annual well check? The services and screenings done by your health care provider during your annual well check will depend on your age, overall health, lifestyle risk factors, and family history of disease. Counseling  Your health care provider may ask you questions about your:  Alcohol use.  Tobacco use.  Drug use.  Emotional well-being.  Home and relationship well-being.  Sexual  activity.  Eating habits.  History of falls.  Memory and ability to understand (cognition).  Work and work Statistician. Screening  You may have the following tests or measurements:  Height, weight, and BMI.  Blood pressure.  Lipid and cholesterol levels. These may be checked every 5 years, or more frequently if you are over 19 years old.  Skin check.  Lung cancer screening. You may have this screening every year starting at age 72 if you have a 30-pack-year history of smoking and currently smoke or have quit within the past 15 years.  Fecal occult blood test (FOBT) of the stool. You may have this test every year starting at age 73.  Flexible sigmoidoscopy or colonoscopy. You may have a sigmoidoscopy every 5 years or a colonoscopy every 10 years starting at age 91.  Prostate cancer screening. Recommendations will vary depending on your family history and other risks.  Hepatitis C blood test.  Hepatitis B blood test.  Sexually transmitted disease (STD) testing.  Diabetes screening. This is done by checking your blood sugar (glucose) after you have not eaten for a while (fasting). You may have this done every 1-3 years.  Abdominal aortic aneurysm (AAA) screening. You may need this if you are a current or former smoker.  Osteoporosis. You may be screened starting at age 46 if you are at high risk. Talk with your health care provider about your test results, treatment options, and if necessary, the need for more tests. Vaccines  Your health care provider may recommend certain vaccines, such as:  Influenza vaccine. This is recommended every year.  Tetanus, diphtheria, and acellular pertussis (Tdap, Td) vaccine. You may need a Td booster every  10 years.  Zoster vaccine. You may need this after age 76.  Pneumococcal 13-valent conjugate (PCV13) vaccine. One dose is recommended after age 82.  Pneumococcal polysaccharide (PPSV23) vaccine. One dose is recommended after age  87. Talk to your health care provider about which screenings and vaccines you need and how often you need them. This information is not intended to replace advice given to you by your health care provider. Make sure you discuss any questions you have with your health care provider. Document Released: 01/30/2015 Document Revised: 09/23/2015 Document Reviewed: 11/04/2014 Elsevier Interactive Patient Education  2017 [place] Prevention in the Home Falls can cause injuries. They can happen to people of all ages. There are many things you can do to make your home safe and to help prevent falls. What can I do on the outside of my home?  Regularly fix the edges of walkways and driveways and fix any cracks.  Remove anything that might make you trip as you walk through a door, such as a raised step or threshold.  Trim any bushes or trees on the path to your home.  Use bright outdoor lighting.  Clear any walking paths of anything that might make someone trip, such as rocks or tools.  Regularly check to see if handrails are loose or broken. Make sure that both sides of any steps have handrails.  Any raised decks and porches should have guardrails on the edges.  Have any leaves, snow, or ice cleared regularly.  Use sand or salt on walking paths during winter.  Clean up any spills in your garage right away. This includes oil or grease spills. What can I do in the bathroom?  Use night lights.  Install grab bars by the toilet and in the tub and shower. Do not use towel bars as grab bars.  Use non-skid mats or decals in the tub or shower.  If you need to sit down in the shower, use a plastic, non-slip stool.  Keep the floor dry. Clean up any water that spills on the floor as soon as it happens.  Remove soap buildup in the tub or shower regularly.  Attach bath mats securely with double-sided non-slip rug tape.  Do not have throw rugs and other things on the floor that can make  you trip. What can I do in the bedroom?  Use night lights.  Make sure that you have a light by your bed that is easy to reach.  Do not use any sheets or blankets that are too big for your bed. They should not hang down onto the floor.  Have a firm chair that has side arms. You can use this for support while you get dressed.  Do not have throw rugs and other things on the floor that can make you trip. What can I do in the kitchen?  Clean up any spills right away.  Avoid walking on wet floors.  Keep items that you use a lot in easy-to-reach places.  If you need to reach something above you, use a strong step stool that has a grab bar.  Keep electrical cords out of the way.  Do not use floor polish or wax that makes floors slippery. If you must use wax, use non-skid floor wax.  Do not have throw rugs and other things on the floor that can make you trip. What can I do with my stairs?  Do not leave any items on the stairs.  Make sure  that there are handrails on both sides of the stairs and use them. Fix handrails that are broken or loose. Make sure that handrails are as long as the stairways.  Check any carpeting to make sure that it is firmly attached to the stairs. Fix any carpet that is loose or worn.  Avoid having throw rugs at the top or bottom of the stairs. If you do have throw rugs, attach them to the floor with carpet tape.  Make sure that you have a light switch at the top of the stairs and the bottom of the stairs. If you do not have them, ask someone to add them for you. What else can I do to help prevent falls?  Wear shoes that:  Do not have high heels.  Have rubber bottoms.  Are comfortable and fit you well.  Are closed at the toe. Do not wear sandals.  If you use a stepladder:  Make sure that it is fully opened. Do not climb a closed stepladder.  Make sure that both sides of the stepladder are locked into place.  Ask someone to hold it for you, if  possible.  Clearly mark and make sure that you can see:  Any grab bars or handrails.  First and last steps.  Where the edge of each step is.  Use tools that help you move around (mobility aids) if they are needed. These include:  Canes.  Walkers.  Scooters.  Crutches.  Turn on the lights when you go into a dark area. Replace any light bulbs as soon as they burn out.  Set up your furniture so you have a clear path. Avoid moving your furniture around.  If any of your floors are uneven, fix them.  If there are any pets around you, be aware of where they are.  Review your medicines with your doctor. Some medicines can make you feel dizzy. This can increase your chance of falling. Ask your doctor what other things that you can do to help prevent falls. This information is not intended to replace advice given to you by your health care provider. Make sure you discuss any questions you have with your health care provider. Document Released: 10/30/2008 Document Revised: 06/11/2015 Document Reviewed: 02/07/2014 Elsevier Interactive Patient Education  2017 [employer].

## 2017-08-09 NOTE — Progress Notes (Signed)
Subjective:   Willian Donson [name] is a 77 y.o. male who presents for Medicare Annual/Subsequent preventive examination at [facility]  Last AWV-08/03/2016    Objective:    Vitals: BP 130/65 (BP Location: Right Arm, Patient Position: Supine)   Pulse 60   Temp (!) 97.5 F (36.4 C) (Oral)   Ht 5\' 4"  (1.626 m)   Wt 234 lb (106.1 kg)   BMI 40.17 kg/m   Body mass index is 40.17 kg/m.  Advanced Directives 08/09/2017 07/04/2017 05/15/2017 02/28/2017 01/31/2017 12/30/2016 12/05/2016  Does Patient Have a Medical Advance Directive? Yes Yes Yes Yes Yes Yes Yes  Type of Advance Directive Out of facility DNR (pink MOST or yellow form) Out of facility DNR (pink MOST or yellow form) Out of facility DNR (pink MOST or yellow form) Out of facility DNR (pink MOST or yellow form) Out of facility DNR (pink MOST or yellow form) Out of facility DNR (pink MOST or yellow form) Out of facility DNR (pink MOST or yellow form)  Does patient want to make changes to medical advance directive? No - Patient declined No - Patient declined No - Patient declined No - Patient declined No - Patient declined - -  Copy of [place] in [place]  Would patient like information on creating a medical advance directive? - - - - - - -  Pre-existing out of facility DNR order (yellow form or pink MOST form) Yellow form placed in chart (order not valid for inpatient use) Yellow form placed in chart (order not valid for inpatient use) Yellow form placed in chart (order not valid for inpatient use) Yellow form placed in chart (order not valid for inpatient use) Yellow form placed in chart (order not valid for inpatient use) Yellow form placed in chart (order not valid for inpatient use) Yellow form placed in chart (order not valid for inpatient use)    Tobacco Social History   Tobacco Use  Smoking Status Former Smoker  . Packs/day: 2.00  . Years: 50.00  . Pack years: 100.00  . Last attempt to  quit: 08/18/2014  . Years since quitting: 2.9  Smokeless Tobacco Former Engineer, structural given: Not Answered   Clinical Intake:  Pre-visit preparation completed: No  Pain : No/denies pain     Nutritional Risks: None Diabetes: Yes CBG done?: No Did pt. bring in CBG monitor from home?: No  How often do you need to have someone help you when you read instructions, pamphlets, or other written materials from your doctor or pharmacy?: 2 - Rarely  Interpreter Needed?: No  Information entered by :: Tyson Dense, RN  Past Medical History:  Diagnosis Date  . 2nd degree AV block    a. s/p STJ dual chamber pacemaker 11/2014  . Acute respiratory failure ([facility]) 01/26/2015  . AKI (acute kidney injury) ([place]) 01/26/2015  . AMI, INFERIOR WALL 09/30/2008   Qualifier: Diagnosis of  By: Unk Lightning, RN, BSN, Melanie    . Benign neoplasm of colon     Adenomatous polyps  . Benign prostatic hyperplasia   . Bipolar affective disorder ([place])    . Bipolar depression ([place]) 05/10/2015  . BPH (benign prostatic hypertrophy)   . CAD (coronary artery disease)    a. s/p CABG  . CAD, ARTERY BYPASS GRAFT 11/18/2008   Qualifier: Diagnosis of  By: Angelena Form, MD, Harrell Gave    . Chronic pain    .  CVA (cerebral infarction)     Small left occipital  . Dementia with behavioral disturbance 02/15/2015  . Depression    . Diabetic peripheral neuropathy ([place]) 09/28/2011  . DM        . DM (diabetes mellitus), type 2 with peripheral vascular complications ([place]) 0/99/8338   Qualifier: Diagnosis of  By: Burnett Kanaris    . Gout    . History of alcohol abuse    . Hyperlipidemia    . HYPERTENSION        . Hypertensive heart disease with heart failure ([place]) 09/01/2008   Qualifier: Diagnosis of  By: Burnett Kanaris    . Insomnia 04/19/2012  . Ischemic cardiomyopathy    . Narcolepsy    . Obesity   . OBESITY 09/01/2008   Qualifier: Diagnosis of  By: Burnett Kanaris    . OBSTRUCTIVE SLEEP APNEA        . Obstructive  sleep apnea 09/16/2008   Qualifier: Diagnosis of  By: [name] MD, [name]   . Pacemaker 12/12/2014  . Parkinsonism ([place]) 09/30/2015  . [facility] spotted fever   . SINUS TACHYCARDIA 09/02/2008   Qualifier: Diagnosis of  By: Caryl Comes, MD, [name]   . TIA (transient ischemic attack)    . TOBACCO ABUSE 07/31/2007   Qualifier: Diagnosis of  By: [name] MD, [name]   . Tremor 08/11/2015  . UNIVERSAL ULCERATIVE COLITIS        . Vitamin D deficiency 01/07/2015   Past Surgical History:  Procedure Laterality Date  . CARDIAC CATHETERIZATION     Triple-vessel coronary artery disease. Low-normal left ventricular systolic function with mild   anteroapical wall motion abnormality.   [name] Kitchen CARDIAC CATHETERIZATION N/A 01/28/2015   Procedure: Temporary Pacemaker;  Surgeon: [name], MD;  Location: [facility];  Service: Cardiovascular;  Laterality: N/A;  . CORONARY ARTERY BYPASS GRAFT     Median sternotomy, extracorporeal circulation,  coronary artery bypass graft surgery x4 using a sequential left internal   mammary artery graft to the mid and distal left anterior descending, a   saphenous vein graft to diagonal branch of the left anterior descending,   and a saphenous vein graft to the obtuse marginal branch of left   circumflex coronary artery.    . EP IMPLANTABLE DEVICE N/A 12/12/2014   Procedure: Pacemaker Implant;  Surgeon: [name], MD;  Location: [facility];  Service: Cardiovascular;  Laterality: N/A;  . EP IMPLANTABLE DEVICE N/A 01/28/2015   Procedure: PPM Generator Removal;  Surgeon: [name], MD;  Location: [facility];  Service: Cardiovascular;  Laterality: N/A;  . EP IMPLANTABLE DEVICE N/A 03/20/2015   Procedure: Pacemaker Implant;  Surgeon: [name], MD;  Location: [facility];  Service: Cardiovascular;  Laterality: N/A;  . FLEXIBLE SIGMOIDOSCOPY  01/17/2012   Procedure: FLEXIBLE SIGMOIDOSCOPY;  Surgeon: Lafayette Dragon, MD;  Location: [facility];  Service: Endoscopy;  Laterality: N/A;  . MULTIPLE EXTRACTIONS WITH ALVEOLOPLASTY  12/01/2011   Procedure: MULTIPLE EXTRACION WITH ALVEOLOPLASTY;  Surgeon: Lenn Cal, DDS;  Location: [facility];  Service: Oral Surgery;  Laterality: N/A;  Extraction of tooth #'s 3,4,5,6,7,8,9,10,11,12,13,14,15,16,17,20,21,22,23,24,25,26,27,28,29,30,31,32 with alveoloplasty and bilateral mandibular lingual tori  . TONSILECTOMY, ADENOIDECTOMY, BILATERAL MYRINGOTOMY AND TUBES  1946   Family History  Problem Relation Age of Onset  . Diabetes Father   . Heart disease Father   . Stroke Father   . Heart attack Mother   .  Aortic aneurysm Mother   . Alcohol abuse Other   . Arthritis Other   . Heart disease Other   . Mental illness Other   . Diabetes Other   . Alcohol abuse Other   . Arthritis Other   . Hyperlipidemia Other   . Heart disease Other   . Stroke Other   . Hypertension Other   . Diabetes Other   . Mental illness Other   . Heart disease Brother    Social History   Socioeconomic History  . Marital status: Married    Spouse name: [name]  . Number of children: 2  . Years of education: 40  . Highest education level: Not on file  Occupational History  . 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 Financial resource strain: Not hard at all  . Food insecurity:    Worry: Never true    Inability: Never true  . Transportation needs:    Medical: No    Non-medical: No  Tobacco Use  . Smoking status: Former Smoker    Packs/day: 2.00    Years: 50.00    Pack years: 100.00    Last attempt to quit: 08/18/2014    Years since quitting: 2.9  . Smokeless tobacco: Former Network engineer and Sexual Activity  . Alcohol use: No    Comment: quit 2003  . Drug use: No  . Sexual activity: Never  Lifestyle  . Physical activity:    Days per week: 0 days    Minutes per session: 0 min  . Stress: To some extent  Relationships  . Social  connections:    Talks on phone: Once a week    Gets together: Once a week    Attends religious service: Never    Active member of club or organization: No    Attends meetings of clubs or organizations: Never    Relationship status: Married  Other Topics Concern  . Not on file  Social History Narrative   Patient has been married for 51 years.- [name]   Patient presents with his wife today.    Patient has 2 grown children and lives in [place] areas respectively.   Patient is a nonsmoker, stopped smoking 2016    nondrinker at this time, quit 2003   09/11/15 lives at [facility] since 09/02/14   Caffeine- 1 soda daily   DNR    Outpatient Encounter Medications as of 08/09/2017  Medication Sig  . aspirin 81 MG chewable tablet Chew 81 mg by mouth at bedtime.   . carbidopa-levodopa (SINEMET IR) 25-100 MG tablet Take 2 tablets by mouth 3 (three) times daily with meals.   . carvedilol (COREG) 3.125 MG tablet Take 3.125 mg by mouth 2 (two) times daily with a meal. Hold for SBP less than 105   . Cholecalciferol (VITAMIN D3) 50000 units CAPS Take 1 capsule by mouth once a week.  . clonazePAM (KLONOPIN) 0.5 MG tablet Take 0.5 mg by mouth at bedtime.  . donepezil (ARICEPT) 10 MG tablet Take 10 mg by mouth at bedtime.  [name] Kitchen doxazosin (CARDURA) 1 MG tablet Take 1 mg by mouth at bedtime.  . fenofibrate (TRICOR) 145 MG tablet Take 145 mg by mouth daily. With a meal  . furosemide (LASIX) 40 MG tablet Take 40 mg by mouth daily.  [name] Kitchen gabapentin (NEURONTIN) 400 MG capsule Take 400 mg by mouth 3 (three) times  daily.   . Insulin Glargine (TOUJEO MAX SOLOSTAR) 300 UNIT/ML SOPN Inject 50 Units into the skin at bedtime.   . insulin lispro (HUMALOG KWIKPEN) 100 UNIT/ML KiwkPen Inject 13 Units into the skin 3 (three) times daily. Hold for CBG  < 200. ( to be given in addition to sliding scale) Check FSBS before meals and at bedtime. SSI 201-25 4 units, 251-300 6 units, 301-350 8 units,  351-400 10 units  . lamoTRIgine (LAMICTAL) 100 MG tablet Take 100 mg by mouth daily. Give 100 mg tablet along with 25 mg tablet to equal 125 mg total  . lamoTRIgine (LAMICTAL) 25 MG tablet Take 25 mg by mouth daily. give one tab with Lamictal 100 mg to equal 125 mg daily  . lidocaine (LIDODERM) 5 % Place 1 patch onto the skin. Apply 1 patch to lower back on in AM. Remove patch from lower back in PM Remove & Discard patch within 12 hours or as directed by MD  . losartan (COZAAR) 25 MG tablet Take 25 mg by mouth every morning.  . magnesium oxide (MAG-OX) 400 MG tablet Take 400 mg by mouth daily.   . metFORMIN (GLUCOPHAGE) 1000 MG tablet Take 1,000 mg by mouth 2 (two) times daily.   . Multiple Vitamin (MULTIVITAMIN) tablet Take 1 tablet by mouth daily.  . nitroGLYCERIN (NITROSTAT) 0.4 MG SL tablet Place 0.4 mg under the tongue every 5 (five) minutes as needed for chest pain.  . Omega-3 Fatty Acids (FISH OIL) 1000 MG CAPS Take 1 capsule by mouth 2 (two) times daily.   . OXYGEN Inhale 2 L into the lungs continuous. To keep spo2 above 90%  . polyethylene glycol (MIRALAX / GLYCOLAX) packet Take 17 g by mouth daily.   . pravastatin (PRAVACHOL) 40 MG tablet Take 40 mg by mouth at bedtime.   . traMADol (ULTRAM) 50 MG tablet Take 50 mg by mouth as directed. Every shift for pain  . traZODone (DESYREL) 50 MG tablet Take 50 mg by mouth at bedtime.   No facility-administered encounter medications on file as of 08/09/2017.     Activities of Daily Living In your present state of health, do you have any difficulty performing the following activities: 08/09/2017  Hearing? N  Vision? N  Difficulty concentrating or making decisions? Y  Walking or climbing stairs? Y  Dressing or bathing? Y  Doing errands, shopping? Y  Preparing Food and eating ? Y  Using the Toilet? Y  In the past six months, have you accidently leaked urine? Y  Do you have problems with loss of bowel control? Y  Managing your Medications? Y   Managing your Finances? Y  Housekeeping or managing your Housekeeping? Y  Some recent data might be hidden    Patient Care Team: [name], MD as PCP - General (Internal Medicine) [name], MD as Consulting Physician (Cardiology) [name], MD as Consulting Physician (Neurology)   Assessment:   This is a routine wellness examination for Kendre.  Exercise Activities and Dietary recommendations Current Exercise Habits: The patient does not participate in regular exercise at present, Exercise limited by: orthopedic condition(s)  Goals    None      Fall Risk Fall Risk  08/09/2017 08/03/2016 12/14/2015 09/11/2015 02/24/2015  Falls in the past year? Yes No Yes No No  Number falls in past yr: 1 - 2 or more - -  Injury with Fall? No - No - -  Risk for fall due to : - -  Other (Comment) - -  Risk for fall due to: Comment - - slid out of wheelchair - -   Is the patient's home free of loose throw rugs in walkways, pet beds, electrical cords, etc?   yes      Grab bars in the bathroom? yes      Handrails on the stairs?   yes      Adequate lighting?   yes  Timed Get Up and Go Performed: patient is unambulatory  Depression Screen PHQ 2/9 Scores 08/09/2017 08/03/2016 02/24/2015  PHQ - 2 Score 0 0 1    Cognitive Function     6CIT Screen 08/09/2017 08/03/2016  What Year? 0 points 0 points  What month? 3 points 0 points  What time? 0 points 0 points  Count back from 20 0 points 0 points  Months in reverse 4 points 4 points  Repeat phrase 4 points 4 points  Total Score 11 8    Immunization History  Administered Date(s) Administered  . Influenza-Unspecified 10/28/2016  . PPD Test 03/20/2013  . Pneumococcal Conjugate-13 10/09/2015  . Pneumococcal Polysaccharide-23 09/02/2014    Qualifies for Shingles Vaccine? Not in past records  Screening Tests Health Maintenance  Topic Date Due  . OPHTHALMOLOGY EXAM  10/01/2017 (Originally 03/15/2017)  . FOOT EXAM   06/10/2018 (Originally 04/06/2017)  . TETANUS/TDAP  01/18/2023 (Originally 11/24/1959)  . HEMOGLOBIN A1C  08/11/2017  . INFLUENZA VACCINE  08/17/2017  . PNA vac Low Risk Adult  Completed   Cancer Screenings: Lung: Low Dose CT Chest recommended if Age 39-80 years, 30 pack-year currently smoking OR have quit w/in 15years. Patient does not qualify. Colorectal: up to date  Additional Screenings:  Hepatitis C Screening:declined  TDAP due: ordered Diabetic eye exam due: ordered    Plan:    I have personally reviewed and addressed the Medicare Annual Wellness questionnaire and have noted the following in the patient's chart:  A. Medical and social history B. Use of alcohol, tobacco or illicit drugs  C. Current medications and supplements D. Functional ability and status E.  Nutritional status F.  Physical activity G. Advance directives H. List of other physicians I.  Hospitalizations, surgeries, and ER visits in previous 12 months J.  [place] to include hearing, vision, cognitive, depression L. Referrals and appointments - none  In addition, I have reviewed and discussed with patient certain preventive protocols, quality metrics, and best practice recommendations. A written personalized care plan for preventive services as well as general preventive health recommendations were provided to patient.  See attached scanned questionnaire for additional information.   Signed,   Tyson Dense, RN Nurse Health Advisor  Patient Concerns: None

## 2017-08-16 LAB — CUP PACEART REMOTE DEVICE CHECK
Battery Remaining Percentage: 95.5 %
Battery Voltage: 3.01 V
Brady Statistic RV Percent Paced: 99 %
Date Time Interrogation Session: 20190703181122
Implantable Lead Implant Date: 20170303
Implantable Lead Location: 753860
Lead Channel Impedance Value: 430 Ohm
Lead Channel Pacing Threshold Amplitude: 1 V
Lead Channel Pacing Threshold Pulse Width: 0.5 ms
Lead Channel Sensing Intrinsic Amplitude: 5 mV
MDC IDC LEAD IMPLANT DT: 20170303
MDC IDC LEAD LOCATION: 753859
MDC IDC MSMT BATTERY REMAINING LONGEVITY: 118 mo
MDC IDC MSMT LEADCHNL RV IMPEDANCE VALUE: 450 Ohm
MDC IDC MSMT LEADCHNL RV PACING THRESHOLD AMPLITUDE: 0.625 V
MDC IDC MSMT LEADCHNL RV PACING THRESHOLD PULSEWIDTH: 0.5 ms
MDC IDC PG IMPLANT DT: 20170303
MDC IDC SET LEADCHNL RA PACING AMPLITUDE: 2.5 V
MDC IDC SET LEADCHNL RV PACING AMPLITUDE: 0.875
MDC IDC SET LEADCHNL RV PACING PULSEWIDTH: 0.5 ms
MDC IDC SET LEADCHNL RV SENSING SENSITIVITY: 4 mV
MDC IDC STAT BRADY AP VP PERCENT: 31 %
MDC IDC STAT BRADY AP VS PERCENT: 1 %
MDC IDC STAT BRADY AS VP PERCENT: 69 %
MDC IDC STAT BRADY AS VS PERCENT: 1 %
MDC IDC STAT BRADY RA PERCENT PACED: 29 %
Pulse Gen Model: 2240
Pulse Gen Serial Number: 7888922

## 2017-08-25 ENCOUNTER — Encounter: Payer: Self-pay | Admitting: Internal Medicine

## 2017-08-25 NOTE — Assessment & Plan Note (Signed)
No reported wounds; continue ASA 81 mg daily patient with poorly controlled diabetes but last A1c was 8.0 which is really good for this noncompliant patient; patient is on statin TriCor and fish oil

## 2017-08-25 NOTE — Assessment & Plan Note (Signed)
Appears controlled on trazodone 50 mg nightly; continue current medication

## 2017-08-25 NOTE — Assessment & Plan Note (Signed)
A1c is 8 which is really really good with this noncompliant patient; patient is on statin and ARB: Continue Glucophage 1000 mg twice daily, Toujeo 50 units at bedtime and 13 units Humalog with every meal

## 2017-09-05 ENCOUNTER — Encounter: Payer: Self-pay | Admitting: Internal Medicine

## 2017-09-05 ENCOUNTER — Non-Acute Institutional Stay (SKILLED_NURSING_FACILITY): Payer: Medicare Other | Admitting: Internal Medicine

## 2017-09-05 DIAGNOSIS — F3175 Bipolar disorder, in partial remission, most recent episode depressed: Secondary | ICD-10-CM | POA: Diagnosis not present

## 2017-09-05 DIAGNOSIS — G300 Alzheimer's disease with early onset: Secondary | ICD-10-CM | POA: Diagnosis not present

## 2017-09-05 DIAGNOSIS — N400 Enlarged prostate without lower urinary tract symptoms: Secondary | ICD-10-CM

## 2017-09-05 DIAGNOSIS — F0281 Dementia in other diseases classified elsewhere with behavioral disturbance: Secondary | ICD-10-CM | POA: Diagnosis not present

## 2017-09-05 NOTE — Progress Notes (Signed)
Location:  [facility] Room Number: [phone] Place of Service:  SNF [phone])  [name]. [name], MD  Patient Care Team: [name], MD as PCP - General (Internal Medicine) [name], MD as Consulting Physician (Cardiology) [name], MD as Consulting Physician (Neurology)  Extended Emergency Contact Information Primary Emergency Contact: [name],[name] Address: [address]          [place], [place] [address] [name] of [place] Phone: [phone] Mobile Phone: [phone] Relation: Spouse Secondary Emergency Contact: [name], [name] Phone: [phone] Mobile Phone: [phone] Relation: Daughter    Allergies: Benzedrine [amphetamine]; Dexedrine [dextroamphetamine sulfate er]; Oxycodone; and Vancomycin  Chief Complaint  Patient presents with  . Medical Management of Chronic Issues    Routine Visit  . Health Maintenance    Hmg A1C    HPI: Patient is 77 y.o. male who is being seen for routine issues of dementia, bipolar disorder and BPH.  Past Medical History:  Diagnosis Date  . 2nd degree AV block    a. s/p STJ dual chamber pacemaker 11/2014  . Acute respiratory failure ([place]) 01/26/2015  . AKI (acute kidney injury) ([place]) 01/26/2015  . AMI, INFERIOR WALL 09/30/2008   Qualifier: Diagnosis of  By: Unk Lightning, RN, BSN, Melanie    . Benign neoplasm of colon     Adenomatous polyps  . Benign prostatic hyperplasia   . Bipolar affective disorder ([place])    . Bipolar depression ([place]) 05/10/2015  . BPH (benign prostatic hypertrophy)   . CAD (coronary artery disease)    a. s/p CABG  . CAD, ARTERY BYPASS GRAFT 11/18/2008   Qualifier: Diagnosis of  By: Angelena Form, MD, Harrell Gave    . Chronic pain    . CVA (cerebral infarction)     Small left occipital  . Dementia with behavioral disturbance 02/15/2015  . Depression    . Diabetic peripheral neuropathy ([place]) 09/28/2011  . DM         . DM (diabetes mellitus), type 2 with peripheral vascular complications ([place]) 8/88/9169   Qualifier: Diagnosis of  By: Burnett Kanaris    . Gout    . History of alcohol abuse    . Hyperlipidemia    . HYPERTENSION        . Hypertensive heart disease with heart failure ([place]) 09/01/2008   Qualifier: Diagnosis of  By: Burnett Kanaris    . Insomnia 04/19/2012  . Ischemic cardiomyopathy    . Narcolepsy    . Obesity   . OBESITY 09/01/2008   Qualifier: Diagnosis of  By: Burnett Kanaris    . OBSTRUCTIVE SLEEP APNEA        . Obstructive sleep apnea 09/16/2008   Qualifier: Diagnosis of  By: [name] MD, [name]   . Pacemaker 12/12/2014  . Parkinsonism ([place]) 09/30/2015  . [facility] spotted fever   . SINUS TACHYCARDIA 09/02/2008   Qualifier: Diagnosis of  By: Caryl Comes, MD, [name]   . TIA (transient ischemic attack)    . TOBACCO ABUSE 07/31/2007   Qualifier: Diagnosis of  By: [name] MD, [name]   . Tremor 08/11/2015  . UNIVERSAL ULCERATIVE COLITIS        . Vitamin D deficiency 01/07/2015    Past Surgical History:  Procedure Laterality Date  . CARDIAC CATHETERIZATION     Triple-vessel coronary artery disease. Low-normal left ventricular systolic function with mild  anteroapical wall motion abnormality.   [name] Kitchen CARDIAC CATHETERIZATION N/A 01/28/2015   Procedure: Temporary Pacemaker;  Surgeon: [name], MD;  Location: [facility];  Service: Cardiovascular;  Laterality: N/A;  . CORONARY ARTERY BYPASS GRAFT     Median sternotomy, extracorporeal circulation,  coronary artery bypass graft surgery x4 using a sequential left internal   mammary artery graft to the mid and distal left anterior descending, a   saphenous vein graft to diagonal branch of the left anterior descending,   and a saphenous vein graft to the obtuse marginal branch of left   circumflex coronary artery.    . EP IMPLANTABLE DEVICE N/A 12/12/2014   Procedure: Pacemaker Implant;  Surgeon: [name], MD;  Location: [facility];  Service: Cardiovascular;  Laterality: N/A;  . EP IMPLANTABLE DEVICE N/A 01/28/2015   Procedure: PPM Generator Removal;  Surgeon: [name], MD;  Location: [facility];  Service: Cardiovascular;  Laterality: N/A;  . EP IMPLANTABLE DEVICE N/A 03/20/2015   Procedure: Pacemaker Implant;  Surgeon: [name], MD;  Location: [facility];  Service: Cardiovascular;  Laterality: N/A;  . FLEXIBLE SIGMOIDOSCOPY  01/17/2012   Procedure: FLEXIBLE SIGMOIDOSCOPY;  Surgeon: Lafayette Dragon, MD;  Location: [facility];  Service: Endoscopy;  Laterality: N/A;  . MULTIPLE EXTRACTIONS WITH ALVEOLOPLASTY  12/01/2011   Procedure: MULTIPLE EXTRACION WITH ALVEOLOPLASTY;  Surgeon: Lenn Cal, DDS;  Location: [facility];  Service: Oral Surgery;  Laterality: N/A;  Extraction of tooth #'s 3,4,5,6,7,8,9,10,11,12,13,14,15,16,17,20,21,22,23,24,25,26,27,28,29,30,31,32 with alveoloplasty and bilateral mandibular lingual tori  . TONSILECTOMY, ADENOIDECTOMY, BILATERAL MYRINGOTOMY AND TUBES  1948    Allergies as of 09/05/2017      Reactions   Benzedrine [amphetamine] Nausea Only   Dexedrine [dextroamphetamine Sulfate Er] Other (See Comments)   agitation   Oxycodone Nausea And Vomiting   Vancomycin    Unknown - per [facility]      Medication List        Accurate as of 09/05/17 11:59 PM. Always use your most recent med list.          aspirin 81 MG chewable tablet Chew 81 mg by mouth at bedtime.   carbidopa-levodopa 25-100 MG tablet Commonly known as:  SINEMET IR Take 2 tablets by mouth 3 (three) times daily with meals.   carvedilol 3.125 MG tablet Commonly known as:  COREG Take 3.125 mg by mouth 2 (two) times daily with a meal. Hold for SBP less than 105   clonazePAM 0.5 MG tablet Commonly known as:  KLONOPIN Take 0.5 mg by mouth at bedtime.   donepezil 10 MG tablet Commonly known as:  ARICEPT Take 10 mg by mouth at bedtime.   doxazosin 1 MG  tablet Commonly known as:  CARDURA Take 1 mg by mouth at bedtime.   fenofibrate 145 MG tablet Commonly known as:  TRICOR Take 145 mg by mouth daily. With a meal   Fish Oil 1000 MG Caps Take 1 capsule by mouth 2 (two) times daily.   furosemide 40 MG tablet Commonly known as:  LASIX Take 40 mg by mouth daily.   gabapentin 400 MG capsule Commonly known as:  NEURONTIN Take 400 mg by mouth 3 (three) times daily.   HUMALOG KWIKPEN 100 UNIT/ML KiwkPen Generic drug:  insulin lispro Inject 13 Units into the skin 3 (three) times daily. Hold for CBG  < 200. ( to be given in addition to sliding scale) Check FSBS before meals and at  bedtime. SSI 201-25 4 units, 251-300 6 units, 301-350 8 units, 351-400 10 units   lamoTRIgine 25 MG tablet Commonly known as:  LAMICTAL Take 25 mg by mouth daily. give one tab with Lamictal 100 mg to equal 125 mg daily   lamoTRIgine 100 MG tablet Commonly known as:  LAMICTAL Take 100 mg by mouth daily. Give 100 mg tablet along with 25 mg tablet to equal 125 mg total   lidocaine 5 % Commonly known as:  LIDODERM Place 1 patch onto the skin. Apply 1 patch to lower back on in AM. Remove patch from lower back in PM Remove & Discard patch within 12 hours or as directed by MD   losartan 25 MG tablet Commonly known as:  COZAAR Take 25 mg by mouth every morning.   Magnesium 250 MG Tabs Take by mouth. TAKE 2 TABLETS (500 MG) BY MOUTH DAILY   metFORMIN 1000 MG tablet Commonly known as:  GLUCOPHAGE Take 1,000 mg by mouth 2 (two) times daily.   multivitamin tablet Take 1 tablet by mouth daily.   nitroGLYCERIN 0.4 MG SL tablet Commonly known as:  NITROSTAT Place 0.4 mg under the tongue every 5 (five) minutes as needed for chest pain.   OXYGEN Inhale 2 L into the lungs continuous. To keep spo2 above 90%   PHENERGAN PO Take by mouth. Phenergan tab 25mg  p.o. every 6 hours PRNX 3 doses. Notify MD if symptoms persist for more than 24 hours. (Physician Order)    polyethylene glycol packet Commonly known as:  MIRALAX / GLYCOLAX Take 17 g by mouth daily.   pravastatin 40 MG tablet Commonly known as:  PRAVACHOL Take 40 mg by mouth at bedtime.   TOUJEO MAX SOLOSTAR 300 UNIT/ML Sopn Generic drug:  Insulin Glargine Inject 50 Units into the skin at bedtime.   traMADol 50 MG tablet Commonly known as:  ULTRAM Take 50 mg by mouth as directed. Every shift for pain   traZODone 50 MG tablet Commonly known as:  DESYREL Take 50 mg by mouth at bedtime.   Vitamin D3 50000 units Caps Take 1 capsule by mouth once a week.       No orders of the defined types were placed in this encounter.   Immunization History  Administered Date(s) Administered  . Influenza-Unspecified 10/28/2016  . PPD Test 03/20/2013  . Pneumococcal Conjugate-13 10/09/2015  . Pneumococcal Polysaccharide-23 09/02/2014    Social History   Tobacco Use  . Smoking status: Former Smoker    Packs/day: 2.00    Years: 50.00    Pack years: 100.00    Last attempt to quit: 08/18/2014    Years since quitting: 3.0  . Smokeless tobacco: Former Network engineer Use Topics  . Alcohol use: No    Comment: quit 2003    Review of Systems  DATA OBTAINED: from patient-limited; nursing- no acute concerns GENERAL:  no fevers, fatigue, appetite changes SKIN: No itching, rash HEENT: No complaint RESPIRATORY: No cough, wheezing, SOB CARDIAC: No chest pain, palpitations, lower extremity edema  GI: No abdominal pain, No N/V/D or constipation, No heartburn or reflux  GU: No dysuria, frequency or urgency, or incontinence  MUSCULOSKELETAL: No unrelieved bone/joint pain NEUROLOGIC: No headache, dizziness  PSYCHIATRIC: No overt anxiety or sadness  Vitals:   09/05/17 1407  BP: 116/67  Pulse: 80  Resp: (!) 22  Temp: 98.3 F (36.8 C)   Body mass index is 40.75 kg/m. Physical Exam  GENERAL APPEARANCE: Alert, minimally to moderately conversant, No  acute distress  SKIN: No diaphoresis  rash HEENT: Unremarkable RESPIRATORY: Breathing is even, unlabored. Lung sounds are clear   CARDIOVASCULAR: Heart RRR no murmurs, rubs or gallops. No peripheral edema  GASTROINTESTINAL: Abdomen is soft, non-tender, not distended w/ normal bowel sounds.  GENITOURINARY: Bladder non tender, not distended  MUSCULOSKELETAL: No abnormal joints or musculature NEUROLOGIC: Cranial nerves 2-12 grossly intact. Moves all extremities-movements are very slow PSYCHIATRIC: Mood and affect at, no behavioral issues  Patient Active Problem List   Diagnosis Date Noted  . Peripheral vascular disease ([place]) 02/04/2017  . Controlled diabetes mellitus type 2 with complications ([place]) 26/94/8546  . Aspiration pneumonia ([place]) 09/30/2015  . Parkinsonism ([place]) 09/30/2015  . Tremor 08/11/2015  . Suicidal ideation 05/10/2015  . Bipolar depression ([place]) 05/10/2015  . Complete heart block ([place]) 03/20/2015  . Bradycardia 03/14/2015  . Dementia with behavioral disturbance 02/15/2015  . Staphylococcus aureus bacteremia with sepsis ([place]) 01/28/2015  . Infection of pacemaker pocket ([place]) 01/28/2015  . Acute respiratory failure ([place]) 01/26/2015  . AKI (acute kidney injury) ([place]) 01/26/2015  . Acute encephalopathy 01/26/2015  . Altered mental status   . Vitamin D deficiency 01/07/2015  . Mobitz type 2 second degree atrioventricular block 12/12/2014  . Pacemaker 12/12/2014  . Symptomatic bradycardia 12/03/2014  . Lower back pain 06/12/2012  . Right hip pain 06/12/2012  . Intertrigo 05/09/2012  . Insomnia 04/19/2012  . General weakness 03/07/2012  . 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 Diarrhea 01/17/2012  . Pyelonephritis 01/15/2012  . Diabetic peripheral neuropathy ([place]) 09/28/2011  . Urinary incontinence 09/28/2011  . Preventative health care 09/23/2011  . Gout 09/23/2011  . Bipolar affective disorder ([place]) 09/23/2011  . TIA (transient ischemic attack) 09/23/2011  . Chronic pain  09/23/2011  . DNR (do not resuscitate) 09/23/2011  . CVA (cerebral infarction) 09/23/2011  . 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 History of alcohol abuse 09/23/2011  . Benign prostatic hyperplasia   . Left ventricular systolic dysfunction   . Chronic systolic CHF (congestive heart failure) ([place]) 05/05/2011  . Edema 01/03/2011  . CAD (coronary artery disease) 05/18/2010  . Hyperlipidemia 05/18/2010  . Other abnormality of urination(788.69) 11/25/2009  . Other specified forms of chronic ischemic heart disease 05/19/2009  . CAD, ARTERY BYPASS GRAFT 11/18/2008  . AMI, INFERIOR WALL 09/30/2008  . Obstructive sleep apnea 09/16/2008  . SINUS TACHYCARDIA 09/02/2008  . DM (diabetes mellitus), type 2 with peripheral vascular complications ([place]) 27/03/5007  . OBESITY 09/01/2008  . Hypertensive heart disease with heart failure ([place]) 09/01/2008  . TOBACCO ABUSE 07/31/2007  . 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    CMP     Component Value Date/Time   NA 142 02/11/2017   K 4.3 02/11/2017   CL 102 03/20/2015 1038   CO2 28 03/20/2015 1038   GLUCOSE 171 (H) 03/20/2015 1038   BUN 35 (A) 02/11/2017   CREATININE 2.2 (A) 02/11/2017   CREATININE 1.23 03/20/2015 1038   CREATININE 1.06 12/05/2014 1536   CALCIUM 9.6 03/20/2015 1038   PROT 5.8 (L) 02/01/2015 0245   ALBUMIN 2.2 (L) 02/01/2015 0245   AST 17 02/11/2017   ALT 6 (A) 02/11/2017   ALKPHOS 21 (A) 02/11/2017   BILITOT 0.4 02/01/2015 0245   GFRNONAA 55 (L) 03/20/2015 1038   GFRAA >60 03/20/2015 1038   Recent Labs    02/11/17  NA 142  K 4.3  BUN 35*  CREATININE 2.2*   Recent Labs    02/11/17  AST 17  ALT 6*  ALKPHOS 21*  Recent Labs    02/11/17  WBC 6.3  HGB 11.2*  HCT 31*  PLT 243   Recent Labs    02/11/17  CHOL 154  LDLCALC 59  TRIG 295*   Lab Results  Component Value Date   MICROALBUR >42.6 04/09/2016   Lab Results  Component Value Date   TSH 1.89 02/11/2017   Lab Results  Component Value Date   HGBA1C 8  02/11/2017   Lab Results  Component Value Date   CHOL 154 02/11/2017   HDL 36 02/11/2017   LDLCALC 59 02/11/2017   LDLDIRECT 85.2 06/13/2012   TRIG 295 (A) 02/11/2017   CHOLHDL 7 06/13/2012    Significant Diagnostic Results in last 30 days:  No results found.  Assessment and Plan  Dementia with behavioral disturbance  slow but steady decline; continue Aricept 10 mg daily  Bipolar affective disorder (HCC) Mood appears stable; continue Lamictal 125 mg daily  Benign prostatic hyperplasia No reported infections or obstructions; continue Cardura 1 mg daily     Maui Ahart D. [name], MD

## 2017-09-09 ENCOUNTER — Encounter: Payer: Self-pay | Admitting: Internal Medicine

## 2017-09-09 NOTE — Assessment & Plan Note (Signed)
Mood appears stable; continue Lamictal 125 mg daily

## 2017-09-09 NOTE — Assessment & Plan Note (Signed)
No reported infections or obstructions; continue Cardura 1 mg daily

## 2017-09-09 NOTE — Assessment & Plan Note (Signed)
slow but steady decline; continue Aricept 10 mg daily

## 2017-10-02 ENCOUNTER — Encounter: Payer: Self-pay | Admitting: Internal Medicine

## 2017-10-02 ENCOUNTER — Non-Acute Institutional Stay (SKILLED_NURSING_FACILITY): Payer: Medicare Other | Admitting: Internal Medicine

## 2017-10-02 DIAGNOSIS — E782 Mixed hyperlipidemia: Secondary | ICD-10-CM

## 2017-10-02 DIAGNOSIS — I110 Hypertensive heart disease with heart failure: Secondary | ICD-10-CM

## 2017-10-02 DIAGNOSIS — E1151 Type 2 diabetes mellitus with diabetic peripheral angiopathy without gangrene: Secondary | ICD-10-CM

## 2017-10-02 NOTE — Progress Notes (Signed)
Location:  [facility] Room Number: [phone] Place of Service:  SNF [phone])  [name]. Victor Coil, MD  Patient Care Team: [name], MD as PCP - General (Internal Medicine) [name], MD as Consulting Physician (Cardiology) [name], MD as Consulting Physician (Neurology)  Extended Emergency Contact Information Primary Emergency Contact: [name],[name] Address: [address]          [place], [place] [address] [name] of [place] Phone: [phone] Mobile Phone: [phone] Relation: Spouse Secondary Emergency Contact: [name],  [name] of [place] Phone: [phone] Mobile Phone: [phone] Relation: Daughter    Allergies: Benzedrine [amphetamine]; Dexedrine [dextroamphetamine sulfate er]; Oxycodone; and Vancomycin  Chief Complaint  Patient presents with  . Medical Management of Chronic Issues    Routine Visit    HPI: Patient is 77 y.o. male who is being seen for routine issues of diabetes mellitus type 2, hypertension, and hyperlipidemia.  Past Medical History:  Diagnosis Date  . 2nd degree AV block    a. s/p STJ dual chamber pacemaker 11/2014  . Acute respiratory failure ([place]) 01/26/2015  . AKI (acute kidney injury) ([place]) 01/26/2015  . AMI, INFERIOR WALL 09/30/2008   Qualifier: Diagnosis of  By: Unk Lightning, RN, BSN, Melanie    . Benign neoplasm of colon     Adenomatous polyps  . Benign prostatic hyperplasia   . Bipolar affective disorder ([place])    . Bipolar depression ([place]) 05/10/2015  . BPH (benign prostatic hypertrophy)   . CAD (coronary artery disease)    a. s/p CABG  . CAD, ARTERY BYPASS GRAFT 11/18/2008   Qualifier: Diagnosis of  By: Angelena Form, MD, Harrell Gave    . Chronic pain    . CVA (cerebral infarction)     Small left occipital  . Dementia with behavioral disturbance 02/15/2015  . Depression    . Diabetic peripheral neuropathy ([place]) 09/28/2011  . DM        .  DM (diabetes mellitus), type 2 with peripheral vascular complications ([place]) 3/55/7322   Qualifier: Diagnosis of  By: Burnett Kanaris    . Gout    . History of alcohol abuse    . Hyperlipidemia    . HYPERTENSION        . Hypertensive heart disease with heart failure ([place]) 09/01/2008   Qualifier: Diagnosis of  By: Burnett Kanaris    . Insomnia 04/19/2012  . Ischemic cardiomyopathy    . Narcolepsy    . Obesity   . OBESITY 09/01/2008   Qualifier: Diagnosis of  By: Burnett Kanaris    . OBSTRUCTIVE SLEEP APNEA        . Obstructive sleep apnea 09/16/2008   Qualifier: Diagnosis of  By: [name] MD, [name]   . Pacemaker 12/12/2014  . Parkinsonism ([place]) 09/30/2015  . [facility] spotted fever   . SINUS TACHYCARDIA 09/02/2008   Qualifier: Diagnosis of  By: Caryl Comes, MD, [name]   . TIA (transient ischemic attack)    . TOBACCO ABUSE 07/31/2007   Qualifier: Diagnosis of  By: [name] MD, [name]   . Tremor 08/11/2015  . UNIVERSAL ULCERATIVE COLITIS        . Vitamin D deficiency 01/07/2015    Past Surgical History:  Procedure Laterality Date  . CARDIAC CATHETERIZATION     Triple-vessel coronary artery disease. Low-normal left ventricular systolic function with mild   anteroapical wall motion abnormality.   [name] Kitchen  CARDIAC CATHETERIZATION N/A 01/28/2015   Procedure: Temporary Pacemaker;  Surgeon: [name], MD;  Location: [facility];  Service: Cardiovascular;  Laterality: N/A;  . CORONARY ARTERY BYPASS GRAFT     Median sternotomy, extracorporeal circulation,  coronary artery bypass graft surgery x4 using a sequential left internal   mammary artery graft to the mid and distal left anterior descending, a   saphenous vein graft to diagonal branch of the left anterior descending,   and a saphenous vein graft to the obtuse marginal branch of left   circumflex coronary artery.    . EP IMPLANTABLE DEVICE N/A 12/12/2014   Procedure: Pacemaker Implant;  Surgeon: [name], MD;   Location: [facility];  Service: Cardiovascular;  Laterality: N/A;  . EP IMPLANTABLE DEVICE N/A 01/28/2015   Procedure: PPM Generator Removal;  Surgeon: [name], MD;  Location: [facility];  Service: Cardiovascular;  Laterality: N/A;  . EP IMPLANTABLE DEVICE N/A 03/20/2015   Procedure: Pacemaker Implant;  Surgeon: [name], MD;  Location: [facility];  Service: Cardiovascular;  Laterality: N/A;  . FLEXIBLE SIGMOIDOSCOPY  01/17/2012   Procedure: FLEXIBLE SIGMOIDOSCOPY;  Surgeon: Lafayette Dragon, MD;  Location: [facility];  Service: Endoscopy;  Laterality: N/A;  . MULTIPLE EXTRACTIONS WITH ALVEOLOPLASTY  12/01/2011   Procedure: MULTIPLE EXTRACION WITH ALVEOLOPLASTY;  Surgeon: Lenn Cal, DDS;  Location: [facility];  Service: Oral Surgery;  Laterality: N/A;  Extraction of tooth #'s 3,4,5,6,7,8,9,10,11,12,13,14,15,16,17,20,21,22,23,24,25,26,27,28,29,30,31,32 with alveoloplasty and bilateral mandibular lingual tori  . TONSILECTOMY, ADENOIDECTOMY, BILATERAL MYRINGOTOMY AND TUBES  1948    Allergies as of 10/02/2017      Reactions   Benzedrine [amphetamine] Nausea Only   Dexedrine [dextroamphetamine Sulfate Er] Other (See Comments)   agitation   Oxycodone Nausea And Vomiting   Vancomycin    Unknown - per [facility]      Medication List        Accurate as of 10/02/17 11:59 PM. Always use your most recent med list.          aspirin 81 MG chewable tablet Chew 81 mg by mouth at bedtime.   carbidopa-levodopa 25-100 MG tablet Commonly known as:  SINEMET IR Take 2 tablets by mouth 3 (three) times daily with meals.   carvedilol 3.125 MG tablet Commonly known as:  COREG Take 3.125 mg by mouth 2 (two) times daily with a meal. Hold for SBP less than 105   clonazePAM 0.5 MG tablet Commonly known as:  KLONOPIN Take 0.5 mg by mouth at bedtime.   donepezil 10 MG tablet Commonly known as:  ARICEPT Take 10 mg by mouth at bedtime.   doxazosin 1 MG tablet Commonly known  as:  CARDURA Take 1 mg by mouth at bedtime.   fenofibrate 145 MG tablet Commonly known as:  TRICOR Take 145 mg by mouth daily. With a meal   Fish Oil 1000 MG Caps Take 1 capsule by mouth 2 (two) times daily.   furosemide 40 MG tablet Commonly known as:  LASIX Take 40 mg by mouth daily.   gabapentin 400 MG capsule Commonly known as:  NEURONTIN Take 400 mg by mouth 3 (three) times daily.   HUMALOG KWIKPEN 100 UNIT/ML KiwkPen Generic drug:  insulin lispro Inject 13 Units into the skin 3 (three) times daily. Hold for CBG  < 200. ( to be given in addition to sliding scale) Check FSBS before meals and at bedtime. SSI 201-25 4 units, 251-300 6  units, 301-350 8 units, 351-400 10 units   lamoTRIgine 25 MG tablet Commonly known as:  LAMICTAL Take 25 mg by mouth daily. give one tab with Lamictal 100 mg to equal 125 mg daily   lamoTRIgine 100 MG tablet Commonly known as:  LAMICTAL Take 100 mg by mouth daily. Give 100 mg tablet along with 25 mg tablet to equal 125 mg total   LEXAPRO 5 MG tablet Generic drug:  escitalopram Take 5 mg by mouth daily.   lidocaine 5 % Commonly known as:  LIDODERM Place 1 patch onto the skin. Apply 1 patch to lower back on in AM. Remove patch from lower back in PM Remove & Discard patch within 12 hours or as directed by MD   losartan 25 MG tablet Commonly known as:  COZAAR Take 25 mg by mouth every morning.   Magnesium 250 MG Tabs Take by mouth. TAKE 2 TABLETS (500 MG) BY MOUTH DAILY   metFORMIN 1000 MG tablet Commonly known as:  GLUCOPHAGE Take 1,000 mg by mouth 2 (two) times daily.   multivitamin tablet Take 1 tablet by mouth daily.   nitroGLYCERIN 0.4 MG SL tablet Commonly known as:  NITROSTAT Place 0.4 mg under the tongue every 5 (five) minutes as needed for chest pain.   OXYGEN Inhale 2 L into the lungs continuous. To keep spo2 above 90%   PHENERGAN PO Take by mouth. Phenergan tab 25mg  p.o. every 6 hours PRNX 3 doses. Notify MD if  symptoms persist for more than 24 hours. (Physician Order)   polyethylene glycol packet Commonly known as:  MIRALAX / GLYCOLAX Take 17 g by mouth daily.   pravastatin 40 MG tablet Commonly known as:  PRAVACHOL Take 40 mg by mouth at bedtime.   TOUJEO MAX SOLOSTAR 300 UNIT/ML Sopn Generic drug:  Insulin Glargine Inject 50 Units into the skin at bedtime.   traMADol 50 MG tablet Commonly known as:  ULTRAM Take 50 mg by mouth as directed. Every shift for pain   traZODone 50 MG tablet Commonly known as:  DESYREL Take 50 mg by mouth at bedtime.   Vitamin D3 50000 units Caps Take 1 capsule by mouth once a week.       No orders of the defined types were placed in this encounter.   Immunization History  Administered Date(s) Administered  . Influenza-Unspecified 10/28/2016  . PPD Test 03/20/2013  . Pneumococcal Conjugate-13 10/09/2015  . Pneumococcal Polysaccharide-23 09/02/2014    Social History   Tobacco Use  . Smoking status: Former Smoker    Packs/day: 2.00    Years: 50.00    Pack years: 100.00    Last attempt to quit: 08/18/2014    Years since quitting: 3.1  . Smokeless tobacco: Former Network engineer Use Topics  . Alcohol use: No    Comment: quit 2003    Review of Systems  DATA OBTAINED: from patient-limited; nursing-no acute concerns GENERAL:  no fevers, fatigue, appetite changes SKIN: No itching, rash HEENT: No complaint RESPIRATORY: No cough, wheezing, SOB CARDIAC: No chest pain, palpitations, lower extremity edema  GI: No abdominal pain, No N/V/D or constipation, No heartburn or reflux  GU: No dysuria, frequency or urgency, or incontinence  MUSCULOSKELETAL: No unrelieved bone/joint pain NEUROLOGIC: No headache, dizziness  PSYCHIATRIC: No overt anxiety or sadness  Vitals:   10/02/17 1437  BP: (!) 148/80  Pulse: 82  Resp: 20  Temp: (!) 97.5 F (36.4 C)   Body mass index is 40.34 kg/m. Physical Exam  GENERAL APPEARANCE: Alert, minimally  conversant, No acute distress  SKIN: No diaphoresis rash HEENT: Unremarkable RESPIRATORY: Breathing is even, unlabored. Lung sounds are clear   CARDIOVASCULAR: Heart RRR no murmurs, rubs or gallops. No peripheral edema  GASTROINTESTINAL: Abdomen is soft, non-tender, not distended w/ normal bowel sounds.  GENITOURINARY: Bladder non tender, not distended  MUSCULOSKELETAL: No abnormal joints or musculature NEUROLOGIC: Cranial nerves 2-12 grossly intact. Moves all extremities PSYCHIATRIC: Mood and affect flat, no behavioral issues  Patient Active Problem List   Diagnosis Date Noted  . Peripheral vascular disease ([place]) 02/04/2017  . Controlled diabetes mellitus type 2 with complications ([place]) 81/82/9937  . Aspiration pneumonia ([place]) 09/30/2015  . Parkinsonism ([place]) 09/30/2015  . Tremor 08/11/2015  . Suicidal ideation 05/10/2015  . Bipolar depression ([place]) 05/10/2015  . Complete heart block ([place]) 03/20/2015  . Bradycardia 03/14/2015  . Dementia with behavioral disturbance 02/15/2015  . Staphylococcus aureus bacteremia with sepsis ([place]) 01/28/2015  . Infection of pacemaker pocket ([place]) 01/28/2015  . Acute respiratory failure ([place]) 01/26/2015  . AKI (acute kidney injury) ([place]) 01/26/2015  . Acute encephalopathy 01/26/2015  . Altered mental status   . Vitamin D deficiency 01/07/2015  . Mobitz type 2 second degree atrioventricular block 12/12/2014  . Pacemaker 12/12/2014  . Symptomatic bradycardia 12/03/2014  . Lower back pain 06/12/2012  . Right hip pain 06/12/2012  . Intertrigo 05/09/2012  . Insomnia 04/19/2012  . General weakness 03/07/2012  . 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 Diarrhea 01/17/2012  . Pyelonephritis 01/15/2012  . Diabetic peripheral neuropathy ([place]) 09/28/2011  . Urinary incontinence 09/28/2011  . Preventative health care 09/23/2011  . Gout 09/23/2011  . Bipolar affective disorder ([place]) 09/23/2011  . TIA (transient ischemic attack)  09/23/2011  . Chronic pain 09/23/2011  . DNR (do not resuscitate) 09/23/2011  . CVA (cerebral infarction) 09/23/2011  . 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 History of alcohol abuse 09/23/2011  . Benign prostatic hyperplasia   . Left ventricular systolic dysfunction   . Chronic systolic CHF (congestive heart failure) ([place]) 05/05/2011  . Edema 01/03/2011  . CAD (coronary artery disease) 05/18/2010  . Hyperlipidemia 05/18/2010  . Other abnormality of urination(788.69) 11/25/2009  . Other specified forms of chronic ischemic heart disease 05/19/2009  . CAD, ARTERY BYPASS GRAFT 11/18/2008  . AMI, INFERIOR WALL 09/30/2008  . Obstructive sleep apnea 09/16/2008  . SINUS TACHYCARDIA 09/02/2008  . DM (diabetes mellitus), type 2 with peripheral vascular complications ([place]) 16/96/7893  . OBESITY 09/01/2008  . Hypertensive heart disease with heart failure ([place]) 09/01/2008  . TOBACCO ABUSE 07/31/2007  . 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    CMP     Component Value Date/Time   NA 142 02/11/2017   K 4.3 02/11/2017   CL 102 03/20/2015 1038   CO2 28 03/20/2015 1038   GLUCOSE 171 (H) 03/20/2015 1038   BUN 35 (A) 02/11/2017   CREATININE 2.2 (A) 02/11/2017   CREATININE 1.23 03/20/2015 1038   CREATININE 1.06 12/05/2014 1536   CALCIUM 9.6 03/20/2015 1038   PROT 5.8 (L) 02/01/2015 0245   ALBUMIN 2.2 (L) 02/01/2015 0245   AST 17 02/11/2017   ALT 6 (A) 02/11/2017   ALKPHOS 21 (A) 02/11/2017   BILITOT 0.4 02/01/2015 0245   GFRNONAA 55 (L) 03/20/2015 1038   GFRAA >60 03/20/2015 1038   Recent Labs    02/11/17  NA 142  K 4.3  BUN 35*  CREATININE 2.2*   Recent Labs    02/11/17  AST 17  ALT 6*  ALKPHOS 21*   Recent Labs    02/11/17  WBC 6.3  HGB 11.2*  HCT 31*  PLT 243   Recent Labs    02/11/17  CHOL 154  LDLCALC 59  TRIG 295*   Lab Results  Component Value Date   MICROALBUR >42.6 04/09/2016   Lab Results  Component Value Date   TSH 1.89 02/11/2017   Lab Results    Component Value Date   HGBA1C 8 02/11/2017   Lab Results  Component Value Date   CHOL 154 02/11/2017   HDL 36 02/11/2017   LDLCALC 59 02/11/2017   LDLDIRECT 85.2 06/13/2012   TRIG 295 (A) 02/11/2017   CHOLHDL 7 06/13/2012    Significant Diagnostic Results in last 30 days:  No results found.  Assessment and Plan  DM (diabetes mellitus), type 2 with peripheral vascular complications (HCC) K8J is better than 7 been which is 8, patient is on Toujeo 50 units daily, NovoLog 13 units with every meal and Glucophage 1000 mg twice daily.  Patient is on ARB and statin.  Hypertensive heart disease with heart failure (HCC) Controlled on multiple agents including Coreg 3.125 mg twice daily, Cozaar 25 mg daily, Lasix 40 mg daily and Cardura 1 mg nightly  Hyperlipidemia Controlled; continue fenofibrate 140 mg daily, Zocor 40 mg daily and fish oil thousand milligrams twice daily     Anne D. Victor Coil, MD

## 2017-10-08 ENCOUNTER — Encounter: Payer: Self-pay | Admitting: Internal Medicine

## 2017-10-08 NOTE — Assessment & Plan Note (Signed)
Controlled; continue fenofibrate 140 mg daily, Zocor 40 mg daily and fish oil thousand milligrams twice daily

## 2017-10-08 NOTE — Assessment & Plan Note (Signed)
Controlled on multiple agents including Coreg 3.125 mg twice daily, Cozaar 25 mg daily, Lasix 40 mg daily and Cardura 1 mg nightly

## 2017-10-08 NOTE — Assessment & Plan Note (Signed)
A1c is better than 7 been which is 8, patient is on Toujeo 50 units daily, NovoLog 13 units with every meal and Glucophage 1000 mg twice daily.  Patient is on ARB and statin.

## 2017-10-19 ENCOUNTER — Ambulatory Visit (INDEPENDENT_AMBULATORY_CARE_PROVIDER_SITE_OTHER): Payer: Medicare Other | Admitting: *Deleted

## 2017-10-19 DIAGNOSIS — I442 Atrioventricular block, complete: Secondary | ICD-10-CM | POA: Diagnosis not present

## 2017-10-19 DIAGNOSIS — R001 Bradycardia, unspecified: Secondary | ICD-10-CM

## 2017-10-19 NOTE — Progress Notes (Signed)
Remote pacemaker transmission.   

## 2017-10-30 ENCOUNTER — Encounter: Payer: Self-pay | Admitting: Internal Medicine

## 2017-10-30 ENCOUNTER — Non-Acute Institutional Stay (SKILLED_NURSING_FACILITY): Payer: Medicare Other | Admitting: Internal Medicine

## 2017-10-30 DIAGNOSIS — I25708 Atherosclerosis of coronary artery bypass graft(s), unspecified, with other forms of angina pectoris: Secondary | ICD-10-CM | POA: Diagnosis not present

## 2017-10-30 DIAGNOSIS — E1142 Type 2 diabetes mellitus with diabetic polyneuropathy: Secondary | ICD-10-CM

## 2017-10-30 DIAGNOSIS — I5022 Chronic systolic (congestive) heart failure: Secondary | ICD-10-CM

## 2017-10-30 NOTE — Progress Notes (Signed)
Location:  [place] [facility] Room Number: [phone] Place of Service:  SNF [id number])  [name], MD  Patient Care Team: [name], MD as PCP - General (Internal Medicine) [name], MD as Consulting Physician (Cardiology) [name], MD as Consulting Physician (Neurology)  Extended Emergency Contact Information Primary Emergency Contact: [name],[name] Address: [address]          [place], [place] [address] [name] of [place] Phone: [phone] Mobile Phone: [phone] Relation: Spouse Secondary Emergency Contact: [name], [name] of [place] Phone: [phone] Mobile Phone: [phone] Relation: Daughter    Allergies: Benzedrine [amphetamine]; Dexedrine [dextroamphetamine sulfate er]; Oxycodone; and Vancomycin  Chief Complaint  Patient presents with  . Medical Management of Chronic Issues    Routine Visit    HPI: Patient is 77 y.o. male who   Past Medical History:  Diagnosis Date  . 2nd degree AV block    a. s/p STJ dual chamber pacemaker 11/2014  . Acute respiratory failure ([place]) 01/26/2015  . AKI (acute kidney injury) ([place]) 01/26/2015  . AMI, INFERIOR WALL 09/30/2008   Qualifier: Diagnosis of  By: Unk Lightning, RN, BSN, Melanie    . Benign neoplasm of colon     Adenomatous polyps  . Benign prostatic hyperplasia   . Bipolar affective disorder ([place])    . Bipolar depression ([place]) 05/10/2015  . BPH (benign prostatic hypertrophy)   . CAD (coronary artery disease)    a. s/p CABG  . CAD, ARTERY BYPASS GRAFT 11/18/2008   Qualifier: Diagnosis of  By: Angelena Form, MD, Harrell Gave    . Chronic pain    . CVA (cerebral infarction)     Small left occipital  . Dementia with behavioral disturbance ([place]) 02/15/2015  . Depression    . Diabetic peripheral neuropathy ([place]) 09/28/2011  . DM        . DM (diabetes mellitus), type 2 with peripheral vascular complications ([place]) 07/29/4578   Qualifier: Diagnosis of  By: Burnett Kanaris    . Gout    . History of alcohol abuse    . Hyperlipidemia    . HYPERTENSION        . Hypertensive heart disease with heart failure ([place]) 09/01/2008   Qualifier: Diagnosis of  By: Burnett Kanaris    . Insomnia 04/19/2012  . Ischemic cardiomyopathy    . Narcolepsy    . Obesity   . OBESITY 09/01/2008   Qualifier: Diagnosis of  By: Burnett Kanaris    . OBSTRUCTIVE SLEEP APNEA        . Obstructive sleep apnea 09/16/2008   Qualifier: Diagnosis of  By: [name] MD, [name]   . Pacemaker 12/12/2014  . Parkinsonism ([place]) 09/30/2015  . [facility] spotted fever   . SINUS TACHYCARDIA 09/02/2008   Qualifier: Diagnosis of  By: Caryl Comes, MD, [name]   . TIA (transient ischemic attack)    . TOBACCO ABUSE 07/31/2007   Qualifier: Diagnosis of  By: [name] MD, [name]   . Tremor 08/11/2015  . UNIVERSAL ULCERATIVE COLITIS        . Vitamin D deficiency 01/07/2015    Past Surgical History:  Procedure Laterality Date  . CARDIAC CATHETERIZATION     Triple-vessel coronary artery disease. Low-normal left ventricular systolic function with mild   anteroapical wall motion abnormality.   [name] Kitchen CARDIAC CATHETERIZATION N/A 01/28/2015   Procedure: Temporary Pacemaker;  Surgeon: Carleene Overlie  Peyton Najjar, MD;  Location: [facility];  Service: Cardiovascular;  Laterality: N/A;  . CORONARY ARTERY BYPASS GRAFT     Median sternotomy, extracorporeal circulation,  coronary artery bypass graft surgery x4 using a sequential left internal   mammary artery graft to the mid and distal left anterior descending, a   saphenous vein graft to diagonal branch of the left anterior descending,   and a saphenous vein graft to the obtuse marginal branch of left   circumflex coronary artery.    . EP IMPLANTABLE DEVICE N/A 12/12/2014   Procedure: Pacemaker Implant;  Surgeon: [name], MD;  Location: [facility];  Service: Cardiovascular;  Laterality: N/A;  . EP  IMPLANTABLE DEVICE N/A 01/28/2015   Procedure: PPM Generator Removal;  Surgeon: [name], MD;  Location: [facility];  Service: Cardiovascular;  Laterality: N/A;  . EP IMPLANTABLE DEVICE N/A 03/20/2015   Procedure: Pacemaker Implant;  Surgeon: [name], MD;  Location: [facility];  Service: Cardiovascular;  Laterality: N/A;  . FLEXIBLE SIGMOIDOSCOPY  01/17/2012   Procedure: FLEXIBLE SIGMOIDOSCOPY;  Surgeon: Lafayette Dragon, MD;  Location: [facility];  Service: Endoscopy;  Laterality: N/A;  . MULTIPLE EXTRACTIONS WITH ALVEOLOPLASTY  12/01/2011   Procedure: MULTIPLE EXTRACION WITH ALVEOLOPLASTY;  Surgeon: Lenn Cal, DDS;  Location: [facility];  Service: Oral Surgery;  Laterality: N/A;  Extraction of tooth #'s 3,4,5,6,7,8,9,10,11,12,13,14,15,16,17,20,21,22,23,24,25,26,27,28,29,30,31,32 with alveoloplasty and bilateral mandibular lingual tori  . TONSILECTOMY, ADENOIDECTOMY, BILATERAL MYRINGOTOMY AND TUBES  1948    Allergies as of 10/30/2017      Reactions   Benzedrine [amphetamine] Nausea Only   Dexedrine [dextroamphetamine Sulfate Er] Other (See Comments)   agitation   Oxycodone Nausea And Vomiting   Vancomycin    Unknown - per [facility]      Medication List        Accurate as of 10/30/17 11:59 PM. Always use your most recent med list.          aspirin 81 MG chewable tablet Chew 81 mg by mouth at bedtime.   carbidopa-levodopa 25-100 MG tablet Commonly known as:  SINEMET IR Take 2 tablets by mouth 3 (three) times daily with meals.   carvedilol 3.125 MG tablet Commonly known as:  COREG Take 3.125 mg by mouth 2 (two) times daily with a meal. Hold for SBP less than 105   clonazePAM 0.5 MG tablet Commonly known as:  KLONOPIN Take 0.5 mg by mouth at bedtime.   donepezil 10 MG tablet Commonly known as:  ARICEPT Take 10 mg by mouth at bedtime.   doxazosin 1 MG tablet Commonly known as:  CARDURA Take 1 mg by mouth at bedtime.   fenofibrate 145 MG  tablet Commonly known as:  TRICOR Take 145 mg by mouth daily. With a meal   Fish Oil 1000 MG Caps Take 1 capsule by mouth 2 (two) times daily.   furosemide 40 MG tablet Commonly known as:  LASIX Take 40 mg by mouth daily.   gabapentin 400 MG capsule Commonly known as:  NEURONTIN Take 400 mg by mouth 3 (three) times daily.   HUMALOG KWIKPEN 100 UNIT/ML KiwkPen Generic drug:  insulin lispro Inject 13 Units into the skin 3 (three) times daily. Hold for CBG  < 200. ( to be given in addition to sliding scale) Check FSBS before meals and at bedtime. SSI 201-25 4 units, 251-300 6 units, 301-350 8 units, 351-400 10 units   lamoTRIgine 25 MG  tablet Commonly known as:  LAMICTAL Take 25 mg by mouth daily. give one tab with Lamictal 100 mg to equal 125 mg daily   lamoTRIgine 100 MG tablet Commonly known as:  LAMICTAL Take 100 mg by mouth daily. Give 100 mg tablet along with 25 mg tablet to equal 125 mg total   LEXAPRO 5 MG tablet Generic drug:  escitalopram Take 5 mg by mouth daily.   lidocaine 5 % Commonly known as:  LIDODERM Place 1 patch onto the skin. Apply 1 patch to lower back on in AM. Remove patch from lower back in PM Remove & Discard patch within 12 hours or as directed by MD   losartan 25 MG tablet Commonly known as:  COZAAR Take 25 mg by mouth every morning.   Magnesium Oxide 500 MG Tabs Take 1 tablet by mouth daily.   metFORMIN 1000 MG tablet Commonly known as:  GLUCOPHAGE Take 1,000 mg by mouth 2 (two) times daily.   multivitamin tablet Take 1 tablet by mouth daily.   nitroGLYCERIN 0.4 MG SL tablet Commonly known as:  NITROSTAT Place 0.4 mg under the tongue every 5 (five) minutes as needed for chest pain.   OXYGEN Inhale 2 L into the lungs continuous. To keep spo2 above 90%   polyethylene glycol packet Commonly known as:  MIRALAX / GLYCOLAX Take 17 g by mouth daily.   pravastatin 40 MG tablet Commonly known as:  PRAVACHOL Take 40 mg by mouth at  bedtime.   TOUJEO MAX SOLOSTAR 300 UNIT/ML Sopn Generic drug:  Insulin Glargine Inject 50 Units into the skin at bedtime.   traMADol 50 MG tablet Commonly known as:  ULTRAM Take 50 mg by mouth as directed. Every shift for pain   traZODone 50 MG tablet Commonly known as:  DESYREL Take 50 mg by mouth at bedtime.   Vitamin D3 50000 units Caps Take 1 capsule by mouth once a week.       No orders of the defined types were placed in this encounter.   Immunization History  Administered Date(s) Administered  . Influenza-Unspecified 10/28/2016  . PPD Test 03/20/2013  . Pneumococcal Conjugate-13 10/09/2015  . Pneumococcal Polysaccharide-23 09/02/2014    Social History   Tobacco Use  . Smoking status: Former Smoker    Packs/day: 2.00    Years: 50.00    Pack years: 100.00    Last attempt to quit: 08/18/2014    Years since quitting: 3.2  . Smokeless tobacco: Former Network engineer Use Topics  . Alcohol use: No    Comment: quit 2003    Review of Systems  DATA OBTAINED: from nurse GENERAL:  no fevers, fatigue, appetite changes SKIN: No itching, rash HEENT: No complaint RESPIRATORY: No cough, wheezing, SOB CARDIAC: No chest pain, palpitations, lower extremity edema  GI: No abdominal pain, No N/V/D or constipation, No heartburn or reflux  GU: No dysuria, frequency or urgency, or incontinence  MUSCULOSKELETAL: No unrelieved bone/joint pain NEUROLOGIC: No headache, dizziness  PSYCHIATRIC: No overt anxiety or sadness  Vitals:   10/30/17 1528  BP: (!) 146/78  Pulse: 84  Resp: (!) 22  Temp: (!) 97.4 F (36.3 C)   Body mass index is 40.85 kg/m. Physical Exam  GENERAL APPEARANCE: Alert, minimally conversant, No acute distress  SKIN: No diaphoresis rash HEENT: Unremarkable RESPIRATORY: Breathing is even, unlabored. Lung sounds are clear   CARDIOVASCULAR: Heart RRR no murmurs, rubs or gallops.  Trace peripheral edema  GASTROINTESTINAL: Abdomen is soft, non-tender, not  distended w/ normal bowel sounds.  GENITOURINARY: Bladder non tender, not distended  MUSCULOSKELETAL: No abnormal joints or musculature NEUROLOGIC: Cranial nerves 2-12 grossly intact. Moves all extremities, very slow movements PSYCHIATRIC: Mood and affect dementia, no behavioral issues  Patient Active Problem List   Diagnosis Date Noted  . Peripheral vascular disease ([place]) 02/04/2017  . Controlled diabetes mellitus type 2 with complications ([place]) 54/00/8676  . Aspiration pneumonia ([place]) 09/30/2015  . Parkinsonism ([place]) 09/30/2015  . Tremor 08/11/2015  . Suicidal ideation 05/10/2015  . Bipolar depression ([place]) 05/10/2015  . Complete heart block ([place]) 03/20/2015  . Bradycardia 03/14/2015  . Dementia with behavioral disturbance ([place]) 02/15/2015  . Staphylococcus aureus bacteremia with sepsis ([place]) 01/28/2015  . Infection of pacemaker pocket ([place]) 01/28/2015  . Acute respiratory failure ([place]) 01/26/2015  . AKI (acute kidney injury) ([place]) 01/26/2015  . Acute encephalopathy 01/26/2015  . Altered mental status   . Vitamin D deficiency 01/07/2015  . Mobitz type 2 second degree atrioventricular block 12/12/2014  . Pacemaker 12/12/2014  . Symptomatic bradycardia 12/03/2014  . Lower back pain 06/12/2012  . Right hip pain 06/12/2012  . Intertrigo 05/09/2012  . Insomnia 04/19/2012  . General weakness 03/07/2012  . 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 Diarrhea 01/17/2012  . Pyelonephritis 01/15/2012  . Diabetic peripheral neuropathy ([place]) 09/28/2011  . Urinary incontinence 09/28/2011  . Preventative health care 09/23/2011  . Gout 09/23/2011  . Bipolar affective disorder ([place]) 09/23/2011  . TIA (transient ischemic attack) 09/23/2011  . Chronic pain 09/23/2011  . DNR (do not resuscitate) 09/23/2011  . CVA (cerebral infarction) 09/23/2011  . 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 History of alcohol abuse 09/23/2011  . Benign prostatic hyperplasia   . Left ventricular systolic  dysfunction   . Chronic systolic CHF (congestive heart failure) ([place]) 05/05/2011  . Edema 01/03/2011  . CAD (coronary artery disease) 05/18/2010  . Hyperlipidemia 05/18/2010  . Other abnormality of urination(788.69) 11/25/2009  . Other specified forms of chronic ischemic heart disease 05/19/2009  . CAD, ARTERY BYPASS GRAFT 11/18/2008  . AMI, INFERIOR WALL 09/30/2008  . Obstructive sleep apnea 09/16/2008  . SINUS TACHYCARDIA 09/02/2008  . DM (diabetes mellitus), type 2 with peripheral vascular complications ([place]) 19/50/9326  . OBESITY 09/01/2008  . Hypertensive heart disease with heart failure ([place]) 09/01/2008  . TOBACCO ABUSE 07/31/2007  . 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    CMP     Component Value Date/Time   NA 142 02/11/2017   K 4.3 02/11/2017   CL 102 03/20/2015 1038   CO2 28 03/20/2015 1038   GLUCOSE 171 (H) 03/20/2015 1038   BUN 35 (A) 02/11/2017   CREATININE 2.2 (A) 02/11/2017   CREATININE 1.23 03/20/2015 1038   CREATININE 1.06 12/05/2014 1536   CALCIUM 9.6 03/20/2015 1038   PROT 5.8 (L) 02/01/2015 0245   ALBUMIN 2.2 (L) 02/01/2015 0245   AST 17 02/11/2017   ALT 6 (A) 02/11/2017   ALKPHOS 21 (A) 02/11/2017   BILITOT 0.4 02/01/2015 0245   GFRNONAA 55 (L) 03/20/2015 1038   GFRAA >60 03/20/2015 1038   Recent Labs    02/11/17  NA 142  K 4.3  BUN 35*  CREATININE 2.2*   Recent Labs    02/11/17  AST 17  ALT 6*  ALKPHOS 21*   Recent Labs    02/11/17  WBC 6.3  HGB 11.2*  HCT 31*  PLT 243   Recent Labs    02/11/17  CHOL 154  LDLCALC 59  TRIG 295*  Lab Results  Component Value Date   MICROALBUR >42.6 04/09/2016   Lab Results  Component Value Date   TSH 1.89 02/11/2017   Lab Results  Component Value Date   HGBA1C 8 02/11/2017   Lab Results  Component Value Date   CHOL 154 02/11/2017   HDL 36 02/11/2017   LDLCALC 59 02/11/2017   LDLDIRECT 85.2 06/13/2012   TRIG 295 (A) 02/11/2017   CHOLHDL 7 06/13/2012    Significant  Diagnostic Results in last 30 days:  No results found.  Assessment and Plan  CAD (coronary artery disease) No recent chest pain reported; continue aspirin 81 mg daily, Coreg 3.125 mg twice daily and sublingual nitroglycerin as needed; patient is on statin  Chronic systolic CHF (congestive heart failure) (HCC) No recent exacerbations; continue Coreg 3.25 mg twice daily, Cozaar 25 mg daily and Lasix 40 mg daily  Diabetic peripheral neuropathy (HCC) No complaints of pain; continue Neurontin 400 mg 3 times daily     Dennisse Swader D. [name], MD

## 2017-11-18 ENCOUNTER — Encounter: Payer: Self-pay | Admitting: Internal Medicine

## 2017-11-18 NOTE — Assessment & Plan Note (Signed)
No complaints of pain; continue Neurontin 400 mg 3 times daily

## 2017-11-18 NOTE — Assessment & Plan Note (Signed)
No recent exacerbations; continue Coreg 3.25 mg twice daily, Cozaar 25 mg daily and Lasix 40 mg daily

## 2017-11-18 NOTE — Assessment & Plan Note (Signed)
No recent chest pain reported; continue aspirin 81 mg daily, Coreg 3.125 mg twice daily and sublingual nitroglycerin as needed; patient is on statin

## 2017-11-27 LAB — CUP PACEART REMOTE DEVICE CHECK
Battery Remaining Percentage: 95.5 %
Battery Voltage: 2.99 V
Brady Statistic AP VP Percent: 31 %
Brady Statistic AS VS Percent: 1 %
Brady Statistic RA Percent Paced: 29 %
Brady Statistic RV Percent Paced: 99 %
Date Time Interrogation Session: 20191002132632
Implantable Lead Implant Date: 20170303
Implantable Lead Location: 753859
Implantable Lead Location: 753860
Implantable Pulse Generator Implant Date: 20170303
Lead Channel Pacing Threshold Amplitude: 1.125 V
Lead Channel Pacing Threshold Pulse Width: 0.5 ms
Lead Channel Pacing Threshold Pulse Width: 0.5 ms
Lead Channel Sensing Intrinsic Amplitude: 5 mV
Lead Channel Setting Pacing Amplitude: 0.875
Lead Channel Setting Pacing Pulse Width: 0.5 ms
Lead Channel Setting Sensing Sensitivity: 4 mV
MDC IDC LEAD IMPLANT DT: 20170303
MDC IDC MSMT BATTERY REMAINING LONGEVITY: 118 mo
MDC IDC MSMT LEADCHNL RA IMPEDANCE VALUE: 440 Ohm
MDC IDC MSMT LEADCHNL RV IMPEDANCE VALUE: 440 Ohm
MDC IDC MSMT LEADCHNL RV PACING THRESHOLD AMPLITUDE: 0.625 V
MDC IDC SET LEADCHNL RA PACING AMPLITUDE: 2.5 V
MDC IDC STAT BRADY AP VS PERCENT: 1 %
MDC IDC STAT BRADY AS VP PERCENT: 69 %
Pulse Gen Model: 2240
Pulse Gen Serial Number: 7888922

## 2017-12-01 ENCOUNTER — Encounter: Payer: Self-pay | Admitting: Internal Medicine

## 2017-12-01 ENCOUNTER — Non-Acute Institutional Stay (SKILLED_NURSING_FACILITY): Payer: Medicare Other | Admitting: Internal Medicine

## 2017-12-01 DIAGNOSIS — F0281 Dementia in other diseases classified elsewhere with behavioral disturbance: Secondary | ICD-10-CM | POA: Diagnosis not present

## 2017-12-01 DIAGNOSIS — G300 Alzheimer's disease with early onset: Secondary | ICD-10-CM

## 2017-12-01 DIAGNOSIS — F3175 Bipolar disorder, in partial remission, most recent episode depressed: Secondary | ICD-10-CM | POA: Diagnosis not present

## 2017-12-01 DIAGNOSIS — E559 Vitamin D deficiency, unspecified: Secondary | ICD-10-CM | POA: Diagnosis not present

## 2017-12-01 DIAGNOSIS — F02818 Dementia in other diseases classified elsewhere, unspecified severity, with other behavioral disturbance: Secondary | ICD-10-CM

## 2017-12-01 NOTE — Progress Notes (Signed)
Location:  [facility] Room Number: [phone] Place of Service:  SNF [phone])  Victor Delaine. [name], MD  Patient Care Team: [name], MD as PCP - General (Internal Medicine) [name], MD as Consulting Physician (Cardiology) [name], MD as Consulting Physician (Neurology)  Extended Emergency Contact Information Primary Emergency Contact: [name] Address: [address]          [place], [place] [address] [name] Phone: [phone] Mobile Phone: [phone] Relation: Spouse Secondary Emergency Contact: [name], [name] of [place] Phone: [phone] Mobile Phone: [phone] Relation: Daughter    Allergies: Benzedrine [amphetamine]; Dexedrine [dextroamphetamine sulfate er]; Oxycodone; and Vancomycin  Chief Complaint  Patient presents with  . Medical Management of Chronic Issues    Routine Visit  . Health Maintenance    Eye exam. Hemoglobin A1C and influenza vacc.    HPI: Patient is 77 y.o. male who is being seen for routine issues of dementia with behavioral disturbance, vitamin D deficiency, and bipolar affective disorder.  Past Medical History:  Diagnosis Date  . 2nd degree AV block    a. s/p STJ dual chamber pacemaker 11/2014  . Acute respiratory failure ([place]) 01/26/2015  . AKI (acute kidney injury) ([place]) 01/26/2015  . AMI, INFERIOR WALL 09/30/2008   Qualifier: Diagnosis of  By: Unk Lightning, RN, BSN, Melanie    . Benign neoplasm of colon     Adenomatous polyps  . Benign prostatic hyperplasia   . Bipolar affective disorder ([place])    . Bipolar depression ([place]) 05/10/2015  . BPH (benign prostatic hypertrophy)   . CAD (coronary artery disease)    a. s/p CABG  . CAD, ARTERY BYPASS GRAFT 11/18/2008   Qualifier: Diagnosis of  By: Angelena Form, MD, Harrell Gave    . Chronic pain    . CVA (cerebral infarction)     Small left occipital  . Dementia with behavioral  disturbance ([place]) 02/15/2015  . Depression    . Diabetic peripheral neuropathy ([place]) 09/28/2011  . DM        . DM (diabetes mellitus), type 2 with peripheral vascular complications ([place]) 1/74/0814   Qualifier: Diagnosis of  By: Burnett Kanaris    . Gout    . History of alcohol abuse    . Hyperlipidemia    . HYPERTENSION        . Hypertensive heart disease with heart failure ([place]) 09/01/2008   Qualifier: Diagnosis of  By: Burnett Kanaris    . Insomnia 04/19/2012  . Ischemic cardiomyopathy    . Narcolepsy    . Obesity   . OBESITY 09/01/2008   Qualifier: Diagnosis of  By: Burnett Kanaris    . OBSTRUCTIVE SLEEP APNEA        . Obstructive sleep apnea 09/16/2008   Qualifier: Diagnosis of  By: [name] MD, [name]   . Pacemaker 12/12/2014  . Parkinsonism ([place]) 09/30/2015  . [facility] spotted fever   . SINUS TACHYCARDIA 09/02/2008   Qualifier: Diagnosis of  By: Caryl Comes, MD, [name]   . TIA (transient ischemic attack)    . TOBACCO ABUSE 07/31/2007   Qualifier: Diagnosis of  By: [name] MD, [name]   . Tremor 08/11/2015  . UNIVERSAL ULCERATIVE COLITIS        . Vitamin D deficiency 01/07/2015    Past Surgical History:  Procedure Laterality Date  . CARDIAC CATHETERIZATION  Triple-vessel coronary artery disease. Low-normal left ventricular systolic function with mild   anteroapical wall motion abnormality.   [name] Kitchen CARDIAC CATHETERIZATION N/A 01/28/2015   Procedure: Temporary Pacemaker;  Surgeon: [name], MD;  Location: [facility];  Service: Cardiovascular;  Laterality: N/A;  . CORONARY ARTERY BYPASS GRAFT     Median sternotomy, extracorporeal circulation,  coronary artery bypass graft surgery x4 using a sequential left internal   mammary artery graft to the mid and distal left anterior descending, a   saphenous vein graft to diagonal branch of the left anterior descending,   and a saphenous vein graft to the obtuse marginal branch of left   circumflex coronary  artery.    . EP IMPLANTABLE DEVICE N/A 12/12/2014   Procedure: Pacemaker Implant;  Surgeon: [name], MD;  Location: [facility];  Service: Cardiovascular;  Laterality: N/A;  . EP IMPLANTABLE DEVICE N/A 01/28/2015   Procedure: PPM Generator Removal;  Surgeon: [name], MD;  Location: [facility];  Service: Cardiovascular;  Laterality: N/A;  . EP IMPLANTABLE DEVICE N/A 03/20/2015   Procedure: Pacemaker Implant;  Surgeon: [name], MD;  Location: [facility];  Service: Cardiovascular;  Laterality: N/A;  . FLEXIBLE SIGMOIDOSCOPY  01/17/2012   Procedure: FLEXIBLE SIGMOIDOSCOPY;  Surgeon: Lafayette Dragon, MD;  Location: [facility];  Service: Endoscopy;  Laterality: N/A;  . MULTIPLE EXTRACTIONS WITH ALVEOLOPLASTY  12/01/2011   Procedure: MULTIPLE EXTRACION WITH ALVEOLOPLASTY;  Surgeon: Lenn Cal, DDS;  Location: [facility];  Service: Oral Surgery;  Laterality: N/A;  Extraction of tooth #'s 3,4,5,6,7,8,9,10,11,12,13,14,15,16,17,20,21,22,23,24,25,26,27,28,29,30,31,32 with alveoloplasty and bilateral mandibular lingual tori  . TONSILECTOMY, ADENOIDECTOMY, BILATERAL MYRINGOTOMY AND TUBES  1948    Allergies as of 12/01/2017      Reactions   Benzedrine [amphetamine] Nausea Only   Dexedrine [dextroamphetamine Sulfate Er] Other (See Comments)   agitation   Oxycodone Nausea And Vomiting   Vancomycin    Unknown - per [facility]      Medication List        Accurate as of 12/01/17 11:59 PM. Always use your most recent med list.          aspirin 81 MG chewable tablet Chew 81 mg by mouth at bedtime.   carbidopa-levodopa 25-100 MG tablet Commonly known as:  SINEMET IR Take 2 tablets by mouth 3 (three) times daily with meals.   carvedilol 3.125 MG tablet Commonly known as:  COREG Take 3.125 mg by mouth 2 (two) times daily with a meal. Hold for SBP less than 105   clonazePAM 0.5 MG tablet Commonly known as:  KLONOPIN Take 0.5 mg by mouth at bedtime.   donepezil  10 MG tablet Commonly known as:  ARICEPT Take 10 mg by mouth at bedtime.   doxazosin 1 MG tablet Commonly known as:  CARDURA Take 1 mg by mouth at bedtime.   fenofibrate 145 MG tablet Commonly known as:  TRICOR Take 145 mg by mouth daily. With a meal   Fish Oil 1000 MG Caps Take 1 capsule by mouth 2 (two) times daily.   furosemide 40 MG tablet Commonly known as:  LASIX Take 40 mg by mouth daily.   gabapentin 400 MG capsule Commonly known as:  NEURONTIN Take 400 mg by mouth 3 (three) times daily.   HUMALOG KWIKPEN 100 UNIT/ML KiwkPen Generic drug:  insulin lispro Inject 13 Units into the skin 3 (three) times daily. Hold for CBG  < 200. ( to  be given in addition to sliding scale) Check FSBS before meals and at bedtime. SSI 201-25 4 units, 251-300 6 units, 301-350 8 units, 351-400 10 units   lamoTRIgine 25 MG tablet Commonly known as:  LAMICTAL Take 25 mg by mouth daily. give one tab with Lamictal 100 mg to equal 125 mg daily   lamoTRIgine 100 MG tablet Commonly known as:  LAMICTAL Take 100 mg by mouth daily. Give 100 mg tablet along with 25 mg tablet to equal 125 mg total   LEXAPRO 5 MG tablet Generic drug:  escitalopram Take 5 mg by mouth daily.   lidocaine 5 % Commonly known as:  LIDODERM Place 1 patch onto the skin. Apply 1 patch to lower back on in AM. Remove patch from lower back in PM Remove & Discard patch within 12 hours or as directed by MD   losartan 25 MG tablet Commonly known as:  COZAAR Take 25 mg by mouth every morning.   Magnesium Oxide 500 MG Tabs Take 1 tablet by mouth daily.   metFORMIN 1000 MG tablet Commonly known as:  GLUCOPHAGE Take 1,000 mg by mouth 2 (two) times daily.   multivitamin tablet Take 1 tablet by mouth daily.   nitroGLYCERIN 0.4 MG SL tablet Commonly known as:  NITROSTAT Place 0.4 mg under the tongue every 5 (five) minutes as needed for chest pain.   OXYGEN Inhale 2 L into the lungs continuous. To keep spo2 above 90%     polyethylene glycol packet Commonly known as:  MIRALAX / GLYCOLAX Take 17 g by mouth daily.   pravastatin 40 MG tablet Commonly known as:  PRAVACHOL Take 40 mg by mouth at bedtime.   TOUJEO MAX SOLOSTAR 300 UNIT/ML Sopn Generic drug:  Insulin Glargine Inject 50 Units into the skin at bedtime.   traMADol 50 MG tablet Commonly known as:  ULTRAM Take 50 mg by mouth as directed. Every shift for pain   traZODone 50 MG tablet Commonly known as:  DESYREL Take 50 mg by mouth at bedtime.   Vitamin D3 1.25 MG (50000 UT) Caps Take 1 capsule by mouth once a week.       No orders of the defined types were placed in this encounter.   Immunization History  Administered Date(s) Administered  . Influenza-Unspecified 10/28/2016  . PPD Test 03/20/2013  . Pneumococcal Conjugate-13 10/09/2015  . Pneumococcal Polysaccharide-23 09/02/2014    Social History   Tobacco Use  . Smoking status: Former Smoker    Packs/day: 2.00    Years: 50.00    Pack years: 100.00    Last attempt to quit: 08/18/2014    Years since quitting: 3.2  . Smokeless tobacco: Former Network engineer Use Topics  . Alcohol use: No    Comment: quit 2003    Review of Systems  DATA OBTAINED: from patient-limited; nursing- no acute concerns GENERAL:  no fevers, fatigue, appetite changes SKIN: No itching, rash HEENT: No complaint RESPIRATORY: No cough, wheezing, SOB CARDIAC: No chest pain, palpitations, lower extremity edema  GI: No abdominal pain, No N/V/D or constipation, No heartburn or reflux  GU: No dysuria, frequency or urgency, or incontinence  MUSCULOSKELETAL: No unrelieved bone/joint pain NEUROLOGIC: No headache, dizziness  PSYCHIATRIC: No overt anxiety or sadness  Vitals:   12/01/17 1138  BP: 124/74  Pulse: 78  Resp: 18  Temp: (!) 97.1 F (36.2 C)  SpO2: 95%   Body mass index is 40.85 kg/m. Physical Exam  GENERAL APPEARANCE: Alert, minimally conversant, No  acute distress  SKIN: No  diaphoresis rash HEENT: Unremarkable RESPIRATORY: Breathing is even, unlabored. Lung sounds are clear   CARDIOVASCULAR: Heart RRR no murmurs, rubs or gallops. No peripheral edema  GASTROINTESTINAL: Abdomen is soft, non-tender, not distended w/ normal bowel sounds.  GENITOURINARY: Bladder non tender, not distended  MUSCULOSKELETAL: No abnormal joints or musculature NEUROLOGIC: Cranial nerves 2-12 grossly intact. Moves all extremities PSYCHIATRIC: Mood and affect flat, no behavioral issues  Patient Active Problem List   Diagnosis Date Noted  . Peripheral vascular disease ([place]) 02/04/2017  . Controlled diabetes mellitus type 2 with complications ([place]) 68/34/1962  . Aspiration pneumonia ([place]) 09/30/2015  . Parkinsonism ([place]) 09/30/2015  . Tremor 08/11/2015  . Suicidal ideation 05/10/2015  . Bipolar depression ([place]) 05/10/2015  . Complete heart block ([place]) 03/20/2015  . Bradycardia 03/14/2015  . Dementia with behavioral disturbance ([place]) 02/15/2015  . Staphylococcus aureus bacteremia with sepsis ([place]) 01/28/2015  . Infection of pacemaker pocket ([place]) 01/28/2015  . Acute respiratory failure ([place]) 01/26/2015  . AKI (acute kidney injury) ([place]) 01/26/2015  . Acute encephalopathy 01/26/2015  . Altered mental status   . Vitamin D deficiency 01/07/2015  . Mobitz type 2 second degree atrioventricular block 12/12/2014  . Pacemaker 12/12/2014  . Symptomatic bradycardia 12/03/2014  . Lower back pain 06/12/2012  . Right hip pain 06/12/2012  . Intertrigo 05/09/2012  . Insomnia 04/19/2012  . General weakness 03/07/2012  . 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 Diarrhea 01/17/2012  . Pyelonephritis 01/15/2012  . Diabetic peripheral neuropathy ([place]) 09/28/2011  . Urinary incontinence 09/28/2011  . Preventative health care 09/23/2011  . Gout 09/23/2011  . Bipolar affective disorder ([place]) 09/23/2011  . TIA (transient ischemic attack) 09/23/2011  . Chronic pain 09/23/2011    . DNR (do not resuscitate) 09/23/2011  . CVA (cerebral infarction) 09/23/2011  . 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 History of alcohol abuse 09/23/2011  . Benign prostatic hyperplasia   . Left ventricular systolic dysfunction   . Chronic systolic CHF (congestive heart failure) ([place]) 05/05/2011  . Edema 01/03/2011  . CAD (coronary artery disease) 05/18/2010  . Hyperlipidemia 05/18/2010  . Other abnormality of urination(788.69) 11/25/2009  . Other specified forms of chronic ischemic heart disease 05/19/2009  . CAD, ARTERY BYPASS GRAFT 11/18/2008  . AMI, INFERIOR WALL 09/30/2008  . Obstructive sleep apnea 09/16/2008  . SINUS TACHYCARDIA 09/02/2008  . DM (diabetes mellitus), type 2 with peripheral vascular complications ([place]) 22/97/9892  . OBESITY 09/01/2008  . Hypertensive heart disease with heart failure ([place]) 09/01/2008  . TOBACCO ABUSE 07/31/2007  . 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    CMP     Component Value Date/Time   NA 142 02/11/2017   K 4.3 02/11/2017   CL 102 03/20/2015 1038   CO2 28 03/20/2015 1038   GLUCOSE 171 (H) 03/20/2015 1038   BUN 35 (A) 02/11/2017   CREATININE 2.2 (A) 02/11/2017   CREATININE 1.23 03/20/2015 1038   CREATININE 1.06 12/05/2014 1536   CALCIUM 9.6 03/20/2015 1038   PROT 5.8 (L) 02/01/2015 0245   ALBUMIN 2.2 (L) 02/01/2015 0245   AST 17 02/11/2017   ALT 6 (A) 02/11/2017   ALKPHOS 21 (A) 02/11/2017   BILITOT 0.4 02/01/2015 0245   GFRNONAA 55 (L) 03/20/2015 1038   GFRAA >60 03/20/2015 1038   Recent Labs    02/11/17  NA 142  K 4.3  BUN 35*  CREATININE 2.2*   Recent Labs    02/11/17  AST 17  ALT 6*  ALKPHOS 21*  Recent Labs    02/11/17  WBC 6.3  HGB 11.2*  HCT 31*  PLT 243   Recent Labs    02/11/17  CHOL 154  LDLCALC 59  TRIG 295*   Lab Results  Component Value Date   MICROALBUR >42.6 04/09/2016   Lab Results  Component Value Date   TSH 1.89 02/11/2017   Lab Results  Component Value Date   HGBA1C 8 02/11/2017    Lab Results  Component Value Date   CHOL 154 02/11/2017   HDL 36 02/11/2017   LDLCALC 59 02/11/2017   LDLDIRECT 85.2 06/13/2012   TRIG 295 (A) 02/11/2017   CHOLHDL 7 06/13/2012    Significant Diagnostic Results in last 30 days:  No results found.  Assessment and Plan  Dementia with behavioral disturbance Continues very slow decline; continue Aricept 10 mg daily; continue supportive care  Vitamin D deficiency Vitamin D 5 months ago was 40; can safely DC vitamin D  Bipolar affective disorder ([place]) Mood is stable; continue Lamictal 125 mg daily     Autym Siess D. [name], MD

## 2017-12-04 ENCOUNTER — Encounter: Payer: Self-pay | Admitting: Internal Medicine

## 2017-12-04 NOTE — Assessment & Plan Note (Signed)
Vitamin D 5 months ago was 40; can safely DC vitamin D

## 2017-12-04 NOTE — Assessment & Plan Note (Signed)
Mood is stable; continue Lamictal 125 mg daily

## 2017-12-04 NOTE — Assessment & Plan Note (Signed)
Continues very slow decline; continue Aricept 10 mg daily; continue supportive care

## 2017-12-29 ENCOUNTER — Non-Acute Institutional Stay (SKILLED_NURSING_FACILITY): Payer: Medicare Other | Admitting: Internal Medicine

## 2017-12-29 ENCOUNTER — Encounter: Payer: Self-pay | Admitting: Internal Medicine

## 2017-12-29 DIAGNOSIS — I110 Hypertensive heart disease with heart failure: Secondary | ICD-10-CM

## 2017-12-29 DIAGNOSIS — I739 Peripheral vascular disease, unspecified: Secondary | ICD-10-CM

## 2017-12-29 DIAGNOSIS — I442 Atrioventricular block, complete: Secondary | ICD-10-CM

## 2017-12-29 NOTE — Progress Notes (Signed)
Location:  [facility] Room Number: [phone] Place of Service:  SNF [id number])  [name]. [name], MD  Patient Care Team: [name], MD as PCP - General (Internal Medicine) [name], MD as Consulting Physician (Cardiology) [name], MD as Consulting Physician (Neurology)  Extended Emergency Contact Information Primary Emergency Contact: [name],[name] Address: [address]          [place], [place] [address] [name] of [place] Phone: [phone] Mobile Phone: [phone] Relation: Spouse Secondary Emergency Contact: [name], [name] of [place] Phone: [phone] Mobile Phone: [phone] Relation: Daughter    Allergies: Benzedrine [amphetamine]; Dexedrine [dextroamphetamine sulfate er]; Oxycodone; and Vancomycin  Chief Complaint  Patient presents with  . Medical Management of Chronic Issues    Routine Visit    HPI: Patient is 77 y.o. male who is being seen for routine issues of hypertension, complete heart block status post pacemaker, and peripheral vascular disease.  Past Medical History:  Diagnosis Date  . 2nd degree AV block    a. s/p STJ dual chamber pacemaker 11/2014  . Acute respiratory failure ([place]) 01/26/2015  . AKI (acute kidney injury) ([place]) 01/26/2015  . AMI, INFERIOR WALL 09/30/2008   Qualifier: Diagnosis of  By: Unk Lightning, RN, BSN, Melanie    . Benign neoplasm of colon     Adenomatous polyps  . Benign prostatic hyperplasia   . Bipolar affective disorder ([place])    . Bipolar depression ([place]) 05/10/2015  . BPH (benign prostatic hypertrophy)   . CAD (coronary artery disease)    a. s/p CABG  . CAD, ARTERY BYPASS GRAFT 11/18/2008   Qualifier: Diagnosis of  By: Angelena Form, MD, Harrell Gave    . Chronic pain    . CVA (cerebral infarction)     Small left occipital  . Dementia with behavioral disturbance ([place]) 02/15/2015  . Depression    . Diabetic peripheral  neuropathy ([place]) 09/28/2011  . DM        . DM (diabetes mellitus), type 2 with peripheral vascular complications ([place]) 9/73/5329   Qualifier: Diagnosis of  By: Burnett Kanaris    . Gout    . History of alcohol abuse    . Hyperlipidemia    . HYPERTENSION        . Hypertensive heart disease with heart failure ([place]) 09/01/2008   Qualifier: Diagnosis of  By: Burnett Kanaris    . Insomnia 04/19/2012  . Ischemic cardiomyopathy    . Narcolepsy    . Obesity   . OBESITY 09/01/2008   Qualifier: Diagnosis of  By: Burnett Kanaris    . OBSTRUCTIVE SLEEP APNEA        . Obstructive sleep apnea 09/16/2008   Qualifier: Diagnosis of  By: [name] MD, [name]   . Pacemaker 12/12/2014  . Parkinsonism ([place]) 09/30/2015  . [facility] spotted fever   . SINUS TACHYCARDIA 09/02/2008   Qualifier: Diagnosis of  By: Caryl Comes, MD, [name]   . TIA (transient ischemic attack)    . TOBACCO ABUSE 07/31/2007   Qualifier: Diagnosis of  By: [name] MD, [name]   . Tremor 08/11/2015  . UNIVERSAL ULCERATIVE COLITIS        . Vitamin D deficiency 01/07/2015    Past Surgical History:  Procedure Laterality Date  . CARDIAC CATHETERIZATION     Triple-vessel coronary artery disease. Low-normal left ventricular systolic function with mild   anteroapical  wall motion abnormality.   [name] Kitchen CARDIAC CATHETERIZATION N/A 01/28/2015   Procedure: Temporary Pacemaker;  Surgeon: [name], MD;  Location: [facility];  Service: Cardiovascular;  Laterality: N/A;  . CORONARY ARTERY BYPASS GRAFT     Median sternotomy, extracorporeal circulation,  coronary artery bypass graft surgery x4 using a sequential left internal   mammary artery graft to the mid and distal left anterior descending, a   saphenous vein graft to diagonal branch of the left anterior descending,   and a saphenous vein graft to the obtuse marginal branch of left   circumflex coronary artery.    . EP IMPLANTABLE DEVICE N/A 12/12/2014   Procedure:  Pacemaker Implant;  Surgeon: [name], MD;  Location: [facility];  Service: Cardiovascular;  Laterality: N/A;  . EP IMPLANTABLE DEVICE N/A 01/28/2015   Procedure: PPM Generator Removal;  Surgeon: [name], MD;  Location: [facility];  Service: Cardiovascular;  Laterality: N/A;  . EP IMPLANTABLE DEVICE N/A 03/20/2015   Procedure: Pacemaker Implant;  Surgeon: [name], MD;  Location: [facility];  Service: Cardiovascular;  Laterality: N/A;  . FLEXIBLE SIGMOIDOSCOPY  01/17/2012   Procedure: FLEXIBLE SIGMOIDOSCOPY;  Surgeon: Lafayette Dragon, MD;  Location: [facility];  Service: Endoscopy;  Laterality: N/A;  . MULTIPLE EXTRACTIONS WITH ALVEOLOPLASTY  12/01/2011   Procedure: MULTIPLE EXTRACION WITH ALVEOLOPLASTY;  Surgeon: Lenn Cal, DDS;  Location: [facility];  Service: Oral Surgery;  Laterality: N/A;  Extraction of tooth #'s 3,4,5,6,7,8,9,10,11,12,13,14,15,16,17,20,21,22,23,24,25,26,27,28,29,30,31,32 with alveoloplasty and bilateral mandibular lingual tori  . TONSILECTOMY, ADENOIDECTOMY, BILATERAL MYRINGOTOMY AND TUBES  1948    Allergies as of 12/29/2017      Reactions   Benzedrine [amphetamine] Nausea Only   Dexedrine [dextroamphetamine Sulfate Er] Other (See Comments)   agitation   Oxycodone Nausea And Vomiting   Vancomycin    Unknown - per [facility]      Medication List       Accurate as of December 29, 2017 11:59 PM. Always use your most recent med list.        aspirin 81 MG chewable tablet Chew 81 mg by mouth at bedtime.   carbidopa-levodopa 25-100 MG tablet Commonly known as:  SINEMET IR Take 2 tablets by mouth 3 (three) times daily with meals.   carvedilol 3.125 MG tablet Commonly known as:  COREG Take 3.125 mg by mouth 2 (two) times daily with a meal. Hold for SBP less than 105   clonazePAM 0.5 MG tablet Commonly known as:  KLONOPIN Take 0.5 mg by mouth at bedtime.   docusate sodium 100 MG capsule Commonly known as:  COLACE Take 100  mg by mouth daily as needed for mild constipation.   donepezil 10 MG tablet Commonly known as:  ARICEPT Take 10 mg by mouth at bedtime.   doxazosin 1 MG tablet Commonly known as:  CARDURA Take 1 mg by mouth at bedtime.   fenofibrate 145 MG tablet Commonly known as:  TRICOR Take 145 mg by mouth daily. With a meal   Fish Oil 1000 MG Caps Take 1 capsule by mouth 3 (three) times daily.   furosemide 40 MG tablet Commonly known as:  LASIX Take 40 mg by mouth daily.   gabapentin 400 MG capsule Commonly known as:  NEURONTIN Take 400 mg by mouth 3 (three) times daily.   HUMALOG KWIKPEN 100 UNIT/ML KiwkPen Generic drug:  insulin lispro Inject 20 Units into the skin 3 (three) times daily.  Hold for CBG  < 200. ( to be given in addition to sliding scale) Check FSBS before meals and at bedtime. SSI 201-25 4 units, 251-300 6 units, 301-350 8 units, 351-400 10 units   lamoTRIgine 25 MG tablet Commonly known as:  LAMICTAL Take 25 mg by mouth daily. give one tab with Lamictal 100 mg to equal 125 mg daily   lamoTRIgine 100 MG tablet Commonly known as:  LAMICTAL Take 100 mg by mouth daily. Give 100 mg tablet along with 25 mg tablet to equal 125 mg total   LEXAPRO 5 MG tablet Generic drug:  escitalopram Take 5 mg by mouth daily.   lidocaine 5 % Commonly known as:  LIDODERM Place 1 patch onto the skin. Apply 1 patch to lower back on in AM. Remove patch from lower back in PM Remove & Discard patch within 12 hours or as directed by MD   losartan 25 MG tablet Commonly known as:  COZAAR Take 25 mg by mouth every morning.   Magnesium Oxide 500 MG Tabs Take 1 tablet by mouth daily.   metFORMIN 1000 MG tablet Commonly known as:  GLUCOPHAGE Take 1,000 mg by mouth 2 (two) times daily.   multivitamin tablet Take 1 tablet by mouth daily.   nitroGLYCERIN 0.4 MG SL tablet Commonly known as:  NITROSTAT Place 0.4 mg under the tongue every 5 (five) minutes as needed for chest pain.     OXYGEN Inhale 2 L into the lungs continuous. To keep spo2 above 90%   polyethylene glycol packet Commonly known as:  MIRALAX / GLYCOLAX Take 17 g by mouth daily.   pravastatin 40 MG tablet Commonly known as:  PRAVACHOL Take 40 mg by mouth at bedtime.   TOUJEO MAX SOLOSTAR 300 UNIT/ML Sopn Generic drug:  Insulin Glargine Inject 60 Units into the skin at bedtime. (PRIME PEN WITH 3 UNITS PRIOR TO EACH USE - DO NOT MIX WITH ANY OTHER INSULIN - USE WITHIN 42 DAYS AFTER OPENING   traMADol 50 MG tablet Commonly known as:  ULTRAM Take 50 mg by mouth as directed. Every shift for pain   traZODone 50 MG tablet Commonly known as:  DESYREL Take 50 mg by mouth at bedtime.       No orders of the defined types were placed in this encounter.   Immunization History  Administered Date(s) Administered  . Influenza-Unspecified 10/28/2016  . PPD Test 03/20/2013  . Pneumococcal Conjugate-13 10/09/2015  . Pneumococcal Polysaccharide-23 09/02/2014    Social History   Tobacco Use  . Smoking status: Former Smoker    Packs/day: 2.00    Years: 50.00    Pack years: 100.00    Last attempt to quit: 08/18/2014    Years since quitting: 3.3  . Smokeless tobacco: Former Network engineer Use Topics  . Alcohol use: No    Comment: quit 2003    Review of Systems  DATA OBTAINED: from patient is limited; nursing-no acute concerns GENERAL:  no fevers, fatigue, appetite changes SKIN: No itching, rash HEENT: No complaint RESPIRATORY: No cough, wheezing, SOB CARDIAC: No chest pain, palpitations, lower extremity edema  GI: No abdominal pain, No N/V/D or constipation, No heartburn or reflux  GU: No dysuria, frequency or urgency, or incontinence  MUSCULOSKELETAL: No unrelieved bone/joint pain NEUROLOGIC: No headache, dizziness  PSYCHIATRIC: No overt anxiety or sadness  Vitals:   12/29/17 1427  BP: (!) 151/82  Pulse: 65  Resp: 18  Temp: 98.1 F (36.7 C)   Body mass  index is 41.23  kg/m. Physical Exam  GENERAL APPEARANCE: Alert, only conversant, No acute distress: Obese white male always in bed SKIN: No diaphoresis rash HEENT: Unremarkable RESPIRATORY: Breathing is even, unlabored. Lung sounds are clear   CARDIOVASCULAR: Heart RRR no murmurs, rubs or gallops. No peripheral edema  GASTROINTESTINAL: Abdomen is soft, non-tender, not distended w/ normal bowel sounds.  GENITOURINARY: Bladder non tender, not distended  MUSCULOSKELETAL: Stiff extremities consistent with Parkinson's NEUROLOGIC: Cranial nerves 2-12 grossly intact. Moves all extremities PSYCHIATRIC: Mood and affect Tneshia, no behavioral issues  Patient Active Problem List   Diagnosis Date Noted  . Peripheral vascular disease ([place]) 02/04/2017  . Controlled diabetes mellitus type 2 with complications ([place]) 35/57/3220  . Aspiration pneumonia ([place]) 09/30/2015  . Parkinsonism ([place]) 09/30/2015  . Tremor 08/11/2015  . Suicidal ideation 05/10/2015  . Bipolar depression ([place]) 05/10/2015  . Complete heart block ([place]) 03/20/2015  . Bradycardia 03/14/2015  . Dementia with behavioral disturbance ([place]) 02/15/2015  . Staphylococcus aureus bacteremia with sepsis ([place]) 01/28/2015  . Infection of pacemaker pocket ([place]) 01/28/2015  . Acute respiratory failure ([place]) 01/26/2015  . AKI (acute kidney injury) ([place]) 01/26/2015  . Acute encephalopathy 01/26/2015  . Altered mental status   . Vitamin D deficiency 01/07/2015  . Mobitz type 2 second degree atrioventricular block 12/12/2014  . Pacemaker 12/12/2014  . Symptomatic bradycardia 12/03/2014  . Lower back pain 06/12/2012  . Right hip pain 06/12/2012  . Intertrigo 05/09/2012  . Insomnia 04/19/2012  . General weakness 03/07/2012  . 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 Diarrhea 01/17/2012  . Pyelonephritis 01/15/2012  . Diabetic peripheral neuropathy ([place]) 09/28/2011  . Urinary incontinence 09/28/2011  . Preventative health care 09/23/2011   . Gout 09/23/2011  . Bipolar affective disorder ([place]) 09/23/2011  . TIA (transient ischemic attack) 09/23/2011  . Chronic pain 09/23/2011  . DNR (do not resuscitate) 09/23/2011  . CVA (cerebral infarction) 09/23/2011  . 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 History of alcohol abuse 09/23/2011  . Benign prostatic hyperplasia   . Left ventricular systolic dysfunction   . Chronic systolic CHF (congestive heart failure) ([place]) 05/05/2011  . Edema 01/03/2011  . CAD (coronary artery disease) 05/18/2010  . Hyperlipidemia 05/18/2010  . Other abnormality of urination(788.69) 11/25/2009  . Other specified forms of chronic ischemic heart disease 05/19/2009  . CAD, ARTERY BYPASS GRAFT 11/18/2008  . AMI, INFERIOR WALL 09/30/2008  . Obstructive sleep apnea 09/16/2008  . SINUS TACHYCARDIA 09/02/2008  . DM (diabetes mellitus), type 2 with peripheral vascular complications ([place]) 25/42/7062  . OBESITY 09/01/2008  . Hypertensive heart disease with heart failure ([place]) 09/01/2008  . TOBACCO ABUSE 07/31/2007  . 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    CMP     Component Value Date/Time   NA 142 02/11/2017   K 4.3 02/11/2017   CL 102 03/20/2015 1038   CO2 28 03/20/2015 1038   GLUCOSE 171 (H) 03/20/2015 1038   BUN 35 (A) 02/11/2017   CREATININE 2.2 (A) 02/11/2017   CREATININE 1.23 03/20/2015 1038   CREATININE 1.06 12/05/2014 1536   CALCIUM 9.6 03/20/2015 1038   PROT 5.8 (L) 02/01/2015 0245   ALBUMIN 2.2 (L) 02/01/2015 0245   AST 17 02/11/2017   ALT 6 (A) 02/11/2017   ALKPHOS 21 (A) 02/11/2017   BILITOT 0.4 02/01/2015 0245   GFRNONAA 55 (L) 03/20/2015 1038   GFRAA >60 03/20/2015 1038   Recent Labs    02/11/17  NA 142  K 4.3  BUN 35*  CREATININE 2.2*  Recent Labs    02/11/17  AST 17  ALT 6*  ALKPHOS 21*   Recent Labs    02/11/17  WBC 6.3  HGB 11.2*  HCT 31*  PLT 243   Recent Labs    02/11/17  CHOL 154  LDLCALC 59  TRIG 295*   Lab Results  Component Value Date    MICROALBUR >42.6 04/09/2016   Lab Results  Component Value Date   TSH 1.89 02/11/2017   Lab Results  Component Value Date   HGBA1C 8 02/11/2017   Lab Results  Component Value Date   CHOL 154 02/11/2017   HDL 36 02/11/2017   LDLCALC 59 02/11/2017   LDLDIRECT 85.2 06/13/2012   TRIG 295 (A) 02/11/2017   CHOLHDL 7 06/13/2012    Significant Diagnostic Results in last 30 days:  No results found.  Assessment and Plan  Hypertensive heart disease with heart failure ([place]) Occasionally elevated at other times completely controlled on multiple agents; will increase Cozaar to 50 mg daily and continue Cardura 1 mg nightly, Lasix 40 mg daily, and Coreg 3.125 mg twice daily  Complete heart block ([place]) Patient has been stable since implantation of permanent pacemaker in 03-2015; no signs or symptoms of rhythm problems; continue to monitor  Peripheral vascular disease ([place]) Currently wounds reported; continue ASA 81 mg daily, patient is on statin, and last A1c was 8 which while not well controlled is really good for this noncompliant patient    [name]. [name], MD

## 2018-01-06 ENCOUNTER — Encounter: Payer: Self-pay | Admitting: Internal Medicine

## 2018-01-06 NOTE — Assessment & Plan Note (Signed)
Patient has been stable since implantation of permanent pacemaker in 03-2015; no signs or symptoms of rhythm problems; continue to monitor

## 2018-01-06 NOTE — Assessment & Plan Note (Signed)
Currently wounds reported; continue ASA 81 mg daily, patient is on statin, and last A1c was 8 which while not well controlled is really good for this noncompliant patient

## 2018-01-06 NOTE — Assessment & Plan Note (Signed)
Occasionally elevated at other times completely controlled on multiple agents; will increase Cozaar to 50 mg daily and continue Cardura 1 mg nightly, Lasix 40 mg daily, and Coreg 3.125 mg twice daily

## 2018-01-16 ENCOUNTER — Other Ambulatory Visit: Payer: Self-pay

## 2018-01-18 ENCOUNTER — Ambulatory Visit: Payer: Medicare Other

## 2018-01-18 ENCOUNTER — Other Ambulatory Visit: Payer: Self-pay

## 2018-01-18 MED ORDER — TRAMADOL HCL 50 MG PO TABS: 50.0000 mg | ORAL_TABLET | Freq: Three times a day (TID) | ORAL | 0 refills | Status: DC

## 2018-01-18 MED ORDER — TRAZODONE HCL 50 MG PO TABS: 50.0000 mg | ORAL_TABLET | Freq: Every day | ORAL | 0 refills | Status: DC

## 2018-01-19 ENCOUNTER — Encounter: Payer: Self-pay | Admitting: Cardiology

## 2018-01-23 LAB — BASIC METABOLIC PANEL
BUN: 65 — AB (ref 4–21)
Creatinine: 2.7 — AB (ref 0.6–1.3)
GLUCOSE: 272
POTASSIUM: 4.5 (ref 3.4–5.3)
Sodium: 144 (ref 137–147)

## 2018-01-23 LAB — TSH: TSH: 1.47 (ref 0.41–5.90)

## 2018-01-23 LAB — LIPID PANEL
CHOLESTEROL: 165 (ref 0–200)
HDL: 39 (ref 35–70)
LDL Cholesterol: 63
LDl/HDL Ratio: 4.2
TRIGLYCERIDES: 311 — AB (ref 40–160)

## 2018-01-23 LAB — HEMOGLOBIN A1C: HEMOGLOBIN A1C: 7.6

## 2018-01-23 LAB — VITAMIN D 25 HYDROXY (VIT D DEFICIENCY, FRACTURES): VIT D 25 HYDROXY: 43.66

## 2018-01-25 ENCOUNTER — Other Ambulatory Visit: Payer: Self-pay

## 2018-01-25 ENCOUNTER — Ambulatory Visit (INDEPENDENT_AMBULATORY_CARE_PROVIDER_SITE_OTHER): Payer: Medicare Other

## 2018-01-25 DIAGNOSIS — I442 Atrioventricular block, complete: Secondary | ICD-10-CM

## 2018-01-25 DIAGNOSIS — R001 Bradycardia, unspecified: Secondary | ICD-10-CM

## 2018-01-26 MED ORDER — CLONAZEPAM 0.5 MG PO TABS: 0.5000 mg | ORAL_TABLET | Freq: Every day | ORAL | 0 refills | Status: DC

## 2018-01-27 LAB — CUP PACEART REMOTE DEVICE CHECK
Battery Remaining Longevity: 116 mo
Battery Remaining Percentage: 95.5 %
Battery Voltage: 2.98 V
Brady Statistic AP VP Percent: 31 %
Brady Statistic AP VS Percent: 1 %
Brady Statistic AS VP Percent: 69 %
Brady Statistic AS VS Percent: 1 %
Brady Statistic RA Percent Paced: 29 %
Brady Statistic RV Percent Paced: 99 %
Date Time Interrogation Session: 20200108061529
Implantable Lead Implant Date: 20170303
Implantable Lead Implant Date: 20170303
Implantable Lead Location: 753859
Implantable Lead Location: 753860
Implantable Pulse Generator Implant Date: 20170303
Lead Channel Impedance Value: 400 Ohm
Lead Channel Impedance Value: 440 Ohm
Lead Channel Pacing Threshold Amplitude: 0.625 V
Lead Channel Pacing Threshold Amplitude: 1.125 V
Lead Channel Pacing Threshold Pulse Width: 0.5 ms
Lead Channel Pacing Threshold Pulse Width: 0.5 ms
Lead Channel Sensing Intrinsic Amplitude: 5 mV
Lead Channel Setting Pacing Amplitude: 0.875
Lead Channel Setting Sensing Sensitivity: 4 mV
MDC IDC SET LEADCHNL RA PACING AMPLITUDE: 2.5 V
MDC IDC SET LEADCHNL RV PACING PULSEWIDTH: 0.5 ms
Pulse Gen Model: 2240
Pulse Gen Serial Number: 7888922

## 2018-01-29 NOTE — Progress Notes (Signed)
Remote pacemaker transmission.   

## 2018-01-30 ENCOUNTER — Encounter: Payer: Self-pay | Admitting: Internal Medicine

## 2018-01-30 NOTE — Progress Notes (Signed)
Location:  [facility] Room Number: [phone] Place of Service:  SNF [phone])  [name]. Victor Coil, MD  Patient Care Team: [name], MD as PCP - General (Internal Medicine) [name], MD as Consulting Physician (Cardiology) [name], MD as Consulting Physician (Neurology)  Extended Emergency Contact Information Primary Emergency Contact: [name],[name] Address: [address]          [place], [place] [address] [name] Phone: [phone] Mobile Phone: [phone] Relation: Spouse Secondary Emergency Contact: [name], [name] of [place] Phone: [phone] Mobile Phone: [phone] Relation: Daughter    Allergies: Benzedrine [amphetamine]; Dexedrine [dextroamphetamine sulfate er]; Oxycodone; and Vancomycin  Chief Complaint  Patient presents with  . Medical Management of Chronic Issues    Routine Visit    HPI: Patient is 78 y.o. male who is being seen for routine issues of diabetes mellitus type 2, history of CVA, and parkinsonism.  Past Medical History:  Diagnosis Date  . 2nd degree AV block    a. s/p STJ dual chamber pacemaker 11/2014  . Acute respiratory failure ([place]) 01/26/2015  . AKI (acute kidney injury) ([place]) 01/26/2015  . AMI, INFERIOR WALL 09/30/2008   Qualifier: Diagnosis of  By: Unk Lightning, RN, BSN, Melanie    . Benign neoplasm of colon     Adenomatous polyps  . Benign prostatic hyperplasia   . Bipolar affective disorder ([place])    . Bipolar depression ([place]) 05/10/2015  . BPH (benign prostatic hypertrophy)   . CAD (coronary artery disease)    a. s/p CABG  . CAD, ARTERY BYPASS GRAFT 11/18/2008   Qualifier: Diagnosis of  By: Angelena Form, MD, Harrell Gave    . Chronic pain    . CVA (cerebral infarction)     Small left occipital  . Dementia with behavioral disturbance ([place]) 02/15/2015  . Depression    . Diabetic peripheral neuropathy ([place]) 09/28/2011  . DM      . DM (diabetes mellitus), type 2 with peripheral vascular complications ([place]) 1/77/9390   Qualifier: Diagnosis of  By: Burnett Kanaris    . Gout    . History of alcohol abuse    . Hyperlipidemia    . HYPERTENSION        . Hypertensive heart disease with heart failure ([place]) 09/01/2008   Qualifier: Diagnosis of  By: Burnett Kanaris    . Insomnia 04/19/2012  . Ischemic cardiomyopathy    . Narcolepsy    . Obesity   . OBESITY 09/01/2008   Qualifier: Diagnosis of  By: Burnett Kanaris    . OBSTRUCTIVE SLEEP APNEA        . Obstructive sleep apnea 09/16/2008   Qualifier: Diagnosis of  By: [name] MD, [name]   . Pacemaker 12/12/2014  . Parkinsonism ([place]) 09/30/2015  . [facility] spotted fever   . SINUS TACHYCARDIA 09/02/2008   Qualifier: Diagnosis of  By: Caryl Comes, MD, [name]   . TIA (transient ischemic attack)    . TOBACCO ABUSE 07/31/2007   Qualifier: Diagnosis of  By: [name] MD, [name]   . Tremor 08/11/2015  . UNIVERSAL ULCERATIVE COLITIS        . Vitamin D deficiency 01/07/2015    Past Surgical History:  Procedure Laterality Date  . CARDIAC CATHETERIZATION     Triple-vessel coronary artery disease. Low-normal left ventricular systolic function with mild   anteroapical wall motion abnormality.   [name] Kitchen  CARDIAC CATHETERIZATION N/A 01/28/2015   Procedure: Temporary Pacemaker;  Surgeon: [name], MD;  Location: [facility];  Service: Cardiovascular;  Laterality: N/A;  . CORONARY ARTERY BYPASS GRAFT     Median sternotomy, extracorporeal circulation,  coronary artery bypass graft surgery x4 using a sequential left internal   mammary artery graft to the mid and distal left anterior descending, a   saphenous vein graft to diagonal branch of the left anterior descending,   and a saphenous vein graft to the obtuse marginal branch of left   circumflex coronary artery.    . EP IMPLANTABLE DEVICE N/A 12/12/2014   Procedure: Pacemaker Implant;  Surgeon: [name],  MD;  Location: [facility];  Service: Cardiovascular;  Laterality: N/A;  . EP IMPLANTABLE DEVICE N/A 01/28/2015   Procedure: PPM Generator Removal;  Surgeon: [name], MD;  Location: [facility];  Service: Cardiovascular;  Laterality: N/A;  . EP IMPLANTABLE DEVICE N/A 03/20/2015   Procedure: Pacemaker Implant;  Surgeon: [name], MD;  Location: [facility];  Service: Cardiovascular;  Laterality: N/A;  . FLEXIBLE SIGMOIDOSCOPY  01/17/2012   Procedure: FLEXIBLE SIGMOIDOSCOPY;  Surgeon: Lafayette Dragon, MD;  Location: [facility];  Service: Endoscopy;  Laterality: N/A;  . MULTIPLE EXTRACTIONS WITH ALVEOLOPLASTY  12/01/2011   Procedure: MULTIPLE EXTRACION WITH ALVEOLOPLASTY;  Surgeon: Lenn Cal, DDS;  Location: [facility];  Service: Oral Surgery;  Laterality: N/A;  Extraction of tooth #'s 3,4,5,6,7,8,9,10,11,12,13,14,15,16,17,20,21,22,23,24,25,26,27,28,29,30,31,32 with alveoloplasty and bilateral mandibular lingual tori  . TONSILECTOMY, ADENOIDECTOMY, BILATERAL MYRINGOTOMY AND TUBES  1948    Allergies as of 01/31/2018      Reactions   Benzedrine [amphetamine] Nausea Only   Dexedrine [dextroamphetamine Sulfate Er] Other (See Comments)   agitation   Oxycodone Nausea And Vomiting   Vancomycin    Unknown - per [facility]      Medication List       Accurate as of January 31, 2018 11:59 PM. Always use your most recent med list.        aspirin 81 MG chewable tablet Chew 81 mg by mouth at bedtime.   carbidopa-levodopa 25-100 MG tablet Commonly known as:  SINEMET IR Take 2 tablets by mouth 3 (three) times daily with meals.   carvedilol 3.125 MG tablet Commonly known as:  COREG Take 3.125 mg by mouth 2 (two) times daily with a meal. Hold for SBP less than 105   clonazePAM 0.5 MG tablet Commonly known as:  KLONOPIN Take 1 tablet (0.5 mg total) by mouth at bedtime.   docusate sodium 100 MG capsule Commonly known as:  COLACE Take 100 mg by mouth daily as needed  for mild constipation.   donepezil 10 MG tablet Commonly known as:  ARICEPT Take 10 mg by mouth at bedtime.   doxazosin 1 MG tablet Commonly known as:  CARDURA Take 1 mg by mouth at bedtime.   fenofibrate 145 MG tablet Commonly known as:  TRICOR Take 145 mg by mouth daily. With a meal   Fish Oil 1000 MG Caps Take 1 capsule by mouth 3 (three) times daily.   furosemide 40 MG tablet Commonly known as:  LASIX Take 40 mg by mouth daily.   gabapentin 400 MG capsule Commonly known as:  NEURONTIN Take 400 mg by mouth 3 (three) times daily.   HUMALOG KWIKPEN 100 UNIT/ML KiwkPen Generic drug:  insulin lispro Inject 20 Units into the skin 3 (three) times daily. Hold for CBG  <  200. ( to be given in addition to sliding scale) Check FSBS before meals and at bedtime. SSI 201-25 4 units, 251-300 6 units, 301-350 8 units, 351-400 10 units   lamoTRIgine 25 MG tablet Commonly known as:  LAMICTAL Take 25 mg by mouth daily. give one tab with Lamictal 100 mg to equal 125 mg daily   lamoTRIgine 100 MG tablet Commonly known as:  LAMICTAL Take 100 mg by mouth daily. Give 100 mg tablet along with 25 mg tablet to equal 125 mg total   LEXAPRO 5 MG tablet Generic drug:  escitalopram Take 5 mg by mouth daily.   lidocaine 5 % Commonly known as:  LIDODERM Place 1 patch onto the skin. Apply 1 patch to lower back on in AM. Remove patch from lower back in PM Remove & Discard patch within 12 hours or as directed by MD   losartan 50 MG tablet Commonly known as:  COZAAR Take 50 mg by mouth every morning.   Magnesium Oxide 500 MG Tabs Take 1 tablet by mouth daily.   metFORMIN 1000 MG tablet Commonly known as:  GLUCOPHAGE Take 1,000 mg by mouth 2 (two) times daily.   multivitamin tablet Take 1 tablet by mouth daily.   nitroGLYCERIN 0.4 MG SL tablet Commonly known as:  NITROSTAT Place 0.4 mg under the tongue every 5 (five) minutes as needed for chest pain.   OXYGEN Inhale 2 L into the lungs  continuous. To keep spo2 above 90%   polyethylene glycol packet Commonly known as:  MIRALAX / GLYCOLAX Take 17 g by mouth daily.   pravastatin 40 MG tablet Commonly known as:  PRAVACHOL Take 40 mg by mouth at bedtime.   TOUJEO MAX SOLOSTAR 300 UNIT/ML Sopn Generic drug:  Insulin Glargine Inject 60 Units into the skin at bedtime. (PRIME PEN WITH 3 UNITS PRIOR TO EACH USE - DO NOT MIX WITH ANY OTHER INSULIN - USE WITHIN 42 DAYS AFTER OPENING   traMADol 50 MG tablet Commonly known as:  ULTRAM Take 1 tablet (50 mg total) by mouth every 8 (eight) hours. Every shift for pain   traZODone 50 MG tablet Commonly known as:  DESYREL Take 1 tablet (50 mg total) by mouth at bedtime.       No orders of the defined types were placed in this encounter.   Immunization History  Administered Date(s) Administered  . Influenza-Unspecified 10/28/2016  . PPD Test 03/20/2013  . Pneumococcal Conjugate-13 10/09/2015  . Pneumococcal Polysaccharide-23 09/02/2014    Social History   Tobacco Use  . Smoking status: Former Smoker    Packs/day: 2.00    Years: 50.00    Pack years: 100.00    Last attempt to quit: 08/18/2014    Years since quitting: 3.4  . Smokeless tobacco: Former Network engineer Use Topics  . Alcohol use: No    Comment: quit 2003    Review of Systems  DATA OBTAINED: from nurse GENERAL:  no fevers, fatigue, appetite changes SKIN: No itching, rash HEENT: No complaint RESPIRATORY: No cough, wheezing, SOB CARDIAC: No chest pain, palpitations, lower extremity edema  GI: No abdominal pain, No N/V/D or constipation, No heartburn or reflux  GU: No dysuria, frequency or urgency, or incontinence  MUSCULOSKELETAL: No unrelieved bone/joint pain NEUROLOGIC: No headache, dizziness  PSYCHIATRIC: No overt anxiety or sadness  Vitals:   01/30/18 1407  BP: 101/72  Pulse: 74  Resp: 18  Temp: 98.3 F (36.8 C)   Body mass index is 41.2 kg/m.  Physical Exam  GENERAL APPEARANCE:  Alert, minimally conversant, No acute distress  SKIN: No diaphoresis rash HEENT: Unremarkable RESPIRATORY: Breathing is even, unlabored.  CARDIOVASCULAR:  No peripheral edema  GASTROINTESTINAL: Abdomen is  not distended  GENITOURINARY: Bladder not distended  MUSCULOSKELETAL: No abnormal joints or musculature NEUROLOGIC: Cranial nerves 2-12 grossly intact. Moves all extremities PSYCHIATRIC: Mood and affect with dementia, no behavioral issues  Patient Active Problem List   Diagnosis Date Noted  . Peripheral vascular disease ([place]) 02/04/2017  . Controlled diabetes mellitus type 2 with complications ([place]) 32/95/1884  . Aspiration pneumonia ([place]) 09/30/2015  . Parkinsonism ([place]) 09/30/2015  . Tremor 08/11/2015  . Suicidal ideation 05/10/2015  . Bipolar depression ([place]) 05/10/2015  . Complete heart block ([place]) 03/20/2015  . Bradycardia 03/14/2015  . Dementia with behavioral disturbance ([place]) 02/15/2015  . Staphylococcus aureus bacteremia with sepsis ([place]) 01/28/2015  . Infection of pacemaker pocket ([place]) 01/28/2015  . Acute respiratory failure ([place]) 01/26/2015  . AKI (acute kidney injury) ([place]) 01/26/2015  . Acute encephalopathy 01/26/2015  . Altered mental status   . Vitamin D deficiency 01/07/2015  . Mobitz type 2 second degree atrioventricular block 12/12/2014  . Pacemaker 12/12/2014  . Symptomatic bradycardia 12/03/2014  . Lower back pain 06/12/2012  . Right hip pain 06/12/2012  . Intertrigo 05/09/2012  . Insomnia 04/19/2012  . General weakness 03/07/2012  . 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 Diarrhea 01/17/2012  . Pyelonephritis 01/15/2012  . Diabetic peripheral neuropathy ([place]) 09/28/2011  . Urinary incontinence 09/28/2011  . Preventative health care 09/23/2011  . Gout 09/23/2011  . Bipolar affective disorder ([place]) 09/23/2011  . TIA (transient ischemic attack) 09/23/2011  . Chronic pain 09/23/2011  . DNR (do not resuscitate) 09/23/2011  .  Cerebral infarction ([place]) 09/23/2011  . 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 History of alcohol abuse 09/23/2011  . Benign prostatic hyperplasia   . Left ventricular systolic dysfunction   . Chronic systolic CHF (congestive heart failure) ([place]) 05/05/2011  . Edema 01/03/2011  . CAD (coronary artery disease) 05/18/2010  . Hyperlipidemia 05/18/2010  . Other abnormality of urination(788.69) 11/25/2009  . Other specified forms of chronic ischemic heart disease 05/19/2009  . CAD, ARTERY BYPASS GRAFT 11/18/2008  . AMI, INFERIOR WALL 09/30/2008  . Obstructive sleep apnea 09/16/2008  . SINUS TACHYCARDIA 09/02/2008  . DM (diabetes mellitus), type 2 with peripheral vascular complications ([place]) 16/60/6301  . OBESITY 09/01/2008  . Hypertensive heart disease with heart failure ([place]) 09/01/2008  . TOBACCO ABUSE 07/31/2007  . 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    CMP     Component Value Date/Time   NA 144 01/23/2018   K 4.5 01/23/2018   CL 102 03/20/2015 1038   CO2 28 03/20/2015 1038   GLUCOSE 171 (H) 03/20/2015 1038   BUN 65 (A) 01/23/2018   CREATININE 2.7 (A) 01/23/2018   CREATININE 1.23 03/20/2015 1038   CREATININE 1.06 12/05/2014 1536   CALCIUM 9.6 03/20/2015 1038   PROT 5.8 (L) 02/01/2015 0245   ALBUMIN 2.2 (L) 02/01/2015 0245   AST 17 02/11/2017   ALT 6 (A) 02/11/2017   ALKPHOS 21 (A) 02/11/2017   BILITOT 0.4 02/01/2015 0245   GFRNONAA 55 (L) 03/20/2015 1038   GFRAA >60 03/20/2015 1038   Recent Labs    02/11/17 01/23/18  NA 142 144  K 4.3 4.5  BUN 35* 65*  CREATININE 2.2* 2.7*   Recent Labs    02/11/17  AST 17  ALT 6*  ALKPHOS 21*   Recent Labs  02/11/17  WBC 6.3  HGB 11.2*  HCT 31*  PLT 243   Recent Labs    02/11/17 01/23/18  CHOL 154 165  LDLCALC 59 63  TRIG 295* 311*   Lab Results  Component Value Date   MICROALBUR >42.6 04/09/2016   Lab Results  Component Value Date   TSH 1.47 01/23/2018   Lab Results  Component Value Date   HGBA1C 7.6  01/23/2018   Lab Results  Component Value Date   CHOL 165 01/23/2018   HDL 39 01/23/2018   LDLCALC 63 01/23/2018   LDLDIRECT 85.2 06/13/2012   TRIG 311 (A) 01/23/2018   CHOLHDL 7 06/13/2012    Significant Diagnostic Results in last 30 days:  No results found.  Assessment and Plan  DM (diabetes mellitus), type 2 with peripheral vascular complications (HCC) I3K is 7.6 which is absolutely fabulous for this patient; continue Toujeo 60 units daily and regular insulin 20 units with every meal with sliding scale insulin for blood sugar greater than 200; patient is on 5 to 1000 mg twice daily as well; patient is on ARB and statin  Cerebral infarction ([place]) Stable; continue ASA 81 mg daily as prophylaxis  Parkinsonism ([place]) Slowly progressive; continue Sinemet 25-100 p.o. 3 times daily    [name] D. Victor Coil, MD

## 2018-01-31 ENCOUNTER — Non-Acute Institutional Stay (SKILLED_NURSING_FACILITY): Payer: Medicare Other | Admitting: Internal Medicine

## 2018-01-31 DIAGNOSIS — I639 Cerebral infarction, unspecified: Secondary | ICD-10-CM

## 2018-01-31 DIAGNOSIS — G218 Other secondary parkinsonism: Secondary | ICD-10-CM | POA: Diagnosis not present

## 2018-01-31 DIAGNOSIS — E1151 Type 2 diabetes mellitus with diabetic peripheral angiopathy without gangrene: Secondary | ICD-10-CM

## 2018-02-02 ENCOUNTER — Encounter: Payer: Self-pay | Admitting: Internal Medicine

## 2018-02-02 NOTE — Assessment & Plan Note (Signed)
Stable; continue ASA 81 mg daily as prophylaxis 

## 2018-02-02 NOTE — Assessment & Plan Note (Signed)
A1c is 7.6 which is absolutely fabulous for this patient; continue Toujeo 60 units daily and regular insulin 20 units with every meal with sliding scale insulin for blood sugar greater than 200; patient is on 5 to 1000 mg twice daily as well; patient is on ARB and statin

## 2018-02-02 NOTE — Assessment & Plan Note (Signed)
Slowly progressive; continue Sinemet 25-100 p.o. 3 times daily

## 2018-03-01 ENCOUNTER — Encounter: Payer: Self-pay | Admitting: Internal Medicine

## 2018-03-01 ENCOUNTER — Non-Acute Institutional Stay (SKILLED_NURSING_FACILITY): Payer: Medicare Other | Admitting: Internal Medicine

## 2018-03-01 DIAGNOSIS — I25708 Atherosclerosis of coronary artery bypass graft(s), unspecified, with other forms of angina pectoris: Secondary | ICD-10-CM

## 2018-03-01 DIAGNOSIS — I5022 Chronic systolic (congestive) heart failure: Secondary | ICD-10-CM

## 2018-03-01 DIAGNOSIS — I441 Atrioventricular block, second degree: Secondary | ICD-10-CM | POA: Diagnosis not present

## 2018-03-01 NOTE — Progress Notes (Signed)
Location:  [facility] Room Number: [phone] Place of Service:  SNF [phone])  [name]. Victor Coil, MD  Patient Care Team: [name], MD as PCP - General (Internal Medicine) [name], MD as Consulting Physician (Cardiology) [name], MD as Consulting Physician (Neurology)  Extended Emergency Contact Information Primary Emergency Contact: [name],[name] Address: [address]          [place], [place] [address] [name] of [place] Phone: [phone] Mobile Phone: [phone] Relation: Spouse Secondary Emergency Contact: [name], Victor [name] of [place] Phone: [phone] Mobile Phone: [phone] Relation: Daughter    Allergies: Benzedrine [amphetamine]; Dexedrine [dextroamphetamine sulfate er]; Oxycodone; and Vancomycin  Chief Complaint  Patient presents with  . Medical Management of Chronic Issues    Routine visit    HPI: Patient is 78 y.o. 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  Past Medical History:  Diagnosis Date  . 2nd degree AV block    a. s/p STJ dual chamber pacemaker 11/2014  . Acute respiratory failure ([place]) 01/26/2015  . AKI (acute kidney injury) ([place]) 01/26/2015  . AMI, INFERIOR WALL 09/30/2008   Qualifier: Diagnosis of  By: Unk Lightning, RN, BSN, Melanie    . Benign neoplasm of colon     Adenomatous polyps  . Benign prostatic hyperplasia   . Bipolar affective disorder ([place])    . Bipolar depression ([place]) 05/10/2015  . BPH (benign prostatic hypertrophy)   . CAD (coronary artery disease)    a. s/p CABG  . CAD, ARTERY BYPASS GRAFT 11/18/2008   Qualifier: Diagnosis of  By: Angelena Form, MD, Harrell Gave    . Chronic pain    . CVA (cerebral infarction)     Small left occipital  . Dementia with behavioral disturbance ([place]) 02/15/2015  . Depression    .  Diabetic peripheral neuropathy ([place]) 09/28/2011  . DM        . DM (diabetes mellitus), type 2 with peripheral vascular complications ([place]) 6/56/8127   Qualifier: Diagnosis of  By: Burnett Kanaris    . Gout    . History of alcohol abuse    . Hyperlipidemia    . HYPERTENSION        . Hypertensive heart disease with heart failure ([place]) 09/01/2008   Qualifier: Diagnosis of  By: Burnett Kanaris    . Insomnia 04/19/2012  . Ischemic cardiomyopathy    . Narcolepsy    . Obesity   . OBESITY 09/01/2008   Qualifier: Diagnosis of  By: Burnett Kanaris    . OBSTRUCTIVE SLEEP APNEA        . Obstructive sleep apnea 09/16/2008   Qualifier: Diagnosis of  By: [name] MD, [name]   . Pacemaker 12/12/2014  . Parkinsonism ([place]) 09/30/2015  . [facility] spotted fever   . SINUS TACHYCARDIA 09/02/2008   Qualifier: Diagnosis of  By: Caryl Comes, MD, [name]   . TIA (transient ischemic attack)    . TOBACCO ABUSE 07/31/2007   Qualifier: Diagnosis of  By: [name] MD, [name]   . Tremor 08/11/2015  . UNIVERSAL ULCERATIVE COLITIS        . Vitamin D deficiency 01/07/2015    Past Surgical History:  Procedure Laterality Date  . CARDIAC CATHETERIZATION     Triple-vessel coronary artery disease. Low-normal left ventricular systolic function  with mild   anteroapical wall motion abnormality.   [name] Kitchen CARDIAC CATHETERIZATION N/A 01/28/2015   Procedure: Temporary Pacemaker;  Surgeon: [name], MD;  Location: [facility];  Service: Cardiovascular;  Laterality: N/A;  . CORONARY ARTERY BYPASS GRAFT     Median sternotomy, extracorporeal circulation,  coronary artery bypass graft surgery x4 using a sequential left internal   mammary artery graft to the mid and distal left anterior descending, a   saphenous vein graft to diagonal branch of the left anterior descending,   and a saphenous vein graft to the obtuse marginal branch of left   circumflex coronary artery.    . EP IMPLANTABLE DEVICE N/A 12/12/2014     Procedure: Pacemaker Implant;  Surgeon: [name], MD;  Location: [facility];  Service: Cardiovascular;  Laterality: N/A;  . EP IMPLANTABLE DEVICE N/A 01/28/2015   Procedure: PPM Generator Removal;  Surgeon: [name], MD;  Location: [facility];  Service: Cardiovascular;  Laterality: N/A;  . EP IMPLANTABLE DEVICE N/A 03/20/2015   Procedure: Pacemaker Implant;  Surgeon: [name], MD;  Location: [facility];  Service: Cardiovascular;  Laterality: N/A;  . FLEXIBLE SIGMOIDOSCOPY  01/17/2012   Procedure: FLEXIBLE SIGMOIDOSCOPY;  Surgeon: Lafayette Dragon, MD;  Location: [facility];  Service: Endoscopy;  Laterality: N/A;  . MULTIPLE EXTRACTIONS WITH ALVEOLOPLASTY  12/01/2011   Procedure: MULTIPLE EXTRACION WITH ALVEOLOPLASTY;  Surgeon: Lenn Cal, DDS;  Location: [facility];  Service: Oral Surgery;  Laterality: N/A;  Extraction of tooth #'s 3,4,5,6,7,8,9,10,11,12,13,14,15,16,17,20,21,22,23,24,25,26,27,28,29,30,31,32 with alveoloplasty and bilateral mandibular lingual tori  . TONSILECTOMY, ADENOIDECTOMY, BILATERAL MYRINGOTOMY AND TUBES  1948    Allergies as of 03/01/2018      Reactions   Benzedrine [amphetamine] Nausea Only   Dexedrine [dextroamphetamine Sulfate Er] Other (See Comments)   agitation   Oxycodone Nausea And Vomiting   Vancomycin    Unknown - per [facility]      Medication List       Accurate as of March 01, 2018 11:59 PM. Always use your most recent med list.        aspirin 81 MG chewable tablet Chew 81 mg by mouth at bedtime.   carbidopa-levodopa 25-100 MG tablet Commonly known as:  SINEMET IR Take 2 tablets by mouth 3 (three) times daily with meals.   carvedilol 3.125 MG tablet Commonly known as:  COREG Take 3.125 mg by mouth 2 (two) times daily with a meal. Hold for SBP less than 105   clonazePAM 0.5 MG tablet Commonly known as:  KLONOPIN Take 1 tablet (0.5 mg total) by mouth at bedtime.   docusate sodium 100 MG  capsule Commonly known as:  COLACE Take 100 mg by mouth daily as needed for mild constipation.   donepezil 10 MG tablet Commonly known as:  ARICEPT Take 10 mg by mouth at bedtime.   doxazosin 1 MG tablet Commonly known as:  CARDURA Take 1 mg by mouth at bedtime.   fenofibrate 145 MG tablet Commonly known as:  TRICOR Take 145 mg by mouth daily. With a meal   ferrous sulfate 325 (65 FE) MG tablet Take 325 mg by mouth daily with breakfast.   Fish Oil 1000 MG Caps Take 1 capsule by mouth 3 (three) times daily.   furosemide 40 MG tablet Commonly known as:  LASIX Take 40 mg by mouth daily.   gabapentin 400 MG capsule Commonly known as:  NEURONTIN Take 400 mg by mouth  3 (three) times daily.   HUMALOG KWIKPEN 100 UNIT/ML KiwkPen Generic drug:  insulin lispro Inject 20 Units into the skin 3 (three) times daily. Hold for CBG  < 200. ( to be given in addition to sliding scale) Check FSBS before meals and at bedtime. SSI 201-25 4 units, 251-300 6 units, 301-350 8 units, 351-400 10 units   lamoTRIgine 25 MG tablet Commonly known as:  LAMICTAL Take 25 mg by mouth daily. give one tab with Lamictal 100 mg to equal 125 mg daily   lamoTRIgine 100 MG tablet Commonly known as:  LAMICTAL Take 100 mg by mouth daily. Give 100 mg tablet along with 25 mg tablet to equal 125 mg total   LEXAPRO 5 MG tablet Generic drug:  escitalopram Take 5 mg by mouth daily.   lidocaine 5 % Commonly known as:  LIDODERM Place 1 patch onto the skin. Apply 1 patch to lower back on in AM. Remove patch from lower back in PM Remove & Discard patch within 12 hours or as directed by MD   losartan 50 MG tablet Commonly known as:  COZAAR Take 50 mg by mouth every morning.   Magnesium Oxide 500 MG Tabs Take 1 tablet by mouth daily.   metFORMIN 1000 MG tablet Commonly known as:  GLUCOPHAGE Take 1,000 mg by mouth 2 (two) times daily.   multivitamin tablet Take 1 tablet by mouth daily.   nitroGLYCERIN 0.4  MG SL tablet Commonly known as:  NITROSTAT Place 0.4 mg under the tongue every 5 (five) minutes as needed for chest pain.   OXYGEN Inhale 2 L into the lungs continuous. To keep spo2 above 90%   polyethylene glycol packet Commonly known as:  MIRALAX / GLYCOLAX Take 17 g by mouth daily.   pravastatin 40 MG tablet Commonly known as:  PRAVACHOL Take 40 mg by mouth at bedtime.   TOUJEO MAX SOLOSTAR 300 UNIT/ML Sopn Generic drug:  Insulin Glargine Inject 60 Units into the skin at bedtime. (PRIME PEN WITH 3 UNITS PRIOR TO EACH USE - DO NOT MIX WITH ANY OTHER INSULIN - USE WITHIN 42 DAYS AFTER OPENING   traMADol 50 MG tablet Commonly known as:  ULTRAM Take 1 tablet (50 mg total) by mouth every 8 (eight) hours. Every shift for pain   traZODone 50 MG tablet Commonly known as:  DESYREL Take 1 tablet (50 mg total) by mouth at bedtime.       No orders of the defined types were placed in this encounter.   Immunization History  Administered Date(s) Administered  . Influenza-Unspecified 10/28/2016  . PPD Test 03/20/2013  . Pneumococcal Conjugate-13 10/09/2015  . Pneumococcal Polysaccharide-23 09/02/2014    Social History   Tobacco Use  . Smoking status: Former Smoker    Packs/day: 2.00    Years: 50.00    Pack years: 100.00    Last attempt to quit: 08/18/2014    Years since quitting: 3.5  . Smokeless tobacco: Former Network engineer Use Topics  . Alcohol use: No    Comment: quit 2003    Review of Systems  DATA OBTAINED: from patient GENERAL:  no fevers, fatigue, appetite changes SKIN: No itching, rash HEENT: No complaint RESPIRATORY: No cough, wheezing, SOB CARDIAC: No chest pain, palpitations, lower extremity edema  GI: No abdominal pain, No N/V/D or constipation, No heartburn or reflux  GU: No dysuria, frequency or urgency, or incontinence  MUSCULOSKELETAL: No unrelieved bone/joint pain NEUROLOGIC: No headache, dizziness  PSYCHIATRIC: No overt anxiety  or  sadness  Vitals:   03/01/18 1407  BP: (!) 118/59  Pulse: 64  Resp: 20  Temp: 98.4 F (36.9 C)   Body mass index is 42.05 kg/m. Physical Exam  GENERAL APPEARANCE: Alert, conversant, No acute distress  SKIN: No diaphoresis rash HEENT: Unremarkable RESPIRATORY: Breathing is even, unlabored. Lung sounds are clear   CARDIOVASCULAR: Heart RRR no murmurs, rubs or gallops. 1+ peripheral edema L LE, trace right lower extremity GASTROINTESTINAL: Abdomen is soft, non-tender, not distended w/ normal bowel sounds.  GENITOURINARY: Bladder non tender, not distended  MUSCULOSKELETAL: No abnormal joints or musculature NEUROLOGIC: Cranial nerves 2-12 grossly intact. Moves all extremities PSYCHIATRIC: Mood and affect appropriate to situation with some dementia, no behavioral issues  Patient Active Problem List   Diagnosis Date Noted  . Peripheral vascular disease ([place]) 02/04/2017  . Controlled diabetes mellitus type 2 with complications ([place]) 12/75/1700  . Aspiration pneumonia ([place]) 09/30/2015  . Parkinsonism ([place]) 09/30/2015  . Tremor 08/11/2015  . Suicidal ideation 05/10/2015  . Bipolar depression ([place]) 05/10/2015  . Complete heart block ([place]) 03/20/2015  . Bradycardia 03/14/2015  . Dementia with behavioral disturbance ([place]) 02/15/2015  . Staphylococcus aureus bacteremia with sepsis ([place]) 01/28/2015  . Infection of pacemaker pocket ([place]) 01/28/2015  . Acute respiratory failure ([place]) 01/26/2015  . AKI (acute kidney injury) ([place]) 01/26/2015  . Acute encephalopathy 01/26/2015  . Altered mental status   . Vitamin D deficiency 01/07/2015  . Mobitz type 2 second degree atrioventricular block 12/12/2014  . Pacemaker 12/12/2014  . Symptomatic bradycardia 12/03/2014  . Lower back pain 06/12/2012  . Right hip pain 06/12/2012  . Intertrigo 05/09/2012  . Insomnia 04/19/2012  . General weakness 03/07/2012  . 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 Diarrhea 01/17/2012  .  Pyelonephritis 01/15/2012  . Diabetic peripheral neuropathy ([place]) 09/28/2011  . Urinary incontinence 09/28/2011  . Preventative health care 09/23/2011  . Gout 09/23/2011  . Bipolar affective disorder ([place]) 09/23/2011  . TIA (transient ischemic attack) 09/23/2011  . Chronic pain 09/23/2011  . DNR (do not resuscitate) 09/23/2011  . Cerebral infarction ([place]) 09/23/2011  . 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 History of alcohol abuse 09/23/2011  . Benign prostatic hyperplasia   . Left ventricular systolic dysfunction   . Chronic systolic CHF (congestive heart failure) ([place]) 05/05/2011  . Edema 01/03/2011  . CAD (coronary artery disease) 05/18/2010  . Hyperlipidemia 05/18/2010  . Other abnormality of urination(788.69) 11/25/2009  . Other specified forms of chronic ischemic heart disease 05/19/2009  . CAD, ARTERY BYPASS GRAFT 11/18/2008  . AMI, INFERIOR WALL 09/30/2008  . Obstructive sleep apnea 09/16/2008  . SINUS TACHYCARDIA 09/02/2008  . DM (diabetes mellitus), type 2 with peripheral vascular complications ([place]) 17/49/4496  . OBESITY 09/01/2008  . Hypertensive heart disease with heart failure ([place]) 09/01/2008  . TOBACCO ABUSE 07/31/2007  . 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    CMP     Component Value Date/Time   NA 144 01/23/2018   K 4.5 01/23/2018   CL 102 03/20/2015 1038   CO2 28 03/20/2015 1038   GLUCOSE 171 (H) 03/20/2015 1038   BUN 65 (A) 01/23/2018   CREATININE 2.7 (A) 01/23/2018   CREATININE 1.23 03/20/2015 1038   CREATININE 1.06 12/05/2014 1536   CALCIUM 9.6 03/20/2015 1038   PROT 5.8 (L) 02/01/2015 0245   ALBUMIN 2.2 (L) 02/01/2015 0245   AST 17 02/11/2017   ALT 6 (A) 02/11/2017   ALKPHOS 21 (A) 02/11/2017   BILITOT 0.4 02/01/2015 0245  GFRNONAA 55 (L) 03/20/2015 1038   GFRAA >60 03/20/2015 1038   Recent Labs    01/23/18  NA 144  K 4.5  BUN 65*  CREATININE 2.7*   No results for input(s): AST, ALT, ALKPHOS, BILITOT, PROT, ALBUMIN in the last 8760  hours. No results for input(s): WBC, NEUTROABS, HGB, HCT, MCV, PLT in the last 8760 hours. Recent Labs    01/23/18  CHOL 165  LDLCALC 63  TRIG 311*   Lab Results  Component Value Date   MICROALBUR >42.6 04/09/2016   Lab Results  Component Value Date   TSH 1.47 01/23/2018   Lab Results  Component Value Date   HGBA1C 7.6 01/23/2018   Lab Results  Component Value Date   CHOL 165 01/23/2018   HDL 39 01/23/2018   LDLCALC 63 01/23/2018   LDLDIRECT 85.2 06/13/2012   TRIG 311 (A) 01/23/2018   CHOLHDL 7 06/13/2012    Significant Diagnostic Results in last 30 days:  No results found.  Assessment and Plan  CAD (coronary artery disease) No chest pain reported; continue Coreg 3.125 mg twice daily, ASA 81 mg daily and PRN sublingual nitroglycerin; patient is on statin  Chronic systolic CHF (congestive heart failure) (HCC) No reported exacerbations; continue Lasix 40 mg daily, Cozaar 25 mg daily and Coreg 3.125 mg twice daily  Mobitz type 2 second degree atrioventricular block Patient with permanent pacemaker; no reported problems; continue supportive care     Victor Pelzer D. Victor Coil, MD

## 2018-03-03 ENCOUNTER — Encounter: Payer: Self-pay | Admitting: Internal Medicine

## 2018-03-03 NOTE — Assessment & Plan Note (Signed)
Patient with permanent pacemaker; no reported problems; continue supportive care

## 2018-03-03 NOTE — Assessment & Plan Note (Signed)
No chest pain reported; continue Coreg 3.125 mg twice daily, ASA 81 mg daily and PRN sublingual nitroglycerin; patient is on statin

## 2018-03-03 NOTE — Assessment & Plan Note (Signed)
No reported exacerbations; continue Lasix 40 mg daily, Cozaar 25 mg daily and Coreg 3.125 mg twice daily

## 2018-03-22 ENCOUNTER — Other Ambulatory Visit: Payer: Self-pay

## 2018-03-22 MED ORDER — TRAMADOL HCL 50 MG PO TABS: 50.0000 mg | ORAL_TABLET | Freq: Three times a day (TID) | ORAL | 0 refills | Status: DC

## 2018-03-29 ENCOUNTER — Non-Acute Institutional Stay (SKILLED_NURSING_FACILITY): Payer: Medicare Other | Admitting: Internal Medicine

## 2018-03-29 ENCOUNTER — Encounter: Payer: Self-pay | Admitting: Internal Medicine

## 2018-03-29 DIAGNOSIS — E118 Type 2 diabetes mellitus with unspecified complications: Secondary | ICD-10-CM

## 2018-03-29 DIAGNOSIS — I739 Peripheral vascular disease, unspecified: Secondary | ICD-10-CM | POA: Diagnosis not present

## 2018-03-29 DIAGNOSIS — Z794 Long term (current) use of insulin: Secondary | ICD-10-CM

## 2018-03-29 DIAGNOSIS — I110 Hypertensive heart disease with heart failure: Secondary | ICD-10-CM | POA: Diagnosis not present

## 2018-03-29 NOTE — Progress Notes (Signed)
Location:  [facility] Room Number: [phone] Place of Service:  SNF (31)  [name], MD  Patient Care Team: [name], MD as PCP - General (Internal Medicine) [name], MD as Consulting Physician (Cardiology) [name], MD as Consulting Physician (Neurology)  Extended Emergency Contact Information Primary Emergency Contact: [name],[name] Address: [address]          [place], [place] [address] [name] of [place] Phone: [phone] Mobile Phone: [phone] Relation: Spouse Secondary Emergency Contact: [name], [name] of [place] Phone: [phone] Mobile Phone: [phone] Relation: Daughter    Allergies: Benzedrine [amphetamine]; Dexedrine [dextroamphetamine sulfate er]; Oxycodone; and Vancomycin  Chief Complaint  Patient presents with  . Medical Management of Chronic Issues    Routine visit    HPI: Patient is 78 y.o. male who is being seen for routine issues of hypertension, P PVD, and diabetes mellitus type 2.  Past Medical History:  Diagnosis Date  . 2nd degree AV block    a. s/p STJ dual chamber pacemaker 11/2014  . Acute respiratory failure ([place]) 01/26/2015  . AKI (acute kidney injury) ([place]) 01/26/2015  . AMI, INFERIOR WALL 09/30/2008   Qualifier: Diagnosis of  By: Unk Lightning, RN, BSN, Melanie    . Benign neoplasm of colon     Adenomatous polyps  . Benign prostatic hyperplasia   . Bipolar affective disorder ([place])    . Bipolar depression ([place]) 05/10/2015  . BPH (benign prostatic hypertrophy)   . CAD (coronary artery disease)    a. s/p CABG  . CAD, ARTERY BYPASS GRAFT 11/18/2008   Qualifier: Diagnosis of  By: Angelena Form, MD, Harrell Gave    . Chronic pain    . CVA (cerebral infarction)     Small left occipital  . Dementia with behavioral disturbance ([place]) 02/15/2015  . Depression    . Diabetic peripheral neuropathy ([place]) 09/28/2011  . DM        . DM  (diabetes mellitus), type 2 with peripheral vascular complications ([place]) 4/33/2951   Qualifier: Diagnosis of  By: Burnett Kanaris    . Gout    . History of alcohol abuse    . Hyperlipidemia    . HYPERTENSION        . Hypertensive heart disease with heart failure ([place]) 09/01/2008   Qualifier: Diagnosis of  By: Burnett Kanaris    . Insomnia 04/19/2012  . Ischemic cardiomyopathy    . Narcolepsy    . Obesity   . OBESITY 09/01/2008   Qualifier: Diagnosis of  By: Burnett Kanaris    . OBSTRUCTIVE SLEEP APNEA        . Obstructive sleep apnea 09/16/2008   Qualifier: Diagnosis of  By: [name] MD, [name]   . Pacemaker 12/12/2014  . Parkinsonism ([place]) 09/30/2015  . [facility] spotted fever   . SINUS TACHYCARDIA 09/02/2008   Qualifier: Diagnosis of  By: Caryl Comes, MD, [name]   . TIA (transient ischemic attack)    . TOBACCO ABUSE 07/31/2007   Qualifier: Diagnosis of  By: [name] MD, [name]   . Tremor 08/11/2015  . UNIVERSAL ULCERATIVE COLITIS        . Vitamin D deficiency 01/07/2015    Past Surgical History:  Procedure Laterality Date  . CARDIAC CATHETERIZATION     Triple-vessel coronary artery disease. Low-normal left ventricular systolic function with mild   anteroapical wall motion abnormality.   [name] Kitchen  CARDIAC CATHETERIZATION N/A 01/28/2015   Procedure: Temporary Pacemaker;  Surgeon: [name], MD;  Location: [facility];  Service: Cardiovascular;  Laterality: N/A;  . CORONARY ARTERY BYPASS GRAFT     Median sternotomy, extracorporeal circulation,  coronary artery bypass graft surgery x4 using a sequential left internal   mammary artery graft to the mid and distal left anterior descending, a   saphenous vein graft to diagonal branch of the left anterior descending,   and a saphenous vein graft to the obtuse marginal branch of left   circumflex coronary artery.    . EP IMPLANTABLE DEVICE N/A 12/12/2014   Procedure: Pacemaker Implant;  Surgeon: [name], MD;   Location: [facility];  Service: Cardiovascular;  Laterality: N/A;  . EP IMPLANTABLE DEVICE N/A 01/28/2015   Procedure: PPM Generator Removal;  Surgeon: [name], MD;  Location: [facility];  Service: Cardiovascular;  Laterality: N/A;  . EP IMPLANTABLE DEVICE N/A 03/20/2015   Procedure: Pacemaker Implant;  Surgeon: [name], MD;  Location: [facility];  Service: Cardiovascular;  Laterality: N/A;  . FLEXIBLE SIGMOIDOSCOPY  01/17/2012   Procedure: FLEXIBLE SIGMOIDOSCOPY;  Surgeon: [place] Dragon, MD;  Location: [facility];  Service: Endoscopy;  Laterality: N/A;  . MULTIPLE EXTRACTIONS WITH ALVEOLOPLASTY  12/01/2011   Procedure: MULTIPLE EXTRACION WITH ALVEOLOPLASTY;  Surgeon: Lenn Cal, DDS;  Location: [facility];  Service: Oral Surgery;  Laterality: N/A;  Extraction of tooth #'s 3,4,5,6,7,8,9,10,11,12,13,14,15,16,17,20,21,22,23,24,25,26,27,28,29,30,31,32 with alveoloplasty and bilateral mandibular lingual tori  . TONSILECTOMY, ADENOIDECTOMY, BILATERAL MYRINGOTOMY AND TUBES  1948    Allergies as of 03/29/2018      Reactions   Benzedrine [amphetamine] Nausea Only   Dexedrine [dextroamphetamine Sulfate Er] Other (See Comments)   agitation   Oxycodone Nausea And Vomiting   Vancomycin    Unknown - per [facility]      Medication List       Accurate as of March 29, 2018 11:59 PM. Always use your most recent med list.        aspirin 81 MG chewable tablet Chew 81 mg by mouth at bedtime.   carbidopa-levodopa 25-100 MG tablet Commonly known as:  SINEMET IR Take 2 tablets by mouth 3 (three) times daily with meals.   carvedilol 3.125 MG tablet Commonly known as:  COREG Take 3.125 mg by mouth 2 (two) times daily with a meal. Hold for SBP less than 105   clonazePAM 0.5 MG tablet Commonly known as:  KLONOPIN Take 1 tablet (0.5 mg total) by mouth at bedtime.   docusate sodium 100 MG capsule Commonly known as:  COLACE Take 100 mg by mouth daily as needed for mild  constipation.   donepezil 10 MG tablet Commonly known as:  ARICEPT Take 10 mg by mouth at bedtime.   doxazosin 1 MG tablet Commonly known as:  CARDURA Take 1 mg by mouth at bedtime.   fenofibrate 145 MG tablet Commonly known as:  TRICOR Take 145 mg by mouth daily. With a meal   ferrous sulfate 325 (65 FE) MG tablet Take 325 mg by mouth daily with breakfast.   Fish Oil 1000 MG Caps Take 1 capsule by mouth 3 (three) times daily.   furosemide 40 MG tablet Commonly known as:  LASIX Take 40 mg by mouth daily.   gabapentin 400 MG capsule Commonly known as:  NEURONTIN Take 400 mg by mouth 3 (three) times daily.   HumaLOG KwikPen 100 UNIT/ML KiwkPen Generic  drug:  insulin lispro Inject 20 Units into the skin 3 (three) times daily. Hold for CBG  < 200. ( to be given in addition to sliding scale) Check FSBS before meals and at bedtime. SSI 201-25 4 units, 251-300 6 units, 301-350 8 units, 351-400 10 units   lamoTRIgine 25 MG tablet Commonly known as:  LAMICTAL Take 25 mg by mouth daily. give one tab with Lamictal 100 mg to equal 125 mg daily   lamoTRIgine 100 MG tablet Commonly known as:  LAMICTAL Take 100 mg by mouth daily. Give 100 mg tablet along with 25 mg tablet to equal 125 mg total   Lexapro 5 MG tablet Generic drug:  escitalopram Take 5 mg by mouth daily.   lidocaine 5 % Commonly known as:  LIDODERM Place 1 patch onto the skin. Apply 1 patch to lower back on in AM. Remove patch from lower back in PM Remove & Discard patch within 12 hours or as directed by MD   losartan 50 MG tablet Commonly known as:  COZAAR Take 50 mg by mouth every morning.   Magnesium Oxide 500 MG Tabs Take 1 tablet by mouth daily.   metFORMIN 1000 MG tablet Commonly known as:  GLUCOPHAGE Take 1,000 mg by mouth 2 (two) times daily.   multivitamin tablet Take 1 tablet by mouth daily.   nitroGLYCERIN 0.4 MG SL tablet Commonly known as:  NITROSTAT Place 0.4 mg under the tongue every 5  (five) minutes as needed for chest pain.   OXYGEN Inhale 2 L into the lungs continuous. To keep spo2 above 90%   oxymetazoline 0.05 % nasal spray Commonly known as:  AFRIN Place 1 spray into both nostrils. SOAK COTTON BALL AND INSERT IN NOSE AS NEEDED FOR NOSE BLEED   polyethylene glycol packet Commonly known as:  MIRALAX / GLYCOLAX Take 17 g by mouth daily.   pravastatin 40 MG tablet Commonly known as:  PRAVACHOL Take 40 mg by mouth at bedtime.   primidone 50 MG tablet Commonly known as:  MYSOLINE Take 25 mg by mouth. GIVE HALF TABLET (25MG ) BY MOUTH AT BEDTIME FOR ESSENTIAL TREMORS   sodium chloride 0.65 % Soln nasal spray Commonly known as:  OCEAN Place 1 spray into both nostrils as needed for congestion. Apply one spray to each nostril three times daily x 8 weeks for dry nasal mucus membranes   Toujeo Max SoloStar 300 UNIT/ML Sopn Generic drug:  Insulin Glargine Inject 60 Units into the skin at bedtime. (PRIME PEN WITH 3 UNITS PRIOR TO EACH USE - DO NOT MIX WITH ANY OTHER INSULIN - USE WITHIN 42 DAYS AFTER OPENING   traMADol 50 MG tablet Commonly known as:  ULTRAM Take 1 tablet (50 mg total) by mouth every 8 (eight) hours. Every shift for pain   traZODone 50 MG tablet Commonly known as:  DESYREL Take 1 tablet (50 mg total) by mouth at bedtime.       No orders of the defined types were placed in this encounter.   Immunization History  Administered Date(s) Administered  . Influenza-Unspecified 10/28/2016  . PPD Test 03/20/2013  . Pneumococcal Conjugate-13 10/09/2015  . Pneumococcal Polysaccharide-23 09/02/2014    Social History   Tobacco Use  . Smoking status: Former Smoker    Packs/day: 2.00    Years: 50.00    Pack years: 100.00    Last attempt to quit: 08/18/2014    Years since quitting: 3.6  . Smokeless tobacco: Former Network engineer Use Topics  .  Alcohol use: No    Comment: quit 2003    Review of Systems  DATA OBTAINED: from patient- no  complaints; nursing-no acute concerns GENERAL:  no fevers, fatigue, appetite changes SKIN: No itching, rash HEENT: No complaint RESPIRATORY: No cough, wheezing, SOB CARDIAC: No chest pain, palpitations, lower extremity edema  GI: No abdominal pain, No N/V/D or constipation, No heartburn or reflux  GU: No dysuria, frequency or urgency, or incontinence  MUSCULOSKELETAL: No unrelieved bone/joint pain NEUROLOGIC: No headache, dizziness  PSYCHIATRIC: No overt anxiety or sadness  Vitals:   03/30/18 1020  BP: 140/76  Pulse: 86  Resp: 19  Temp: 98.6 F (37 C)   Body mass index is 42.64 kg/m. Physical Exam  GENERAL APPEARANCE: Alert, conversant, No acute distress  SKIN: No diaphoresis rash HEENT: Unremarkable RESPIRATORY: Breathing is even, unlabored. Lung sounds are clear   CARDIOVASCULAR: Heart RRR no murmurs, rubs or gallops. No peripheral edema  GASTROINTESTINAL: Abdomen is soft, non-tender, not distended w/ normal bowel sounds.  GENITOURINARY: Bladder non tender, not distended  MUSCULOSKELETAL: No abnormal joints or musculature NEUROLOGIC: Cranial nerves 2-12 grossly intact. Moves all extremities only with stiffness PSYCHIATRIC: Mood and affect flat, no behavioral issues  Patient Active Problem List   Diagnosis Date Noted  . Peripheral vascular disease ([place]) 02/04/2017  . Controlled diabetes mellitus type 2 with complications ([place]) 96/29/5284  . Aspiration pneumonia ([place]) 09/30/2015  . Parkinsonism ([place]) 09/30/2015  . Tremor 08/11/2015  . Suicidal ideation 05/10/2015  . Bipolar depression ([place]) 05/10/2015  . Complete heart block ([place]) 03/20/2015  . Bradycardia 03/14/2015  . Dementia with behavioral disturbance ([place]) 02/15/2015  . Staphylococcus aureus bacteremia with sepsis ([place]) 01/28/2015  . Infection of pacemaker pocket ([place]) 01/28/2015  . Acute respiratory failure ([place]) 01/26/2015  . AKI (acute kidney injury) ([place]) 01/26/2015  . Acute encephalopathy 01/26/2015  .  Altered mental status   . Vitamin D deficiency 01/07/2015  . Mobitz type 2 second degree atrioventricular block 12/12/2014  . Pacemaker 12/12/2014  . Symptomatic bradycardia 12/03/2014  . Lower back pain 06/12/2012  . Right hip pain 06/12/2012  . Intertrigo 05/09/2012  . Insomnia 04/19/2012  . General weakness 03/07/2012  . 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 Diarrhea 01/17/2012  . Pyelonephritis 01/15/2012  . Diabetic peripheral neuropathy ([place]) 09/28/2011  . Urinary incontinence 09/28/2011  . Preventative health care 09/23/2011  . Gout 09/23/2011  . Bipolar affective disorder ([place]) 09/23/2011  . TIA (transient ischemic attack) 09/23/2011  . Chronic pain 09/23/2011  . DNR (do not resuscitate) 09/23/2011  . Cerebral infarction ([place]) 09/23/2011  . 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 History of alcohol abuse 09/23/2011  . Benign prostatic hyperplasia   . Left ventricular systolic dysfunction   . Chronic systolic CHF (congestive heart failure) ([place]) 05/05/2011  . Edema 01/03/2011  . CAD (coronary artery disease) 05/18/2010  . Hyperlipidemia 05/18/2010  . Other abnormality of urination(788.69) 11/25/2009  . Other specified forms of chronic ischemic heart disease 05/19/2009  . CAD, ARTERY BYPASS GRAFT 11/18/2008  . AMI, INFERIOR WALL 09/30/2008  . Obstructive sleep apnea 09/16/2008  . SINUS TACHYCARDIA 09/02/2008  . DM (diabetes mellitus), type 2 with peripheral vascular complications ([place]) 13/24/4010  . OBESITY 09/01/2008  . Hypertensive heart disease with heart failure ([place]) 09/01/2008  . TOBACCO ABUSE 07/31/2007  . 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    CMP     Component Value Date/Time   NA 144 01/23/2018   K 4.5 01/23/2018   CL 102 03/20/2015 1038  CO2 28 03/20/2015 1038   GLUCOSE 171 (H) 03/20/2015 1038   BUN 65 (A) 01/23/2018   CREATININE 2.7 (A) 01/23/2018   CREATININE 1.23 03/20/2015 1038   CREATININE 1.06 12/05/2014 1536   CALCIUM 9.6  03/20/2015 1038   PROT 5.8 (L) 02/01/2015 0245   ALBUMIN 2.2 (L) 02/01/2015 0245   AST 17 02/11/2017   ALT 6 (A) 02/11/2017   ALKPHOS 21 (A) 02/11/2017   BILITOT 0.4 02/01/2015 0245   GFRNONAA 55 (L) 03/20/2015 1038   GFRAA >60 03/20/2015 1038   Recent Labs    01/23/18  NA 144  K 4.5  BUN 65*  CREATININE 2.7*   No results for input(s): AST, ALT, ALKPHOS, BILITOT, PROT, ALBUMIN in the last 8760 hours. No results for input(s): WBC, NEUTROABS, HGB, HCT, MCV, PLT in the last 8760 hours. Recent Labs    01/23/18  CHOL 165  LDLCALC 63  TRIG 311*   Lab Results  Component Value Date   MICROALBUR >42.6 04/09/2016   Lab Results  Component Value Date   TSH 1.47 01/23/2018   Lab Results  Component Value Date   HGBA1C 7.6 01/23/2018   Lab Results  Component Value Date   CHOL 165 01/23/2018   HDL 39 01/23/2018   LDLCALC 63 01/23/2018   LDLDIRECT 85.2 06/13/2012   TRIG 311 (A) 01/23/2018   CHOLHDL 7 06/13/2012    Significant Diagnostic Results in last 30 days:  No results found.  Assessment and Plan  Hypertensive heart disease with heart failure ([place]) Controlled today; continue multiple agents including Coreg 3.125 mg twice daily, Lasix 40 mg daily, Cardura 1 mg nightly, and Cozaar 50 mg daily  Peripheral vascular disease ([place]) ; Continue ASA 81 mg daily, patient is on statin this week as A1c 7.6 which is really good for this patient; continue supportive care yet  Controlled diabetes mellitus type 2 with complications ([place]) Most recent A1c 7.6 which is nothing short of a miracle for this patient; patient is on Toujeo 60 units subcu daily, 20 units of regular with each meal plus sliding scale and Glucophage 2000 mg twice daily; patient is on statin patient is on arm      Inocencio Homes, MD

## 2018-03-30 ENCOUNTER — Encounter: Payer: Self-pay | Admitting: Internal Medicine

## 2018-04-01 ENCOUNTER — Encounter: Payer: Self-pay | Admitting: Internal Medicine

## 2018-04-01 NOTE — Assessment & Plan Note (Signed)
Controlled today; continue multiple agents including Coreg 3.125 mg twice daily, Lasix 40 mg daily, Cardura 1 mg nightly, and Cozaar 50 mg daily

## 2018-04-01 NOTE — Assessment & Plan Note (Signed)
Most recent A1c 7.6 which is nothing short of a miracle for this patient; patient is on Toujeo 60 units subcu daily, 20 units of regular with each meal plus sliding scale and Glucophage 2000 mg twice daily; patient is on statin patient is on arm

## 2018-04-01 NOTE — Assessment & Plan Note (Signed)
;   Continue ASA 81 mg daily, patient is on statin this week as A1c 7.6 which is really good for this patient; continue supportive care yet

## 2018-04-23 ENCOUNTER — Encounter: Payer: Self-pay | Admitting: Adult Health

## 2018-04-23 ENCOUNTER — Non-Acute Institutional Stay (SKILLED_NURSING_FACILITY): Payer: Medicare Other | Admitting: Adult Health

## 2018-04-23 DIAGNOSIS — I129 Hypertensive chronic kidney disease with stage 1 through stage 4 chronic kidney disease, or unspecified chronic kidney disease: Secondary | ICD-10-CM

## 2018-04-23 DIAGNOSIS — M545 Low back pain, unspecified: Secondary | ICD-10-CM

## 2018-04-23 DIAGNOSIS — E1142 Type 2 diabetes mellitus with diabetic polyneuropathy: Secondary | ICD-10-CM

## 2018-04-23 DIAGNOSIS — G250 Essential tremor: Secondary | ICD-10-CM

## 2018-04-23 DIAGNOSIS — F0281 Dementia in other diseases classified elsewhere with behavioral disturbance: Secondary | ICD-10-CM

## 2018-04-23 DIAGNOSIS — J9611 Chronic respiratory failure with hypoxia: Secondary | ICD-10-CM

## 2018-04-23 DIAGNOSIS — N183 Chronic kidney disease, stage 3 unspecified: Secondary | ICD-10-CM

## 2018-04-23 DIAGNOSIS — E785 Hyperlipidemia, unspecified: Secondary | ICD-10-CM

## 2018-04-23 DIAGNOSIS — I5022 Chronic systolic (congestive) heart failure: Secondary | ICD-10-CM

## 2018-04-23 DIAGNOSIS — G300 Alzheimer's disease with early onset: Secondary | ICD-10-CM

## 2018-04-23 DIAGNOSIS — I25709 Atherosclerosis of coronary artery bypass graft(s), unspecified, with unspecified angina pectoris: Secondary | ICD-10-CM

## 2018-04-23 DIAGNOSIS — I130 Hypertensive heart and chronic kidney disease with heart failure and stage 1 through stage 4 chronic kidney disease, or unspecified chronic kidney disease: Secondary | ICD-10-CM

## 2018-04-23 DIAGNOSIS — G218 Other secondary parkinsonism: Secondary | ICD-10-CM

## 2018-04-23 DIAGNOSIS — I442 Atrioventricular block, complete: Secondary | ICD-10-CM

## 2018-04-23 DIAGNOSIS — F3175 Bipolar disorder, in partial remission, most recent episode depressed: Secondary | ICD-10-CM

## 2018-04-23 DIAGNOSIS — E1122 Type 2 diabetes mellitus with diabetic chronic kidney disease: Secondary | ICD-10-CM

## 2018-04-23 DIAGNOSIS — K5909 Other constipation: Secondary | ICD-10-CM

## 2018-04-23 DIAGNOSIS — E1169 Type 2 diabetes mellitus with other specified complication: Secondary | ICD-10-CM

## 2018-04-23 DIAGNOSIS — G8929 Other chronic pain: Secondary | ICD-10-CM

## 2018-04-23 NOTE — Progress Notes (Signed)
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     HPI:  He is a 78 year old long term resident of this facility being seen for the management of his chronic illnesses; hypertensive heart disease; systolic heart failure; cad. He denies any uncontrolled pain; no changes in appetite; no anxiety or depressive thoughts. No reports of fevers present.   Past Medical History:  Diagnosis Date   2nd degree AV block    a. s/p STJ dual chamber pacemaker 11/2014   Acute respiratory failure ([place]) 01/26/2015   AKI (acute kidney injury) ([place]) 01/26/2015   AMI, INFERIOR WALL 09/30/2008   Qualifier: Diagnosis of  By: Unk Lightning, RN, BSN, Melanie     Benign neoplasm of colon     Adenomatous polyps   Benign prostatic hyperplasia    Bipolar affective disorder ([place])     Bipolar depression ([place]) 05/10/2015   BPH (benign prostatic hypertrophy)    CAD (coronary artery disease)    a. s/p CABG   CAD, ARTERY BYPASS GRAFT 11/18/2008   Qualifier: Diagnosis of  By: Angelena Form, MD, Christopher     Chronic pain     CVA (cerebral infarction)     Small left occipital   Dementia with behavioral disturbance ([place]) 02/15/2015   Depression     Diabetic peripheral neuropathy ([place]) 09/28/2011   DM         DM (diabetes mellitus), type 2 with peripheral vascular  complications ([place]) 04/25/8117   Qualifier: Diagnosis of  By: Burnett Kanaris     Gout     History of alcohol abuse     Hyperlipidemia     HYPERTENSION         Hypertensive heart disease with heart failure ([place]) 09/01/2008   Qualifier: Diagnosis of  By: Burnett Kanaris     Insomnia 04/19/2012   Ischemic cardiomyopathy     Narcolepsy     Obesity    OBESITY 09/01/2008   Qualifier: Diagnosis of  By: Barnes, [place]         Obstructive sleep apnea 09/16/2008   Qualifier: Diagnosis of  By: [name] MD, [name]    Pacemaker 12/12/2014   Parkinsonism ([place]) 09/30/2015   [place] spotted fever    SINUS TACHYCARDIA 09/02/2008   Qualifier: Diagnosis of  By: Caryl Comes, MD, Remus Blake    TIA (transient ischemic attack)     TOBACCO ABUSE 07/31/2007   Qualifier: Diagnosis of  By: [name] MD, [name]    Tremor 08/11/2015   UNIVERSAL ULCERATIVE COLITIS         Vitamin D deficiency 01/07/2015    Past Surgical History:  Procedure Laterality Date   CARDIAC CATHETERIZATION     Triple-vessel coronary artery disease. Low-normal left ventricular systolic function with mild   anteroapical wall motion abnormality.  CARDIAC CATHETERIZATION N/A 01/28/2015   Procedure: Temporary Pacemaker;  Surgeon: [name], MD;  Location: [place] [facility];  Service: Cardiovascular;  Laterality: N/A;   CORONARY ARTERY BYPASS GRAFT     Median sternotomy, extracorporeal circulation,  coronary artery bypass graft surgery x4 using a sequential left internal   mammary artery graft to the mid and distal left anterior descending, a   saphenous vein graft to diagonal branch of the left anterior descending,   and a saphenous vein graft to the obtuse marginal branch of left   circumflex coronary artery.     EP IMPLANTABLE DEVICE N/A 12/12/2014   Procedure: Pacemaker Implant;  Surgeon: [name], MD;  Location: [place] [facility];  Service: Cardiovascular;   Laterality: N/A;   EP IMPLANTABLE DEVICE N/A 01/28/2015   Procedure: PPM Generator Removal;  Surgeon: [name], MD;  Location: [place] [facility];  Service: Cardiovascular;  Laterality: N/A;   EP IMPLANTABLE DEVICE N/A 03/20/2015   Procedure: Pacemaker Implant;  Surgeon: [name], MD;  Location: [place] [facility];  Service: Cardiovascular;  Laterality: N/A;   FLEXIBLE SIGMOIDOSCOPY  01/17/2012   Procedure: FLEXIBLE SIGMOIDOSCOPY;  Surgeon: Lafayette Dragon, MD;  Location: [facility];  Service: Endoscopy;  Laterality: N/A;   MULTIPLE EXTRACTIONS WITH ALVEOLOPLASTY  12/01/2011   Procedure: MULTIPLE EXTRACION WITH ALVEOLOPLASTY;  Surgeon: Lenn Cal, DDS;  Location: [place];  Service: Oral Surgery;  Laterality: N/A;  Extraction of tooth #'s 3,4,5,6,7,8,9,10,11,12,13,14,15,16,17,20,21,22,23,24,25,26,27,28,29,30,31,32 with alveoloplasty and bilateral mandibular lingual tori   TONSILECTOMY, ADENOIDECTOMY, BILATERAL MYRINGOTOMY AND TUBES  1946    Social History   Socioeconomic History   Marital status: Married    Spouse name: [name]   Number of children: 2   Years of education: 12   Highest education level: Not on file  Occupational History   Occupation: retired Armed forces technical officer: RETIRED    Comment: body shop  Scientist, product/process development strain: Not hard at all   Food insecurity:    Worry: Never true    Inability: Never true   Transportation needs:    Medical: No    Non-medical: No  Tobacco Use   Smoking status: Former Smoker    Packs/day: 2.00    Years: 50.00    Pack years: 100.00    Last attempt to quit: 08/18/2014    Years since quitting: 3.6   Smokeless tobacco: Former Systems developer  Substance and Sexual Activity   Alcohol use: No    Comment: quit 2003   Drug use: No   Sexual activity: Never  Lifestyle   Physical activity:    Days per week: 0 days    Minutes per session: 0 min   Stress: To some extent  Relationships   Social  connections:    Talks on phone: Once a week    Gets together: Once a week    Attends religious service: Never    Active member of club or organization: No    Attends meetings of clubs or organizations: Never    Relationship status: Married   Intimate partner violence:    Fear of current or ex partner: No    Emotionally abused: No    Physically abused: No    Forced sexual activity: No  Other Topics Concern   Not on file  Social History Narrative   Patient has been married for 51 years.- [name]   Patient presents with his wife today.  Patient has 2 grown children and lives in [place] and [place] areas respectively.   Patient is a nonsmoker, stopped smoking 2016    nondrinker at this time, quit 2003   09/11/15 lives at [facility] since 09/02/14   Caffeine- 1 soda daily   DNR   Family History  Problem Relation Age of Onset   Diabetes Father    Heart disease Father    Stroke Father    Heart attack Mother    Aortic aneurysm Mother    Alcohol abuse Other    Arthritis Other    Heart disease Other    Mental illness Other    Diabetes Other    Alcohol abuse Other    Arthritis Other    Hyperlipidemia Other    Heart disease Other    Stroke Other    Hypertension Other    Diabetes Other    Mental illness Other    Heart disease Brother       VITAL SIGNS BP 112/76    Pulse 66    Temp 98.4 F (36.9 C)    Resp 18    Ht 5\' 4"  (1.626 m)    Wt 242 lb 4.8 oz (109.9 kg)    BMI 41.59 kg/m   Outpatient Encounter Medications as of 04/23/2018  Medication Sig   aspirin 81 MG chewable tablet Chew 81 mg by mouth at bedtime.    carbidopa-levodopa (SINEMET IR) 25-100 MG tablet Take 2 tablets by mouth 3 (three) times daily with meals.    carvedilol (COREG) 3.125 MG tablet Take 3.125 mg by mouth 2 (two) times daily with a meal. Hold for SBP less than 105    clonazePAM (KLONOPIN) 0.5 MG tablet Take 1 tablet (0.5 mg total) by mouth at bedtime.    docusate sodium (COLACE) 100 MG capsule Take 100 mg by mouth daily as needed for mild constipation.   donepezil (ARICEPT) 10 MG tablet Take 10 mg by mouth at bedtime.   doxazosin (CARDURA) 1 MG tablet Take 1 mg by mouth at bedtime.   escitalopram (LEXAPRO) 5 MG tablet Take 5 mg by mouth daily.   fenofibrate (TRICOR) 145 MG tablet Take 145 mg by mouth daily. With a meal   ferrous sulfate 325 (65 FE) MG tablet Take 325 mg by mouth daily with breakfast.   furosemide (LASIX) 40 MG tablet Take 40 mg by mouth daily.   gabapentin (NEURONTIN) 400 MG capsule Take 400 mg by mouth 3 (three) times daily.    Insulin Glargine (TOUJEO MAX SOLOSTAR) 300 UNIT/ML SOPN Inject 60 Units into the skin at bedtime. (PRIME PEN WITH 3 UNITS PRIOR TO EACH USE - DO NOT MIX WITH ANY OTHER INSULIN - USE WITHIN 42 DAYS AFTER OPENING   insulin lispro (HUMALOG KWIKPEN) 100 UNIT/ML KiwkPen Inject 20 Units into the skin 3 (three) times daily. Hold for CBG  < 200. ( to be given in addition to sliding scale) Check FSBS before meals and at bedtime. SSI 201-25 4 units, 251-300 6 units, 301-350 8 units, 351-400 10 units   lamoTRIgine (LAMICTAL) 100 MG tablet Take 100 mg by mouth daily. Give 100 mg tablet along with 25 mg tablet to equal 125 mg total   lamoTRIgine (LAMICTAL) 25 MG tablet Take 25 mg by mouth daily. give one tab with Lamictal 100 mg to equal 125 mg daily   lidocaine (LIDODERM) 5 % Place 1 patch onto the skin. Apply 1 patch to lower back on in AM.  Remove patch from lower back in PM Remove & Discard patch within 12 hours or as directed by MD   losartan (COZAAR) 50 MG tablet Take 50 mg by mouth every morning.    Magnesium Oxide 500 MG TABS Take 1 tablet by mouth daily.   metFORMIN (GLUCOPHAGE) 1000 MG tablet Take 1,000 mg by mouth 2 (two) times daily.    Multiple Vitamin (MULTIVITAMIN) tablet Take 1 tablet by mouth daily.   nitroGLYCERIN (NITROSTAT) 0.4 MG SL tablet Place 0.4 mg under the tongue every 5  (five) minutes as needed for chest pain.   Omega-3 Fatty Acids (FISH OIL) 1000 MG CAPS Take 1 capsule by mouth 3 (three) times daily.    OXYGEN Inhale 2 L into the lungs continuous. To keep spo2 above 90%   oxymetazoline (AFRIN) 0.05 % nasal spray Place 1 spray into both nostrils. SOAK COTTON BALL AND INSERT IN NOSE AS NEEDED FOR NOSE BLEED   polyethylene glycol (MIRALAX / GLYCOLAX) packet Take 17 g by mouth daily.    pravastatin (PRAVACHOL) 40 MG tablet Take 40 mg by mouth at bedtime.    primidone (MYSOLINE) 50 MG tablet Take 25 mg by mouth. GIVE HALF TABLET (25MG ) BY MOUTH AT BEDTIME FOR ESSENTIAL TREMORS   sodium chloride (OCEAN) 0.65 % SOLN nasal spray Place 1 spray into both nostrils as needed for congestion. Apply one spray to each nostril three times daily x 8 weeks for dry nasal mucus membranes   traMADol (ULTRAM) 50 MG tablet Take 1 tablet (50 mg total) by mouth every 8 (eight) hours. Every shift for pain   traZODone (DESYREL) 50 MG tablet Take 1 tablet (50 mg total) by mouth at bedtime.   No facility-administered encounter medications on file as of 04/23/2018.      SIGNIFICANT DIAGNOSTIC EXAMS  LABS REVIEWED TODAY:   01-23-18: glucose 272; bun 65; creat 2.7; k+ 4.5; na++ 144 chol 165; ldl 63; trig 311; hdl 39; hgb a1c 7.6; tsh 1.47 vit D 43.66   Review of Systems  Constitutional: Negative for malaise/fatigue.  Respiratory: Negative for cough and shortness of breath.   Cardiovascular: Negative for chest pain, palpitations and leg swelling.  Gastrointestinal: Negative for abdominal pain, constipation and heartburn.  Musculoskeletal: Negative for back pain, joint pain and myalgias.  Skin: Negative.   Neurological: Negative for dizziness.  Psychiatric/Behavioral: The patient is not nervous/anxious.    Physical Exam Constitutional:      General: He is not in acute distress.    Appearance: He is well-developed. He is obese. He is not diaphoretic.  Neck:      Musculoskeletal: Neck supple.     Thyroid: No thyromegaly.  Cardiovascular:     Rate and Rhythm: Normal rate and regular rhythm.     Pulses: Normal pulses.     Heart sounds: Normal heart sounds.     Comments: Psychologist, forensic present History of cabg Pulmonary:     Effort: Pulmonary effort is normal. No respiratory distress.     Breath sounds: Normal breath sounds.     Comments: 02 dependent  Abdominal:     General: Bowel sounds are normal. There is no distension.     Palpations: Abdomen is soft.     Tenderness: There is no abdominal tenderness.  Musculoskeletal:     Right lower leg: No edema.     Left lower leg: No edema.     Comments: Is able to move all extremities Has stiffness present  Has tremor present  Lymphadenopathy:     Cervical: No cervical adenopathy.  Skin:    General: Skin is warm and dry.     Comments: Bilateral stage 2 heel ulcerations without signs of infection present   Neurological:     Mental Status: He is alert. Mental status is at baseline.  Psychiatric:        Mood and Affect: Mood normal.       ASSESSMENT/ PLAN:  TODAY:   1. 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 Chronic systolic CHF (congestive heart failure) is stable will continue lasix 40 mg daily coreg 3.125 mg twice daily   3. Complete heart block: is status post pace maker: will continue asa 81 mg daily   4. Coronary artery disease involving coronary bypass graft of native heart with angina pectoris: is stable will continue coreg 3.125 mg twice daily asa 81 mg daily has prn ntg  5. Chronic respiratory failure with hypoxia: is stable is 02 dependent   6. Type 2 diabetes mellitus with stage 3 chronic kidney disease and hypertension: is stable hgb a1c 7.6: will continue metformin 1 gm twice daily toujeo 60 units  nightly humalog 20 units with meals plus SSI; 201-250: 4 units; 251-300: 6 units; 301-350: 8 units; 351-400: 10 units is on asa arb; statin   7. Dyslipidemia associated with type 2 diabetes mellitus: is stable LDL 63 trig 311: will continue tricor 145 mg daily and fish 1 gm three times daily pravachol 40 mg daily'  8. Chronic constipation: is stable will continue miralax daily and colace daily as needed  9. Diabetic peripheral neuropathy: is stable will continue neurontin 400 mg three times daily has ultram 50 mg every 8 hours routinely   10. Other secondary parkinsonism: is stable will continue sinemet IR 25/100 mg 2 tabs three times daily   11. Early onset alzheimer's disease with behavioral disturbance: is without change: weight is 242 pounds; will continue aricept 10 mg daily   12. Essential tremor: is stable will continue primidone 25 mg nightly   13. CKD stage 3 due to type 2 diabetes mellitus: is stable bun 65; creat 2.7  14.  Chronic back pain: is stable will continue ultram 50 mg every 8 hours and lidoderm patch to back daily   15. Bipolar affective disorder: is emotionally stable: will continue klonopin 0.5 mg nightly lamictal 125 mg daily lexapro 5 mg daily   16. Anemia of chronic disease: stable will continue iron daily      MD is aware of resident's narcotic use and is in agreement with current plan of care. We will attempt to wean resident as apropriate   Ok Edwards NP [facility]  Contact [phone] Monday through Friday 8am- 5pm  After hours call [phone]

## 2018-04-25 DIAGNOSIS — J9611 Chronic respiratory failure with hypoxia: Secondary | ICD-10-CM | POA: Insufficient documentation

## 2018-04-25 DIAGNOSIS — N183 Chronic kidney disease, stage 3 unspecified: Secondary | ICD-10-CM | POA: Insufficient documentation

## 2018-04-25 DIAGNOSIS — E1122 Type 2 diabetes mellitus with diabetic chronic kidney disease: Secondary | ICD-10-CM | POA: Insufficient documentation

## 2018-04-25 DIAGNOSIS — I129 Hypertensive chronic kidney disease with stage 1 through stage 4 chronic kidney disease, or unspecified chronic kidney disease: Secondary | ICD-10-CM

## 2018-04-25 DIAGNOSIS — G250 Essential tremor: Secondary | ICD-10-CM | POA: Insufficient documentation

## 2018-04-25 DIAGNOSIS — E785 Hyperlipidemia, unspecified: Secondary | ICD-10-CM

## 2018-04-25 DIAGNOSIS — K5909 Other constipation: Secondary | ICD-10-CM | POA: Insufficient documentation

## 2018-04-25 DIAGNOSIS — I130 Hypertensive heart and chronic kidney disease with heart failure and stage 1 through stage 4 chronic kidney disease, or unspecified chronic kidney disease: Secondary | ICD-10-CM | POA: Insufficient documentation

## 2018-04-25 DIAGNOSIS — E1169 Type 2 diabetes mellitus with other specified complication: Secondary | ICD-10-CM | POA: Insufficient documentation

## 2018-04-26 ENCOUNTER — Other Ambulatory Visit: Payer: Self-pay

## 2018-04-26 ENCOUNTER — Telehealth: Payer: Self-pay

## 2018-04-26 ENCOUNTER — Encounter: Payer: Medicare Other | Admitting: *Deleted

## 2018-04-26 NOTE — Telephone Encounter (Signed)
Spoke with patient to remind of missed remote transmission 

## 2018-04-30 ENCOUNTER — Non-Acute Institutional Stay (SKILLED_NURSING_FACILITY): Payer: Medicare Other | Admitting: Adult Health

## 2018-04-30 ENCOUNTER — Encounter: Payer: Self-pay | Admitting: Adult Health

## 2018-04-30 DIAGNOSIS — R1312 Dysphagia, oropharyngeal phase: Secondary | ICD-10-CM | POA: Diagnosis not present

## 2018-04-30 DIAGNOSIS — K219 Gastro-esophageal reflux disease without esophagitis: Secondary | ICD-10-CM | POA: Diagnosis not present

## 2018-04-30 NOTE — Progress Notes (Signed)
Location:   [facility] Room Number: 50 Place of Service:  SNF (31)   CODE STATUS: DNR  Allergies  Allergen Reactions  . Benzedrine [Amphetamine] Nausea Only  . Dexedrine [Dextroamphetamine Sulfate Er] Other (See Comments)    agitation  . Oxycodone Nausea And Vomiting  . Vancomycin     Unknown - per [facility]    Chief Complaint  Patient presents with  . Acute Visit    GERD    HPI:  Staff report that he has been coughing when he eats. He tells me that he is coughing at times with meals; he does have heart burn; denies any nausea or vomiting. No reports of fevers present.    Past Medical History:  Diagnosis Date  . 2nd degree AV block    a. s/p STJ dual chamber pacemaker 11/2014  . Acute respiratory failure ([place]) 01/26/2015  . AKI (acute kidney injury) ([place]) 01/26/2015  . AMI, INFERIOR WALL 09/30/2008   Qualifier: Diagnosis of  By: Unk Lightning, RN, BSN, Melanie    . Benign neoplasm of colon     Adenomatous polyps  . Benign prostatic hyperplasia   . Bipolar affective disorder ([place])    . Bipolar depression ([place]) 05/10/2015  . BPH (benign prostatic hypertrophy)   . CAD (coronary artery disease)    a. s/p CABG  . CAD, ARTERY BYPASS GRAFT 11/18/2008   Qualifier: Diagnosis of  By: Angelena Form, MD, Harrell Gave    . Chronic pain    . CVA (cerebral infarction)     Small left occipital  . Dementia with behavioral disturbance ([place]) 02/15/2015  . Depression    . Diabetic peripheral neuropathy ([place]) 09/28/2011  . DM        . DM (diabetes mellitus), type 2 with peripheral vascular complications ([place]) 3/53/2992   Qualifier: Diagnosis of  By: Burnett Kanaris    . Gout    . History of alcohol abuse    . Hyperlipidemia    . HYPERTENSION        . Hypertensive heart disease with heart failure ([place]) 09/01/2008   Qualifier: Diagnosis of  By: Burnett Kanaris    . Insomnia 04/19/2012  . Ischemic cardiomyopathy    . Narcolepsy    . Obesity   . OBESITY 09/01/2008   Qualifier: Diagnosis of  By: Burnett Kanaris    . OBSTRUCTIVE SLEEP APNEA        . Obstructive sleep apnea 09/16/2008   Qualifier: Diagnosis of  By: [name] MD, [name]   . Pacemaker 12/12/2014  . Parkinsonism ([place]) 09/30/2015  . [facility] spotted fever   . SINUS TACHYCARDIA 09/02/2008   Qualifier: Diagnosis of  By: Caryl Comes, MD, [name]   . TIA (transient ischemic attack)    . TOBACCO ABUSE 07/31/2007   Qualifier: Diagnosis of  By: [name] MD, [name]   . Tremor 08/11/2015  . UNIVERSAL ULCERATIVE COLITIS        . Vitamin D deficiency 01/07/2015    Past Surgical History:  Procedure Laterality Date  . CARDIAC CATHETERIZATION     Triple-vessel coronary artery disease. Low-normal left ventricular systolic function with mild   anteroapical wall motion abnormality.   [name] Kitchen CARDIAC CATHETERIZATION N/A 01/28/2015   Procedure: Temporary Pacemaker;  Surgeon: [name], MD;  Location: [facility] CV LAB;  Service: Cardiovascular;  Laterality: N/A;  . CORONARY ARTERY BYPASS GRAFT     Median sternotomy, extracorporeal circulation,  coronary artery bypass graft surgery x4  using a sequential left internal   mammary artery graft to the mid and distal left anterior descending, a   saphenous vein graft to diagonal branch of the left anterior descending,   and a saphenous vein graft to the obtuse marginal branch of left   circumflex coronary artery.    . EP IMPLANTABLE DEVICE N/A 12/12/2014   Procedure: Pacemaker Implant;  Surgeon: [name], MD;  Location: [facility];  Service: Cardiovascular;  Laterality: N/A;  . EP IMPLANTABLE DEVICE N/A 01/28/2015   Procedure: PPM Generator Removal;  Surgeon: [name], MD;  Location: [facility];  Service: Cardiovascular;  Laterality: N/A;  . EP IMPLANTABLE DEVICE N/A 03/20/2015   Procedure: Pacemaker Implant;  Surgeon: [name], MD;  Location: [facility];  Service: Cardiovascular;  Laterality: N/A;  . FLEXIBLE  SIGMOIDOSCOPY  01/17/2012   Procedure: FLEXIBLE SIGMOIDOSCOPY;  Surgeon: Lafayette Dragon, MD;  Location: [facility];  Service: Endoscopy;  Laterality: N/A;  . MULTIPLE EXTRACTIONS WITH ALVEOLOPLASTY  12/01/2011   Procedure: MULTIPLE EXTRACION WITH ALVEOLOPLASTY;  Surgeon: Lenn Cal, DDS;  Location: [facility];  Service: Oral Surgery;  Laterality: N/A;  Extraction of tooth #'s 3,4,5,6,7,8,9,10,11,12,13,14,15,16,17,20,21,22,23,24,25,26,27,28,29,30,31,32 with alveoloplasty and bilateral mandibular lingual tori  . TONSILECTOMY, ADENOIDECTOMY, BILATERAL MYRINGOTOMY AND TUBES  1946    Social History   Socioeconomic History  . Marital status: Married    Spouse name: [name]  . Number of children: 2  . Years of education: 74  . Highest education level: Not on file  Occupational History  . 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 Financial resource strain: Not hard at all  . Food insecurity:    Worry: Never true    Inability: Never true  . Transportation needs:    Medical: No    Non-medical: No  Tobacco Use  . Smoking status: Former Smoker    Packs/day: 2.00    Years: 50.00    Pack years: 100.00    Last attempt to quit: 08/18/2014    Years since quitting: 3.7  . Smokeless tobacco: Former Network engineer and Sexual Activity  . Alcohol use: No    Comment: quit 2003  . Drug use: No  . Sexual activity: Never  Lifestyle  . Physical activity:    Days per week: 0 days    Minutes per session: 0 min  . Stress: To some extent  Relationships  . Social connections:    Talks on phone: Once a week    Gets together: Once a week    Attends religious service: Never    Active member of club or organization: No    Attends meetings of clubs or organizations: Never    Relationship status: Married  . Intimate partner violence:    Fear of current or ex partner: No    Emotionally abused: No    Physically abused: No    Forced sexual activity: No   Other Topics Concern  . Not on file  Social History Narrative   Patient has been married for 51 years.- [name]   Patient presents with his wife today.    Patient has 2 grown children and lives in [place] and [place] areas respectively.   Patient is a nonsmoker, stopped smoking 2016    nondrinker at this time, quit 2003   09/11/15 lives at [facility] and [place] since 09/02/14   Caffeine-  1 soda daily   DNR   Family History  Problem Relation Age of Onset  . Diabetes Father   . Heart disease Father   . Stroke Father   . Heart attack Mother   . Aortic aneurysm Mother   . Alcohol abuse Other   . Arthritis Other   . Heart disease Other   . Mental illness Other   . Diabetes Other   . Alcohol abuse Other   . Arthritis Other   . Hyperlipidemia Other   . Heart disease Other   . Stroke Other   . Hypertension Other   . Diabetes Other   . Mental illness Other   . Heart disease Brother       VITAL SIGNS BP 124/88   Pulse 72   Temp (!) 97 F (36.1 C) (Oral)   Resp 18   Ht 5\' 4"  (1.626 m)   Wt 243 lb 6.4 oz (110.4 kg)   SpO2 98%   BMI 41.78 kg/m   Outpatient Encounter Medications as of 04/30/2018  Medication Sig  . Amino Acids-Protein Hydrolys (FEEDING SUPPLEMENT, PRO-STAT SUGAR FREE 64,) LIQD Take 30 mLs by mouth daily. aid in wound healing (May mix in beverage of resident's preference).  [name] Kitchen aspirin 81 MG chewable tablet Chew 81 mg by mouth at bedtime.   . carbidopa-levodopa (SINEMET IR) 25-100 MG tablet Take 2 tablets by mouth 3 (three) times daily with meals.   . carvedilol (COREG) 3.125 MG tablet Take 3.125 mg by mouth 2 (two) times daily with a meal. Hold for SBP less than 105   . clonazePAM (KLONOPIN) 0.5 MG tablet Take 1 tablet (0.5 mg total) by mouth at bedtime.  . docusate sodium (COLACE) 100 MG capsule Take 100 mg by mouth daily as needed for mild constipation.  [name] Kitchen donepezil (ARICEPT) 10 MG tablet Take 10 mg by mouth at bedtime.  [name] Kitchen doxazosin (CARDURA) 1 MG  tablet Take 1 mg by mouth at bedtime.  [name] Kitchen escitalopram (LEXAPRO) 5 MG tablet Take 5 mg by mouth daily.  . fenofibrate (TRICOR) 145 MG tablet Take 145 mg by mouth daily. With a meal  . ferrous sulfate 325 (65 FE) MG tablet Take 325 mg by mouth daily with breakfast.  . furosemide (LASIX) 40 MG tablet Take 40 mg by mouth daily.  [name] Kitchen gabapentin (NEURONTIN) 400 MG capsule Take 400 mg by mouth 3 (three) times daily.   . Insulin Glargine (TOUJEO MAX SOLOSTAR) 300 UNIT/ML SOPN Inject 60 Units into the skin at bedtime. (PRIME PEN WITH 3 UNITS PRIOR TO EACH USE - DO NOT MIX WITH ANY OTHER INSULIN - USE WITHIN 42 DAYS AFTER OPENING  . insulin lispro (HUMALOG KWIKPEN) 100 UNIT/ML KiwkPen Inject 20 Units into the skin 3 (three) times daily. Hold for CBG  < 200. ( to be given in addition to sliding scale) Check FSBS before meals and at bedtime. SSI 201-25 4 units, 251-300 6 units, 301-350 8 units, 351-400 10 units  . lamoTRIgine (LAMICTAL) 100 MG tablet Take 100 mg by mouth daily. Give 100 mg tablet along with 25 mg tablet to equal 125 mg total  . lamoTRIgine (LAMICTAL) 25 MG tablet Take 25 mg by mouth daily. give one tab with Lamictal 100 mg to equal 125 mg daily  . lidocaine (LIDODERM) 5 % Place 1 patch onto the skin. Apply 1 patch to lower back on in AM. Remove patch from lower back in PM Remove & Discard patch within 12 hours or as directed  by MD  . losartan (COZAAR) 50 MG tablet Take 50 mg by mouth every morning.   . Magnesium Oxide 500 MG TABS Take 1 tablet by mouth daily.  . metFORMIN (GLUCOPHAGE) 1000 MG tablet Take 1,000 mg by mouth 2 (two) times daily.   . Multiple Vitamin (MULTIVITAMIN) tablet Take 1 tablet by mouth daily.  . nitroGLYCERIN (NITROSTAT) 0.4 MG SL tablet Place 0.4 mg under the tongue every 5 (five) minutes as needed for chest pain.  . Omega-3 Fatty Acids (FISH OIL) 1000 MG CAPS Take 1 capsule by mouth 3 (three) times daily.   . OXYGEN Inhale 2 L into the lungs continuous. To keep spo2  above 90%  . polyethylene glycol (MIRALAX / GLYCOLAX) packet Take 17 g by mouth daily.   . pravastatin (PRAVACHOL) 40 MG tablet Take 40 mg by mouth at bedtime.   . primidone (MYSOLINE) 50 MG tablet Take 25 mg by mouth. GIVE HALF TABLET (25MG ) BY MOUTH AT BEDTIME FOR ESSENTIAL TREMORS  . traMADol (ULTRAM) 50 MG tablet Take 1 tablet (50 mg total) by mouth every 8 (eight) hours. Every shift for pain  . traZODone (DESYREL) 50 MG tablet Take 1 tablet (50 mg total) by mouth at bedtime.  . [DISCONTINUED] oxymetazoline (AFRIN) 0.05 % nasal spray Place 1 spray into both nostrils. SOAK COTTON BALL AND INSERT IN NOSE AS NEEDED FOR NOSE BLEED  . [DISCONTINUED] sodium chloride (OCEAN) 0.65 % SOLN nasal spray Place 1 spray into both nostrils as needed for congestion. Apply one spray to each nostril three times daily x 8 weeks for dry nasal mucus membranes   No facility-administered encounter medications on file as of 04/30/2018.      SIGNIFICANT DIAGNOSTIC EXAMS  LABS REVIEWED PREVIOUS:   01-23-18: glucose 272; bun 65; creat 2.7; k+ 4.5; na++ 144 chol 165; ldl 63; trig 311; hdl 39; hgb a1c 7.6; tsh 1.47 vit D 43.66   NO NEW LABS.    Review of Systems  Constitutional: Negative for malaise/fatigue.  Respiratory: Positive for cough. Negative for shortness of breath.   Cardiovascular: Negative for chest pain, palpitations and leg swelling.  Gastrointestinal: Positive for heartburn. Negative for abdominal pain, constipation, nausea and vomiting.  Musculoskeletal: Negative for back pain, joint pain and myalgias.  Skin: Negative.   Neurological: Negative for dizziness.  Psychiatric/Behavioral: The patient is not nervous/anxious.      Physical Exam Constitutional:      General: He is not in acute distress.    Appearance: He is morbidly obese. He is not diaphoretic.  Neck:     Musculoskeletal: Neck supple.     Thyroid: No thyromegaly.  Cardiovascular:     Rate and Rhythm: Normal rate and regular  rhythm.     Pulses: Normal pulses.     Heart sounds: Normal heart sounds.     Comments: Psychologist, forensic present History of cabg Pulmonary:     Effort: Pulmonary effort is normal. No respiratory distress.     Breath sounds: Normal breath sounds.     Comments: 02 dependent  Abdominal:     General: Bowel sounds are normal. There is no distension.     Palpations: Abdomen is soft.     Tenderness: There is no abdominal tenderness.  Musculoskeletal:     Right lower leg: No edema.     Left lower leg: No edema.     Comments: Is able to move all extremities Has stiffness present  Has tremor present  Lymphadenopathy:  Cervical: No cervical adenopathy.  Skin:    General: Skin is warm and dry.     Comments:  Bilateral stage 2 heel ulcerations without signs of infection present    Neurological:     Mental Status: He is alert. Mental status is at baseline.  Psychiatric:        Mood and Affect: Mood normal.        ASSESSMENT/ PLAN:  TODAY:   1. GERD without esophagitis 2. Dysphagia oropharyngeal phase  Will have ST evaluate and treat as indicated Will begin prilosec 20 mg daily    MD is aware of resident's narcotic use and is in agreement with current plan of care. We will attempt to wean resident as apropriate   Ok Edwards NP [facility]  Contact [phone] Monday through Friday 8am- 5pm  After hours call [phone]

## 2018-05-03 DIAGNOSIS — R1312 Dysphagia, oropharyngeal phase: Secondary | ICD-10-CM | POA: Insufficient documentation

## 2018-05-03 DIAGNOSIS — K219 Gastro-esophageal reflux disease without esophagitis: Secondary | ICD-10-CM | POA: Insufficient documentation

## 2018-05-07 ENCOUNTER — Encounter: Payer: Self-pay | Admitting: Cardiology

## 2018-05-21 ENCOUNTER — Other Ambulatory Visit: Payer: Self-pay | Admitting: Internal Medicine

## 2018-05-21 MED ORDER — CLONAZEPAM 0.5 MG PO TABS: 0.5000 mg | ORAL_TABLET | Freq: Every day | ORAL | 0 refills | Status: AC

## 2018-05-21 MED ORDER — TRAMADOL HCL 50 MG PO TABS: 50.0000 mg | ORAL_TABLET | Freq: Three times a day (TID) | ORAL | 0 refills | Status: DC

## 2018-05-22 ENCOUNTER — Other Ambulatory Visit: Payer: Self-pay | Admitting: Internal Medicine

## 2018-05-22 ENCOUNTER — Encounter: Payer: Self-pay | Admitting: Internal Medicine

## 2018-05-22 ENCOUNTER — Non-Acute Institutional Stay (SKILLED_NURSING_FACILITY): Payer: Medicare Other | Admitting: Internal Medicine

## 2018-05-22 DIAGNOSIS — E1142 Type 2 diabetes mellitus with diabetic polyneuropathy: Secondary | ICD-10-CM

## 2018-05-22 DIAGNOSIS — F0281 Dementia in other diseases classified elsewhere with behavioral disturbance: Secondary | ICD-10-CM | POA: Diagnosis not present

## 2018-05-22 DIAGNOSIS — I639 Cerebral infarction, unspecified: Secondary | ICD-10-CM | POA: Diagnosis not present

## 2018-05-22 DIAGNOSIS — G300 Alzheimer's disease with early onset: Secondary | ICD-10-CM

## 2018-05-22 MED ORDER — TRAMADOL HCL 50 MG PO TABS: 50.0000 mg | ORAL_TABLET | Freq: Three times a day (TID) | ORAL | 0 refills | Status: DC

## 2018-05-22 NOTE — Progress Notes (Signed)
Location:  [facility] Room Number: 308-D Place of Service:  SNF (31)  [name], MD  Patient Care Team: [name], MD as PCP - General (Internal Medicine) [name], MD as Consulting Physician (Cardiology) [name], MD as Consulting Physician (Neurology)  Extended Emergency Contact Information Primary Emergency Contact: [name],[name] Address: [address]          [place], [place] [address] [name] of [place] Phone: [phone] Mobile Phone: [phone] Relation: Spouse Secondary Emergency Contact: [name], [name] of [place] Phone: [phone] Mobile Phone: [phone] Relation: Daughter    Allergies: Benzedrine [amphetamine]; Dexedrine [dextroamphetamine sulfate er]; Oxycodone; and Vancomycin  Chief Complaint  Patient presents with  . Medical Management of Chronic Issues    Routine [facility] visit    HPI: Patient is 78 y.o. male who is being seen for routine issues of history of CVA, peripheral neuropathy, and dementia.  Past Medical History:  Diagnosis Date  . 2nd degree AV block    a. s/p STJ dual chamber pacemaker 11/2014  . Acute respiratory failure ([place]) 01/26/2015  . AKI (acute kidney injury) ([place]) 01/26/2015  . AMI, INFERIOR WALL 09/30/2008   Qualifier: Diagnosis of  By: Unk Lightning, RN, BSN, Melanie    . Benign neoplasm of colon     Adenomatous polyps  . Benign prostatic hyperplasia   . Bipolar affective disorder ([place])    . Bipolar depression ([place]) 05/10/2015  . BPH (benign prostatic hypertrophy)   . CAD (coronary artery disease)    a. s/p CABG  . CAD, ARTERY BYPASS GRAFT 11/18/2008   Qualifier: Diagnosis of  By: Angelena Form, MD, Harrell Gave    . Chronic pain    . CVA (cerebral infarction)     Small left occipital  . Dementia with behavioral disturbance ([place]) 02/15/2015  . Depression    . Diabetic peripheral neuropathy ([place]) 09/28/2011  . DM  (diabetes mellitus), type 2 with peripheral vascular complications ([place]) 0/34/7425   Qualifier: Diagnosis of  By: Burnett Kanaris    . Gout    . History of alcohol abuse    . Hyperlipidemia    . HYPERTENSION        . Hypertensive heart disease with heart failure ([place]) 09/01/2008   Qualifier: Diagnosis of  By: Burnett Kanaris    . Insomnia 04/19/2012  . Ischemic cardiomyopathy    . Narcolepsy    . Obesity   . OBESITY 09/01/2008   Qualifier: Diagnosis of  By: Burnett Kanaris    . OBSTRUCTIVE SLEEP APNEA        . Obstructive sleep apnea 09/16/2008   Qualifier: Diagnosis of  By: [name] MD, [name]   . Pacemaker 12/12/2014  . Parkinsonism ([place]) 09/30/2015  . [facility] spotted fever   . SINUS TACHYCARDIA 09/02/2008   Qualifier: Diagnosis of  By: Caryl Comes, MD, [name]   . TIA (transient ischemic attack)    . TOBACCO ABUSE 07/31/2007   Qualifier: Diagnosis of  By: [name] MD, [name]   . Tremor 08/11/2015  . UNIVERSAL ULCERATIVE COLITIS        . Vitamin D deficiency 01/07/2015    Past Surgical History:  Procedure Laterality Date  . CARDIAC CATHETERIZATION     Triple-vessel coronary artery disease. Low-normal left ventricular systolic function with mild   anteroapical wall motion abnormality.   [name] Kitchen CARDIAC CATHETERIZATION N/A 01/28/2015  Procedure: Temporary Pacemaker;  Surgeon: [name], MD;  Location: [facility];  Service: Cardiovascular;  Laterality: N/A;  . CORONARY ARTERY BYPASS GRAFT     Median sternotomy, extracorporeal circulation,  coronary artery bypass graft surgery x4 using a sequential left internal   mammary artery graft to the mid and distal left anterior descending, a   saphenous vein graft to diagonal branch of the left anterior descending,   and a saphenous vein graft to the obtuse marginal branch of left   circumflex coronary artery.    . EP IMPLANTABLE DEVICE N/A 12/12/2014   Procedure: Pacemaker Implant;  Surgeon: [name], MD;   Location: [facility];  Service: Cardiovascular;  Laterality: N/A;  . EP IMPLANTABLE DEVICE N/A 01/28/2015   Procedure: PPM Generator Removal;  Surgeon: [name], MD;  Location: [facility];  Service: Cardiovascular;  Laterality: N/A;  . EP IMPLANTABLE DEVICE N/A 03/20/2015   Procedure: Pacemaker Implant;  Surgeon: [name], MD;  Location: [facility];  Service: Cardiovascular;  Laterality: N/A;  . FLEXIBLE SIGMOIDOSCOPY  01/17/2012   Procedure: FLEXIBLE SIGMOIDOSCOPY;  Surgeon: Lafayette Dragon, MD;  Location: [facility];  Service: Endoscopy;  Laterality: N/A;  . MULTIPLE EXTRACTIONS WITH ALVEOLOPLASTY  12/01/2011   Procedure: MULTIPLE EXTRACION WITH ALVEOLOPLASTY;  Surgeon: Lenn Cal, DDS;  Location: [facility];  Service: Oral Surgery;  Laterality: N/A;  Extraction of tooth #'s 3,4,5,6,7,8,9,10,11,12,13,14,15,16,17,20,21,22,23,24,25,26,27,28,29,30,31,32 with alveoloplasty and bilateral mandibular lingual tori  . TONSILECTOMY, ADENOIDECTOMY, BILATERAL MYRINGOTOMY AND TUBES  1948    Allergies as of 05/22/2018      Reactions   Benzedrine [amphetamine] Nausea Only   Dexedrine [dextroamphetamine Sulfate Er] Other (See Comments)   agitation   Oxycodone Nausea And Vomiting   Vancomycin    Unknown - per [facility]      Medication List       Accurate as of May 22, 2018 11:59 PM. If you have any questions, ask your nurse or doctor.        STOP taking these medications   feeding supplement (PRO-STAT SUGAR FREE 64) Liqd Stopped by:  Inocencio Homes, MD     TAKE these medications   Afrin Nasal Spray 0.05 % nasal spray Generic drug:  oxymetazoline Place 1 spray into both nostrils as needed (Nose bleed). Spray cotton ball and insert into nose as needed for nose bleed.   aspirin 81 MG chewable tablet Chew 81 mg by mouth at bedtime.   carbidopa-levodopa 25-100 MG tablet Commonly known as:  SINEMET IR Take 2 tablets by mouth 3 (three) times daily with meals.    carvedilol 3.125 MG tablet Commonly known as:  COREG Take 3.125 mg by mouth 2 (two) times daily with a meal. Hold for SBP less than 105   clonazePAM 0.5 MG tablet Commonly known as:  KLONOPIN Take 1 tablet (0.5 mg total) by mouth at bedtime.   docusate sodium 100 MG capsule Commonly known as:  COLACE Take 100 mg by mouth daily as needed for mild constipation.   donepezil 10 MG tablet Commonly known as:  ARICEPT Take 10 mg by mouth at bedtime.   doxazosin 1 MG tablet Commonly known as:  CARDURA Take 1 mg by mouth at bedtime.   fenofibrate 145 MG tablet Commonly known as:  TRICOR Take 145 mg by mouth daily. With a meal   ferrous sulfate 325 (65 FE) MG tablet Take 325 mg by mouth daily with breakfast.  Fish Oil 1000 MG Caps Take 1 capsule by mouth 3 (three) times daily.   furosemide 40 MG tablet Commonly known as:  LASIX Take 40 mg by mouth daily.   gabapentin 400 MG capsule Commonly known as:  NEURONTIN Take 400 mg by mouth 3 (three) times daily.   HumaLOG KwikPen 100 UNIT/ML KiwkPen Generic drug:  insulin lispro Inject 20 Units into the skin 3 (three) times daily. Hold for CBG  < 200. ( to be given in addition to sliding scale) Check FSBS before meals and at bedtime. SSI 201-250 = 4 units, 251-300 = 6 units, 301-350 = 8 units, 351-400 = 10 units   lamoTRIgine 25 MG tablet Commonly known as:  LAMICTAL Take 25 mg by mouth daily. give one tab with Lamictal 100 mg to equal 125 mg daily   lamoTRIgine 100 MG tablet Commonly known as:  LAMICTAL Take 100 mg by mouth daily. Give 100 mg tablet along with 25 mg tablet to equal 125 mg total   Lexapro 5 MG tablet Generic drug:  escitalopram Take 5 mg by mouth daily.   lidocaine 5 % Commonly known as:  LIDODERM Place 1 patch onto the skin. Apply 1 patch to lower back on in AM. Remove patch from lower back in PM Remove & Discard patch within 12 hours or as directed by MD   losartan 50 MG tablet Commonly known as:   COZAAR Take 50 mg by mouth every morning.   Magnesium Oxide 500 MG Tabs Take 1 tablet by mouth daily.   metFORMIN 1000 MG tablet Commonly known as:  GLUCOPHAGE Take 1,000 mg by mouth 2 (two) times daily.   multivitamin tablet Take 1 tablet by mouth daily.   nitroGLYCERIN 0.4 MG SL tablet Commonly known as:  NITROSTAT Place 0.4 mg under the tongue every 5 (five) minutes as needed for chest pain. x3 doses PRN, notify MD if no relief   omeprazole 20 MG tablet Commonly known as:  PRILOSEC OTC Take 20 mg by mouth daily.   OXYGEN Inhale 2 L into the lungs continuous. To keep spo2 above 90%   polyethylene glycol 17 g packet Commonly known as:  MIRALAX / GLYCOLAX Take 17 g by mouth daily.   pravastatin 40 MG tablet Commonly known as:  PRAVACHOL Take 40 mg by mouth at bedtime.   primidone 50 MG tablet Commonly known as:  MYSOLINE Take 25 mg by mouth. GIVE HALF TABLET (25MG ) BY MOUTH AT BEDTIME FOR ESSENTIAL TREMORS   promethazine 25 MG tablet Commonly known as:  PHENERGAN Take 25 mg by mouth every 6 (six) hours as needed for nausea or vomiting. x3 doses PRN, notify MD if persists more than 24 hours   Toujeo Max SoloStar 300 UNIT/ML Sopn Generic drug:  Insulin Glargine Inject 60 Units into the skin at bedtime. (PRIME PEN WITH 3 UNITS PRIOR TO EACH USE - DO NOT MIX WITH ANY OTHER INSULIN - USE WITHIN 42 DAYS AFTER OPENING   traMADol 50 MG tablet Commonly known as:  ULTRAM Take 1 tablet (50 mg total) by mouth every 8 (eight) hours. Every shift for pain   traZODone 50 MG tablet Commonly known as:  DESYREL Take 1 tablet (50 mg total) by mouth at bedtime.       No orders of the defined types were placed in this encounter.   Immunization History  Administered Date(s) Administered  . Influenza-Unspecified 10/28/2016  . PPD Test 03/20/2013  . Pneumococcal Conjugate-13 10/09/2015  . Pneumococcal Polysaccharide-23  09/02/2014    Social History   Tobacco Use  . Smoking  status: Former Smoker    Packs/day: 2.00    Years: 50.00    Pack years: 100.00    Last attempt to quit: 08/18/2014    Years since quitting: 3.7  . Smokeless tobacco: Former Network engineer Use Topics  . Alcohol use: No    Comment: quit 2003    Review of Systems  DATA OBTAINED: from patient GENERAL:  no fevers, fatigue, appetite changes SKIN: No itching, rash HEENT: No complaint RESPIRATORY: No cough, wheezing, SOB CARDIAC: No chest pain, palpitations, lower extremity edema  GI: No abdominal pain, No N/V/D or constipation, No heartburn or reflux  GU: No dysuria, frequency or urgency, or incontinence  MUSCULOSKELETAL: No unrelieved bone/joint pain NEUROLOGIC: No headache, dizziness  PSYCHIATRIC: No overt anxiety or sadness  Vitals:   05/22/18 0944  BP: 134/80  Pulse: 68  Resp: 17  Temp: 98.6 F (37 C)  SpO2: 94%   Body mass index is 41.78 kg/m. Physical Exam  GENERAL APPEARANCE: Alert, Conversant and pleasant, No acute distress  SKIN: Heels is dressed HEENT: Unremarkable RESPIRATORY: Breathing is even, unlabored. Lung sounds are clear   CARDIOVASCULAR: Heart RRR no murmurs, rubs or gallops. No peripheral edema  GASTROINTESTINAL: Abdomen is soft, non-tender, not distended but obese w/ normal bowel sounds.  GENITOURINARY: Bladder non tender, not distended  MUSCULOSKELETAL: No abnormal joints or musculature NEUROLOGIC: Cranial nerves 2-12 grossly intact. Moves all extremities, tremor bilaterally not pill-rolling PSYCHIATRIC: Mood and affect appropriate to situation with some dementia, no behavioral issues  Patient Active Problem List   Diagnosis Date Noted  . GERD without esophagitis 05/03/2018  . Dysphagia, oropharyngeal 05/03/2018  . Hypertensive heart and renal disease, stage 1-4 or unspecified chronic kidney disease, with heart failure ([place]) 04/25/2018  . Chronic respiratory failure with hypoxia ([place]) 04/25/2018  . Type 2 diabetes mellitus with stage 3 chronic  kidney disease and hypertension ([place]) 04/25/2018  . Dyslipidemia associated with type 2 diabetes mellitus ([place]) 04/25/2018  . Chronic constipation 04/25/2018  . Essential tremor 04/25/2018  . CKD stage 3 due to type 2 diabetes mellitus ([place]) 04/25/2018  . Peripheral vascular disease ([place]) 02/04/2017  . Controlled diabetes mellitus type 2 with complications ([place]) 66/44/0347  . Aspiration pneumonia ([place]) 09/30/2015  . Parkinsonism ([place]) 09/30/2015  . Tremor 08/11/2015  . Suicidal ideation 05/10/2015  . Bipolar depression ([place]) 05/10/2015  . Complete heart block ([place]) 03/20/2015  . Bradycardia 03/14/2015  . Dementia with behavioral disturbance ([place]) 02/15/2015  . Staphylococcus aureus bacteremia with sepsis ([place]) 01/28/2015  . Infection of pacemaker pocket ([place]) 01/28/2015  . Acute respiratory failure ([place]) 01/26/2015  . AKI (acute kidney injury) ([place]) 01/26/2015  . Acute encephalopathy 01/26/2015  . Altered mental status   . Vitamin D deficiency 01/07/2015  . Mobitz type 2 second degree atrioventricular block 12/12/2014  . Pacemaker 12/12/2014  . Symptomatic bradycardia 12/03/2014  . Lower back pain 06/12/2012  . Right hip pain 06/12/2012  . Intertrigo 05/09/2012  . Insomnia 04/19/2012  . General weakness 03/07/2012  . 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 Diarrhea 01/17/2012  . Pyelonephritis 01/15/2012  . Diabetic peripheral neuropathy ([place]) 09/28/2011  . Urinary incontinence 09/28/2011  . Preventative health care 09/23/2011  . Gout 09/23/2011  . Bipolar affective disorder ([place]) 09/23/2011  . TIA (transient ischemic attack) 09/23/2011  . Chronic pain 09/23/2011  . DNR (do not resuscitate) 09/23/2011  . Cerebral infarction ([place]) 09/23/2011  .  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 History of alcohol abuse 09/23/2011  . Benign prostatic hyperplasia   . Chronic systolic CHF (congestive heart failure) ([place]) 05/05/2011  . Edema 01/03/2011  . CAD (coronary artery  disease) 05/18/2010  . Hyperlipidemia 05/18/2010  . Other abnormality of urination(788.69) 11/25/2009  . Other specified forms of chronic ischemic heart disease 05/19/2009  . Coronary artery disease involving coronary bypass graft of native heart with angina pectoris ([place]) 11/18/2008  . AMI, INFERIOR WALL 09/30/2008  . Obstructive sleep apnea 09/16/2008  . SINUS TACHYCARDIA 09/02/2008  . DM (diabetes mellitus), type 2 with peripheral vascular complications ([place]) 96/29/5284  . OBESITY 09/01/2008  . Hypertensive heart disease with heart failure ([place]) 09/01/2008  . TOBACCO ABUSE 07/31/2007  . 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    CMP     Component Value Date/Time   NA 144 01/23/2018   K 4.5 01/23/2018   CL 102 03/20/2015 1038   CO2 28 03/20/2015 1038   GLUCOSE 171 (H) 03/20/2015 1038   BUN 65 (A) 01/23/2018   CREATININE 2.7 (A) 01/23/2018   CREATININE 1.23 03/20/2015 1038   CREATININE 1.06 12/05/2014 1536   CALCIUM 9.6 03/20/2015 1038   PROT 5.8 (L) 02/01/2015 0245   ALBUMIN 2.2 (L) 02/01/2015 0245   AST 17 02/11/2017   ALT 6 (A) 02/11/2017   ALKPHOS 21 (A) 02/11/2017   BILITOT 0.4 02/01/2015 0245   GFRNONAA 55 (L) 03/20/2015 1038   GFRAA >60 03/20/2015 1038   Recent Labs    01/23/18  NA 144  K 4.5  BUN 65*  CREATININE 2.7*   No results for input(s): AST, ALT, ALKPHOS, BILITOT, PROT, ALBUMIN in the last 8760 hours. No results for input(s): WBC, NEUTROABS, HGB, HCT, MCV, PLT in the last 8760 hours. Recent Labs    01/23/18  CHOL 165  LDLCALC 63  TRIG 311*   Lab Results  Component Value Date   MICROALBUR >42.6 04/09/2016   Lab Results  Component Value Date   TSH 1.47 01/23/2018   Lab Results  Component Value Date   HGBA1C 7.6 01/23/2018   Lab Results  Component Value Date   CHOL 165 01/23/2018   HDL 39 01/23/2018   LDLCALC 63 01/23/2018   LDLDIRECT 85.2 06/13/2012   TRIG 311 (A) 01/23/2018   CHOLHDL 7 06/13/2012    Significant Diagnostic  Results in last 30 days:  No results found.  Assessment and Plan  Cerebral infarction ([place]) Chronic and stable; continue ASA 81 mg daily  Diabetic peripheral neuropathy (HCC) Patient without complaints; continue Neurontin 400 mg 3 times daily  Dementia with behavioral disturbance Slow decline; continue Aricept 10 mg daily and supportive care    Emanie Behan D. [name], MD

## 2018-05-23 ENCOUNTER — Encounter: Payer: Self-pay | Admitting: Internal Medicine

## 2018-05-23 NOTE — Assessment & Plan Note (Signed)
Slow decline; continue Aricept 10 mg daily and supportive care

## 2018-05-23 NOTE — Assessment & Plan Note (Signed)
Chronic and stable; continue ASA 81 mg daily

## 2018-05-23 NOTE — Assessment & Plan Note (Signed)
Patient without complaints; continue Neurontin 400 mg 3 times daily

## 2018-06-06 ENCOUNTER — Other Ambulatory Visit: Payer: Self-pay | Admitting: Internal Medicine

## 2018-06-18 ENCOUNTER — Other Ambulatory Visit: Payer: Self-pay | Admitting: Internal Medicine

## 2018-06-26 ENCOUNTER — Non-Acute Institutional Stay (SKILLED_NURSING_FACILITY): Payer: Medicare Other | Admitting: Internal Medicine

## 2018-06-26 ENCOUNTER — Encounter: Payer: Self-pay | Admitting: Internal Medicine

## 2018-06-26 DIAGNOSIS — I25709 Atherosclerosis of coronary artery bypass graft(s), unspecified, with unspecified angina pectoris: Secondary | ICD-10-CM | POA: Diagnosis not present

## 2018-06-26 DIAGNOSIS — E1151 Type 2 diabetes mellitus with diabetic peripheral angiopathy without gangrene: Secondary | ICD-10-CM | POA: Diagnosis not present

## 2018-06-26 DIAGNOSIS — I5022 Chronic systolic (congestive) heart failure: Secondary | ICD-10-CM

## 2018-06-26 NOTE — Progress Notes (Signed)
Location:  [facility] Room Number: [place] of Service:  SNF (31)  [name], MD  Patient Care Team: [name], MD as PCP - General (Internal Medicine) [name], MD as Consulting Physician (Cardiology) [name], MD as Consulting Physician (Neurology)  Extended Emergency Contact Information Primary Emergency Contact: [name],[name] Address: [address]          [place], [place] [address] [name] of [place] Phone: [phone] Mobile Phone: [phone] Relation: Spouse Secondary Emergency Contact: [name], [name] of [place] Phone: [phone] Mobile Phone: [phone] Relation: Daughter    Allergies: Benzedrine [amphetamine], Dexedrine [dextroamphetamine sulfate er], Oxycodone, and Vancomycin  Chief Complaint  Patient presents with  . Medical Management of Chronic Issues    Routine Visit    HPI: Patient is 78 y.o. male who is being seen for routine issues of diabetes mellitus type 2, coronary artery disease, and chronic systolic congestive heart failure.  Past Medical History:  Diagnosis Date  . 2nd degree AV block    a. s/p STJ dual chamber pacemaker 11/2014  . Acute respiratory failure ([place]) 01/26/2015  . AKI (acute kidney injury) ([place]) 01/26/2015  . AMI, INFERIOR WALL 09/30/2008   Qualifier: Diagnosis of  By: Unk Lightning, RN, BSN, Melanie    . Benign neoplasm of colon     Adenomatous polyps  . Benign prostatic hyperplasia   . Bipolar affective disorder ([place])    . Bipolar depression ([place]) 05/10/2015  . BPH (benign prostatic hypertrophy)   . CAD (coronary artery disease)    a. s/p CABG  . CAD, ARTERY BYPASS GRAFT 11/18/2008   Qualifier: Diagnosis of  By: Angelena Form, MD, Harrell Gave    . Chronic pain    . CVA (cerebral infarction)     Small left occipital  . Dementia with behavioral disturbance ([place]) 02/15/2015  . Depression    . Diabetic peripheral  neuropathy ([place]) 09/28/2011  . DM (diabetes mellitus), type 2 with peripheral vascular complications ([place]) 8/58/8502   Qualifier: Diagnosis of  By: Burnett Kanaris    . Gout    . History of alcohol abuse    . Hyperlipidemia    . HYPERTENSION        . Hypertensive heart disease with heart failure ([place]) 09/01/2008   Qualifier: Diagnosis of  By: Burnett Kanaris    . Insomnia 04/19/2012  . Ischemic cardiomyopathy    . Narcolepsy    . Obesity   . OBESITY 09/01/2008   Qualifier: Diagnosis of  By: Burnett Kanaris    . OBSTRUCTIVE SLEEP APNEA        . Obstructive sleep apnea 09/16/2008   Qualifier: Diagnosis of  By: [name] MD, [name]   . Pacemaker 12/12/2014  . Parkinsonism ([place]) 09/30/2015  . [facility] spotted fever   . SINUS TACHYCARDIA 09/02/2008   Qualifier: Diagnosis of  By: Caryl Comes, MD, [name]   . TIA (transient ischemic attack)    . TOBACCO ABUSE 07/31/2007   Qualifier: Diagnosis of  By: [name] MD, [name]   . Tremor 08/11/2015  . UNIVERSAL ULCERATIVE COLITIS        . Vitamin D deficiency 01/07/2015    Past Surgical History:  Procedure Laterality Date  . CARDIAC CATHETERIZATION     Triple-vessel coronary artery disease. Low-normal left ventricular systolic function with mild   anteroapical wall motion abnormality.   [name] Kitchen CARDIAC  CATHETERIZATION N/A 01/28/2015   Procedure: Temporary Pacemaker;  Surgeon: [name], MD;  Location: [facility];  Service: Cardiovascular;  Laterality: N/A;  . CORONARY ARTERY BYPASS GRAFT     Median sternotomy, extracorporeal circulation,  coronary artery bypass graft surgery x4 using a sequential left internal   mammary artery graft to the mid and distal left anterior descending, a   saphenous vein graft to diagonal branch of the left anterior descending,   and a saphenous vein graft to the obtuse marginal branch of left   circumflex coronary artery.    . EP IMPLANTABLE DEVICE N/A 12/12/2014   Procedure: Pacemaker Implant;   Surgeon: [name], MD;  Location: [facility];  Service: Cardiovascular;  Laterality: N/A;  . EP IMPLANTABLE DEVICE N/A 01/28/2015   Procedure: PPM Generator Removal;  Surgeon: [name], MD;  Location: [facility];  Service: Cardiovascular;  Laterality: N/A;  . EP IMPLANTABLE DEVICE N/A 03/20/2015   Procedure: Pacemaker Implant;  Surgeon: [name], MD;  Location: [facility];  Service: Cardiovascular;  Laterality: N/A;  . FLEXIBLE SIGMOIDOSCOPY  01/17/2012   Procedure: FLEXIBLE SIGMOIDOSCOPY;  Surgeon: Lafayette Dragon, MD;  Location: [facility];  Service: Endoscopy;  Laterality: N/A;  . MULTIPLE EXTRACTIONS WITH ALVEOLOPLASTY  12/01/2011   Procedure: MULTIPLE EXTRACION WITH ALVEOLOPLASTY;  Surgeon: Lenn Cal, DDS;  Location: [facility];  Service: Oral Surgery;  Laterality: N/A;  Extraction of tooth #'s 3,4,5,6,7,8,9,10,11,12,13,14,15,16,17,20,21,22,23,24,25,26,27,28,29,30,31,32 with alveoloplasty and bilateral mandibular lingual tori  . TONSILECTOMY, ADENOIDECTOMY, BILATERAL MYRINGOTOMY AND TUBES  1948    Allergies as of 06/26/2018      Reactions   Benzedrine [amphetamine] Nausea Only   Dexedrine [dextroamphetamine Sulfate Er] Other (See Comments)   agitation   Oxycodone Nausea And Vomiting   Vancomycin    Unknown - per [facility]      Medication List       Accurate as of June 26, 2018 11:59 PM. If you have any questions, ask your nurse or doctor.        STOP taking these medications   lidocaine 5 % Commonly known as: LIDODERM Stopped by: Inocencio Homes, MD     TAKE these medications   Afrin Nasal Spray 0.05 % nasal spray Generic drug: oxymetazoline Place 1 spray into both nostrils as needed (Nose bleed). Spray cotton ball and insert into nose as needed for nose bleed.   aspirin 81 MG chewable tablet Chew 81 mg by mouth at bedtime.   carbidopa-levodopa 25-100 MG tablet Commonly known as: SINEMET IR Take 2 tablets by mouth 3 (three) times daily  with meals.   carvedilol 3.125 MG tablet Commonly known as: COREG Take 3.125 mg by mouth 2 (two) times daily with a meal. Hold for SBP less than 105   clonazePAM 0.5 MG tablet Commonly known as: KLONOPIN Take 1 tablet (0.5 mg total) by mouth at bedtime.   docusate sodium 100 MG capsule Commonly known as: COLACE Take 100 mg by mouth daily as needed for mild constipation.   donepezil 10 MG tablet Commonly known as: ARICEPT Take 10 mg by mouth at bedtime.   doxazosin 1 MG tablet Commonly known as: CARDURA Take 1 mg by mouth at bedtime.   fenofibrate 145 MG tablet Commonly known as: TRICOR Take 145 mg by mouth daily. With a meal   ferrous sulfate 325 (65 FE) MG tablet Take 325 mg by mouth daily with breakfast.   Fish Oil 1000  MG Caps Take 1 capsule by mouth 3 (three) times daily.   furosemide 40 MG tablet Commonly known as: LASIX Take 40 mg by mouth daily.   gabapentin 400 MG capsule Commonly known as: NEURONTIN Take 400 mg by mouth 3 (three) times daily.   HumaLOG KwikPen 100 UNIT/ML KiwkPen Generic drug: insulin lispro Inject 20 Units into the skin 3 (three) times daily. Hold for CBG  < 200. ( to be given in addition to sliding scale) Check FSBS before meals and at bedtime. SSI 201-250 = 4 units, 251-300 = 6 units, 301-350 = 8 units, 351-400 = 10 units   lamoTRIgine 25 MG tablet Commonly known as: LAMICTAL Take 25 mg by mouth daily. give one tab with Lamictal 100 mg to equal 125 mg daily   lamoTRIgine 100 MG tablet Commonly known as: LAMICTAL Take 100 mg by mouth daily. Give 100 mg tablet along with 25 mg tablet to equal 125 mg total   Lexapro 5 MG tablet Generic drug: escitalopram Take 5 mg by mouth daily.   losartan 50 MG tablet Commonly known as: COZAAR Take 50 mg by mouth every morning.   Magnesium Oxide 500 MG Tabs Take 1 tablet by mouth daily.   metFORMIN 1000 MG tablet Commonly known as: GLUCOPHAGE Take 1,000 mg by mouth 2 (two) times daily.    multivitamin tablet Take 1 tablet by mouth daily.   nitroGLYCERIN 0.4 MG SL tablet Commonly known as: NITROSTAT Place 0.4 mg under the tongue every 5 (five) minutes as needed for chest pain. x3 doses PRN, notify MD if no relief   omeprazole 20 MG tablet Commonly known as: PRILOSEC OTC Take 20 mg by mouth daily.   OXYGEN Inhale 2 L into the lungs continuous. To keep spo2 above 90%   polyethylene glycol 17 g packet Commonly known as: MIRALAX / GLYCOLAX Take 17 g by mouth daily.   pravastatin 40 MG tablet Commonly known as: PRAVACHOL Take 40 mg by mouth at bedtime.   primidone 50 MG tablet Commonly known as: MYSOLINE Take 25 mg by mouth. GIVE HALF TABLET (25MG ) BY MOUTH AT BEDTIME FOR ESSENTIAL TREMORS   promethazine 25 MG tablet Commonly known as: PHENERGAN Take 25 mg by mouth every 6 (six) hours as needed for nausea or vomiting. x3 doses PRN, notify MD if persists more than 24 hours   Skin Prep Wipes Misc Apply to bilateral heels and protective foam dressing every 3 days   Toujeo Max SoloStar 300 UNIT/ML Sopn Generic drug: Insulin Glargine Inject 60 Units into the skin at bedtime. (PRIME PEN WITH 3 UNITS PRIOR TO EACH USE - DO NOT MIX WITH ANY OTHER INSULIN - USE WITHIN 42 DAYS AFTER OPENING   traMADol 50 MG tablet Commonly known as: ULTRAM Take 1 tablet (50 mg total) by mouth every 8 (eight) hours. Every shift for pain   traZODone 50 MG tablet Commonly known as: DESYREL Take 1 tablet (50 mg total) by mouth at bedtime.       No orders of the defined types were placed in this encounter.   Immunization History  Administered Date(s) Administered  . Influenza-Unspecified 10/28/2016, 10/07/2017  . PPD Test 03/20/2013  . Pneumococcal Conjugate-13 10/09/2015  . Pneumococcal Polysaccharide-23 09/02/2014    Social History   Tobacco Use  . Smoking status: Former Smoker    Packs/day: 2.00    Years: 50.00    Pack years: 100.00    Quit date: 08/18/2014    Years  since quitting: 3.8  .  Smokeless tobacco: Former Network engineer Use Topics  . Alcohol use: No    Comment: quit 2003    Review of Systems  DATA OBTAINED: from nurse GENERAL:  no fevers, fatigue, appetite changes SKIN: No itching, rash HEENT: No complaint RESPIRATORY: No cough, wheezing, SOB CARDIAC: No chest pain, palpitations, lower extremity edema  GI: No abdominal pain, No N/V/D or constipation, No heartburn or reflux  GU: No dysuria, frequency or urgency, or incontinence  MUSCULOSKELETAL: No unrelieved bone/joint pain NEUROLOGIC: No headache, dizziness  PSYCHIATRIC: No overt anxiety or sadness  Vitals:   06/26/18 1307  BP: 124/74  Pulse: (!) 58  Resp: 18  Temp: 98.4 F (36.9 C)  SpO2: 93%   Body mass index is 41.78 kg/m. Physical Exam  GENERAL APPEARANCE: Alert, conversant, No acute distress  SKIN: No diaphoresis rash HEENT: Unremarkable RESPIRATORY: Breathing is even, unlabored. L  CARDIOVASCULAR: No peripheral edema  GASTROINTESTINAL: Abdomen is not distended w/ normal bowel sounds.  GENITOURINARY: Bladder  not distended  MUSCULOSKELETAL: No abnormal joints or musculature NEUROLOGIC: Cranial nerves 2-12 grossly intact. Moves all extremities PSYCHIATRIC: Mood and affect appropriate to situation, some dementia, no behavioral issues  Patient Active Problem List   Diagnosis Date Noted  . GERD without esophagitis 05/03/2018  . Dysphagia, oropharyngeal 05/03/2018  . Hypertensive heart and renal disease, stage 1-4 or unspecified chronic kidney disease, with heart failure ([place]) 04/25/2018  . Chronic respiratory failure with hypoxia ([place]) 04/25/2018  . Type 2 diabetes mellitus with stage 3 chronic kidney disease and hypertension ([place]) 04/25/2018  . Dyslipidemia associated with type 2 diabetes mellitus ([place]) 04/25/2018  . Chronic constipation 04/25/2018  . Essential tremor 04/25/2018  . CKD stage 3 due to type 2 diabetes mellitus ([place]) 04/25/2018  . Peripheral  vascular disease ([place]) 02/04/2017  . Controlled diabetes mellitus type 2 with complications ([place]) 09/47/0962  . Aspiration pneumonia ([place]) 09/30/2015  . Parkinsonism ([place]) 09/30/2015  . Tremor 08/11/2015  . Suicidal ideation 05/10/2015  . Bipolar depression ([place]) 05/10/2015  . Complete heart block ([place]) 03/20/2015  . Bradycardia 03/14/2015  . Dementia with behavioral disturbance ([place]) 02/15/2015  . Staphylococcus aureus bacteremia with sepsis ([place]) 01/28/2015  . Infection of pacemaker pocket ([place]) 01/28/2015  . Acute respiratory failure ([place]) 01/26/2015  . AKI (acute kidney injury) ([place]) 01/26/2015  . Acute encephalopathy 01/26/2015  . Altered mental status   . Vitamin D deficiency 01/07/2015  . [place] type 2 second degree atrioventricular block 12/12/2014  . Pacemaker 12/12/2014  . Symptomatic bradycardia 12/03/2014  . Lower back pain 06/12/2012  . Right hip pain 06/12/2012  . Intertrigo 05/09/2012  . Insomnia 04/19/2012  . General weakness 03/07/2012  . 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 Diarrhea 01/17/2012  . Pyelonephritis 01/15/2012  . Diabetic peripheral neuropathy ([place]) 09/28/2011  . Urinary incontinence 09/28/2011  . Preventative health care 09/23/2011  . Gout 09/23/2011  . Bipolar affective disorder ([place]) 09/23/2011  . TIA (transient ischemic attack) 09/23/2011  . Chronic pain 09/23/2011  . DNR (do not resuscitate) 09/23/2011  . Cerebral infarction ([place]) 09/23/2011  . 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 History of alcohol abuse 09/23/2011  . Benign prostatic hyperplasia   . Chronic systolic CHF (congestive heart failure) ([place]) 05/05/2011  . Edema 01/03/2011  . CAD (coronary artery disease) 05/18/2010  . Hyperlipidemia 05/18/2010  . Other abnormality of urination(788.69) 11/25/2009  . Other specified forms of chronic ischemic heart disease 05/19/2009  . Coronary artery disease involving coronary bypass graft of native heart with angina  pectoris ([place])  11/18/2008  . AMI, INFERIOR WALL 09/30/2008  . Obstructive sleep apnea 09/16/2008  . SINUS TACHYCARDIA 09/02/2008  . DM (diabetes mellitus), type 2 with peripheral vascular complications ([place]) 77/82/4235  . OBESITY 09/01/2008  . Hypertensive heart disease with heart failure ([place]) 09/01/2008  . TOBACCO ABUSE 07/31/2007  . 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    CMP     Component Value Date/Time   NA 144 01/23/2018   K 4.5 01/23/2018   CL 102 03/20/2015 1038   CO2 28 03/20/2015 1038   GLUCOSE 171 (H) 03/20/2015 1038   BUN 65 (A) 01/23/2018   CREATININE 2.7 (A) 01/23/2018   CREATININE 1.23 03/20/2015 1038   CREATININE 1.06 12/05/2014 1536   CALCIUM 9.6 03/20/2015 1038   PROT 5.8 (L) 02/01/2015 0245   ALBUMIN 2.2 (L) 02/01/2015 0245   AST 17 02/11/2017   ALT 6 (A) 02/11/2017   ALKPHOS 21 (A) 02/11/2017   BILITOT 0.4 02/01/2015 0245   GFRNONAA 55 (L) 03/20/2015 1038   GFRAA >60 03/20/2015 1038   Recent Labs    01/23/18  NA 144  K 4.5  BUN 65*  CREATININE 2.7*   No results for input(s): AST, ALT, ALKPHOS, BILITOT, PROT, ALBUMIN in the last 8760 hours. No results for input(s): WBC, NEUTROABS, HGB, HCT, MCV, PLT in the last 8760 hours. Recent Labs    01/23/18  CHOL 165  LDLCALC 63  TRIG 311*   Lab Results  Component Value Date   MICROALBUR >42.6 04/09/2016   Lab Results  Component Value Date   TSH 1.47 01/23/2018   Lab Results  Component Value Date   HGBA1C 7.6 01/23/2018   Lab Results  Component Value Date   CHOL 165 01/23/2018   HDL 39 01/23/2018   LDLCALC 63 01/23/2018   LDLDIRECT 85.2 06/13/2012   TRIG 311 (A) 01/23/2018   CHOLHDL 7 06/13/2012    Significant Diagnostic Results in last 30 days:  No results found.  Assessment and Plan  DM (diabetes mellitus), type 2 with peripheral vascular complications (HCC) Last A1c 7.6 which is beyond good for this patient; continue Toujeo 60 units daily with regular insulin 20 units with each meal  with sliding scale insulin for blood sugar greater than 200; patient is also on Glucophage 2000 mg twice daily; patient is on statin and ARB  Coronary artery disease involving coronary bypass graft of native heart with angina pectoris ([place]) CABG in 2010; no complaints of chest pain; continue ASA 81 mg daily, as needed nitroglycerin and Coreg 3.125 mg twice daily; patient is on statin  Chronic systolic CHF (congestive heart failure) (HCC) No exacerbations reported; continue Cozaar 50 mg daily, Coreg 3.125 mg twice daily and Lasix 40 mg daily     [name], MD

## 2018-06-30 ENCOUNTER — Encounter: Payer: Self-pay | Admitting: Internal Medicine

## 2018-06-30 NOTE — Assessment & Plan Note (Signed)
No exacerbations reported; continue Cozaar 50 mg daily, Coreg 3.125 mg twice daily and Lasix 40 mg daily

## 2018-06-30 NOTE — Assessment & Plan Note (Signed)
CABG in 2010; no complaints of chest pain; continue ASA 81 mg daily, as needed nitroglycerin and Coreg 3.125 mg twice daily; patient is on statin

## 2018-06-30 NOTE — Assessment & Plan Note (Signed)
Last A1c 7.6 which is beyond good for this patient; continue Toujeo 60 units daily with regular insulin 20 units with each meal with sliding scale insulin for blood sugar greater than 200; patient is also on Glucophage 2000 mg twice daily; patient is on statin and ARB

## 2018-07-02 ENCOUNTER — Other Ambulatory Visit: Payer: Self-pay | Admitting: Internal Medicine

## 2018-07-02 MED ORDER — TRAMADOL HCL 50 MG PO TABS: 100.0000 mg | ORAL_TABLET | Freq: Three times a day (TID) | ORAL | 0 refills | Status: DC

## 2018-07-09 ENCOUNTER — Encounter: Payer: Self-pay | Admitting: Internal Medicine

## 2018-07-09 ENCOUNTER — Non-Acute Institutional Stay (SKILLED_NURSING_FACILITY): Payer: Medicare Other | Admitting: Internal Medicine

## 2018-07-09 DIAGNOSIS — J181 Lobar pneumonia, unspecified organism: Secondary | ICD-10-CM

## 2018-07-09 DIAGNOSIS — N179 Acute kidney failure, unspecified: Secondary | ICD-10-CM

## 2018-07-09 DIAGNOSIS — J189 Pneumonia, unspecified organism: Secondary | ICD-10-CM

## 2018-07-09 NOTE — Progress Notes (Signed)
Location:  [facility] Room Number: 424-D Place of Service:  SNF (31)  [name], MD  Patient Care Team: [name], MD as PCP - General (Internal Medicine) [name], MD as Consulting Physician (Cardiology) [name], MD as Consulting Physician (Neurology)  Extended Emergency Contact Information Primary Emergency Contact: [name],[name] Address: [address]          [place], [place] [address] [name] of [place] Phone: [phone] Mobile Phone: [phone] Relation: Spouse Secondary Emergency Contact: [name], Corazon [name] of [place] Phone: [phone] Mobile Phone: [phone] Relation: Daughter    Allergies: Benzedrine [amphetamine], Dexedrine [dextroamphetamine sulfate er], Oxycodone, and Vancomycin  Chief Complaint  Patient presents with  . Acute Visit    Pneumonia    HPI: Patient is a 78 y.o. male who is being seen because he spiked a temperature of 102 on Friday.  Patient had minimal basilar atelectasis with small effusion but was placed in the isolation hall where he was treated with Z-Pak and probiotics.  Patient's WBC is 12.4 and his BUN 61.3 with creatinine of 3.21  Past Medical History:  Diagnosis Date  . 2nd degree AV block    a. s/p STJ dual chamber pacemaker 11/2014  . Acute respiratory failure ([place]) 01/26/2015  . AKI (acute kidney injury) ([place]) 01/26/2015  . AMI, INFERIOR WALL 09/30/2008   Qualifier: Diagnosis of  By: Unk Lightning, RN, BSN, Melanie    . Benign neoplasm of colon     Adenomatous polyps  . Benign prostatic hyperplasia   . Bipolar affective disorder ([place])    . Bipolar depression ([place]) 05/10/2015  . BPH (benign prostatic hypertrophy)   . CAD (coronary artery disease)    a. s/p CABG  . CAD, ARTERY BYPASS GRAFT 11/18/2008   Qualifier: Diagnosis of  By: Angelena Form, MD, Harrell Gave    . Chronic pain    . CVA (cerebral infarction)     Small  left occipital  . Dementia with behavioral disturbance ([place]) 02/15/2015  . Depression    . Diabetic peripheral neuropathy ([place]) 09/28/2011  . DM (diabetes mellitus), type 2 with peripheral vascular complications ([place]) 9/38/1829   Qualifier: Diagnosis of  By: Burnett Kanaris    . Gout    . History of alcohol abuse    . Hyperlipidemia    . HYPERTENSION        . Hypertensive heart disease with heart failure ([place]) 09/01/2008   Qualifier: Diagnosis of  By: Burnett Kanaris    . Insomnia 04/19/2012  . Ischemic cardiomyopathy    . Narcolepsy    . Obesity   . OBESITY 09/01/2008   Qualifier: Diagnosis of  By: Burnett Kanaris    . OBSTRUCTIVE SLEEP APNEA        . Obstructive sleep apnea 09/16/2008   Qualifier: Diagnosis of  By: [name] MD, [name]   . Pacemaker 12/12/2014  . Parkinsonism ([place]) 09/30/2015  . [facility] spotted fever   . SINUS TACHYCARDIA 09/02/2008   Qualifier: Diagnosis of  By: Caryl Comes, MD, [name]   . TIA (transient ischemic attack)    . TOBACCO ABUSE 07/31/2007   Qualifier: Diagnosis of  By: [name] MD, [name]   . Tremor 08/11/2015  . UNIVERSAL ULCERATIVE COLITIS        . Vitamin D deficiency 01/07/2015    Past Surgical History:  Procedure Laterality Date  . CARDIAC CATHETERIZATION  Triple-vessel coronary artery disease. Low-normal left ventricular systolic function with mild   anteroapical wall motion abnormality.   [name] Kitchen CARDIAC CATHETERIZATION N/A 01/28/2015   Procedure: Temporary Pacemaker;  Surgeon: [name], MD;  Location: [facility];  Service: Cardiovascular;  Laterality: N/A;  . CORONARY ARTERY BYPASS GRAFT     Median sternotomy, extracorporeal circulation,  coronary artery bypass graft surgery x4 using a sequential left internal   mammary artery graft to the mid and distal left anterior descending, a   saphenous vein graft to diagonal branch of the left anterior descending,   and a saphenous vein graft to the obtuse marginal branch of  left   circumflex coronary artery.    . EP IMPLANTABLE DEVICE N/A 12/12/2014   Procedure: Pacemaker Implant;  Surgeon: [name], MD;  Location: [facility];  Service: Cardiovascular;  Laterality: N/A;  . EP IMPLANTABLE DEVICE N/A 01/28/2015   Procedure: PPM Generator Removal;  Surgeon: [name], MD;  Location: [facility];  Service: Cardiovascular;  Laterality: N/A;  . EP IMPLANTABLE DEVICE N/A 03/20/2015   Procedure: Pacemaker Implant;  Surgeon: [name], MD;  Location: [facility];  Service: Cardiovascular;  Laterality: N/A;  . FLEXIBLE SIGMOIDOSCOPY  01/17/2012   Procedure: FLEXIBLE SIGMOIDOSCOPY;  Surgeon: Lafayette Dragon, MD;  Location: [facility];  Service: Endoscopy;  Laterality: N/A;  . MULTIPLE EXTRACTIONS WITH ALVEOLOPLASTY  12/01/2011   Procedure: MULTIPLE EXTRACION WITH ALVEOLOPLASTY;  Surgeon: Lenn Cal, DDS;  Location: [facility];  Service: Oral Surgery;  Laterality: N/A;  Extraction of tooth #'s 3,4,5,6,7,8,9,10,11,12,13,14,15,16,17,20,21,22,23,24,25,26,27,28,29,30,31,32 with alveoloplasty and bilateral mandibular lingual tori  . TONSILECTOMY, ADENOIDECTOMY, BILATERAL MYRINGOTOMY AND TUBES  1948    Allergies as of 07/09/2018      Reactions   Benzedrine [amphetamine] Nausea Only   Dexedrine [dextroamphetamine Sulfate Er] Other (See Comments)   agitation   Oxycodone Nausea And Vomiting   Vancomycin    Unknown - per [facility]      Medication List       Accurate as of July 09, 2018  4:17 PM. If you have any questions, ask your nurse or doctor.        STOP taking these medications   promethazine 25 MG tablet Commonly known as: PHENERGAN Stopped by: Inocencio Homes, MD     TAKE these medications   Afrin Nasal Spray 0.05 % nasal spray Generic drug: oxymetazoline Place 1 spray into both nostrils as needed (Nose bleed). Spray cotton ball and insert into nose as needed for nose bleed.   aspirin 81 MG chewable tablet Chew 81 mg by mouth at  bedtime.   azithromycin 250 MG tablet Commonly known as: ZITHROMAX 250 mg daily.   carbidopa-levodopa 25-100 MG tablet Commonly known as: SINEMET IR Take 2 tablets by mouth 3 (three) times daily with meals.   carvedilol 3.125 MG tablet Commonly known as: COREG Take 3.125 mg by mouth 2 (two) times daily with a meal. Hold for SBP less than 105   clonazePAM 0.5 MG tablet Commonly known as: KLONOPIN Take 1 tablet (0.5 mg total) by mouth at bedtime.   docusate sodium 100 MG capsule Commonly known as: COLACE Take 100 mg by mouth daily as needed for mild constipation.   donepezil 10 MG tablet Commonly known as: ARICEPT Take 10 mg by mouth at bedtime.   doxazosin 1 MG tablet Commonly known as: CARDURA Take 1 mg by mouth at bedtime.   fenofibrate 145 MG  tablet Commonly known as: TRICOR Take 145 mg by mouth daily. With a meal   ferrous sulfate 325 (65 FE) MG tablet Take 325 mg by mouth daily with breakfast.   Fish Oil 1000 MG Caps Take 1 capsule by mouth 3 (three) times daily.   furosemide 40 MG tablet Commonly known as: LASIX Take 40 mg by mouth daily.   gabapentin 400 MG capsule Commonly known as: NEURONTIN Take 400 mg by mouth 3 (three) times daily.   HumaLOG KwikPen 100 UNIT/ML KiwkPen Generic drug: insulin lispro Inject 20 Units into the skin 3 (three) times daily. Hold for CBG  < 200. ( to be given in addition to sliding scale) Check FSBS before meals and at bedtime. SSI 201-250 = 4 units, 251-300 = 6 units, 301-350 = 8 units, 351-400 = 10 units   lactose free nutrition Liqd Take 237 mLs by mouth daily.   lamoTRIgine 25 MG tablet Commonly known as: LAMICTAL Take 25 mg by mouth daily. give one tab with Lamictal 100 mg to equal 125 mg daily   lamoTRIgine 100 MG tablet Commonly known as: LAMICTAL Take 100 mg by mouth daily. Give 100 mg tablet along with 25 mg tablet to equal 125 mg total   Lexapro 5 MG tablet Generic drug: escitalopram Take 5 mg by mouth  daily.   losartan 50 MG tablet Commonly known as: COZAAR Take 50 mg by mouth every morning.   Magnesium Oxide 500 MG Tabs Take 1 tablet by mouth daily.   metFORMIN 1000 MG tablet Commonly known as: GLUCOPHAGE Take 1,000 mg by mouth 2 (two) times daily.   multivitamin tablet Take 1 tablet by mouth daily.   nitroGLYCERIN 0.4 MG SL tablet Commonly known as: NITROSTAT Place 0.4 mg under the tongue every 5 (five) minutes as needed for chest pain. x3 doses PRN, notify MD if no relief   omeprazole 20 MG tablet Commonly known as: PRILOSEC OTC Take 20 mg by mouth daily.   OXYGEN Inhale 2 L into the lungs continuous. To keep spo2 above 90%   polyethylene glycol 17 g packet Commonly known as: MIRALAX / GLYCOLAX Take 17 g by mouth daily.   pravastatin 40 MG tablet Commonly known as: PRAVACHOL Take 40 mg by mouth at bedtime.   primidone 50 MG tablet Commonly known as: MYSOLINE Take 25 mg by mouth. GIVE HALF TABLET (25MG ) BY MOUTH AT BEDTIME FOR ESSENTIAL TREMORS   Skin Prep Wipes Misc Apply to bilateral heels and protective foam dressing every 3 days   Toujeo Max SoloStar 300 UNIT/ML Sopn Generic drug: Insulin Glargine Inject 60 Units into the skin at bedtime. (PRIME PEN WITH 3 UNITS PRIOR TO EACH USE - DO NOT MIX WITH ANY OTHER INSULIN - USE WITHIN 42 DAYS AFTER OPENING   traMADol 50 MG tablet Commonly known as: ULTRAM Take 2 tablets (100 mg total) by mouth every 8 (eight) hours for 7 days. Then back to 50 mg q 8   traZODone 50 MG tablet Commonly known as: DESYREL Take 1 tablet (50 mg total) by mouth at bedtime.       No orders of the defined types were placed in this encounter.   Immunization History  Administered Date(s) Administered  . Influenza-Unspecified 10/28/2016, 10/07/2017  . PPD Test 03/20/2013  . Pneumococcal Conjugate-13 10/09/2015  . Pneumococcal Polysaccharide-23 09/02/2014    Social History   Tobacco Use  . Smoking status: Former Smoker     Packs/day: 2.00    Years: 50.00  Pack years: 100.00    Quit date: 08/18/2014    Years since quitting: 3.8  . Smokeless tobacco: Former Network engineer Use Topics  . Alcohol use: No    Comment: quit 2003    Review of Systems  DATA OBTAINED: from  GENERAL:  no fevers since Friday, fatigue, appetite changes SKIN: No itching, rash HEENT: No complaint RESPIRATORY: + cough, no wheezing, SOB CARDIAC: No chest pain, palpitations, lower extremity edema  GI: No abdominal pain, No N/V/D or constipation, No heartburn or reflux  GU: No dysuria, frequency or urgency, or incontinence  MUSCULOSKELETAL: No unrelieved bone/joint pain NEUROLOGIC: No headache, dizziness; patient is less responsive today PSYCHIATRIC: No overt anxiety or sadness  Vitals:   07/09/18 1542  BP: (!) 99/57  Pulse: 67  Resp: 19  Temp: (!) 97 F (36.1 C)  SpO2: 97%   Body mass index is 40.99 kg/m. Physical Exam  GENERAL APPEARANCE: Sleeping, No acute distress  SKIN: No diaphoresis rash HEENT: Unremarkable RESPIRATORY: Breathing is even, some work of breathing. Lung sounds are no wheezes no rails, slight rhonchi CARDIOVASCULAR: Heart RRR no murmurs, rubs or gallops. No peripheral edema  GASTROINTESTINAL: Abdomen is soft, non-tender, not distended w/ normal bowel sounds.  GENITOURINARY: Bladder non tender, not distended  MUSCULOSKELETAL: No abnormal joints or musculature NEUROLOGIC: Cranial nerves 2-12 grossly intact. Moves all extremities PSYCHIATRIC:  no behavioral issues  Patient Active Problem List   Diagnosis Date Noted  . GERD without esophagitis 05/03/2018  . Dysphagia, oropharyngeal 05/03/2018  . Hypertensive heart and renal disease, stage 1-4 or unspecified chronic kidney disease, with heart failure ([place]) 04/25/2018  . Chronic respiratory failure with hypoxia ([place]) 04/25/2018  . Type 2 diabetes mellitus with stage 3 chronic kidney disease and hypertension ([place]) 04/25/2018  . Dyslipidemia associated  with type 2 diabetes mellitus ([place]) 04/25/2018  . Chronic constipation 04/25/2018  . Essential tremor 04/25/2018  . CKD stage 3 due to type 2 diabetes mellitus ([place]) 04/25/2018  . Peripheral vascular disease ([place]) 02/04/2017  . Controlled diabetes mellitus type 2 with complications ([place]) 47/42/5956  . Aspiration pneumonia ([place]) 09/30/2015  . Parkinsonism ([place]) 09/30/2015  . Tremor 08/11/2015  . Suicidal ideation 05/10/2015  . Bipolar depression ([place]) 05/10/2015  . Complete heart block ([place]) 03/20/2015  . Bradycardia 03/14/2015  . Dementia with behavioral disturbance ([place]) 02/15/2015  . Staphylococcus aureus bacteremia with sepsis ([place]) 01/28/2015  . Infection of pacemaker pocket ([place]) 01/28/2015  . Acute respiratory failure ([place]) 01/26/2015  . AKI (acute kidney injury) ([place]) 01/26/2015  . Acute encephalopathy 01/26/2015  . Altered mental status   . Vitamin D deficiency 01/07/2015  . Mobitz type 2 second degree atrioventricular block 12/12/2014  . Pacemaker 12/12/2014  . Symptomatic bradycardia 12/03/2014  . Lower back pain 06/12/2012  . Right hip pain 06/12/2012  . Intertrigo 05/09/2012  . Insomnia 04/19/2012  . General weakness 03/07/2012  . 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 Diarrhea 01/17/2012  . Pyelonephritis 01/15/2012  . Diabetic peripheral neuropathy ([place]) 09/28/2011  . Urinary incontinence 09/28/2011  . Preventative health care 09/23/2011  . Gout 09/23/2011  . Bipolar affective disorder ([place]) 09/23/2011  . TIA (transient ischemic attack) 09/23/2011  . Chronic pain 09/23/2011  . DNR (do not resuscitate) 09/23/2011  . Cerebral infarction ([place]) 09/23/2011  . 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 History of alcohol abuse 09/23/2011  . Benign prostatic hyperplasia   . Chronic systolic CHF (congestive heart failure) ([place]) 05/05/2011  . Edema 01/03/2011  . CAD (coronary artery  disease) 05/18/2010  . Hyperlipidemia 05/18/2010  . Other abnormality of  urination(788.69) 11/25/2009  . Other specified forms of chronic ischemic heart disease 05/19/2009  . Coronary artery disease involving coronary bypass graft of native heart with angina pectoris ([place]) 11/18/2008  . AMI, INFERIOR WALL 09/30/2008  . Obstructive sleep apnea 09/16/2008  . SINUS TACHYCARDIA 09/02/2008  . DM (diabetes mellitus), type 2 with peripheral vascular complications ([place]) 64/40/3474  . OBESITY 09/01/2008  . Hypertensive heart disease with heart failure ([place]) 09/01/2008  . TOBACCO ABUSE 07/31/2007  . 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    CMP     Component Value Date/Time   NA 144 01/23/2018   K 4.5 01/23/2018   CL 102 03/20/2015 1038   CO2 28 03/20/2015 1038   GLUCOSE 171 (H) 03/20/2015 1038   BUN 65 (A) 01/23/2018   CREATININE 2.7 (A) 01/23/2018   CREATININE 1.23 03/20/2015 1038   CREATININE 1.06 12/05/2014 1536   CALCIUM 9.6 03/20/2015 1038   PROT 5.8 (L) 02/01/2015 0245   ALBUMIN 2.2 (L) 02/01/2015 0245   AST 17 02/11/2017   ALT 6 (A) 02/11/2017   ALKPHOS 21 (A) 02/11/2017   BILITOT 0.4 02/01/2015 0245   GFRNONAA 55 (L) 03/20/2015 1038   GFRAA >60 03/20/2015 1038   Recent Labs    01/23/18  NA 144  K 4.5  BUN 65*  CREATININE 2.7*   No results for input(s): AST, ALT, ALKPHOS, BILITOT, PROT, ALBUMIN in the last 8760 hours. No results for input(s): WBC, NEUTROABS, HGB, HCT, MCV, PLT in the last 8760 hours. Recent Labs    01/23/18  CHOL 165  LDLCALC 63  TRIG 311*   Lab Results  Component Value Date   MICROALBUR >42.6 04/09/2016   Lab Results  Component Value Date   TSH 1.47 01/23/2018   Lab Results  Component Value Date   HGBA1C 7.6 01/23/2018   Lab Results  Component Value Date   CHOL 165 01/23/2018   HDL 39 01/23/2018   LDLCALC 63 01/23/2018   LDLDIRECT 85.2 06/13/2012   TRIG 311 (A) 01/23/2018   CHOLHDL 7 06/13/2012    Significant Diagnostic Results in last 30 days:  No results found.  Assessment and Plan   Pneumonia/acute renal failure-will DC Z-Pak and start Rocephin 1 g IM for 7 days.  Start IV fluids with normal saline at 75 cc an hour for several days with BMP every morning.  I increased patient's O2 to 4 L nasal cannula to decrease work of breathing; he appears the patient's baseline creatinine is 2.2 as of 01/2017     [name], MD

## 2018-07-10 ENCOUNTER — Encounter: Payer: Self-pay | Admitting: Internal Medicine

## 2018-07-10 DIAGNOSIS — J189 Pneumonia, unspecified organism: Secondary | ICD-10-CM | POA: Insufficient documentation

## 2018-07-10 DIAGNOSIS — N179 Acute kidney failure, unspecified: Secondary | ICD-10-CM | POA: Insufficient documentation

## 2018-07-11 ENCOUNTER — Non-Acute Institutional Stay (SKILLED_NURSING_FACILITY): Payer: Medicare Other | Admitting: Internal Medicine

## 2018-07-11 ENCOUNTER — Encounter: Payer: Self-pay | Admitting: Internal Medicine

## 2018-07-11 DIAGNOSIS — L02212 Cutaneous abscess of back [any part, except buttock]: Secondary | ICD-10-CM | POA: Diagnosis not present

## 2018-07-11 NOTE — Progress Notes (Signed)
Location:  [facility] Room Number: 424-D Place of Service:  SNF (31)  [name], MD  Patient Care Team: [name], MD as PCP - General (Internal Medicine) [name], MD as Consulting Physician (Cardiology) [name], MD as Consulting Physician (Neurology)  Extended Emergency Contact Information Primary Emergency Contact: [name],[name] Address: [address]          [place], [place] [address] [name] of [place] Phone: [phone] Mobile Phone: [phone] Relation: Spouse Secondary Emergency Contact: [name], [name] of [place] Phone: [phone] Mobile Phone: [phone] Relation: Daughter    Allergies: Benzedrine [amphetamine], Dexedrine [dextroamphetamine sulfate er], Oxycodone, and Vancomycin  Chief Complaint  Patient presents with   Acute Visit    Patient is seen for an abscess of his left back.    HPI: Patient is a 78 y.o. male who the wound care nurse asked me to see.  Patient has an abscess on his back for 3- 4 days.  Patient has had no fever, does not complain of pain with it.  Rest  Past Medical History:  Diagnosis Date   2nd degree AV block    a. s/p STJ dual chamber pacemaker 11/2014   Acute respiratory failure ([place]) 01/26/2015   AKI (acute kidney injury) ([place]) 01/26/2015   AMI, INFERIOR WALL 09/30/2008   Qualifier: Diagnosis of  By: Unk Lightning, RN, BSN, Melanie     Benign neoplasm of colon     Adenomatous polyps   Benign prostatic hyperplasia    Bipolar affective disorder ([place])     Bipolar depression ([place]) 05/10/2015   BPH (benign prostatic hypertrophy)    CAD (coronary artery disease)    a. s/p CABG   CAD, ARTERY BYPASS GRAFT 11/18/2008   Qualifier: Diagnosis of  By: Angelena Form, MD, Christopher     Chronic pain     CVA (cerebral infarction)     Small left occipital   Dementia with behavioral disturbance ([place]) 02/15/2015    Depression     Diabetic peripheral neuropathy ([place]) 09/28/2011   DM (diabetes mellitus), type 2 with peripheral vascular complications ([place]) 1/75/1025   Qualifier: Diagnosis of  By: Burnett Kanaris     Gout     History of alcohol abuse     Hyperlipidemia     HYPERTENSION         Hypertensive heart disease with heart failure ([place]) 09/01/2008   Qualifier: Diagnosis of  By: Burnett Kanaris     Insomnia 04/19/2012   Ischemic cardiomyopathy     Narcolepsy     Obesity    OBESITY 09/01/2008   Qualifier: Diagnosis of  By: Barnes, [place]         Obstructive sleep apnea 09/16/2008   Qualifier: Diagnosis of  By: [name] MD, [name]    Pacemaker 12/12/2014   Parkinsonism ([place]) 09/30/2015   [place] spotted fever    SINUS TACHYCARDIA 09/02/2008   Qualifier: Diagnosis of  By: Caryl Comes, MD, Remus Blake    TIA (transient ischemic attack)     TOBACCO ABUSE 07/31/2007   Qualifier: Diagnosis of  By: [name] MD, [name]    Tremor 08/11/2015   UNIVERSAL ULCERATIVE COLITIS         Vitamin D deficiency 01/07/2015    Past Surgical History:  Procedure Laterality Date   CARDIAC CATHETERIZATION     Triple-vessel  coronary artery disease. Low-normal left ventricular systolic function with mild   anteroapical wall motion abnormality.    CARDIAC CATHETERIZATION N/A 01/28/2015   Procedure: Temporary Pacemaker;  Surgeon: [name], MD;  Location: [facility];  Service: Cardiovascular;  Laterality: N/A;   CORONARY ARTERY BYPASS GRAFT     Median sternotomy, extracorporeal circulation,  coronary artery bypass graft surgery x4 using a sequential left internal   mammary artery graft to the mid and distal left anterior descending, a   saphenous vein graft to diagonal branch of the left anterior descending,   and a saphenous vein graft to the obtuse marginal branch of left   circumflex coronary artery.     EP IMPLANTABLE DEVICE N/A  12/12/2014   Procedure: Pacemaker Implant;  Surgeon: [name], MD;  Location: [facility];  Service: Cardiovascular;  Laterality: N/A;   EP IMPLANTABLE DEVICE N/A 01/28/2015   Procedure: PPM Generator Removal;  Surgeon: [name], MD;  Location: [facility];  Service: Cardiovascular;  Laterality: N/A;   EP IMPLANTABLE DEVICE N/A 03/20/2015   Procedure: Pacemaker Implant;  Surgeon: [name], MD;  Location: [facility];  Service: Cardiovascular;  Laterality: N/A;   FLEXIBLE SIGMOIDOSCOPY  01/17/2012   Procedure: FLEXIBLE SIGMOIDOSCOPY;  Surgeon: Lafayette Dragon, MD;  Location: [facility];  Service: Endoscopy;  Laterality: N/A;   MULTIPLE EXTRACTIONS WITH ALVEOLOPLASTY  12/01/2011   Procedure: MULTIPLE EXTRACION WITH ALVEOLOPLASTY;  Surgeon: Lenn Cal, DDS;  Location: [facility];  Service: Oral Surgery;  Laterality: N/A;  Extraction of tooth #'s 3,4,5,6,7,8,9,10,11,12,13,14,15,16,17,20,21,22,23,24,25,26,27,28,29,30,31,32 with alveoloplasty and bilateral mandibular lingual tori   TONSILECTOMY, ADENOIDECTOMY, BILATERAL MYRINGOTOMY AND TUBES  1948    Allergies as of 07/11/2018      Reactions   Benzedrine [amphetamine] Nausea Only   Dexedrine [dextroamphetamine Sulfate Er] Other (See Comments)   agitation   Oxycodone Nausea And Vomiting   Vancomycin    Unknown - per [facility]      Medication List       Accurate as of July 11, 2018  4:25 PM. If you have any questions, ask your nurse or doctor.        STOP taking these medications   azithromycin 250 MG tablet Commonly known as: ZITHROMAX Stopped by: Inocencio Homes, MD     TAKE these medications   Afrin Nasal Spray 0.05 % nasal spray Generic drug: oxymetazoline Place 1 spray into both nostrils as needed (Nose bleed). Spray cotton ball and insert into nose as needed for nose bleed.   aspirin 81 MG chewable tablet Chew 81 mg by mouth at bedtime.   carbidopa-levodopa 25-100 MG tablet Commonly known  as: SINEMET IR Take 2 tablets by mouth 3 (three) times daily with meals.   carvedilol 3.125 MG tablet Commonly known as: COREG Take 3.125 mg by mouth 2 (two) times daily with a meal. Hold for SBP less than 105   cefTRIAXone 1 g injection Commonly known as: ROCEPHIN Inject 1 g into the muscle daily. x7 days What changed: Another medication with the same name was removed. Continue taking this medication, and follow the directions you see here. Changed by: Inocencio Homes, MD   clindamycin 300 MG capsule Commonly known as: CLEOCIN Take 300 mg by mouth every 6 (six) hours. x10 days for abscess to left shoulder   clonazePAM 0.5 MG tablet Commonly known as: KLONOPIN Take 1 tablet (0.5 mg total) by mouth at bedtime.   docusate sodium  100 MG capsule Commonly known as: COLACE Take 100 mg by mouth daily as needed for mild constipation.   donepezil 10 MG tablet Commonly known as: ARICEPT Take 10 mg by mouth at bedtime.   doxazosin 1 MG tablet Commonly known as: CARDURA Take 1 mg by mouth at bedtime.   fenofibrate 145 MG tablet Commonly known as: TRICOR Take 145 mg by mouth daily. With a meal   ferrous sulfate 325 (65 FE) MG tablet Take 325 mg by mouth daily with breakfast.   Fish Oil 1000 MG Caps Take 1 capsule by mouth 3 (three) times daily.   furosemide 40 MG tablet Commonly known as: LASIX Take 40 mg by mouth daily.   furosemide 20 MG tablet Commonly known as: LASIX Take 20 mg by mouth once. Take 1 dose on 07/11/18, along with his regular dose of 40 mg to = 60 mg   gabapentin 400 MG capsule Commonly known as: NEURONTIN Take 400 mg by mouth 3 (three) times daily.   guaiFENesin 600 MG 12 hr tablet Commonly known as: MUCINEX Take 600 mg by mouth 2 (two) times daily. Take for 7 days   HumaLOG KwikPen 100 UNIT/ML KiwkPen Generic drug: insulin lispro Inject 20 Units into the skin 3 (three) times daily. Hold for CBG  < 200. ( to be given in addition to sliding  scale) Check FSBS before meals and at bedtime. SSI 201-250 = 4 units, 251-300 = 6 units, 301-350 = 8 units, 351-400 = 10 units   ipratropium-albuterol 0.5-2.5 (3) MG/3ML Soln Commonly known as: DUONEB Take 3 mLs by nebulization See admin instructions. Every 4-6 hours for congestion and SOB   lactose free nutrition Liqd Take 237 mLs by mouth daily.   lamoTRIgine 25 MG tablet Commonly known as: LAMICTAL Take 25 mg by mouth daily. give one tab with Lamictal 100 mg to equal 125 mg daily   lamoTRIgine 100 MG tablet Commonly known as: LAMICTAL Take 100 mg by mouth daily. Give 100 mg tablet along with 25 mg tablet to equal 125 mg total   Lexapro 5 MG tablet Generic drug: escitalopram Take 5 mg by mouth daily.   losartan 50 MG tablet Commonly known as: COZAAR Take 50 mg by mouth every morning.   Magnesium Oxide 500 MG Tabs Take 1 tablet by mouth daily.   metFORMIN 1000 MG tablet Commonly known as: GLUCOPHAGE Take 1,000 mg by mouth 2 (two) times daily.   multivitamin tablet Take 1 tablet by mouth daily.   nitroGLYCERIN 0.4 MG SL tablet Commonly known as: NITROSTAT Place 0.4 mg under the tongue every 5 (five) minutes as needed for chest pain. x3 doses PRN, notify MD if no relief   omeprazole 20 MG tablet Commonly known as: PRILOSEC OTC Take 20 mg by mouth daily.   OXYGEN Inhale 2 L into the lungs continuous. To keep spo2 above 90%   polyethylene glycol 17 g packet Commonly known as: MIRALAX / GLYCOLAX Take 17 g by mouth daily.   pravastatin 40 MG tablet Commonly known as: PRAVACHOL Take 40 mg by mouth at bedtime.   primidone 50 MG tablet Commonly known as: MYSOLINE Take 25 mg by mouth. GIVE HALF TABLET (25MG ) BY MOUTH AT BEDTIME FOR ESSENTIAL TREMORS   promethazine 25 MG tablet Commonly known as: PHENERGAN Take 25 mg by mouth every 6 (six) hours as needed for nausea or vomiting. x3 days. Notify MD if symptoms persist for more than 24 hours   saccharomyces  boulardii 250 MG  capsule Commonly known as: FLORASTOR Take 250 mg by mouth daily.   Skin Prep Wipes Misc Apply to bilateral heels and protective foam dressing every 3 days   sodium chloride 0.9 % Soln 1,000 mLs by CRRT route once. 75 mL/hour x1 liters then discontinue after completion   Toujeo Max SoloStar 300 UNIT/ML Sopn Generic drug: Insulin Glargine Inject 60 Units into the skin at bedtime. (PRIME PEN WITH 3 UNITS PRIOR TO EACH USE - DO NOT MIX WITH ANY OTHER INSULIN - USE WITHIN 42 DAYS AFTER OPENING   traMADol 50 MG tablet Commonly known as: ULTRAM Take 50 mg by mouth every 8 (eight) hours. Give each shift What changed: Another medication with the same name was removed. Continue taking this medication, and follow the directions you see here. Changed by: Inocencio Homes, MD   traZODone 50 MG tablet Commonly known as: DESYREL Take 1 tablet (50 mg total) by mouth at bedtime.       No orders of the defined types were placed in this encounter.   Immunization History  Administered Date(s) Administered   Influenza-Unspecified 10/28/2016, 10/07/2017   PPD Test 03/20/2013   Pneumococcal Conjugate-13 10/09/2015   Pneumococcal Polysaccharide-23 09/02/2014    Social History   Tobacco Use   Smoking status: Former Smoker    Packs/day: 2.00    Years: 50.00    Pack years: 100.00    Quit date: 08/18/2014    Years since quitting: 3.8   Smokeless tobacco: Former Systems developer  Substance Use Topics   Alcohol use: No    Comment: quit 2003    Review of Systems  DATA OBTAINED: from patient, nurse GENERAL:  no fevers, fatigue, appetite changes SKIN: Nose is a little bit active as described in history present illness HEENT: No complaint RESPIRATORY: No cough, wheezing, SOB CARDIAC: No chest pain, palpitations, lower extremity edema  GI: No abdominal pain, No N/V/D or constipation, No heartburn or reflux  GU: No dysuria, frequency or urgency, or incontinence  MUSCULOSKELETAL: No  unrelieved bone/joint pain NEUROLOGIC: No headache, dizziness  PSYCHIATRIC: No overt anxiety or sadness  Vitals:   07/11/18 1458  BP: (!) 107/57  Pulse: 81  Resp: 18  Temp: (!) 97 F (36.1 C)  SpO2: 94%   Body mass index is 40.99 kg/m. Physical Exam  GENERAL APPEARANCE: Alert, conversant, No acute distress  SKIN: Approximately 4 x 6; stated no apparent openings but palpation does not yield fluctuant it is more like the area is now a pocket; minimal tenderness to palpation HEENT: Unremarkable RESPIRATORY: Breathing is even, unlabored. Lung sounds are clear   CARDIOVASCULAR: Heart RRR no murmurs, rubs or gallops. No peripheral edema  GASTROINTESTINAL: Abdomen is soft, non-tender, not distended w/ normal bowel sounds.  GENITOURINARY: Bladder non tender, not distended  MUSCULOSKELETAL: No abnormal joints or musculature NEUROLOGIC: Cranial nerves 2-12 grossly intact. Moves all extremities PSYCHIATRIC: Mood and affect appropriate to situation with dementia, no behavioral issues  Patient Active Problem List   Diagnosis Date Noted   Pneumonia 07/10/2018   Acute renal failure (ARF) ([place]) 07/10/2018   GERD without esophagitis 05/03/2018   Dysphagia, oropharyngeal 05/03/2018   Hypertensive heart and renal disease, stage 1-4 or unspecified chronic kidney disease, with heart failure ([place]) 04/25/2018   Chronic respiratory failure with hypoxia ([place]) 04/25/2018   Type 2 diabetes mellitus with stage 3 chronic kidney disease and hypertension ([place]) 04/25/2018   Dyslipidemia associated with type 2 diabetes mellitus ([place]) 04/25/2018   Chronic constipation 04/25/2018   Essential  tremor 04/25/2018   CKD stage 3 due to type 2 diabetes mellitus ([place]) 04/25/2018   Peripheral vascular disease ([place]) 02/04/2017   Controlled diabetes mellitus type 2 with complications ([place]) 85/46/2703   Aspiration pneumonia ([place]) 09/30/2015   Parkinsonism ([place]) 09/30/2015   Tremor 08/11/2015    Suicidal ideation 05/10/2015   Bipolar depression ([place]) 05/10/2015   Complete heart block ([facility]) 03/20/2015   Bradycardia 03/14/2015   Dementia with behavioral disturbance ([place]) 02/15/2015   Staphylococcus aureus bacteremia with sepsis ([place]) 01/28/2015   Infection of pacemaker pocket ([place]) 01/28/2015   Acute respiratory failure ([place]) 01/26/2015   AKI (acute kidney injury) ([place]) 01/26/2015   Acute encephalopathy 01/26/2015   Altered mental status    Vitamin D deficiency 01/07/2015   [place]itz type 2 second degree atrioventricular block 12/12/2014   Pacemaker 12/12/2014   Symptomatic bradycardia 12/03/2014   Lower back pain 06/12/2012   Right hip pain 06/12/2012   Intertrigo 05/09/2012   Insomnia 04/19/2012   General weakness 03/07/2012   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   Diarrhea 01/17/2012   Pyelonephritis 01/15/2012   Diabetic peripheral neuropathy ([place]) 09/28/2011   Urinary incontinence 09/28/2011   Preventative health care 09/23/2011   Gout 09/23/2011   Bipolar affective disorder ([place]) 09/23/2011   TIA (transient ischemic attack) 09/23/2011   Chronic pain 09/23/2011   DNR (do not resuscitate) 09/23/2011   Cerebral infarction ([place]) 09/23/2011   Narcolepsy 09/23/2011   History of alcohol abuse 09/23/2011   Benign prostatic hyperplasia    Chronic systolic CHF (congestive heart failure) ([place]) 05/05/2011   Edema 01/03/2011   CAD (coronary artery disease) 05/18/2010   Hyperlipidemia 05/18/2010   Other abnormality of urination(788.69) 11/25/2009   Other specified forms of chronic ischemic heart disease 05/19/2009   Coronary artery disease involving coronary bypass graft of native heart with angina pectoris ([place]) 11/18/2008   AMI, INFERIOR WALL 09/30/2008   Obstructive sleep apnea 09/16/2008   SINUS TACHYCARDIA 09/02/2008   DM (diabetes mellitus), type 2 with peripheral vascular complications ([place]) 50/09/3816    OBESITY 09/01/2008   Hypertensive heart disease with heart failure ([place]) 09/01/2008   TOBACCO ABUSE 07/31/2007   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    CMP     Component Value Date/Time   NA 144 01/23/2018   K 4.5 01/23/2018   CL 102 03/20/2015 1038   CO2 28 03/20/2015 1038   GLUCOSE 171 (H) 03/20/2015 1038   BUN 65 (A) 01/23/2018   CREATININE 2.7 (A) 01/23/2018   CREATININE 1.23 03/20/2015 1038   CREATININE 1.06 12/05/2014 1536   CALCIUM 9.6 03/20/2015 1038   PROT 5.8 (L) 02/01/2015 0245   ALBUMIN 2.2 (L) 02/01/2015 0245   AST 17 02/11/2017   ALT 6 (A) 02/11/2017   ALKPHOS 21 (A) 02/11/2017   BILITOT 0.4 02/01/2015 0245   GFRNONAA 55 (L) 03/20/2015 1038   GFRAA >60 03/20/2015 1038   Recent Labs    01/23/18  NA 144  K 4.5  BUN 65*  CREATININE 2.7*   No results for input(s): AST, ALT, ALKPHOS, BILITOT, PROT, ALBUMIN in the last 8760 hours. No results for input(s): WBC, NEUTROABS, HGB, HCT, MCV, PLT in the last 8760 hours. Recent Labs    01/23/18  CHOL 165  LDLCALC 63  TRIG 311*   Lab Results  Component Value Date   MICROALBUR >42.6 04/09/2016   Lab Results  Component Value Date   TSH 1.47 01/23/2018   Lab Results  Component Value Date   HGBA1C 7.6  01/23/2018   Lab Results  Component Value Date   CHOL 165 01/23/2018   HDL 39 01/23/2018   LDLCALC 63 01/23/2018   LDLDIRECT 85.2 06/13/2012   TRIG 311 (A) 01/23/2018   CHOLHDL 7 06/13/2012    Significant Diagnostic Results in last 30 days:  No results found.  Assessment and Plan  Abscess left back- infected ability to empty pocket did not make sense until I spoke to them nurse who was with him when it was discovered and she said there was a large amount of pus that was on the sheets and came out of the lesion, so effectively is already been drained; continue to monitor     [name], MD

## 2018-07-15 ENCOUNTER — Encounter: Payer: Self-pay | Admitting: Internal Medicine

## 2018-07-15 DIAGNOSIS — L02212 Cutaneous abscess of back [any part, except buttock]: Secondary | ICD-10-CM | POA: Insufficient documentation

## 2018-07-25 ENCOUNTER — Encounter: Payer: Self-pay | Admitting: Internal Medicine

## 2018-07-25 ENCOUNTER — Non-Acute Institutional Stay (SKILLED_NURSING_FACILITY): Payer: Medicare Other | Admitting: Internal Medicine

## 2018-07-25 DIAGNOSIS — I739 Peripheral vascular disease, unspecified: Secondary | ICD-10-CM | POA: Diagnosis not present

## 2018-07-25 DIAGNOSIS — I110 Hypertensive heart disease with heart failure: Secondary | ICD-10-CM

## 2018-07-25 DIAGNOSIS — I25708 Atherosclerosis of coronary artery bypass graft(s), unspecified, with other forms of angina pectoris: Secondary | ICD-10-CM

## 2018-07-25 NOTE — Progress Notes (Signed)
Location:  [facility] Room Number: 308-D Place of Service:  SNF (31)  [name], MD  Patient Care Team: [name], MD as PCP - General (Internal Medicine) [name], MD as Consulting Physician (Cardiology) [name], MD as Consulting Physician (Neurology)  Extended Emergency Contact Information Primary Emergency Contact: [name],[name] Address: [address]          [place], [place] [address] [name] of [place] Phone: [phone] Mobile Phone: [phone] Relation: Spouse Secondary Emergency Contact: [name], Dendron [name] of [place] Phone: [phone] Mobile Phone: [phone] Relation: Daughter    Allergies: Benzedrine [amphetamine], Dexedrine [dextroamphetamine sulfate er], Oxycodone, and Vancomycin  Chief Complaint  Patient presents with  . Medical Management of Chronic Issues    Routine [facility] visit  . Quality Metric Gaps    Hgb A1c    HPI: Patient is a 78 y.o. male who is being seen for routine issues of coronary artery disease, hypertension, and peripheral arterial disease.  Past Medical History:  Diagnosis Date  . 2nd degree AV block    a. s/p STJ dual chamber pacemaker 11/2014  . Acute respiratory failure ([place]) 01/26/2015  . AKI (acute kidney injury) ([place]) 01/26/2015  . AMI, INFERIOR WALL 09/30/2008   Qualifier: Diagnosis of  By: Unk Lightning, RN, BSN, Melanie    . Benign neoplasm of colon     Adenomatous polyps  . Benign prostatic hyperplasia   . Bipolar affective disorder ([place])    . Bipolar depression ([place]) 05/10/2015  . BPH (benign prostatic hypertrophy)   . CAD (coronary artery disease)    a. s/p CABG  . CAD, ARTERY BYPASS GRAFT 11/18/2008   Qualifier: Diagnosis of  By: Angelena Form, MD, Harrell Gave    . Chronic pain    . CVA (cerebral infarction)     Small left occipital  . Dementia with behavioral disturbance ([place]) 02/15/2015  . Depression     . Diabetic peripheral neuropathy ([place]) 09/28/2011  . DM (diabetes mellitus), type 2 with peripheral vascular complications ([place]) 6/81/1572   Qualifier: Diagnosis of  By: Burnett Kanaris    . Gout    . History of alcohol abuse    . Hyperlipidemia    . HYPERTENSION        . Hypertensive heart disease with heart failure ([place]) 09/01/2008   Qualifier: Diagnosis of  By: Burnett Kanaris    . Insomnia 04/19/2012  . Ischemic cardiomyopathy    . Narcolepsy    . Obesity   . OBESITY 09/01/2008   Qualifier: Diagnosis of  By: Burnett Kanaris    . OBSTRUCTIVE SLEEP APNEA        . Obstructive sleep apnea 09/16/2008   Qualifier: Diagnosis of  By: [name] MD, [name]   . Pacemaker 12/12/2014  . Parkinsonism ([place]) 09/30/2015  . [facility] spotted fever   . SINUS TACHYCARDIA 09/02/2008   Qualifier: Diagnosis of  By: Caryl Comes, MD, [name]   . TIA (transient ischemic attack)    . TOBACCO ABUSE 07/31/2007   Qualifier: Diagnosis of  By: [name] MD, [name]   . Tremor 08/11/2015  . UNIVERSAL ULCERATIVE COLITIS        . Vitamin D deficiency 01/07/2015    Past Surgical History:  Procedure Laterality Date  . CARDIAC CATHETERIZATION     Triple-vessel coronary artery disease. Low-normal left ventricular systolic function with mild  anteroapical wall motion abnormality.   [name] Kitchen CARDIAC CATHETERIZATION N/A 01/28/2015   Procedure: Temporary Pacemaker;  Surgeon: [name], MD;  Location: [facility];  Service: Cardiovascular;  Laterality: N/A;  . CORONARY ARTERY BYPASS GRAFT     Median sternotomy, extracorporeal circulation,  coronary artery bypass graft surgery x4 using a sequential left internal   mammary artery graft to the mid and distal left anterior descending, a   saphenous vein graft to diagonal branch of the left anterior descending,   and a saphenous vein graft to the obtuse marginal branch of left   circumflex coronary artery.    . EP IMPLANTABLE DEVICE N/A 12/12/2014    Procedure: Pacemaker Implant;  Surgeon: [name], MD;  Location: [facility];  Service: Cardiovascular;  Laterality: N/A;  . EP IMPLANTABLE DEVICE N/A 01/28/2015   Procedure: PPM Generator Removal;  Surgeon: [name], MD;  Location: [facility];  Service: Cardiovascular;  Laterality: N/A;  . EP IMPLANTABLE DEVICE N/A 03/20/2015   Procedure: Pacemaker Implant;  Surgeon: [name], MD;  Location: [facility];  Service: Cardiovascular;  Laterality: N/A;  . FLEXIBLE SIGMOIDOSCOPY  01/17/2012   Procedure: FLEXIBLE SIGMOIDOSCOPY;  Surgeon: Lafayette Dragon, MD;  Location: [facility];  Service: Endoscopy;  Laterality: N/A;  . MULTIPLE EXTRACTIONS WITH ALVEOLOPLASTY  12/01/2011   Procedure: MULTIPLE EXTRACION WITH ALVEOLOPLASTY;  Surgeon: Lenn Cal, DDS;  Location: [facility];  Service: Oral Surgery;  Laterality: N/A;  Extraction of tooth #'s 3,4,5,6,7,8,9,10,11,12,13,14,15,16,17,20,21,22,23,24,25,26,27,28,29,30,31,32 with alveoloplasty and bilateral mandibular lingual tori  . TONSILECTOMY, ADENOIDECTOMY, BILATERAL MYRINGOTOMY AND TUBES  1946    Allergies as of 07/25/2018      Reactions   Benzedrine [amphetamine] Nausea Only   Dexedrine [dextroamphetamine Sulfate Er] Other (See Comments)   agitation   Oxycodone Nausea And Vomiting   Vancomycin    Unknown - per [facility]      Medication List       Accurate as of July 25, 2018 11:59 PM. If you have any questions, ask your nurse or doctor.        STOP taking these medications   sodium chloride 0.9 % Soln Stopped by: Inocencio Homes, MD     TAKE these medications   Afrin Nasal Spray 0.05 % nasal spray Generic drug: oxymetazoline Place 1 spray into both nostrils as needed (Nose bleed). Spray cotton ball and insert into nose as needed for nose bleed.   aspirin 81 MG chewable tablet Chew 81 mg by mouth at bedtime.   carbidopa-levodopa 25-100 MG tablet Commonly known as: SINEMET IR Take 2 tablets by mouth 3  (three) times daily with meals.   carvedilol 3.125 MG tablet Commonly known as: COREG Take 3.125 mg by mouth 2 (two) times daily with a meal. Hold for SBP less than 105   clonazePAM 0.5 MG tablet Commonly known as: KLONOPIN Take 1 tablet (0.5 mg total) by mouth at bedtime.   docusate sodium 100 MG capsule Commonly known as: COLACE Take 100 mg by mouth daily as needed for mild constipation.   donepezil 10 MG tablet Commonly known as: ARICEPT Take 10 mg by mouth at bedtime.   doxazosin 1 MG tablet Commonly known as: CARDURA Take 1 mg by mouth at bedtime.   fenofibrate 145 MG tablet Commonly known as: TRICOR Take 145 mg by mouth daily. With a meal   ferrous sulfate 325 (65 FE) MG tablet Take 325 mg by mouth daily with  breakfast.   Fish Oil 1000 MG Caps Take 1 capsule by mouth 3 (three) times daily.   furosemide 40 MG tablet Commonly known as: LASIX Take 40 mg by mouth daily.   furosemide 20 MG tablet Commonly known as: LASIX Take 20 mg by mouth once. Take 1 dose on 07/11/18, along with his regular dose of 40 mg to = 60 mg   gabapentin 400 MG capsule Commonly known as: NEURONTIN Take 400 mg by mouth 3 (three) times daily.   HumaLOG KwikPen 100 UNIT/ML KiwkPen Generic drug: insulin lispro Inject 20 Units into the skin 3 (three) times daily. Hold for CBG  < 200. ( to be given in addition to sliding scale) Check FSBS before meals and at bedtime. SSI 201-250 = 4 units, 251-300 = 6 units, 301-350 = 8 units, 351-400 = 10 units   ipratropium-albuterol 0.5-2.5 (3) MG/3ML Soln Commonly known as: DUONEB Take 3 mLs by nebulization See admin instructions. 1 nebulizer treatment q4h prn congestion/SOB, every 6 hours prn congestion/SOB   lactose free nutrition Liqd Take 237 mLs by mouth daily.   lamoTRIgine 25 MG tablet Commonly known as: LAMICTAL Take 25 mg by mouth daily. give one tab with Lamictal 100 mg to equal 125 mg daily   lamoTRIgine 100 MG tablet Commonly known as:  LAMICTAL Take 100 mg by mouth daily. Give 100 mg tablet along with 25 mg tablet to equal 125 mg total   Lexapro 5 MG tablet Generic drug: escitalopram Take 5 mg by mouth daily.   losartan 50 MG tablet Commonly known as: COZAAR Take 50 mg by mouth every morning.   Magnesium Oxide 500 MG Tabs Take 1 tablet by mouth daily.   metFORMIN 1000 MG tablet Commonly known as: GLUCOPHAGE Take 1,000 mg by mouth 2 (two) times daily.   multivitamin tablet Take 1 tablet by mouth daily.   nitroGLYCERIN 0.4 MG SL tablet Commonly known as: NITROSTAT Place 0.4 mg under the tongue every 5 (five) minutes as needed for chest pain. x3 doses PRN, notify MD if no relief   omeprazole 20 MG tablet Commonly known as: PRILOSEC OTC Take 20 mg by mouth daily.   OXYGEN Inhale 2 L into the lungs continuous. To keep spo2 above 90%   polyethylene glycol 17 g packet Commonly known as: MIRALAX / GLYCOLAX Take 17 g by mouth daily.   pravastatin 40 MG tablet Commonly known as: PRAVACHOL Take 40 mg by mouth at bedtime.   primidone 50 MG tablet Commonly known as: MYSOLINE Take 25 mg by mouth. GIVE HALF TABLET (25MG ) BY MOUTH AT BEDTIME FOR ESSENTIAL TREMORS   promethazine 25 MG tablet Commonly known as: PHENERGAN Take 25 mg by mouth every 6 (six) hours as needed for nausea or vomiting. x3 doses. Notify MD if symptoms persist for more than 24 hours   Skin Prep Wipes Misc Apply to bilateral heels and protective foam dressing every 3 days   Toujeo Max SoloStar 300 UNIT/ML Sopn Generic drug: Insulin Glargine Inject 60 Units into the skin at bedtime. (PRIME PEN WITH 3 UNITS PRIOR TO EACH USE - DO NOT MIX WITH ANY OTHER INSULIN - USE WITHIN 42 DAYS AFTER OPENING   traMADol 50 MG tablet Commonly known as: ULTRAM Take 50 mg by mouth every 8 (eight) hours. Give each shift   traZODone 50 MG tablet Commonly known as: DESYREL Take 1 tablet (50 mg total) by mouth at bedtime.       No orders of the defined  types were placed in this encounter.   Immunization History  Administered Date(s) Administered  . Influenza-Unspecified 10/28/2016, 10/07/2017  . PPD Test 03/20/2013  . Pneumococcal Conjugate-13 10/09/2015  . Pneumococcal Polysaccharide-23 09/02/2014    Social History   Tobacco Use  . Smoking status: Former Smoker    Packs/day: 2.00    Years: 50.00    Pack years: 100.00    Quit date: 08/18/2014    Years since quitting: 3.9  . Smokeless tobacco: Former Network engineer Use Topics  . Alcohol use: No    Comment: quit 2003    Review of Systems  DATA OBTAINED: from nurse GENERAL:  no fevers, fatigue, appetite changes SKIN: No itching, rash HEENT: No complaint RESPIRATORY: No cough, wheezing, SOB CARDIAC: No chest pain, palpitations, lower extremity edema  GI: No abdominal pain, No N/V/D or constipation, No heartburn or reflux  GU: No dysuria, frequency or urgency, or incontinence  MUSCULOSKELETAL: No unrelieved bone/joint pain NEUROLOGIC: No headache, dizziness  PSYCHIATRIC: No overt anxiety or sadness  Vitals:   07/25/18 1536  BP: 123/66  Pulse: 84  Resp: 18  Temp: 97.9 F (36.6 C)  SpO2: 93%   Body mass index is 39.17 kg/m. Physical Exam  GENERAL APPEARANCE: Alert, conversant, No acute distress  SKIN: No diaphoresis rash HEENT: Unremarkable RESPIRATORY: Breathing is even, unlabored. Lung sounds are clear   CARDIOVASCULAR: Heart RRR no murmurs, rubs or gallops. No peripheral edema  GASTROINTESTINAL: Abdomen is soft, non-tender, not distended w/ normal bowel sounds.  GENITOURINARY: Bladder non tender, not distended  MUSCULOSKELETAL: No abnormal joints or musculature NEUROLOGIC: Cranial nerves 2-12 grossly intact. Moves all extremities PSYCHIATRIC: Mood and affect appropriate to situation with dementia, no behavioral issues  Patient Active Problem List   Diagnosis Date Noted  . Cutaneous abscess of back (any part, except buttock) 07/15/2018  . Pneumonia  07/10/2018  . Acute renal failure (ARF) ([place]) 07/10/2018  . GERD without esophagitis 05/03/2018  . Dysphagia, oropharyngeal 05/03/2018  . Hypertensive heart and renal disease, stage 1-4 or unspecified chronic kidney disease, with heart failure ([place]) 04/25/2018  . Chronic respiratory failure with hypoxia ([place]) 04/25/2018  . Type 2 diabetes mellitus with stage 3 chronic kidney disease and hypertension ([place]) 04/25/2018  . Dyslipidemia associated with type 2 diabetes mellitus ([place]) 04/25/2018  . Chronic constipation 04/25/2018  . Essential tremor 04/25/2018  . CKD stage 3 due to type 2 diabetes mellitus ([place]) 04/25/2018  . Peripheral vascular disease ([place]) 02/04/2017  . Controlled diabetes mellitus type 2 with complications ([place]) 93/26/7124  . Aspiration pneumonia ([place]) 09/30/2015  . Parkinsonism ([place]) 09/30/2015  . Tremor 08/11/2015  . Suicidal ideation 05/10/2015  . Bipolar depression ([place]) 05/10/2015  . Complete heart block ([place]) 03/20/2015  . Bradycardia 03/14/2015  . Dementia with behavioral disturbance ([place]) 02/15/2015  . Staphylococcus aureus bacteremia with sepsis ([place]) 01/28/2015  . Infection of pacemaker pocket ([place]) 01/28/2015  . Acute respiratory failure ([place]) 01/26/2015  . AKI (acute kidney injury) ([place]) 01/26/2015  . Acute encephalopathy 01/26/2015  . Altered mental status   . Vitamin D deficiency 01/07/2015  . Mobitz type 2 second degree atrioventricular block 12/12/2014  . Pacemaker 12/12/2014  . Symptomatic bradycardia 12/03/2014  . Lower back pain 06/12/2012  . Right hip pain 06/12/2012  . Intertrigo 05/09/2012  . Insomnia 04/19/2012  . General weakness 03/07/2012  . 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 Diarrhea 01/17/2012  . Pyelonephritis 01/15/2012  . Diabetic peripheral neuropathy ([place]) 09/28/2011  . Urinary incontinence 09/28/2011  .  Preventative health care 09/23/2011  . Gout 09/23/2011  . Bipolar affective disorder ([place]) 09/23/2011   . TIA (transient ischemic attack) 09/23/2011  . Chronic pain 09/23/2011  . DNR (do not resuscitate) 09/23/2011  . Cerebral infarction ([place]) 09/23/2011  . 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 History of alcohol abuse 09/23/2011  . Benign prostatic hyperplasia   . Chronic systolic CHF (congestive heart failure) ([place]) 05/05/2011  . Edema 01/03/2011  . CAD (coronary artery disease) 05/18/2010  . Hyperlipidemia 05/18/2010  . Other abnormality of urination(788.69) 11/25/2009  . Other specified forms of chronic ischemic heart disease 05/19/2009  . Coronary artery disease involving coronary bypass graft of native heart with angina pectoris ([place]) 11/18/2008  . AMI, INFERIOR WALL 09/30/2008  . Obstructive sleep apnea 09/16/2008  . SINUS TACHYCARDIA 09/02/2008  . DM (diabetes mellitus), type 2 with peripheral vascular complications ([place]) 45/40/9811  . OBESITY 09/01/2008  . Hypertensive heart disease with heart failure ([place]) 09/01/2008  . TOBACCO ABUSE 07/31/2007  . 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    CMP     Component Value Date/Time   NA 144 01/23/2018   K 4.5 01/23/2018   CL 102 03/20/2015 1038   CO2 28 03/20/2015 1038   GLUCOSE 171 (H) 03/20/2015 1038   BUN 65 (A) 01/23/2018   CREATININE 2.7 (A) 01/23/2018   CREATININE 1.23 03/20/2015 1038   CREATININE 1.06 12/05/2014 1536   CALCIUM 9.6 03/20/2015 1038   PROT 5.8 (L) 02/01/2015 0245   ALBUMIN 2.2 (L) 02/01/2015 0245   AST 17 02/11/2017   ALT 6 (A) 02/11/2017   ALKPHOS 21 (A) 02/11/2017   BILITOT 0.4 02/01/2015 0245   GFRNONAA 55 (L) 03/20/2015 1038   GFRAA >60 03/20/2015 1038   Recent Labs    01/23/18  NA 144  K 4.5  BUN 65*  CREATININE 2.7*   No results for input(s): AST, ALT, ALKPHOS, BILITOT, PROT, ALBUMIN in the last 8760 hours. No results for input(s): WBC, NEUTROABS, HGB, HCT, MCV, PLT in the last 8760 hours. Recent Labs    01/23/18  CHOL 165  LDLCALC 63  TRIG 311*   Lab Results  Component Value Date    MICROALBUR >42.6 04/09/2016   Lab Results  Component Value Date   TSH 1.47 01/23/2018   Lab Results  Component Value Date   HGBA1C 7.6 01/23/2018   Lab Results  Component Value Date   CHOL 165 01/23/2018   HDL 39 01/23/2018   LDLCALC 63 01/23/2018   LDLDIRECT 85.2 06/13/2012   TRIG 311 (A) 01/23/2018   CHOLHDL 7 06/13/2012    Significant Diagnostic Results in last 30 days:  No results found.  Assessment and Plan  CAD (coronary artery disease) No reported chest pain; continue ASA 81 mg daily, Coreg 3.125 mg twice daily, PRN sublingual nitroglycerin; patient is on statin  Hypertensive heart disease with heart failure (HCC) On multiple regimens including Cozaar 50 mg daily, Cardura 1 mg nightly, Lasix 40 mg daily, Coreg 3.125 mg twice daily; appears controlled on this regimen continue current regimen  Peripheral vascular disease ([place]) No reported problems; continue ASA 81 mg daily.  Patient is on statin, and his A1c normally runs below 8 which is actually very good for him; continue supportive care      [name], MD

## 2018-07-29 ENCOUNTER — Encounter: Payer: Self-pay | Admitting: Internal Medicine

## 2018-07-29 NOTE — Assessment & Plan Note (Signed)
No reported problems; continue ASA 81 mg daily.  Patient is on statin, and his A1c normally runs below 8 which is actually very good for him; continue supportive care

## 2018-07-29 NOTE — Assessment & Plan Note (Signed)
On multiple regimens including Cozaar 50 mg daily, Cardura 1 mg nightly, Lasix 40 mg daily, Coreg 3.125 mg twice daily; appears controlled on this regimen continue current regimen

## 2018-07-29 NOTE — Assessment & Plan Note (Signed)
No reported chest pain; continue ASA 81 mg daily, Coreg 3.125 mg twice daily, PRN sublingual nitroglycerin; patient is on statin

## 2018-07-31 LAB — LIPID PANEL
Cholesterol: 120 (ref 0–200)
HDL: 45 (ref 35–70)
LDL Cholesterol: 45
LDl/HDL Ratio: 2.6
Triglycerides: 148 (ref 40–160)

## 2018-07-31 LAB — VITAMIN D 25 HYDROXY (VIT D DEFICIENCY, FRACTURES): Vit D, 25-Hydroxy: 36.32

## 2018-08-03 ENCOUNTER — Non-Acute Institutional Stay (SKILLED_NURSING_FACILITY): Payer: Medicare Other | Admitting: Internal Medicine

## 2018-08-03 DIAGNOSIS — Z8659 Personal history of other mental and behavioral disorders: Secondary | ICD-10-CM | POA: Diagnosis not present

## 2018-08-06 LAB — BASIC METABOLIC PANEL
BUN: 66 — AB (ref 4–21)
Creatinine: 2.3 — AB (ref 0.6–1.3)
Glucose: 117
Potassium: 5.2 (ref 3.4–5.3)
Sodium: 138 (ref 137–147)

## 2018-08-06 LAB — CBC AND DIFFERENTIAL
HCT: 28 — AB (ref 41–53)
Hemoglobin: 9.3 — AB (ref 13.5–17.5)
Neutrophils Absolute: 5
Platelets: 244 (ref 150–399)
WBC: 6.2

## 2018-08-06 LAB — IRON,TIBC AND FERRITIN PANEL
%SAT: 21.5
Ferritin: 117.8
Iron: 71
TIBC: 328
UIBC: 258

## 2018-08-06 LAB — VITAMIN B12: Vitamin B-12: 361

## 2018-08-07 ENCOUNTER — Encounter: Payer: Self-pay | Admitting: Internal Medicine

## 2018-08-07 ENCOUNTER — Non-Acute Institutional Stay (SKILLED_NURSING_FACILITY): Payer: Medicare Other | Admitting: Internal Medicine

## 2018-08-07 DIAGNOSIS — E538 Deficiency of other specified B group vitamins: Secondary | ICD-10-CM | POA: Diagnosis not present

## 2018-08-07 NOTE — Progress Notes (Signed)
Location:  [facility] Service:   SNF  [name], MD  Patient Care Team: [name], MD as PCP - General (Internal Medicine) [name], MD as Consulting Physician (Cardiology) [name], MD as Consulting Physician (Neurology)  Extended Emergency Contact Information Primary Emergency Contact: [name],[name] Address: [address]          [place], [place] [address] [name] of [place] Phone: [phone] Mobile Phone: [phone] Relation: Spouse Secondary Emergency Contact: [name], [name] of [place] Phone: [phone] Mobile Phone: [phone] Relation: Daughter    Allergies: Benzedrine [amphetamine], Dexedrine [dextroamphetamine sulfate er], Oxycodone, and Vancomycin  Chief Complaint  Patient presents with  . Acute Visit    HPI: Patient is 78 y.o. male who is being seen today because the nurses report that he is less responsive than usual.  Patient is described as sleepy.  He falls asleep after he finishes talking to you he does wake to me from voice and he did actually start watching TV a little bit.  Patient refused IV fluids, he refused getting urine.  Nurses were does not report any fever chills nausea vomiting diarrhea or any other systemic symptom.  Past Medical History:  Diagnosis Date  . 2nd degree AV block    a. s/p STJ dual chamber pacemaker 11/2014  . Acute respiratory failure ([place]) 01/26/2015  . AKI (acute kidney injury) ([place]) 01/26/2015  . AMI, INFERIOR WALL 09/30/2008   Qualifier: Diagnosis of  By: Unk Lightning, RN, BSN, Melanie    . Benign neoplasm of colon     Adenomatous polyps  . Benign prostatic hyperplasia   . Bipolar affective disorder ([place])    . Bipolar depression ([place]) 05/10/2015  . BPH (benign prostatic hypertrophy)   . CAD (coronary artery disease)    a. s/p CABG  . CAD, ARTERY BYPASS GRAFT 11/18/2008   Qualifier: Diagnosis of  By:  Angelena Form, MD, Harrell Gave    . Chronic pain    . CVA (cerebral infarction)     Small left occipital  . Dementia with behavioral disturbance ([place]) 02/15/2015  . Depression    . Diabetic peripheral neuropathy ([place]) 09/28/2011  . DM (diabetes mellitus), type 2 with peripheral vascular complications ([place]) 5/36/6440   Qualifier: Diagnosis of  By: Burnett Kanaris    . Gout    . History of alcohol abuse    . Hyperlipidemia    . HYPERTENSION        . Hypertensive heart disease with heart failure ([place]) 09/01/2008   Qualifier: Diagnosis of  By: Burnett Kanaris    . Insomnia 04/19/2012  . Ischemic cardiomyopathy    . Narcolepsy    . Obesity   . OBESITY 09/01/2008   Qualifier: Diagnosis of  By: Burnett Kanaris    . OBSTRUCTIVE SLEEP APNEA        . Obstructive sleep apnea 09/16/2008   Qualifier: Diagnosis of  By: [name] MD, [name]   . Pacemaker 12/12/2014  . Parkinsonism ([place]) 09/30/2015  . [facility] spotted fever   . SINUS TACHYCARDIA 09/02/2008   Qualifier: Diagnosis of  By: Caryl Comes, MD, [name]   . TIA (transient ischemic attack)    . TOBACCO ABUSE 07/31/2007   Qualifier: Diagnosis of  By: [name] MD, [name]   . Tremor 08/11/2015  . UNIVERSAL ULCERATIVE COLITIS        .  Vitamin D deficiency 01/07/2015    Past Surgical History:  Procedure Laterality Date  . CARDIAC CATHETERIZATION     Triple-vessel coronary artery disease. Low-normal left ventricular systolic function with mild   anteroapical wall motion abnormality.   [name] Kitchen CARDIAC CATHETERIZATION N/A 01/28/2015   Procedure: Temporary Pacemaker;  Surgeon: [name], MD;  Location: [facility];  Service: Cardiovascular;  Laterality: N/A;  . CORONARY ARTERY BYPASS GRAFT     Median sternotomy, extracorporeal circulation,  coronary artery bypass graft surgery x4 using a sequential left internal   mammary artery graft to the mid and distal left anterior descending, a   saphenous vein graft to diagonal branch of the  left anterior descending,   and a saphenous vein graft to the obtuse marginal branch of left   circumflex coronary artery.    . EP IMPLANTABLE DEVICE N/A 12/12/2014   Procedure: Pacemaker Implant;  Surgeon: [name], MD;  Location: [facility];  Service: Cardiovascular;  Laterality: N/A;  . EP IMPLANTABLE DEVICE N/A 01/28/2015   Procedure: PPM Generator Removal;  Surgeon: [name], MD;  Location: [facility];  Service: Cardiovascular;  Laterality: N/A;  . EP IMPLANTABLE DEVICE N/A 03/20/2015   Procedure: Pacemaker Implant;  Surgeon: [name], MD;  Location: [facility];  Service: Cardiovascular;  Laterality: N/A;  . FLEXIBLE SIGMOIDOSCOPY  01/17/2012   Procedure: FLEXIBLE SIGMOIDOSCOPY;  Surgeon: Lafayette Dragon, MD;  Location: [facility];  Service: Endoscopy;  Laterality: N/A;  . MULTIPLE EXTRACTIONS WITH ALVEOLOPLASTY  12/01/2011   Procedure: MULTIPLE EXTRACION WITH ALVEOLOPLASTY;  Surgeon: Lenn Cal, DDS;  Location: [facility];  Service: Oral Surgery;  Laterality: N/A;  Extraction of tooth #'s 3,4,5,6,7,8,9,10,11,12,13,14,15,16,17,20,21,22,23,24,25,26,27,28,29,30,31,32 with alveoloplasty and bilateral mandibular lingual tori  . TONSILECTOMY, ADENOIDECTOMY, BILATERAL MYRINGOTOMY AND TUBES  1948    Allergies as of 08/03/2018      Reactions   Benzedrine [amphetamine] Nausea Only   Dexedrine [dextroamphetamine Sulfate Er] Other (See Comments)   agitation   Oxycodone Nausea And Vomiting   Vancomycin    Unknown - per [facility]      Medication List       Accurate as of August 03, 2018 11:59 PM. If you have any questions, ask your nurse or doctor.        Afrin Nasal Spray 0.05 % nasal spray Generic drug: oxymetazoline Place 1 spray into both nostrils as needed (Nose bleed). Spray cotton ball and insert into nose as needed for nose bleed.   aspirin 81 MG chewable tablet Chew 81 mg by mouth at bedtime.   carbidopa-levodopa 25-100 MG tablet Commonly known  as: SINEMET IR Take 2 tablets by mouth 3 (three) times daily with meals.   carvedilol 3.125 MG tablet Commonly known as: COREG Take 3.125 mg by mouth 2 (two) times daily with a meal. Hold for SBP less than 105   clonazePAM 0.5 MG tablet Commonly known as: KLONOPIN Take 1 tablet (0.5 mg total) by mouth at bedtime.   docusate sodium 100 MG capsule Commonly known as: COLACE Take 100 mg by mouth daily as needed for mild constipation.   donepezil 10 MG tablet Commonly known as: ARICEPT Take 10 mg by mouth at bedtime.   doxazosin 1 MG tablet Commonly known as: CARDURA Take 1 mg by mouth at bedtime.   fenofibrate 145 MG tablet Commonly known as: TRICOR Take 145 mg by mouth daily. With a meal   ferrous sulfate 325 (65  FE) MG tablet Take 325 mg by mouth daily with breakfast.   Fish Oil 1000 MG Caps Take 1 capsule by mouth 3 (three) times daily.   furosemide 40 MG tablet Commonly known as: LASIX Take 40 mg by mouth daily.   furosemide 20 MG tablet Commonly known as: LASIX Take 20 mg by mouth once. Take 1 dose on 07/11/18, along with his regular dose of 40 mg to = 60 mg   gabapentin 400 MG capsule Commonly known as: NEURONTIN Take 400 mg by mouth 3 (three) times daily.   HumaLOG KwikPen 100 UNIT/ML KiwkPen Generic drug: insulin lispro Inject 20 Units into the skin 3 (three) times daily. Hold for CBG  < 200. ( to be given in addition to sliding scale) Check FSBS before meals and at bedtime. SSI 201-250 = 4 units, 251-300 = 6 units, 301-350 = 8 units, 351-400 = 10 units   ipratropium-albuterol 0.5-2.5 (3) MG/3ML Soln Commonly known as: DUONEB Take 3 mLs by nebulization See admin instructions. q6h scheduled x3 days - ending 08/07/18  q4h prn   lactose free nutrition Liqd Take 237 mLs by mouth 2 (two) times daily between meals. Prefers chocolate   lamoTRIgine 25 MG tablet Commonly known as: LAMICTAL Take 25 mg by mouth daily. give one tab with Lamictal 100 mg to equal 125  mg daily   lamoTRIgine 100 MG tablet Commonly known as: LAMICTAL Take 100 mg by mouth daily. Give 100 mg tablet along with 25 mg tablet to equal 125 mg total   Lexapro 5 MG tablet Generic drug: escitalopram Take 5 mg by mouth daily.   losartan 50 MG tablet Commonly known as: COZAAR Take 50 mg by mouth every morning.   Magnesium Oxide 500 MG Tabs Take 1 tablet by mouth daily.   metFORMIN 1000 MG tablet Commonly known as: GLUCOPHAGE Take 1,000 mg by mouth 2 (two) times daily.   multivitamin tablet Take 1 tablet by mouth daily.   nitroGLYCERIN 0.4 MG SL tablet Commonly known as: NITROSTAT Place 0.4 mg under the tongue every 5 (five) minutes as needed for chest pain. x3 doses PRN, notify MD if no relief   omeprazole 20 MG tablet Commonly known as: PRILOSEC OTC Take 20 mg by mouth daily.   OXYGEN Inhale 2 L into the lungs continuous. To keep spo2 above 90%   polyethylene glycol 17 g packet Commonly known as: MIRALAX / GLYCOLAX Take 17 g by mouth daily.   pravastatin 40 MG tablet Commonly known as: PRAVACHOL Take 40 mg by mouth at bedtime.   primidone 50 MG tablet Commonly known as: MYSOLINE Take 25 mg by mouth at bedtime. 0.5 tablet to = 25 mg   promethazine 25 MG tablet Commonly known as: PHENERGAN Take 25 mg by mouth every 6 (six) hours as needed for nausea or vomiting. x3 doses. Notify MD if symptoms persist for more than 24 hours   Skin Prep Wipes Misc Apply to bilateral heels and protective foam dressing every 3 days   Toujeo Max SoloStar 300 UNIT/ML Sopn Generic drug: Insulin Glargine Inject 60 Units into the skin at bedtime. (PRIME PEN WITH 3 UNITS PRIOR TO EACH USE - DO NOT MIX WITH ANY OTHER INSULIN - USE WITHIN 42 DAYS AFTER OPENING   traMADol 50 MG tablet Commonly known as: ULTRAM Take 50 mg by mouth every 8 (eight) hours. Give each shift   traZODone 50 MG tablet Commonly known as: DESYREL Take 1 tablet (50 mg total) by mouth at  bedtime.        No orders of the defined types were placed in this encounter.   Immunization History  Administered Date(s) Administered  . Influenza-Unspecified 10/28/2016, 10/07/2017  . PPD Test 03/20/2013  . Pneumococcal Conjugate-13 10/09/2015  . Pneumococcal Polysaccharide-23 09/02/2014    Social History   Tobacco Use  . Smoking status: Former Smoker    Packs/day: 2.00    Years: 50.00    Pack years: 100.00    Quit date: 08/18/2014    Years since quitting: 3.9  . Smokeless tobacco: Former Network engineer Use Topics  . Alcohol use: No    Comment: quit 2003    Review of Systems  DATA OBTAINED: from patient limited; nursing-as per history of present illness GENERAL:  no fevers, fatigue, appetite changes; more sleepy than usual SKIN: No itching, rash HEENT: No complaint RESPIRATORY: No cough, wheezing, SOB CARDIAC: No chest pain, palpitations, lower extremity edema  GI: No abdominal pain, No N/V/D or constipation, No heartburn or reflux  GU: No dysuria, frequency or urgency, or incontinence  MUSCULOSKELETAL: No unrelieved bone/joint pain NEUROLOGIC: No headache, dizziness  PSYCHIATRIC: No overt anxiety or sadness  Vitals:   08/07/18 1550  BP: (!) 98/56  Pulse: 67  Resp: 18  Temp: 99.1 F (37.3 C)   There is no height or weight on file to calculate BMI. Physical Exam  GENERAL APPEARANCE: Awaken to voice for me, started watching TV, no acute distress  SKIN: No diaphoresis rash HEENT: Unremarkable RESPIRATORY: Breathing is even, unlabored. Lung sounds are clear   CARDIOVASCULAR: Heart RRR no murmurs, rubs or gallops.  Trace peripheral edema  GASTROINTESTINAL: Abdomen is soft, non-tender, not distended w/ normal bowel sounds.  GENITOURINARY: Bladder non tender, not distended  MUSCULOSKELETAL: No abnormal joints or musculature NEUROLOGIC: Cranial nerves 2-12 grossly intact. Moves all extremities PSYCHIATRIC: Mood and affect appropriate to situation with dementia, no behavioral  issues  Patient Active Problem List   Diagnosis Date Noted  . Cutaneous abscess of back (any part, except buttock) 07/15/2018  . Pneumonia 07/10/2018  . Acute renal failure (ARF) ([place]) 07/10/2018  . GERD without esophagitis 05/03/2018  . Dysphagia, oropharyngeal 05/03/2018  . Hypertensive heart and renal disease, stage 1-4 or unspecified chronic kidney disease, with heart failure ([place]) 04/25/2018  . Chronic respiratory failure with hypoxia ([place]) 04/25/2018  . Type 2 diabetes mellitus with stage 3 chronic kidney disease and hypertension ([place]) 04/25/2018  . Dyslipidemia associated with type 2 diabetes mellitus ([place]) 04/25/2018  . Chronic constipation 04/25/2018  . Essential tremor 04/25/2018  . CKD stage 3 due to type 2 diabetes mellitus ([place]) 04/25/2018  . Peripheral vascular disease ([place]) 02/04/2017  . Controlled diabetes mellitus type 2 with complications ([place]) 01/00/7121  . Aspiration pneumonia ([place]) 09/30/2015  . Parkinsonism ([place]) 09/30/2015  . Tremor 08/11/2015  . Suicidal ideation 05/10/2015  . Bipolar depression ([place]) 05/10/2015  . Complete heart block ([place]) 03/20/2015  . Bradycardia 03/14/2015  . Dementia with behavioral disturbance ([place]) 02/15/2015  . Staphylococcus aureus bacteremia with sepsis ([place]) 01/28/2015  . Infection of pacemaker pocket ([place]) 01/28/2015  . Acute respiratory failure ([place]) 01/26/2015  . AKI (acute kidney injury) ([place]) 01/26/2015  . Acute encephalopathy 01/26/2015  . Altered mental status   . Vitamin D deficiency 01/07/2015  . Mobitz type 2 second degree atrioventricular block 12/12/2014  . Pacemaker 12/12/2014  . Symptomatic bradycardia 12/03/2014  . Lower back pain 06/12/2012  . Right hip pain 06/12/2012  . Intertrigo 05/09/2012  .  Insomnia 04/19/2012  . General weakness 03/07/2012  . 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 Diarrhea 01/17/2012  . Pyelonephritis 01/15/2012  . Diabetic peripheral neuropathy ([place]) 09/28/2011   . Urinary incontinence 09/28/2011  . Preventative health care 09/23/2011  . Gout 09/23/2011  . Bipolar affective disorder ([place]) 09/23/2011  . TIA (transient ischemic attack) 09/23/2011  . Chronic pain 09/23/2011  . DNR (do not resuscitate) 09/23/2011  . Cerebral infarction ([place]) 09/23/2011  . 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 History of alcohol abuse 09/23/2011  . Benign prostatic hyperplasia   . Chronic systolic CHF (congestive heart failure) ([place]) 05/05/2011  . Edema 01/03/2011  . CAD (coronary artery disease) 05/18/2010  . Hyperlipidemia 05/18/2010  . Other abnormality of urination(788.69) 11/25/2009  . Other specified forms of chronic ischemic heart disease 05/19/2009  . Coronary artery disease involving coronary bypass graft of native heart with angina pectoris ([place]) 11/18/2008  . AMI, INFERIOR WALL 09/30/2008  . Obstructive sleep apnea 09/16/2008  . SINUS TACHYCARDIA 09/02/2008  . DM (diabetes mellitus), type 2 with peripheral vascular complications ([place]) 42/35/3614  . OBESITY 09/01/2008  . Hypertensive heart disease with heart failure ([place]) 09/01/2008  . TOBACCO ABUSE 07/31/2007  . 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    CMP     Component Value Date/Time   NA 144 01/23/2018   K 4.5 01/23/2018   CL 102 03/20/2015 1038   CO2 28 03/20/2015 1038   GLUCOSE 171 (H) 03/20/2015 1038   BUN 65 (A) 01/23/2018   CREATININE 2.7 (A) 01/23/2018   CREATININE 1.23 03/20/2015 1038   CREATININE 1.06 12/05/2014 1536   CALCIUM 9.6 03/20/2015 1038   PROT 5.8 (L) 02/01/2015 0245   ALBUMIN 2.2 (L) 02/01/2015 0245   AST 17 02/11/2017   ALT 6 (A) 02/11/2017   ALKPHOS 21 (A) 02/11/2017   BILITOT 0.4 02/01/2015 0245   GFRNONAA 55 (L) 03/20/2015 1038   GFRAA >60 03/20/2015 1038   Recent Labs    01/23/18  NA 144  K 4.5  BUN 65*  CREATININE 2.7*   No results for input(s): AST, ALT, ALKPHOS, BILITOT, PROT, ALBUMIN in the last 8760 hours. No results for input(s): WBC, NEUTROABS,  HGB, HCT, MCV, PLT in the last 8760 hours. Recent Labs    01/23/18  CHOL 165  LDLCALC 63  TRIG 311*   Lab Results  Component Value Date   MICROALBUR >42.6 04/09/2016   Lab Results  Component Value Date   TSH 1.47 01/23/2018   Lab Results  Component Value Date   HGBA1C 7.6 01/23/2018   Lab Results  Component Value Date   CHOL 165 01/23/2018   HDL 39 01/23/2018   LDLCALC 63 01/23/2018   LDLDIRECT 85.2 06/13/2012   TRIG 311 (A) 01/23/2018   CHOLHDL 7 06/13/2012    Significant Diagnostic Results in last 30 days:  No results found.  Assessment and Plan  Mental status change-reported earlier this morning, now patient awakens to voice and watch TV for a while.  Patient refused IV fluid prior to return he did allow blood to be drawn.  His chest x-ray was negative.  Prednisone 40 mg for 5 days was started for possible COPD exacerbation although I do not hear any wheezing; will continue to monitor    [name], MD

## 2018-08-07 NOTE — Progress Notes (Signed)
This encounter was created in error - please disregard.

## 2018-08-07 NOTE — Progress Notes (Signed)
Location:  [facility] Room Number: 308-D Place of Service:  SNF (31)  Victor Duos, [name]  Patient Care Team: Victor Duos, [name] as PCP - General (Internal Medicine) Victor Lance, [name] as Consulting Physician (Cardiology) Victor Bombard, [name] as Consulting Physician (Neurology)  Extended Emergency Contact Information Primary Emergency Contact: [name] Address: [address]          [place], [place] [address] [name] of [place] Phone: [phone] Mobile Phone: [phone] Relation: Spouse Secondary Emergency Contact: [name], [name] of [place] Phone: [phone] Mobile Phone: [phone] Relation: Daughter    Allergies: Benzedrine [amphetamine], Dexedrine [dextroamphetamine sulfate er], Oxycodone, and Vancomycin  Chief Complaint  Patient presents with  . Acute Visit    Patient is seen for anemia.     HPI: Patient is a 78 y.o. male who is being seen for a folic acid of less than 1.  Patient with known anemia patient has no complaints.  Past Medical History:  Diagnosis Date  . 2nd degree AV block    a. s/p STJ dual chamber pacemaker 11/2014  . Acute respiratory failure ([place]) 01/26/2015  . AKI (acute kidney injury) ([place]) 01/26/2015  . AMI, INFERIOR WALL 09/30/2008   Qualifier: Diagnosis of  By: Victor Lightning, RN, BSN, [name]    . Benign neoplasm of colon     Adenomatous polyps  . Benign prostatic hyperplasia   . Bipolar affective disorder ([place])    . Bipolar depression ([place]) 05/10/2015  . BPH (benign prostatic hypertrophy)   . CAD (coronary artery disease)    a. s/p CABG  . CAD, ARTERY BYPASS GRAFT 11/18/2008   Qualifier: Diagnosis of  By: Victor Form, [name], [name]    . Chronic pain    . CVA (cerebral infarction)     Small left occipital  . Dementia with behavioral disturbance ([place]) 02/15/2015  . Depression    . Diabetic peripheral neuropathy ([place]) 09/28/2011  . DM (diabetes  mellitus), type 2 with peripheral vascular complications ([place]) 01/18/7508   Qualifier: Diagnosis of  By: [name]    . Gout    . History of alcohol abuse    . Hyperlipidemia    . HYPERTENSION        . Hypertensive heart disease with heart failure ([place]) 09/01/2008   Qualifier: Diagnosis of  By: [name]    . Insomnia 04/19/2012  . Ischemic cardiomyopathy    . Narcolepsy    . Obesity   . OBESITY 09/01/2008   Qualifier: Diagnosis of  By: [name]    . OBSTRUCTIVE SLEEP APNEA        . Obstructive sleep apnea 09/16/2008   Qualifier: Diagnosis of  By: [name], [name]   . Pacemaker 12/12/2014  . Parkinsonism ([place]) 09/30/2015  . [facility] spotted fever   . SINUS TACHYCARDIA 09/02/2008   Qualifier: Diagnosis of  By: Victor Comes, [name], [name]   . TIA (transient ischemic attack)    . TOBACCO ABUSE 07/31/2007   Qualifier: Diagnosis of  By: Victor Ina [name], [name]   . Tremor 08/11/2015  . UNIVERSAL ULCERATIVE COLITIS        . Vitamin D deficiency 01/07/2015    Past Surgical History:  Procedure Laterality Date  . CARDIAC CATHETERIZATION     Triple-vessel coronary artery disease. Low-normal left ventricular systolic function with mild   anteroapical wall motion abnormality.   [name] Kitchen  CARDIAC CATHETERIZATION N/A 01/28/2015   Procedure: Temporary Pacemaker;  Surgeon: Victor Lance, [name];  Location: [facility];  Service: Cardiovascular;  Laterality: N/A;  . CORONARY ARTERY BYPASS GRAFT     Median sternotomy, extracorporeal circulation,  coronary artery bypass graft surgery x4 using a sequential left internal   mammary artery graft to the mid and distal left anterior descending, a   saphenous vein graft to diagonal branch of the left anterior descending,   and a saphenous vein graft to the obtuse marginal branch of left   circumflex coronary artery.    . EP IMPLANTABLE DEVICE N/A 12/12/2014   Procedure: Pacemaker Implant;  Surgeon: Victor Lance, [name];  Location: [facility];  Service: Cardiovascular;  Laterality: N/A;  . EP IMPLANTABLE DEVICE N/A 01/28/2015   Procedure: PPM Generator Removal;  Surgeon: Victor Lance, [name];  Location: [facility];  Service: Cardiovascular;  Laterality: N/A;  . EP IMPLANTABLE DEVICE N/A 03/20/2015   Procedure: Pacemaker Implant;  Surgeon: Victor Lance, [name];  Location: [facility];  Service: Cardiovascular;  Laterality: N/A;  . FLEXIBLE SIGMOIDOSCOPY  01/17/2012   Procedure: FLEXIBLE SIGMOIDOSCOPY;  Surgeon: Victor Dragon, [name];  Location: [facility];  Service: Endoscopy;  Laterality: N/A;  . MULTIPLE EXTRACTIONS WITH ALVEOLOPLASTY  12/01/2011   Procedure: MULTIPLE EXTRACION WITH ALVEOLOPLASTY;  Surgeon: [name], [name];  Location: [facility];  Service: Oral Surgery;  Laterality: N/A;  Extraction of tooth #'s 3,4,5,6,7,8,9,10,11,12,13,14,15,16,17,20,21,22,23,24,25,26,27,28,29,30,31,32 with alveoloplasty and bilateral mandibular lingual tori  . TONSILECTOMY, ADENOIDECTOMY, BILATERAL MYRINGOTOMY AND TUBES  1948    Allergies as of 08/07/2018      Reactions   Benzedrine [amphetamine] Nausea Only   Dexedrine [dextroamphetamine Sulfate Er] Other (See Comments)   agitation   Oxycodone Nausea And Vomiting   Vancomycin    Unknown - per [facility]      Medication List       Accurate as of August 07, 2018 11:59 PM. If you have any questions, ask your nurse or doctor.        Afrin Nasal Spray 0.05 % nasal spray Generic drug: oxymetazoline Place 1 spray into both nostrils as needed (Nose bleed). Spray cotton ball and insert into nose as needed for nose bleed.   aspirin 81 MG chewable tablet Chew 81 mg by mouth at bedtime.   carbidopa-levodopa 25-100 MG tablet Commonly known as: SINEMET IR Take 2 tablets by mouth 3 (three) times daily with meals.   carvedilol 3.125 MG tablet Commonly known as: COREG Take 3.125 mg by mouth 2 (two) times daily with a meal. Hold for SBP less than 105   clonazePAM 0.5 MG tablet  Commonly known as: KLONOPIN Take 1 tablet (0.5 mg total) by mouth at bedtime.   docusate sodium 100 MG capsule Commonly known as: COLACE Take 100 mg by mouth daily as needed for mild constipation.   donepezil 10 MG tablet Commonly known as: ARICEPT Take 10 mg by mouth at bedtime.   doxazosin 1 MG tablet Commonly known as: CARDURA Take 1 mg by mouth at bedtime.   fenofibrate 145 MG tablet Commonly known as: TRICOR Take 145 mg by mouth daily. With a meal   ferrous sulfate 325 (65 FE) MG tablet Take 325 mg by mouth daily with breakfast.   Fish Oil 1000 MG Caps Take 1 capsule by mouth 3 (three) times daily.   furosemide 40 MG tablet Commonly known as: LASIX Take 40 mg by mouth  daily. What changed: Another medication with the same name was removed. Continue taking this medication, and follow the directions you see here. Changed by: Inocencio Homes, [name]   gabapentin 400 MG capsule Commonly known as: NEURONTIN Take 400 mg by mouth 3 (three) times daily.   HumaLOG KwikPen 100 UNIT/ML KiwkPen Generic drug: insulin lispro Inject 20 Units into the skin 3 (three) times daily. Hold for CBG  < 200. ( to be given in addition to sliding scale) Check FSBS before meals and at bedtime. SSI 201-250 = 4 units, 251-300 = 6 units, 301-350 = 8 units, 351-400 = 10 units   ipratropium-albuterol 0.5-2.5 (3) MG/3ML Soln Commonly known as: DUONEB Take 3 mLs by nebulization See admin instructions. q6h scheduled x3 days - ending 08/07/18  q4h prn   lactose free nutrition Liqd Take 237 mLs by mouth 2 (two) times daily between meals. Prefers chocolate   lamoTRIgine 25 MG tablet Commonly known as: LAMICTAL Take 25 mg by mouth daily. give one tab with Lamictal 100 mg to equal 125 mg daily   lamoTRIgine 100 MG tablet Commonly known as: LAMICTAL Take 100 mg by mouth daily. Give 100 mg tablet along with 25 mg tablet to equal 125 mg total   Lexapro 5 MG tablet Generic drug: escitalopram Take 5 mg  by mouth daily.   losartan 50 MG tablet Commonly known as: COZAAR Take 50 mg by mouth every morning.   Magnesium Oxide 500 MG Tabs Take 1 tablet by mouth daily.   metFORMIN 1000 MG tablet Commonly known as: GLUCOPHAGE Take 1,000 mg by mouth 2 (two) times daily.   multivitamin tablet Take 1 tablet by mouth daily.   nitroGLYCERIN 0.4 MG SL tablet Commonly known as: NITROSTAT Place 0.4 mg under the tongue every 5 (five) minutes as needed for chest pain. x3 doses PRN, notify [name] if no relief   omeprazole 20 MG tablet Commonly known as: PRILOSEC OTC Take 20 mg by mouth daily.   OXYGEN Inhale 2 L into the lungs continuous. To keep spo2 above 90%   polyethylene glycol 17 g packet Commonly known as: MIRALAX / GLYCOLAX Take 17 g by mouth daily.   pravastatin 40 MG tablet Commonly known as: PRAVACHOL Take 40 mg by mouth at bedtime.   primidone 50 MG tablet Commonly known as: MYSOLINE Take 25 mg by mouth at bedtime. 0.5 tablet to = 25 mg   promethazine 25 MG tablet Commonly known as: PHENERGAN Take 25 mg by mouth every 6 (six) hours as needed for nausea or vomiting. x3 doses. Notify [name] if symptoms persist for more than 24 hours   Skin Prep Wipes Misc Apply to bilateral heels and protective foam dressing every 3 days   Toujeo Max SoloStar 300 UNIT/ML Sopn Generic drug: Insulin Glargine Inject 60 Units into the skin at bedtime. (PRIME PEN WITH 3 UNITS PRIOR TO EACH USE - DO NOT MIX WITH ANY OTHER INSULIN - USE WITHIN 42 DAYS AFTER OPENING   traMADol 50 MG tablet Commonly known as: ULTRAM Take 50 mg by mouth every 8 (eight) hours. Give each shift   traZODone 50 MG tablet Commonly known as: DESYREL Take 1 tablet (50 mg total) by mouth at bedtime.   vitamin C 500 MG tablet Commonly known as: ASCORBIC ACID Take 1,000 mg by mouth daily. 2 tablets to = 1000 mg       No orders of the defined types were placed in this encounter.   Immunization History  Administered  Date(s) Administered  . Influenza-Unspecified 10/28/2016, 10/07/2017  . PPD Test 03/20/2013  . Pneumococcal Conjugate-13 10/09/2015  . Pneumococcal Polysaccharide-23 09/02/2014    Social History   Tobacco Use  . Smoking status: Former Smoker    Packs/day: 2.00    Years: 50.00    Pack years: 100.00    Quit date: 08/18/2014    Years since quitting: 3.9  . Smokeless tobacco: Former Network engineer Use Topics  . Alcohol use: No    Comment: quit 2003    Review of Systems  DATA OBTAINED: from nurse GENERAL:  no fevers, fatigue, appetite changes SKIN: No itching, rash HEENT: No complaint RESPIRATORY: No cough, wheezing, SOB CARDIAC: No chest pain, palpitations, lower extremity edema  GI: No abdominal pain, No N/V/D or constipation, No heartburn or reflux  GU: No dysuria, frequency or urgency, or incontinence  MUSCULOSKELETAL: No unrelieved bone/joint pain NEUROLOGIC: No headache, dizziness  PSYCHIATRIC: No overt anxiety or sadness  Vitals:   08/07/18 1210  BP: (!) 100/58  Pulse: 60  Resp: 18  Temp: 98.3 F (36.8 C)  SpO2: 93%   Body mass index is 39.93 kg/m. Physical Exam  GENERAL APPEARANCE: Alert, conversant, No acute distress  SKIN: No diaphoresis rash HEENT: Unremarkable RESPIRATORY: Breathing is even, unlabored. Lung sounds are clear   CARDIOVASCULAR: Heart RRR no murmurs, rubs or gallops. No peripheral edema  GASTROINTESTINAL: Abdomen is soft, non-tender, not distended w/ normal bowel sounds.  GENITOURINARY: Bladder non tender, not distended  MUSCULOSKELETAL: No abnormal joints or musculature NEUROLOGIC: Cranial nerves 2-12 grossly intact. Moves all extremities PSYCHIATRIC: Mood and affect appropriate to situation with dementia, no behavioral issues  Patient Active Problem List   Diagnosis Date Noted  . Low folate 08/09/2018  . Cutaneous abscess of back (any part, except buttock) 07/15/2018  . Pneumonia 07/10/2018  . Acute renal failure (ARF) ([place])  07/10/2018  . GERD without esophagitis 05/03/2018  . Dysphagia, oropharyngeal 05/03/2018  . Hypertensive heart and renal disease, stage 1-4 or unspecified chronic kidney disease, with heart failure ([place]) 04/25/2018  . Chronic respiratory failure with hypoxia ([place]) 04/25/2018  . Type 2 diabetes mellitus with stage 3 chronic kidney disease and hypertension ([place]) 04/25/2018  . Dyslipidemia associated with type 2 diabetes mellitus ([place]) 04/25/2018  . Chronic constipation 04/25/2018  . Essential tremor 04/25/2018  . CKD stage 3 due to type 2 diabetes mellitus ([place]) 04/25/2018  . Peripheral vascular disease ([place]) 02/04/2017  . Controlled diabetes mellitus type 2 with complications ([place]) 93/71/6967  . Aspiration pneumonia ([place]) 09/30/2015  . Parkinsonism ([place]) 09/30/2015  . Tremor 08/11/2015  . Suicidal ideation 05/10/2015  . Bipolar depression ([place]) 05/10/2015  . Complete heart block ([place]) 03/20/2015  . Bradycardia 03/14/2015  . Dementia with behavioral disturbance ([place]) 02/15/2015  . Staphylococcus aureus bacteremia with sepsis ([place]) 01/28/2015  . Infection of pacemaker pocket ([place]) 01/28/2015  . Acute respiratory failure ([place]) 01/26/2015  . AKI (acute kidney injury) ([place]) 01/26/2015  . Acute encephalopathy 01/26/2015  . Altered mental status   . Vitamin D deficiency 01/07/2015  . Mobitz type 2 second degree atrioventricular block 12/12/2014  . Pacemaker 12/12/2014  . Symptomatic bradycardia 12/03/2014  . Lower back pain 06/12/2012  . Right hip pain 06/12/2012  . Intertrigo 05/09/2012  . Insomnia 04/19/2012  . General weakness 03/07/2012  . 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 Diarrhea 01/17/2012  . Pyelonephritis 01/15/2012  . Diabetic peripheral neuropathy ([place]) 09/28/2011  . Urinary incontinence 09/28/2011  . Preventative health care 09/23/2011  .  Gout 09/23/2011  . Bipolar affective disorder ([place]) 09/23/2011  . TIA (transient ischemic attack) 09/23/2011   . Chronic pain 09/23/2011  . DNR (do not resuscitate) 09/23/2011  . Cerebral infarction ([place]) 09/23/2011  . 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 History of alcohol abuse 09/23/2011  . Benign prostatic hyperplasia   . Chronic systolic CHF (congestive heart failure) ([place]) 05/05/2011  . Edema 01/03/2011  . CAD (coronary artery disease) 05/18/2010  . Hyperlipidemia 05/18/2010  . Other abnormality of urination(788.69) 11/25/2009  . Other specified forms of chronic ischemic heart disease 05/19/2009  . Coronary artery disease involving coronary bypass graft of native heart with angina pectoris ([place]) 11/18/2008  . AMI, INFERIOR WALL 09/30/2008  . Obstructive sleep apnea 09/16/2008  . SINUS TACHYCARDIA 09/02/2008  . DM (diabetes mellitus), type 2 with peripheral vascular complications ([place]) 41/28/7867  . OBESITY 09/01/2008  . Hypertensive heart disease with heart failure ([place]) 09/01/2008  . TOBACCO ABUSE 07/31/2007  . 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    CMP     Component Value Date/Time   NA 144 01/23/2018   K 4.5 01/23/2018   CL 102 03/20/2015 1038   CO2 28 03/20/2015 1038   GLUCOSE 171 (H) 03/20/2015 1038   BUN 65 (A) 01/23/2018   CREATININE 2.7 (A) 01/23/2018   CREATININE 1.23 03/20/2015 1038   CREATININE 1.06 12/05/2014 1536   CALCIUM 9.6 03/20/2015 1038   PROT 5.8 (L) 02/01/2015 0245   ALBUMIN 2.2 (L) 02/01/2015 0245   AST 17 02/11/2017   ALT 6 (A) 02/11/2017   ALKPHOS 21 (A) 02/11/2017   BILITOT 0.4 02/01/2015 0245   GFRNONAA 55 (L) 03/20/2015 1038   GFRAA >60 03/20/2015 1038   Recent Labs    01/23/18  NA 144  K 4.5  BUN 65*  CREATININE 2.7*   No results for input(s): AST, ALT, ALKPHOS, BILITOT, PROT, ALBUMIN in the last 8760 hours. No results for input(s): WBC, NEUTROABS, HGB, HCT, MCV, PLT in the last 8760 hours. Recent Labs    01/23/18  CHOL 165  LDLCALC 63  TRIG 311*   Lab Results  Component Value Date   MICROALBUR >42.6 04/09/2016   Lab Results   Component Value Date   TSH 1.47 01/23/2018   Lab Results  Component Value Date   HGBA1C 7.6 01/23/2018   Lab Results  Component Value Date   CHOL 165 01/23/2018   HDL 39 01/23/2018   LDLCALC 63 01/23/2018   LDLDIRECT 85.2 06/13/2012   TRIG 311 (A) 01/23/2018   CHOLHDL 7 06/13/2012    Significant Diagnostic Results in last 30 days:  No results found.  Assessment and Plan  Low folate Patient's folate is less than 1, which is not acceptable; patient started on folate 1 mg daily, patient already on iron; will repeat CBC in about 3 months     Victor Duos, [name]

## 2018-08-09 ENCOUNTER — Encounter: Payer: Self-pay | Admitting: Internal Medicine

## 2018-08-09 DIAGNOSIS — E538 Deficiency of other specified B group vitamins: Secondary | ICD-10-CM | POA: Insufficient documentation

## 2018-08-09 NOTE — Assessment & Plan Note (Signed)
Patient's folate is less than 1, which is not acceptable; patient started on folate 1 mg daily, patient already on iron; will repeat CBC in about 3 months

## 2018-08-10 ENCOUNTER — Non-Acute Institutional Stay (SKILLED_NURSING_FACILITY): Payer: Medicare Other | Admitting: Internal Medicine

## 2018-08-10 ENCOUNTER — Encounter: Payer: Self-pay | Admitting: Internal Medicine

## 2018-08-10 DIAGNOSIS — E162 Hypoglycemia, unspecified: Secondary | ICD-10-CM

## 2018-08-10 DIAGNOSIS — N19 Unspecified kidney failure: Secondary | ICD-10-CM

## 2018-08-10 LAB — BASIC METABOLIC PANEL
BUN: 87 — AB (ref 4–21)
Creatinine: 2.3 — AB (ref 0.6–1.3)
Glucose: 42
Potassium: 5.2 (ref 3.4–5.3)
Sodium: 143 (ref 137–147)

## 2018-08-10 NOTE — Progress Notes (Signed)
Location:  [facility] Room Number: 308-D Place of Service:  SNF (31)  Victor Duos, MD  Patient Care Team: Victor Duos, MD as PCP - General (Internal Medicine) [name], MD as Consulting Physician (Cardiology) [name], MD as Consulting Physician (Neurology)  Extended Emergency Contact Information Primary Emergency Contact: [name] Address: [place]          [name] [address] [name] Phone: [phone] Mobile Phone: [phone] Relation: Spouse Secondary Emergency Contact: [name] of [place] Phone: [phone] Mobile Phone: [phone] Relation: Daughter    Allergies: Benzedrine [amphetamine], Dexedrine [dextroamphetamine sulfate er], Oxycodone, and Vancomycin  Chief Complaint  Patient presents with  . Acute Visit    Patient is seen for abnormal labs.    HPI: Patient is a 78 y.o. male who is being seen because a routine BMP returned with a BUN of 86.8 and a creatinine of 2.25 as well as a glucose of 42.  Patient denies any symptoms patient refuses IV fluids.  Past Medical History:  Diagnosis Date  . 2nd degree AV block    a. s/p STJ dual chamber pacemaker 11/2014  . Acute respiratory failure ([place]) 01/26/2015  . AKI (acute kidney injury) ([place]) 01/26/2015  . AMI, INFERIOR WALL 09/30/2008   Qualifier: Diagnosis of  By: Unk Lightning, RN, BSN, Melanie    . Benign neoplasm of colon     Adenomatous polyps  . Benign prostatic hyperplasia   . Bipolar affective disorder ([place])    . Bipolar depression ([place]) 05/10/2015  . BPH (benign prostatic hypertrophy)   . CAD (coronary artery disease)    a. s/p CABG  . CAD, ARTERY BYPASS GRAFT 11/18/2008   Qualifier: Diagnosis of  By: Angelena Form, MD, Harrell Gave    . Chronic pain    . CVA (cerebral infarction)     Small left occipital  . Dementia with behavioral disturbance ([place]) 02/15/2015  .  Depression    . Diabetic peripheral neuropathy ([place]) 09/28/2011  . DM (diabetes mellitus), type 2 with peripheral vascular complications ([place]) 8/93/8101   Qualifier: Diagnosis of  By: Burnett Kanaris    . Gout    . History of alcohol abuse    . Hyperlipidemia    . HYPERTENSION        . Hypertensive heart disease with heart failure ([place]) 09/01/2008   Qualifier: Diagnosis of  By: Burnett Kanaris    . Insomnia 04/19/2012  . Ischemic cardiomyopathy    . Narcolepsy    . Obesity   . OBESITY 09/01/2008   Qualifier: Diagnosis of  By: Burnett Kanaris    . OBSTRUCTIVE SLEEP APNEA        . Obstructive sleep apnea 09/16/2008   Qualifier: Diagnosis of  By: [name] MD, [name]   . Pacemaker 12/12/2014  . Parkinsonism ([place]) 09/30/2015  . [facility] spotted fever   . SINUS TACHYCARDIA 09/02/2008   Qualifier: Diagnosis of  By: Caryl Comes, MD, [name]   . TIA (transient ischemic attack)    . TOBACCO ABUSE 07/31/2007   Qualifier: Diagnosis of  By: [name] MD, [name]   . Tremor 08/11/2015  . UNIVERSAL ULCERATIVE COLITIS        . Vitamin D deficiency 01/07/2015    Past Surgical History:  Procedure Laterality Date  . CARDIAC CATHETERIZATION     Triple-vessel coronary artery disease. Low-normal left  ventricular systolic function with mild   anteroapical wall motion abnormality.   [name] Kitchen CARDIAC CATHETERIZATION N/A 01/28/2015   Procedure: Temporary Pacemaker;  Surgeon: [name], MD;  Location: [facility] CV LAB;  Service: Cardiovascular;  Laterality: N/A;  . CORONARY ARTERY BYPASS GRAFT     Median sternotomy, extracorporeal circulation,  coronary artery bypass graft surgery x4 using a sequential left internal   mammary artery graft to the mid and distal left anterior descending, a   saphenous vein graft to diagonal branch of the left anterior descending,   and a saphenous vein graft to the obtuse marginal branch of left   circumflex coronary artery.    . EP IMPLANTABLE DEVICE N/A  12/12/2014   Procedure: Pacemaker Implant;  Surgeon: [name], MD;  Location: [facility] [facility] LAB;  Service: Cardiovascular;  Laterality: N/A;  . EP IMPLANTABLE DEVICE N/A 01/28/2015   Procedure: PPM Generator Removal;  Surgeon: [name], MD;  Location: [facility];  Service: Cardiovascular;  Laterality: N/A;  . EP IMPLANTABLE DEVICE N/A 03/20/2015   Procedure: Pacemaker Implant;  Surgeon: [name], MD;  Location: [facility];  Service: Cardiovascular;  Laterality: N/A;  . FLEXIBLE SIGMOIDOSCOPY  01/17/2012   Procedure: FLEXIBLE SIGMOIDOSCOPY;  Surgeon: Lafayette Dragon, MD;  Location: [facility];  Service: Endoscopy;  Laterality: N/A;  . MULTIPLE EXTRACTIONS WITH ALVEOLOPLASTY  12/01/2011   Procedure: MULTIPLE EXTRACION WITH ALVEOLOPLASTY;  Surgeon: Lenn Cal, DDS;  Location: [facility];  Service: Oral Surgery;  Laterality: N/A;  Extraction of tooth #'s 3,4,5,6,7,8,9,10,11,12,13,14,15,16,17,20,21,22,23,24,25,26,27,28,29,30,31,32 with alveoloplasty and bilateral mandibular lingual tori  . TONSILECTOMY, ADENOIDECTOMY, BILATERAL MYRINGOTOMY AND TUBES  1948    Allergies as of 08/10/2018      Reactions   Benzedrine [amphetamine] Nausea Only   Dexedrine [dextroamphetamine Sulfate Er] Other (See Comments)   agitation   Oxycodone Nausea And Vomiting   Vancomycin    Unknown - per [facility]      Medication List       Accurate as of August 10, 2018  4:03 PM. If you have any questions, ask your nurse or doctor.        Afrin Nasal Spray 0.05 % nasal spray Generic drug: oxymetazoline Place 1 spray into both nostrils as needed (Nose bleed). Spray cotton ball and insert into nose as needed for nose bleed.   aspirin 81 MG chewable tablet Chew 81 mg by mouth at bedtime.   carbidopa-levodopa 25-100 MG tablet Commonly known as: SINEMET IR Take 2 tablets by mouth 3 (three) times daily with meals.   carvedilol 3.125 MG tablet Commonly known as: COREG Take 3.125 mg by mouth  2 (two) times daily with a meal. Hold for SBP less than 105   clonazePAM 0.5 MG tablet Commonly known as: KLONOPIN Take 1 tablet (0.5 mg total) by mouth at bedtime.   docusate sodium 100 MG capsule Commonly known as: COLACE Take 100 mg by mouth daily as needed for mild constipation.   donepezil 10 MG tablet Commonly known as: ARICEPT Take 10 mg by mouth at bedtime.   doxazosin 1 MG tablet Commonly known as: CARDURA Take 1 mg by mouth at bedtime.   fenofibrate 145 MG tablet Commonly known as: TRICOR Take 145 mg by mouth daily. With a meal   ferrous sulfate 325 (65 FE) MG tablet Take 325 mg by mouth 2 (two) times daily with a meal.   Fish Oil 1000 MG Caps Take 1 capsule by mouth  3 (three) times daily.   folic acid 1 MG tablet Commonly known as: FOLVITE Take 1 mg by mouth daily.   furosemide 40 MG tablet Commonly known as: LASIX Take 40 mg by mouth daily.   gabapentin 400 MG capsule Commonly known as: NEURONTIN Take 400 mg by mouth 3 (three) times daily.   HumaLOG KwikPen 100 UNIT/ML KiwkPen Generic drug: insulin lispro Inject 20 Units into the skin 3 (three) times daily. Hold for CBG  < 200. ( to be given in addition to sliding scale) Check FSBS before meals and at bedtime. SSI 201-250 = 4 units, 251-300 = 6 units, 301-350 = 8 units, 351-400 = 10 units   ipratropium-albuterol 0.5-2.5 (3) MG/3ML Soln Commonly known as: DUONEB Take 3 mLs by nebulization every 4 (four) hours as needed (SOB/congestion).   lactose free nutrition Liqd Take 237 mLs by mouth 2 (two) times daily between meals. Prefers chocolate   lamoTRIgine 25 MG tablet Commonly known as: LAMICTAL Take 25 mg by mouth daily. give one tab with Lamictal 100 mg to equal 125 mg daily   lamoTRIgine 100 MG tablet Commonly known as: LAMICTAL Take 100 mg by mouth daily. Give 100 mg tablet along with 25 mg tablet to equal 125 mg total   Lexapro 5 MG tablet Generic drug: escitalopram Take 5 mg by mouth daily.    losartan 50 MG tablet Commonly known as: COZAAR Take 50 mg by mouth every morning.   Magnesium Oxide 500 MG Tabs Take 1 tablet by mouth daily.   metFORMIN 1000 MG tablet Commonly known as: GLUCOPHAGE Take 1,000 mg by mouth 2 (two) times daily.   multivitamin tablet Take 1 tablet by mouth daily.   nitroGLYCERIN 0.4 MG SL tablet Commonly known as: NITROSTAT Place 0.4 mg under the tongue every 5 (five) minutes as needed for chest pain. x3 doses PRN, notify MD if no relief   omeprazole 20 MG tablet Commonly known as: PRILOSEC OTC Take 20 mg by mouth daily.   OXYGEN Inhale 2 L into the lungs continuous. To keep spo2 above 90%   polyethylene glycol 17 g packet Commonly known as: MIRALAX / GLYCOLAX Take 17 g by mouth daily.   pravastatin 40 MG tablet Commonly known as: PRAVACHOL Take 40 mg by mouth at bedtime.   primidone 50 MG tablet Commonly known as: MYSOLINE Take 25 mg by mouth at bedtime. 0.5 tablet to = 25 mg   promethazine 25 MG tablet Commonly known as: PHENERGAN Take 25 mg by mouth every 6 (six) hours as needed for nausea or vomiting. x3 doses. Notify MD if symptoms persist for more than 24 hours   Skin Prep Wipes Misc Apply to bilateral heels and protective foam dressing every 3 days   Toujeo Max SoloStar 300 UNIT/ML Sopn Generic drug: Insulin Glargine Inject 50 Units into the skin at bedtime. (PRIME PEN WITH 3 UNITS PRIOR TO EACH USE - DO NOT MIX WITH ANY OTHER INSULIN - USE WITHIN 42 DAYS AFTER OPENING   traMADol 50 MG tablet Commonly known as: ULTRAM Take 50 mg by mouth every 8 (eight) hours. Give each shift   traZODone 50 MG tablet Commonly known as: DESYREL Take 1 tablet (50 mg total) by mouth at bedtime.   vitamin C 500 MG tablet Commonly known as: ASCORBIC ACID Take 1,000 mg by mouth daily. 2 tablets to = 1000 mg   Vitamin D3 1.25 MG (50000 UT) Caps Take 1 capsule by mouth once a week.  No orders of the defined types were placed in  this encounter.   Immunization History  Administered Date(s) Administered  . Influenza-Unspecified 10/28/2016, 10/07/2017  . PPD Test 03/20/2013  . Pneumococcal Conjugate-13 10/09/2015  . Pneumococcal Polysaccharide-23 09/02/2014    Social History   Tobacco Use  . Smoking status: Former Smoker    Packs/day: 2.00    Years: 50.00    Pack years: 100.00    Quit date: 08/18/2014    Years since quitting: 3.9  . Smokeless tobacco: Former Network engineer Use Topics  . Alcohol use: No    Comment: quit 2003    Review of Systems  DATA OBTAINED: from patient GENERAL:  no fevers, fatigue, appetite changes SKIN: No itching, rash HEENT: No complaint RESPIRATORY: No cough, wheezing, SOB CARDIAC: No chest pain, palpitations, lower extremity edema  GI: No abdominal pain, No N/V/D or constipation, No heartburn or reflux  GU: No dysuria, frequency or urgency, or incontinence  MUSCULOSKELETAL: No unrelieved bone/joint pain NEUROLOGIC: No headache, dizziness  PSYCHIATRIC: No overt anxiety or sadness  Vitals:   08/10/18 1546  BP: 122/71  Pulse: 74  Resp: 18  Temp: 97.7 F (36.5 C)  SpO2: 93%   Body mass index is 39.48 kg/m. Physical Exam  GENERAL APPEARANCE: Alert, conversant, No acute distress  SKIN: No diaphoresis rash HEENT: Unremarkable RESPIRATORY: Breathing is even, unlabored. Lung sounds are clear   CARDIOVASCULAR: Heart RRR no murmurs, rubs or gallops. No peripheral edema  GASTROINTESTINAL: Abdomen is soft, non-tender, not distended w/ normal bowel sounds.  GENITOURINARY: Bladder non tender, not distended  MUSCULOSKELETAL: No abnormal joints or musculature NEUROLOGIC: Cranial nerves 2-12 grossly intact. Moves all extremities PSYCHIATRIC: Mood and affect appropriate to situation with dementia, no behavioral issues  Patient Active Problem List   Diagnosis Date Noted  . Low folate 08/09/2018  . Cutaneous abscess of back (any part, except buttock) 07/15/2018  .  Pneumonia 07/10/2018  . Acute renal failure (ARF) ([place]) 07/10/2018  . GERD without esophagitis 05/03/2018  . Dysphagia, oropharyngeal 05/03/2018  . Hypertensive heart and renal disease, stage 1-4 or unspecified chronic kidney disease, with heart failure ([place]) 04/25/2018  . Chronic respiratory failure with hypoxia ([place]) 04/25/2018  . Type 2 diabetes mellitus with stage 3 chronic kidney disease and hypertension ([place]) 04/25/2018  . Dyslipidemia associated with type 2 diabetes mellitus ([place]) 04/25/2018  . Chronic constipation 04/25/2018  . Essential tremor 04/25/2018  . CKD stage 3 due to type 2 diabetes mellitus ([place]) 04/25/2018  . Peripheral vascular disease ([place]) 02/04/2017  . Controlled diabetes mellitus type 2 with complications ([place]) 67/34[birth year]1937  . Aspiration pneumonia ([place]) 09/30/2015  . Parkinsonism ([place]) 09/30/2015  . Tremor 08/11/2015  . Suicidal ideation 05/10/2015  . Bipolar depression ([place]) 05/10/2015  . Complete heart block ([place]) 03/20/2015  . Bradycardia 03/14/2015  . Dementia with behavioral disturbance ([place]) 02/15/2015  . Staphylococcus aureus bacteremia with sepsis ([place]) 01/28/2015  . Infection of pacemaker pocket ([place]) 01/28/2015  . Acute respiratory failure ([place]) 01/26/2015  . AKI (acute kidney injury) ([place]) 01/26/2015  . Acute encephalopathy 01/26/2015  . Altered mental status   . Vitamin D deficiency 01/07/2015  . Mobitz type 2 second degree atrioventricular block 12/12/2014  . Pacemaker 12/12/2014  . Symptomatic bradycardia 12/03/2014  . Lower back pain 06/12/2012  . Right hip pain 06/12/2012  . Intertrigo 05/09/2012  . Insomnia 04/19/2012  . General weakness 03/07/2012  . 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 Diarrhea 01/17/2012  . Pyelonephritis 01/15/2012  .  Diabetic peripheral neuropathy ([place]) 09/28/2011  . Urinary incontinence 09/28/2011  . Preventative health care 09/23/2011  . Gout 09/23/2011  . Bipolar affective disorder ([place])  09/23/2011  . TIA (transient ischemic attack) 09/23/2011  . Chronic pain 09/23/2011  . DNR (do not resuscitate) 09/23/2011  . Cerebral infarction ([place]) 09/23/2011  . 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 History of alcohol abuse 09/23/2011  . Benign prostatic hyperplasia   . Chronic systolic CHF (congestive heart failure) ([place]) 05/05/2011  . Edema 01/03/2011  . CAD (coronary artery disease) 05/18/2010  . Hyperlipidemia 05/18/2010  . Other abnormality of urination(788.69) 11/25/2009  . Other specified forms of chronic ischemic heart disease 05/19/2009  . Coronary artery disease involving coronary bypass graft of native heart with angina pectoris ([place]) 11/18/2008  . AMI, INFERIOR WALL 09/30/2008  . Obstructive sleep apnea 09/16/2008  . SINUS TACHYCARDIA 09/02/2008  . DM (diabetes mellitus), type 2 with peripheral vascular complications ([place]) 53/66/4403  . OBESITY 09/01/2008  . Hypertensive heart disease with heart failure ([place]) 09/01/2008  . TOBACCO ABUSE 07/31/2007  . 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    CMP     Component Value Date/Time   NA 144 01/23/2018   K 4.5 01/23/2018   CL 102 03/20/2015 1038   CO2 28 03/20/2015 1038   GLUCOSE 171 (H) 03/20/2015 1038   BUN 65 (A) 01/23/2018   CREATININE 2.7 (A) 01/23/2018   CREATININE 1.23 03/20/2015 1038   CREATININE 1.06 12/05/2014 1536   CALCIUM 9.6 03/20/2015 1038   PROT 5.8 (L) 02/01/2015 0245   ALBUMIN 2.2 (L) 02/01/2015 0245   AST 17 02/11/2017   ALT 6 (A) 02/11/2017   ALKPHOS 21 (A) 02/11/2017   BILITOT 0.4 02/01/2015 0245   GFRNONAA 55 (L) 03/20/2015 1038   GFRAA >60 03/20/2015 1038   Recent Labs    01/23/18  NA 144  K 4.5  BUN 65*  CREATININE 2.7*   No results for input(s): AST, ALT, ALKPHOS, BILITOT, PROT, ALBUMIN in the last 8760 hours. No results for input(s): WBC, NEUTROABS, HGB, HCT, MCV, PLT in the last 8760 hours. Recent Labs    01/23/18  CHOL 165  LDLCALC 63  TRIG 311*   Lab Results  Component  Value Date   MICROALBUR >42.6 04/09/2016   Lab Results  Component Value Date   TSH 1.47 01/23/2018   Lab Results  Component Value Date   HGBA1C 7.6 01/23/2018   Lab Results  Component Value Date   CHOL 165 01/23/2018   HDL 39 01/23/2018   LDLCALC 63 01/23/2018   LDLDIRECT 85.2 06/13/2012   TRIG 311 (A) 01/23/2018   CHOLHDL 7 06/13/2012    Significant Diagnostic Results in last 30 days:  No results found.  Assessment and Plan  Uremia/hypoglycemia- patient sodium 143, potassium 5.2, chloride 104, CO2 27, BUN 86.8, creatinine 2.25, and glucose 42.  I told patient he need to increase his fluid, and that he needed IV fluid.  I told him he had an abnormal lab, and he told me that was my problem.  I told him not really, it was going to be his kidneys problem because his kidney need to see more fluid than it was seeing.  I told him if he kept this up that he would die or lose his kidney or both patient still refused IV fluid but agreed to drink 120 cc of water every 2 hours x8 times a day, and we left him of that  Hypoglycemia-patient glucose was 42 on his morning  BMP.  Quantities had are reduced his Toujeo from 60 units in the evening to 50 units in the evening; I am reducing her to 35 units in the evening and we will of course continue to monitor sugars.  Patient's last A1c is 7.1, which is beyond excellent for him.  We also expect the reason for this is that there have been no family members visiting him therefore no family members bring in foods that he should not eat.      Victor Duos, MD

## 2018-08-11 ENCOUNTER — Encounter: Payer: Self-pay | Admitting: Internal Medicine

## 2018-08-12 LAB — CUP PACEART REMOTE DEVICE CHECK
Date Time Interrogation Session: 20200726110305
Implantable Lead Implant Date: 20170303
Implantable Lead Implant Date: 20170303
Implantable Lead Location: 753859
Implantable Lead Location: 753860
Implantable Pulse Generator Implant Date: 20170303
Pulse Gen Model: 2240
Pulse Gen Serial Number: 7888922

## 2018-08-13 ENCOUNTER — Ambulatory Visit (INDEPENDENT_AMBULATORY_CARE_PROVIDER_SITE_OTHER): Payer: Medicare Other | Admitting: *Deleted

## 2018-08-13 DIAGNOSIS — I5022 Chronic systolic (congestive) heart failure: Secondary | ICD-10-CM

## 2018-08-13 LAB — BASIC METABOLIC PANEL
BUN: 64 — AB (ref 4–21)
Creatinine: 2.3 — AB (ref 0.6–1.3)
Glucose: 92
Potassium: 4.8 (ref 3.4–5.3)
Sodium: 140 (ref 137–147)

## 2018-08-15 LAB — BASIC METABOLIC PANEL
BUN: 48 — AB (ref 4–21)
Creatinine: 1.7 — AB (ref 0.6–1.3)
Glucose: 84
Potassium: 4.6 (ref 3.4–5.3)
Sodium: 141 (ref 137–147)

## 2018-08-21 ENCOUNTER — Encounter: Payer: Self-pay | Admitting: Internal Medicine

## 2018-08-21 ENCOUNTER — Non-Acute Institutional Stay (SKILLED_NURSING_FACILITY): Payer: Medicare Other | Admitting: Internal Medicine

## 2018-08-21 DIAGNOSIS — J9611 Chronic respiratory failure with hypoxia: Secondary | ICD-10-CM

## 2018-08-21 DIAGNOSIS — E1169 Type 2 diabetes mellitus with other specified complication: Secondary | ICD-10-CM | POA: Diagnosis not present

## 2018-08-21 DIAGNOSIS — I441 Atrioventricular block, second degree: Secondary | ICD-10-CM

## 2018-08-21 DIAGNOSIS — E785 Hyperlipidemia, unspecified: Secondary | ICD-10-CM

## 2018-08-21 NOTE — Progress Notes (Signed)
Location:  [facility] Room Number: 308-D Place of Service:  SNF (31)  [name], MD  Patient Care Team: [name], MD as PCP - General (Internal Medicine) [name], MD as Consulting Physician (Cardiology) [name], MD as Consulting Physician (Neurology)  Extended Emergency Contact Information Primary Emergency Contact: [name],[name] Address: [address]          [place], [place] [address] [name] of [place] Phone: [phone] Mobile Phone: [phone] Relation: Spouse Secondary Emergency Contact: [name], Raemon [name] of [place] Phone: [phone] Mobile Phone: [phone] Relation: Daughter    Allergies: Benzedrine [amphetamine], Dexedrine [dextroamphetamine sulfate er], Oxycodone, and Vancomycin  Chief Complaint  Patient presents with  . Medical Management of Chronic Issues    Routine [facility] visit    HPI: Patient is a 78 y.o. male who is being seen for routine issues of Mobitz 2 heart block with pacemaker, chronic respiratory failure with hypoxia, and hyperlipidemia associated with diabetes.  Past Medical History:  Diagnosis Date  . 2nd degree AV block    a. s/p STJ dual chamber pacemaker 11/2014  . Acute respiratory failure ([place]) 01/26/2015  . AKI (acute kidney injury) ([place]) 01/26/2015  . AMI, INFERIOR WALL 09/30/2008   Qualifier: Diagnosis of  By: Unk Lightning, RN, BSN, Melanie    . Benign neoplasm of colon     Adenomatous polyps  . Benign prostatic hyperplasia   . Bipolar affective disorder ([place])    . Bipolar depression ([place]) 05/10/2015  . BPH (benign prostatic hypertrophy)   . CAD (coronary artery disease)    a. s/p CABG  . CAD, ARTERY BYPASS GRAFT 11/18/2008   Qualifier: Diagnosis of  By: Angelena Form, MD, Harrell Gave    . Chronic pain    . CVA (cerebral infarction)     Small left occipital  . Dementia with behavioral disturbance ([place]) 02/15/2015   . Depression    . Diabetic peripheral neuropathy ([place]) 09/28/2011  . DM (diabetes mellitus), type 2 with peripheral vascular complications ([address] [place]) 6/44/0347   Qualifier: Diagnosis of  By: Burnett Kanaris    . Gout    . History of alcohol abuse    . Hyperlipidemia    . HYPERTENSION        . Hypertensive heart disease with heart failure ([place]) 09/01/2008   Qualifier: Diagnosis of  By: Burnett Kanaris    . Insomnia 04/19/2012  . Ischemic cardiomyopathy    . Narcolepsy    . Obesity   . OBESITY 09/01/2008   Qualifier: Diagnosis of  By: Burnett Kanaris    . OBSTRUCTIVE SLEEP APNEA        . Obstructive sleep apnea 09/16/2008   Qualifier: Diagnosis of  By: [name] MD, [name]   . Pacemaker 12/12/2014  . Parkinsonism ([place]) 09/30/2015  . [facility] spotted fever   . SINUS TACHYCARDIA 09/02/2008   Qualifier: Diagnosis of  By: Caryl Comes, MD, [name]   . TIA (transient ischemic attack)    . TOBACCO ABUSE 07/31/2007   Qualifier: Diagnosis of  By: [name] MD, [name]   . Tremor 08/11/2015  . UNIVERSAL ULCERATIVE COLITIS        . Vitamin D deficiency 01/07/2015    Past Surgical History:  Procedure Laterality Date  . CARDIAC CATHETERIZATION     Triple-vessel coronary artery disease. Low-normal left ventricular systolic function with mild   anteroapical wall  motion abnormality.   [name] Kitchen CARDIAC CATHETERIZATION N/A 01/28/2015   Procedure: Temporary Pacemaker;  Surgeon: [name], MD;  Location: [facility];  Service: Cardiovascular;  Laterality: N/A;  . CORONARY ARTERY BYPASS GRAFT     Median sternotomy, extracorporeal circulation,  coronary artery bypass graft surgery x4 using a sequential left internal   mammary artery graft to the mid and distal left anterior descending, a   saphenous vein graft to diagonal branch of the left anterior descending,   and a saphenous vein graft to the obtuse marginal branch of left   circumflex coronary artery.    . EP IMPLANTABLE DEVICE N/A  12/12/2014   Procedure: Pacemaker Implant;  Surgeon: [name], MD;  Location: [facility];  Service: Cardiovascular;  Laterality: N/A;  . EP IMPLANTABLE DEVICE N/A 01/28/2015   Procedure: PPM Generator Removal;  Surgeon: [name], MD;  Location: [facility];  Service: Cardiovascular;  Laterality: N/A;  . EP IMPLANTABLE DEVICE N/A 03/20/2015   Procedure: Pacemaker Implant;  Surgeon: [name], MD;  Location: [facility];  Service: Cardiovascular;  Laterality: N/A;  . FLEXIBLE SIGMOIDOSCOPY  01/17/2012   Procedure: FLEXIBLE SIGMOIDOSCOPY;  Surgeon: Lafayette Dragon, MD;  Location: [facility];  Service: Endoscopy;  Laterality: N/A;  . MULTIPLE EXTRACTIONS WITH ALVEOLOPLASTY  12/01/2011   Procedure: MULTIPLE EXTRACION WITH ALVEOLOPLASTY;  Surgeon: Lenn Cal, DDS;  Location: [facility];  Service: Oral Surgery;  Laterality: N/A;  Extraction of tooth #'s 3,4,5,6,7,8,9,10,11,12,13,14,15,16,17,20,21,22,23,24,25,26,27,28,29,30,31,32 with alveoloplasty and bilateral mandibular lingual tori  . TONSILECTOMY, ADENOIDECTOMY, BILATERAL MYRINGOTOMY AND TUBES  1948    Allergies as of 08/21/2018      Reactions   Benzedrine [amphetamine] Nausea Only   Dexedrine [dextroamphetamine Sulfate Er] Other (See Comments)   agitation   Oxycodone Nausea And Vomiting   Vancomycin    Unknown - per [facility]      Medication List       Accurate as of August 21, 2018 11:59 PM. If you have any questions, ask your nurse or doctor.        STOP taking these medications   Afrin Nasal Spray 0.05 % nasal spray Generic drug: oxymetazoline Stopped by: Inocencio Homes, MD     TAKE these medications   aspirin 81 MG chewable tablet Chew 81 mg by mouth at bedtime.   carbidopa-levodopa 25-100 MG tablet Commonly known as: SINEMET IR Take 2 tablets by mouth 3 (three) times daily with meals.   carvedilol 3.125 MG tablet Commonly known as: COREG Take 3.125 mg by mouth 2 (two) times daily with a  meal. Hold for SBP less than 105   clonazePAM 0.5 MG tablet Commonly known as: KLONOPIN Take 1 tablet (0.5 mg total) by mouth at bedtime.   docusate sodium 100 MG capsule Commonly known as: COLACE Take 100 mg by mouth daily as needed for mild constipation.   donepezil 10 MG tablet Commonly known as: ARICEPT Take 10 mg by mouth at bedtime.   doxazosin 1 MG tablet Commonly known as: CARDURA Take 1 mg by mouth at bedtime.   fenofibrate 145 MG tablet Commonly known as: TRICOR Take 145 mg by mouth daily. With a meal   ferrous sulfate 325 (65 FE) MG tablet Take 325 mg by mouth 2 (two) times daily with a meal. x3 months, ending 10/31/18   Fish Oil 1000 MG Caps Take 1 capsule by mouth 3 (three) times daily.   folic acid 1 MG  tablet Commonly known as: FOLVITE Take 1 mg by mouth daily.   furosemide 40 MG tablet Commonly known as: LASIX Take 40 mg by mouth daily.   gabapentin 400 MG capsule Commonly known as: NEURONTIN Take 400 mg by mouth 3 (three) times daily.   HumaLOG KwikPen 100 UNIT/ML KiwkPen Generic drug: insulin lispro Inject 20 Units into the skin 3 (three) times daily. Hold for CBG  < 200. ( to be given in addition to sliding scale) Check FSBS before meals and at bedtime. SSI 201-250 = 4 units, 251-300 = 6 units, 301-350 = 8 units, 351-400 = 10 units   ipratropium-albuterol 0.5-2.5 (3) MG/3ML Soln Commonly known as: DUONEB Take 3 mLs by nebulization every 4 (four) hours as needed (SOB/congestion).   lactose free nutrition Liqd Take 237 mLs by mouth 2 (two) times daily between meals. Prefers chocolate   lamoTRIgine 25 MG tablet Commonly known as: LAMICTAL Take 25 mg by mouth daily. give one tab with Lamictal 100 mg to equal 125 mg daily   lamoTRIgine 100 MG tablet Commonly known as: LAMICTAL Take 100 mg by mouth daily. Give 100 mg tablet along with 25 mg tablet to equal 125 mg total   Lexapro 5 MG tablet Generic drug: escitalopram Take 5 mg by mouth daily.    losartan 50 MG tablet Commonly known as: COZAAR Take 50 mg by mouth every morning.   Magnesium Oxide 500 MG Tabs Take 1 tablet by mouth daily.   metFORMIN 1000 MG tablet Commonly known as: GLUCOPHAGE Take 1,000 mg by mouth 2 (two) times daily.   multivitamin tablet Take 1 tablet by mouth daily.   nitroGLYCERIN 0.4 MG SL tablet Commonly known as: NITROSTAT Place 0.4 mg under the tongue every 5 (five) minutes as needed for chest pain. x3 doses PRN, notify MD if no relief   omeprazole 20 MG tablet Commonly known as: PRILOSEC OTC Take 20 mg by mouth daily.   OXYGEN Inhale 2 L into the lungs continuous. To keep spo2 above 90%   polyethylene glycol 17 g packet Commonly known as: MIRALAX / GLYCOLAX Take 17 g by mouth daily.   pravastatin 40 MG tablet Commonly known as: PRAVACHOL Take 40 mg by mouth at bedtime.   primidone 50 MG tablet Commonly known as: MYSOLINE Take 25 mg by mouth at bedtime. 0.5 tablet to = 25 mg   promethazine 25 MG tablet Commonly known as: PHENERGAN Take 25 mg by mouth every 6 (six) hours as needed for nausea or vomiting. x3 doses. Notify MD if symptoms persist for more than 24 hours   Skin Prep Wipes Misc Apply to bilateral heels and protective foam dressing every 3 days   Toujeo Max SoloStar 300 UNIT/ML Sopn Generic drug: Insulin Glargine Inject 35 Units into the skin at bedtime.   traMADol 50 MG tablet Commonly known as: ULTRAM Take 50 mg by mouth every 8 (eight) hours. Give each shift   traZODone 50 MG tablet Commonly known as: DESYREL Take 1 tablet (50 mg total) by mouth at bedtime.   vitamin C 500 MG tablet Commonly known as: ASCORBIC ACID Take 1,000 mg by mouth daily. 2 tablets to = 1000 mg x3 months, ending 10/31/18   Vitamin D3 1.25 MG (50000 UT) Caps Take 1 capsule by mouth once a week.       No orders of the defined types were placed in this encounter.   Immunization History  Administered Date(s) Administered  .  Influenza-Unspecified 10/28/2016, 10/07/2017  .  PPD Test 03/20/2013  . Pneumococcal Conjugate-13 10/09/2015  . Pneumococcal Polysaccharide-23 09/02/2014    Social History   Tobacco Use  . Smoking status: Former Smoker    Packs/day: 2.00    Years: 50.00    Pack years: 100.00    Quit date: 08/18/2014    Years since quitting: 4.0  . Smokeless tobacco: Former Network engineer Use Topics  . Alcohol use: No    Comment: quit 2003    Review of Systems  DATA OBTAINED: from patient, nurse GENERAL:  no fevers, fatigue, appetite changes SKIN: No itching, rash HEENT: No complaint RESPIRATORY: No cough, wheezing, SOB CARDIAC: No chest pain, palpitations, lower extremity edema  GI: No abdominal pain, No N/V/D or constipation, No heartburn or reflux  GU: No dysuria, frequency or urgency, or incontinence  MUSCULOSKELETAL: No unrelieved bone/joint pain NEUROLOGIC: No headache, dizziness  PSYCHIATRIC: No overt anxiety or sadness  Vitals:   08/21/18 1018  BP: 119/73  Pulse: 73  Resp: 19  Temp: 99 F (37.2 C)  SpO2: 96%   Body mass index is 39.48 kg/m. Physical Exam  GENERAL APPEARANCE: Alert, conversant, No acute distress, overweight SKIN: No diaphoresis rash HEENT: Unremarkable RESPIRATORY: Breathing is even, unlabored. Lung sounds are clear   CARDIOVASCULAR: Heart RRR no murmurs, rubs or gallops. No peripheral edema  GASTROINTESTINAL: Abdomen is soft, non-tender, not distended w/ normal bowel sounds.  GENITOURINARY: Bladder non tender, not distended  MUSCULOSKELETAL: No abnormal joints or musculature except for stiff joints NEUROLOGIC: Cranial nerves 2-12 grossly intact. Moves all extremities PSYCHIATRIC: Mood and affect appropriate to situation with dementia, no behavioral issues  Patient Active Problem List   Diagnosis Date Noted  . Low folate 08/09/2018  . Cutaneous abscess of back (any part, except buttock) 07/15/2018  . Pneumonia 07/10/2018  . Acute renal failure (ARF)  ([place]) 07/10/2018  . GERD without esophagitis 05/03/2018  . Dysphagia, oropharyngeal 05/03/2018  . Hypertensive heart and renal disease, stage 1-4 or unspecified chronic kidney disease, with heart failure ([place]) 04/25/2018  . Chronic respiratory failure with hypoxia ([place]) 04/25/2018  . Type 2 diabetes mellitus with stage 3 chronic kidney disease and hypertension ([place]) 04/25/2018  . Dyslipidemia associated with type 2 diabetes mellitus ([place]) 04/25/2018  . Chronic constipation 04/25/2018  . Essential tremor 04/25/2018  . CKD stage 3 due to type 2 diabetes mellitus ([place]) 04/25/2018  . Peripheral vascular disease ([place]) 02/04/2017  . Controlled diabetes mellitus type 2 with complications ([place]) 34/19/6222  . Aspiration pneumonia ([place]) 09/30/2015  . Parkinsonism ([place]) 09/30/2015  . Tremor 08/11/2015  . Suicidal ideation 05/10/2015  . Bipolar depression ([place]) 05/10/2015  . Complete heart block ([place]) 03/20/2015  . Bradycardia 03/14/2015  . Dementia with behavioral disturbance ([place]) 02/15/2015  . Staphylococcus aureus bacteremia with sepsis ([place]) 01/28/2015  . Infection of pacemaker pocket ([place]) 01/28/2015  . Acute respiratory failure ([place]) 01/26/2015  . AKI (acute kidney injury) ([place]) 01/26/2015  . Acute encephalopathy 01/26/2015  . Altered mental status   . Vitamin D deficiency 01/07/2015  . Mobitz type 2 second degree atrioventricular block 12/12/2014  . Pacemaker 12/12/2014  . Symptomatic bradycardia 12/03/2014  . Lower back pain 06/12/2012  . Right hip pain 06/12/2012  . Intertrigo 05/09/2012  . Insomnia 04/19/2012  . General weakness 03/07/2012  . 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 Diarrhea 01/17/2012  . Pyelonephritis 01/15/2012  . Diabetic peripheral neuropathy ([place]) 09/28/2011  . Urinary incontinence 09/28/2011  . Preventative health care 09/23/2011  . Gout 09/23/2011  .  Bipolar affective disorder ([place]) 09/23/2011  . TIA (transient ischemic attack)  09/23/2011  . Chronic pain 09/23/2011  . DNR (do not resuscitate) 09/23/2011  . Cerebral infarction ([place]) 09/23/2011  . 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 History of alcohol abuse 09/23/2011  . Benign prostatic hyperplasia   . Chronic systolic CHF (congestive heart failure) ([place]) 05/05/2011  . Edema 01/03/2011  . CAD (coronary artery disease) 05/18/2010  . Hyperlipidemia 05/18/2010  . Other abnormality of urination(788.69) 11/25/2009  . Other specified forms of chronic ischemic heart disease 05/19/2009  . Coronary artery disease involving coronary bypass graft of native heart with angina pectoris ([place]) 11/18/2008  . AMI, INFERIOR WALL 09/30/2008  . Obstructive sleep apnea 09/16/2008  . SINUS TACHYCARDIA 09/02/2008  . DM (diabetes mellitus), type 2 with peripheral vascular complications ([place]) 50/09/3816  . OBESITY 09/01/2008  . Hypertensive heart disease with heart failure ([place]) 09/01/2008  . TOBACCO ABUSE 07/31/2007  . 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    CMP     Component Value Date/Time   NA 143 08/10/2018   K 5.2 08/10/2018   CL 102 03/20/2015 1038   CO2 28 03/20/2015 1038   GLUCOSE 171 (H) 03/20/2015 1038   BUN 87 (A) 08/10/2018   CREATININE 2.3 (A) 08/10/2018   CREATININE 1.23 03/20/2015 1038   CREATININE 1.06 12/05/2014 1536   CALCIUM 9.6 03/20/2015 1038   PROT 5.8 (L) 02/01/2015 0245   ALBUMIN 2.2 (L) 02/01/2015 0245   AST 17 02/11/2017   ALT 6 (A) 02/11/2017   ALKPHOS 21 (A) 02/11/2017   BILITOT 0.4 02/01/2015 0245   GFRNONAA 55 (L) 03/20/2015 1038   GFRAA >60 03/20/2015 1038   Recent Labs    01/23/18 08/06/18 08/10/18  NA 144 138 143  K 4.5 5.2 5.2  BUN 65* 66* 87*  CREATININE 2.7* 2.3* 2.3*   No results for input(s): AST, ALT, ALKPHOS, BILITOT, PROT, ALBUMIN in the last 8760 hours. Recent Labs    08/06/18  WBC 6.2  NEUTROABS 5  HGB 9.3*  HCT 28*  PLT 244   Recent Labs    01/23/18 07/31/18  CHOL 165 120  LDLCALC 63 45  TRIG 311* 148   Lab  Results  Component Value Date   MICROALBUR >42.6 04/09/2016   Lab Results  Component Value Date   TSH 1.47 01/23/2018   Lab Results  Component Value Date   HGBA1C 7.6 01/23/2018   Lab Results  Component Value Date   CHOL 120 07/31/2018   HDL 45 07/31/2018   LDLCALC 45 07/31/2018   LDLDIRECT 85.2 06/13/2012   TRIG 148 07/31/2018   CHOLHDL 7 06/13/2012    Significant Diagnostic Results in last 30 days:  No results found.  Assessment and Plan  Mobitz type 2 second degree atrioventricular block Patient with permanent pacemaker; no reported problems; is checked monthly by cardiology  Chronic respiratory failure with hypoxia ([place]) Patient is stable on O2 2 L nasal cannula which she wears usually on a as needed basis; continue supportive care  Dyslipidemia associated with type 2 diabetes mellitus ([place]) Most recent LDL is 45, HDL 45, excellent control; continue Pravachol 40 mg daily    [name], MD

## 2018-08-22 LAB — BASIC METABOLIC PANEL
BUN: 49 — AB (ref 4–21)
Creatinine: 2.1 — AB (ref 0.6–1.3)
Glucose: 88
Potassium: 5.3 (ref 3.4–5.3)
Sodium: 140 (ref 137–147)

## 2018-08-24 ENCOUNTER — Encounter: Payer: Self-pay | Admitting: Internal Medicine

## 2018-08-24 ENCOUNTER — Non-Acute Institutional Stay (SKILLED_NURSING_FACILITY): Payer: Medicare Other | Admitting: Internal Medicine

## 2018-08-24 DIAGNOSIS — B999 Unspecified infectious disease: Secondary | ICD-10-CM | POA: Diagnosis not present

## 2018-08-24 DIAGNOSIS — U071 COVID-19: Secondary | ICD-10-CM | POA: Diagnosis not present

## 2018-08-24 NOTE — Progress Notes (Signed)
Location:  [facility] Room Number: 114-P Place of Service:  SNF (31)  Victor Duos, MD  Patient Care Team: Victor Duos, MD as PCP - General (Internal Medicine) Victor Lance, MD as Consulting Physician (Cardiology) Victor Bombard, MD as Consulting Physician (Neurology)  Extended Emergency Contact Information Primary Emergency Contact: [name] Address: [place]          [name] [address] [name] of [place] Phone: [phone] Mobile Phone: [phone] Relation: Spouse Secondary Emergency Contact: [name], [name] of [place] Phone: [phone] Mobile Phone: [phone] Relation: Daughter    Allergies: Benzedrine [amphetamine], Dexedrine [dextroamphetamine sulfate er], Oxycodone, and Vancomycin  Chief Complaint  Patient presents with  . Acute Visit    COVID-19 positive    HPI: Patient is a 78 y.o. male who is being seen because his 7/28 COVID test was positive.  Patient is completely asymptomatic with no cough no cold no chest pain no shortness of breath no nausea no vomiting no diarrhea no sinus symptoms no taste or smell symptoms no sore throat no fever.  Past Medical History:  Diagnosis Date  . 2nd degree AV block    a. s/p STJ dual chamber pacemaker 11/2014  . Acute respiratory failure ([place]) 01/26/2015  . AKI (acute kidney injury) ([place]) 01/26/2015  . AMI, INFERIOR WALL 09/30/2008   Qualifier: Diagnosis of  By: Unk Lightning, RN, BSN, Melanie    . Benign neoplasm of colon     Adenomatous polyps  . Benign prostatic hyperplasia   . Bipolar affective disorder ([place])    . Bipolar depression ([name]) 05/10/2015  . BPH (benign prostatic hypertrophy)   . CAD (coronary artery disease)    a. s/p CABG  . CAD, ARTERY BYPASS GRAFT 11/18/2008   Qualifier: Diagnosis of  By: Angelena Form, MD, Harrell Gave    . Chronic pain    . CVA (cerebral infarction)     Small left occipital   . Dementia with behavioral disturbance ([place]) 02/15/2015  . Depression    . Diabetic peripheral neuropathy ([place]) 09/28/2011  . DM (diabetes mellitus), type 2 with peripheral vascular complications ([place]) 6/38/4665   Qualifier: Diagnosis of  By: Burnett Kanaris    . Gout    . History of alcohol abuse    . Hyperlipidemia    . HYPERTENSION        . Hypertensive heart disease with heart failure ([place]) 09/01/2008   Qualifier: Diagnosis of  By: Burnett Kanaris    . Insomnia 04/19/2012  . Ischemic cardiomyopathy    . Narcolepsy    . Obesity   . OBESITY 09/01/2008   Qualifier: Diagnosis of  By: Burnett Kanaris    . OBSTRUCTIVE SLEEP APNEA        . Obstructive sleep apnea 09/16/2008   Qualifier: Diagnosis of  By: [name] MD, [name]   . Pacemaker 12/12/2014  . Parkinsonism ([place]) 09/30/2015  . [facility] spotted fever   . SINUS TACHYCARDIA 09/02/2008   Qualifier: Diagnosis of  By: Caryl Comes, MD, [name]   . TIA (transient ischemic attack)    . TOBACCO ABUSE 07/31/2007   Qualifier: Diagnosis of  By: [name] MD, [name]   . Tremor 08/11/2015  . UNIVERSAL ULCERATIVE COLITIS        . Vitamin D deficiency 01/07/2015    Past Surgical History:  Procedure Laterality Date  . CARDIAC CATHETERIZATION  Triple-vessel coronary artery disease. Low-normal left ventricular systolic function with mild   anteroapical wall motion abnormality.   [name] Kitchen CARDIAC CATHETERIZATION N/A 01/28/2015   Procedure: Temporary Pacemaker;  Surgeon: Victor Lance, MD;  Location: [facility] LAB;  Service: Cardiovascular;  Laterality: N/A;  . CORONARY ARTERY BYPASS GRAFT     Median sternotomy, extracorporeal circulation,  coronary artery bypass graft surgery x4 using a sequential left internal   mammary artery graft to the mid and distal left anterior descending, a   saphenous vein graft to diagonal branch of the left anterior descending,   and a saphenous vein graft to the obtuse marginal branch of left    circumflex coronary artery.    . EP IMPLANTABLE DEVICE N/A 12/12/2014   Procedure: Pacemaker Implant;  Surgeon: Victor Lance, MD;  Location: [facility] LAB;  Service: Cardiovascular;  Laterality: N/A;  . EP IMPLANTABLE DEVICE N/A 01/28/2015   Procedure: PPM Generator Removal;  Surgeon: Victor Lance, MD;  Location: [facility] LAB;  Service: Cardiovascular;  Laterality: N/A;  . EP IMPLANTABLE DEVICE N/A 03/20/2015   Procedure: Pacemaker Implant;  Surgeon: Victor Lance, MD;  Location: [facility] LAB;  Service: Cardiovascular;  Laterality: N/A;  . FLEXIBLE SIGMOIDOSCOPY  01/17/2012   Procedure: FLEXIBLE SIGMOIDOSCOPY;  Surgeon: Lafayette Dragon, MD;  Location: [facility];  Service: Endoscopy;  Laterality: N/A;  . MULTIPLE EXTRACTIONS WITH ALVEOLOPLASTY  12/01/2011   Procedure: MULTIPLE EXTRACION WITH ALVEOLOPLASTY;  Surgeon: Lenn Cal, DDS;  Location: [facility];  Service: Oral Surgery;  Laterality: N/A;  Extraction of tooth #'s 3,4,5,6,7,8,9,10,11,12,13,14,15,16,17,20,21,22,23,24,25,26,27,28,29,30,31,32 with alveoloplasty and bilateral mandibular lingual tori  . TONSILECTOMY, ADENOIDECTOMY, BILATERAL MYRINGOTOMY AND TUBES  1948    Allergies as of 08/24/2018      Reactions   Benzedrine [amphetamine] Nausea Only   Dexedrine [dextroamphetamine Sulfate Er] Other (See Comments)   agitation   Oxycodone Nausea And Vomiting   Vancomycin    Unknown - per [facility]      Medication List       Accurate as of August 24, 2018  2:08 PM. If you have any questions, ask your nurse or doctor.        aspirin 81 MG chewable tablet Chew 81 mg by mouth at bedtime.   carbidopa-levodopa 25-100 MG tablet Commonly known as: SINEMET IR Take 2 tablets by mouth 3 (three) times daily with meals.   carvedilol 3.125 MG tablet Commonly known as: COREG Take 3.125 mg by mouth 2 (two) times daily with a meal. Hold for SBP less than 105   clonazePAM 0.5 MG tablet Commonly known as: KLONOPIN Take 1 tablet  (0.5 mg total) by mouth at bedtime.   docusate sodium 100 MG capsule Commonly known as: COLACE Take 100 mg by mouth daily as needed for mild constipation.   donepezil 10 MG tablet Commonly known as: ARICEPT Take 10 mg by mouth at bedtime.   doxazosin 1 MG tablet Commonly known as: CARDURA Take 1 mg by mouth at bedtime.   fenofibrate 145 MG tablet Commonly known as: TRICOR Take 145 mg by mouth daily. With a meal   ferrous sulfate 325 (65 FE) MG tablet Take 325 mg by mouth 2 (two) times daily with a meal. x3 months, ending 10/31/18   Fish Oil 1000 MG Caps Take 1 capsule by mouth 3 (three) times daily.   folic acid 1 MG tablet Commonly known as: FOLVITE Take 1 mg by mouth daily.   furosemide  40 MG tablet Commonly known as: LASIX Take 40 mg by mouth daily.   gabapentin 400 MG capsule Commonly known as: NEURONTIN Take 400 mg by mouth 3 (three) times daily.   HumaLOG KwikPen 100 UNIT/ML KiwkPen Generic drug: insulin lispro Inject 20 Units into the skin 3 (three) times daily. Hold for CBG  < 200. ( to be given in addition to sliding scale) Check FSBS before meals and at bedtime. SSI 201-250 = 4 units, 251-300 = 6 units, 301-350 = 8 units, 351-400 = 10 units   ipratropium-albuterol 0.5-2.5 (3) MG/3ML Soln Commonly known as: DUONEB Take 3 mLs by nebulization every 4 (four) hours as needed (SOB/congestion).   lactose free nutrition Liqd Take 237 mLs by mouth 2 (two) times daily between meals. Prefers chocolate   lamoTRIgine 25 MG tablet Commonly known as: LAMICTAL Take 25 mg by mouth daily. give one tab with Lamictal 100 mg to equal 125 mg daily   lamoTRIgine 100 MG tablet Commonly known as: LAMICTAL Take 100 mg by mouth daily. Give 100 mg tablet along with 25 mg tablet to equal 125 mg total   Lexapro 5 MG tablet Generic drug: escitalopram Take 5 mg by mouth daily.   losartan 50 MG tablet Commonly known as: COZAAR Take 50 mg by mouth every morning.   Magnesium  Oxide 500 MG Tabs Take 1 tablet by mouth daily.   metFORMIN 1000 MG tablet Commonly known as: GLUCOPHAGE Take 1,000 mg by mouth 2 (two) times daily.   multivitamin tablet Take 1 tablet by mouth daily.   nitroGLYCERIN 0.4 MG SL tablet Commonly known as: NITROSTAT Place 0.4 mg under the tongue every 5 (five) minutes as needed for chest pain. x3 doses PRN, notify MD if no relief   omeprazole 20 MG tablet Commonly known as: PRILOSEC OTC Take 20 mg by mouth daily.   OXYGEN Inhale 2 L into the lungs continuous. To keep spo2 above 90%   oxymetazoline 0.05 % nasal spray Commonly known as: AFRIN Place 1 spray into both nostrils as needed (Nose bleed).   polyethylene glycol 17 g packet Commonly known as: MIRALAX / GLYCOLAX Take 17 g by mouth daily.   pravastatin 40 MG tablet Commonly known as: PRAVACHOL Take 40 mg by mouth at bedtime.   primidone 50 MG tablet Commonly known as: MYSOLINE Take 25 mg by mouth at bedtime. 0.5 tablet to = 25 mg   promethazine 25 MG tablet Commonly known as: PHENERGAN Take 25 mg by mouth every 6 (six) hours as needed for nausea or vomiting. x3 doses. Notify MD if symptoms persist for more than 24 hours   Skin Prep Wipes Misc Apply to bilateral heels and protective foam dressing every 3 days   Toujeo Max SoloStar 300 UNIT/ML Sopn Generic drug: Insulin Glargine Inject 35 Units into the skin at bedtime.   traMADol 50 MG tablet Commonly known as: ULTRAM Take 50 mg by mouth every 8 (eight) hours. Give each shift   traZODone 50 MG tablet Commonly known as: DESYREL Take 25 mg by mouth at bedtime. 0.5 tablet to = 25 mg What changed: Another medication with the same name was removed. Continue taking this medication, and follow the directions you see here. Changed by: Inocencio Homes, MD   vitamin C 500 MG tablet Commonly known as: ASCORBIC ACID Take 1,000 mg by mouth daily. 2 tablets to = 1000 mg x3 months, ending 10/31/18   Vitamin D3 1.25 MG  (50000 UT) Caps Take 1 capsule by  mouth once a week.   Zinc Sulfate 220 (50 Zn) MG Tabs Take 1 tablet by mouth daily. x14 days - ending 09/05/18       No orders of the defined types were placed in this encounter.   Immunization History  Administered Date(s) Administered  . Influenza-Unspecified 10/28/2016, 10/07/2017  . PPD Test 03/20/2013  . Pneumococcal Conjugate-13 10/09/2015  . Pneumococcal Polysaccharide-23 09/02/2014    Social History   Tobacco Use  . Smoking status: Former Smoker    Packs/day: 2.00    Years: 50.00    Pack years: 100.00    Quit date: 08/18/2014    Years since quitting: 4.0  . Smokeless tobacco: Former Network engineer Use Topics  . Alcohol use: No    Comment: quit 2003    Review of Systems  DATA OBTAINED: from patient, nurse GENERAL:  no fevers, fatigue, appetite changes SKIN: No itching, rash HEENT: No complaint RESPIRATORY: No cough, wheezing, SOB CARDIAC: No chest pain, palpitations, lower extremity edema  GI: No abdominal pain, No N/V/D or constipation, No heartburn or reflux  GU: No dysuria, frequency or urgency, or incontinence  MUSCULOSKELETAL: No unrelieved bone/joint pain NEUROLOGIC: No headache, dizziness  PSYCHIATRIC: No overt anxiety or sadness  Vitals:   08/24/18 1152  BP: 120/70  Pulse: 98  Resp: 18  Temp: 98 F (36.7 C)  SpO2: 96%   Body mass index is 39.48 kg/m. Physical Exam  GENERAL APPEARANCE: Alert, conversant, No acute distress  SKIN: No diaphoresis rash HEENT: Unremarkable RESPIRATORY: Breathing is even, unlabored. Lung sounds are clear   CARDIOVASCULAR: Heart RRR no murmurs, rubs or gallops. No peripheral edema  GASTROINTESTINAL: Abdomen is soft, non-tender, not distended w/ normal bowel sounds.  GENITOURINARY: Bladder non tender, not distended  MUSCULOSKELETAL: No abnormal joints or musculature NEUROLOGIC: Cranial nerves 2-12 grossly intact. Moves all extremities PSYCHIATRIC: Mood and affect with  dementia, no behavioral issues  Patient Active Problem List   Diagnosis Date Noted  . Low folate 08/09/2018  . Cutaneous abscess of back (any part, except buttock) 07/15/2018  . Pneumonia 07/10/2018  . Acute renal failure (ARF) ([place]) 07/10/2018  . GERD without esophagitis 05/03/2018  . Dysphagia, oropharyngeal 05/03/2018  . Hypertensive heart and renal disease, stage 1-4 or unspecified chronic kidney disease, with heart failure ([place]) 04/25/2018  . Chronic respiratory failure with hypoxia ([place]) 04/25/2018  . Type 2 diabetes mellitus with stage 3 chronic kidney disease and hypertension ([place]) 04/25/2018  . Dyslipidemia associated with type 2 diabetes mellitus ([place]) 04/25/2018  . Chronic constipation 04/25/2018  . Essential tremor 04/25/2018  . CKD stage 3 due to type 2 diabetes mellitus ([place]) 04/25/2018  . Peripheral vascular disease ([place]) 02/04/2017  . Controlled diabetes mellitus type 2 with complications ([place]) 14/97/0263  . Aspiration pneumonia ([place]) 09/30/2015  . Parkinsonism ([place]) 09/30/2015  . Tremor 08/11/2015  . Suicidal ideation 05/10/2015  . Bipolar depression ([place]) 05/10/2015  . Complete heart block ([place]) 03/20/2015  . Bradycardia 03/14/2015  . Dementia with behavioral disturbance ([place]) 02/15/2015  . Staphylococcus aureus bacteremia with sepsis ([place]) 01/28/2015  . Infection of pacemaker pocket ([place]) 01/28/2015  . Acute respiratory failure ([place]) 01/26/2015  . AKI (acute kidney injury) ([place]) 01/26/2015  . Acute encephalopathy 01/26/2015  . Altered mental status   . Vitamin D deficiency 01/07/2015  . Mobitz type 2 second degree atrioventricular block 12/12/2014  . Pacemaker 12/12/2014  . Symptomatic bradycardia 12/03/2014  . Lower back pain 06/12/2012  . Right hip pain 06/12/2012  .  Intertrigo 05/09/2012  . Insomnia 04/19/2012  . General weakness 03/07/2012  . 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 Diarrhea 01/17/2012  . Pyelonephritis 01/15/2012   . Diabetic peripheral neuropathy ([place]) 09/28/2011  . Urinary incontinence 09/28/2011  . Preventative health care 09/23/2011  . Gout 09/23/2011  . Bipolar affective disorder ([place]) 09/23/2011  . TIA (transient ischemic attack) 09/23/2011  . Chronic pain 09/23/2011  . DNR (do not resuscitate) 09/23/2011  . Cerebral infarction ([place]) 09/23/2011  . 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 History of alcohol abuse 09/23/2011  . Benign prostatic hyperplasia   . Chronic systolic CHF (congestive heart failure) ([place]) 05/05/2011  . Edema 01/03/2011  . CAD (coronary artery disease) 05/18/2010  . Hyperlipidemia 05/18/2010  . Other abnormality of urination(788.69) 11/25/2009  . Other specified forms of chronic ischemic heart disease 05/19/2009  . Coronary artery disease involving coronary bypass graft of native heart with angina pectoris ([place]) 11/18/2008  . AMI, INFERIOR WALL 09/30/2008  . Obstructive sleep apnea 09/16/2008  . SINUS TACHYCARDIA 09/02/2008  . DM (diabetes mellitus), type 2 with peripheral vascular complications ([place]) 16/10/9602  . OBESITY 09/01/2008  . Hypertensive heart disease with heart failure ([place]) 09/01/2008  . TOBACCO ABUSE 07/31/2007  . 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    CMP     Component Value Date/Time   NA 143 08/10/2018   K 5.2 08/10/2018   CL 102 03/20/2015 1038   CO2 28 03/20/2015 1038   GLUCOSE 171 (H) 03/20/2015 1038   BUN 87 (A) 08/10/2018   CREATININE 2.3 (A) 08/10/2018   CREATININE 1.23 03/20/2015 1038   CREATININE 1.06 12/05/2014 1536   CALCIUM 9.6 03/20/2015 1038   PROT 5.8 (L) 02/01/2015 0245   ALBUMIN 2.2 (L) 02/01/2015 0245   AST 17 02/11/2017   ALT 6 (A) 02/11/2017   ALKPHOS 21 (A) 02/11/2017   BILITOT 0.4 02/01/2015 0245   GFRNONAA 55 (L) 03/20/2015 1038   GFRAA >60 03/20/2015 1038   Recent Labs    01/23/18 08/06/18 08/10/18  NA 144 138 143  K 4.5 5.2 5.2  BUN 65* 66* 87*  CREATININE 2.7* 2.3* 2.3*   No results for input(s): AST,  ALT, ALKPHOS, BILITOT, PROT, ALBUMIN in the last 8760 hours. Recent Labs    08/06/18  WBC 6.2  NEUTROABS 5  HGB 9.3*  HCT 28*  PLT 244   Recent Labs    01/23/18 07/31/18  CHOL 165 120  LDLCALC 63 45  TRIG 311* 148   Lab Results  Component Value Date   MICROALBUR >42.6 04/09/2016   Lab Results  Component Value Date   TSH 1.47 01/23/2018   Lab Results  Component Value Date   HGBA1C 7.6 01/23/2018   Lab Results  Component Value Date   CHOL 120 07/31/2018   HDL 45 07/31/2018   LDLCALC 45 07/31/2018   LDLDIRECT 85.2 06/13/2012   TRIG 148 07/31/2018   CHOLHDL 7 06/13/2012    Significant Diagnostic Results in last 30 days:  No results found.  Assessment and Plan  COVID-19 positive/isolation for infectious airborne diseases- patient is positive from a 7/28 test from the health department he is totally asymptomatic; according to the [place] patients who are asymptomatic only need to be in isolation for 10 days and patient will be leaving the isolation unit tomorrow; in the meantime patient requires full PPE for any kind of evaluation; continue supportive care; continue vigilant observation factors    Victor Duos, MD

## 2018-08-25 ENCOUNTER — Encounter: Payer: Self-pay | Admitting: Internal Medicine

## 2018-08-25 NOTE — Assessment & Plan Note (Signed)
Most recent LDL is 45, HDL 45, excellent control; continue Pravachol 40 mg daily

## 2018-08-25 NOTE — Assessment & Plan Note (Signed)
Patient is stable on O2 2 L nasal cannula which she wears usually on a as needed basis; continue supportive care

## 2018-08-25 NOTE — Assessment & Plan Note (Signed)
Patient with permanent pacemaker; no reported problems; is checked monthly by cardiology

## 2018-08-26 ENCOUNTER — Encounter: Payer: Self-pay | Admitting: Internal Medicine

## 2018-08-27 ENCOUNTER — Non-Acute Institutional Stay (SKILLED_NURSING_FACILITY): Payer: Medicare Other | Admitting: Internal Medicine

## 2018-08-27 DIAGNOSIS — B999 Unspecified infectious disease: Secondary | ICD-10-CM | POA: Diagnosis not present

## 2018-08-27 DIAGNOSIS — R1312 Dysphagia, oropharyngeal phase: Secondary | ICD-10-CM

## 2018-08-27 DIAGNOSIS — U071 COVID-19: Secondary | ICD-10-CM | POA: Diagnosis not present

## 2018-08-27 DIAGNOSIS — R05 Cough: Secondary | ICD-10-CM

## 2018-08-27 DIAGNOSIS — R059 Cough, unspecified: Secondary | ICD-10-CM

## 2018-08-30 NOTE — Progress Notes (Signed)
Remote pacemaker transmission.   

## 2018-09-01 ENCOUNTER — Encounter: Payer: Self-pay | Admitting: Internal Medicine

## 2018-09-01 DIAGNOSIS — R05 Cough: Secondary | ICD-10-CM | POA: Insufficient documentation

## 2018-09-01 DIAGNOSIS — R059 Cough, unspecified: Secondary | ICD-10-CM | POA: Insufficient documentation

## 2018-09-01 NOTE — Progress Notes (Signed)
Location:  [place] of Service:   SNF  [name], MD  Patient Care Team: [name], MD as PCP - General (Internal Medicine) [name], MD as Consulting Physician (Cardiology) [name], MD as Consulting Physician (Neurology)  Extended Emergency Contact Information Primary Emergency Contact: [name],[name] Address: [address]          [place], [place] [address] [name] of [place] Phone: [phone] Mobile Phone: [phone] Relation: Spouse Secondary Emergency Contact: [name], [name] of [place] Phone: [phone] Mobile Phone: [phone] Relation: Daughter    Allergies: Benzedrine [amphetamine], Dexedrine [dextroamphetamine sulfate er], Oxycodone, and Vancomycin  Chief Complaint  Patient presents with  . Acute Visit    HPI: Patient is 78 y.o. male who is being seen because his 7/28 COVID tests from the [place] has been returned positive and patient is being put back in the isolation unit.  Patient says I have been coughing for weeks.  Prior to being put back in the unit patient was treated for aspiration pneumonia with the appropriate antibiotics.  Patient says he only coughs when he eats and nursing confirms this.  Patient has had no fever chills nausea vomiting chest pain shortness of breath sore throat sinus infections problems with taste or smell or any other symptoms of COVID other than coughing when he eats.  Past Medical History:  Diagnosis Date  . 2nd degree AV block    a. s/p STJ dual chamber pacemaker 11/2014  . Acute respiratory failure ([place]) 01/26/2015  . AKI (acute kidney injury) ([place]) 01/26/2015  . AMI, INFERIOR WALL 09/30/2008   Qualifier: Diagnosis of  By: Unk Lightning, RN, BSN, Melanie    . Benign neoplasm of colon     Adenomatous polyps  . Benign prostatic hyperplasia   . Bipolar affective disorder ([place])    . Bipolar depression  ([place]) 05/10/2015  . BPH (benign prostatic hypertrophy)   . CAD (coronary artery disease)    a. s/p CABG  . CAD, ARTERY BYPASS GRAFT 11/18/2008   Qualifier: Diagnosis of  By: Angelena Form, MD, Harrell Gave    . Chronic pain    . CVA (cerebral infarction)     Small left occipital  . Dementia with behavioral disturbance ([place]) 02/15/2015  . Depression    . Diabetic peripheral neuropathy ([place]) 09/28/2011  . DM (diabetes mellitus), type 2 with peripheral vascular complications ([place]) 2/44/0102   Qualifier: Diagnosis of  By: Burnett Kanaris    . Gout    . History of alcohol abuse    . Hyperlipidemia    . HYPERTENSION        . Hypertensive heart disease with heart failure ([place]) 09/01/2008   Qualifier: Diagnosis of  By: Burnett Kanaris    . Insomnia 04/19/2012  . Ischemic cardiomyopathy    . Narcolepsy    . Obesity   . OBESITY 09/01/2008   Qualifier: Diagnosis of  By: Burnett Kanaris    . OBSTRUCTIVE SLEEP APNEA        . Obstructive sleep apnea 09/16/2008   Qualifier: Diagnosis of  By: [name] MD, [name]   . Pacemaker 12/12/2014  . Parkinsonism ([place]) 09/30/2015  . [facility] spotted fever   . SINUS TACHYCARDIA 09/02/2008   Qualifier: Diagnosis of  By: Caryl Comes, MD, [name]   . TIA (transient ischemic attack)    . TOBACCO ABUSE 07/31/2007  Qualifier: Diagnosis of  By: [name] MD, [name]   . Tremor 08/11/2015  . UNIVERSAL ULCERATIVE COLITIS        . Vitamin D deficiency 01/07/2015    Past Surgical History:  Procedure Laterality Date  . CARDIAC CATHETERIZATION     Triple-vessel coronary artery disease. Low-normal left ventricular systolic function with mild   anteroapical wall motion abnormality.   [name] Kitchen CARDIAC CATHETERIZATION N/A 01/28/2015   Procedure: Temporary Pacemaker;  Surgeon: [name], MD;  Location: [facility];  Service: Cardiovascular;  Laterality: N/A;  . CORONARY ARTERY BYPASS GRAFT     Median sternotomy, extracorporeal circulation,  coronary artery  bypass graft surgery x4 using a sequential left internal   mammary artery graft to the mid and distal left anterior descending, a   saphenous vein graft to diagonal branch of the left anterior descending,   and a saphenous vein graft to the obtuse marginal branch of left   circumflex coronary artery.    . EP IMPLANTABLE DEVICE N/A 12/12/2014   Procedure: Pacemaker Implant;  Surgeon: [name], MD;  Location: [facility];  Service: Cardiovascular;  Laterality: N/A;  . EP IMPLANTABLE DEVICE N/A 01/28/2015   Procedure: PPM Generator Removal;  Surgeon: [name], MD;  Location: [facility];  Service: Cardiovascular;  Laterality: N/A;  . EP IMPLANTABLE DEVICE N/A 03/20/2015   Procedure: Pacemaker Implant;  Surgeon: [name], MD;  Location: [facility];  Service: Cardiovascular;  Laterality: N/A;  . FLEXIBLE SIGMOIDOSCOPY  01/17/2012   Procedure: FLEXIBLE SIGMOIDOSCOPY;  Surgeon: Lafayette Dragon, MD;  Location: [facility];  Service: Endoscopy;  Laterality: N/A;  . MULTIPLE EXTRACTIONS WITH ALVEOLOPLASTY  12/01/2011   Procedure: MULTIPLE EXTRACION WITH ALVEOLOPLASTY;  Surgeon: Lenn Cal, DDS;  Location: [facility];  Service: Oral Surgery;  Laterality: N/A;  Extraction of tooth #'s 3,4,5,6,7,8,9,10,11,12,13,14,15,16,17,20,21,22,23,24,25,26,27,28,29,30,31,32 with alveoloplasty and bilateral mandibular lingual tori  . TONSILECTOMY, ADENOIDECTOMY, BILATERAL MYRINGOTOMY AND TUBES  1948    Allergies as of 08/27/2018      Reactions   Benzedrine [amphetamine] Nausea Only   Dexedrine [dextroamphetamine Sulfate Er] Other (See Comments)   agitation   Oxycodone Nausea And Vomiting   Vancomycin    Unknown - per [facility]      Medication List       Accurate as of August 27, 2018 11:59 PM. If you have any questions, ask your nurse or doctor.        aspirin 81 MG chewable tablet Chew 81 mg by mouth at bedtime.   carbidopa-levodopa 25-100 MG tablet Commonly known as: SINEMET  IR Take 2 tablets by mouth 3 (three) times daily with meals.   carvedilol 3.125 MG tablet Commonly known as: COREG Take 3.125 mg by mouth 2 (two) times daily with a meal. Hold for SBP less than 105   clonazePAM 0.5 MG tablet Commonly known as: KLONOPIN Take 1 tablet (0.5 mg total) by mouth at bedtime.   docusate sodium 100 MG capsule Commonly known as: COLACE Take 100 mg by mouth daily as needed for mild constipation.   donepezil 10 MG tablet Commonly known as: ARICEPT Take 10 mg by mouth at bedtime.   doxazosin 1 MG tablet Commonly known as: CARDURA Take 1 mg by mouth at bedtime.   fenofibrate 145 MG tablet Commonly known as: TRICOR Take 145 mg by mouth daily. With a meal   ferrous sulfate 325 (65 FE) MG tablet Take 325 mg by  mouth 2 (two) times daily with a meal. x3 months, ending 10/31/18   Fish Oil 1000 MG Caps Take 1 capsule by mouth 3 (three) times daily.   folic acid 1 MG tablet Commonly known as: FOLVITE Take 1 mg by mouth daily.   furosemide 40 MG tablet Commonly known as: LASIX Take 40 mg by mouth daily.   gabapentin 400 MG capsule Commonly known as: NEURONTIN Take 400 mg by mouth 3 (three) times daily.   HumaLOG KwikPen 100 UNIT/ML KiwkPen Generic drug: insulin lispro Inject 20 Units into the skin 3 (three) times daily. Hold for CBG  < 200. ( to be given in addition to sliding scale) Check FSBS before meals and at bedtime. SSI 201-250 = 4 units, 251-300 = 6 units, 301-350 = 8 units, 351-400 = 10 units   ipratropium-albuterol 0.5-2.5 (3) MG/3ML Soln Commonly known as: DUONEB Take 3 mLs by nebulization every 4 (four) hours as needed (SOB/congestion).   lactose free nutrition Liqd Take 237 mLs by mouth 2 (two) times daily between meals. Prefers chocolate   lamoTRIgine 25 MG tablet Commonly known as: LAMICTAL Take 25 mg by mouth daily. give one tab with Lamictal 100 mg to equal 125 mg daily   lamoTRIgine 100 MG tablet Commonly known as: LAMICTAL  Take 100 mg by mouth daily. Give 100 mg tablet along with 25 mg tablet to equal 125 mg total   Lexapro 5 MG tablet Generic drug: escitalopram Take 5 mg by mouth daily.   losartan 50 MG tablet Commonly known as: COZAAR Take 50 mg by mouth every morning.   Magnesium Oxide 500 MG Tabs Take 1 tablet by mouth daily.   metFORMIN 1000 MG tablet Commonly known as: GLUCOPHAGE Take 1,000 mg by mouth 2 (two) times daily.   multivitamin tablet Take 1 tablet by mouth daily.   nitroGLYCERIN 0.4 MG SL tablet Commonly known as: NITROSTAT Place 0.4 mg under the tongue every 5 (five) minutes as needed for chest pain. x3 doses PRN, notify MD if no relief   omeprazole 20 MG tablet Commonly known as: PRILOSEC OTC Take 20 mg by mouth daily.   OXYGEN Inhale 2 L into the lungs continuous. To keep spo2 above 90%   oxymetazoline 0.05 % nasal spray Commonly known as: AFRIN Place 1 spray into both nostrils as needed (Nose bleed).   polyethylene glycol 17 g packet Commonly known as: MIRALAX / GLYCOLAX Take 17 g by mouth daily.   pravastatin 40 MG tablet Commonly known as: PRAVACHOL Take 40 mg by mouth at bedtime.   primidone 50 MG tablet Commonly known as: MYSOLINE Take 25 mg by mouth at bedtime. 0.5 tablet to = 25 mg   promethazine 25 MG tablet Commonly known as: PHENERGAN Take 25 mg by mouth every 6 (six) hours as needed for nausea or vomiting. x3 doses. Notify MD if symptoms persist for more than 24 hours   Skin Prep Wipes Misc Apply to bilateral heels and protective foam dressing every 3 days   Toujeo Max SoloStar 300 UNIT/ML Sopn Generic drug: Insulin Glargine Inject 35 Units into the skin at bedtime.   traMADol 50 MG tablet Commonly known as: ULTRAM Take 50 mg by mouth every 8 (eight) hours. Give each shift   traZODone 50 MG tablet Commonly known as: DESYREL Take 25 mg by mouth at bedtime. 0.5 tablet to = 25 mg   vitamin C 500 MG tablet Commonly known as: ASCORBIC ACID  Take 1,000 mg by mouth daily. 2  tablets to = 1000 mg x3 months, ending 10/31/18   Vitamin D3 1.25 MG (50000 UT) Caps Take 1 capsule by mouth once a week.   Zinc Sulfate 220 (50 Zn) MG Tabs Take 1 tablet by mouth daily. x14 days - ending 09/05/18       No orders of the defined types were placed in this encounter.   Immunization History  Administered Date(s) Administered  . Influenza-Unspecified 10/28/2016, 10/07/2017  . PPD Test 03/20/2013  . Pneumococcal Conjugate-13 10/09/2015  . Pneumococcal Polysaccharide-23 09/02/2014    Social History   Tobacco Use  . Smoking status: Former Smoker    Packs/day: 2.00    Years: 50.00    Pack years: 100.00    Quit date: 08/18/2014    Years since quitting: 4.0  . Smokeless tobacco: Former Network engineer Use Topics  . Alcohol use: No    Comment: quit 2003    Review of Systems  DATA OBTAINED: from patient, nurse GENERAL:  no fevers, fatigue, appetite changes SKIN: No itching, rash HEENT: No complaint RESPIRATORY:  cough when he eats, no wheezing, SOB CARDIAC: No chest pain, palpitations, lower extremity edema  GI: No abdominal pain, No N/V/D or constipation, No heartburn or reflux  GU: No dysuria, frequency or urgency, or incontinence  MUSCULOSKELETAL: No unrelieved bone/joint pain NEUROLOGIC: No headache, dizziness  PSYCHIATRIC: No overt anxiety or sadness  Vitals:   09/01/18 2102  BP: 121/80  Pulse: 72  Resp: 18  Temp: (!) 97 F (36.1 C)   Body mass index is 39.48 kg/m. Physical Exam  GENERAL APPEARANCE: Alert, conversant, No acute distress, obese white male SKIN: No diaphoresis rash HEENT: Unremarkable RESPIRATORY: Breathing is even, unlabored. Lung sounds are diffusely decreased throughout CARDIOVASCULAR: Heart RRR no murmurs, rubs or gallops. No peripheral edema  GASTROINTESTINAL: Abdomen is soft, non-tender, not distended w/ normal bowel sounds.  GENITOURINARY: Bladder non tender, not distended   MUSCULOSKELETAL: No abnormal joints or musculature, but extremities are stiff NEUROLOGIC: Cranial nerves 2-12 grossly intact. Moves all extremities PSYCHIATRIC: Mood and affect appropriate to situation with some dementia, no behavioral issues  Patient Active Problem List   Diagnosis Date Noted  . Real time reverse transcriptase PCR positive for COVID-19 virus 08/27/2018  . Infection requiring airborne isolation precautions 08/27/2018  . Low folate 08/09/2018  . Cutaneous abscess of back (any part, except buttock) 07/15/2018  . Pneumonia 07/10/2018  . Acute renal failure (ARF) ([place]) 07/10/2018  . GERD without esophagitis 05/03/2018  . Dysphagia, oropharyngeal 05/03/2018  . Hypertensive heart and renal disease, stage 1-4 or unspecified chronic kidney disease, with heart failure ([place]) 04/25/2018  . Chronic respiratory failure with hypoxia ([place]) 04/25/2018  . Type 2 diabetes mellitus with stage 3 chronic kidney disease and hypertension ([place]) 04/25/2018  . Dyslipidemia associated with type 2 diabetes mellitus ([place]) 04/25/2018  . Chronic constipation 04/25/2018  . Essential tremor 04/25/2018  . CKD stage 3 due to type 2 diabetes mellitus ([place]) 04/25/2018  . Peripheral vascular disease ([place]) 02/04/2017  . Controlled diabetes mellitus type 2 with complications ([place]) 40/10/2723  . Aspiration pneumonia ([place]) 09/30/2015  . Parkinsonism ([place]) 09/30/2015  . Tremor 08/11/2015  . Suicidal ideation 05/10/2015  . Bipolar depression ([place]) 05/10/2015  . Complete heart block ([place]) 03/20/2015  . Bradycardia 03/14/2015  . Dementia with behavioral disturbance ([place]) 02/15/2015  . Staphylococcus aureus bacteremia with sepsis ([place]) 01/28/2015  . Infection of pacemaker pocket ([place]) 01/28/2015  . Acute respiratory failure ([place]) 01/26/2015  . AKI (acute  kidney injury) ([place]) 01/26/2015  . Acute encephalopathy 01/26/2015  . Altered mental status   . Vitamin D deficiency 01/07/2015  . Mobitz type 2 second  degree atrioventricular block 12/12/2014  . Pacemaker 12/12/2014  . Symptomatic bradycardia 12/03/2014  . Lower back pain 06/12/2012  . Right hip pain 06/12/2012  . Intertrigo 05/09/2012  . Insomnia 04/19/2012  . General weakness 03/07/2012  . 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 Diarrhea 01/17/2012  . Pyelonephritis 01/15/2012  . Diabetic peripheral neuropathy ([place]) 09/28/2011  . Urinary incontinence 09/28/2011  . Preventative health care 09/23/2011  . Gout 09/23/2011  . Bipolar affective disorder ([place]) 09/23/2011  . TIA (transient ischemic attack) 09/23/2011  . Chronic pain 09/23/2011  . DNR (do not resuscitate) 09/23/2011  . Cerebral infarction ([place]) 09/23/2011  . 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 History of alcohol abuse 09/23/2011  . Benign prostatic hyperplasia   . Chronic systolic CHF (congestive heart failure) ([place]) 05/05/2011  . Edema 01/03/2011  . CAD (coronary artery disease) 05/18/2010  . Hyperlipidemia 05/18/2010  . Other abnormality of urination(788.69) 11/25/2009  . Other specified forms of chronic ischemic heart disease 05/19/2009  . Coronary artery disease involving coronary bypass graft of native heart with angina pectoris ([place]) 11/18/2008  . AMI, INFERIOR WALL 09/30/2008  . Obstructive sleep apnea 09/16/2008  . SINUS TACHYCARDIA 09/02/2008  . DM (diabetes mellitus), type 2 with peripheral vascular complications ([place]) 17/00/1749  . OBESITY 09/01/2008  . Hypertensive heart disease with heart failure ([place]) 09/01/2008  . TOBACCO ABUSE 07/31/2007  . 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    CMP     Component Value Date/Time   NA 140 08/22/2018   K 5.3 08/22/2018   CL 102 03/20/2015 1038   CO2 28 03/20/2015 1038   GLUCOSE 171 (H) 03/20/2015 1038   BUN 49 (A) 08/22/2018   CREATININE 2.1 (A) 08/22/2018   CREATININE 1.23 03/20/2015 1038   CREATININE 1.06 12/05/2014 1536   CALCIUM 9.6 03/20/2015 1038   PROT 5.8 (L) 02/01/2015 0245    ALBUMIN 2.2 (L) 02/01/2015 0245   AST 17 02/11/2017   ALT 6 (A) 02/11/2017   ALKPHOS 21 (A) 02/11/2017   BILITOT 0.4 02/01/2015 0245   GFRNONAA 55 (L) 03/20/2015 1038   GFRAA >60 03/20/2015 1038   Recent Labs    08/13/18 08/15/18 08/22/18  NA 140 141 140  K 4.8 4.6 5.3  BUN 64* 48* 49*  CREATININE 2.3* 1.7* 2.1*   No results for input(s): AST, ALT, ALKPHOS, BILITOT, PROT, ALBUMIN in the last 8760 hours. Recent Labs    08/06/18  WBC 6.2  NEUTROABS 5  HGB 9.3*  HCT 28*  PLT 244   Recent Labs    01/23/18 07/31/18  CHOL 165 120  LDLCALC 63 45  TRIG 311* 148   Lab Results  Component Value Date   MICROALBUR >42.6 04/09/2016   Lab Results  Component Value Date   TSH 1.47 01/23/2018   Lab Results  Component Value Date   HGBA1C 7.6 01/23/2018   Lab Results  Component Value Date   CHOL 120 07/31/2018   HDL 45 07/31/2018   LDLCALC 45 07/31/2018   LDLDIRECT 85.2 06/13/2012   TRIG 148 07/31/2018   CHOLHDL 7 06/13/2012    Significant Diagnostic Results in last 30 days:  No results found.  Assessment and Plan  COVID-19 positive/cough with eating/oral pharyngeal dysphasia- patient's test was 7/28, today is 8/10, patient is almost finished with his 14 days; patient is known to cough  with the eating, patient is known to have aspiration pneumonia, I feel that is entirely probable that patient's cough is aspiration and not due to COVID; out of an abundance of caution we are obtaining a chest x-ray and patient will be monitored closely; in the meantime patient is in the COVID unit where full PPE is required for any visit    [name], MD

## 2018-09-03 ENCOUNTER — Encounter: Payer: Self-pay | Admitting: Internal Medicine

## 2018-09-03 ENCOUNTER — Non-Acute Institutional Stay (SKILLED_NURSING_FACILITY): Payer: Medicare Other | Admitting: Internal Medicine

## 2018-09-03 DIAGNOSIS — J9621 Acute and chronic respiratory failure with hypoxia: Secondary | ICD-10-CM | POA: Diagnosis not present

## 2018-09-03 DIAGNOSIS — N179 Acute kidney failure, unspecified: Secondary | ICD-10-CM | POA: Diagnosis not present

## 2018-09-03 DIAGNOSIS — J690 Pneumonitis due to inhalation of food and vomit: Secondary | ICD-10-CM

## 2018-09-03 DIAGNOSIS — J9611 Chronic respiratory failure with hypoxia: Secondary | ICD-10-CM

## 2018-09-03 LAB — HEPATIC FUNCTION PANEL
ALT: 5 — AB (ref 10–40)
AST: 16 (ref 14–40)
Alkaline Phosphatase: 42 (ref 25–125)
Bilirubin, Total: 0.4

## 2018-09-03 LAB — BASIC METABOLIC PANEL
BUN: 81 — AB (ref 4–21)
Creatinine: 3.6 — AB (ref 0.6–1.3)
Glucose: 220
Potassium: 5.7 — AB (ref 3.4–5.3)
Sodium: 136 — AB (ref 137–147)

## 2018-09-03 LAB — CBC AND DIFFERENTIAL
HCT: 30 — AB (ref 41–53)
Hemoglobin: 9.9 — AB (ref 13.5–17.5)
Platelets: 266 (ref 150–399)
WBC: 8.1

## 2018-09-03 NOTE — Progress Notes (Signed)
Location:  [facility] Room Number: 308-D Place of Service:  SNF (31)  [name], MD  Patient Care Team: [name], MD as PCP - General (Internal Medicine) [name], MD as Consulting Physician (Cardiology) [name], MD as Consulting Physician (Neurology)  Extended Emergency Contact Information Primary Emergency Contact: [name],[name] Address: [address]          [place], [place] [address] [name] of [place] Phone: [phone] Mobile Phone: [phone] Relation: Spouse Secondary Emergency Contact: [name], [name] of [place] Phone: [phone] Mobile Phone: [phone] Relation: Daughter    Allergies: Benzedrine [amphetamine], Dexedrine [dextroamphetamine sulfate er], Oxycodone, and Vancomycin  Chief Complaint  Patient presents with  . Acute Visit    Patient seen for an acute visit due to fever and oxygen desaturation.    HPI: Patient is a 78 y.o. male who is being seen for a fever 101 that went up to 102.5 onset today patient states she saturations 88% on 2 L 90% on 3 L O2 and finally went up to 95 to 96% on 4 L of oxygen patient coughs now when not eating and has rales at his left base.  Patient is not responding well.  Past Medical History:  Diagnosis Date  . 2nd degree AV block    a. s/p STJ dual chamber pacemaker 11/2014  . Acute respiratory failure ([place]) 01/26/2015  . AKI (acute kidney injury) ([place]) 01/26/2015  . AMI, INFERIOR WALL 09/30/2008   Qualifier: Diagnosis of  By: Unk Lightning, RN, BSN, Melanie    . Benign neoplasm of colon     Adenomatous polyps  . Benign prostatic hyperplasia   . Bipolar affective disorder ([place])    . Bipolar depression ([place]) 05/10/2015  . BPH (benign prostatic hypertrophy)   . CAD (coronary artery disease)    a. s/p CABG  . CAD, ARTERY BYPASS GRAFT 11/18/2008   Qualifier: Diagnosis of  By: Angelena Form, MD, Harrell Gave    .  Chronic pain    . CVA (cerebral infarction)     Small left occipital  . Dementia with behavioral disturbance ([place]) 02/15/2015  . Depression    . Diabetic peripheral neuropathy ([place]) 09/28/2011  . DM (diabetes mellitus), type 2 with peripheral vascular complications ([place]) 04/01/4006   Qualifier: Diagnosis of  By: Burnett Kanaris    . Gout    . History of alcohol abuse    . Hyperlipidemia    . HYPERTENSION        . Hypertensive heart disease with heart failure ([place]) 09/01/2008   Qualifier: Diagnosis of  By: Burnett Kanaris    . Insomnia 04/19/2012  . Ischemic cardiomyopathy    . Narcolepsy    . Obesity   . OBESITY 09/01/2008   Qualifier: Diagnosis of  By: Burnett Kanaris    . OBSTRUCTIVE SLEEP APNEA        . Obstructive sleep apnea 09/16/2008   Qualifier: Diagnosis of  By: [name] MD, [name]   . Pacemaker 12/12/2014  . Parkinsonism ([place]) 09/30/2015  . [facility] spotted fever   . SINUS TACHYCARDIA 09/02/2008   Qualifier: Diagnosis of  By: Caryl Comes, MD, [name]   . TIA (transient ischemic attack)    . TOBACCO ABUSE 07/31/2007   Qualifier: Diagnosis of  By: [name] MD, [name]   . Tremor 08/11/2015  . UNIVERSAL ULCERATIVE COLITIS        .  Vitamin D deficiency 01/07/2015    Past Surgical History:  Procedure Laterality Date  . CARDIAC CATHETERIZATION     Triple-vessel coronary artery disease. Low-normal left ventricular systolic function with mild   anteroapical wall motion abnormality.   [name] Kitchen CARDIAC CATHETERIZATION N/A 01/28/2015   Procedure: Temporary Pacemaker;  Surgeon: [name], MD;  Location: [facility];  Service: Cardiovascular;  Laterality: N/A;  . CORONARY ARTERY BYPASS GRAFT     Median sternotomy, extracorporeal circulation,  coronary artery bypass graft surgery x4 using a sequential left internal   mammary artery graft to the mid and distal left anterior descending, a   saphenous vein graft to diagonal branch of the left anterior descending,   and  a saphenous vein graft to the obtuse marginal branch of left   circumflex coronary artery.    . EP IMPLANTABLE DEVICE N/A 12/12/2014   Procedure: Pacemaker Implant;  Surgeon: [name], MD;  Location: [facility];  Service: Cardiovascular;  Laterality: N/A;  . EP IMPLANTABLE DEVICE N/A 01/28/2015   Procedure: PPM Generator Removal;  Surgeon: [name], MD;  Location: [facility];  Service: Cardiovascular;  Laterality: N/A;  . EP IMPLANTABLE DEVICE N/A 03/20/2015   Procedure: Pacemaker Implant;  Surgeon: [name], MD;  Location: [facility];  Service: Cardiovascular;  Laterality: N/A;  . FLEXIBLE SIGMOIDOSCOPY  01/17/2012   Procedure: FLEXIBLE SIGMOIDOSCOPY;  Surgeon: Lafayette Dragon, MD;  Location: [facility];  Service: Endoscopy;  Laterality: N/A;  . MULTIPLE EXTRACTIONS WITH ALVEOLOPLASTY  12/01/2011   Procedure: MULTIPLE EXTRACION WITH ALVEOLOPLASTY;  Surgeon: Lenn Cal, DDS;  Location: [facility];  Service: Oral Surgery;  Laterality: N/A;  Extraction of tooth #'s 3,4,5,6,7,8,9,10,11,12,13,14,15,16,17,20,21,22,23,24,25,26,27,28,29,30,31,32 with alveoloplasty and bilateral mandibular lingual tori  . TONSILECTOMY, ADENOIDECTOMY, BILATERAL MYRINGOTOMY AND TUBES  1948    Allergies as of 09/03/2018      Reactions   Benzedrine [amphetamine] Nausea Only   Dexedrine [dextroamphetamine Sulfate Er] Other (See Comments)   agitation   Oxycodone Nausea And Vomiting   Vancomycin    Unknown - per [facility]      Medication List       Accurate as of September 03, 2018  3:45 PM. If you have any questions, ask your nurse or doctor.        STOP taking these medications   lactose free nutrition Liqd Stopped by: Inocencio Homes, MD     TAKE these medications   aspirin 81 MG chewable tablet Chew 81 mg by mouth at bedtime.   carbidopa-levodopa 25-100 MG tablet Commonly known as: SINEMET IR Take 2 tablets by mouth 3 (three) times daily with meals.   carvedilol 3.125 MG  tablet Commonly known as: COREG Take 3.125 mg by mouth 2 (two) times daily with a meal. Hold for SBP less than 105   clonazePAM 0.5 MG tablet Commonly known as: KLONOPIN Take 1 tablet (0.5 mg total) by mouth at bedtime.   docusate sodium 100 MG capsule Commonly known as: COLACE Take 100 mg by mouth daily as needed for mild constipation.   donepezil 10 MG tablet Commonly known as: ARICEPT Take 10 mg by mouth at bedtime.   doxazosin 1 MG tablet Commonly known as: CARDURA Take 1 mg by mouth at bedtime.   fenofibrate 145 MG tablet Commonly known as: TRICOR Take 145 mg by mouth daily. With a meal   ferrous sulfate 325 (65 FE) MG tablet Take 325 mg by mouth 2 (  two) times daily with a meal. x3 months, ending 10/31/18   Fish Oil 1000 MG Caps Take 1 capsule by mouth 3 (three) times daily.   folic acid 1 MG tablet Commonly known as: FOLVITE Take 1 mg by mouth daily.   furosemide 40 MG tablet Commonly known as: LASIX Take 40 mg by mouth daily.   gabapentin 400 MG capsule Commonly known as: NEURONTIN Take 400 mg by mouth 3 (three) times daily.   HumaLOG KwikPen 100 UNIT/ML KiwkPen Generic drug: insulin lispro Inject 20 Units into the skin 3 (three) times daily. Hold for CBG  < 200. ( to be given in addition to sliding scale) Check FSBS before meals and at bedtime. SSI 201-250 = 4 units, 251-300 = 6 units, 301-350 = 8 units, 351-400 = 10 units   ipratropium-albuterol 0.5-2.5 (3) MG/3ML Soln Commonly known as: DUONEB Take 3 mLs by nebulization every 4 (four) hours as needed (SOB/congestion).   lamoTRIgine 25 MG tablet Commonly known as: LAMICTAL Take 25 mg by mouth daily. give one tab with Lamictal 100 mg to equal 125 mg daily   lamoTRIgine 100 MG tablet Commonly known as: LAMICTAL Take 100 mg by mouth daily. Give 100 mg tablet along with 25 mg tablet to equal 125 mg total   Lexapro 5 MG tablet Generic drug: escitalopram Take 5 mg by mouth daily.   losartan 50 MG  tablet Commonly known as: COZAAR Take 50 mg by mouth every morning.   Magnesium Oxide 500 MG Tabs Take 1 tablet by mouth daily.   metFORMIN 1000 MG tablet Commonly known as: GLUCOPHAGE Take 1,000 mg by mouth 2 (two) times daily.   multivitamin tablet Take 1 tablet by mouth daily.   nitroGLYCERIN 0.4 MG SL tablet Commonly known as: NITROSTAT Place 0.4 mg under the tongue every 5 (five) minutes as needed for chest pain. x3 doses PRN, notify MD if no relief   omeprazole 20 MG tablet Commonly known as: PRILOSEC OTC Take 20 mg by mouth daily.   OXYGEN Inhale 2 L into the lungs continuous. To keep spo2 above 90%   oxymetazoline 0.05 % nasal spray Commonly known as: AFRIN Place 1 spray into both nostrils as needed (Nose bleed).   polyethylene glycol 17 g packet Commonly known as: MIRALAX / GLYCOLAX Take 17 g by mouth daily.   pravastatin 40 MG tablet Commonly known as: PRAVACHOL Take 40 mg by mouth at bedtime.   primidone 50 MG tablet Commonly known as: MYSOLINE Take 25 mg by mouth at bedtime. 0.5 tablet to = 25 mg   promethazine 25 MG tablet Commonly known as: PHENERGAN Take 25 mg by mouth every 6 (six) hours as needed for nausea or vomiting. x3 doses. Notify MD if symptoms persist for more than 24 hours   Skin Prep Wipes Misc Apply to bilateral heels and protective foam dressing every 3 days   sodium chloride 0.9 % infusion Inject 1,000 mLs into the vein See admin instructions. Give via clysis at 50 mL/hr x2 liters for hydration   Toujeo Max SoloStar 300 UNIT/ML Sopn Generic drug: Insulin Glargine Inject 35 Units into the skin at bedtime.   traMADol 50 MG tablet Commonly known as: ULTRAM Take 50 mg by mouth every 8 (eight) hours. Give each shift   traZODone 50 MG tablet Commonly known as: DESYREL Take 25 mg by mouth at bedtime. 0.5 tablet to = 25 mg   vitamin C 500 MG tablet Commonly known as: ASCORBIC ACID Take 1,000 mg  by mouth daily. 2 tablets to = 1000  mg x3 months, ending 10/31/18   Vitamin D3 1.25 MG (50000 UT) Caps Take 1 capsule by mouth once a week.   Zinc Sulfate 220 (50 Zn) MG Tabs Take 1 tablet by mouth daily. x14 days - ending 09/05/18       No orders of the defined types were placed in this encounter.   Immunization History  Administered Date(s) Administered  . Influenza-Unspecified 10/28/2016, 10/07/2017  . PPD Test 03/20/2013  . Pneumococcal Conjugate-13 10/09/2015  . Pneumococcal Polysaccharide-23 09/02/2014    Social History   Tobacco Use  . Smoking status: Former Smoker    Packs/day: 2.00    Years: 50.00    Pack years: 100.00    Quit date: 08/18/2014    Years since quitting: 4.0  . Smokeless tobacco: Former Network engineer Use Topics  . Alcohol use: No    Comment: quit 2003    Review of Systems      unable to obtain from patient; per nursing as per history of present illness    Vitals:   09/03/18 1524  BP: 121/69  Pulse: 61  Resp: 16  Temp: 98.9 F (37.2 C)  SpO2: 92%   Body mass index is 38.67 kg/m. Physical Exam  GENERAL APPEARANCE: Decreased alertness and confusion1, No acute distress  SKIN: No diaphoresis rash HEENT: Unremarkable RESPIRATORY: Breathing is even, unlabored. Lung sounds are Rales left base CARDIOVASCULAR: Heart RRR no murmurs, rubs or gallops. No peripheral edema  GASTROINTESTINAL: Abdomen is soft, non-tender, not distended w/ normal bowel sounds.  GENITOURINARY: Bladder non tender, not distended  MUSCULOSKELETAL: No abnormal joints or musculature NEUROLOGIC: Cranial nerves 2-12 grossly intact. Moves all extremities PSYCHIATRIC: Patient is sleepy, no behavioral issues  Patient Active Problem List   Diagnosis Date Noted  . Cough 09/01/2018  . Real time reverse transcriptase PCR positive for COVID-19 virus 08/27/2018  . Infection requiring airborne isolation precautions 08/27/2018  . Low folate 08/09/2018  . Cutaneous abscess of back (any part, except buttock)  07/15/2018  . Pneumonia 07/10/2018  . Acute renal failure (ARF) ([place]) 07/10/2018  . GERD without esophagitis 05/03/2018  . Dysphagia, oropharyngeal 05/03/2018  . Hypertensive heart and renal disease, stage 1-4 or unspecified chronic kidney disease, with heart failure ([place]) 04/25/2018  . Chronic respiratory failure with hypoxia ([place]) 04/25/2018  . Type 2 diabetes mellitus with stage 3 chronic kidney disease and hypertension ([place]) 04/25/2018  . Dyslipidemia associated with type 2 diabetes mellitus ([place]) 04/25/2018  . Chronic constipation 04/25/2018  . Essential tremor 04/25/2018  . CKD stage 3 due to type 2 diabetes mellitus ([place]) 04/25/2018  . Peripheral vascular disease ([place]) 02/04/2017  . Controlled diabetes mellitus type 2 with complications ([place]) 12/87/8676  . Aspiration pneumonia ([place]) 09/30/2015  . Parkinsonism ([place]) 09/30/2015  . Tremor 08/11/2015  . Suicidal ideation 05/10/2015  . Bipolar depression ([place]) 05/10/2015  . Complete heart block ([place]) 03/20/2015  . Bradycardia 03/14/2015  . Dementia with behavioral disturbance ([place]) 02/15/2015  . Staphylococcus aureus bacteremia with sepsis ([place]) 01/28/2015  . Infection of pacemaker pocket ([place]) 01/28/2015  . Acute respiratory failure ([place]) 01/26/2015  . AKI (acute kidney injury) ([place]) 01/26/2015  . Acute encephalopathy 01/26/2015  . Altered mental status   . Vitamin D deficiency 01/07/2015  . Mobitz type 2 second degree atrioventricular block 12/12/2014  . Pacemaker 12/12/2014  . Symptomatic bradycardia 12/03/2014  . Lower back pain 06/12/2012  . Right hip pain 06/12/2012  . Intertrigo  05/09/2012  . Insomnia 04/19/2012  . General weakness 03/07/2012  . 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 Diarrhea 01/17/2012  . Pyelonephritis 01/15/2012  . Diabetic peripheral neuropathy ([place]) 09/28/2011  . Urinary incontinence 09/28/2011  . Preventative health care 09/23/2011  . Gout 09/23/2011  . Bipolar affective  disorder ([place]) 09/23/2011  . TIA (transient ischemic attack) 09/23/2011  . Chronic pain 09/23/2011  . DNR (do not resuscitate) 09/23/2011  . Cerebral infarction ([place]) 09/23/2011  . 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 History of alcohol abuse 09/23/2011  . Benign prostatic hyperplasia   . Chronic systolic CHF (congestive heart failure) ([place]) 05/05/2011  . Edema 01/03/2011  . CAD (coronary artery disease) 05/18/2010  . Hyperlipidemia 05/18/2010  . Other abnormality of urination(788.69) 11/25/2009  . Other specified forms of chronic ischemic heart disease 05/19/2009  . Coronary artery disease involving coronary bypass graft of native heart with angina pectoris ([place]) 11/18/2008  . AMI, INFERIOR WALL 09/30/2008  . Obstructive sleep apnea 09/16/2008  . SINUS TACHYCARDIA 09/02/2008  . DM (diabetes mellitus), type 2 with peripheral vascular complications ([place]) 78/67/5449  . OBESITY 09/01/2008  . Hypertensive heart disease with heart failure ([place]) 09/01/2008  . TOBACCO ABUSE 07/31/2007  . 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    CMP     Component Value Date/Time   NA 140 08/22/2018   K 5.3 08/22/2018   CL 102 03/20/2015 1038   CO2 28 03/20/2015 1038   GLUCOSE 171 (H) 03/20/2015 1038   BUN 49 (A) 08/22/2018   CREATININE 2.1 (A) 08/22/2018   CREATININE 1.23 03/20/2015 1038   CREATININE 1.06 12/05/2014 1536   CALCIUM 9.6 03/20/2015 1038   PROT 5.8 (L) 02/01/2015 0245   ALBUMIN 2.2 (L) 02/01/2015 0245   AST 17 02/11/2017   ALT 6 (A) 02/11/2017   ALKPHOS 21 (A) 02/11/2017   BILITOT 0.4 02/01/2015 0245   GFRNONAA 55 (L) 03/20/2015 1038   GFRAA >60 03/20/2015 1038   Recent Labs    08/13/18 08/15/18 08/22/18  NA 140 141 140  K 4.8 4.6 5.3  BUN 64* 48* 49*  CREATININE 2.3* 1.7* 2.1*   No results for input(s): AST, ALT, ALKPHOS, BILITOT, PROT, ALBUMIN in the last 8760 hours. Recent Labs    08/06/18 09/03/18  WBC 6.2 8.1  NEUTROABS 5  --   HGB 9.3* 9.9*  HCT 28* 30*  PLT 244 266    Recent Labs    01/23/18 07/31/18  CHOL 165 120  LDLCALC 63 45  TRIG 311* 148   Lab Results  Component Value Date   MICROALBUR >42.6 04/09/2016   Lab Results  Component Value Date   TSH 1.47 01/23/2018   Lab Results  Component Value Date   HGBA1C 7.6 01/23/2018   Lab Results  Component Value Date   CHOL 120 07/31/2018   HDL 45 07/31/2018   LDLCALC 45 07/31/2018   LDLDIRECT 85.2 06/13/2012   TRIG 148 07/31/2018   CHOLHDL 7 06/13/2012    Significant Diagnostic Results in last 30 days:  No results found.  Assessment and Plan  Aspiration pneumonia/acute respiratory failure with hypoxia/acute renal failure- chest x-ray returned showing cardiomegaly and nothing else, as a matter clinically patient has pneumonia; patient requires renal dosing with a calculated creatinine clearance of 23, will be using Augmentin 500 mg every 12 IV; labs have returned with a BUN of 81 and a creatinine of 3.63 with a potassium of 5.7 so patient is being started on IV fluid normal saline at 75  cc an hour which should decrease the potassium; patient cannot take p.o.'s at the moment    [name], MD

## 2018-09-04 ENCOUNTER — Non-Acute Institutional Stay (SKILLED_NURSING_FACILITY): Payer: Medicare Other | Admitting: Internal Medicine

## 2018-09-04 ENCOUNTER — Encounter: Payer: Self-pay | Admitting: Internal Medicine

## 2018-09-04 DIAGNOSIS — J690 Pneumonitis due to inhalation of food and vomit: Secondary | ICD-10-CM

## 2018-09-04 DIAGNOSIS — N179 Acute kidney failure, unspecified: Secondary | ICD-10-CM | POA: Diagnosis not present

## 2018-09-04 NOTE — Progress Notes (Signed)
Location:  [facility] Room Number: 308-D Place of Service:  SNF (31)  [name], MD  Patient Care Team: [name], MD as PCP - General (Internal Medicine) [name], MD as Consulting Physician (Cardiology) [name], MD as Consulting Physician (Neurology)  Extended Emergency Contact Information Primary Emergency Contact: [name],[name] Address: [address]          [place], [place] [address] [name] of [place] Phone: [phone] Mobile Phone: [phone] Relation: Spouse Secondary Emergency Contact: [name],  [name] of [place] Phone: [phone] Mobile Phone: [phone] Relation: Daughter    Allergies: Benzedrine [amphetamine], Dexedrine [dextroamphetamine sulfate er], Oxycodone, and Vancomycin  Chief Complaint  Patient presents with  . Acute Visit    Patient is seen to followup on acute renal failure.     HPI: Patient is a 78 y.o. male who I am following up on from yesterday.  Patient has received some IV fluid and he is away and spoke to me is that as I walked in the room.  He makes sense, he understands looks much better.  Past Medical History:  Diagnosis Date  . 2nd degree AV block    a. s/p STJ dual chamber pacemaker 11/2014  . Acute respiratory failure ([place]) 01/26/2015  . AKI (acute kidney injury) ([place]) 01/26/2015  . AMI, INFERIOR WALL 09/30/2008   Qualifier: Diagnosis of  By: Unk Lightning, RN, BSN, Melanie    . Benign neoplasm of colon     Adenomatous polyps  . Benign prostatic hyperplasia   . Bipolar affective disorder ([place])    . Bipolar depression ([place]) 05/10/2015  . BPH (benign prostatic hypertrophy)   . CAD (coronary artery disease)    a. s/p CABG  . CAD, ARTERY BYPASS GRAFT 11/18/2008   Qualifier: Diagnosis of  By: Angelena Form, MD, Harrell Gave    . Chronic pain    . CVA (cerebral infarction)     Small left occipital  . Dementia with behavioral  disturbance ([place]) 02/15/2015  . Depression    . Diabetic peripheral neuropathy ([place]) 09/28/2011  . DM (diabetes mellitus), type 2 with peripheral vascular complications ([place]) 9/41/7408   Qualifier: Diagnosis of  By: Burnett Kanaris    . Gout    . History of alcohol abuse    . Hyperlipidemia    . HYPERTENSION        . Hypertensive heart disease with heart failure ([place]) 09/01/2008   Qualifier: Diagnosis of  By: Burnett Kanaris    . Insomnia 04/19/2012  . Ischemic cardiomyopathy    . Narcolepsy    . Obesity   . OBESITY 09/01/2008   Qualifier: Diagnosis of  By: Burnett Kanaris    . OBSTRUCTIVE SLEEP APNEA        . Obstructive sleep apnea 09/16/2008   Qualifier: Diagnosis of  By: [name] MD, [name]   . Pacemaker 12/12/2014  . Parkinsonism ([place]) 09/30/2015  . [facility] spotted fever   . SINUS TACHYCARDIA 09/02/2008   Qualifier: Diagnosis of  By: Caryl Comes, MD, [name]   . TIA (transient ischemic attack)    . TOBACCO ABUSE 07/31/2007   Qualifier: Diagnosis of  By: [name] MD, [name]   . Tremor 08/11/2015  . UNIVERSAL ULCERATIVE COLITIS        . Vitamin D deficiency 01/07/2015    Past Surgical History:  Procedure Laterality Date  . CARDIAC CATHETERIZATION  Triple-vessel coronary artery disease. Low-normal left ventricular systolic function with mild   anteroapical wall motion abnormality.   [name] Kitchen CARDIAC CATHETERIZATION N/A 01/28/2015   Procedure: Temporary Pacemaker;  Surgeon: [name], MD;  Location: [place] [facility] CV LAB;  Service: Cardiovascular;  Laterality: N/A;  . CORONARY ARTERY BYPASS GRAFT     Median sternotomy, extracorporeal circulation,  coronary artery bypass graft surgery x4 using a sequential left internal   mammary artery graft to the mid and distal left anterior descending, a   saphenous vein graft to diagonal branch of the left anterior descending,   and a saphenous vein graft to the obtuse marginal branch of left   circumflex coronary artery.    . EP  IMPLANTABLE DEVICE N/A 12/12/2014   Procedure: Pacemaker Implant;  Surgeon: [name], MD;  Location: [facility];  Service: Cardiovascular;  Laterality: N/A;  . EP IMPLANTABLE DEVICE N/A 01/28/2015   Procedure: PPM Generator Removal;  Surgeon: [name], MD;  Location: [facility] LAB;  Service: Cardiovascular;  Laterality: N/A;  . EP IMPLANTABLE DEVICE N/A 03/20/2015   Procedure: Pacemaker Implant;  Surgeon: [name], MD;  Location: [place] [facility] LAB;  Service: Cardiovascular;  Laterality: N/A;  . FLEXIBLE SIGMOIDOSCOPY  01/17/2012   Procedure: FLEXIBLE SIGMOIDOSCOPY;  Surgeon: Lafayette Dragon, MD;  Location: [facility];  Service: Endoscopy;  Laterality: N/A;  . MULTIPLE EXTRACTIONS WITH ALVEOLOPLASTY  12/01/2011   Procedure: MULTIPLE EXTRACION WITH ALVEOLOPLASTY;  Surgeon: Lenn Cal, DDS;  Location: [facility];  Service: Oral Surgery;  Laterality: N/A;  Extraction of tooth #'s 3,4,5,6,7,8,9,10,11,12,13,14,15,16,17,20,21,22,23,24,25,26,27,28,29,30,31,32 with alveoloplasty and bilateral mandibular lingual tori  . TONSILECTOMY, ADENOIDECTOMY, BILATERAL MYRINGOTOMY AND TUBES  1948    Allergies as of 09/04/2018      Reactions   Benzedrine [amphetamine] Nausea Only   Dexedrine [dextroamphetamine Sulfate Er] Other (See Comments)   agitation   Oxycodone Nausea And Vomiting   Vancomycin    Unknown - per [facility]      Medication List       Accurate as of September 04, 2018  3:16 PM. If you have any questions, ask your nurse or doctor.        aspirin 81 MG chewable tablet Chew 81 mg by mouth at bedtime.   carbidopa-levodopa 25-100 MG tablet Commonly known as: SINEMET IR Take 2 tablets by mouth 3 (three) times daily with meals.   carvedilol 3.125 MG tablet Commonly known as: COREG Take 3.125 mg by mouth 2 (two) times daily with a meal. Hold for SBP less than 105   clonazePAM 0.5 MG tablet Commonly known as: KLONOPIN Take 1 tablet (0.5 mg total) by mouth at bedtime.    docusate sodium 100 MG capsule Commonly known as: COLACE Take 100 mg by mouth daily as needed for mild constipation.   donepezil 10 MG tablet Commonly known as: ARICEPT Take 10 mg by mouth at bedtime.   doxazosin 1 MG tablet Commonly known as: CARDURA Take 1 mg by mouth at bedtime.   fenofibrate 145 MG tablet Commonly known as: TRICOR Take 145 mg by mouth daily. With a meal   ferrous sulfate 325 (65 FE) MG tablet Take 325 mg by mouth 2 (two) times daily with a meal. x3 months, ending 10/31/18   Fish Oil 1000 MG Caps Take 1 capsule by mouth 3 (three) times daily.   folic acid 1 MG tablet Commonly known as: FOLVITE Take 1 mg by mouth daily.   furosemide  40 MG tablet Commonly known as: LASIX Take 40 mg by mouth daily.   gabapentin 400 MG capsule Commonly known as: NEURONTIN Take 400 mg by mouth 3 (three) times daily.   HumaLOG KwikPen 100 UNIT/ML KiwkPen Generic drug: insulin lispro Inject 20 Units into the skin 3 (three) times daily. Hold for CBG  < 200. ( to be given in addition to sliding scale) Check FSBS before meals and at bedtime. SSI 201-250 = 4 units, 251-300 = 6 units, 301-350 = 8 units, 351-400 = 10 units   ipratropium-albuterol 0.5-2.5 (3) MG/3ML Soln Commonly known as: DUONEB Take 3 mLs by nebulization every 4 (four) hours as needed (SOB/congestion).   lamoTRIgine 25 MG tablet Commonly known as: LAMICTAL Take 25 mg by mouth daily. give one tab with Lamictal 100 mg to equal 125 mg daily   lamoTRIgine 100 MG tablet Commonly known as: LAMICTAL Take 100 mg by mouth daily. Give 100 mg tablet along with 25 mg tablet to equal 125 mg total   levofloxacin 500 MG/100ML Soln Commonly known as: LEVAQUIN Inject 500 mg into the vein once.   Levofloxacin 250 MG/50ML Soln Commonly known as: LEVAQUIN Inject 250 mg into the vein daily. x6 doses   Lexapro 5 MG tablet Generic drug: escitalopram Take 5 mg by mouth daily.   losartan 50 MG tablet Commonly known  as: COZAAR Take 50 mg by mouth every morning.   Magnesium Oxide 500 MG Tabs Take 1 tablet by mouth daily.   metFORMIN 1000 MG tablet Commonly known as: GLUCOPHAGE Take 1,000 mg by mouth 2 (two) times daily.   multivitamin tablet Take 1 tablet by mouth daily.   nitroGLYCERIN 0.4 MG SL tablet Commonly known as: NITROSTAT Place 0.4 mg under the tongue every 5 (five) minutes as needed for chest pain. x3 doses PRN, notify MD if no relief   omeprazole 20 MG tablet Commonly known as: PRILOSEC OTC Take 20 mg by mouth daily.   OXYGEN Inhale 2 L into the lungs continuous. To keep spo2 above 90%   oxymetazoline 0.05 % nasal spray Commonly known as: AFRIN Place 1 spray into both nostrils as needed (Nose bleed).   polyethylene glycol 17 g packet Commonly known as: MIRALAX / GLYCOLAX Take 17 g by mouth daily.   pravastatin 40 MG tablet Commonly known as: PRAVACHOL Take 40 mg by mouth at bedtime.   primidone 50 MG tablet Commonly known as: MYSOLINE Take 25 mg by mouth at bedtime. 0.5 tablet to = 25 mg   promethazine 25 MG tablet Commonly known as: PHENERGAN Take 25 mg by mouth every 6 (six) hours as needed for nausea or vomiting. x3 doses. Notify MD if symptoms persist for more than 24 hours   Skin Prep Wipes Misc Apply to bilateral heels and protective foam dressing every 3 days   sodium chloride 0.9 % infusion Inject 1,000 mLs into the vein once. 75 ml/hr   sodium chloride 0.9 % infusion Inject 1,000 mLs into the vein See admin instructions. Give via clysis at 50 mL/hr x2 liters for hydration   Toujeo Max SoloStar 300 UNIT/ML Sopn Generic drug: Insulin Glargine Inject 35 Units into the skin at bedtime.   traMADol 50 MG tablet Commonly known as: ULTRAM Take 50 mg by mouth every 8 (eight) hours. Give each shift   traZODone 50 MG tablet Commonly known as: DESYREL Take 25 mg by mouth at bedtime. 0.5 tablet to = 25 mg   vitamin C 500 MG tablet Commonly known  as:  ASCORBIC ACID Take 1,000 mg by mouth daily. 2 tablets to = 1000 mg x3 months, ending 10/31/18   Vitamin D3 1.25 MG (50000 UT) Caps Take 1 capsule by mouth once a week.   Zinc Sulfate 220 (50 Zn) MG Tabs Take 1 tablet by mouth daily. x14 days - ending 09/05/18       No orders of the defined types were placed in this encounter.   Immunization History  Administered Date(s) Administered  . Influenza-Unspecified 10/28/2016, 10/07/2017  . PPD Test 03/20/2013  . Pneumococcal Conjugate-13 10/09/2015  . Pneumococcal Polysaccharide-23 09/02/2014    Social History   Tobacco Use  . Smoking status: Former Smoker    Packs/day: 2.00    Years: 50.00    Pack years: 100.00    Quit date: 08/18/2014    Years since quitting: 4.0  . Smokeless tobacco: Former Network engineer Use Topics  . Alcohol use: No    Comment: quit 2003    Review of Systems  DATA OBTAINED: from patient-limited; nursing- patient improved today, mental status is better GENERAL:  no fevers, fatigue, appetite changes SKIN: No itching, rash HEENT: No complaint RESPIRATORY: No cough, wheezing, SOB CARDIAC: No chest pain, palpitations, lower extremity edema  GI: No abdominal pain, No N/V/D or constipation, No heartburn or reflux  GU: No dysuria, frequency or urgency, or incontinence  MUSCULOSKELETAL: No unrelieved bone/joint pain NEUROLOGIC: No headache, dizziness  PSYCHIATRIC: No overt anxiety or sadness  Vitals:   09/04/18 1458  BP: 129/63  Pulse: 66  Resp: 20  Temp: (!) 101.6 F (38.7 C)  SpO2: 96%   Body mass index is 38.67 kg/m. Physical Exam  GENERAL APPEARANCE: Alert, conversant, minimally but is speaking, No acute distress  SKIN: No diaphoresis rash HEENT: Unremarkable RESPIRATORY: Breathing is even, unlabored. Lung sounds are Rales still on left base CARDIOVASCULAR: Heart RRR no murmurs, rubs or gallops. No peripheral edema  GASTROINTESTINAL: Abdomen is soft, non-tender, not distended w/ normal  bowel sounds.  GENITOURINARY: Bladder non tender, not distended  MUSCULOSKELETAL: No abnormal joints or musculature NEUROLOGIC: Cranial nerves 2-12 grossly intact. Moves all extremities PSYCHIATRIC: Mood and affect dementia, no behavioral issues  Patient Active Problem List   Diagnosis Date Noted  . Cough 09/01/2018  . Real time reverse transcriptase PCR positive for COVID-19 virus 08/27/2018  . Infection requiring airborne isolation precautions 08/27/2018  . Low folate 08/09/2018  . Cutaneous abscess of back (any part, except buttock) 07/15/2018  . Pneumonia 07/10/2018  . Acute renal failure (ARF) ([place]) 07/10/2018  . GERD without esophagitis 05/03/2018  . Dysphagia, oropharyngeal 05/03/2018  . Hypertensive heart and renal disease, stage 1-4 or unspecified chronic kidney disease, with heart failure ([place]) 04/25/2018  . Chronic respiratory failure with hypoxia ([place]) 04/25/2018  . Type 2 diabetes mellitus with stage 3 chronic kidney disease and hypertension ([place]) 04/25/2018  . Dyslipidemia associated with type 2 diabetes mellitus ([place]) 04/25/2018  . Chronic constipation 04/25/2018  . Essential tremor 04/25/2018  . CKD stage 3 due to type 2 diabetes mellitus ([place]) 04/25/2018  . Peripheral vascular disease ([place]) 02/04/2017  . Controlled diabetes mellitus type 2 with complications ([place]) 02/77/4128  . Aspiration pneumonia ([place]) 09/30/2015  . Parkinsonism ([place]) 09/30/2015  . Tremor 08/11/2015  . Suicidal ideation 05/10/2015  . Bipolar depression ([place]) 05/10/2015  . Complete heart block ([place]) 03/20/2015  . Bradycardia 03/14/2015  . Dementia with behavioral disturbance ([place]) 02/15/2015  . Staphylococcus aureus bacteremia with sepsis ([place]) 01/28/2015  . Infection  of pacemaker pocket ([place]) 01/28/2015  . Acute respiratory failure ([place]) 01/26/2015  . AKI (acute kidney injury) ([place]) 01/26/2015  . Acute encephalopathy 01/26/2015  . Altered mental status   . Vitamin D deficiency 01/07/2015  .  Mobitz type 2 second degree atrioventricular block 12/12/2014  . Pacemaker 12/12/2014  . Symptomatic bradycardia 12/03/2014  . Lower back pain 06/12/2012  . Right hip pain 06/12/2012  . Intertrigo 05/09/2012  . Insomnia 04/19/2012  . General weakness 03/07/2012  . 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 Diarrhea 01/17/2012  . Pyelonephritis 01/15/2012  . Diabetic peripheral neuropathy ([place]) 09/28/2011  . Urinary incontinence 09/28/2011  . Preventative health care 09/23/2011  . Gout 09/23/2011  . Bipolar affective disorder ([place]) 09/23/2011  . TIA (transient ischemic attack) 09/23/2011  . Chronic pain 09/23/2011  . DNR (do not resuscitate) 09/23/2011  . Cerebral infarction ([place]) 09/23/2011  . 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 History of alcohol abuse 09/23/2011  . Benign prostatic hyperplasia   . Chronic systolic CHF (congestive heart failure) ([place]) 05/05/2011  . Edema 01/03/2011  . CAD (coronary artery disease) 05/18/2010  . Hyperlipidemia 05/18/2010  . Other abnormality of urination(788.69) 11/25/2009  . Other specified forms of chronic ischemic heart disease 05/19/2009  . Coronary artery disease involving coronary bypass graft of native heart with angina pectoris ([place]) 11/18/2008  . AMI, INFERIOR WALL 09/30/2008  . Obstructive sleep apnea 09/16/2008  . SINUS TACHYCARDIA 09/02/2008  . DM (diabetes mellitus), type 2 with peripheral vascular complications ([place]) 78/46/9629  . OBESITY 09/01/2008  . Hypertensive heart disease with heart failure ([place]) 09/01/2008  . TOBACCO ABUSE 07/31/2007  . 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    CMP     Component Value Date/Time   NA 136 (A) 09/03/2018   K 5.7 (A) 09/03/2018   CL 102 03/20/2015 1038   CO2 28 03/20/2015 1038   GLUCOSE 171 (H) 03/20/2015 1038   BUN 81 (A) 09/03/2018   CREATININE 3.6 (A) 09/03/2018   CREATININE 1.23 03/20/2015 1038   CREATININE 1.06 12/05/2014 1536   CALCIUM 9.6 03/20/2015 1038   PROT  5.8 (L) 02/01/2015 0245   ALBUMIN 2.2 (L) 02/01/2015 0245   AST 16 09/03/2018   ALT 5 (A) 09/03/2018   ALKPHOS 42 09/03/2018   BILITOT 0.4 02/01/2015 0245   GFRNONAA 55 (L) 03/20/2015 1038   GFRAA >60 03/20/2015 1038   Recent Labs    08/15/18 08/22/18 09/03/18  NA 141 140 136*  K 4.6 5.3 5.7*  BUN 48* 49* 81*  CREATININE 1.7* 2.1* 3.6*   Recent Labs    09/03/18  AST 16  ALT 5*  ALKPHOS 42   Recent Labs    08/06/18 09/03/18  WBC 6.2 8.1  NEUTROABS 5  --   HGB 9.3* 9.9*  HCT 28* 30*  PLT 244 266   Recent Labs    01/23/18 07/31/18  CHOL 165 120  LDLCALC 63 45  TRIG 311* 148   Lab Results  Component Value Date   MICROALBUR >42.6 04/09/2016   Lab Results  Component Value Date   TSH 1.47 01/23/2018   Lab Results  Component Value Date   HGBA1C 7.6 01/23/2018   Lab Results  Component Value Date   CHOL 120 07/31/2018   HDL 45 07/31/2018   LDLCALC 45 07/31/2018   LDLDIRECT 85.2 06/13/2012   TRIG 148 07/31/2018   CHOLHDL 7 06/13/2012    Significant Diagnostic Results in last 30 days:  No results found.  Assessment and Plan  Pneumonia/acute renal failure- patient is improved with IV fluids; continue antibiotics, continue IV fluids, BMP is ordered for the morning; expect an improvement; continue supportive care     [name], MD

## 2018-09-05 LAB — BASIC METABOLIC PANEL
BUN: 91 — AB (ref 4–21)
Creatinine: 3.8 — AB (ref 0.6–1.3)
Glucose: 122
Potassium: 5.3 (ref 3.4–5.3)
Sodium: 140 (ref 137–147)

## 2018-09-05 LAB — CBC AND DIFFERENTIAL
HCT: 25 — AB (ref 41–53)
Hemoglobin: 8.3 — AB (ref 13.5–17.5)
Neutrophils Absolute: 3
Platelets: 278 (ref 150–399)
WBC: 5.2

## 2018-09-06 LAB — CBC AND DIFFERENTIAL
HCT: 29 — AB (ref 41–53)
Hemoglobin: 9.1 — AB (ref 13.5–17.5)
Neutrophils Absolute: 4
Platelets: 250 (ref 150–399)
WBC: 5.4

## 2018-09-06 LAB — BASIC METABOLIC PANEL
BUN: 93 — AB (ref 4–21)
Creatinine: 3.7 — AB (ref 0.6–1.3)
Glucose: 132
Potassium: 5.1 (ref 3.4–5.3)
Sodium: 137 (ref 137–147)

## 2018-09-07 ENCOUNTER — Non-Acute Institutional Stay (SKILLED_NURSING_FACILITY): Payer: Medicare Other | Admitting: Internal Medicine

## 2018-09-07 ENCOUNTER — Encounter: Payer: Self-pay | Admitting: Internal Medicine

## 2018-09-07 DIAGNOSIS — J690 Pneumonitis due to inhalation of food and vomit: Secondary | ICD-10-CM | POA: Diagnosis not present

## 2018-09-07 DIAGNOSIS — Z7189 Other specified counseling: Secondary | ICD-10-CM

## 2018-09-07 DIAGNOSIS — R4702 Dysphasia: Secondary | ICD-10-CM

## 2018-09-07 DIAGNOSIS — T17908S Unspecified foreign body in respiratory tract, part unspecified causing other injury, sequela: Secondary | ICD-10-CM | POA: Diagnosis not present

## 2018-09-07 LAB — CBC AND DIFFERENTIAL
HCT: 27 — AB (ref 41–53)
Hemoglobin: 8.6 — AB (ref 13.5–17.5)
Neutrophils Absolute: 4
Platelets: 258 (ref 150–399)
WBC: 6.3

## 2018-09-07 NOTE — Progress Notes (Signed)
Location:  [facility] Room Number: 308-D Place of Service:  SNF (31)  [name], MD  Patient Care Team: [name], MD as PCP - General (Internal Medicine) [name], MD as Consulting Physician (Cardiology) [name], MD as Consulting Physician (Neurology)  Extended Emergency Contact Information Primary Emergency Contact: [name],[name] Address: [address]          [place], [place] [address] [name] of [place] Phone: [phone] Mobile Phone: [phone] Relation: Spouse Secondary Emergency Contact: [name], [name] of [place] Phone: [phone] Mobile Phone: [phone] Relation: Daughter    Allergies: Benzedrine [amphetamine], Dexedrine [dextroamphetamine sulfate er], Oxycodone, and Vancomycin  Chief Complaint  Patient presents with  . Acute Visit    HPI: Patient is a 78 y.o. male who who is being seen to discuss end-of-life care.  Prior the [employer] midlevel had called me to tell me the patient's BUN and creatinine had not improved at all with IV fluids.  She had discussed frankly with the patient's wife and the patient what this meant.  Neither party once a feeding to so there is only one ultimate outcome to this.  Yesterday patient's wife signed a most form but the social worker has some concern as to whether the wife understood exactly what it meant to have no IV fluids and to have no antibiotics so I spoke with her today at length about the entire situation and what everything meant.  Now I am speaking to the patient about that conversation and about her wishes per that conversation and what his wishes per the conversation would be as well.  Past Medical History:  Diagnosis Date  . 2nd degree AV block    a. s/p STJ dual chamber pacemaker 11/2014  . Acute respiratory failure ([place]) 01/26/2015  . AKI (acute kidney injury) ([place]) 01/26/2015  . AMI, INFERIOR WALL  09/30/2008   Qualifier: Diagnosis of  By: Unk Lightning, RN, BSN, Melanie    . Benign neoplasm of colon     Adenomatous polyps  . Benign prostatic hyperplasia   . Bipolar affective disorder ([place])    . Bipolar depression ([place]) 05/10/2015  . BPH (benign prostatic hypertrophy)   . CAD (coronary artery disease)    a. s/p CABG  . CAD, ARTERY BYPASS GRAFT 11/18/2008   Qualifier: Diagnosis of  By: Angelena Form, MD, Harrell Gave    . Chronic pain    . CVA (cerebral infarction)     Small left occipital  . Dementia with behavioral disturbance ([place]) 02/15/2015  . Depression    . Diabetic peripheral neuropathy ([place]) 09/28/2011  . DM (diabetes mellitus), type 2 with peripheral vascular complications ([place]) [id number]   Qualifier: Diagnosis of  By: Burnett Kanaris    . Gout    . History of alcohol abuse    . Hyperlipidemia    . HYPERTENSION        . Hypertensive heart disease with heart failure ([place]) 09/01/2008   Qualifier: Diagnosis of  By: Burnett Kanaris    . Insomnia 04/19/2012  . Ischemic cardiomyopathy    . Narcolepsy    . Obesity   . OBESITY 09/01/2008   Qualifier: Diagnosis of  By: Burnett Kanaris    . OBSTRUCTIVE SLEEP APNEA        . Obstructive sleep apnea 09/16/2008   Qualifier: Diagnosis of  By: [name] MD, [name]   . Pacemaker 12/12/2014  .  Parkinsonism ([place]) 09/30/2015  . [facility] spotted fever   . SINUS TACHYCARDIA 09/02/2008   Qualifier: Diagnosis of  By: Caryl Comes, MD, [name]   . TIA (transient ischemic attack)    . TOBACCO ABUSE 07/31/2007   Qualifier: Diagnosis of  By: [name] MD, [name]   . Tremor 08/11/2015  . UNIVERSAL ULCERATIVE COLITIS        . Vitamin D deficiency 01/07/2015    Past Surgical History:  Procedure Laterality Date  . CARDIAC CATHETERIZATION     Triple-vessel coronary artery disease. Low-normal left ventricular systolic function with mild   anteroapical wall motion abnormality.   [name] Kitchen CARDIAC CATHETERIZATION N/A 01/28/2015   Procedure: Temporary  Pacemaker;  Surgeon: [name], MD;  Location: [facility];  Service: Cardiovascular;  Laterality: N/A;  . CORONARY ARTERY BYPASS GRAFT     Median sternotomy, extracorporeal circulation,  coronary artery bypass graft surgery x4 using a sequential left internal   mammary artery graft to the mid and distal left anterior descending, a   saphenous vein graft to diagonal branch of the left anterior descending,   and a saphenous vein graft to the obtuse marginal branch of left   circumflex coronary artery.    . EP IMPLANTABLE DEVICE N/A 12/12/2014   Procedure: Pacemaker Implant;  Surgeon: [name], MD;  Location: [facility];  Service: Cardiovascular;  Laterality: N/A;  . EP IMPLANTABLE DEVICE N/A 01/28/2015   Procedure: PPM Generator Removal;  Surgeon: [name], MD;  Location: [facility];  Service: Cardiovascular;  Laterality: N/A;  . EP IMPLANTABLE DEVICE N/A 03/20/2015   Procedure: Pacemaker Implant;  Surgeon: [name], MD;  Location: [facility];  Service: Cardiovascular;  Laterality: N/A;  . FLEXIBLE SIGMOIDOSCOPY  01/17/2012   Procedure: FLEXIBLE SIGMOIDOSCOPY;  Surgeon: Lafayette Dragon, MD;  Location: [facility];  Service: Endoscopy;  Laterality: N/A;  . MULTIPLE EXTRACTIONS WITH ALVEOLOPLASTY  12/01/2011   Procedure: MULTIPLE EXTRACION WITH ALVEOLOPLASTY;  Surgeon: Lenn Cal, DDS;  Location: [facility];  Service: Oral Surgery;  Laterality: N/A;  Extraction of tooth #'s 3,4,5,6,7,8,9,10,11,12,13,14,15,16,17,20,21,22,23,24,25,26,27,28,29,30,31,32 with alveoloplasty and bilateral mandibular lingual tori  . TONSILECTOMY, ADENOIDECTOMY, BILATERAL MYRINGOTOMY AND TUBES  1948    Allergies as of 09/07/2018      Reactions   Benzedrine [amphetamine] Nausea Only   Dexedrine [dextroamphetamine Sulfate Er] Other (See Comments)   agitation   Oxycodone Nausea And Vomiting   Vancomycin    Unknown - per [facility]      Medication List       Accurate as of  September 07, 2018 11:59 PM. If you have any questions, ask your nurse or doctor.        STOP taking these medications   Levofloxacin 250 MG/50ML Soln Commonly known as: LEVAQUIN Stopped by: Inocencio Homes, MD   levofloxacin 500 MG/100ML Soln Commonly known as: LEVAQUIN Stopped by: Inocencio Homes, MD   losartan 50 MG tablet Commonly known as: COZAAR Stopped by: Inocencio Homes, MD     TAKE these medications   aspirin 81 MG chewable tablet Chew 81 mg by mouth at bedtime.   Augmentin 500-125 MG tablet Generic drug: amoxicillin-clavulanate Take 1 tablet by mouth every 12 (twelve) hours. x7 days   carbidopa-levodopa 25-100 MG tablet Commonly known as: SINEMET IR Take 2 tablets by mouth 3 (three) times daily with meals.   carvedilol 3.125 MG tablet Commonly known as: COREG Take 3.125 mg by mouth  2 (two) times daily with a meal. Hold for SBP less than 105   clonazePAM 0.5 MG tablet Commonly known as: KLONOPIN Take 1 tablet (0.5 mg total) by mouth at bedtime.   docusate sodium 100 MG capsule Commonly known as: COLACE Take 100 mg by mouth daily as needed for mild constipation.   donepezil 10 MG tablet Commonly known as: ARICEPT Take 10 mg by mouth at bedtime.   doxazosin 1 MG tablet Commonly known as: CARDURA Take 1 mg by mouth at bedtime.   fenofibrate 145 MG tablet Commonly known as: TRICOR Take 145 mg by mouth daily. With a meal   ferrous sulfate 325 (65 FE) MG tablet Take 325 mg by mouth 2 (two) times daily with a meal. x3 months, ending 10/31/18   Fish Oil 1000 MG Caps Take 1 capsule by mouth 3 (three) times daily.   folic acid 1 MG tablet Commonly known as: FOLVITE Take 1 mg by mouth daily.   furosemide 40 MG tablet Commonly known as: LASIX Take 40 mg by mouth daily.   gabapentin 400 MG capsule Commonly known as: NEURONTIN Take 400 mg by mouth 3 (three) times daily.   HumaLOG KwikPen 100 UNIT/ML KiwkPen Generic drug: insulin lispro Inject 20 Units  into the skin 3 (three) times daily. Hold for CBG  < 200. ( to be given in addition to sliding scale) Check FSBS before meals and at bedtime. SSI 201-250 = 4 units, 251-300 = 6 units, 301-350 = 8 units, 351-400 = 10 units   ipratropium-albuterol 0.5-2.5 (3) MG/3ML Soln Commonly known as: DUONEB Take 3 mLs by nebulization every 4 (four) hours as needed (SOB/congestion).   lamoTRIgine 25 MG tablet Commonly known as: LAMICTAL Take 25 mg by mouth daily. give one tab with Lamictal 100 mg to equal 125 mg daily   lamoTRIgine 100 MG tablet Commonly known as: LAMICTAL Take 100 mg by mouth daily. Give 100 mg tablet along with 25 mg tablet to equal 125 mg total   Lexapro 5 MG tablet Generic drug: escitalopram Take 5 mg by mouth daily.   Magnesium Oxide 500 MG Tabs Take 1 tablet by mouth daily.   metFORMIN 1000 MG tablet Commonly known as: GLUCOPHAGE Take 1,000 mg by mouth 2 (two) times daily.   multivitamin tablet Take 1 tablet by mouth daily.   nitroGLYCERIN 0.4 MG SL tablet Commonly known as: NITROSTAT Place 0.4 mg under the tongue every 5 (five) minutes as needed for chest pain. x3 doses PRN, notify MD if no relief   omeprazole 20 MG tablet Commonly known as: PRILOSEC OTC Take 20 mg by mouth daily.   OXYGEN Inhale 2 L into the lungs continuous. To keep spo2 above 90%   oxymetazoline 0.05 % nasal spray Commonly known as: AFRIN Place 1 spray into both nostrils as needed (Nose bleed).   polyethylene glycol 17 g packet Commonly known as: MIRALAX / GLYCOLAX Take 17 g by mouth daily.   pravastatin 40 MG tablet Commonly known as: PRAVACHOL Take 40 mg by mouth at bedtime.   primidone 50 MG tablet Commonly known as: MYSOLINE Take 25 mg by mouth at bedtime. 0.5 tablet to = 25 mg   promethazine 25 MG tablet Commonly known as: PHENERGAN Take 25 mg by mouth every 6 (six) hours as needed for nausea or vomiting. x3 doses. Notify MD if symptoms persist for more than 24 hours    Skin Prep Wipes Misc Apply to bilateral heels and protective foam dressing every 3  days   sodium chloride 0.9 % infusion Inject 1,000 mLs into the vein continuous. 100 ml/hr What changed: Another medication with the same name was removed. Continue taking this medication, and follow the directions you see here. Changed by: Inocencio Homes, MD   Nelva Nay Max SoloStar 300 UNIT/ML Sopn Generic drug: Insulin Glargine Inject 35 Units into the skin at bedtime.   traMADol 50 MG tablet Commonly known as: ULTRAM Take 50 mg by mouth every 8 (eight) hours. Give each shift   traZODone 50 MG tablet Commonly known as: DESYREL Take 25 mg by mouth at bedtime. 0.5 tablet to = 25 mg   vitamin C 500 MG tablet Commonly known as: ASCORBIC ACID Take 1,000 mg by mouth daily. 2 tablets to = 1000 mg x3 months, ending 10/31/18   Vitamin D3 1.25 MG (50000 UT) Caps Take 1 capsule by mouth once a week.       No orders of the defined types were placed in this encounter.   Immunization History  Administered Date(s) Administered  . Influenza-Unspecified 10/28/2016, 10/07/2017  . PPD Test 03/20/2013  . Pneumococcal Conjugate-13 10/09/2015  . Pneumococcal Polysaccharide-23 09/02/2014    Social History   Tobacco Use  . Smoking status: Former Smoker    Packs/day: 2.00    Years: 50.00    Pack years: 100.00    Quit date: 08/18/2014    Years since quitting: 4.0  . Smokeless tobacco: Former Network engineer Use Topics  . Alcohol use: No    Comment: quit 2003    Review of Systems  DATA OBTAINED: from patient, nurse, medical record, family member GENERAL:  no fevers, fatigue, appetite changes SKIN: No itching, rash HEENT: No complaint RESPIRATORY: No cough, wheezing, SOB CARDIAC: No chest pain, palpitations, lower extremity edema  GI: No abdominal pain, No N/V/D or constipation, No heartburn or reflux  GU: No dysuria, frequency or urgency, or incontinence  MUSCULOSKELETAL: No unrelieved bone/joint  pain NEUROLOGIC: No headache, dizziness  PSYCHIATRIC: No overt anxiety or sadness  Vitals:   09/07/18 1139  BP: 101/65  Pulse: 78  Resp: 18  Temp: 98.2 F (36.8 C)  SpO2: 96%   Body mass index is 37.69 kg/m. Physical Exam  GENERAL APPEARANCE: Alert, conversant, No acute distress  SKIN: No diaphoresis rash HEENT: Unremarkable RESPIRATORY: Breathing is even, unlabored. Lung sounds are clear   CARDIOVASCULAR: Heart RRR no murmurs, rubs or gallops.  Some upper extremity peripheral edema  GASTROINTESTINAL: Abdomen is soft, non-tender, not distended w/ normal bowel sounds.  GENITOURINARY: Bladder non tender, not distended  MUSCULOSKELETAL: No abnormal joints or musculature NEUROLOGIC: Cranial nerves 2-12 grossly intact. Moves all extremities PSYCHIATRIC: Mood and affect appropriate to situation, no behavioral issues  Patient Active Problem List   Diagnosis Date Noted  . Cough 09/01/2018  . Real time reverse transcriptase PCR positive for COVID-19 virus 08/27/2018  . Infection requiring airborne isolation precautions 08/27/2018  . Low folate 08/09/2018  . Cutaneous abscess of back (any part, except buttock) 07/15/2018  . Pneumonia 07/10/2018  . Acute renal failure (ARF) ([place]) 07/10/2018  . GERD without esophagitis 05/03/2018  . Dysphagia, oropharyngeal 05/03/2018  . Hypertensive heart and renal disease, stage 1-4 or unspecified chronic kidney disease, with heart failure ([place]) 04/25/2018  . Chronic respiratory failure with hypoxia ([place]) 04/25/2018  . Type 2 diabetes mellitus with stage 3 chronic kidney disease and hypertension ([place]) 04/25/2018  . Dyslipidemia associated with type 2 diabetes mellitus ([place]) 04/25/2018  . Chronic constipation 04/25/2018  .  Essential tremor 04/25/2018  . CKD stage 3 due to type 2 diabetes mellitus ([place]) 04/25/2018  . Peripheral vascular disease ([place]) 02/04/2017  . Controlled diabetes mellitus type 2 with complications ([place]) XX123456  .  Aspiration pneumonia ([place]) 09/30/2015  . Parkinsonism ([place]) 09/30/2015  . Tremor 08/11/2015  . Suicidal ideation 05/10/2015  . Bipolar depression ([place]) 05/10/2015  . Complete heart block ([place]) 03/20/2015  . Bradycardia 03/14/2015  . Dementia with behavioral disturbance ([place]) 02/15/2015  . Staphylococcus aureus bacteremia with sepsis ([place]) 01/28/2015  . Infection of pacemaker pocket ([place]) 01/28/2015  . Acute on chronic respiratory failure with hypoxia ([place]) 01/26/2015  . AKI (acute kidney injury) ([place]) 01/26/2015  . Acute encephalopathy 01/26/2015  . Altered mental status   . Vitamin D deficiency 01/07/2015  . Mobitz type 2 second degree atrioventricular block 12/12/2014  . Pacemaker 12/12/2014  . Symptomatic bradycardia 12/03/2014  . Lower back pain 06/12/2012  . Right hip pain 06/12/2012  . Intertrigo 05/09/2012  . Insomnia 04/19/2012  . General weakness 03/07/2012  . 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 Diarrhea 01/17/2012  . Pyelonephritis 01/15/2012  . Diabetic peripheral neuropathy ([place]) 09/28/2011  . Urinary incontinence 09/28/2011  . Preventative health care 09/23/2011  . Gout 09/23/2011  . Bipolar affective disorder ([place]) 09/23/2011  . TIA (transient ischemic attack) 09/23/2011  . Chronic pain 09/23/2011  . DNR (do not resuscitate) 09/23/2011  . Cerebral infarction ([place]) 09/23/2011  . 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 History of alcohol abuse 09/23/2011  . Benign prostatic hyperplasia   . Chronic systolic CHF (congestive heart failure) ([place]) 05/05/2011  . Edema 01/03/2011  . CAD (coronary artery disease) 05/18/2010  . Hyperlipidemia 05/18/2010  . Other abnormality of urination(788.69) 11/25/2009  . Other specified forms of chronic ischemic heart disease 05/19/2009  . Coronary artery disease involving coronary bypass graft of native heart with angina pectoris ([place]) 11/18/2008  . AMI, INFERIOR WALL 09/30/2008  . Obstructive sleep apnea 09/16/2008  .  SINUS TACHYCARDIA 09/02/2008  . DM (diabetes mellitus), type 2 with peripheral vascular complications ([place]) [id number]  . OBESITY 09/01/2008  . Hypertensive heart disease with heart failure ([place]) 09/01/2008  . TOBACCO ABUSE 07/31/2007  . 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    CMP     Component Value Date/Time   NA 137 09/06/2018   K 5.1 09/06/2018   CL 102 03/20/2015 1038   CO2 28 03/20/2015 1038   GLUCOSE 171 (H) 03/20/2015 1038   BUN 93 (A) 09/06/2018   CREATININE 3.7 (A) 09/06/2018   CREATININE 1.23 03/20/2015 1038   CREATININE 1.06 12/05/2014 1536   CALCIUM 9.6 03/20/2015 1038   PROT 5.8 (L) 02/01/2015 0245   ALBUMIN 2.2 (L) 02/01/2015 0245   AST 16 09/03/2018   ALT 5 (A) 09/03/2018   ALKPHOS 42 09/03/2018   BILITOT 0.4 02/01/2015 0245   GFRNONAA 55 (L) 03/20/2015 1038   GFRAA >60 03/20/2015 1038   Recent Labs    09/03/18 09/05/18 09/06/18  NA 136* 140 137  K 5.7* 5.3 5.1  BUN 81* 91* 93*  CREATININE 3.6* 3.8* 3.7*   Recent Labs    09/03/18  AST 16  ALT 5*  ALKPHOS 42   Recent Labs    09/05/18 09/06/18 09/07/18  WBC 5.2 5.4 6.3  NEUTROABS 3 4 4   HGB 8.3* 9.1* 8.6*  HCT 25* 29* 27*  PLT 278 250 258   Recent Labs    01/23/18 07/31/18  CHOL 165 120  LDLCALC 63 45  TRIG  311* 148   Lab Results  Component Value Date   MICROALBUR >42.6 04/09/2016   Lab Results  Component Value Date   TSH 1.47 01/23/2018   Lab Results  Component Value Date   HGBA1C 7.6 01/23/2018   Lab Results  Component Value Date   CHOL 120 07/31/2018   HDL 45 07/31/2018   LDLCALC 45 07/31/2018   LDLDIRECT 85.2 06/13/2012   TRIG 148 07/31/2018   CHOLHDL 7 06/13/2012    Significant Diagnostic Results in last 30 days:  No results found.  Assessment and Plan  Dysphagia with aspiration/aspiration pneumonia/acute renal failure/discussion for end-of-life care- I called patient's wife and discussed with her which no antibiotics and no IV fluids remained her husband  we discussed the past week at length and explained in detail patient's death would probably require she was actually comforted by this.  I explained to her what morphine does for patient and while we use it and it seemed to come for her validated her decision because I think but she and her husband are thinking about each other with their decision making.  Per the wife she desires to stop the IV fluids and the IV antibiotics starting now.  When I spoke to the patient he agreed.  He and his wife have been married for 8 years, and he smiled and said "I really love her".  We will continue supportive care, comfort care, and have concentrated morphine use if patient has respiratory distress or anxiety.    Time spent greater than 60 minutes; greater than 30 minutes spent discussing end-of-life care and several particulars on the MOST form [name], MD

## 2018-09-08 ENCOUNTER — Encounter: Payer: Self-pay | Admitting: Internal Medicine

## 2018-09-09 ENCOUNTER — Encounter: Payer: Self-pay | Admitting: Internal Medicine

## 2018-10-02 ENCOUNTER — Encounter: Payer: Self-pay | Admitting: Internal Medicine

## 2018-10-02 NOTE — Progress Notes (Signed)
This encounter was created in error - please disregard.

## 2018-10-18 DEATH — deceased
